# Patient Record
Sex: Female | Born: 1935 | Race: White | Hispanic: No | Smoking: Never smoker
Health system: Southern US, Community
[De-identification: ages and names within clinical notes are randomized; demographics above are authoritative.]

## PROBLEM LIST (undated history)

## (undated) DIAGNOSIS — H9190 Unspecified hearing loss, unspecified ear: Secondary | ICD-10-CM

## (undated) DIAGNOSIS — M1710 Unilateral primary osteoarthritis, unspecified knee: Secondary | ICD-10-CM

## (undated) DIAGNOSIS — IMO0002 Reserved for concepts with insufficient information to code with codable children: Secondary | ICD-10-CM

## (undated) DIAGNOSIS — G5600 Carpal tunnel syndrome, unspecified upper limb: Secondary | ICD-10-CM

## (undated) DIAGNOSIS — J4489 Other specified chronic obstructive pulmonary disease: Secondary | ICD-10-CM

## (undated) DIAGNOSIS — E039 Hypothyroidism, unspecified: Secondary | ICD-10-CM

## (undated) DIAGNOSIS — I5032 Chronic diastolic (congestive) heart failure: Secondary | ICD-10-CM

## (undated) DIAGNOSIS — S82402A Unspecified fracture of shaft of left fibula, initial encounter for closed fracture: Secondary | ICD-10-CM

## (undated) DIAGNOSIS — I10 Essential (primary) hypertension: Secondary | ICD-10-CM

## (undated) DIAGNOSIS — F0390 Unspecified dementia without behavioral disturbance: Secondary | ICD-10-CM

## (undated) DIAGNOSIS — K219 Gastro-esophageal reflux disease without esophagitis: Secondary | ICD-10-CM

## (undated) DIAGNOSIS — I495 Sick sinus syndrome: Secondary | ICD-10-CM

## (undated) DIAGNOSIS — I2510 Atherosclerotic heart disease of native coronary artery without angina pectoris: Secondary | ICD-10-CM

## (undated) DIAGNOSIS — I679 Cerebrovascular disease, unspecified: Secondary | ICD-10-CM

## (undated) DIAGNOSIS — E538 Deficiency of other specified B group vitamins: Secondary | ICD-10-CM

## (undated) DIAGNOSIS — G4730 Sleep apnea, unspecified: Secondary | ICD-10-CM

## (undated) DIAGNOSIS — G629 Polyneuropathy, unspecified: Secondary | ICD-10-CM

## (undated) DIAGNOSIS — F411 Generalized anxiety disorder: Secondary | ICD-10-CM

## (undated) DIAGNOSIS — E785 Hyperlipidemia, unspecified: Secondary | ICD-10-CM

## (undated) DIAGNOSIS — J449 Chronic obstructive pulmonary disease, unspecified: Secondary | ICD-10-CM

## (undated) DIAGNOSIS — Z950 Presence of cardiac pacemaker: Secondary | ICD-10-CM

## (undated) HISTORY — DX: Sleep apnea, unspecified: G47.30

## (undated) HISTORY — PX: HEMORRHOID SURGERY: SHX153

## (undated) HISTORY — DX: Essential (primary) hypertension: I10

## (undated) HISTORY — DX: Hyperlipidemia, unspecified: E78.5

## (undated) HISTORY — DX: Cerebrovascular disease, unspecified: I67.9

## (undated) HISTORY — PX: BLADDER REPAIR: SHX76

## (undated) HISTORY — DX: Chronic diastolic (congestive) heart failure: I50.32

## (undated) HISTORY — DX: Hypothyroidism, unspecified: E03.9

## (undated) HISTORY — DX: Unilateral primary osteoarthritis, unspecified knee: M17.10

## (undated) HISTORY — DX: Atherosclerotic heart disease of native coronary artery without angina pectoris: I25.10

## (undated) HISTORY — DX: Reserved for concepts with insufficient information to code with codable children: IMO0002

## (undated) HISTORY — DX: Chronic obstructive pulmonary disease, unspecified: J44.9

## (undated) HISTORY — DX: Gastro-esophageal reflux disease without esophagitis: K21.9

## (undated) HISTORY — DX: Polyneuropathy, unspecified: G62.9

## (undated) HISTORY — PX: TOTAL KNEE ARTHROPLASTY: SHX125

## (undated) HISTORY — PX: ABDOMINAL HYSTERECTOMY: SHX81

## (undated) HISTORY — PX: NASAL FRACTURE SURGERY: SHX718

## (undated) HISTORY — PX: CARPAL TUNNEL RELEASE: SHX101

## (undated) HISTORY — DX: Deficiency of other specified B group vitamins: E53.8

## (undated) HISTORY — DX: Generalized anxiety disorder: F41.1

## (undated) HISTORY — DX: Other specified chronic obstructive pulmonary disease: J44.89

## (undated) HISTORY — DX: Unspecified fracture of shaft of left fibula, initial encounter for closed fracture: S82.402A

## (undated) HISTORY — DX: Carpal tunnel syndrome, unspecified upper limb: G56.00

## (undated) HISTORY — PX: CHOLECYSTECTOMY: SHX55

## (undated) HISTORY — DX: Sick sinus syndrome: I49.5

---

## 1978-06-11 HISTORY — PX: THYROIDECTOMY: SHX17

## 1998-02-16 ENCOUNTER — Inpatient Hospital Stay (HOSPITAL_COMMUNITY): Admission: EM | Admit: 1998-02-16 | Discharge: 1998-02-17 | Payer: Self-pay | Admitting: Emergency Medicine

## 2000-10-08 ENCOUNTER — Ambulatory Visit (HOSPITAL_COMMUNITY): Admission: RE | Admit: 2000-10-08 | Discharge: 2000-10-08 | Payer: Self-pay | Admitting: Cardiology

## 2000-11-26 ENCOUNTER — Ambulatory Visit (HOSPITAL_COMMUNITY): Admission: RE | Admit: 2000-11-26 | Discharge: 2000-11-26 | Payer: Self-pay | Admitting: Internal Medicine

## 2000-11-26 ENCOUNTER — Encounter: Payer: Self-pay | Admitting: Internal Medicine

## 2001-03-26 ENCOUNTER — Encounter: Admission: RE | Admit: 2001-03-26 | Discharge: 2001-04-09 | Payer: Self-pay | Admitting: *Deleted

## 2001-04-01 ENCOUNTER — Encounter (HOSPITAL_COMMUNITY): Admission: RE | Admit: 2001-04-01 | Discharge: 2001-05-01 | Payer: Self-pay | Admitting: Internal Medicine

## 2001-04-09 ENCOUNTER — Ambulatory Visit (HOSPITAL_COMMUNITY): Admission: RE | Admit: 2001-04-09 | Discharge: 2001-04-09 | Payer: Self-pay | Admitting: Cardiology

## 2001-04-15 ENCOUNTER — Encounter: Admission: RE | Admit: 2001-04-15 | Discharge: 2001-04-23 | Payer: Self-pay | Admitting: *Deleted

## 2001-06-03 ENCOUNTER — Encounter: Payer: Self-pay | Admitting: Emergency Medicine

## 2001-06-03 ENCOUNTER — Emergency Department (HOSPITAL_COMMUNITY): Admission: EM | Admit: 2001-06-03 | Discharge: 2001-06-03 | Payer: Self-pay | Admitting: Emergency Medicine

## 2001-06-10 ENCOUNTER — Ambulatory Visit (HOSPITAL_COMMUNITY): Admission: RE | Admit: 2001-06-10 | Discharge: 2001-06-10 | Payer: Self-pay | Admitting: Internal Medicine

## 2001-06-10 ENCOUNTER — Encounter: Payer: Self-pay | Admitting: Internal Medicine

## 2001-06-11 DIAGNOSIS — I495 Sick sinus syndrome: Secondary | ICD-10-CM

## 2001-06-11 HISTORY — DX: Sick sinus syndrome: I49.5

## 2001-06-11 HISTORY — PX: CARDIAC PACEMAKER PLACEMENT: SHX583

## 2001-08-05 ENCOUNTER — Ambulatory Visit (HOSPITAL_COMMUNITY): Admission: RE | Admit: 2001-08-05 | Discharge: 2001-08-05 | Payer: Self-pay | Admitting: Internal Medicine

## 2001-10-13 ENCOUNTER — Encounter: Payer: Self-pay | Admitting: Internal Medicine

## 2001-10-13 ENCOUNTER — Ambulatory Visit (HOSPITAL_COMMUNITY): Admission: RE | Admit: 2001-10-13 | Discharge: 2001-10-13 | Payer: Self-pay | Admitting: Internal Medicine

## 2001-12-24 ENCOUNTER — Encounter: Payer: Self-pay | Admitting: Internal Medicine

## 2001-12-24 ENCOUNTER — Ambulatory Visit (HOSPITAL_COMMUNITY): Admission: RE | Admit: 2001-12-24 | Discharge: 2001-12-25 | Payer: Self-pay | Admitting: *Deleted

## 2001-12-25 ENCOUNTER — Encounter: Payer: Self-pay | Admitting: Internal Medicine

## 2002-03-23 ENCOUNTER — Encounter: Payer: Self-pay | Admitting: Family Medicine

## 2002-03-23 ENCOUNTER — Ambulatory Visit (HOSPITAL_COMMUNITY): Admission: RE | Admit: 2002-03-23 | Discharge: 2002-03-23 | Payer: Self-pay | Admitting: Family Medicine

## 2002-06-24 ENCOUNTER — Encounter: Payer: Self-pay | Admitting: Internal Medicine

## 2002-06-24 ENCOUNTER — Ambulatory Visit (HOSPITAL_COMMUNITY): Admission: RE | Admit: 2002-06-24 | Discharge: 2002-06-24 | Payer: Self-pay | Admitting: Internal Medicine

## 2002-11-17 ENCOUNTER — Emergency Department (HOSPITAL_COMMUNITY): Admission: EM | Admit: 2002-11-17 | Discharge: 2002-11-17 | Payer: Self-pay | Admitting: Emergency Medicine

## 2003-01-28 ENCOUNTER — Encounter: Payer: Self-pay | Admitting: Family Medicine

## 2003-01-28 ENCOUNTER — Ambulatory Visit (HOSPITAL_COMMUNITY): Admission: RE | Admit: 2003-01-28 | Discharge: 2003-01-28 | Payer: Self-pay | Admitting: *Deleted

## 2003-02-19 ENCOUNTER — Ambulatory Visit (HOSPITAL_COMMUNITY): Admission: RE | Admit: 2003-02-19 | Discharge: 2003-02-19 | Payer: Self-pay | Admitting: Family Medicine

## 2003-02-19 ENCOUNTER — Encounter: Payer: Self-pay | Admitting: Family Medicine

## 2003-03-10 ENCOUNTER — Ambulatory Visit (HOSPITAL_COMMUNITY): Admission: RE | Admit: 2003-03-10 | Discharge: 2003-03-10 | Payer: Self-pay | Admitting: Internal Medicine

## 2003-08-16 ENCOUNTER — Emergency Department (HOSPITAL_COMMUNITY): Admission: EM | Admit: 2003-08-16 | Discharge: 2003-08-16 | Payer: Self-pay | Admitting: Emergency Medicine

## 2003-08-19 ENCOUNTER — Ambulatory Visit (HOSPITAL_COMMUNITY): Admission: RE | Admit: 2003-08-19 | Discharge: 2003-08-19 | Payer: Self-pay | Admitting: Family Medicine

## 2003-08-23 ENCOUNTER — Encounter (HOSPITAL_COMMUNITY): Admission: RE | Admit: 2003-08-23 | Discharge: 2003-09-22 | Payer: Self-pay | Admitting: Family Medicine

## 2003-10-19 ENCOUNTER — Ambulatory Visit (HOSPITAL_COMMUNITY): Admission: RE | Admit: 2003-10-19 | Discharge: 2003-10-19 | Payer: Self-pay | Admitting: Internal Medicine

## 2004-05-01 ENCOUNTER — Ambulatory Visit: Payer: Self-pay

## 2004-05-29 ENCOUNTER — Ambulatory Visit (HOSPITAL_COMMUNITY): Admission: RE | Admit: 2004-05-29 | Discharge: 2004-05-29 | Payer: Self-pay | Admitting: Internal Medicine

## 2004-07-02 ENCOUNTER — Emergency Department (HOSPITAL_COMMUNITY): Admission: EM | Admit: 2004-07-02 | Discharge: 2004-07-02 | Payer: Self-pay | Admitting: Emergency Medicine

## 2004-09-12 ENCOUNTER — Ambulatory Visit: Payer: Self-pay | Admitting: Internal Medicine

## 2004-11-06 ENCOUNTER — Ambulatory Visit (HOSPITAL_COMMUNITY): Admission: RE | Admit: 2004-11-06 | Discharge: 2004-11-06 | Payer: Self-pay | Admitting: Internal Medicine

## 2004-11-08 ENCOUNTER — Ambulatory Visit (HOSPITAL_COMMUNITY): Admission: RE | Admit: 2004-11-08 | Discharge: 2004-11-08 | Payer: Self-pay | Admitting: Internal Medicine

## 2004-12-28 ENCOUNTER — Ambulatory Visit: Payer: Self-pay | Admitting: Cardiology

## 2005-01-16 ENCOUNTER — Ambulatory Visit: Payer: Self-pay | Admitting: *Deleted

## 2005-02-07 ENCOUNTER — Emergency Department (HOSPITAL_COMMUNITY): Admission: EM | Admit: 2005-02-07 | Discharge: 2005-02-07 | Payer: Self-pay | Admitting: Emergency Medicine

## 2005-03-01 ENCOUNTER — Ambulatory Visit: Payer: Self-pay | Admitting: Cardiology

## 2005-03-30 ENCOUNTER — Ambulatory Visit (HOSPITAL_COMMUNITY): Admission: RE | Admit: 2005-03-30 | Discharge: 2005-03-30 | Payer: Self-pay | Admitting: Internal Medicine

## 2005-07-09 ENCOUNTER — Ambulatory Visit (HOSPITAL_COMMUNITY): Admission: RE | Admit: 2005-07-09 | Discharge: 2005-07-09 | Payer: Self-pay | Admitting: Internal Medicine

## 2005-07-30 ENCOUNTER — Ambulatory Visit (HOSPITAL_COMMUNITY): Admission: RE | Admit: 2005-07-30 | Discharge: 2005-07-30 | Payer: Self-pay | Admitting: Internal Medicine

## 2005-10-18 ENCOUNTER — Ambulatory Visit: Payer: Self-pay | Admitting: Cardiology

## 2005-10-25 ENCOUNTER — Ambulatory Visit (HOSPITAL_COMMUNITY): Admission: RE | Admit: 2005-10-25 | Discharge: 2005-10-25 | Payer: Self-pay | Admitting: Internal Medicine

## 2005-11-28 ENCOUNTER — Ambulatory Visit: Payer: Self-pay | Admitting: Orthopedic Surgery

## 2005-12-27 ENCOUNTER — Ambulatory Visit: Payer: Self-pay | Admitting: Orthopedic Surgery

## 2006-02-06 ENCOUNTER — Ambulatory Visit: Payer: Self-pay | Admitting: Orthopedic Surgery

## 2006-02-19 ENCOUNTER — Ambulatory Visit: Payer: Self-pay | Admitting: Internal Medicine

## 2006-04-23 ENCOUNTER — Ambulatory Visit (HOSPITAL_COMMUNITY): Admission: RE | Admit: 2006-04-23 | Discharge: 2006-04-23 | Payer: Self-pay | Admitting: Internal Medicine

## 2006-05-15 ENCOUNTER — Ambulatory Visit: Payer: Self-pay | Admitting: Internal Medicine

## 2006-06-10 ENCOUNTER — Ambulatory Visit: Payer: Self-pay | Admitting: Orthopedic Surgery

## 2006-06-11 HISTORY — PX: COLONOSCOPY: SHX174

## 2006-07-15 ENCOUNTER — Ambulatory Visit (HOSPITAL_COMMUNITY): Admission: RE | Admit: 2006-07-15 | Discharge: 2006-07-15 | Payer: Self-pay | Admitting: Internal Medicine

## 2006-08-14 ENCOUNTER — Ambulatory Visit: Payer: Self-pay | Admitting: Internal Medicine

## 2006-12-16 ENCOUNTER — Emergency Department (HOSPITAL_COMMUNITY): Admission: EM | Admit: 2006-12-16 | Discharge: 2006-12-16 | Payer: Self-pay | Admitting: Emergency Medicine

## 2006-12-17 ENCOUNTER — Ambulatory Visit: Payer: Self-pay | Admitting: Orthopedic Surgery

## 2006-12-25 ENCOUNTER — Ambulatory Visit: Payer: Self-pay | Admitting: Orthopedic Surgery

## 2007-03-11 ENCOUNTER — Ambulatory Visit: Payer: Self-pay | Admitting: Internal Medicine

## 2007-05-13 ENCOUNTER — Ambulatory Visit: Payer: Self-pay | Admitting: Gastroenterology

## 2007-05-26 ENCOUNTER — Ambulatory Visit (HOSPITAL_COMMUNITY): Admission: RE | Admit: 2007-05-26 | Discharge: 2007-05-26 | Payer: Self-pay | Admitting: Gastroenterology

## 2007-05-26 ENCOUNTER — Ambulatory Visit: Payer: Self-pay | Admitting: Gastroenterology

## 2007-05-26 ENCOUNTER — Encounter: Payer: Self-pay | Admitting: Gastroenterology

## 2007-05-30 ENCOUNTER — Ambulatory Visit: Payer: Self-pay | Admitting: Gastroenterology

## 2007-06-11 ENCOUNTER — Ambulatory Visit: Payer: Self-pay | Admitting: Internal Medicine

## 2007-07-01 ENCOUNTER — Ambulatory Visit: Payer: Self-pay | Admitting: Orthopedic Surgery

## 2007-07-01 DIAGNOSIS — M7580 Other shoulder lesions, unspecified shoulder: Secondary | ICD-10-CM

## 2007-07-01 DIAGNOSIS — M25819 Other specified joint disorders, unspecified shoulder: Secondary | ICD-10-CM | POA: Insufficient documentation

## 2007-07-03 ENCOUNTER — Ambulatory Visit: Payer: Self-pay | Admitting: Gastroenterology

## 2007-07-04 ENCOUNTER — Ambulatory Visit (HOSPITAL_COMMUNITY): Admission: RE | Admit: 2007-07-04 | Discharge: 2007-07-04 | Payer: Self-pay | Admitting: Gastroenterology

## 2007-07-08 ENCOUNTER — Ambulatory Visit: Payer: Self-pay | Admitting: Cardiology

## 2007-07-22 ENCOUNTER — Ambulatory Visit (HOSPITAL_COMMUNITY): Admission: RE | Admit: 2007-07-22 | Discharge: 2007-07-22 | Payer: Self-pay | Admitting: Internal Medicine

## 2007-08-20 ENCOUNTER — Encounter (HOSPITAL_COMMUNITY): Admission: RE | Admit: 2007-08-20 | Discharge: 2007-09-19 | Payer: Self-pay | Admitting: Internal Medicine

## 2007-09-18 ENCOUNTER — Ambulatory Visit: Payer: Self-pay | Admitting: Internal Medicine

## 2007-09-22 ENCOUNTER — Ambulatory Visit: Admission: RE | Admit: 2007-09-22 | Discharge: 2007-09-22 | Payer: Self-pay | Admitting: Internal Medicine

## 2007-10-03 ENCOUNTER — Ambulatory Visit: Payer: Self-pay | Admitting: Internal Medicine

## 2007-11-25 ENCOUNTER — Encounter: Payer: Self-pay | Admitting: Internal Medicine

## 2007-12-17 ENCOUNTER — Ambulatory Visit: Payer: Self-pay | Admitting: Internal Medicine

## 2008-03-03 ENCOUNTER — Ambulatory Visit: Payer: Self-pay

## 2008-06-02 ENCOUNTER — Ambulatory Visit: Payer: Self-pay | Admitting: Internal Medicine

## 2008-07-12 ENCOUNTER — Ambulatory Visit: Payer: Self-pay | Admitting: Cardiology

## 2008-07-21 ENCOUNTER — Encounter (INDEPENDENT_AMBULATORY_CARE_PROVIDER_SITE_OTHER): Payer: Self-pay | Admitting: *Deleted

## 2008-07-21 LAB — CONVERTED CEMR LAB
Albumin: 4.4 g/dL
Alkaline Phosphatase: 83 units/L
BUN: 14 mg/dL
Calcium: 9.7 mg/dL
Cholesterol: 167 mg/dL
Creatinine, Ser: 0.81 mg/dL
HDL: 46 mg/dL
Triglycerides: 232 mg/dL

## 2008-07-23 ENCOUNTER — Ambulatory Visit (HOSPITAL_COMMUNITY): Admission: RE | Admit: 2008-07-23 | Discharge: 2008-07-23 | Payer: Self-pay | Admitting: Internal Medicine

## 2008-07-23 ENCOUNTER — Ambulatory Visit: Payer: Self-pay | Admitting: Cardiology

## 2008-07-23 ENCOUNTER — Encounter (HOSPITAL_COMMUNITY): Admission: RE | Admit: 2008-07-23 | Discharge: 2008-08-22 | Payer: Self-pay | Admitting: Cardiology

## 2008-08-30 ENCOUNTER — Ambulatory Visit (HOSPITAL_COMMUNITY): Admission: RE | Admit: 2008-08-30 | Discharge: 2008-08-30 | Payer: Self-pay | Admitting: Family Medicine

## 2008-09-01 ENCOUNTER — Ambulatory Visit: Payer: Self-pay | Admitting: Internal Medicine

## 2008-09-24 ENCOUNTER — Encounter (INDEPENDENT_AMBULATORY_CARE_PROVIDER_SITE_OTHER): Payer: Self-pay

## 2008-10-04 ENCOUNTER — Encounter: Payer: Self-pay | Admitting: Gastroenterology

## 2008-10-14 ENCOUNTER — Emergency Department (HOSPITAL_COMMUNITY): Admission: EM | Admit: 2008-10-14 | Discharge: 2008-10-14 | Payer: Self-pay | Admitting: Emergency Medicine

## 2008-12-07 ENCOUNTER — Encounter (INDEPENDENT_AMBULATORY_CARE_PROVIDER_SITE_OTHER): Payer: Self-pay | Admitting: *Deleted

## 2008-12-08 ENCOUNTER — Encounter: Payer: Self-pay | Admitting: Internal Medicine

## 2008-12-15 ENCOUNTER — Encounter: Payer: Self-pay | Admitting: Internal Medicine

## 2008-12-15 ENCOUNTER — Ambulatory Visit: Payer: Self-pay | Admitting: Internal Medicine

## 2008-12-15 DIAGNOSIS — Z950 Presence of cardiac pacemaker: Secondary | ICD-10-CM | POA: Insufficient documentation

## 2008-12-15 DIAGNOSIS — I5032 Chronic diastolic (congestive) heart failure: Secondary | ICD-10-CM | POA: Insufficient documentation

## 2009-01-17 ENCOUNTER — Encounter: Payer: Self-pay | Admitting: Gastroenterology

## 2009-02-17 DIAGNOSIS — K589 Irritable bowel syndrome without diarrhea: Secondary | ICD-10-CM | POA: Insufficient documentation

## 2009-02-17 DIAGNOSIS — E039 Hypothyroidism, unspecified: Secondary | ICD-10-CM | POA: Insufficient documentation

## 2009-02-17 DIAGNOSIS — J449 Chronic obstructive pulmonary disease, unspecified: Secondary | ICD-10-CM | POA: Insufficient documentation

## 2009-02-17 DIAGNOSIS — Z8719 Personal history of other diseases of the digestive system: Secondary | ICD-10-CM | POA: Insufficient documentation

## 2009-02-17 DIAGNOSIS — K449 Diaphragmatic hernia without obstruction or gangrene: Secondary | ICD-10-CM | POA: Insufficient documentation

## 2009-02-22 ENCOUNTER — Ambulatory Visit: Payer: Self-pay | Admitting: Gastroenterology

## 2009-02-22 ENCOUNTER — Encounter: Payer: Self-pay | Admitting: Urgent Care

## 2009-02-22 DIAGNOSIS — Z860100 Personal history of colon polyps, unspecified: Secondary | ICD-10-CM | POA: Insufficient documentation

## 2009-02-24 LAB — CONVERTED CEMR LAB
ALT: 16 units/L (ref 0–35)
AST: 25 units/L (ref 0–37)
Albumin: 4.3 g/dL (ref 3.5–5.2)
Basophils Absolute: 0 10*3/uL (ref 0.0–0.1)
Basophils Relative: 0 % (ref 0–1)
CO2: 24 meq/L (ref 19–32)
Chloride: 103 meq/L (ref 96–112)
Eosinophils Absolute: 0.2 10*3/uL (ref 0.0–0.7)
Eosinophils Relative: 2 % (ref 0–5)
HCT: 41.7 % (ref 36.0–46.0)
Lymphs Abs: 3 10*3/uL (ref 0.7–4.0)
MCHC: 32.1 g/dL (ref 30.0–36.0)
MCV: 91.4 fL (ref 78.0–100.0)
Monocytes Absolute: 0.4 10*3/uL (ref 0.1–1.0)
Platelets: 252 10*3/uL (ref 150–400)
RDW: 13.1 % (ref 11.5–15.5)
Sodium: 140 meq/L (ref 135–145)
Total Bilirubin: 0.6 mg/dL (ref 0.3–1.2)
WBC: 6.4 10*3/uL (ref 4.0–10.5)

## 2009-03-23 ENCOUNTER — Telehealth (INDEPENDENT_AMBULATORY_CARE_PROVIDER_SITE_OTHER): Payer: Self-pay

## 2009-04-06 ENCOUNTER — Ambulatory Visit: Payer: Self-pay | Admitting: Gastroenterology

## 2009-04-06 ENCOUNTER — Telehealth: Payer: Self-pay | Admitting: Gastroenterology

## 2009-04-08 DIAGNOSIS — R5381 Other malaise: Secondary | ICD-10-CM | POA: Insufficient documentation

## 2009-04-08 DIAGNOSIS — R5383 Other fatigue: Secondary | ICD-10-CM | POA: Insufficient documentation

## 2009-04-08 LAB — CONVERTED CEMR LAB
Basophils Absolute: 0 10*3/uL (ref 0.0–0.1)
Basophils Relative: 0 % (ref 0–1)
Eosinophils Absolute: 0.2 10*3/uL (ref 0.0–0.7)
Eosinophils Relative: 3 % (ref 0–5)
HCT: 41.8 % (ref 36.0–46.0)
Hemoglobin: 14.2 g/dL (ref 12.0–15.0)
Lymphocytes Relative: 48 % — ABNORMAL HIGH (ref 12–46)
Lymphs Abs: 2.8 10*3/uL (ref 0.7–4.0)
MCHC: 34 g/dL (ref 30.0–36.0)
MCV: 90.5 fL (ref 78.0–100.0)
Monocytes Absolute: 0.4 10*3/uL (ref 0.1–1.0)
Monocytes Relative: 6 % (ref 3–12)
Neutro Abs: 2.5 10*3/uL (ref 1.7–7.7)
Neutrophils Relative %: 43 % (ref 43–77)
Platelets: 201 10*3/uL (ref 150–400)
RBC: 4.62 M/uL (ref 3.87–5.11)
RDW: 12.8 % (ref 11.5–15.5)
WBC: 5.9 10*3/uL (ref 4.0–10.5)

## 2009-04-12 ENCOUNTER — Ambulatory Visit: Payer: Self-pay | Admitting: Cardiology

## 2009-04-12 ENCOUNTER — Encounter: Payer: Self-pay | Admitting: Internal Medicine

## 2009-04-19 ENCOUNTER — Encounter: Payer: Self-pay | Admitting: Urgent Care

## 2009-04-28 ENCOUNTER — Encounter (INDEPENDENT_AMBULATORY_CARE_PROVIDER_SITE_OTHER): Payer: Self-pay | Admitting: *Deleted

## 2009-05-17 ENCOUNTER — Emergency Department (HOSPITAL_COMMUNITY): Admission: EM | Admit: 2009-05-17 | Discharge: 2009-05-17 | Payer: Self-pay | Admitting: Emergency Medicine

## 2009-07-25 ENCOUNTER — Ambulatory Visit (HOSPITAL_COMMUNITY): Admission: RE | Admit: 2009-07-25 | Discharge: 2009-07-25 | Payer: Self-pay | Admitting: Internal Medicine

## 2009-07-29 ENCOUNTER — Ambulatory Visit: Payer: Self-pay | Admitting: Cardiovascular Disease

## 2009-07-29 ENCOUNTER — Encounter: Payer: Self-pay | Admitting: Internal Medicine

## 2009-08-29 ENCOUNTER — Ambulatory Visit: Payer: Self-pay | Admitting: Internal Medicine

## 2009-08-29 DIAGNOSIS — E1159 Type 2 diabetes mellitus with other circulatory complications: Secondary | ICD-10-CM

## 2009-08-29 DIAGNOSIS — M1710 Unilateral primary osteoarthritis, unspecified knee: Secondary | ICD-10-CM

## 2009-08-29 DIAGNOSIS — E785 Hyperlipidemia, unspecified: Secondary | ICD-10-CM | POA: Insufficient documentation

## 2009-08-29 DIAGNOSIS — I10 Essential (primary) hypertension: Secondary | ICD-10-CM

## 2009-08-29 DIAGNOSIS — I152 Hypertension secondary to endocrine disorders: Secondary | ICD-10-CM | POA: Insufficient documentation

## 2009-08-29 DIAGNOSIS — IMO0002 Reserved for concepts with insufficient information to code with codable children: Secondary | ICD-10-CM | POA: Insufficient documentation

## 2009-08-29 LAB — CONVERTED CEMR LAB
Bilirubin Urine: NEGATIVE
Creatinine,U: 39.8 mg/dL
HDL: 50.4 mg/dL (ref >39.00)
Ketones, ur: NEGATIVE mg/dL
Microalb Creat Ratio: 67.8 mg/g — ABNORMAL HIGH (ref 0.0–30.0)
Nitrite: NEGATIVE
Specific Gravity, Urine: <=1.005 (ref 1.000–1.030)
Total CHOL/HDL Ratio: 3
Urine Glucose: NEGATIVE mg/dL
VLDL: 36.2 mg/dL (ref 0.0–40.0)
pH: 6 (ref 5.0–8.0)

## 2009-08-30 ENCOUNTER — Telehealth: Payer: Self-pay | Admitting: Internal Medicine

## 2009-09-05 ENCOUNTER — Telehealth: Payer: Self-pay | Admitting: Internal Medicine

## 2009-09-06 ENCOUNTER — Telehealth: Payer: Self-pay | Admitting: Internal Medicine

## 2009-09-07 ENCOUNTER — Encounter (INDEPENDENT_AMBULATORY_CARE_PROVIDER_SITE_OTHER): Payer: Self-pay | Admitting: *Deleted

## 2009-09-15 ENCOUNTER — Ambulatory Visit: Payer: Self-pay | Admitting: Cardiology

## 2009-09-15 DIAGNOSIS — I679 Cerebrovascular disease, unspecified: Secondary | ICD-10-CM | POA: Insufficient documentation

## 2009-09-15 DIAGNOSIS — I2510 Atherosclerotic heart disease of native coronary artery without angina pectoris: Secondary | ICD-10-CM | POA: Insufficient documentation

## 2009-09-16 ENCOUNTER — Encounter: Payer: Self-pay | Admitting: Cardiology

## 2009-09-16 ENCOUNTER — Telehealth: Payer: Self-pay | Admitting: Cardiology

## 2009-09-23 ENCOUNTER — Ambulatory Visit (HOSPITAL_COMMUNITY): Admission: RE | Admit: 2009-09-23 | Discharge: 2009-09-23 | Payer: Self-pay | Admitting: Cardiology

## 2009-10-03 ENCOUNTER — Telehealth: Payer: Self-pay | Admitting: Cardiology

## 2009-10-03 ENCOUNTER — Telehealth: Payer: Self-pay | Admitting: Internal Medicine

## 2009-10-06 ENCOUNTER — Telehealth: Payer: Self-pay | Admitting: Internal Medicine

## 2009-10-10 ENCOUNTER — Telehealth: Payer: Self-pay | Admitting: Internal Medicine

## 2009-10-11 ENCOUNTER — Ambulatory Visit: Payer: Self-pay | Admitting: Internal Medicine

## 2009-10-11 ENCOUNTER — Encounter (INDEPENDENT_AMBULATORY_CARE_PROVIDER_SITE_OTHER): Payer: Self-pay | Admitting: *Deleted

## 2009-10-11 ENCOUNTER — Telehealth: Payer: Self-pay | Admitting: Internal Medicine

## 2009-10-11 DIAGNOSIS — E538 Deficiency of other specified B group vitamins: Secondary | ICD-10-CM | POA: Insufficient documentation

## 2009-10-11 DIAGNOSIS — M545 Low back pain, unspecified: Secondary | ICD-10-CM | POA: Insufficient documentation

## 2009-10-11 LAB — CONVERTED CEMR LAB
ALT: 27 units/L (ref 0–35)
AST: 36 units/L
AST: 36 units/L (ref 0–37)
Albumin: 4 g/dL
Albumin: 4 g/dL (ref 3.5–5.2)
Alkaline Phosphatase: 87 units/L
BUN: 6 mg/dL
Basophils Absolute: 0 10*3/uL (ref 0.0–0.1)
Bilirubin Urine: NEGATIVE
Bilirubin, Direct: 0.2 mg/dL (ref 0.0–0.3)
CO2: 33 meq/L
CO2: 33 meq/L — ABNORMAL HIGH (ref 19–32)
Calcium: 9.5 mg/dL
Chloride: 99 meq/L
Chloride: 99 meq/L (ref 96–112)
Creatinine, Ser: 0.8 mg/dL
Folate: >20 ng/mL
Folate: >20 ng/mL
GFR calc non Af Amer: 74.34 mL/min
HCT: 41 % (ref 36.0–46.0)
Hemoglobin: 14.1 g/dL
Hemoglobin: 14.1 g/dL (ref 12.0–15.0)
Ketones, ur: NEGATIVE mg/dL
MCHC: 34.3 g/dL (ref 30.0–36.0)
MCV: 90.6 fL
Monocytes Absolute: 0.5 10*3/uL (ref 0.1–1.0)
Monocytes Relative: 7.3 % (ref 3.0–12.0)
Neutrophils Relative %: 55.5 % (ref 43.0–77.0)
Platelets: 254 10*3/uL
RBC: 4.53 M/uL (ref 3.87–5.11)
RDW: 12.5 % (ref 11.5–14.6)
Specific Gravity, Urine: <=1.005 (ref 1.000–1.030)
Total Bilirubin: 0.5 mg/dL (ref 0.3–1.2)
Total Protein, Urine: NEGATIVE mg/dL
Urine Glucose: NEGATIVE mg/dL
WBC: 7 10*3/uL
WBC: 7 10*3/uL (ref 4.5–10.5)

## 2009-10-25 ENCOUNTER — Encounter: Payer: Self-pay | Admitting: Internal Medicine

## 2009-10-27 ENCOUNTER — Ambulatory Visit: Payer: Self-pay | Admitting: Internal Medicine

## 2009-10-27 LAB — CONVERTED CEMR LAB: Blood Glucose, Fingerstick: 179

## 2009-10-31 ENCOUNTER — Telehealth: Payer: Self-pay | Admitting: Internal Medicine

## 2009-10-31 ENCOUNTER — Ambulatory Visit: Payer: Self-pay | Admitting: Cardiology

## 2009-11-02 ENCOUNTER — Telehealth: Payer: Self-pay | Admitting: Internal Medicine

## 2009-11-03 ENCOUNTER — Telehealth: Payer: Self-pay | Admitting: Internal Medicine

## 2009-11-04 ENCOUNTER — Encounter: Payer: Self-pay | Admitting: Internal Medicine

## 2009-11-10 ENCOUNTER — Telehealth: Payer: Self-pay | Admitting: Internal Medicine

## 2009-11-15 ENCOUNTER — Encounter (INDEPENDENT_AMBULATORY_CARE_PROVIDER_SITE_OTHER): Payer: Self-pay | Admitting: *Deleted

## 2009-11-21 ENCOUNTER — Encounter: Payer: Self-pay | Admitting: Internal Medicine

## 2009-11-22 ENCOUNTER — Ambulatory Visit: Payer: Self-pay | Admitting: Cardiology

## 2009-11-24 ENCOUNTER — Ambulatory Visit: Payer: Self-pay | Admitting: Internal Medicine

## 2009-11-24 ENCOUNTER — Telehealth: Payer: Self-pay | Admitting: Internal Medicine

## 2009-11-25 ENCOUNTER — Telehealth: Payer: Self-pay | Admitting: Internal Medicine

## 2009-12-05 ENCOUNTER — Telehealth: Payer: Self-pay | Admitting: Internal Medicine

## 2009-12-07 ENCOUNTER — Encounter: Payer: Self-pay | Admitting: Internal Medicine

## 2009-12-07 ENCOUNTER — Ambulatory Visit: Payer: Self-pay | Admitting: Internal Medicine

## 2009-12-07 ENCOUNTER — Telehealth: Payer: Self-pay | Admitting: Internal Medicine

## 2009-12-07 DIAGNOSIS — E782 Mixed hyperlipidemia: Secondary | ICD-10-CM | POA: Insufficient documentation

## 2009-12-08 ENCOUNTER — Encounter: Payer: Self-pay | Admitting: Internal Medicine

## 2009-12-08 ENCOUNTER — Telehealth (INDEPENDENT_AMBULATORY_CARE_PROVIDER_SITE_OTHER): Payer: Self-pay | Admitting: *Deleted

## 2009-12-08 LAB — CONVERTED CEMR LAB
TSH: 1.833 microintl units/mL (ref 0.350–4.500)
Total CK: 57 units/L (ref 7–177)

## 2009-12-10 ENCOUNTER — Observation Stay (HOSPITAL_COMMUNITY): Admission: EM | Admit: 2009-12-10 | Discharge: 2009-12-11 | Payer: Self-pay | Admitting: Emergency Medicine

## 2009-12-11 ENCOUNTER — Encounter: Payer: Self-pay | Admitting: Internal Medicine

## 2009-12-11 LAB — CONVERTED CEMR LAB
BUN: 8 mg/dL
CO2: 26 meq/L
Chloride: 105 meq/L
Cholesterol: 144 mg/dL
Creatinine, Ser: 0.74 mg/dL
Hgb A1c MFr Bld: 7.4 %
Potassium: 4 meq/L
RBC: 4.05 M/uL
Sodium: 139 meq/L
TSH: 1.945 microintl units/mL
Triglyceride fasting, serum: 101 mg/dL
WBC: 4.4 10*3/uL

## 2009-12-15 ENCOUNTER — Telehealth: Payer: Self-pay | Admitting: Internal Medicine

## 2009-12-18 ENCOUNTER — Emergency Department (HOSPITAL_COMMUNITY): Admission: EM | Admit: 2009-12-18 | Discharge: 2009-12-18 | Payer: Self-pay | Admitting: Emergency Medicine

## 2009-12-19 ENCOUNTER — Telehealth (INDEPENDENT_AMBULATORY_CARE_PROVIDER_SITE_OTHER): Payer: Self-pay | Admitting: *Deleted

## 2009-12-25 ENCOUNTER — Encounter: Payer: Self-pay | Admitting: Internal Medicine

## 2009-12-25 ENCOUNTER — Emergency Department (HOSPITAL_COMMUNITY): Admission: EM | Admit: 2009-12-25 | Discharge: 2009-12-26 | Payer: Self-pay | Admitting: Emergency Medicine

## 2009-12-25 LAB — CONVERTED CEMR LAB
CO2: 25 meq/L
Calcium: 10 mg/dL
Chloride: 103 meq/L
Eosinophils Relative: 2 %
HCT: 41 %
Lymphocytes, automated: 40 %
MCV: 92.8 fL
Monocytes Relative: 9 %
Neutrophils Relative %: 49 %
Potassium: 4.1 meq/L
RDW: 13.4 %

## 2009-12-26 ENCOUNTER — Ambulatory Visit: Payer: Self-pay | Admitting: Internal Medicine

## 2009-12-26 DIAGNOSIS — E1140 Type 2 diabetes mellitus with diabetic neuropathy, unspecified: Secondary | ICD-10-CM | POA: Insufficient documentation

## 2009-12-26 DIAGNOSIS — M129 Arthropathy, unspecified: Secondary | ICD-10-CM | POA: Insufficient documentation

## 2009-12-26 DIAGNOSIS — G5600 Carpal tunnel syndrome, unspecified upper limb: Secondary | ICD-10-CM | POA: Insufficient documentation

## 2009-12-27 ENCOUNTER — Encounter (INDEPENDENT_AMBULATORY_CARE_PROVIDER_SITE_OTHER): Payer: Self-pay | Admitting: *Deleted

## 2009-12-30 ENCOUNTER — Ambulatory Visit: Payer: Self-pay | Admitting: Internal Medicine

## 2009-12-30 DIAGNOSIS — R300 Dysuria: Secondary | ICD-10-CM | POA: Insufficient documentation

## 2009-12-30 DIAGNOSIS — F411 Generalized anxiety disorder: Secondary | ICD-10-CM | POA: Insufficient documentation

## 2009-12-30 LAB — CONVERTED CEMR LAB
Basophils Absolute: 0 10*3/uL (ref 0.0–0.1)
Eosinophils Relative: 1.1 % (ref 0.0–5.0)
Hemoglobin: 13.7 g/dL (ref 12.0–15.0)
Ketones, ur: NEGATIVE mg/dL
Lymphocytes Relative: 30 % (ref 12.0–46.0)
Monocytes Relative: 5.9 % (ref 3.0–12.0)
Neutro Abs: 4.4 10*3/uL (ref 1.4–7.7)
Nitrite: NEGATIVE
RDW: 13.2 % (ref 11.5–14.6)
Specific Gravity, Urine: <=1.005 (ref 1.000–1.030)
Total Protein, Urine: NEGATIVE mg/dL
WBC: 7 10*3/uL (ref 4.5–10.5)
pH: 6 (ref 5.0–8.0)

## 2010-01-02 ENCOUNTER — Telehealth: Payer: Self-pay | Admitting: Internal Medicine

## 2010-01-06 ENCOUNTER — Encounter: Payer: Self-pay | Admitting: Internal Medicine

## 2010-01-16 ENCOUNTER — Ambulatory Visit: Payer: Self-pay | Admitting: Internal Medicine

## 2010-01-17 ENCOUNTER — Telehealth: Payer: Self-pay | Admitting: Internal Medicine

## 2010-01-24 ENCOUNTER — Telehealth: Payer: Self-pay | Admitting: Internal Medicine

## 2010-02-08 ENCOUNTER — Telehealth: Payer: Self-pay | Admitting: Internal Medicine

## 2010-02-09 ENCOUNTER — Ambulatory Visit: Payer: Self-pay | Admitting: Internal Medicine

## 2010-02-15 ENCOUNTER — Telehealth: Payer: Self-pay | Admitting: Internal Medicine

## 2010-02-22 ENCOUNTER — Telehealth: Payer: Self-pay | Admitting: Internal Medicine

## 2010-02-24 ENCOUNTER — Telehealth: Payer: Self-pay | Admitting: Internal Medicine

## 2010-02-28 ENCOUNTER — Ambulatory Visit: Payer: Self-pay | Admitting: Internal Medicine

## 2010-02-28 DIAGNOSIS — R10814 Left lower quadrant abdominal tenderness: Secondary | ICD-10-CM | POA: Insufficient documentation

## 2010-02-28 LAB — CONVERTED CEMR LAB
Bilirubin Urine: NEGATIVE
Ketones, urine, test strip: NEGATIVE
Protein, U semiquant: NEGATIVE
Urobilinogen, UA: 0.2

## 2010-03-01 ENCOUNTER — Telehealth: Payer: Self-pay | Admitting: Internal Medicine

## 2010-03-06 ENCOUNTER — Encounter: Payer: Self-pay | Admitting: Internal Medicine

## 2010-03-10 ENCOUNTER — Telehealth: Payer: Self-pay | Admitting: Internal Medicine

## 2010-03-10 DIAGNOSIS — N949 Unspecified condition associated with female genital organs and menstrual cycle: Secondary | ICD-10-CM | POA: Insufficient documentation

## 2010-03-17 ENCOUNTER — Ambulatory Visit: Payer: Self-pay | Admitting: Gastroenterology

## 2010-03-17 ENCOUNTER — Encounter (INDEPENDENT_AMBULATORY_CARE_PROVIDER_SITE_OTHER): Payer: Self-pay | Admitting: *Deleted

## 2010-03-17 LAB — CONVERTED CEMR LAB
BUN: 9 mg/dL (ref 6–23)
Creatinine, Ser: 0.9 mg/dL (ref 0.4–1.2)
Ketones, ur: NEGATIVE mg/dL
Specific Gravity, Urine: <=1.005 (ref 1.000–1.030)
Urobilinogen, UA: 0.2 (ref 0.0–1.0)

## 2010-03-20 ENCOUNTER — Ambulatory Visit: Payer: Self-pay | Admitting: Cardiology

## 2010-03-30 ENCOUNTER — Ambulatory Visit: Payer: Self-pay | Admitting: Family Medicine

## 2010-04-19 ENCOUNTER — Ambulatory Visit: Payer: Self-pay | Admitting: Internal Medicine

## 2010-04-19 DIAGNOSIS — M1710 Unilateral primary osteoarthritis, unspecified knee: Secondary | ICD-10-CM | POA: Insufficient documentation

## 2010-04-19 DIAGNOSIS — M25559 Pain in unspecified hip: Secondary | ICD-10-CM | POA: Insufficient documentation

## 2010-04-19 DIAGNOSIS — IMO0002 Reserved for concepts with insufficient information to code with codable children: Secondary | ICD-10-CM

## 2010-04-20 ENCOUNTER — Telehealth: Payer: Self-pay | Admitting: Internal Medicine

## 2010-04-21 ENCOUNTER — Telehealth: Payer: Self-pay | Admitting: Internal Medicine

## 2010-04-25 ENCOUNTER — Telehealth: Payer: Self-pay | Admitting: Internal Medicine

## 2010-05-08 ENCOUNTER — Ambulatory Visit: Payer: Self-pay | Admitting: Internal Medicine

## 2010-05-08 DIAGNOSIS — N390 Urinary tract infection, site not specified: Secondary | ICD-10-CM | POA: Insufficient documentation

## 2010-05-08 LAB — CONVERTED CEMR LAB
Ketones, urine, test strip: NEGATIVE
Nitrite: NEGATIVE
pH: 5

## 2010-05-16 ENCOUNTER — Ambulatory Visit: Payer: Self-pay | Admitting: Internal Medicine

## 2010-05-16 DIAGNOSIS — L2089 Other atopic dermatitis: Secondary | ICD-10-CM | POA: Insufficient documentation

## 2010-05-16 LAB — CONVERTED CEMR LAB
Glucose, Urine, Semiquant: NEGATIVE
Nitrite: POSITIVE
Protein, U semiquant: >=300
Specific Gravity, Urine: <1.005

## 2010-06-15 ENCOUNTER — Telehealth (INDEPENDENT_AMBULATORY_CARE_PROVIDER_SITE_OTHER): Payer: Self-pay | Admitting: *Deleted

## 2010-06-21 ENCOUNTER — Telehealth: Payer: Self-pay | Admitting: Internal Medicine

## 2010-06-21 ENCOUNTER — Telehealth (INDEPENDENT_AMBULATORY_CARE_PROVIDER_SITE_OTHER): Payer: Self-pay | Admitting: *Deleted

## 2010-06-23 ENCOUNTER — Telehealth (INDEPENDENT_AMBULATORY_CARE_PROVIDER_SITE_OTHER): Payer: Self-pay | Admitting: *Deleted

## 2010-06-26 ENCOUNTER — Encounter (INDEPENDENT_AMBULATORY_CARE_PROVIDER_SITE_OTHER): Payer: Self-pay | Admitting: *Deleted

## 2010-06-29 ENCOUNTER — Encounter: Payer: Self-pay | Admitting: Internal Medicine

## 2010-06-30 ENCOUNTER — Encounter: Payer: Self-pay | Admitting: Cardiology

## 2010-07-02 ENCOUNTER — Encounter: Payer: Self-pay | Admitting: Orthopaedic Surgery

## 2010-07-02 ENCOUNTER — Encounter: Payer: Self-pay | Admitting: Internal Medicine

## 2010-07-03 ENCOUNTER — Encounter: Payer: Self-pay | Admitting: Internal Medicine

## 2010-07-09 LAB — CONVERTED CEMR LAB
ALT: 17 units/L
Albumin: 4 g/dL
Alkaline Phosphatase: 86 units/L
BUN: 10 mg/dL
CO2: 21 meq/L
CO2: 25 meq/L
Calcium: 10.2 mg/dL
Creatinine, Ser: 0.81 mg/dL
Creatinine, Ser: 0.84 mg/dL
Eosinophils Relative: 2 %
Glucose, Bld: 112 mg/dL
Glucose, Bld: 136 mg/dL
LDL Cholesterol: 80 mg/dL
Lymphocytes, automated: 34 %
MCV: 91 fL
Potassium: 5 meq/L
Potassium: 5.6 meq/L
RDW: 13.6 %
Sodium: 140 meq/L
Triglyceride fasting, serum: 169 mg/dL

## 2010-07-13 ENCOUNTER — Encounter (INDEPENDENT_AMBULATORY_CARE_PROVIDER_SITE_OTHER): Payer: Self-pay | Admitting: *Deleted

## 2010-07-13 ENCOUNTER — Encounter: Payer: Self-pay | Admitting: Cardiology

## 2010-07-13 LAB — CONVERTED CEMR LAB
Albumin: 4.2 g/dL (ref 3.5–5.2)
CO2: 29 meq/L (ref 19–32)
Cholesterol: 219 mg/dL — ABNORMAL HIGH (ref 0–200)
Glucose, Bld: 174 mg/dL — ABNORMAL HIGH (ref 70–99)
LDL Cholesterol: 142 mg/dL — ABNORMAL HIGH (ref 0–99)
Total CHOL/HDL Ratio: 4.9
VLDL: 32 mg/dL (ref 0–40)

## 2010-07-13 NOTE — Progress Notes (Signed)
Summary: humulin n  Phone Note From Pharmacy Message from:  Fax from Pharmacy on February 08, 2010 12:34 PM  Caller: [facility] and [employer] of Call: Recieved fax stating patient needs 2 bottles per month on her humulin N. Pls verify dose & directions. Per chart should be taking Humulin N 10 units in am and 10 units in pm. will send update rx with correct quanity. Initial call taken by: Orlan Leavens RMA,  February 08, 2010 12:35 PM    Prescriptions: HUMULIN N 100 UNIT/ML SUSP (INSULIN ISOPHANE HUMAN) 10 units in the AM and 10 units in the  PM  #85ml x 5   Entered by:   Orlan Leavens RMA   Authorized by:   Newt Lukes MD   Signed by:   Orlan Leavens RMA on 02/08/2010   Method used:   Faxed to ...       Hospital doctor (retail)       [address]       [place], [place]  [address]       Ph: [phone] or [phone]       Fax: [phone]   RxID:   [id number]

## 2010-07-13 NOTE — Assessment & Plan Note (Signed)
Summary: 2 mth f/u per checkout on 09/15/09/tg   Visit Type:  Follow-up Primary Provider:  Newt Lukes MD   History of Present Illness: [name] returns to the office as scheduled for continued assessment and treatment of conduction system disease for which a dual-chamber pacemaker was placed 9 years ago, coronary disease and multiple cardiovascular risk factors.  Since her last visit 2 months ago, she has done quite well.  She reports generalized fatigue, a problem that is being addressed by Dr.  Felicity Coyer.  She has chronic mild dyspnea on exertion, but notes no chest discomfort, orthopnea, PND nor syncope.  She has had no abdominal discomfort, nausea, early satiety or other GI symptoms.  She is relatively inactive, but does housework without difficulty.  Current Medications (verified): 1)  Levoxyl 200 Mcg Tabs (Levothyroxine Sodium) .... Take 1 By Mouth Once Daily 2)  Levoxyl 25 Mcg Tabs (Levothyroxine Sodium) .... Take One Half Tablet in Addition The The 200 Micrograms Tablet 3)  Humulin N 100 Unit/ml Susp (Insulin Isophane Human) .Marland Kitchen.. 15 Units in The Am and 15 Units in The  Pm 4)  Nitrolingual 0.4 Mg/spray Soln (Nitroglycerin) .... Inhale 1 Spray As Directed Every Two Hours 5)  Metoprolol Succinate 50 Mg Xr24h-Tab (Metoprolol Succinate) .... Take 1/2 By Mouth Qd 6)  Diltiazem Hcl Er Beads 240 Mg Xr24h-Cap (Diltiazem Hcl Er Beads) .... Take One Capsule By Mouth Daily 7)  Aspirin 81 Mg Tbec (Aspirin) .... Take One Tablet By Mouth Daily 8)  Vitamin E 1000 Unit Caps (Vitamin E) .... Take 1 Tablet By Mouth Once A Day 9)  Furosemide 40 Mg Tabs (Furosemide) .Marland Kitchen.. 1 By Mouth Daily 10)  Gabapentin 300 Mg Caps (Gabapentin) .... Take 1 By Mouth Once Daily As Needed 11)  Alprazolam 0.5 Mg Tabs (Alprazolam) .... Take 1 Two Times A Day As Needed - To Fill June 2 , 2011 12)  Vitamin D3 1000 Unit Tabs (Cholecalciferol) .... Take 1 Cap Daily 13)  Proventil Hfa 108 (90 Base) Mcg/act Aers  (Albuterol Sulfate) .... Use As Needed 14)  Lotensin 20 Mg Tabs (Benazepril Hcl) .... Take 1 Tablet By Mouth Once A Day 15)  Omeprazole 20 Mg Cpdr (Omeprazole) .... Take 1 By Mouth Qd 16)  Cyanocobalamin 1000 Mcg/ml Soln (Cyanocobalamin) .... Take 1 Injection Im Q Month 17)  Bd Luer-Lok Syringe 23g X 1-1/4" 3 Ml Misc (Syringe/needle (Disp)) .... Use 1 Q Month To Dispense B12 18)  Actos 30 Mg Tabs (Pioglitazone Hcl) .... Take 1 Tablet By Mouth Once A Day 19)  Nystatin-Triamcinolone 100000-0.1 Unit/gm-% Crea (Nystatin-Triamcinolone) .... Apply To Affected Skin Three Times A Day X 7 Days, Then As Needed 20)  Benazepril Hcl 20 Mg Tabs (Benazepril Hcl) .... Take 1 Tab Daily 21)  Hydrocodone-Acetaminophen 5-500 Mg Tabs (Hydrocodone-Acetaminophen) .... Use As Needed 22)  Detrol La 4 Mg Xr24h-Cap (Tolterodine Tartrate) .... Take 1 Cap Daily  Allergies (verified): 1)  ! Penicillin  Past History:  PMH, FH, and Social History reviewed and updated.  Past Medical History: ASCVD: Cath in 9/99-70% mid LAD with diffuse distal disease, 70% T1, 60% mid circumflex,       50% mid RCA, normal ejection fraction; negative stress nuclear in 07/2008 CVD-carotid bruits; no focal disease in 1999, 2002 and 2006; 50-69% stenosis bilaterally in 09/2009 Conduction system disease-dual-chamber pacemaker implant ([employer]tr[employer])-2003 History of congestive heart failure with preserved LV systolic function Hypertension Hyperlipidemia   Diabetes, type II-insulin-dependent      Colonoscopy 2008 non-specific  colitis, IH, [place] Diverticulosis, RECTAL ULCER 2o to ASA      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 Dietrich Pates; EP-Dr. Ladona Ridgel GI -   Review of Systems       See history of present illness.  Vital Signs:  Patient profile:    77 year old female Weight:      269 pounds Pulse rate:   86 / minute BP sitting:   145 / 77  (right arm)  Vitals Entered By: Dreama Saa, CNA (November 22, 2009 10:46 AM)  Serial Vital Signs/Assessments:  Comments: [facility] Vitals: 11-01-09  96.8  76  18  114/62 11-03-09           62  16  142/70,   130/70, 60, 18 11-07-09           64, 20,  128/72 11-08-09   97.5, 77, 18, 128/66 By: Teressa Lower RN    Physical Exam  General:  Overweight; well developed; no acute distress:   Neck-No JVD; soft bilateral carotid bruits Lungs-No tachypnea, no rales; no rhonchi; no wheezes: Cardiovascular-normal PMI; normal S1 and normally split S2: Abdomen-BS normal; soft and non-tender without masses or organomegaly:  Musculoskeletal-No deformities, no cyanosis or clubbing: Neurologic-Normal cranial nerves; symmetric strength and tone:  Skin-Warm, no significant lesions: Extremities-Distal pulses intact; 1+ ankle edema:     PPM Specifications Following MD:  Lewayne Bunting, MD     PPM Vendor:  Medtronic     PPM Model Number:  [id number]     PPM Serial Number:  [id number] PPM DOI:  12/24/2001     PPM Implanting MD:  Lewayne Bunting, MD  Lead 1    Location: [facility]     DOI: 12/24/2001     Model #: [id number]     Serial #: [id number]     Status: active Lead 2    Location: [facility]     DOI: 12/24/2001     Model #: [id number]     Serial #: [id number]     Status: active  Magnet Response Rate:  BOL 85 ERI 65  Indications:  Pre-syncope; 2nd AVB   PPM Follow Up Pacer Dependent:  Yes      Episodes Coumadin:  No  Parameters Mode:  DDDR     Lower Rate Limit:  60     Upper Rate Limit:  130 Paced AV Delay:  250     Sensed AV Delay:  250  Impression & Recommendations:  Problem # 1:  ATHEROSCLEROTIC CARDIOVASCULAR DISEASE (ICD-429.2) No symptoms to suggest myocardial ischemia at the present time.  Management will focus on optimal treatment of risk factors.  Patient has a prescription for nitroglycerin, but has not  needed to use it for years if at all.  Problem # 2:  CARDIAC PACEMAKER IN SITU (ICD-V45.01) Device was last assessed 4 months ago and was found to be functioning normally.  Patient expects a generator change in the fairly near future, but I assured her that many of the systems are lasting more than 15 years.  She will continue to be followed by our EP service.  Problem # 3:  CHRONIC DIASTOLIC HEART FAILURE (ICD-428.32) This problem is adequately managed with current medication, which includes a fairly low dose of diuretic.  Problem # 4:  HYPERTENSION (ICD-401.9) Blood pressure control is somewhat suboptimal at this visit; however,  patient has had home health for the past few weeks.  Blood pressures taken by them have been reported to her as being good.  We will review those records to determine if additional antihypertensive medication is needed.  Systolic blood pressure should optimally be closer to 130.  Problem # 5:  HYPERLIPIDEMIA (ICD-272.4) Patient is not taking Crestor as advised byt her primary care physician, who is evaluating whether this medication could be contributing to fatigue.  I doubt that this is the problem.  Once a statin is resumed, simvastatin could be substituted, which would likely result in a significant savings for the patient.  Problem # 6:  DIABETES MELLITUS, TYPE II, ON INSULIN (ICD-250.00) Patient brought in a diary of blood sugars and for my review.  These have generally been fairly good with values around 150, but sometimes increased to near 200. Hemoglobin A1c was recently 7.2.  Slightly more intensive therapy could be considered.   Actos is not the ideal drug for this woman, but a trial off of it has already been performed.  There is no benefit in terms of pedal edema, but control of diabetes worsened.  The patient asked that it be resumed.  I am still concerned that agents in this class may all have adverse cardiovascular effects, but there is no prohibition against  using this drug in a diabetic with coronary disease.  Problem # 7:  FATIGUE (ICD-780.79) This is a nonspecific symptom that has been chronic.  Patient asked about diet pills, the use of which I discouraged.  I extolled the virtues of exercise and weight loss.    Metoprolol could be contributing to this problem, but at current dosage is minimal and not likely to be harmful.  Dr.  Felicity Coyer is pursuing appropriate avenues for now.  If unsuccessful, consideration can be given to a trial off of her low dose beta blocker.    Additional considerations include the patient's use of alprazolam, which she generally takes QHS rather than b.i.d., but in doses up to 1 mg.  I discussed with her the need to taper this and to ultimately try to use one half tablet p.r.n.   She is being treated with a very high dose of levothyroxine.  Even though the a recent TSH was within the normal range, I would recommend checking a free T3 and T4 level.  Recent basic lab tests were normal.  I will plan to see this nice woman again in one year.   a low your and ovary is normal in and now is in no and he is and he is in a little and it is a normal and a in a flu and a day and and a right and a full and was a healthy with a showed a and in her in her and in a in and a one and a is in an in and in an and in a right and in his feet and in her own is a a and a and he is in an in and in an in and he is a and in and he is in today and is a and is in her in a blade and an a and a wire  Patient Instructions: 1)  Your physician recommends that you schedule a follow-up appointment in: 1 year 2)  Your physician has recommended you make the following change in your medication:pt is to hold crestor until next office visit with Fr. Felicity Coyer Prescriptions: SIMVASTATIN 20 MG TABS (SIMVASTATIN)  Take one tablet by mouth daily at bedtime  #30 x 3   Entered by:   Teressa Lower RN   Authorized by:   Kathlen Brunswick, MD, [facility]   Signed by:   Teressa Lower RN on 11/22/2009   Method used:   Faxed to ...       Hospital doctor (retail)       [address]       [place], [place]  [address]       Ph: [phone] or [phone]       Fax: [phone]   RxID:   [id number]  Teressa Lower RN  November 22, 2009 1:46 PM  I called [facility] asked to disreqard orders for simvastatin

## 2010-07-13 NOTE — Progress Notes (Signed)
Summary: metoprolol & gabapentin  Phone Note From Pharmacy   Caller: [facility] Request: Speak with Provider, Resend Prescription Summary of Call: Pt states md increase metoprolol to 1 pill a day. Also pt states she is taking a gabapentin 300mg  once daily not as needed if correct pls send new rx to pharm on both meds. Dr. Felicity Coyer did you change any meds yesterday at ov? Initial call taken by: Orlan Leavens RMA,  January 17, 2010 10:37 AM  Follow-up for Phone Call        yes - metoprolol xr 50mg  was prev 1/2 tab once daily - changed to 1 whole tab once daily - may send new rx if needed - also may refill gabapentin as needed (i changed the sig to reflect scheduled med, not as needed) - EMR list is correct - may send any rx needed Follow-up by: Newt Lukes MD,  January 17, 2010 11:25 AM  Additional Follow-up for Phone Call Additional follow up Details #1::        SENT NEW RX'S TO [place] [facility] Additional Follow-up by: Orlan Leavens RMA,  January 17, 2010 11:32 AM    Prescriptions: GABAPENTIN 300 MG CAPS (GABAPENTIN) take 1 by mouth once daily  #30 x 5   Entered by:   Orlan Leavens RMA   Authorized by:   Newt Lukes MD   Signed by:   Orlan Leavens RMA on 01/17/2010   Method used:   Faxed to ...       Hospital doctor (retail)       [address]       [place], [place]  [address]       Ph: [phone] or [phone]       Fax: [phone]   RxID:   [id number] METOPROLOL SUCCINATE 50 MG XR24H-TAB (METOPROLOL SUCCINATE) take 1 by mouth once daily  #30 x 5   Entered by:   Orlan Leavens RMA   Authorized by:   Newt Lukes MD   Signed by:   Orlan Leavens RMA on 01/17/2010   Method used:   Faxed to ...       Hospital doctor (retail)       [address]       [place], [place]  [address]       Ph: [phone] or [phone]       Fax: [phone]   RxID:   [id number]

## 2010-07-13 NOTE — Progress Notes (Signed)
Summary: lab results  Phone Note Call from Patient Call back at Home Phone [phone]   Caller: Patient Reason for Call: Lab or Test Results Summary of Call: Pt called to get results from blood work. Please advise. Initial call taken by: Alysia Penna,  April 21, 2010 2:19 PM  Follow-up for Phone Call        a1c 7.4 - no new changes (we already increased insulin dose at her OV) - thanks Follow-up by: Newt Lukes MD,  April 21, 2010 3:38 PM  Additional Follow-up for Phone Call Additional follow up Details #1::        Pt advised and understood. Additional Follow-up by: Margaret Pyle, CMA,  April 21, 2010 3:54 PM

## 2010-07-13 NOTE — Miscellaneous (Signed)
Summary: [facility]  [facility]   Imported By: Sherian Rein 11/09/2009 13:48:42  _____________________________________________________________________  External Attachment:    Type:   Image     Comment:   External Document

## 2010-07-13 NOTE — Letter (Signed)
Summary: New Patient letter  [facility]  [address] [place], [place] [address]   Phone: [phone]  Fax: [phone]       03/17/2010 MRN: [id number]  [name] [name] [address] [place], [place]  [address]  Dear Ms. Clendenen,  Welcome to the Gastroenterology Division at [facility].    You are scheduled to see Dr.  Jarold Motto on 04-27-10 at 10:00a.m. on the 3rd floor at [place] [facility], [address]  We ask that you try to arrive at our office 15 minutes prior to your appointment time to allow for check-in.  We would like you to complete the enclosed self-administered evaluation form prior to your visit and bring it with you on the day of your appointment.  We will review it with you.  Also, please bring a complete list of all your medications or, if you prefer, bring the medication bottles and we will list them.  Please bring your insurance card so that we may make a copy of it.  If your insurance requires a referral to see a specialist, please bring your referral form from your primary care physician.  Co-payments are due at the time of your visit and may be paid by cash, check or credit card.     Your office visit will consist of a consult with your physician (includes a physical exam), any laboratory testing he/she may order, scheduling of any necessary diagnostic testing (e.g. x-ray, ultrasound, CT-scan), and scheduling of a procedure (e.g. Endoscopy, Colonoscopy) if required.  Please allow enough time on your schedule to allow for any/all of these possibilities.    If you cannot keep your appointment, please call [phone] to cancel or reschedule prior to your appointment date.  This allows [place] the opportunity to schedule an appointment for another patient in need of care.  If you do not cancel or reschedule by 5 p.m. the business day prior to your appointment date, you will be charged a $50.00 late cancellation/no-show fee.    Thank you for choosing  [facility] for your medical needs.  We appreciate the opportunity to care for you.  Please visit [place] at our website  to learn more about our practice.                     Sincerely,                                                             The Gastroenterology Division

## 2010-07-13 NOTE — Cardiovascular Report (Signed)
Summary: Office Visit   Office Visit   Imported By: Roderic Ovens 08/08/2009 12:39:15  _____________________________________________________________________  External Attachment:    Type:   Image     Comment:   External Document

## 2010-07-13 NOTE — Progress Notes (Signed)
Summary: pt wants put back on crestor  Phone Note Call from Patient Call back at Home Phone [phone]   Caller: pt Reason for Call: Talk to Nurse Summary of Call: Pt wants to restart Crestor. Initial call taken by: Faythe Ghee,  June 23, 2010 12:44 PM  Follow-up for Phone Call        S: pt would like to be put back on cholesterol medication because she has blockages and she is starting a strength training program at the [facility].  Needs a written consent B: last ov 11/2009, pt was to hold crestor until she was evaluated by Dr. Felicity Coyer.  She did not restart the medicaiton and no labwork was done. Pt has enrolled in a strength training class at the [facility] and will need a limitations,recommendations and medical history  A: last Lipid profile was 2010 ordered cmp and lipid profile for 08/03/2010, copy of this phone note attached to copy of [facility] Clearance form to refresh your memory and copy of pt's last office note. R: Follow-up by: Teressa Lower RN,  June 23, 2010 3:45 PM  Additional Follow-up for Phone Call Additional follow up Details #1::        Pravastatin 40 mg once daily FLP in 1 month Additional Follow-up by: Kathlen Brunswick, MD, [facility],  July 02, 2010 3:41 PM    Additional Follow-up for Phone Call Additional follow up Details #2::    left information on pts machine but also asked her to return call to discuss her lipid resutls Follow-up by: Teressa Lower RN,  July 04, 2010 8:37 AM  New/Updated Medications: PRAVASTATIN SODIUM 40 MG TABS (PRAVASTATIN SODIUM) Take one tablet by mouth daily at bedtime Prescriptions: PRAVASTATIN SODIUM 40 MG TABS (PRAVASTATIN SODIUM) Take one tablet by mouth daily at bedtime  #30 x 3   Entered by:   Teressa Lower RN   Authorized by:   Kathlen Brunswick, MD, [facility]   Signed by:   Teressa Lower RN on 07/04/2010   Method used:   Faxed to ...       Hospital doctor (retail)       [address]  [place], [place]  [address]       Ph: [phone] or [phone]       Fax: [phone]   RxID:   [id number]  spoke with pt, verbalized understanding of lab results, new med orders and future labwork

## 2010-07-13 NOTE — Medication Information (Signed)
Summary: Diabetic Supplies/[facility]  Diabetic Supplies/[employer] [facility]   Imported By: Lester Chattaroy 12/09/2009 09:54:59  _____________________________________________________________________  External Attachment:    Type:   Image     Comment:   External Document

## 2010-07-13 NOTE — Progress Notes (Signed)
Summary: Medications  Phone Note From Pharmacy   Reason for Call: Needs renewal Request: Speak with Nurse Details for Reason: Medications Summary of Call: Copy of faxed note sent to Dr. Diamantina Monks office.: Stu, pharmacist at [facility], states that Dr. Dietrich Pates and Dr. Felicity Coyer are both perscribing heart medications for pt. and that it is getting difficult for them and pt. to keep up with. Please send [place] a copy of most recent OV notes with Dr. Felicity Coyer. At this time, I am sending our last OV notes so we can compare meds. Also, we need to decide who will be prescibing heart medications. Thnk you. Initial call taken by: Larita Fife Via LPN,  February 15, 2010 10:14 AM  Follow-up for Phone Call        Spoke with member of Dr. Diamantina Monks staff about this matter. She states she will forward note to Dr. Felicity Coyer to decide which MD will handle pt' s heart meds.  Follow-up by: Larita Fife Via LPN,  February 17, 2010 11:53 AM  Additional Follow-up for Phone Call Additional follow up Details #1::        i do not see the problem (per se) - i have reviewed the notes - it seems each provider (myself and cards) changes meds if or as needed at time of pt's OV -pt herself drives much of the medication adjustment and change due to concerns about each medication - i will be happy to defer all rx re: "heart meds" which include BP meds to cards to try to simplify this process for the pt and her pharmacy. thanks for your help Additional Follow-up by: Newt Lukes MD,  February 17, 2010 12:37 PM    Additional Follow-up for Phone Call Additional follow up Details #2::    [name] appears to be well treated with a reasonable medical regime.  Since Dr.  Felicity Coyer and I share a chart, modification of her medical regime should not cause confusion nor render it difficult to determine which medications have been prescribed.  I see no reason for any significant change in our approach to treating her medical  problems.  Llano Grande Bing, M.D.  i agree - we'll  all continue to work with pt/pharmacy on this  Newt Lukes MD  February 23, 2010 5:37 AM

## 2010-07-13 NOTE — Assessment & Plan Note (Signed)
Summary: tired,#/cd   Vital Signs:  Patient profile:   77 year old female Height:      70 inches (177.80 cm) Weight:      272.4 pounds (123.82 kg) O2 Sat:      96 % on Room air Temp:     98.1 degrees F (36.72 degrees C) oral Pulse rate:   81 / minute BP sitting:   130 / 80  (left arm) Cuff size:   large  Vitals Entered By: Orlan Leavens (Oct 11, 2009 10:54 AM)  O2 Flow:  Room air CC: complaining og being tired/ no energy Is Patient Diabetic? Yes Did you bring your meter with you today? No Pain Assessment Patient in pain? no        Primary Care Provider:  Newt Lukes MD  CC:  complaining og being tired/ no energy.  History of Present Illness: c/o feeling fatigued onset 3-4 months ago, progressive course s/p recent tx for UTI - c/o continued left flank pain "makes it hard to get moving" no fever or hematuria no falls, no muscle weakness - no radiating pain or numbness - denies depression or sleep problems no medicaton changes recently - all reviewed today including vitmains  also reviewed other med issues: 1) HTN - reports compliance with ongoing medical treatment and no changes in medication dose or frequency. denies adverse side effects related to current therapy.  ?"why taking 3 medications for same thing"  2) hypothyroid - reports compliance with ongoing medical treatment and no changes in medication dose or frequency. denies adverse side effects related to current therapy except hard to split pill in half for combined dose  3) DM2 - on insulin and oral meds -  reports compliance with ongoing medical treatment; interval changes in medication reviewed - now off actos, on generic amaryl. denies adverse side effects related to current therapy. no hypoglycemia symptoms or events - checks cbg 2x/d before meals - 90-130 - would like to stop insulin and take only pills  4) dyslipidemia - reports compliance with ongoing medical treatment and no changes in medication dose  or frequency. denies adverse side effects related to current therapy.   5) hx H. pylori gastrirs - also IBS dx - denies stomach pains at this time  Clinical Review Panels:  Lipid Management   Cholesterol:  170 (08/29/2009)   LDL (bad choesterol):  83 (08/29/2009)   HDL (good cholesterol):  50.40 (08/29/2009)  Diabetes Management   HgBA1C:  7.2 (08/29/2009)   Creatinine:  0.8 (08/29/2009)   Last Flu Vaccine:  Historical (03/11/2009)   Last Pneumovax:  Historical (03/11/2009)  CBC   WBC:  5.9 (04/06/2009)   RBC:  4.62 (04/06/2009)   Hgb:  14.2 (04/06/2009)   Hct:  41.8 (04/06/2009)   Platelets:  201 (04/06/2009)   MCV  90.5 (04/06/2009)   MCHC  34.0 (04/06/2009)   RDW  12.8 (04/06/2009)   PMN:  43 (04/06/2009)   Lymphs:  48 (04/06/2009)   Monos:  6 (04/06/2009)   Eosinophils:  3 (04/06/2009)   Basophil:  0 (04/06/2009)  Complete Metabolic Panel   Glucose:  98 (02/22/2009)   Sodium:  140 (02/22/2009)   Potassium:  4.6 (02/22/2009)   Chloride:  103 (02/22/2009)   CO2:  24 (02/22/2009)   BUN:  13 (02/22/2009)   Creatinine:  0.8 (08/29/2009)   Albumin:  4.3 (02/22/2009)   Total Protein:  7.4 (02/22/2009)   Calcium:  9.7 (02/22/2009)   Total Bili:  0.6 (02/22/2009)   Alk Phos:  79 (02/22/2009)   SGPT (ALT):  16 (02/22/2009)   SGOT (AST):  25 (02/22/2009)   Current Medications (verified): 1)  Levoxyl 200 Mcg Tabs (Levothyroxine Sodium) .... Take 1 By Mouth Once Daily 2)  Levoxyl 25 Mcg Tabs (Levothyroxine Sodium) .... Take One Half Tablet in Addition The The 200 Micrograms Tablet 3)  Humulin N 100 Unit/ml Susp (Insulin Isophane Human) .Marland Kitchen.. 10 Units in The Am and 10 Units in The  Pm 4)  Nitrolingual 0.4 Mg/spray Soln (Nitroglycerin) .... Inhale 1 Spray As Directed Every Two Hours 5)  Metoprolol Succinate 50 Mg Xr24h-Tab (Metoprolol Succinate) .... Take 1/2 By Mouth Qd 6)  Crestor 10 Mg Tabs (Rosuvastatin Calcium) .... One By Mouth Qhs 7)  Diltiazem Hcl Er Beads 240 Mg  Xr24h-Cap (Diltiazem Hcl Er Beads) .... Take One Capsule By Mouth Daily 8)  Aspirin 81 Mg Tbec (Aspirin) .... Take One Tablet By Mouth Daily 9)  Vitamin E 1000 Unit Caps (Vitamin E) .... Take 1 Tablet By Mouth Once A Day 10)  Furosemide 40 Mg Tabs (Furosemide) .Marland Kitchen.. 1 By Mouth  Every Morning and 1 By Mouth At Lunch 11)  Gabapentin 300 Mg Caps (Gabapentin) .... Take 1 By Mouth Once Daily As Needed 12)  Meclizine Hcl 25 Mg Tabs (Meclizine Hcl) .... Take 1 By Mouth Once Daily As Needed For Dizziness 13)  Alprazolam 0.5 Mg Tabs (Alprazolam) .... Take 1 Two Times A Day 14)  Vitamin D3 1000 Unit Tabs (Cholecalciferol) .... Take 1 Cap Daily 15)  Proventil Hfa 108 (90 Base) Mcg/act Aers (Albuterol Sulfate) .... Use As Needed 16)  Lotensin 20 Mg Tabs (Benazepril Hcl) .... Take 1 Tablet By Mouth Once A Day 17)  Amaryl 2 Mg Tabs (Glimepiride) .... Take 1 Tablet By Mouth Once A Day 18)  Aspirin 81 Mg Tbec (Aspirin) .... Take One Tablet By Mouth Daily 19)  Oxybutynin Chloride 5 Mg Xr24h-Tab (Oxybutynin Chloride) .... Take 1 By Mouth Once Daily 20)  Omeprazole 20 Mg Cpdr (Omeprazole) .... Take 1 By Mouth Qd  Allergies (verified): 1)  ! Penicillin  Past History:  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 History of congestive heart failure with preserved LV systolic function Hypertension Hyperlipidemia Diabetes, type II-insulin-dependent IRRITABLE BOWEL SYNDROME (ICD-564.1)-mixed      Colonoscopy 2008 non-specific colitis, IH, [place] Diverticulosis, RECTAL ULCER 2o to ASA      Dysphagia-BaSw 2009-nonspecific esophageal motility disorder DUODENAL DIVERTICULUM GERD w/ HH, esophigitis   COPD OSTEOARTHRITIS -  bilateral TKA HYPOTHYROIDISM  IMPINGEMENT SYNDROME, left shoulder   Physicians Cardiology: General-Dr. Dietrich Pates; EP-Dr. Ladona Ridgel GI -   Review of Systems  The patient denies anorexia, vision loss, chest pain, and abdominal pain.    Physical Exam  General:  overweight-appearing.  alert, well-developed, well-nourished, and cooperative to examination.    Lungs:  normal respiratory effort, no intercostal retractions or use of accessory muscles; normal breath sounds bilaterally - no crackles and no wheezes.    Heart:  normal rate, regular rhythm, no murmur, and no rub. BLE with 2+ chronic edema. normal DP pulses and normal cap refill in all 4 extremities    Abdomen:  obese, soft, non-tender, normal bowel sounds, no distention; no  masses and no appreciable hepatomegaly or splenomegaly.   Msk:  back: full range of motion of lumbar spine. Nontender to palpation. Negative straight leg raise. Deep tendon reflexes symmetrically intact at Achilles and patella, negative clonus. Sensation intact throughout all dermatomes in bilateral lower extremities. Full strength to manual muscle testing in all major muscule groups including EHL, anterior tibialis, gastrocnemius, quadriceps, and iliopsoas. Able to heel and toe walk without difficulty and ambulates with a normal gait.  Skin:  no rashes, vesicles, ulcers, or erythema. No nodules or irregularity to palpation.  no brui Psych:  Oriented X3, memory intact for recent and remote, normally interactive, good eye contact, not anxious appearing, mild depressed appearing, and not agitated.      Impression & Recommendations:  Problem # 1:  FATIGUE (ICD-780.79)  nonspecific exam and hx -  recheck addl labs, others done 3/21 reviewed - also check xray to look for DDD given stiffness on left side and UA to look for UTI/infx  Orders: TLB-CBC Platelet - w/Differential (85025-CBCD) TLB-Hepatic/Liver Function Pnl (80076-HEPATIC) TLB-BMP (Basic Metabolic Panel-BMET)  (80048-METABOL) TLB-B12 + Folate Pnl (16109_60454-U98/JXB) TLB-Udip w/ Micro (81001-URINE) T-Culture, Urine ([id number])  Problem # 2:  LUMBAGO (ICD-724.2) ?DDD - no fall hx but also r/o comp fx check films now -  will avoid narcotics as these likely to exac fatigue but pain can be exac problem The following medications were removed from the medication list:    Lortab 5-500 Mg Tabs (Hydrocodone-acetaminophen) .Marland Kitchen... Take as needed Her updated medication list for this problem includes:    Aspirin 81 Mg Tbec (Aspirin) .Marland Kitchen... Take one tablet by mouth daily  Orders: T-Lumbar Spine 2 Views (72100TC) T-Thoracic Spine 2 Views (30865HQ) TLB-Udip w/ Micro (81001-URINE) T-Culture, Urine ([id number])  Problem # 3:  DIABETES MELLITUS, TYPE II, ON INSULIN (ICD-250.00)  Her updated medication list for this problem includes:    Humulin N 100 Unit/ml Susp (Insulin isophane human) .Marland KitchenMarland KitchenMarland KitchenMarland Kitchen 10 units in the am and 10 units in the  pm    Aspirin 81 Mg Tbec (Aspirin) .Marland Kitchen... Take one tablet by mouth daily    Lotensin 20 Mg Tabs (Benazepril hcl) .Marland Kitchen... Take 1 tablet by mouth once a day    Amaryl 2 Mg Tabs (Glimepiride) .Marland Kitchen... Take 1 tablet by mouth once a day  Labs Reviewed: Creat: 0.8 (08/29/2009)    Reviewed HgBA1c results: 7.2 (08/29/2009)  Problem # 4:  HYPERTENSION (ICD-401.9)  Her updated medication list for this problem includes:    Metoprolol Succinate 50 Mg Xr24h-tab (Metoprolol succinate) .Marland Kitchen... Take 1/2 by mouth qd    Diltiazem Hcl Er Beads 240 Mg Xr24h-cap (Diltiazem hcl er beads) .Marland Kitchen... Take one capsule by mouth daily    Furosemide 40 Mg Tabs (Furosemide) .Marland Kitchen... 1 by mouth  every morning and 1 by mouth at lunch    Lotensin 20 Mg Tabs (Benazepril hcl) .Marland Kitchen... Take 1 tablet by mouth once a day  BP today: 130/80 Prior BP: 166/82 (09/15/2009)  Labs Reviewed: K+: 4.6 (02/22/2009) Creat: : 0.8 (08/29/2009)   Chol: 170 (08/29/2009)   HDL: 50.40 (08/29/2009)   LDL: 83 (08/29/2009)   TG: 181.0  (08/29/2009)  Problem # 5:  HYPERLIPIDEMIA (ICD-272.4)  Her updated medication list for this problem includes:    Crestor 10 Mg Tabs (Rosuvastatin calcium) ..... One by mouth qhs  Labs Reviewed: SGOT: 25 (02/22/2009)   SGPT: 16 (02/22/2009)   HDL:50.40 (08/29/2009), 46 (07/21/2008)  LDL:83 (08/29/2009), 75 (07/21/2008)  Chol:170 (08/29/2009), 167 (07/21/2008)  Trig:181.0 (08/29/2009), 232 (07/21/2008)  Complete Medication List: 1)  Levoxyl 200 Mcg Tabs (Levothyroxine sodium) .... Take 1 by mouth once daily 2)  Levoxyl 25 Mcg Tabs (Levothyroxine sodium) .... Take one half tablet in addition the the 200 micrograms tablet 3)  Humulin N 100 Unit/ml Susp (Insulin isophane human) .Marland Kitchen.. 10 units in the am and 10 units in the  pm 4)  Nitrolingual 0.4 Mg/spray Soln (Nitroglycerin) .... Inhale 1 spray as directed every two hours 5)  Metoprolol Succinate 50 Mg Xr24h-tab (Metoprolol succinate) .... Take 1/2 by mouth qd 6)  Crestor 10 Mg Tabs (Rosuvastatin calcium) .... One by mouth qhs 7)  Diltiazem Hcl Er Beads 240 Mg Xr24h-cap (Diltiazem hcl er beads) .... Take one capsule by mouth daily 8)  Aspirin 81 Mg Tbec (Aspirin) .... Take one tablet by mouth daily 9)  Vitamin E 1000 Unit Caps (Vitamin e) .... Take 1 tablet by mouth once a day 10)  Furosemide 40 Mg Tabs (Furosemide) .Marland Kitchen.. 1 by mouth  every morning and 1 by mouth at lunch 11)  Gabapentin 300 Mg Caps (Gabapentin) .... Take 1 by mouth once daily as needed 12)  Meclizine Hcl 25 Mg Tabs (Meclizine hcl) .... Take 1 by mouth once daily as needed for dizziness 13)  Alprazolam 0.5 Mg Tabs (Alprazolam) .... Take 1 two times a day 14)  Vitamin D3 1000 Unit Tabs (Cholecalciferol) .... Take 1 cap daily 15)  Proventil Hfa 108 (90 Base) Mcg/act Aers (Albuterol sulfate) .... Use as needed 16)  Lotensin 20 Mg Tabs (Benazepril hcl) .... Take 1 tablet by mouth once a day 17)  Amaryl 2 Mg Tabs (Glimepiride) .... Take 1 tablet by mouth once a day 18)  Aspirin 81  Mg Tbec (Aspirin) .... Take one tablet by mouth daily 19)  Oxybutynin Chloride 5 Mg Xr24h-tab (Oxybutynin chloride) .... Take 1 by mouth once daily 20)  Omeprazole 20 Mg Cpdr (Omeprazole) .... Take 1 by mouth qd  Patient Instructions: 1)  it was good to see you today. 2)  test(s) ordered today - your results will be called to you in 48-72 hours from the time of test completion 3)  no medication changes - consider taking a "B complex" vitamin once daily  4)  Please keep follow-up appointment as previously scheduled, sooner if problems.

## 2010-07-13 NOTE — Assessment & Plan Note (Signed)
Summary: ABD PAIN/ PER TRIAGE /NWS   Vital Signs:  Patient profile:   77 year old female Height:      70 inches (177.80 cm) Weight:      266.2 pounds (121.00 kg) O2 Sat:      98 % on Room air Temp:     97.8 degrees F (36.56 degrees C) oral Pulse rate:   87 / minute BP sitting:   122 / 72  (left arm) Cuff size:   large  Vitals Entered By: Orlan Leavens RMA (May 08, 2010 11:03 AM)  O2 Flow:  Room air CC: abdominal pain Is Patient Diabetic? Yes Did you bring your meter with you today? No Pain Assessment Patient in pain? yes     Location: abdomen Type: aching Comments Pt also want urine check ? UTI   Primary Care Provider:  Newt Lukes MD  CC:  abdominal pain.  History of Present Illness: here for continued abd pain - located left groin has seen GI and gyn for same - no abn identified prior tx for UTI with min relief of same prior back and ortho eval during hosp summer 2011 but unable to f/u with local ortho due to $$ concerns pain remains in BLQ L>R and suprapubic region pain worst when sitting or walking -  pain radiates into left leg also a/w knee pain L>R but not swelling  also reviewed chronic med issues: 1) HTN - reports compliance with ongoing medical treatment; recent changes in medication reviewed - prev off amlodipine in lotrel due to edema, not taking dilt b/c the opened cap casued her face to feel numb when she smelled it and ?if that med then causes her feet to be numb! pt brought pill bottles and all reviewed today - inc metoprolol since last OV  2) hypothyroid - reports compliance with ongoing medical treatment ;no changes in medication dose or frequency. denies adverse side effects related to current therapy except hard to split pill in half for combined dose = 212.5 mcg  3) DM2 - on insulin and oral meds -  reports compliance with ongoing medical treatment; interval changes in medication reviewed - off actos due to fear SE, on generic amaryl and  insulin. denies adverse side effects related to current therapy. no hypoglycemia symptoms or events - checks cbg 2x/d before meals - 90-130 -  4) dyslipidemia - reports remaining off medical treatment since summer 2011 and feels much improved - feels general weakness has resolved and no further diffuse muscle aches  Clinical Review Panels:  Diabetes Management   HgBA1C:  7.4 (04/19/2010)   Creatinine:  0.9 (03/17/2010)   Last Flu Vaccine:  Fluvax 3+ (02/28/2010)   Last Pneumovax:  Historical (03/11/2009)  CBC   WBC:  7.0 (12/30/2009)   RBC:  4.35 (12/30/2009)   Hgb:  13.7 (12/30/2009)   Hct:  39.7 (12/30/2009)   Platelets:  237.0 (12/30/2009)   MCV  91.3 (12/30/2009)   MCHC  34.5 (12/30/2009)   RDW  13.2 (12/30/2009)   PMN:  62.6 (12/30/2009)   Lymphs:  30.0 (12/30/2009)   Monos:  5.9 (12/30/2009)   Eosinophils:  1.1 (12/30/2009)   Basophil:  0.4 (12/30/2009)  Complete Metabolic Panel   Glucose:  111 (12/25/2009)   Sodium:  135 (12/25/2009)   Potassium:  4.1 (12/25/2009)   Chloride:  103 (12/25/2009)   CO2:  25 (12/25/2009)   BUN:  9 (03/17/2010)   Creatinine:  0.9 (03/17/2010)   Albumin:  4.0 (10/11/2009)  Total Protein:  7.1 (10/11/2009)   Calcium:  10.0 (12/25/2009)   Total Bili:  0.5 (10/11/2009)   Alk Phos:  87 (10/11/2009)   SGPT (ALT):  27 (10/11/2009)   SGOT (AST):  36 (10/11/2009)   Current Medications (verified): 1)  Levoxyl 200 Mcg Tabs (Levothyroxine Sodium) .... Take 1 By Mouth Once Daily 2)  Levoxyl 25 Mcg Tabs (Levothyroxine Sodium) .... One Tablet Every Other Day (In Addition The The 200 Micrograms Tablet) 3)  Humulin N 100 Unit/ml Susp (Insulin Isophane Human) .Marland Kitchen.. 12 Units in The Am and 12 Units in The  Pm 4)  Nitrolingual 0.4 Mg/spray Soln (Nitroglycerin) .... Inhale 1 Spray As Directed Every Two Hours 5)  Metoprolol Succinate 50 Mg Xr24h-Tab (Metoprolol Succinate) .... Take 1 By Mouth Once Daily 6)  Aspirin 81 Mg Tbec (Aspirin) .... Take One  Tablet By Mouth Daily 7)  Gabapentin 300 Mg Caps (Gabapentin) .... Take 1 By Mouth At Bedtime 8)  Alprazolam 0.5 Mg Tabs (Alprazolam) .... Take 1/2-1 Tab By Mouth Two Times A Day As Needed 9)  Vitamin D3 1000 Unit Tabs (Cholecalciferol) .... Take 1 Cap Daily 10)  Proventil Hfa 108 (90 Base) Mcg/act Aers (Albuterol Sulfate) .... Use As Needed 11)  Omeprazole 20 Mg Cpdr (Omeprazole) .... Take 1 By Mouth Qd 12)  Cyanocobalamin 1000 Mcg/ml Soln (Cyanocobalamin) .... Take 1 Injection Im Q Month 13)  Bd Luer-Lok Syringe 23g X 1-1/4" 3 Ml Misc (Syringe/needle (Disp)) .... Use 1 Q Month To Dispense B12 14)  Glimepiride 2 Mg Tabs (Glimepiride) .Marland Kitchen.. 1 By Mouth Every Am 15)  Benazepril Hcl 20 Mg Tabs (Benazepril Hcl) .... Take 1 Tablet By Mouth Once A Day 16)  Hydrocodone-Acetaminophen 5-500 Mg Tabs (Hydrocodone-Acetaminophen) .... Take 1/2 -1 Tab Every 8 Hours As Needed For Pain 17)  Vesicare 5 Mg Tabs (Solifenacin Succinate) .Marland Kitchen.. 1 By Mouth Once Daily 18)  Furosemide 40 Mg Tabs (Furosemide) .Marland Kitchen.. 1 By Mouth Daily As Needed For Swelling 19)  Klor-Con M20 20 Meq Cr-Tabs (Potassium Chloride Crys Cr) .Marland Kitchen.. 1 By Mouth Once Daily As Needed With Furosemide For Swelling 20)  Nystatin 100000 Unit/gm Powd (Nystatin) .... Apply To Affected Skin Three Times A Day As Needed For Yeast 21)  Vitamin E 400 Unit Caps (Vitamin E) .... Take 1 Tab Daily 22)  Amlodipine Besylate 10 Mg Tabs (Amlodipine Besylate) .... Take 1 Tablet By Mouth Once Daily  Allergies (verified): 1)  ! Penicillin  Past History:  Past Medical History: ASCVD: Cath in 9/99-70% mid LAD with diffuse distal disease, 70% T1, 60% mid circumflex,       50% mid RCA, normal ejection fraction; negative stress nuclear in 07/2008  Cerebrovascular disease-carotid bruits; no focal disease in 1999, 2002 and 2006 Conduction system disease-dual-chamber pacemaker implant ([employer]tr[employer])-2003 Hx CHF with preserved LV systolic function  Hypertension    Hyperlipidemia Diabetes, type II-insulin-dependent IRRITABLE BOWEL SYNDROME (ICD-564.1)-mixed      Colonoscopy 2008 non-specific colitis, IH, [place] Diverticulosis, RECTAL ULCER 2o to ASA      Dysphagia-BaSw 2009-nonspecific esophageal motility disorder DUODENAL DIVERTICULUM   GERD w/ HH, esophigitis   COPD OSTEOARTHRITIS - bilateral TKA HYPOTHYROIDISM  IMPINGEMENT SYNDROME, left shoulder  Physician roster: Cardiology: [facility]; [facility]  Past Surgical History: Dual-chamber pacemaker implant in 2003 (Medtronic) Bilateral TKA NASAL SURGERY CHOLECYSTECTOMY HEMORRHOIDECTOMY  HYSTERECTOMY CARPAL TUNNEL RELEASE Thyroidectomy in 1980 for goiter Bladder Repair  Review of Systems  The patient denies fever, weight loss, headaches, melena, and  hematochezia.    Physical Exam  General:  overweight-appearing.  alert, well-developed, well-nourished, and cooperative to examination.  Lungs:  normal respiratory effort, no intercostal retractions or use of accessory muscles; normal breath sounds bilaterally - no crackles and no wheezes.    Heart:  normal rate, regular rhythm, no murmur, and no rub. BLE with trace chronic edema. Abdomen:  soft, mild tender in LLQ but no rebound, normal bowel sounds, no distention; no masses and no appreciable hepatomegaly or splenomegaly.   Msk:  back: full range of motion of lumbar spine. Nontender to palpation. Negative straight leg raise. Deep tendon reflexes symmetrically intact. Sensation intact throughout all dermatomes in bilateral lower extremities. Full strength to manual muscle testing in all major muscle groups - ambulates with a normal gait.    Impression & Recommendations:  Problem # 1:  ABDOMINAL TENDERNESS, LEFT LOWER QUADRANT (ICD-789.64) s/p GI and gyn eval 03/2010 for same - per GI note reviewed today: Possible etiologies include ovarian pain or abdominal wall pain.  Diverticulitis or pain from other colonic  abnormalities are less likely.  Although she complains of very minimal dysuria with the onset of urination,  stool cultures in the past have been negative. CT a/p 03/2010 reviewed - no acute abn or other explaination for pain symptoms  have referred to ortho for eval but unable to $$ schedule same - pt indep working on eval by [facility] as seen there before L groin pain unchanged with left leg pain and i still feel multifact -spinal stenosis, DJD at L hip and knees - best addressed by ortho specialist cont hydrocodone pending this eval  Problem # 2:  DIABETES MELLITUS, TYPE II, ON INSULIN (ICD-250.00)  Her updated medication list for this problem includes:    Humulin N 100 Unit/ml Susp (Insulin isophane human) .Marland KitchenMarland KitchenMarland KitchenMarland Kitchen 15 units in the am and 15 units in the  pm    Aspirin 81 Mg Tbec (Aspirin) .Marland Kitchen... Take one tablet by mouth daily    Glimepiride 2 Mg Tabs (Glimepiride) .Marland Kitchen... 1 by mouth every am    Benazepril Hcl 20 Mg Tabs (Benazepril hcl) .Marland Kitchen... Take 1 tablet by mouth once a day  cont inc insulin dose since off actos 02/2010 - cont OHA  home cbg reviewed  Labs Reviewed: Creat: 0.9 (03/17/2010)    Reviewed HgBA1c results: 7.4 (04/19/2010)  7.4 (12/11/2009)  Problem # 3:  HIP PAIN, LEFT (ICD-719.45) see discussion above under abd pain to see ortho at [facility] Her updated medication list for this problem includes:    Aspirin 81 Mg Tbec (Aspirin) .Marland Kitchen... Take one tablet by mouth daily    Hydrocodone-acetaminophen 5-500 Mg Tabs (Hydrocodone-acetaminophen) .Marland Kitchen... Take 1/2 -1 tab every 8 hours as needed for pain  Complete Medication List: 1)  Levoxyl 200 Mcg Tabs (Levothyroxine sodium) .... Take 1 by mouth once daily 2)  Levoxyl 25 Mcg Tabs (Levothyroxine sodium) .... One tablet every other day (in addition the the 200 micrograms tablet) 3)  Humulin N 100 Unit/ml Susp (Insulin isophane human) .Marland Kitchen.. 15 units in the am and 15 units in the  pm 4)  Nitrolingual 0.4 Mg/spray Soln (Nitroglycerin) .... Inhale  1 spray as directed every two hours 5)  Metoprolol Succinate 50 Mg Xr24h-tab (Metoprolol succinate) .... Take 1 by mouth once daily 6)  Aspirin 81 Mg Tbec (Aspirin) .... Take one tablet by mouth daily 7)  Gabapentin 300 Mg Caps (Gabapentin) .... Take 1 by mouth at bedtime 8)  Alprazolam 0.5 Mg Tabs (Alprazolam) .Marland KitchenMarland KitchenMarland Kitchen  Take 1/2-1 tab by mouth two times a day as needed 9)  Vitamin D3 1000 Unit Tabs (Cholecalciferol) .... Take 1 cap daily 10)  Proventil Hfa 108 (90 Base) Mcg/act Aers (Albuterol sulfate) .... Use as needed 11)  Omeprazole 20 Mg Cpdr (Omeprazole) .... Take 1 by mouth qd 12)  Cyanocobalamin 1000 Mcg/ml Soln (Cyanocobalamin) .... Take 1 injection im q month 13)  Bd Luer-lok Syringe 23g X 1-1/4" 3 Ml Misc (Syringe/needle (disp)) .... Use 1 q month to dispense b12 14)  Glimepiride 2 Mg Tabs (Glimepiride) .Marland Kitchen.. 1 by mouth every am 15)  Benazepril Hcl 20 Mg Tabs (Benazepril hcl) .... Take 1 tablet by mouth once a day 16)  Hydrocodone-acetaminophen 5-500 Mg Tabs (Hydrocodone-acetaminophen) .... Take 1/2 -1 tab every 8 hours as needed for pain 17)  Vesicare 5 Mg Tabs (Solifenacin succinate) .Marland Kitchen.. 1 by mouth once daily 18)  Furosemide 40 Mg Tabs (Furosemide) .Marland Kitchen.. 1 by mouth daily as needed for swelling 19)  Klor-con M20 20 Meq Cr-tabs (Potassium chloride crys cr) .Marland Kitchen.. 1 by mouth once daily as needed with furosemide for swelling 20)  Nystatin 100000 Unit/gm Powd (Nystatin) .... Apply to affected skin three times a day as needed for yeast 21)  Vitamin E 400 Unit Caps (Vitamin e) .... Take 1 tab daily 22)  Amlodipine Besylate 10 Mg Tabs (Amlodipine besylate) .... Take 1 tablet by mouth once daily  Other Orders: UA Dipstick w/o Micro (manual) (25956)  Patient Instructions: 1)  it was good to see you today. 2)  increase insulin to 15units two times a day  3)  other medications reviewed -  4)  keep appointment at [facility] orthopedist for hip and knee pain. Our office will contact you regarding  this appointment once made.  5)  Please keep scheduled follow-up appointment in 3 months for diabetes check (Feb), sooner if problems.  Prescriptions: HUMULIN N 100 UNIT/ML SUSP (INSULIN ISOPHANE HUMAN) 15 units in the AM and 15 units in the  PM  #1 mo x 6   Entered and Authorized by:   Newt Lukes MD   Signed by:   Newt Lukes MD on 05/08/2010   Method used:   Printed then faxed to ...       [facility]       [address]       [place], [place]  [address]       Ph: [phone] or [phone]       Fax: [phone]   RxID:   [id number]    Orders Added: 1)  UA Dipstick w/o Micro (manual) [81002] 2)  Est. Patient Level IV [31517]    Laboratory Results   Urine Tests    Routine Urinalysis   Color: yellow Appearance: Clear Glucose: negative   (Normal Range: Negative) Bilirubin: small   (Normal Range: Negative) Ketone: negative   (Normal Range: Negative) Spec. Gravity: <1.005   (Normal Range: 1.003-1.035) Blood: negative   (Normal Range: Negative) pH: 5.0   (Normal Range: 5.0-8.0) Protein: negative   (Normal Range: Negative) Urobilinogen: 0.2   (Normal Range: 0-1) Nitrite: negative   (Normal Range: Negative) Leukocyte Esterace: trace   (Normal Range: Negative)       Appended Document: ABD PAIN/ PER TRIAGE /NWS    Clinical Lists Changes  Medications: Rx of HUMULIN N 100 UNIT/ML SUSP (INSULIN ISOPHANE HUMAN) 15 units in the AM and 15 units in the  PM;  #1 mo x 6;  Signed;  Entered by: Orlan Leavens RMA;  Authorized by: Newt Lukes MD;  Method used: Faxed to [facility], [phone], [place], [place], [place]  [address], Ph: [phone] or [phone], Fax: [phone]    Prescriptions: HUMULIN N 100 UNIT/ML SUSP (INSULIN ISOPHANE HUMAN) 15 units in the AM and 15 units in the  PM  #1 mo x 6   Entered by:   Orlan Leavens RMA   Authorized by:   Newt Lukes MD   Signed  by:   Orlan Leavens RMA on 05/08/2010   Method used:   Faxed to ...       [facility] (retail)       [address]       [place], [place]  [address]       Ph: [phone] or [phone]       Fax: [phone]   RxID:   [id number]

## 2010-07-13 NOTE — Assessment & Plan Note (Signed)
Summary: 1 mos f/u #/cd   Vital Signs:  Patient profile:   77 year old female Height:      70 inches (177.80 cm) Weight:      270.0 pounds (122.73 kg) O2 Sat:      97 % on Room air Temp:     97.6 degrees F (36.44 degrees C) oral Pulse rate:   72 / minute BP sitting:   130 / 72  (left arm) Cuff size:   regular  Vitals Entered By: Orlan Leavens (November 24, 2009 10:05 AM)  O2 Flow:  Room air CC: 1 month follow-up Is Patient Diabetic? Yes Did you bring your meter with you today? Yes Pain Assessment Patient in pain? no        Primary Care Provider:  Newt Lukes MD  CC:  1 month follow-up.  History of Present Illness: c/o continued feeling fatigued and weak onset 3-4 months ago, progressive course no significant change off crestor and meclizine - still hard to stay awake during the day - no trouble sleeping at night no fever or hematuria, no CP or SOB or cough or swelling - no falls, no muscle weakness - no radiating pain or numbness - denies depression or sleep problems  labs reviewed - + b12 defic identified 10/2009 - 1st shot done 10/11/09- 2nd 6/3 (taking shots at home) still very concerned mildew under the house is cauing her to become ill  also reviewed other med issues: 1) HTN - reports compliance with ongoing medical treatment and no changes in medication dose or frequency. denies adverse side effects related to current therapy. pt brought pill bottles and all reviewed today  2) hypothyroid - reports compliance with ongoing medical treatment and no changes in medication dose or frequency. denies adverse side effects related to current therapy except hard to split pill in half for combined dose = 212.5 mcg  3) DM2 - on insulin and oral meds -  reports compliance with ongoing medical treatment; interval changes in medication reviewed - now off actos but wants to resume, on generic amaryl. denies adverse side effects related to current therapy. no hypoglycemia symptoms or  events - checks cbg 2x/d before meals - 90-130 - would like to stop insulin and take only pills  4) dyslipidemia - reports compliance with ongoing medical treatment and no changes in medication dose or frequency. denies adverse side effects related to current therapy but feels generally weak and c/o diffuse muscle aches  5) hx H. pylori gastrirs - also IBS dx - denies stomach pains at this time  Current Medications (verified): 1)  Levoxyl 200 Mcg Tabs (Levothyroxine Sodium) .... Take 1 By Mouth Once Daily 2)  Levoxyl 25 Mcg Tabs (Levothyroxine Sodium) .... Take One Half Tablet in Addition The The 200 Micrograms Tablet 3)  Humulin N 100 Unit/ml Susp (Insulin Isophane Human) .Marland Kitchen.. 15 Units in The Am and 15 Units in The  Pm 4)  Nitrolingual 0.4 Mg/spray Soln (Nitroglycerin) .... Inhale 1 Spray As Directed Every Two Hours 5)  Metoprolol Succinate 50 Mg Xr24h-Tab (Metoprolol Succinate) .... Take 1/2 By Mouth Qd 6)  Diltiazem Hcl Er Beads 240 Mg Xr24h-Cap (Diltiazem Hcl Er Beads) .... Take One Capsule By Mouth Daily 7)  Aspirin 81 Mg Tbec (Aspirin) .... Take One Tablet By Mouth Daily 8)  Vitamin E 1000 Unit Caps (Vitamin E) .... Take 1 Tablet By Mouth Once A Day 9)  Furosemide 40 Mg Tabs (Furosemide) .Marland Kitchen.. 1 By Mouth Daily 10)  Gabapentin 300 Mg Caps (Gabapentin) .... Take 1 By Mouth Once Daily As Needed 11)  Alprazolam 0.5 Mg Tabs (Alprazolam) .... Take 1 Two Times A Day As Needed - To Fill June 2 , 2011 12)  Vitamin D3 1000 Unit Tabs (Cholecalciferol) .... Take 1 Cap Daily 13)  Proventil Hfa 108 (90 Base) Mcg/act Aers (Albuterol Sulfate) .... Use As Needed 14)  Omeprazole 20 Mg Cpdr (Omeprazole) .... Take 1 By Mouth Qd 15)  Cyanocobalamin 1000 Mcg/ml Soln (Cyanocobalamin) .... Take 1 Injection Im Q Month 16)  Bd Luer-Lok Syringe 23g X 1-1/4" 3 Ml Misc (Syringe/needle (Disp)) .... Use 1 Q Month To Dispense B12 17)  Actos 30 Mg Tabs (Pioglitazone Hcl) .... Take 1 Tablet By Mouth Once A Day 18)   Benazepril Hcl 20 Mg Tabs (Benazepril Hcl) .... Take 1 Tab Daily 19)  Hydrocodone-Acetaminophen 5-500 Mg Tabs (Hydrocodone-Acetaminophen) .... Take 1 Q 6 Hours As Needed 20)  Detrol La 4 Mg Xr24h-Cap (Tolterodine Tartrate) .... Take 1 Cap Daily  Allergies (verified): 1)  ! Penicillin  Past History:  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 History of congestive heart failure with preserved LV systolic function Hypertension Hyperlipidemia   Diabetes, type II-insulin-dependent IRRITABLE BOWEL SYNDROME (ICD-564.1)-mixed      Colonoscopy 2008 non-specific colitis, IH, [place] Diverticulosis, RECTAL ULCER 2o to ASA      Dysphagia-BaSw 2009-nonspecific esophageal motility disorder DUODENAL DIVERTICULUM GERD w/ HH, esophigitis   COPD OSTEOARTHRITIS - bilateral TKA HYPOTHYROIDISM  IMPINGEMENT SYNDROME, left shoulder  Physician roster: Cardiology: [facility]; [facility] -   Review of Systems  The patient denies fever, weight loss, syncope, headaches, and abdominal pain.    Physical Exam  General:  overweight-appearing.  alert, well-developed, well-nourished, and cooperative to examination.    Lungs:  normal respiratory effort, no intercostal retractions or use of accessory muscles; normal breath sounds bilaterally - no crackles and no wheezes.    Heart:  normal rate, regular rhythm, no murmur, and no rub. BLE with 2+ chronic edema. normal DP pulses and normal cap refill in all 4 extremities    Neurologic:  alert & oriented X3 and cranial nerves II-XII symetrically intact.  strength normal in all extremities, sensation intact to light touch, and gait normal. speech fluent without dysarthria or  aphasia; follows commands with good comprehension.    Impression & Recommendations:  Problem # 1:  FATIGUE (ICD-780.79) multifactorial - cont replacing B12 - minimize sedating meds -  labs again reviewed and unrerkable except low B12 10/11/09 - consider FT3 and T4 next lab draw (not planned for today) head ct 10/2009 reviewed and unremarkable -  per cards note 11/22/09: This is a nonspecific symptom that has been chronic.  Patient asked about diet pills, which I recommended against.  I extolled the virtues of exercise and weight loss.    Metoprolol could be contributing to this problem, but at current dosage is minimal and not likely to be harmful.  Dr.  Felicity Coyer is pursuing appropriate avenues for now.  If unsuccessful, consideration can be given to a trial off of her low dose beta blocker.    Additional considerations include the patient's use of alprazolam, which she generally takes qRS rather than b.i.d., but in doses  up to 1 mg.  I discussed with her the need to taper this and to ultimately try to use one half tablet p.r.n.   She is being treated with a very high dose of levothyroxine.  Even though the a recent TSH was within the normal range, I would recommend checking a free T3 and T4 level.  Reacent basic lab tests were normal.  I will plan to see this nice woman again in one year.  Problem # 2:  HYPERLIPIDEMIA (ICD-272.4)  as per cards 6/14 : Patient is not taking Crestor as advised byt her primary care physician, who is evaluating whether this medication could be contributing to fatigue.  I doubt that this is the problem.  Once a statin is resumed, simvastatin could be substituted, which would likely result in a significant savings for the patient.   i agree - fatigue not improved off statin since last visit therefore, resume statin - simva ok  Labs Reviewed: SGOT: 36 (10/11/2009)   SGPT: 27 (10/11/2009)   HDL:50.40 (08/29/2009), 41 (12/08/2008)  LDL:83 (08/29/2009), 80  (16/03/9603)  Chol:170 (08/29/2009), 155 (12/08/2008)  Trig:181.0 (08/29/2009), 169 (12/08/2008)  Problem # 3:  HYPOTHYROIDISM (ICD-244.9)  Her updated medication list for this problem includes:    Levoxyl 200 Mcg Tabs (Levothyroxine sodium) .Marland Kitchen... Take 1 by mouth once daily    Levoxyl 25 Mcg Tabs (Levothyroxine sodium) .Marland Kitchen... Take one half tablet in addition the the 200 micrograms tablet  pt would like to take 2-112 tabs if possible for ease of med splitting -  will check TSH and FT3, T4 before advising on this (labs next OV)  Labs Reviewed: TSH: 1.88 (08/29/2009)    HgBA1c: 7.2 (08/29/2009) Chol: 170 (08/29/2009)   HDL: 50.40 (08/29/2009)   LDL: 83 (08/29/2009)   TG: 181.0 (08/29/2009)  Problem # 4:  DIABETES MELLITUS, TYPE II, ON INSULIN (ICD-250.00)  The following medications were removed from the medication list:    Lotensin 20 Mg Tabs (Benazepril hcl) .Marland Kitchen... Take 1 tablet by mouth once a day Her updated medication list for this problem includes:    Humulin N 100 Unit/ml Susp (Insulin isophane human) .Marland KitchenMarland KitchenMarland KitchenMarland Kitchen 15 units in the am and 15 units in the  pm    Aspirin 81 Mg Tbec (Aspirin) .Marland Kitchen... Take one tablet by mouth daily    Actos 30 Mg Tabs (Pioglitazone hcl) .Marland Kitchen... Take 1 tablet by mouth once a day    Benazepril Hcl 20 Mg Tabs (Benazepril hcl) .Marland Kitchen... Take 1 tab daily  per cards 11/22/09 - Patient brought in logbook of blood sugars for my review.  These have generally been fairly good with values around 150, but sometimes increased to near 200. Hemoglobin A1c was recently 7.2.  Slightly more intensive therapy could be considered.   Actos is not the ideal drug for this woman, but a trial off of it has already been performed.  There was no benefit in terms of pedal edema, but control of diabetes worsened. The patient asked that it be resumed.  I am still concerned that agents in this class may all have adverse cardiovascular effects, but there is no prohibition against using this drug in a  diabetic with coronary disease.  Labs Reviewed: Creat: 0.8 (10/11/2009)    Reviewed HgBA1c results: 7.2 (08/29/2009)  7.5 (12/08/2008)  Problem # 5:  VITAMIN B12 DEFICIENCY (ICD-266.2) 10/11/09 -B12 = 188 now on monthly replacement shot at home - cont same  Complete Medication List: 1)  Levoxyl 200 Mcg Tabs (Levothyroxine  sodium) .... Take 1 by mouth once daily 2)  Levoxyl 25 Mcg Tabs (Levothyroxine sodium) .... Take one half tablet in addition the the 200 micrograms tablet 3)  Humulin N 100 Unit/ml Susp (Insulin isophane human) .Marland Kitchen.. 15 units in the am and 15 units in the  pm 4)  Nitrolingual 0.4 Mg/spray Soln (Nitroglycerin) .... Inhale 1 spray as directed every two hours 5)  Metoprolol Succinate 50 Mg Xr24h-tab (Metoprolol succinate) .... Take 1/2 by mouth qd 6)  Diltiazem Hcl Er Beads 240 Mg Xr24h-cap (Diltiazem hcl er beads) .... Take one capsule by mouth daily 7)  Aspirin 81 Mg Tbec (Aspirin) .... Take one tablet by mouth daily 8)  Vitamin E 1000 Unit Caps (Vitamin e) .... Take 1 tablet by mouth once a day 9)  Furosemide 40 Mg Tabs (Furosemide) .Marland Kitchen.. 1 by mouth daily 10)  Gabapentin 300 Mg Caps (Gabapentin) .... Take 1 by mouth once daily as needed 11)  Alprazolam 0.5 Mg Tabs (Alprazolam) .... Take 1 two times a day as needed - to fill june 2 , 2011 12)  Vitamin D3 1000 Unit Tabs (Cholecalciferol) .... Take 1 cap daily 13)  Proventil Hfa 108 (90 Base) Mcg/act Aers (Albuterol sulfate) .... Use as needed 14)  Omeprazole 20 Mg Cpdr (Omeprazole) .... Take 1 by mouth qd 15)  Cyanocobalamin 1000 Mcg/ml Soln (Cyanocobalamin) .... Take 1 injection im q month 16)  Bd Luer-lok Syringe 23g X 1-1/4" 3 Ml Misc (Syringe/needle (disp)) .... Use 1 q month to dispense b12 17)  Actos 30 Mg Tabs (Pioglitazone hcl) .... Take 1 tablet by mouth once a day 18)  Benazepril Hcl 20 Mg Tabs (Benazepril hcl) .... Take 1 tab daily 19)  Hydrocodone-acetaminophen 5-500 Mg Tabs (Hydrocodone-acetaminophen) .... Take  1 q 6 hours as needed 20)  Detrol La 4 Mg Xr24h-cap (Tolterodine tartrate) .... Take 1 cap daily  Patient Instructions: 1)  it was good to see you today. 2)  bring all your medicines to every  appointment for review - 3)  Please keep follow-up appointment as scheduled 02/27/2010, but call sooner if problems. will check thyroid labs (including T4, FT3) and diabetes labs at that visit 4)  try 2 proteins every day for nutritional supplement as discussed 5)  keep taking B12 shots every month 6)  ok to resume cholesterol medicine

## 2010-07-13 NOTE — Progress Notes (Signed)
Summary: alprazolam  Phone Note Refill Request   Refills Requested: Medication #1:  ALPRAZOLAM 0.5 MG TABS take 1 two times a day # 60   Last Refilled: 10/11/2009 Joesphine Bare [phone] Last ov 10/27/09  Next Appointment Scheduled: 11/25/09 Initial call taken by: Orlan Leavens,  November 10, 2009 3:47 PM  Follow-up for Phone Call        Dr. Felicity Coyer is out of office. Dr. Jonny Ruiz is this ok to refill ? Follow-up by: Orlan Leavens,  November 10, 2009 3:47 PM    New/Updated Medications: ALPRAZOLAM 0.5 MG TABS (ALPRAZOLAM) take 1 two times a day as needed - to fill june 2 , 2011 Prescriptions: ALPRAZOLAM 0.5 MG TABS (ALPRAZOLAM) take 1 two times a day as needed - to fill june 2 , 2011  #60 x 0   Entered and Authorized by:   Corwin Levins MD   Signed by:   Corwin Levins MD on 11/10/2009   Method used:   Print then Give to Patient   RxID:   [id number]  done hardcopy to [facility] - dahlia  Corwin Levins MD  November 10, 2009 5:32 PM   Rx faxed to pharmacy Margaret Pyle, CMA  November 11, 2009 8:14 AM

## 2010-07-13 NOTE — Medication Information (Signed)
Summary: Diabetic supplies/[facility]  Diabetic supplies/[facility]   Imported By: Lester [place] 07/04/2010 12:29:20  _____________________________________________________________________  External Attachment:    Type:   Image     Comment:   External Document

## 2010-07-13 NOTE — Progress Notes (Signed)
Summary: cream  Phone Note From Pharmacy   Caller: [facility] Summary of Call: recieved fax stating patient is requesting antiniotic for rawness under breast & between legs. Anitbiotic v/s anitfungal cream ? Pls advise Initial call taken by: Orlan Leavens,  Oct 31, 2009 9:50 AM  Follow-up for Phone Call        antifungal - use nystatin-traimcin cream - apply to affected area three times a day x 7 days, then as needed - med listed historically on list - may call or fax in as needed - thanks Follow-up by: Newt Lukes MD,  Oct 31, 2009 10:15 AM  Additional Follow-up for Phone Call Additional follow up Details #1::        Updated EMR fax med to pharmacy Additional Follow-up by: Orlan Leavens,  Oct 31, 2009 10:30 AM    New/Updated Medications: NYSTATIN-TRIAMCINOLONE 100000-0.1 UNIT/GM-% CREA (NYSTATIN-TRIAMCINOLONE) apply to affected skin three times a day x 7 days, then as needed Prescriptions: NYSTATIN-TRIAMCINOLONE 100000-0.1 UNIT/GM-% CREA (NYSTATIN-TRIAMCINOLONE) apply to affected skin three times a day x 7 days, then as needed  #1 large x 1   Entered by:   Orlan Leavens   Authorized by:   Newt Lukes MD   Signed by:   Orlan Leavens on 10/31/2009   Method used:   Faxed to ...       [facility]       [address]       [place], [place]  [address]       Ph: [phone] or [phone]       Fax: [phone]   RxID:   [id number] NYSTATIN-TRIAMCINOLONE 100000-0.1 UNIT/GM-% CREA (NYSTATIN-TRIAMCINOLONE) apply to affected skin three times a day x 7 days, then as needed  #1 large x 1   Entered and Authorized by:   Newt Lukes MD   Signed by:   Newt Lukes MD on 10/31/2009   Method used:   Historical   RxID:   [id number]

## 2010-07-13 NOTE — Progress Notes (Signed)
Summary: CALL  Phone Note Call from Patient Call back at Home Phone [phone]   Summary of Call: Patient is requesting a call regarding her meds for blood sugar.  Initial call taken by: Lamar Sprinkles, CMA,  November 25, 2009 9:56 AM  Follow-up for Phone Call        Pt currently is on a "diet", watching intake of carbs and increase intake of raw veggies. Her cbg's have been lower than usual. She takes humulin 15u in am and pm. Previously was on 10u two times a day. She has had frequent "lower" numbers, around 85. Pt wants to know if she can resume the 10units two times a day if cbgs continue to be in the 80's?  Follow-up by: Lamar Sprinkles, CMA,  November 25, 2009 3:03 PM  Additional Follow-up for Phone Call Additional follow up Details #1::        yes, take humulin 10units two times a day  Additional Follow-up by: Newt Lukes MD,  November 25, 2009 3:39 PM    Additional Follow-up for Phone Call Additional follow up Details #2::    Notified pt with md instructions, also updated EMR Follow-up by: Orlan Leavens,  November 25, 2009 4:41 PM  New/Updated Medications: HUMULIN N 100 UNIT/ML SUSP (INSULIN ISOPHANE HUMAN) 10 units in the AM and 10 units in the  PM

## 2010-07-13 NOTE — Assessment & Plan Note (Signed)
Summary: left side abd pain...as.   History of Present Illness Visit Type: new patient  Primary GI MD: Melvia Heaps MD [facility] Primary Provider: Newt Lukes MD Requesting Provider: Newt Lukes MD Chief Complaint: left side abd pain, and loose stools  History of Present Illness:   [name] [name] is a 77 year old white female referred at the request of Dr. Felicity Coyer for evaluation of abdominal pain.  For the past several months she has been complaining of left lower quadrant pain.  It is poorly described but appears to be fairly constant.  It is unaffected by eating or bowel movements.  There has been no change in her bowel habits nor has there been any melena or hematochezia.  She has some pain with the onset urinating but denies dysuria or urinary frequency.  Urine cultures in May and July were negative.  She was recently treated with a course of antibiotics including Cipro and Flagyl but reports no change in her symptoms.  She apparently underwent colonoscopy in 2008 that demonstrated diverticulosis, a rectal ulcer felt secondary to aspirin and nonspecific colitis.  She is without fever or weight loss.   GI Review of Systems     Location of  Abdominal pain: left side.    Denies abdominal pain, acid reflux, belching, bloating, chest pain, dysphagia with liquids, dysphagia with solids, heartburn, loss of appetite, nausea, vomiting, vomiting blood, weight loss, and  weight gain.        Denies anal fissure, black tarry stools, change in bowel habit, constipation, diarrhea, diverticulosis, fecal incontinence, heme positive stool, hemorrhoids, irritable bowel syndrome, jaundice, light color stool, liver problems, rectal bleeding, and  rectal pain.    Current Medications (verified): 1)  Levoxyl 200 Mcg Tabs (Levothyroxine Sodium) .... Take 1 By Mouth Once Daily 2)  Levoxyl 25 Mcg Tabs (Levothyroxine Sodium) .... One Tablet Every Other Day (In Addition The The 200 Micrograms Tablet) 3)   Humulin N 100 Unit/ml Susp (Insulin Isophane Human) .Marland Kitchen.. 10 Units in The Am and 10 Units in The  Pm 4)  Nitrolingual 0.4 Mg/spray Soln (Nitroglycerin) .... Inhale 1 Spray As Directed Every Two Hours 5)  Metoprolol Succinate 50 Mg Xr24h-Tab (Metoprolol Succinate) .... Take 1 By Mouth Once Daily 6)  Aspirin 81 Mg Tbec (Aspirin) .... Take One Tablet By Mouth Daily 7)  Gabapentin 300 Mg Caps (Gabapentin) .... Take 1 By Mouth Two Times A Day 8)  Alprazolam 0.5 Mg Tabs (Alprazolam) .... Take 1/2-1 Tab By Mouth Two Times A Day As Needed 9)  Vitamin D3 1000 Unit Tabs (Cholecalciferol) .... Take 1 Cap Daily 10)  Proventil Hfa 108 (90 Base) Mcg/act Aers (Albuterol Sulfate) .... Use As Needed 11)  Omeprazole 20 Mg Cpdr (Omeprazole) .... Take 1 By Mouth Qd 12)  Cyanocobalamin 1000 Mcg/ml Soln (Cyanocobalamin) .... Take 1 Injection Im Q Month 13)  Bd Luer-Lok Syringe 23g X 1-1/4" 3 Ml Misc (Syringe/needle (Disp)) .... Use 1 Q Month To Dispense B12 14)  Glimepiride 2 Mg Tabs (Glimepiride) .Marland Kitchen.. 1 By Mouth Every Am 15)  Benazepril Hcl 20 Mg Tabs (Benazepril Hcl) .... Take 1 Tablet By Mouth Once A Day 16)  Hydrocodone-Acetaminophen 5-500 Mg Tabs (Hydrocodone-Acetaminophen) .... Take 1/2 -1 Tab Every 8 Hours As Needed For Pain 17)  Detrol La 4 Mg Xr24h-Cap (Tolterodine Tartrate) .... Take 1 Cap Daily 18)  Furosemide 40 Mg Tabs (Furosemide) .Marland Kitchen.. 1 By Mouth Daily As Needed For Swelling 19)  Klor-Con M20 20 Meq Cr-Tabs (Potassium Chloride Crys Cr) .Marland KitchenMarland KitchenMarland Kitchen  1 By Mouth Once Daily As Needed With Furosemide For Swelling 20)  Nystatin 100000 Unit/gm Powd (Nystatin) .... Apply To Affected Skin Three Times A Day As Needed For Yeast 21)  Phenazopyridine Hcl 200 Mg Tabs (Phenazopyridine Hcl) .... Take 1 Tablet Three Times A Day As Needed For Burning On Urination 22)  Vitamin E 400 Unit Caps (Vitamin E) .... Take 1 Tab Daily 23)  Amlodipine Besylate 10 Mg Tabs (Amlodipine Besylate) .... Take 1 Tablet By Mouth Once  Daily  Allergies (verified): 1)  ! Penicillin  Past History:  Past Medical History: Reviewed history from 02/28/2010 and no changes required. ASCVD: Cath in 9/99-70% mid LAD with diffuse distal disease, 70% T1, 60% mid circumflex,       50% mid RCA, normal ejection fraction; negative stress nuclear in 07/2008 Cerebrovascular disease-carotid bruits; no focal disease in 1999, 2002 and 2006 Conduction system disease-dual-chamber pacemaker implant (Medtronic)-2003 Hx CHF with preserved LV systolic function  Hypertension   Hyperlipidemia Diabetes, type II-insulin-dependent IRRITABLE BOWEL SYNDROME (ICD-564.1)-mixed      Colonoscopy 2008 non-specific colitis, IH, [place] Diverticulosis, RECTAL ULCER 2o to ASA      Dysphagia-BaSw 2009-nonspecific esophageal motility disorder DUODENAL DIVERTICULUM   GERD w/ HH, esophigitis   COPD OSTEOARTHRITIS - bilateral TKA HYPOTHYROIDISM  IMPINGEMENT SYNDROME, left shoulder  Physician roster: Cardiology: Dietrich Pates; [facility] - manus Sidney Ace)  Past Surgical History: Dual-chamber pacemaker implant in 2003 (Medtronic) Bilateral TKA NASAL SURGERY CHOLECYSTECTOMY HEMORRHOIDECTOMY HYSTERECTOMY CARPAL TUNNEL RELEASE Thyroidectomy in 1980 for goiter Bladder Repair  Family History: Reviewed history from 02/22/2009 and no changes required. Father:deceased age 63 due to copd and chf Mother:deceased age 29 due to chf SISTER deceased with COLON CA 3 brothers 1 deceased due to lung ca 3 healhty sisters  Social History: Reviewed history from 08/29/2009 and no changes required. Retired  lives with spouse (who has dementia) Tobacco Use - No.  Alcohol Use - no Regular Exercise - no Drug Use - no pt has 4 children  Review of Systems       The patient complains of arthritis/joint pain, back pain, muscle pains/cramps, urination - excessive, and urination changes/pain.  The patient denies allergy/sinus, anemia, anxiety-new, blood in urine, breast  changes/lumps, change in vision, confusion, cough, coughing up blood, depression-new, fainting, fatigue, fever, headaches-new, hearing problems, heart murmur, heart rhythm changes, itching, menstrual pain, night sweats, nosebleeds, pregnancy symptoms, shortness of breath, skin rash, sleeping problems, sore throat, swelling of feet/legs, swollen lymph glands, thirst - excessive , urination - excessive , urine leakage, vision changes, and voice change.         All other systems were reviewed and were negative   Vital Signs:  Patient profile:   78 year old female Height:      70 inches Weight:      267 pounds BMI:     38.45 BSA:     2.36 Pulse rate:   76 / minute Pulse rhythm:   regular BP sitting:   132 / 74  (left arm) Cuff size:   large  Vitals Entered By: Ok Anis CMA (March 17, 2010 10:46 AM)  Physical Exam  Additional Exam:  On physical exam she is an obese female  skin: anicteric HEENT: normocephalic; PEERLA; no nasal or pharyngeal abnormalities neck: supple nodes: no cervical lymphadenopathy chest: clear to ausculatation and percussion heart: no murmurs, gallops, or rubs abd: soft, nontender; BS normoactive; no abdominal masses,  organomegaly; is minimal tenderness to palpation left lower quadrant without guarding  or rebound rectal: deferred ext: no cynanosis, clubbing, edema skeletal: no deformities neuro: oriented x 3; no focal abnormalities    Impression & Recommendations:  Problem # 1:  ABDOMINAL TENDERNESS, LEFT LOWER QUADRANT (ICD-789.64) Possible etiologies include ovarian pain or abdominal wall pain.  Diverticulitis or pain from other colonic abnormalities are less likely.  Although she complains of very minimal dysuria with the onset of urination,  stool cultures in the past have been negative.  Recommendations #1 repeat urinalysis #2 CT of the abdomen and pelvis #3 gyn evaluation.  Patient is already scheduled to have this done. Orders: TLB-Udip w/  Micro (81001-URINE) CT Abdomen/Pelvis with Contrast (CT Abd/Pelvis w/con)  Problem # 2:  COLONIC POLYPS, ADENOMATOUS, HX OF (ICD-V12.72) According to Dr. Cira Servant' notes she is due for followup colonoscopy in 2013  Problem # 3:  CEREBROVASCULAR DISEASE (ICD-437.9) Assessment: Comment Only  Problem # 4:  DIABETES MELLITUS, TYPE II, ON INSULIN (ICD-250.00) Assessment: Comment Only  Other Orders: TLB-BUN (Urea Nitrogen) (84520-BUN) TLB-Creatinine, Blood (82565-CREA)  Patient Instructions: 1)  Copy sent to : Newt Lukes MD 2)  Go To the lab today 3)  Your CT is scheduled on 03/20/2010 at 10:30am 4)  The medication list was reviewed and reconciled.  All changed / newly prescribed medications were explained.  A complete medication list was provided to the patient / caregiver.

## 2010-07-13 NOTE — Letter (Signed)
Summary: CT Letter  [facility]  [address] [place], [place] [address]   Phone: [phone]  Fax: [phone]    [facility]  03/17/2010  Patient's Name: [name] [name] MRN: [id number]  The [place] (Patient Protection and [employer] Act, Sections [phone] and [phone], dated March 23. 2010) mandates that we provide information to Medicare/Medicaid patients on facilities other than those owned by [employer].   Section [phone] of the Act amends the in-office ancillary services exception under [place] laws by requiring a referring physician to inform patients in writing, at the time of a referral, that the patients may obtain specified imaging services such as a CT exam from an entity other than the designated imaging equipment requested from the referring physician.  The provision specifically requires the referring physician to provide the patient with a written list of suppliers who furnish such services in the area in which the patient resides.  [facility] is pleased to provide you with the Medicare required information as it relates to CT services so that you an make an informed decision. [facility] takes pride in delivering high quality services where same day and next day exams are available. When your exam is performed at the [facility] CT scanner, the results are accessible to physicians and are retained in your electronic medical chart, where your physician can review the results at any time and compare them to any previous or future images you may have.  We hope you will continue to allow [place] the privilege of using our  services. Your scheduler has provided you a list of Imaging Centers within a radius of this office and your signature below indicates you have been given this information to make a wise choice.         Patient Signature:            Date:         The above signature verifies that the patient was given this letter and a  choice in their imaging location.

## 2010-07-13 NOTE — Assessment & Plan Note (Signed)
Summary: new / medicare / # / cd   Vital Signs:  Patient profile:   77 year old female Height:      70 inches (177.80 cm) Weight:      272.0 pounds (123.64 kg) O2 Sat:      94 % on Room air Temp:     97.7 degrees F (36.50 degrees C) oral Pulse rate:   81 / minute BP sitting:   120 / 74  (left arm) Cuff size:   large  Vitals Entered By: Orlan Leavens (August 29, 2009 1:14 PM)  O2 Flow:  Room air CC: New patient  Is Patient Diabetic? Yes Did you bring your meter with you today? No Pain Assessment Patient in pain? no        Primary Care Provider:  Newt Lukes MD  CC:  New patient .  History of Present Illness: new pt to me and our division, here to est care -  1) HTN - reports compliance with ongoing medical treatment and no changes in medication dose or frequency. denies adverse side effects related to current therapy.   2) hypothyroid - reports compliance with ongoing medical treatment and no changes in medication dose or frequency. denies adverse side effects related to current therapy except hard to slpit pill in half for combined dose  3) DM2 - on insulin and oral meds -  reports compliance with ongoing medical treatment and no changes in medication dose or frequency. denies adverse side effects related to current therapy. no hypoglycemia symptoms or events - checks cbg 2x/d before meals - 90-130  4) dyslipidemia - reports compliance with ongoing medical treatment and no changes in medication dose or frequency. denies adverse side effects related to current therapy.   5) hx H. pylori gastrirs - also IBS dx - denies stomach pains at this time  6) c/o burning with urination - +hx UTI - denies flank pain or hematuria - symptoms worse at night -   Preventive Screening-Counseling & Management  Alcohol-Tobacco     Alcohol drinks/day: 0     Smoking Status: never  Caffeine-Diet-Exercise     Does Patient Exercise: no  Safety-Violence-Falls     Seat Belt Use: yes    Firearms in the Home: no firearms in the home     Smoke Detectors: yes  Clinical Review Panels:  Immunizations   Last Tetanus Booster:  Td (08/29/2009)   Last Flu Vaccine:  Historical (03/11/2009)   Last Pneumovax:  Historical (03/11/2009)  CBC   WBC:  5.9 (04/06/2009)   RBC:  4.62 (04/06/2009)   Hgb:  14.2 (04/06/2009)   Hct:  41.8 (04/06/2009)   Platelets:  201 (04/06/2009)   MCV  90.5 (04/06/2009)   MCHC  34.0 (04/06/2009)   RDW  12.8 (04/06/2009)   PMN:  43 (04/06/2009)   Lymphs:  48 (04/06/2009)   Monos:  6 (04/06/2009)   Eosinophils:  3 (04/06/2009)   Basophil:  0 (04/06/2009)  Complete Metabolic Panel   Glucose:  98 (02/22/2009)   Sodium:  140 (02/22/2009)   Potassium:  4.6 (02/22/2009)   Chloride:  103 (02/22/2009)   CO2:  24 (02/22/2009)   BUN:  13 (02/22/2009)   Creatinine:  0.82 (02/22/2009)   Albumin:  4.3 (02/22/2009)   Total Protein:  7.4 (02/22/2009)   Calcium:  9.7 (02/22/2009)   Total Bili:  0.6 (02/22/2009)   Alk Phos:  79 (02/22/2009)   SGPT (ALT):  16 (02/22/2009)   SGOT (AST):  25 (02/22/2009)   Current Medications (verified): 1)  Levoxyl 200 Mcg Tabs (Levothyroxine Sodium) .... Take One Table By Mouth T,th,s,s 2)  Nitrolingual 0.4 Mg/spray Soln (Nitroglycerin) .... Inhale 1 Spray As Directed Every Two Hours 3)  Humulin N 100 Unit/ml Susp (Insulin Isophane Human) .Marland Kitchen.. 10 Units in The Am and 10 Units in The  Pm 4)  Levoxyl 25 Mcg Tabs (Levothyroxine Sodium) .... Take One Half Tablet in Addition The The 200 Micrograms Tablet 5)  Alprazolam 0.5 Mg Tabs (Alprazolam) .... Take Two Tablets At Bedtime 6)  Metoprolol Succinate 50 Mg Xr24h-Tab (Metoprolol Succinate) .... Take 1/2 By Mouth Qd 7)  Actos 45 Mg Tabs (Pioglitazone Hcl) .... Take 1 Tablet By Mouth Once A Day 8)  Abc Plus  Tabs (Multiple Vitamins-Minerals) .... Take 1 Tablet By Mouth Once A Day 9)  Aspirin 81 Mg Tbec (Aspirin) .... Take One Tablet By Mouth Daily 10)  Vitamin E 1000 Unit Caps  (Vitamin E) .... Take 1 Tablet By Mouth Once A Day 11)  Dicyclomine Hcl 10 Mg Caps (Dicyclomine Hcl) .... One By Mouth Before Breakfast, Lunch, Dinner & Bedtime For Diarrhea 12)  Amlodipine Besy-Benazepril Hcl 2.5-10 Mg Caps (Amlodipine Besy-Benazepril Hcl) .... 1/2 By Mouth Daily 13)  Crestor 10 Mg Tabs (Rosuvastatin Calcium) .... One By Mouth Qhs 14)  Align  Caps (Probiotic Product) .... One By Mouth Daily For Diarrhea 15)  Aciphex 20 Mg Tbec (Rabeprazole Sodium) .... Take 1 By Mouth Qd  Allergies (verified): 1)  ! Penicillin  Past History:  Past Medical History: IRRITABLE BOWEL SYNDROME (ICD-564.1)-mixed Colonoscopy 2008 non-specific colitis, IH, [place] Diverticulosis, RECTAL ULCER 2o to ASA Dysphagia-BaSw 2009-nonspecific esophageal motility disorder DUODENAL DIVERTICULUM GERD w/ HH, esophigitis DIABETES type 2 - insulin dep CARDIAC PACEMAKER IN SITU (ICD-V45.01) COPD OSTEOARTHRITIS - bilateral knees HYPOTHYROIDISM  CHRONIC DIASTOLIC HEART FAILURE (ICD-428.32) IMPINGEMENT SYNDROME, left shoulder Hyperlipidemia  MD rooster - cards - [facility] - GI -   Past Surgical History: [place]:  BILATERAL KNEE REPLACEMENTS NASAL SURGERY CHOLECYSTECTOMY HEMORRHOIDECTOMY HYSTERECTOMY CARPAL TUNNEL RELEASE  Family History: Reviewed history from 02/22/2009 and no changes required. Father:deceased age 22 due to copd and chf Mother:deceased age 77 due to chf SISTER deceased with COLON CA 3 brothers 1 deceased due to lung ca 3 healhty sisters  Social History: Retired  lives with spouse (who has dementia) Tobacco Use - No.  Alcohol Use - no Regular Exercise - no Drug Use - no pt has 4 children   Seat Belt Use:  yes  Review of Systems       see HPI above. I have reviewed all other systems and they were negative.   Physical Exam  General:  overweight-appearing.  alert, well-developed, well-nourished, and cooperative to examination.    Eyes:  vision grossly intact; pupils  equal, round and reactive to light.  conjunctiva and lids normal.    Ears:  normal pinnae bilaterally, without erythema, swelling, or tenderness to palpation. TMs clear, without effusion, or cerumen impaction. Hearing grossly normal bilaterally  Mouth:  teeth and gums in good repair; mucous membranes moist, without lesions or ulcers. oropharynx clear without exudate, no  erythema.  Lungs:  normal respiratory effort, no intercostal retractions or use of accessory muscles; normal breath sounds bilaterally - no crackles and no wheezes.    Heart:  normal rate, regular rhythm, no murmur, and no rub. BLE with 2+ chronic edema. normal DP pulses and normal cap refill in all 4 extremities    Abdomen:  obese,  soft, non-tender, normal bowel sounds, no distention; no masses and no appreciable hepatomegaly or splenomegaly.   Msk:  bilateral knee: decreased range of motion, diffuse boggy synovitis. Tender to palpation on joint line. Increased pain with weight bearing. Positive crepitus.  Neurologic:  alert & oriented X3 and cranial nerves II-XII symetrically intact.  strength normal in all extremities, sensation intact to light touch, and gait normal. speech fluent without dysarthria or aphasia; follows commands with good comprehension.  Skin:  no rashes, vesicles, ulcers, or erythema. No nodules or irregularity to palpation.  Psych:  Oriented X3, memory intact for recent and remote, normally interactive, good eye contact, not anxious appearing, not depressed appearing, and not agitated.      Impression & Recommendations:  Problem # 1:  DIABETES MELLITUS, TYPE II, ON INSULIN, CONTROLLED (ICD-250.00)  Her updated medication list for this problem includes:    Humulin N 100 Unit/ml Susp (Insulin isophane human) .Marland KitchenMarland KitchenMarland KitchenMarland Kitchen 10 units in the am and 10 units in the  pm    Actos 45 Mg Tabs (Pioglitazone hcl) .Marland Kitchen... Take 1 tablet by mouth once a day    Amlodipine Besy-benazepril Hcl 2.5-10 Mg Caps (Amlodipine besy-benazepril  hcl) .Marland Kitchen... 1/2 by mouth daily    Aspirin 81 Mg Tbec (Aspirin) .Marland Kitchen... Take one tablet by mouth daily  Orders: TLB-Microalbumin/Creat Ratio, Urine (82043-MALB) TLB-A1C / Hgb A1C (Glycohemoglobin) (83036-A1C)  Labs Reviewed: Creat: 0.82 (02/22/2009)     Problem # 2:  HYPERTENSION (ICD-401.9)  Her updated medication list for this problem includes:    Metoprolol Succinate 50 Mg Xr24h-tab (Metoprolol succinate) .Marland Kitchen... Take 1/2 by mouth qd    Amlodipine Besy-benazepril Hcl 2.5-10 Mg Caps (Amlodipine besy-benazepril hcl) .Marland Kitchen... 1/2 by mouth daily    Furosemide 40 Mg Tabs (Furosemide) .Marland Kitchen... 1 by mouth  every morning and 1 by mouth at lunch  Orders: TLB-Creatinine, Blood (82565-CREA)  BP today: 120/74 Prior BP: 158/78 (04/06/2009)  Labs Reviewed: K+: 4.6 (02/22/2009) Creat: : 0.82 (02/22/2009)     Problem # 3:  HYPERLIPIDEMIA (ICD-272.4)  Her updated medication list for this problem includes:    Crestor 10 Mg Tabs (Rosuvastatin calcium) ..... One by mouth qhs  Orders: TLB-Lipid Panel (80061-LIPID)  Labs Reviewed: SGOT: 25 (02/22/2009)   SGPT: 16 (02/22/2009)  Problem # 4:  CHRONIC DIASTOLIC HEART FAILURE (ICD-428.32) lasix to AM and noon to minimize nocturia symptoms -  follows with cards for same -  +edema LE.Marland Kitchen?chronic Her updated medication list for this problem includes:    Metoprolol Succinate 50 Mg Xr24h-tab (Metoprolol succinate) .Marland Kitchen... Take 1/2 by mouth qd    Amlodipine Besy-benazepril Hcl 2.5-10 Mg Caps (Amlodipine besy-benazepril hcl) .Marland Kitchen... 1/2 by mouth daily    Aspirin 81 Mg Tbec (Aspirin) .Marland Kitchen... Take one tablet by mouth daily    Furosemide 40 Mg Tabs (Furosemide) .Marland Kitchen... 1 by mouth  every morning and 1 by mouth at lunch  Problem # 5:  HYPOTHYROIDISM (ICD-244.9) pt would like to take 1-112 tabs if possible for ease of med splitting -  will check TSH before advising on this Her updated medication list for this problem includes:    Levoxyl 200 Mcg Tabs (Levothyroxine  sodium) .Marland Kitchen... Take one table by mouth t,th,s,s    Levoxyl 25 Mcg Tabs (Levothyroxine sodium) .Marland Kitchen... Take one half tablet in addition the the 200 micrograms tablet  Orders: TLB-TSH (Thyroid Stimulating Hormone) (84443-TSH)  Problem # 6:  OSTEOARTHRITIS, KNEES, BILATERAL (ICD-715.96) remote TKR on both -  chronic mild pain - consider ortho eval for  eval and tx as needed  Her updated medication list for this problem includes:    Aspirin 81 Mg Tbec (Aspirin) .Marland Kitchen... Take one tablet by mouth daily  Problem # 7:  DYSURIA (ICD-788.1) pt reports freq UTIs... check now given inc noucturia symptoms and burning - tx as needed   Orders: UA Dipstick w/Micro (automated) (81001) TLB-Udip w/ Micro (81001-URINE) T-Culture, Urine ([id number])  Complete Medication List: 1)  Levoxyl 200 Mcg Tabs (Levothyroxine sodium) .... Take one table by mouth t,th,s,s 2)  Levoxyl 25 Mcg Tabs (Levothyroxine sodium) .... Take one half tablet in addition the the 200 micrograms tablet 3)  Humulin N 100 Unit/ml Susp (Insulin isophane human) .Marland Kitchen.. 10 units in the am and 10 units in the  pm 4)  Actos 45 Mg Tabs (Pioglitazone hcl) .... Take 1 tablet by mouth once a day 5)  Nitrolingual 0.4 Mg/spray Soln (Nitroglycerin) .... Inhale 1 spray as directed every two hours 6)  Alprazolam 0.5 Mg Tabs (alprazolam)  .... Take two tablets at bedtime 7)  Metoprolol Succinate 50 Mg Xr24h-tab (Metoprolol succinate) .... Take 1/2 by mouth qd 8)  Crestor 10 Mg Tabs (Rosuvastatin calcium) .... One by mouth qhs 9)  Amlodipine Besy-benazepril Hcl 2.5-10 Mg Caps (Amlodipine besy-benazepril hcl) .... 1/2 by mouth daily 10)  Abc Plus Tabs (Multiple vitamins-minerals) .... Take 1 tablet by mouth once a day 11)  Aspirin 81 Mg Tbec (Aspirin) .... Take one tablet by mouth daily 12)  Vitamin E 1000 Unit Caps (Vitamin e) .... Take 1 tablet by mouth once a day 13)  Dicyclomine Hcl 10 Mg Caps (Dicyclomine hcl) .... One by mouth before breakfast, lunch,  dinner & bedtime for diarrhea 14)  Align Caps (Probiotic product) .... One by mouth daily for diarrhea 15)  Aciphex 20 Mg Tbec (Rabeprazole sodium) .... Take 1 by mouth qd 16)  Furosemide 40 Mg Tabs (Furosemide) .Marland Kitchen.. 1 by mouth  every morning and 1 by mouth at lunch  Other Orders: TD Toxoids IM 7 YR + (43329) Admin 1st Vaccine (51884)  Patient Instructions: 1)  it was good to see you today. 2)  test(s) ordered today - your results will be posted on the phone tree for review in 48-72 hours from the time of test completion; call [phone] and enter your 9 digit MRN (listed above on this page, just below your name); if any changes need to be made or there are abnormal results, you will be contacted directly. 3)  will send for copies of your records from dr. Felecia Shelling to review 4)  chnage your fluid pill as discussed - if there are other changes, we will contact you after reviewing your labs - 5)  Please schedule a follow-up appointment in 3-6 months, sooner if problems.  Prescriptions: FUROSEMIDE 40 MG TABS (FUROSEMIDE) 1 by mouth  every morning and 1 by mouth at lunch  #60 x 6   Entered and Authorized by:   Newt Lukes MD   Signed by:   Newt Lukes MD on 08/29/2009   Method used:   Historical   RxID:   [id number]    Immunization History:  Influenza Immunization History:    Influenza:  historical (03/11/2009)  Pneumovax Immunization History:    Pneumovax:  historical (03/11/2009)  Immunizations Administered:  Tetanus Vaccine:    Vaccine Type: Td    Site: left deltoid    Mfr: [employer]i Pasteur    Dose: 0.5 ml    Route: IM    Given by:  Lucy Brand    Exp. Date: 06/24/2011    Lot #: [id number]    VIS given: 08/29/09

## 2010-07-13 NOTE — Assessment & Plan Note (Signed)
Summary: POST HOSP /NWS  #   Vital Signs:  Patient profile:   77 year old female Height:      70 inches (177.80 cm) Weight:      258.8 pounds (117.64 kg) O2 Sat:      97 % on Room air Temp:     97.2 degrees F (36.22 degrees C) oral Pulse rate:   76 / minute BP sitting:   128 / 70  (left arm) Cuff size:   large  Vitals Entered By: Orlan Leavens (December 26, 2009 1:25 PM)  O2 Flow:  Room air CC: Hospital follow-up Is Patient Diabetic? Yes Did you bring your meter with you today? No Pain Assessment Patient in pain? no      Comments Pt states they told her to hold her Diltiazem, furosemide, and benazapril. Not sure if she suppose to be taking gabapentin if so will need rx. Also want to discuss going back on BP med that was d/c back in September due to price. Also pt states she had to go back to ER last night due to back pain   Primary Care Provider:  Newt Lukes MD  CC:  Hospital follow-up.  History of Present Illness: here for hosp and ER f/u: mch 7/2-7/3 and er 7/18 : tesst and dc summary resports reviewed  c/o continued feeling fatigued and weak onset 6 months ago, progressive course no significant change off crestor and meclizine - still hard to stay awake during the day - occ trouble sleeping at night no fever or hematuria, no CP or SOB or cough or swelling - one fall , ? muscle weakness - no radiating pain but int numbness in both feet - labs reviewed: + b12 defic identified 10/2009 - 1st shot done 10/11/09- 2nd 6/3 (taking shots at home) seen by neuro during 12/2009 hosp for fall and weakness/numbness - felt d/t dm neuropathy - started on topamax - now d + wt loss still very concerned mildew under the house is cauing her to become ill  also reviewed other med issues: 1) HTN - reports noncompliance with ongoing medical treatment; recent changes in medication reviewed - off amlodipine in lotrel due tp edema, no taking dilt b/c the opened cap casued her face to feel numb  when she smelled it and ?if that med then causes her feet to be numb!Marland Kitchen pt brought pill bottles and all reviewed today  2) hypothyroid - reports compliance with ongoing medical treatment ;no changes in medication dose or frequency. denies adverse side effects related to current therapy except hard to split pill in half for combined dose = 212.5 mcg  3) DM2 - on insulin and oral meds -  reports compliance with ongoing medical treatment; interval changes in medication reviewed - now off actos but wants to resume, on generic amaryl. denies adverse side effects related to current therapy. no hypoglycemia symptoms or events - checks cbg 2x/d before meals - 90-130 - would like to stop insulin and take only pills  4) dyslipidemia - reports compliance with ongoing medical treatment and changes in medication reviewed - feels generally weak and c/o diffuse muscle aches (see above, no change on/off statin)  Clinical Review Panels:  Lipid Management   Cholesterol:  144 (12/11/2009)   LDL (bad choesterol):  82 (12/11/2009)   HDL (good cholesterol):  42 (12/11/2009)   Triglycerides:  101 (12/11/2009)  Diabetes Management   HgBA1C:  7.4 (12/11/2009)   Creatinine:  0.92 (12/25/2009)   Last Flu  Vaccine:  Historical (03/11/2009)   Last Pneumovax:  Historical (03/11/2009)  CBC   WBC:  7.1 (12/25/2009)   RBC:  4.41 (12/25/2009)   Hgb:  13.9 (12/25/2009)   Hct:  41.0 (12/25/2009)   Platelets:  220 (12/25/2009)   MCV  92.8 (12/25/2009)   MCHC  34.3 (10/11/2009)   RDW  13.4 (12/25/2009)   PMN:  49 (12/25/2009)   Lymphs:  34.4 (10/11/2009)   Monos:  9 (12/25/2009)   Eosinophils:  2 (12/25/2009)   Basophil:  1 (12/25/2009)  Complete Metabolic Panel   Glucose:  111 (12/25/2009)   Sodium:  135 (12/25/2009)   Potassium:  4.1 (12/25/2009)   Chloride:  103 (12/25/2009)   CO2:  25 (12/25/2009)   BUN:  14 (12/25/2009)   Creatinine:  0.92 (12/25/2009)   Albumin:  4.0 (10/11/2009)   Total Protein:  7.1  (10/11/2009)   Calcium:  10.0 (12/25/2009)   Total Bili:  0.5 (10/11/2009)   Alk Phos:  87 (10/11/2009)   SGPT (ALT):  27 (10/11/2009)   SGOT (AST):  36 (10/11/2009)   Current Medications (verified): 1)  Levoxyl 200 Mcg Tabs (Levothyroxine Sodium) .... Take 1 By Mouth Once Daily 2)  Levoxyl 25 Mcg Tabs (Levothyroxine Sodium) .... Take One Half Tablet in Addition The The 200 Micrograms Tablet 3)  Humulin N 100 Unit/ml Susp (Insulin Isophane Human) .Marland Kitchen.. 10 Units in The Am and 10 Units in The  Pm 4)  Nitrolingual 0.4 Mg/spray Soln (Nitroglycerin) .... Inhale 1 Spray As Directed Every Two Hours 5)  Metoprolol Succinate 50 Mg Xr24h-Tab (Metoprolol Succinate) .... Take 1/2 By Mouth Qd 6)  Diltiazem Hcl Er Beads 240 Mg Xr24h-Cap (Diltiazem Hcl Er Beads) .... Take One Capsule By Mouth Daily 7)  Aspirin 81 Mg Tbec (Aspirin) .... Take One Tablet By Mouth Daily 8)  Vitamin E 1000 Unit Caps (Vitamin E) .... Take 1 Tablet By Mouth Once A Day 9)  Furosemide 40 Mg Tabs (Furosemide) .Marland Kitchen.. 1 By Mouth Daily 10)  Gabapentin 300 Mg Caps (Gabapentin) .... Take 1 By Mouth Once Daily As Needed 11)  Alprazolam 0.5 Mg Tabs (Alprazolam) .... Take 1 Two Times A Day As Needed - To Fill June 2 , 2011 12)  Vitamin D3 1000 Unit Tabs (Cholecalciferol) .... Take 1 Cap Daily 13)  Proventil Hfa 108 (90 Base) Mcg/act Aers (Albuterol Sulfate) .... Use As Needed 14)  Omeprazole 20 Mg Cpdr (Omeprazole) .... Take 1 By Mouth Qd 15)  Cyanocobalamin 1000 Mcg/ml Soln (Cyanocobalamin) .... Take 1 Injection Im Q Month 16)  Bd Luer-Lok Syringe 23g X 1-1/4" 3 Ml Misc (Syringe/needle (Disp)) .... Use 1 Q Month To Dispense B12 17)  Actos 30 Mg Tabs (Pioglitazone Hcl) .... Take 1 Tablet By Mouth Once A Day 18)  Benazepril Hcl 20 Mg Tabs (Benazepril Hcl) .... Take 1 Tab Daily 19)  Hydrocodone-Acetaminophen 5-500 Mg Tabs (Hydrocodone-Acetaminophen) .... Take 1 Q 6 Hours As Needed 20)  Detrol La 4 Mg Xr24h-Cap (Tolterodine Tartrate) .... Take  1 Cap Daily 21)  Amitriptyline Hcl 10 Mg Tabs (Amitriptyline Hcl) .Marland Kitchen.. 1 or 2 Tab By Mouth At Bedtime 22)  Klor-Con M20 20 Meq Cr-Tabs (Potassium Chloride Crys Cr) .Marland Kitchen.. 1 By Mouth Once Daily 23)  Topamax 50 Mg Tabs (Topiramate) .... Take 1 By Mouth Two Times A Day  Allergies (verified): 1)  ! Penicillin  Past History:  Past medical, surgical, family and social histories (including risk factors) reviewed, and no changes noted (except as noted below).  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 History of congestive heart failure with preserved LV systolic function Hypertension  Hyperlipidemia    Diabetes, type II-insulin-dependent IRRITABLE BOWEL SYNDROME (ICD-564.1)-mixed      Colonoscopy 2008 non-specific colitis, IH, [place] Diverticulosis, RECTAL ULCER 2o to ASA      Dysphagia-BaSw 2009-nonspecific esophageal motility disorder DUODENAL DIVERTICULUM GERD w/ HH, esophigitis   COPD OSTEOARTHRITIS - bilateral TKA HYPOTHYROIDISM  IMPINGEMENT SYNDROME, left shoulder  Physician roster: Cardiology: Dietrich Pates; [facility] - manus Sidney Ace)  Past Surgical History: Reviewed history from 09/15/2009 and no changes required. Dual-chamber pacemaker implant in 2003 (Medtronic) Bilateral TKA NASAL SURGERY CHOLECYSTECTOMY HEMORRHOIDECTOMY HYSTERECTOMY CARPAL TUNNEL RELEASE Thyroidectomy in 1980 for goiter  Family History: Reviewed history from 02/22/2009 and no changes required. Father:deceased age 61 due to copd and chf Mother:deceased age 65 due to chf SISTER deceased with COLON CA 3 brothers 1 deceased due to lung ca 3 healhty sisters  Social History: Reviewed history from  08/29/2009 and no changes required. Retired  lives with spouse (who has dementia) Tobacco Use - No.  Alcohol Use - no Regular Exercise - no Drug Use - no pt has 4 children  Review of Systems       see HPI above. I have reviewed all other systems and they were negative.   Physical Exam  General:  overweight-appearing.  alert, well-developed, well-nourished, and cooperative to examination.  neighbor at side (caregiver to pt's special needs g-child)  Eyes:  vision grossly intact; pupils equal, round and reactive to light.  conjunctiva and lids normal.    Lungs:  normal respiratory effort, no intercostal retractions or use of accessory muscles; normal breath sounds bilaterally - no crackles and no wheezes.    Heart:  normal rate, regular rhythm, no murmur, and no rub. BLE with trace chronic edema. normal DP pulses and normal cap refill in all 4 extremities    Neurologic:  alert & oriented X3 and cranial nerves II-XII symetrically intact.  strength normal in all extremities, sensation intact to light touch, and gait  slow but normal with asst RW. speech fluent without dysarthria or aphasia; follows commands with good comprehension.  Skin:  candida under breasts and groins Psych:  simple but oriented X3, memory intact for recent and remote, normally interactive, good eye contact, not anxious appearing, mild depressed appearing, and not agitated.      Impression & Recommendations:  Problem # 1:  PERIPHERAL NEUROPATHY (ICD-356.9)  recent hosp 12/2009 including labs, tests and neuro consult reviewed - no cervical stenosis - stop topamax d/t weight loss and diarrhea; resume gabapentin inc elavil (poor sleep also reported) arrange emg/pncs as per neuro (sethi) recs from hosp comsult Time spent with patient and friend 45 minutes, more than 50% of this time was spent counseling patient on neuropathy, meds and recent hosp + er eval and need for further eval  Orders: Neurology Referral  (Neuro)  Problem # 2:  HYPERTENSION (ICD-401.9) not taking dilt due to ?numbness in face when she smells the med- off amlodipine due to edema (which is improved) Her updated medication list for this problem includes:    Metoprolol Succinate 50 Mg Xr24h-tab (Metoprolol succinate) .Marland Kitchen... Take 1/2 by mouth qd    Diltiazem  Hcl Er Beads 240 Mg Xr24h-cap (Diltiazem hcl er beads) ..... Hold    Benazepril Hcl 20 Mg Tabs (Benazepril hcl) .Marland Kitchen... Take 1 tab daily    Furosemide 40 Mg Tabs (Furosemide) .Marland Kitchen... 1 by mouth daily as needed for swelling  BP today: 128/70 Prior BP: 170/85 (12/07/2009)  Labs Reviewed: K+: 4.1 (12/25/2009) Creat: : 0.92 (12/25/2009)   Chol: 144 (12/11/2009)   HDL: 42 (12/11/2009)   LDL: 82 (12/11/2009)   TG: 101 (12/11/2009)  Problem # 3:  ARTHRITIS (ICD-716.90)  Problem # 4:  DIABETES MELLITUS, TYPE II, ON INSULIN (ICD-250.00)  Her updated medication list for this problem includes:    Humulin N 100 Unit/ml Susp (Insulin isophane human) .Marland KitchenMarland KitchenMarland KitchenMarland Kitchen 10 units in the am and 10 units in the  pm    Aspirin 81 Mg Tbec (Aspirin) .Marland Kitchen... Take one tablet by mouth daily    Actos 30 Mg Tabs (Pioglitazone hcl) .Marland Kitchen... Take 1 tablet by mouth once a day    Benazepril Hcl 20 Mg Tabs (Benazepril hcl) .Marland Kitchen... Take 1 tab daily  Labs Reviewed: Creat: 0.92 (12/25/2009)    Reviewed HgBA1c results: 7.4 (12/11/2009)  7.2 (08/29/2009)  Complete Medication List: 1)  Levoxyl 200 Mcg Tabs (Levothyroxine sodium) .... Take 1 by mouth once daily 2)  Levoxyl 25 Mcg Tabs (Levothyroxine sodium) .... Take one half tablet in addition the the 200 micrograms tablet 3)  Humulin N 100 Unit/ml Susp (Insulin isophane human) .Marland Kitchen.. 10 units in the am and 10 units in the  pm 4)  Nitrolingual 0.4 Mg/spray Soln (Nitroglycerin) .... Inhale 1 spray as directed every two hours 5)  Metoprolol Succinate 50 Mg Xr24h-tab (Metoprolol succinate) .... Take 1/2 by mouth qd 6)  Diltiazem Hcl Er Beads 240 Mg Xr24h-cap (Diltiazem hcl er  beads) .... Hold 7)  Aspirin 81 Mg Tbec (Aspirin) .... Take one tablet by mouth daily 8)  Vitamin E 1000 Unit Caps (Vitamin e) .... Take 1 tablet by mouth once a day 9)  Gabapentin 300 Mg Caps (Gabapentin) .... Take 1 by mouth once daily as needed 10)  Alprazolam 0.5 Mg Tabs (Alprazolam) .... Take 1 two times a day as needed - to fill june 2 , 2011 11)  Vitamin D3 1000 Unit Tabs (Cholecalciferol) .... Take 1 cap daily 12)  Proventil Hfa 108 (90 Base) Mcg/act Aers (Albuterol sulfate) .... Use as needed 13)  Omeprazole 20 Mg Cpdr (Omeprazole) .... Take 1 by mouth qd 14)  Cyanocobalamin 1000 Mcg/ml Soln (Cyanocobalamin) .... Take 1 injection im q month 15)  Bd Luer-lok Syringe 23g X 1-1/4" 3 Ml Misc (Syringe/needle (disp)) .... Use 1 q month to dispense b12 16)  Actos 30 Mg Tabs (Pioglitazone hcl) .... Take 1 tablet by mouth once a day 17)  Benazepril Hcl 20 Mg Tabs (Benazepril hcl) .... Take 1 tab daily 18)  Hydrocodone-acetaminophen 5-500 Mg Tabs (Hydrocodone-acetaminophen) .... Take 1/2 -1 tab every 8 hours as needed for pain 19)  Detrol La 4 Mg Xr24h-cap (Tolterodine tartrate) .... Take 1 cap daily 20)  Amitriptyline Hcl 25 Mg Tabs (Amitriptyline hcl) .Marland Kitchen.. 1 by mouth at bedtime 21)  Furosemide 40 Mg Tabs (Furosemide) .Marland Kitchen.. 1 by mouth daily as needed for swelling 22)  Klor-con M20 20 Meq Cr-tabs (Potassium chloride crys cr) .Marland Kitchen.. 1 by mouth once daily as needed with furosemide for swelling 23)  Nystatin 100000 Unit/gm Powd (Nystatin) .... Apply to affected skin three times a day as needed for yeast  Patient Instructions: 1)  it was  good to see you today. 2)  hospitalization, ER visit and labs reviewed - 3)  stop topamax 4)  increase amitriptyline 5)  resume gabapentin 6)  pain pills refilled to use as needed  7)  no other medicine change today - continue holding the pills you were told to hold 8)  will look into getting shot in your back - we'll make referral tfor nerve tests as discussed in  hospital. Our office will contact you regarding this appointment once made.  9)  Please schedule a follow-up appointment in 3-4 weeks to continue review , sooner if problems.  Prescriptions: HYDROCODONE-ACETAMINOPHEN 5-500 MG TABS (HYDROCODONE-ACETAMINOPHEN) take 1/2 -1 tab every 8 hours as needed for pain  #30 x 1   Entered and Authorized by:   Newt Lukes MD   Signed by:   Newt Lukes MD on 12/26/2009   Method used:   Print then Give to Patient   RxID:   [id number] NYSTATIN 100000 UNIT/GM POWD (NYSTATIN) apply to affected skin three times a day as needed for yeast  #1 large x 2   Entered and Authorized by:   Newt Lukes MD   Signed by:   Newt Lukes MD on 12/26/2009   Method used:   Faxed to ...       Hospital doctor (retail)       [address]       [place], [place]  [address]       Ph: [phone] or [phone]       Fax: [phone]   RxID:   [id number] GABAPENTIN 300 MG CAPS (GABAPENTIN) take 1 by mouth once daily as needed  #30 x 5   Entered and Authorized by:   Newt Lukes MD   Signed by:   Newt Lukes MD on 12/26/2009   Method used:   Faxed to ...       Hospital doctor (retail)       [address]       [place], [place]  [address]       Ph: [phone] or [phone]       Fax: [phone]   RxID:   [id number] AMITRIPTYLINE HCL 25 MG TABS (AMITRIPTYLINE HCL) 1 by mouth at bedtime  #30 x 2   Entered and Authorized by:   Newt Lukes MD   Signed by:   Newt Lukes MD on 12/26/2009   Method used:   Faxed to ...       Hospital doctor (retail)       [address]       [place], [place]  [address]       Ph: [phone] or [phone]       Fax: [phone]   RxID:   [id number]

## 2010-07-13 NOTE — Progress Notes (Signed)
Summary: med change  Phone Note From Pharmacy   Caller: [facility] Call For: Dr. Felicity Coyer  Reason for Call: Patient requests substitution Summary of Call: Recieved fax from pharmacy stating insurance won't cover Detrol LA. Pls consider changing rx to Oxybutynin ER for insurance to cover. Is this ok? Initial call taken by: Orlan Leavens,  October 06, 2009 2:42 PM  Follow-up for Phone Call        ok - changed on med list - may send e-rx for new med as or if needed - thanks Follow-up by: Newt Lukes MD,  October 07, 2009 8:59 AM  Additional Follow-up for Phone Call Additional follow up Details #1::        Notified [facility] spoke with Sandy/pharmacist ok to change to oxybutynin ER. Updated EMR Additional Follow-up by: Orlan Leavens,  October 07, 2009 9:54 AM    New/Updated Medications: OXYBUTYNIN CHLORIDE 5 MG XR24H-TAB (OXYBUTYNIN CHLORIDE) 1 by mouth once daily OXYBUTYNIN CHLORIDE 5 MG XR24H-TAB (OXYBUTYNIN CHLORIDE) take 1 by mouth once daily Prescriptions: OXYBUTYNIN CHLORIDE 5 MG XR24H-TAB (OXYBUTYNIN CHLORIDE) take 1 by mouth once daily  #30 x 6   Entered by:   Orlan Leavens   Authorized by:   Newt Lukes MD   Signed by:   Orlan Leavens on 10/07/2009   Method used:   Telephoned to ...       Hospital doctor (retail)       [address]       [place], [place]  [address]       Ph: [phone] or [phone]       Fax: [phone]   RxID:   [id number]

## 2010-07-13 NOTE — Progress Notes (Signed)
Summary: ortho refer - pt's concern  ---- Converted from flag ---- ---- 04/20/2010 10:26 AM, Margaret Pyle, CMA wrote: [name] called stating that [name] not [facility] her financial aid therefore she would have to pay her copay of $40 which she does not have. Pt advised that MD within [facility] system are the only one's that accept her "financial aid". After checking with [facility] I was advised that [facility] does not have Ortho MDs and I advised pt of this. Pt says that she will no go to appt becasue she cannot afford to pay but she will check with [facility] to see if they will accept the financial aid? ------------------------------  ok - above noted - thanks - can refer to [facility] if pt finds that she can go there

## 2010-07-13 NOTE — Progress Notes (Signed)
Summary: Referral  Phone Note Call from Patient Call back at Home Phone [phone]   Caller: Patient Summary of Call: Pt called stating that she is still having pain "down there" and is requesting appt with Dr Darrick Penna in [place] (IM and GI specialty) to have colon checked. Per OV for same MD advised GYN referral for pain. Please advise? Initial call taken by: Margaret Pyle, CMA,  March 10, 2010 9:52 AM  Follow-up for Phone Call        feel gyn eval more appropr so willplace order for same - but given pt pref, will also refer to fields as requested Follow-up by: Newt Lukes MD,  March 10, 2010 10:58 AM  Additional Follow-up for Phone Call Additional follow up Details #1::        Pt informed and will expect a call from [facility] with appt info. Additional Follow-up by: Margaret Pyle, CMA,  March 10, 2010 11:13 AM  New Problems: UNSPEC SYMPTOM ASSOC W/FEMALE GENITAL ORGANS (ICD-625.9)   New Problems: UNSPEC SYMPTOM ASSOC W/FEMALE GENITAL ORGANS (ICD-625.9)

## 2010-07-13 NOTE — Progress Notes (Signed)
Summary: Increased BP & HR  Phone Note Call from Patient   Caller: Patient [phone] Summary of Call: Pt called stating that she has been experiencing heart racing and sweating especially at night. Pt says that she was told by daughter-in-law [name] that Metoprolol is causing increased heart rate. Pt is however, requesing to re-start Diltiazem and Metoprolol. Request discussed with Dr. Felicity Coyer who agreed that pt should continue Metoprolol and re-start Diltiazem. Pt informed, and agrees to re-start medications. Initial call taken by: Margaret Pyle, CMA,  January 24, 2010 4:24 PM  Follow-up for Phone Call        above reviewed and agree as noted Follow-up by: Newt Lukes MD,  January 24, 2010 5:05 PM    New/Updated Medications: DILTIAZEM HCL ER BEADS 240 MG XR24H-CAP (DILTIAZEM HCL ER BEADS) 1 tab by mouth once daily Prescriptions: DILTIAZEM HCL ER BEADS 240 MG XR24H-CAP (DILTIAZEM HCL ER BEADS) 1 tab by mouth once daily  #30 x 5   Entered by:   Margaret Pyle, CMA   Authorized by:   Newt Lukes MD   Signed by:   Margaret Pyle, CMA on 01/24/2010   Method used:   Faxed to ...       Hospital doctor (retail)       [address]       [place], [place]  [address]       Ph: [phone] or [phone]       Fax: [phone]   RxID:   [id number]

## 2010-07-13 NOTE — Progress Notes (Signed)
Summary: Medication Changes  Phone Note Call from Patient Call back at [phone] (after 1pm)   Reason for Call: Talk to Nurse Summary of Call: pt wants to speak with nurse regarding medicine changes from office visit on 09/15/09 with Dr.Zoejane Gaulin/tg Initial call taken by: Raechel Ache [facility],  September 16, 2009 9:37 AM    Prescriptions: AMARYL 2 MG TABS (GLIMEPIRIDE) Take 1 tablet by mouth once a day  #30 x 3   Entered by:   Teressa Lower RN   Authorized by:   Kathlen Brunswick, MD, [facility]   Signed by:   Teressa Lower RN on 09/16/2009   Method used:   Faxed to ...       Hospital doctor (retail)       [address]       [place], [place]  [address]       Ph: [phone] or [phone]       Fax: [phone]   RxID:   [id number]

## 2010-07-13 NOTE — Progress Notes (Signed)
Summary: Test Results / Medicine Concerns  Phone Note Call from Patient   Caller: Patient Reason for Call: Lab or Test Results Summary of Call: S:Pt wants results of tests that was done last week/also has concerns regarding medicines/tg B:Pt has CXR and carotid duplex on 09/23/2009, both neg; 50-69% blockage on carotids A:  pt started on amaryl 2 mg daily on 09/17/2009,  pt states her cbg averaging 120's, she wants to go back on actos R: asked pt to continue her amaryl, until response from MD  Teressa Lower RN  October 03, 2009 12:02 PM   Initial call taken by: Raechel Ache [facility],  October 03, 2009 10:18 AM  Follow-up for Phone Call        Previous carotid US showed no stenosis, so 50-79%, even though not severe, represents significant progression and will require continuing close followup.  Chest x-ray was normal.  Fasting blood glucose values of 120s are quite good.  I would not recommend return to Actos.  If she continues to have concerns, she should discuss with Dr. Felicity Coyer.  Schedule repeat carotid ultrasound in one year and followup with me as planned. Follow-up by: Kathlen Brunswick, MD, [facility],  October 06, 2009 9:01 AM  Additional Follow-up for Phone Call Additional follow up Details #1::        LMOM. Additional Follow-up by: Larita Fife Via LPN,  October 06, 2009 11:40 AM    Additional Follow-up for Phone Call Additional follow up Details #2::    Pt. advised and states she understands info. given. Follow-up by: Larita Fife Via LPN,  October 06, 2009 12:06 PM

## 2010-07-13 NOTE — Progress Notes (Signed)
Summary: phenazopyridine  Phone Note Refill Request Message from:  Fax from Pharmacy on January 02, 2010 1:28 PM  Refills Requested: Medication #1:  Phenazopyridine 200mg  # 6 take 1 tablet tid prn for burning on urination   Last Refilled: 12/30/2009 [facility] pharmacy Is this ok to refill?   Next Appointment Scheduled: 01/16/10 Initial call taken by: Orlan Leavens RMA,  January 02, 2010 2:29 PM  Follow-up for Phone Call        ok to refill x 1 Follow-up by: Newt Lukes MD,  January 02, 2010 1:54 PM    New/Updated Medications: PHENAZOPYRIDINE HCL 200 MG TABS (PHENAZOPYRIDINE HCL) take 1 tablet three times a day as needed for burning on urination Prescriptions: PHENAZOPYRIDINE HCL 200 MG TABS (PHENAZOPYRIDINE HCL) take 1 tablet three times a day as needed for burning on urination  #6 x 1   Entered by:   Orlan Leavens RMA   Authorized by:   Newt Lukes MD   Signed by:   Orlan Leavens RMA on 22-Apr-202011   Method used:   Faxed to ...       Hospital doctor (retail)       [address]       [place], [place]  [address]       Ph: [phone] or [phone]       Fax: [phone]   RxID:   [id number]

## 2010-07-13 NOTE — Progress Notes (Signed)
Summary: LAB RESULTS  Phone Note Call from Patient Call back at Home Phone [phone]   Reason for Call: Lab or Test Results Summary of Call: PT WANT LABS RESULTS Initial call taken by: Faythe Ghee,  December 08, 2009 3:44 PM  Follow-up for Phone Call        sent in mail Follow-up by: Teressa Lower RN,  December 27, 2009 3:36 PM

## 2010-07-13 NOTE — Miscellaneous (Signed)
Summary: LABS CMP,LIPIDS,07/12/2008  Clinical Lists Changes  Observations: Added new observation of CALCIUM: 9.7 mg/dL (81/19/1478 29:56) Added new observation of ALBUMIN: 4.4 g/dL (21/30/8657 84:69) Added new observation of PROTEIN, TOT: 7.6 g/dL (62/95/2841 32:44) Added new observation of SGPT (ALT): 19 units/L (07/21/2008 10:36) Added new observation of SGOT (AST): 23 units/L (07/21/2008 10:36) Added new observation of ALK PHOS: 83 units/L (07/21/2008 10:36) Added new observation of CREATININE: 0.81 mg/dL (06/13/7251 66:44) Added new observation of BUN: 14 mg/dL (03/47/4259 56:38) Added new observation of BG RANDOM: 109 mg/dL (75/64/3329 51:88) Added new observation of CO2 PLSM/SER: 24 meq/L (07/21/2008 10:36) Added new observation of CL SERUM: 102 meq/L (07/21/2008 10:36) Added new observation of K SERUM: 4.3 meq/L (07/21/2008 10:36) Added new observation of NA: 140 meq/L (07/21/2008 10:36) Added new observation of LDL: 75 mg/dL (41/66/0630 16:01) Added new observation of HDL: 46 mg/dL (09/32/3557 32:20) Added new observation of TRIGLYC TOT: 232 mg/dL (25/42/7062 37:62) Added new observation of CHOLESTEROL: 167 mg/dL (83/15/1761 60:73)

## 2010-07-13 NOTE — Letter (Signed)
Summary: [place] [facility], agricultural at [facility]  [address] [place] [address]   Phone: [phone]  Fax: [phone]      December 08, 2009 MRN: [id number]   [name] [name] [address] [place], [place]  [address]   Dear Ms. Lavis,  Your test ordered by Selena Batten has been reviewed by your physician (or physician assistant) and was found to be normal or stable. Your physician (or physician assistant) felt no changes were needed at this time.  ____ Echocardiogram  ____ Cardiac Stress Test  __X__ Lab Work  ____ Peripheral vascular study of arms, legs or neck  ____ CT scan or X-ray  ____ Lung or Breathing test  ____ Other: Please continue on current medical treatment.  Thank you.   Lewayne Bunting, MD, F.A.C.C

## 2010-07-13 NOTE — Miscellaneous (Signed)
Summary: labs per Dr.Leschber cbcd,liver,bmp,10/11/2009  Clinical Lists Changes  Observations: Added new observation of FOLATE: >20.0 (10/11/2009 9:15) Added new observation of B12: 188 pg/mL (10/11/2009 9:15) Added new observation of CALCIUM: 9.5 mg/dL (16/03/9603 5:40) Added new observation of ALBUMIN: 4.0 g/dL (98/04/9146 8:29) Added new observation of PROTEIN, TOT: 7.1 g/dL (56/21/3086 5:78) Added new observation of SGPT (ALT): 27 units/L (10/11/2009 9:15) Added new observation of SGOT (AST): 36 units/L (10/11/2009 9:15) Added new observation of ALK PHOS: 87 units/L (10/11/2009 9:15) Added new observation of BILI DIRECT: 0.2 mg/dL (46/96/2952 8:41) Added new observation of GFR: 74.34 mL/min (10/11/2009 9:15) Added new observation of CREATININE: 0.8 mg/dL (32/44/0102 7:25) Added new observation of BUN: 6 mg/dL (36/64/4034 7:42) Added new observation of BG RANDOM: 110 mg/dL (59/56/3875 6:43) Added new observation of CO2 PLSM/SER: 33 meq/L (10/11/2009 9:15) Added new observation of CL SERUM: 99 meq/L (10/11/2009 9:15) Added new observation of K SERUM: 4.0 meq/L (10/11/2009 9:15) Added new observation of NA: 139 meq/L (10/11/2009 9:15) Added new observation of PLATELETK/UL: 254.0 K/uL (10/11/2009 9:15) Added new observation of MCV: 90.6 fL (10/11/2009 9:15) Added new observation of HCT: 41.0 % (10/11/2009 9:15) Added new observation of HGB: 14.1 g/dL (32/95/1884 1:66) Added new observation of WBC COUNT: 7.0 10*3/microliter (10/11/2009 9:15)

## 2010-07-13 NOTE — Progress Notes (Signed)
  Phone Note Other Incoming   Caller: Daisy Floro [phone] ext [phone] Summary of Call: left message on triage for order for diabetic supplies for pt--informed that faxed was received and will be sent ASAP once signed Initial call taken by: Brenton Grills MA,  December 07, 2009 4:14 PM  Follow-up for Phone Call        ok - done Follow-up by: Newt Lukes MD,  December 07, 2009 4:37 PM  Additional Follow-up for Phone Call Additional follow up Details #1::        sent fax and copy to be scanned Additional Follow-up by: Brenton Grills MA,  December 07, 2009 4:56 PM

## 2010-07-13 NOTE — Procedures (Signed)
Summary: pacer check per pt request/tg   Current Medications (verified): 1)  Levoxyl 200 Mcg Tabs (Levothyroxine Sodium) .... Take One Table By Mouth T,th,s,s 2)  Nitrolingual 0.4 Mg/spray Soln (Nitroglycerin) .... Inhale 1 Spray As Directed Every Two Hours 3)  Humulin N 100 Unit/ml Susp (Insulin Isophane Human) .Marland Kitchen.. 10 Units in The Am and 10 Units in The  Pm 4)  Levoxyl 25 Mcg Tabs (Levothyroxine Sodium) .... Take One Half Tablet in Addition The The 200 Micrograms Tablet 5)  Alprazolam 0.5 Mg Tabs (Alprazolam) .... Take Two Tablets At Bedtime 6)  Metoprolol Succinate 50 Mg Xr24h-Tab (Metoprolol Succinate) .... Take One Tablet By Mouth Daily 7)  Actos 45 Mg Tabs (Pioglitazone Hcl) .... Take 1 Tablet By Mouth Once A Day 8)  Abc Plus  Tabs (Multiple Vitamins-Minerals) .... Take 1 Tablet By Mouth Once A Day 9)  Aspirin 81 Mg Tbec (Aspirin) .... Take One Tablet By Mouth Daily 10)  Vitamin E 1000 Unit Caps (Vitamin E) .... Take 1 Tablet By Mouth Once A Day 11)  Dicyclomine Hcl 10 Mg Caps (Dicyclomine Hcl) .... One By Mouth Before Breakfast, Lunch, Dinner & Bedtime For Diarrhea 12)  Amlodipine Besy-Benazepril Hcl 2.5-10 Mg Caps (Amlodipine Besy-Benazepril Hcl) .... 1/2 By Mouth Daily 13)  Crestor 10 Mg Tabs (Rosuvastatin Calcium) .... One By Mouth Qhs 14)  Align  Caps (Probiotic Product) .... One By Mouth Daily For Diarrhea  Allergies (verified): 1)  ! 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   No Battery Voltage:  2.71 V     Battery Est. Longevity:  2 years     Pacer Dependent:  Yes       PPM Device Measurements Atrium  Amplitude: 4.0 mV, Impedance: 444 ohms, Threshold: 0.5 V at 0.4 msec Right Ventricle  Impedance: 635 ohms, Threshold: 0.5 V at 0.4 msec  Episodes MS Episodes:  0     Percent Mode Switch:  0     Coumadin:  No Ventricular High Rate:  0     Atrial Pacing:  72.6%     Ventricular Pacing:  100%  Parameters Mode:  DDDR     Lower Rate Limit:  60     Upper Rate Limit:  130 Paced AV Delay:  250     Sensed AV Delay:  250 Next Cardiology Appt Due:  10/09/2009 Tech Comments:  No parameter changes.  Device function normal.  ROV 6 months Dr. Ladona Ridgel in [place]. Altha Harm, LPN  July 29, 2009 9:00 AM

## 2010-07-13 NOTE — Progress Notes (Signed)
Summary: ? dosage amlodipine/benazepril  Phone Note From Pharmacy   Caller: [facility] Request: Speak with Provider Summary of Call: recieved electronic rx for amlodipine-benazepril 2.5-10mg  take 1/2 once daily. Last rx was for amlodipine-benazepril 5/20mg  take 1 once daily. Pharmacy states cannot be halved. Last filled 2/24/11for that dosage. Initial call taken by: Orlan Leavens,  August 30, 2009 2:38 PM  Follow-up for Phone Call        ok - changed - may call or fax to let them know same  Follow-up by: Newt Lukes MD,  August 30, 2009 3:04 PM  Additional Follow-up for Phone Call Additional follow up Details #1::        notified pharm spoke with stew/pharmacist fill same amlodipine-benazepril 5/20mg . will update emr Additional Follow-up by: Orlan Leavens,  August 30, 2009 3:40 PM    New/Updated Medications: AMLODIPINE BESY-BENAZEPRIL HCL 5-20 MG CAPS (AMLODIPINE BESY-BENAZEPRIL HCL) 1 by mouth once daily Prescriptions: AMLODIPINE BESY-BENAZEPRIL HCL 5-20 MG CAPS (AMLODIPINE BESY-BENAZEPRIL HCL) 1 by mouth once daily  #30 x 11   Entered by:   Orlan Leavens   Authorized by:   Newt Lukes MD   Signed by:   Orlan Leavens on 08/30/2009   Method used:   Telephoned to ...       [facility]ospital [facility] (retail)       [address]       [place], [place]  [address]       Ph: [phone] or [phone]       Fax: [phone]   RxID:   [id number] AMLODIPINE BESY-BENAZEPRIL HCL 5-20 MG CAPS (AMLODIPINE BESY-BENAZEPRIL HCL) 1 by mouth once daily  #30 x 11   Entered and Authorized by:   Newt Lukes MD   Signed by:   Newt Lukes MD on 08/30/2009   Method used:   Historical   RxID:   [id number]

## 2010-07-13 NOTE — Progress Notes (Signed)
Summary: simvastatin  Phone Note Call from Patient Call back at [phone]   Caller: Patient Details for Reason: medication question Summary of Call: Pt called asking if she is to continue taking Simvastatin 40mg  tablets. I see in EMR where it was D/C on 12/07/09 by Dr. Lubertha Basque office. Please advise Initial call taken by: Brenton Grills MA,  February 22, 2010 3:27 PM  Follow-up for Phone Call        let her know the simvastatin is not on the list so probable should not be taking it - i recommend she ask her cardiologist office about this since their office stopped the med; as per the phone exchange last week, cardiology will handle all cardiology related drugs to lessen risk of pt/pharmacy confusion -  thanks Follow-up by: Newt Lukes MD,  February 22, 2010 3:34 PM  Additional Follow-up for Phone Call Additional follow up Details #1::        pt informed to D/C taking simvastatin and to call her cardiologist's office about medication. Pt verbalized understanding. Pt is asking if she can take a higher dosage of Gabapentin for numbness and circulation problem in legs. Please advise Additional Follow-up by: Brenton Grills MA,  February 22, 2010 3:42 PM    Additional Follow-up for Phone Call Additional follow up Details #2::    may increase to two times a day - rx fax done Newt Lukes MD  February 22, 2010 3:56 PM   Additional Follow-up for Phone Call Additional follow up Details #3:: Details for Additional Follow-up Action Taken: pt verbalized understanding Additional Follow-up by: Brenton Grills MA,  February 22, 2010 4:10 PM  New/Updated Medications: GABAPENTIN 300 MG CAPS (GABAPENTIN) take 1 by mouth two times a day Prescriptions: GABAPENTIN 300 MG CAPS (GABAPENTIN) take 1 by mouth two times a day  #60 x 1   Entered and Authorized by:   Newt Lukes MD   Signed by:   Newt Lukes MD on 02/22/2010   Method used:   Faxed to ...       [facility] (retail)       [address]       [place], [place]  [address]       Ph: [phone] or [phone]       Fax: [phone]   RxID:   [id number]

## 2010-07-13 NOTE — Progress Notes (Signed)
Summary: ? about med SE  Phone Note Call from Patient Call back at Home Phone [phone]   Caller: Patient Summary of Call: Pt has concerns about Meloxicam that was rx to her by Dr. Gwendlyn Deutscher in [facility] for her arthritis. Pt states that she read the package insert and she is concerned about increased risk of CVA and MI because of her high blood pressure. Pt was rx to take two times a day but pt wants to know if she can start by just taking once daily if the drug is safe for her to take-pt is asking for MD's advisement Initial call taken by: Brenton Grills CMA Duncan Dull),  June 21, 2010 3:53 PM  Follow-up for Phone Call        ok to take meloxicam just once daily - Follow-up by: Newt Lukes MD,  June 21, 2010 4:40 PM  Additional Follow-up for Phone Call Additional follow up Details #1::        pt informed Additional Follow-up by: Brenton Grills CMA Duncan Dull),  June 21, 2010 4:49 PM    New/Updated Medications: MOBIC 7.5 MG TABS (MELOXICAM) 1 by mouth once daily

## 2010-07-13 NOTE — Progress Notes (Signed)
Summary: med confirm  Phone Note From Pharmacy   Caller: [facility] [employer] of Call: Recieved faxed from pharmacy stating pt been taking Levoxyl once a day. rx that was sent in was stated to take 1 tues, thurs, sat and sun. Initial call taken by: Orlan Leavens,  September 06, 2009 10:36 AM  Follow-up for Phone Call        Confirm with pharmacy no changes was'nt made. will update EMR to take 1 by mouth once daily. also sent refill on furosemide again Follow-up by: Orlan Leavens,  September 06, 2009 10:39 AM    New/Updated Medications: LEVOXYL 200 MCG TABS (LEVOTHYROXINE SODIUM) take 1 by mouth once daily Prescriptions: FUROSEMIDE 40 MG TABS (FUROSEMIDE) 1 by mouth  every morning and 1 by mouth at lunch  #60 x 6   Entered by:   Orlan Leavens   Authorized by:   Newt Lukes MD   Signed by:   Orlan Leavens on 09/06/2009   Method used:   Telephoned to ...       Hospital doctor (retail)       [address]       [place], [place]  [address]       Ph: [phone] or [phone]       Fax: [phone]   RxID:   [id number] LEVOXYL 200 MCG TABS (LEVOTHYROXINE SODIUM) take 1 by mouth once daily  #30 x 3   Entered by:   Orlan Leavens   Authorized by:   Newt Lukes MD   Signed by:   Orlan Leavens on 09/06/2009   Method used:   Telephoned to ...       Hospital doctor (retail)       [address]       [place], [place]  [address]       Ph: [phone] or [phone]       Fax: [phone]   RxID:   [id number]

## 2010-07-13 NOTE — Miscellaneous (Signed)
Summary: Plan/[facility]  Plan/[facility]   Imported By: Lester Veneta 11/22/2009 14:49:01  _____________________________________________________________________  External Attachment:    Type:   Image     Comment:   External Document

## 2010-07-13 NOTE — Progress Notes (Signed)
Summary: Questions regarding working out  Phone Note Call from Patient   Caller: Patient Reason for Call: Talk to Nurse Summary of Call: patient wants to know if it is okay for to workout at the [facility] / states she has a pacemaker and that she is not going to over do it / tg Initial call taken by: Raechel Ache [facility],  June 15, 2010 2:39 PM  Follow-up for Phone Call        trainer will fax [place] a release if needed Follow-up by: Teressa Lower RN,  June 16, 2010 9:43 AM

## 2010-07-13 NOTE — Progress Notes (Signed)
Summary: Alprazolam  Phone Note Refill Request Message from:  Fax from Pharmacy on Nov 02, 2009 1:05 PM  Refills Requested: Medication #1:  ALPRAZOLAM 0.5 MG TABS take 1 two times a day   Last Refilled: 10/11/2009 [place] [phone]  Initial call taken by: Orlan Leavens,  Nov 02, 2009 1:05 PM  Follow-up for Phone Call        Med is not due until june 3rd. Per md DENIED. Faxed paper req back with info Follow-up by: Orlan Leavens,  Nov 02, 2009 1:06 PM

## 2010-07-13 NOTE — Letter (Signed)
Summary: Generic Letter  Architectural technologist at [facility]  [address] [place] [address]   Phone: [phone]  Fax: [phone]        December 27, 2009 MRN: [id number]    [name] [name] [address] [place], [place]  [address]    Dear Ms. Barocio,  We were unable to reach you by telephone.  Per your request, Dr.   Dietrich Pates stopped your diltiazem medication and restarted benazepril 20mg    daily and amlodipine 5mg  daily.  Taking two separate tablets for this   combination medicaiton will be cheaper than taking lotrel.  If you have   any questions please call our office at [phone].          Sincerely, Teressa Lower RN  This letter has been electronically signed by your physician.

## 2010-07-13 NOTE — Assessment & Plan Note (Signed)
Summary: [name] [name]   Vital Signs:  Patient profile:   77 year old female Height:      70 inches (177.80 cm) Weight:      270.2 pounds (122.82 kg) O2 Sat:      97 % on Room air Temp:     98.2 degrees F (36.78 degrees C) oral Pulse rate:   90 / minute BP sitting:   130 / 86  (left arm) Cuff size:   large  Vitals Entered By: Orlan Leavens (Oct 27, 2009 10:38 AM)  O2 Flow:  Room air CC: weak, pt states she does not feel well Is Patient Diabetic? Yes Did you bring your meter with you today? No Pain Assessment Patient in pain? no      CBG Result 179   Primary Care Provider:  Newt Lukes MD  CC:  weak and pt states she does not feel well.  History of Present Illness: c/o feeling fatigued and weak onset 3-4 months ago, progressive course hard to stay awake during the day - no trouble sleeping at night no fever or hematuria, no CP or SOB or cough or swelling - no falls, no muscle weakness - no radiating pain or numbness - denies depression or sleep problems recent labs reviewed - b12 defic identified and 1st shot done 10/11/09 very concerned mildew under the house is cauing her to become ill  also reviewed other med issues: 1) HTN - reports compliance with ongoing medical treatment and no changes in medication dose or frequency. denies adverse side effects related to current therapy.  ?"why taking 3 medications for same thing"  2) hypothyroid - reports compliance with ongoing medical treatment and no changes in medication dose or frequency. denies adverse side effects related to current therapy except hard to split pill in half for combined dose  3) DM2 - on insulin and oral meds -  reports compliance with ongoing medical treatment; interval changes in medication reviewed - now off actos but wants to resume, on generic amaryl. denies adverse side effects related to current therapy. no hypoglycemia symptoms or events - checks cbg 2x/d before meals - 90-130 - would  like to stop insulin and take only pills  4) dyslipidemia - reports compliance with ongoing medical treatment and no changes in medication dose or frequency. denies adverse side effects related to current therapy but feels generally weak and c/o diffuse muscle aches  5) hx H. pylori gastrirs - also IBS dx - denies stomach pains at this time  Current Medications (verified): 1)  Levoxyl 200 Mcg Tabs (Levothyroxine Sodium) .... Take 1 By Mouth Once Daily 2)  Levoxyl 25 Mcg Tabs (Levothyroxine Sodium) .... Take One Half Tablet in Addition The The 200 Micrograms Tablet 3)  Humulin N 100 Unit/ml Susp (Insulin Isophane Human) .Marland Kitchen.. 10 Units in The Am and 10 Units in The  Pm 4)  Nitrolingual 0.4 Mg/spray Soln (Nitroglycerin) .... Inhale 1 Spray As Directed Every Two Hours 5)  Metoprolol Succinate 50 Mg Xr24h-Tab (Metoprolol Succinate) .... Take 1/2 By Mouth Qd 6)  Crestor 10 Mg Tabs (Rosuvastatin Calcium) .... One By Mouth Qhs 7)  Diltiazem Hcl Er Beads 240 Mg Xr24h-Cap (Diltiazem Hcl Er Beads) .... Take One Capsule By Mouth Daily 8)  Aspirin 81 Mg Tbec (Aspirin) .... Take One Tablet By Mouth Daily 9)  Vitamin E 1000 Unit Caps (Vitamin E) .... Take 1 Tablet By Mouth Once A Day 10)  Furosemide 40 Mg Tabs (Furosemide) .Marland KitchenMarland KitchenMarland Kitchen 1  By Mouth  Every Morning and 1 By Mouth At Lunch 11)  Gabapentin 300 Mg Caps (Gabapentin) .... Take 1 By Mouth Once Daily As Needed 12)  Meclizine Hcl 25 Mg Tabs (Meclizine Hcl) .... Take 1 By Mouth Once Daily As Needed For Dizziness 13)  Alprazolam 0.5 Mg Tabs (Alprazolam) .... Take 1 Two Times A Day 14)  Vitamin D3 1000 Unit Tabs (Cholecalciferol) .... Take 1 Cap Daily 15)  Proventil Hfa 108 (90 Base) Mcg/act Aers (Albuterol Sulfate) .... Use As Needed 16)  Lotensin 20 Mg Tabs (Benazepril Hcl) .... Take 1 Tablet By Mouth Once A Day 17)  Amaryl 2 Mg Tabs (Glimepiride) .... Take 1 Tablet By Mouth Once A Day 18)  Aspirin 81 Mg Tbec (Aspirin) .... Take One Tablet By Mouth Daily 19)   Oxybutynin Chloride 5 Mg Xr24h-Tab (Oxybutynin Chloride) .... Take 1 By Mouth Once Daily 20)  Omeprazole 20 Mg Cpdr (Omeprazole) .... Take 1 By Mouth Qd 21)  Cyanocobalamin 1000 Mcg/ml Soln (Cyanocobalamin) .... Take 1 Injection Im Q Month 22)  Bd Luer-Lok Syringe 23g X 1-1/4" 3 Ml Misc (Syringe/needle (Disp)) .... Use 1 Q Month To Dispense B12  Allergies (verified): 1)  ! Penicillin  Past History:  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 History of congestive heart failure with preserved LV systolic function Hypertension Hyperlipidemia   Diabetes, type II-insulin-dependent IRRITABLE BOWEL SYNDROME (ICD-564.1)-mixed      Colonoscopy 2008 non-specific colitis, IH, [place] Diverticulosis, RECTAL ULCER 2o to ASA      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 Dietrich Pates; EP-Dr. Ladona Ridgel GI -   Review of Systems  The patient denies anorexia, weight loss, hoarseness, headaches, hemoptysis, abdominal pain, and suspicious skin lesions.    Physical Exam  General:  overweight-appearing.  alert, well-developed, well-nourished, and cooperative to examination.    Lungs:  normal respiratory effort, no intercostal retractions or use of accessory muscles; normal breath sounds bilaterally - no crackles and no wheezes.    Heart:  normal rate, regular rhythm, no murmur, and no rub. BLE with 2+ chronic edema. normal DP pulses and normal cap refill in all 4 extremities    Neurologic:  alert & oriented X3 and cranial  nerves II-XII symetrically intact.  strength normal in all extremities, sensation intact to light touch, and gait normal. speech fluent without dysarthria or aphasia; follows commands with good comprehension.  Psych:  simple but oriented X3, memory intact for recent and remote, normally interactive, good eye contact, not anxious appearing, mild depressed appearing, and not agitated.      Impression & Recommendations:  Problem # 1:  FATIGUE (ICD-780.79)  Orders: Home Health Referral (Home Health) Misc. Referral (Misc. Ref)  nonspecific exam and hx -  now tx ongoing for B12 defic labs done 3/21 and 5/3 reviewed - 5/3 xray to look for DDD not significant - ?med effect - hard to reconcile as pt with polypharmacy and obvious confusion re: meds (none brought with her today) will hold crestor for short time and stop sedating meclizine -  arrange for head ct r/o nph with c/o balance problems  and prior CVA also arraa=nge for [facility] PT/OT and RN to help with saftey eval and tx as able  Orders: TLB-CBC Platelet - w/Differential (85025-CBCD) TLB-Hepatic/Liver Function Pnl (80076-HEPATIC) TLB-BMP (Basic Metabolic Panel-BMET) (80048-METABOL) TLB-B12 + Folate Pnl (81191_47829-F62/ZHY)  Problem # 2:  DIABETES MELLITUS, TYPE II, ON INSULIN (ICD-250.00)  actos prev stopped due to concern for chf - but no change in edema off this med - pt says sugars uncontrolled without it and wants to resume - will do so at lower dose at this time - watch for signs vol overload exac - will recheck in few weeks, sooner if problems Her updated medication list for this problem includes:    Humulin N 100 Unit/ml Susp (Insulin isophane human) .Marland KitchenMarland KitchenMarland KitchenMarland Kitchen 10 units in the am and 10 units in the  pm    Aspirin 81 Mg Tbec (Aspirin) .Marland Kitchen... Take one tablet by mouth daily    Lotensin 20 Mg Tabs (Benazepril hcl) .Marland Kitchen... Take 1 tablet by mouth once a day    Amaryl 2 Mg Tabs (Glimepiride) .Marland Kitchen... Take 1 tablet by mouth once a day    Actos 30  Mg Tabs (Pioglitazone hcl) .Marland Kitchen... 1 by mouth once daily  Orders: Glucose, (CBG) [id number]) Home Health Referral (Home Health)  Labs Reviewed: Creat: 0.8 (10/11/2009)    Reviewed HgBA1c results: 7.2 (08/29/2009)  7.5 (12/08/2008)  Problem # 3:  VITAMIN B12 DEFICIENCY (ICD-266.2) now on monthly replacement shot  Problem # 4:  CEREBROVASCULAR DISEASE (ICD-437.9)  Orders: Misc. Referral (Misc. Ref)  Complete Medication List: 1)  Levoxyl 200 Mcg Tabs (Levothyroxine sodium) .... Take 1 by mouth once daily 2)  Levoxyl 25 Mcg Tabs (Levothyroxine sodium) .... Take one half tablet in addition the the 200 micrograms tablet 3)  Humulin N 100 Unit/ml Susp (Insulin isophane human) .Marland Kitchen.. 10 units in the am and 10 units in the  pm 4)  Nitrolingual 0.4 Mg/spray Soln (Nitroglycerin) .... Inhale 1 spray as directed every two hours 5)  Metoprolol Succinate 50 Mg Xr24h-tab (Metoprolol succinate) .... Take 1/2 by mouth qd 6)  Crestor 10 Mg Tabs (Rosuvastatin calcium) .... Hold for now 7)  Diltiazem Hcl Er Beads 240 Mg Xr24h-cap (Diltiazem hcl er beads) .... Take one capsule by mouth daily 8)  Aspirin 81 Mg Tbec (Aspirin) .... Take one tablet by mouth daily 9)  Vitamin E 1000 Unit Caps (Vitamin e) .... Take 1 tablet by mouth once a day 10)  Furosemide 40 Mg Tabs (Furosemide) .Marland Kitchen.. 1 by mouth  every morning and 1 by mouth at lunch 11)  Gabapentin 300 Mg Caps (Gabapentin) .... Take 1 by mouth once daily as needed 12)  Meclizine Hcl 25 Mg Tabs (Meclizine hcl) .... Hold 13)  Alprazolam 0.5 Mg Tabs (Alprazolam) .... Take 1 two times a day 14)  Vitamin D3 1000 Unit Tabs (Cholecalciferol) .... Take 1 cap daily 15)  Proventil Hfa 108 (90 Base) Mcg/act Aers (Albuterol sulfate) .... Use as needed 16)  Lotensin 20 Mg Tabs (Benazepril hcl) .... Take 1 tablet by mouth once a day 17)  Amaryl 2 Mg Tabs (Glimepiride) .... Take 1 tablet by mouth once a day 18)  Aspirin 81 Mg Tbec (Aspirin) .... Take one tablet by mouth  daily 19)  Oxybutynin Chloride 5 Mg Xr24h-tab (Oxybutynin chloride) .... Take 1 by mouth once daily 20)  Omeprazole 20 Mg Cpdr (Omeprazole) .... Take 1 by mouth qd 21)  Cyanocobalamin 1000 Mcg/ml Soln (Cyanocobalamin) .... Take 1 injection im q month 22)  Bd  Luer-lok Syringe 23g X 1-1/4" 3 Ml Misc (Syringe/needle (disp)) .... Use 1 q month to dispense b12 23)  Actos 30 Mg Tabs (Pioglitazone hcl) .Marland Kitchen.. 1 by mouth once daily  Patient Instructions: 1)  it was good to see you today. 2)  stop for now Crestor and Meclizine -  3)  we'll make referral for head ct scan. Our office will contact you regarding this appointment once made.  4)  resume actos at 30mg  once daily - your prescriptions have been electronically submitted to your pharmacy. Please take as directed. Contact our office if you believe you're having problems with the medication(s). watch for increased swelling in your feet 5)  bring all your medicines to your next appointment for review - 6)  Please schedule a follow-up appointment in 4 weeks, sooner if problems.  7)  try glucerna for nutritional supplement 8)  we'll make referral for home health physcial therapy. Our office will contact you regarding this appointment once made.  Prescriptions: ACTOS 30 MG TABS (PIOGLITAZONE HCL) 1 by mouth once daily  #30 x 2   Entered and Authorized by:   Newt Lukes MD   Signed by:   Newt Lukes MD on 10/27/2009   Method used:   Faxed to ...       [facility]       [address]       [place], [place]  [address]       Ph: [phone] or [phone]       Fax: [phone]   RxID:   [id number]   Laboratory Results   Blood Tests     CBG Random:: 179mg /dL

## 2010-07-13 NOTE — Assessment & Plan Note (Signed)
Summary: 3 wk f/u // needs 3pm appt per lucy / cd   Vital Signs:  Patient profile:   77 year old female Height:      70 inches (177.80 cm) Weight:      264.0 pounds (120.00 kg) BMI:     38.02 O2 Sat:      96 % on Room air Temp:     97.0 degrees F (36.11 degrees C) oral Pulse rate:   93 / minute BP sitting:   150 / 78  (left arm) Cuff size:   large  Vitals Entered By: Orlan Leavens RMA (January 16, 2010 1:05 PM)  O2 Flow:  Room air CC: 3 week follow-up Is Patient Diabetic? Yes Did you bring your meter with you today? No Pain Assessment Patient in pain? no        Primary Care Provider:  Newt Lukes MD  CC:  3 week follow-up.  History of Present Illness: here for f/u  1) HTN - reports compliance with ongoing medical treatment; recent changes in medication reviewed - off amlodipine in lotrel due to edema, not taking dilt b/c the opened cap casued her face to feel numb when she smelled it and ?if that med then causes her feet to be numb!Marland Kitchen pt brought pill bottles and all reviewed today - ?inc metoprolol since BP not controlled  2) hypothyroid - reports compliance with ongoing medical treatment ;no changes in medication dose or frequency. denies adverse side effects related to current therapy except hard to split pill in half for combined dose = 212.5 mcg  3) DM2 - on insulin and oral meds -  reports compliance with ongoing medical treatment; interval changes in medication reviewed - on actos, on generic amaryl and insulin. denies adverse side effects related to current therapy. no hypoglycemia symptoms or events - checks cbg 2x/d before meals - 90-130 - would still like to stop insulin and take only pills  4) dyslipidemia - reports remaining off medical treatment for last 2 months and feels much improved - feels general weakness has resolved and no further diffuse muscle aches  Clinical Review Panels:  Immunizations   Last Tetanus Booster:  Td (08/29/2009)   Last Flu  Vaccine:  Historical (03/11/2009)   Last Pneumovax:  Historical (03/11/2009)  Lipid Management   Cholesterol:  144 (12/11/2009)   LDL (bad choesterol):  82 (12/11/2009)   HDL (good cholesterol):  42 (12/11/2009)   Triglycerides:  101 (12/11/2009)  Diabetes Management   HgBA1C:  7.4 (12/11/2009)   Creatinine:  0.92 (12/25/2009)   Last Flu Vaccine:  Historical (03/11/2009)   Last Pneumovax:  Historical (03/11/2009)  CBC   WBC:  7.0 (12/30/2009)   RBC:  4.35 (12/30/2009)   Hgb:  13.7 (12/30/2009)   Hct:  39.7 (12/30/2009)   Platelets:  237.0 (12/30/2009)   MCV  91.3 (12/30/2009)   MCHC  34.5 (12/30/2009)   RDW  13.2 (12/30/2009)   PMN:  62.6 (12/30/2009)   Lymphs:  30.0 (12/30/2009)   Monos:  5.9 (12/30/2009)   Eosinophils:  1.1 (12/30/2009)   Basophil:  0.4 (12/30/2009)  Complete Metabolic Panel   Glucose:  111 (12/25/2009)   Sodium:  135 (12/25/2009)   Potassium:  4.1 (12/25/2009)   Chloride:  103 (12/25/2009)   CO2:  25 (12/25/2009)   BUN:  14 (12/25/2009)   Creatinine:  0.92 (12/25/2009)   Albumin:  4.0 (10/11/2009)   Total Protein:  7.1 (10/11/2009)   Calcium:  10.0 (12/25/2009)  Total Bili:  0.5 (10/11/2009)   Alk Phos:  87 (10/11/2009)   SGPT (ALT):  27 (10/11/2009)   SGOT (AST):  36 (10/11/2009)   Current Medications (verified): 1)  Levoxyl 200 Mcg Tabs (Levothyroxine Sodium) .... Take 1 By Mouth Once Daily 2)  Levoxyl 25 Mcg Tabs (Levothyroxine Sodium) .... Take One Half Tablet in Addition The The 200 Micrograms Tablet 3)  Humulin N 100 Unit/ml Susp (Insulin Isophane Human) .Marland Kitchen.. 10 Units in The Am and 10 Units in The  Pm 4)  Nitrolingual 0.4 Mg/spray Soln (Nitroglycerin) .... Inhale 1 Spray As Directed Every Two Hours 5)  Metoprolol Succinate 50 Mg Xr24h-Tab (Metoprolol Succinate) .... Take 1/2 By Mouth Qd 6)  Aspirin 81 Mg Tbec (Aspirin) .... Take One Tablet By Mouth Daily 7)  Gabapentin 300 Mg Caps (Gabapentin) .... Take 1 By Mouth Once Daily 8)   Alprazolam 0.5 Mg Tabs (Alprazolam) .... Take 1 Two Times A Day As Needed 9)  Vitamin D3 1000 Unit Tabs (Cholecalciferol) .... Take 1 Cap Daily 10)  Proventil Hfa 108 (90 Base) Mcg/act Aers (Albuterol Sulfate) .... Use As Needed 11)  Omeprazole 20 Mg Cpdr (Omeprazole) .... Take 1 By Mouth Qd 12)  Cyanocobalamin 1000 Mcg/ml Soln (Cyanocobalamin) .... Take 1 Injection Im Q Month 13)  Bd Luer-Lok Syringe 23g X 1-1/4" 3 Ml Misc (Syringe/needle (Disp)) .... Use 1 Q Month To Dispense B12 14)  Actos 30 Mg Tabs (Pioglitazone Hcl) .... Take 1 Tablet By Mouth Once A Day 15)  Benazepril Hcl 20 Mg Tabs (Benazepril Hcl) .... Take 1 Tablet By Mouth Once A Day 16)  Hydrocodone-Acetaminophen 5-500 Mg Tabs (Hydrocodone-Acetaminophen) .... Take 1/2 -1 Tab Every 8 Hours As Needed For Pain 17)  Detrol La 4 Mg Xr24h-Cap (Tolterodine Tartrate) .... Take 1 Cap Daily 18)  Amitriptyline Hcl 25 Mg Tabs (Amitriptyline Hcl) .Marland Kitchen.. 1 By Mouth At Bedtime 19)  Furosemide 40 Mg Tabs (Furosemide) .Marland Kitchen.. 1 By Mouth Daily As Needed For Swelling 20)  Klor-Con M20 20 Meq Cr-Tabs (Potassium Chloride Crys Cr) .Marland Kitchen.. 1 By Mouth Once Daily As Needed With Furosemide For Swelling 21)  Nystatin 100000 Unit/gm Powd (Nystatin) .... Apply To Affected Skin Three Times A Day As Needed For Yeast 22)  Amlodipine Besylate 5 Mg Tabs (Amlodipine Besylate) .... Take One Tablet By Mouth Daily 23)  Phenazopyridine Hcl 200 Mg Tabs (Phenazopyridine Hcl) .... Take 1 Tablet Three Times A Day As Needed For Burning On Urination  Allergies (verified): 1)  ! Penicillin  Past History:  Past medical, surgical, family and social histories (including risk factors) reviewed, and no changes noted (except as noted below).  Past Medical History: ASCVD: Cath in 9/99-70% mid LAD with diffuse distal disease, 70% T1, 60% mid circumflex,       50% mid RCA, normal ejection fraction; negative stress nuclear in 07/2008 Cerebrovascular disease-carotid bruits; no focal disease  in 1999, 2002 and 2006 Conduction system disease-dual-chamber pacemaker implant ([employer]tr[employer])-2003 Hx CHF with preserved LV systolic function  Hypertension  Hyperlipidemia Diabetes, type II-insulin-dependent IRRITABLE BOWEL SYNDROME (ICD-564.1)-mixed      Colonoscopy 2008 non-specific colitis, IH, [place] Diverticulosis, RECTAL ULCER 2o to ASA      Dysphagia-BaSw 2009-nonspecific esophageal motility disorder DUODENAL DIVERTICULUM GERD w/ HH, esophigitis   COPD OSTEOARTHRITIS - bilateral TKA HYPOTHYROIDISM  IMPINGEMENT SYNDROME, left shoulder  Physician roster: Cardiology: Dietrich Pates; [facility] - manus Sidney Ace)  Past Surgical History: Reviewed history from 09/15/2009 and no changes required. Dual-chamber pacemaker implant in 2003 (Medtronic) Bilateral TKA  NASAL SURGERY CHOLECYSTECTOMY HEMORRHOIDECTOMY HYSTERECTOMY CARPAL TUNNEL RELEASE Thyroidectomy in 1980 for goiter  Family History: Reviewed history from 02/22/2009 and no changes required. Father:deceased age 40 due to copd and chf Mother:deceased age 44 due to chf SISTER deceased with COLON CA 3 brothers 1 deceased due to lung ca 3 healhty sisters  Social History: Reviewed history from 08/29/2009 and no changes required. Retired  lives with spouse (who has dementia) Tobacco Use - No.  Alcohol Use - no Regular Exercise - no Drug Use - no pt has 4 children  Review of Systems  The patient denies fever, chest pain, headaches, and severe indigestion/heartburn.    Physical Exam  General:  overweight-appearing.  alert, well-developed, well-nourished, and cooperative to examination. spouse at side Lungs:  normal respiratory effort, no intercostal retractions or use of accessory muscles; normal breath sounds bilaterally - no crackles and no wheezes.    Heart:  normal rate, regular rhythm, no murmur, and no rub. BLE with trace chronic edema. Abdomen:  soft, non-tender, normal bowel sounds, no distention; no masses and  no appreciable hepatomegaly or splenomegaly.   Psych:  simple but oriented X3, memory intact for recent and remote, normally interactive, good eye contact, not anxious appearing, mild depressed appearing, and not agitated.      Impression & Recommendations:  Problem # 1:  ANXIETY (ICD-300.00)  Her updated medication list for this problem includes:    Alprazolam 0.5 Mg Tabs (Alprazolam) .Marland Kitchen... Take 1/2-1 tab by mouth two times a day as needed    Amitriptyline Hcl 25 Mg Tabs (Amitriptyline hcl) .Marland Kitchen... 1 by mouth at bedtime  eants to be off alprazolam as per rec of cards (11/22/09 OV with cardiology reviewed) - resumed low dose but - ?too strong -  advised to take only 1/2 tab as needed   Problem # 2:  PERIPHERAL NEUROPATHY (ICD-356.9) NCS 01/06/10 reviewed - nonspecific  again reviewed hosp 12/2009 including labs, tests and neuro consult reviewed - no cervical stenosis - symptoms improved off topamax, and resumed gabapentin cont inc elavil (poor sleep also improved)  Problem # 3:  DIABETES MELLITUS, TYPE II, ON INSULIN (ICD-250.00)  Her updated medication list for this problem includes:    Humulin N 100 Unit/ml Susp (Insulin isophane human) .Marland KitchenMarland KitchenMarland KitchenMarland Kitchen 10 units in the am and 10 units in the  pm    Aspirin 81 Mg Tbec (Aspirin) .Marland Kitchen... Take one tablet by mouth daily    Actos 30 Mg Tabs (Pioglitazone hcl) .Marland Kitchen... Take 1 tablet by mouth once a day    Benazepril Hcl 20 Mg Tabs (Benazepril hcl) .Marland Kitchen... Take 1 tablet by mouth once a day  Labs Reviewed: Creat: 0.92 (12/25/2009)    Reviewed HgBA1c results: 7.4 (12/11/2009)  7.2 (08/29/2009)  Problem # 4:  HYPERTENSION (ICD-401.9)  Her updated medication list for this problem includes:    Metoprolol Succinate 50 Mg Xr24h-tab (Metoprolol succinate) .Marland Kitchen... Take 1 by mouth once daily    Benazepril Hcl 20 Mg Tabs (Benazepril hcl) .Marland Kitchen... Take 1 tablet by mouth once a day    Furosemide 40 Mg Tabs (Furosemide) .Marland Kitchen... 1 by mouth daily as needed for swelling     Amlodipine Besylate 5 Mg Tabs (Amlodipine besylate) .Marland Kitchen... Take one tablet by mouth daily  not taking dilt due to ?numbness in face when she smells the med- reduced amlodipine due to edema (which is improved) increase Bbloc and recheck as needed - cont ACEI  BP today: 150/78 Prior BP: 132/74 (12/30/2009)  Labs Reviewed: K+:  4.1 (12/25/2009) Creat: : 0.92 (12/25/2009)   Chol: 144 (12/11/2009)   HDL: 42 (12/11/2009)   LDL: 82 (12/11/2009)   TG: 101 (12/11/2009)  Problem # 5:  HYPOTHYROIDISM (ICD-244.9)  will try to simplify replacment dosing as pt unable to cut tab in 1/2 Her updated medication list for this problem includes:    Levoxyl 200 Mcg Tabs (Levothyroxine sodium) .Marland Kitchen... Take 1 by mouth once daily    Levoxyl 25 Mcg Tabs (Levothyroxine sodium) ..... One tablet every other day (in addition the the 200 micrograms tablet)  Labs Reviewed: TSH: 1.945 (12/11/2009)    HgBA1c: 7.4 (12/11/2009) Chol: 144 (12/11/2009)   HDL: 42 (12/11/2009)   LDL: 82 (12/11/2009)   TG: 101 (12/11/2009)  Time spent with patient and spouse 25 minutes, more than 50% of this time was spent counseling patient on results of NCS and med changes  Complete Medication List: 1)  Levoxyl 200 Mcg Tabs (Levothyroxine sodium) .... Take 1 by mouth once daily 2)  Levoxyl 25 Mcg Tabs (Levothyroxine sodium) .... One tablet every other day (in addition the the 200 micrograms tablet) 3)  Humulin N 100 Unit/ml Susp (Insulin isophane human) .Marland Kitchen.. 10 units in the am and 10 units in the  pm 4)  Nitrolingual 0.4 Mg/spray Soln (Nitroglycerin) .... Inhale 1 spray as directed every two hours 5)  Metoprolol Succinate 50 Mg Xr24h-tab (Metoprolol succinate) .... Take 1 by mouth once daily 6)  Aspirin 81 Mg Tbec (Aspirin) .... Take one tablet by mouth daily 7)  Gabapentin 300 Mg Caps (Gabapentin) .... Take 1 by mouth once daily 8)  Alprazolam 0.5 Mg Tabs (Alprazolam) .... Take 1/2-1 tab by mouth two times a day as needed 9)   Vitamin D3 1000 Unit Tabs (Cholecalciferol) .... Take 1 cap daily 10)  Proventil Hfa 108 (90 Base) Mcg/act Aers (Albuterol sulfate) .... Use as needed 11)  Omeprazole 20 Mg Cpdr (Omeprazole) .... Take 1 by mouth qd 12)  Cyanocobalamin 1000 Mcg/ml Soln (Cyanocobalamin) .... Take 1 injection im q month 13)  Bd Luer-lok Syringe 23g X 1-1/4" 3 Ml Misc (Syringe/needle (disp)) .... Use 1 q month to dispense b12 14)  Actos 30 Mg Tabs (Pioglitazone hcl) .... Take 1 tablet by mouth once a day 15)  Benazepril Hcl 20 Mg Tabs (Benazepril hcl) .... Take 1 tablet by mouth once a day 16)  Hydrocodone-acetaminophen 5-500 Mg Tabs (Hydrocodone-acetaminophen) .... Take 1/2 -1 tab every 8 hours as needed for pain 17)  Detrol La 4 Mg Xr24h-cap (Tolterodine tartrate) .... Take 1 cap daily 18)  Amitriptyline Hcl 25 Mg Tabs (Amitriptyline hcl) .Marland Kitchen.. 1 by mouth at bedtime 19)  Furosemide 40 Mg Tabs (Furosemide) .Marland Kitchen.. 1 by mouth daily as needed for swelling 20)  Klor-con M20 20 Meq Cr-tabs (Potassium chloride crys cr) .Marland Kitchen.. 1 by mouth once daily as needed with furosemide for swelling 21)  Nystatin 100000 Unit/gm Powd (Nystatin) .... Apply to affected skin three times a day as needed for yeast 22)  Amlodipine Besylate 5 Mg Tabs (Amlodipine besylate) .... Take one tablet by mouth daily 23)  Phenazopyridine Hcl 200 Mg Tabs (Phenazopyridine hcl) .... Take 1 tablet three times a day as needed for burning on urination  Patient Instructions: 1)  it was good to see you today. 2)  nerve test results reviewed - no need for further tests at this time - 3)  change in metoprolol for blood pressure and change in thyroid medication as discussed and listed below - 4)  Please schedule a follow-up appointment in 3 months, sooner if problems. will recheck DM and cholesterol and thyroid labs at that time

## 2010-07-13 NOTE — Progress Notes (Signed)
Summary: Actos?  Phone Note Call from Patient Call back at Home Phone [phone]   Caller: Patient Summary of Call: Pt called stating she was concerned by recent TV ads saying Actos can cause bladder cancer.  Pt is requesting to have medication changed to Glimperide because Dr Dietrich Pates has prescribed this to her recently. Please advise? Initial call taken by: Margaret Pyle, CMA,  March 01, 2010 1:24 PM  Follow-up for Phone Call        pt and i discussed this at her OV...! however, since pt remains concerned, will stop actos. start glimepiride 2 mg once daily - fax rx done Follow-up by: Newt Lukes MD,  March 01, 2010 2:01 PM  Additional Follow-up for Phone Call Additional follow up Details #1::        Pt informed and understood to start new medication, replacement for Actos. Additional Follow-up by: Margaret Pyle, CMA,  March 01, 2010 2:08 PM    New/Updated Medications: GLIMEPIRIDE 2 MG TABS (GLIMEPIRIDE) 1 by mouth every AM Prescriptions: GLIMEPIRIDE 2 MG TABS (GLIMEPIRIDE) 1 by mouth every AM  #30 x 6   Entered and Authorized by:   Newt Lukes MD   Signed by:   Newt Lukes MD on 03/01/2010   Method used:   [facility]ed to ...       [facility]       [address]       [place], [place]  [address]       Ph: [phone] or [phone]       Fax: [phone]   RxID:   [id number]

## 2010-07-13 NOTE — Assessment & Plan Note (Signed)
Summary: lower abdomenal pain-lb   Vital Signs:  Patient profile:   77 year old female Height:      70 inches Weight:      266 pounds BMI:     38.30 O2 Sat:      94 % on Room air Temp:     97.8 degrees F oral Pulse rate:   77 / minute BP sitting:   136 / 66  (left arm) Cuff size:   large  Vitals Entered By: Margaret Pyle, CMA (February 28, 2010 8:56 AM)  O2 Flow:  Room air CC: Lower Abdominal Pain, urinary frequency, dysuria, Lipid Management   Primary Care Provider:  Newt Lukes MD  CC:  Lower Abdominal Pain, urinary frequency, dysuria, and Lipid Management.  History of Present Illness: here for LLQ pain onset 3 days ago - a/w urinary freq and dysuria diarrhea last week, now constipated +fever last night, chilled today +hx UTI and diverticular dz  also reviewed chronic med issues: 1) HTN - reports compliance with ongoing medical treatment; recent changes in medication reviewed - prev off amlodipine in lotrel due to edema, not taking dilt b/c the opened cap casued her face to feel numb when she smelled it and ?if that med then causes her feet to be numb! pt brought pill bottles and all reviewed today - inc metoprolol since last OV  2) hypothyroid - reports compliance with ongoing medical treatment ;no changes in medication dose or frequency. denies adverse side effects related to current therapy except hard to split pill in half for combined dose = 212.5 mcg  3) DM2 - on insulin and oral meds -  reports compliance with ongoing medical treatment; interval changes in medication reviewed - on actos, on generic amaryl and insulin. denies adverse side effects related to current therapy. no hypoglycemia symptoms or events - checks cbg 2x/d before meals - 90-130 - would still like to stop insulin and take only pills  4) dyslipidemia - reports remaining off medical treatment for last 2 months and feels much improved - feels general weakness has resolved and no further  diffuse muscle aches  Lipid Management History:      Positive NCEP/ATP III risk factors include female age 78 years old or older, diabetes, and hypertension.  Negative NCEP/ATP III risk factors include non-tobacco-user status.    Clinical Review Panels:  Immunizations   Last Tetanus Booster:  Td (08/29/2009)   Last Flu Vaccine:  Fluvax 3+ (02/28/2010)   Last Pneumovax:  Historical (03/11/2009)  Lipid Management   Cholesterol:  144 (12/11/2009)   LDL (bad choesterol):  82 (12/11/2009)   HDL (good cholesterol):  42 (12/11/2009)   Triglycerides:  101 (12/11/2009)  Diabetes Management   HgBA1C:  7.4 (12/11/2009)   Creatinine:  0.92 (12/25/2009)   Last Flu Vaccine:  Fluvax 3+ (02/28/2010)   Last Pneumovax:  Historical (03/11/2009)  CBC   WBC:  7.0 (12/30/2009)   RBC:  4.35 (12/30/2009)   Hgb:  13.7 (12/30/2009)   Hct:  39.7 (12/30/2009)   Platelets:  237.0 (12/30/2009)   MCV  91.3 (12/30/2009)   MCHC  34.5 (12/30/2009)   RDW  13.2 (12/30/2009)   PMN:  62.6 (12/30/2009)   Lymphs:  30.0 (12/30/2009)   Monos:  5.9 (12/30/2009)   Eosinophils:  1.1 (12/30/2009)   Basophil:  0.4 (12/30/2009)  Complete Metabolic Panel   Glucose:  111 (12/25/2009)   Sodium:  135 (12/25/2009)   Potassium:  4.1 (12/25/2009)   Chloride:  103 (12/25/2009)   CO2:  25 (12/25/2009)   BUN:  14 (12/25/2009)   Creatinine:  0.92 (12/25/2009)   Albumin:  4.0 (10/11/2009)   Total Protein:  7.1 (10/11/2009)   Calcium:  10.0 (12/25/2009)   Total Bili:  0.5 (10/11/2009)   Alk Phos:  87 (10/11/2009)   SGPT (ALT):  27 (10/11/2009)   SGOT (AST):  36 (10/11/2009)   Allergies (verified): 1)  ! Penicillin  Past History:  Past Medical History: ASCVD: Cath in 9/99-70% mid LAD with diffuse distal disease, 70% T1, 60% mid circumflex,       50% mid RCA, normal ejection fraction; negative stress nuclear in 07/2008 Cerebrovascular disease-carotid bruits; no focal disease in 1999, 2002 and 2006 Conduction system  disease-dual-chamber pacemaker implant ([employer])-2003 Hx CHF with preserved LV systolic function  Hypertension   Hyperlipidemia Diabetes, type II-insulin-dependent IRRITABLE BOWEL SYNDROME (ICD-564.1)-mixed      Colonoscopy 2008 non-specific colitis, IH, Comfort Diverticulosis, RECTAL ULCER 2o to ASA      Dysphagia-BaSw 2009-nonspecific esophageal motility disorder DUODENAL DIVERTICULUM   GERD w/ HH, esophigitis   COPD OSTEOARTHRITIS - bilateral TKA HYPOTHYROIDISM  IMPINGEMENT SYNDROME, left shoulder  Physician roster: Cardiology: Dietrich Pates; [facility] - manus ([place])  Review of Systems       The patient complains of anorexia and fever.  The patient denies chest pain, peripheral edema, melena, hematochezia, and severe indigestion/heartburn.    Physical Exam  General:  overweight-appearing.  alert, well-developed, well-nourished, and cooperative to examination.  Lungs:  normal respiratory effort, no intercostal retractions or use of accessory muscles; normal breath sounds bilaterally - no crackles and no wheezes.    Heart:  normal rate, regular rhythm, no murmur, and no rub. BLE with trace chronic edema. Abdomen:  soft, mild tender in LLQ but no rebound, normal bowel sounds, no distention; no masses and no appreciable hepatomegaly or splenomegaly.     Impression & Recommendations:  Problem # 1:  ABDOMINAL TENDERNESS, LEFT LOWER QUADRANT (ICD-789.64)  suspect diverticular dz given hx and exam (hx same) Udip neg tx cipro+flagyl - if unimproved s/p abx, consider refer to gyn as pt concerned about ovary problem (s/p remote partial hyst) nontoxic and exam - explianed plan for tx and f/u to pt who agrees  Orders: Prescription Created Electronically [id number])  Problem # 2:  DYSURIA (ICD-788.1)  like related to above - U dip neg but tx for probable GI infx Her updated medication list for this problem includes:    Detrol La 4 Mg Xr24h-cap (Tolterodine tartrate) .Marland Kitchen... Take 1 cap  daily    Ciprofloxacin Hcl 500 Mg Tabs (Ciprofloxacin hcl) .Marland Kitchen... 1 by mouth two times a day x 7 days    Flagyl 500 Mg Tabs (Metronidazole) .Marland Kitchen... 1 by mouth three times a day x 7 days  Encouraged to push clear liquids, get enough rest, and take acetaminophen as needed. To be seen in 10 days if no improvement, sooner if worse.  Orders: Prescription Created Electronically [phone]) UA Dipstick w/o Micro (manual) ([address])  Problem # 3:  HYPERTENSION (ICD-401.9)  Her updated medication list for this problem includes:    Metoprolol Succinate 50 Mg Xr24h-tab (Metoprolol succinate) .Marland Kitchen... Take 1 by mouth once daily    Benazepril Hcl 20 Mg Tabs (Benazepril hcl) .Marland Kitchen... Take 1 tablet by mouth once a day    Furosemide 40 Mg Tabs (Furosemide) .Marland Kitchen... 1 by mouth daily as needed for swelling    Amlodipine Besylate 10 Mg Tabs (Amlodipine besylate) .Marland Kitchen... Take 1 tablet  by mouth once daily  not taking dilt due to ?numbness in face when she smells the med- reduced amlodipine due to edema (which is improved) increase Bbloc and recheck as needed - cont ACEI  BP today: 136/66 Prior BP: 153/73 (02/09/2010)  10 Yr Risk Heart Disease: 17 %  Labs Reviewed: K+: 4.1 (12/25/2009) Creat: : 0.92 (12/25/2009)   Chol: 144 (12/11/2009)   HDL: 42 (12/11/2009)   LDL: 82 (12/11/2009)   TG: 101 (12/11/2009)  Problem # 4:  DIABETES MELLITUS, TYPE II, ON INSULIN (ICD-250.00)  Her updated medication list for this problem includes:    Humulin N 100 Unit/ml Susp (Insulin isophane human) .Marland KitchenMarland KitchenMarland KitchenMarland Kitchen 10 units in the am and 10 units in the  pm    Aspirin 81 Mg Tbec (Aspirin) .Marland Kitchen... Take one tablet by mouth daily    Actos 30 Mg Tabs (Pioglitazone hcl) .Marland Kitchen... Take 1 tablet by mouth once a day    Benazepril Hcl 20 Mg Tabs (Benazepril hcl) .Marland Kitchen... Take 1 tablet by mouth once a day  Labs Reviewed: Creat: 0.92 (12/25/2009)    Reviewed HgBA1c results: 7.4 (12/11/2009)  7.2 (08/29/2009)  Complete Medication List: 1)  Levoxyl 200 Mcg Tabs  (Levothyroxine sodium) .... Take 1 by mouth once daily 2)  Levoxyl 25 Mcg Tabs (Levothyroxine sodium) .... One tablet every other day (in addition the the 200 micrograms tablet) 3)  Humulin N 100 Unit/ml Susp (Insulin isophane human) .Marland Kitchen.. 10 units in the am and 10 units in the  pm 4)  Nitrolingual 0.4 Mg/spray Soln (Nitroglycerin) .... Inhale 1 spray as directed every two hours 5)  Metoprolol Succinate 50 Mg Xr24h-tab (Metoprolol succinate) .... Take 1 by mouth once daily 6)  Aspirin 81 Mg Tbec (Aspirin) .... Take one tablet by mouth daily 7)  Gabapentin 300 Mg Caps (Gabapentin) .... Take 1 by mouth two times a day 8)  Alprazolam 0.5 Mg Tabs (Alprazolam) .... Take 1/2-1 tab by mouth two times a day as needed 9)  Vitamin D3 1000 Unit Tabs (Cholecalciferol) .... Take 1 cap daily 10)  Proventil Hfa 108 (90 Base) Mcg/act Aers (Albuterol sulfate) .... Use as needed 11)  Omeprazole 20 Mg Cpdr (Omeprazole) .... Take 1 by mouth qd 12)  Cyanocobalamin 1000 Mcg/ml Soln (Cyanocobalamin) .... Take 1 injection im q month 13)  Bd Luer-lok Syringe 23g X 1-1/4" 3 Ml Misc (Syringe/needle (disp)) .... Use 1 q month to dispense b12 14)  Actos 30 Mg Tabs (Pioglitazone hcl) .... Take 1 tablet by mouth once a day 15)  Benazepril Hcl 20 Mg Tabs (Benazepril hcl) .... Take 1 tablet by mouth once a day 16)  Hydrocodone-acetaminophen 5-500 Mg Tabs (Hydrocodone-acetaminophen) .... Take 1/2 -1 tab every 8 hours as needed for pain 17)  Detrol La 4 Mg Xr24h-cap (Tolterodine tartrate) .... Take 1 cap daily 18)  Amitriptyline Hcl 25 Mg Tabs (Amitriptyline hcl) .Marland Kitchen.. 1 by mouth at bedtime 19)  Furosemide 40 Mg Tabs (Furosemide) .Marland Kitchen.. 1 by mouth daily as needed for swelling 20)  Klor-con M20 20 Meq Cr-tabs (Potassium chloride crys cr) .Marland Kitchen.. 1 by mouth once daily as needed with furosemide for swelling 21)  Nystatin 100000 Unit/gm Powd (Nystatin) .... Apply to affected skin three times a day as needed for yeast 22)  Phenazopyridine  Hcl 200 Mg Tabs (Phenazopyridine hcl) .... Take 1 tablet three times a day as needed for burning on urination 23)  Vitamin E 400 Unit Caps (Vitamin e) .... Take 1 tab daily 24)  Amlodipine Besylate  10 Mg Tabs (Amlodipine besylate) .... Take 1 tablet by mouth once daily 25)  Ciprofloxacin Hcl 500 Mg Tabs (Ciprofloxacin hcl) .Marland Kitchen.. 1 by mouth two times a day x 7 days 26)  Flagyl 500 Mg Tabs (Metronidazole) .Marland Kitchen.. 1 by mouth three times a day x 7 days  Other Orders: Flu Vaccine 52yrs + MEDICARE PATIENTS (U0454) Administration Flu vaccine - MCR (G0008)  Lipid Assessment/Plan:      Based on NCEP/ATP III, the patient's risk factor category is "history of diabetes".  The patient's lipid goals are as follows: Total cholesterol goal is 200; LDL cholesterol goal is 100; HDL cholesterol goal is 40; Triglyceride goal is 150.     Patient Instructions: 1)  it was good to see you today. 2)  antibiotics as discussed - your prescriptions have been faxted to your pharmacy. Please take as directed. Contact our office if you believe you're having problems with the medication(s).  3)  if continued pain after completeing the antibiotics, will refer to gynecology to check on your ovaries 4)  Get plenty of rest, drink lots of clear liquids, and use Tylenol or Ibuprofen for fever and comfort. Return in 7-10 days if you're not better:sooner if you're feeling worse. Prescriptions: FLAGYL 500 MG TABS (METRONIDAZOLE) 1 by mouth three times a day x 7 days  #21 x 0   Entered and Authorized by:   Newt Lukes MD   Signed by:   Newt Lukes MD on 02/28/2010   Method used:   Printed then faxed to ...       [facility] (retail)       [address]       [place], [place]  [address]       Ph: [phone] or [phone]       Fax: [phone]   RxID:   [id number] CIPROFLOXACIN HCL 500 MG TABS (CIPROFLOXACIN HCL) 1 by mouth two times a day x 7 days  #14 x 0   Entered and  Authorized by:   Newt Lukes MD   Signed by:   Newt Lukes MD on 02/28/2010   Method used:   Printed then faxed to ...       Hospital doctor (retail)       [address]       [place], [place]  [address]       Ph: [phone] or [phone]       Fax: [phone]   RxID:   [id number]  Flu Vaccine Consent Questions     Do you have a history of severe allergic reactions to this vaccine? no    Any prior history of allergic reactions to egg and/or gelatin? no    Do you have a sensitivity to the preservative Thimersol? no    Do you have a past history of Guillan-Barre Syndrome? no    Do you currently have an acute febrile illness? no    Have you ever had a severe reaction to latex? no    Vaccine information given and explained to patient? yes    Are you currently pregnant? no    Lot Number:[id number]   Exp Date:12/09/2010   Site Given  Left Deltoid IM       [address]       [place], [place]  [address]  Ph: [phone] or [phone]       Fax: [phone]   RxID:   [id number]  .[email or web address]  Laboratory Results   Urine Tests    Routine Urinalysis   Color: lt. yellow Appearance: Clear Glucose: negative   (Normal Range: Negative) Bilirubin: negative   (Normal Range: Negative) Ketone: negative   (Normal Range: Negative) Spec. Gravity: 1.010   (Normal Range: 1.003-1.035) Blood: negative   (Normal Range: Negative) pH: 6.0   (Normal Range: 5.0-8.0) Protein: negative   (Normal Range: Negative) Urobilinogen: 0.2   (Normal Range: 0-1) Nitrite: negative   (Normal Range: Negative) Leukocyte Esterace: negative   (Normal Range: Negative)

## 2010-07-13 NOTE — Progress Notes (Signed)
Summary: Rx change  Phone Note Call from Patient Call back at Home Phone [phone]   Caller: Patient Summary of Call: Pt called stating that she would like Rx to help her sleep at night. Pt was taking xanax 0.5mg  2 tab at bedtime instead of two times a day. Pt says that it does not help that well and would prefer to change it altogether. Initial call taken by: Margaret Pyle, CMA,  December 05, 2009 9:48 AM  Follow-up for Phone Call        may try low dose amitriptyline instead - new rx faxed to pharmacy Follow-up by: Newt Lukes MD,  December 05, 2009 10:16 AM  Additional Follow-up for Phone Call Additional follow up Details #1::        Pt informed Additional Follow-up by: Margaret Pyle, CMA,  December 05, 2009 10:19 AM    New/Updated Medications: AMITRIPTYLINE HCL 10 MG TABS (AMITRIPTYLINE HCL) 1 or 2 tab by mouth at bedtime Prescriptions: AMITRIPTYLINE HCL 10 MG TABS (AMITRIPTYLINE HCL) 1 or 2 tab by mouth at bedtime  #60 x 2   Entered and Authorized by:   Newt Lukes MD   Signed by:   Newt Lukes MD on 12/05/2009   Method used:   Faxed to ...       Hospital doctor (retail)       [address]       [place], [place]  [address]       Ph: [phone] or [phone]       Fax: [phone]   RxID:   [id number]

## 2010-07-13 NOTE — Letter (Addendum)
Summary: Medical Information requested by [facility]  ymce clearance   Imported By: Faythe Ghee 06/30/2010 08:21:17  _____________________________________________________________________  External Attachment:    Type:   Image     Comment:   External Document

## 2010-07-13 NOTE — Progress Notes (Signed)
Summary: results  Phone Note Call from Patient Call back at Home Phone [phone]   Summary of Call: Patient left message on triage that her thyroid med has changed and she would like to know why. Also requests last test results. Please advise. Initial call taken by: Lucious Groves,  September 05, 2009 2:06 PM  Follow-up for Phone Call        lucy - please help me recall why.Marland Kitchen i thnk it was a pharmacy issue, not on our side as her labs were normal and no need for medicine or dose change - thanks Follow-up by: Newt Lukes MD,  September 05, 2009 2:11 PM  Additional Follow-up for Phone Call Additional follow up Details #1::        called pt back mix up on her meds. Pharmacy states did not recieved refills on omprazole, gabapentin, meclizine and alprazolam. Sent refill beside alprazolam is it ok to renew? also want to know after she complete antibiotic can she have urine check. very concern about that sister and mother had bladder cancer. pls advise Additional Follow-up by: Orlan Leavens,  September 05, 2009 2:43 PM    Additional Follow-up for Phone Call Additional follow up Details #2::    ok to renew alprazolam as ordered -  will recheck urine at next OV -  thanks Newt Lukes MD  September 05, 2009 3:27 PM   Additional Follow-up for Phone Call Additional follow up Details #3:: Details for Additional Follow-up Action Taken: Notified pt rx sent to pharm. called alprazolam into [facility] spoke with Ste[facility].  Additional Follow-up by: Orlan Leavens,  September 05, 2009 3:40 PM  New/Updated Medications: OMEPRAZOLE 20 MG CPDR (OMEPRAZOLE) take 1 by mouth once daily GABAPENTIN 300 MG CAPS (GABAPENTIN) take 1 by mouth once daily as needed MECLIZINE HCL 25 MG TABS (MECLIZINE HCL) take 1 by mouth once daily as needed for dizziness ALPRAZOLAM 0.5 MG TABS (ALPRAZOLAM) take 1 q am and 2 at bedtime ALPRAZOLAM 0.5 MG TABS (ALPRAZOLAM) take 1 two times a day Prescriptions: ALPRAZOLAM 0.5 MG TABS (ALPRAZOLAM)  take 1 two times a day  #60 x 0   Entered by:   Orlan Leavens   Authorized by:   Newt Lukes MD   Signed by:   Orlan Leavens on 09/05/2009   Method used:   Telephoned to ...       Hospital doctor (retail)       [address]       [place], [place]  [address]       Ph: [phone] or [phone]       Fax: [phone]   RxID:   [id number] ALPRAZOLAM 0.5 MG TABS (ALPRAZOLAM) take 1 q am and 2 at bedtime  #90 x 0   Entered by:   Orlan Leavens   Authorized by:   Newt Lukes MD   Signed by:   Orlan Leavens on 09/05/2009   Method used:   Telephoned to ...       Hospital doctor (retail)       [address]       [place], [place]  [address]       Ph: [phone] or [phone]       Fax: [phone]   RxID:   [id number] MECLIZINE HCL 25 MG TABS (MECLIZINE HCL) take 1 by mouth once daily as needed  for dizziness  #30 x 3   Entered by:   Orlan Leavens   Authorized by:   Newt Lukes MD   Signed by:   Orlan Leavens on 09/05/2009   Method used:   Faxed to ...       Hospital doctor (retail)       [address]       [place], [place]  [address]       Ph: [phone] or [phone]       Fax: [phone]   RxID:   [id number] GABAPENTIN 300 MG CAPS (GABAPENTIN) take 1 by mouth once daily as needed  #30 x 5   Entered by:   Orlan Leavens   Authorized by:   Newt Lukes MD   Signed by:   Orlan Leavens on 09/05/2009   Method used:   Faxed to ...       Hospital doctor (retail)       [address]       [place], [place]  [address]       Ph: [phone] or [phone]       Fax: [phone]   RxID:   [id number] OMEPRAZOLE 20 MG CPDR (OMEPRAZOLE) take 1 by mouth once daily  #30 x 11   Entered by:   Orlan Leavens   Authorized by:   Newt Lukes MD   Signed by:   Orlan Leavens on 09/05/2009   Method used:   Faxed to ...       [facility]       [address]       [place], [place]  [address]       Ph: [phone] or [phone]       Fax: [phone]   RxID:   [id number]

## 2010-07-13 NOTE — Letter (Signed)
Summary: [facility]  [facility]   Imported By: Sherian Rein 03/21/2010 10:58:13  _____________________________________________________________________  External Attachment:    Type:   Image     Comment:   External Document

## 2010-07-13 NOTE — Assessment & Plan Note (Signed)
Summary: 6 wk f/u per checkout on 12/07/09/tg   Visit Type:  Follow-up Primary Provider:  Newt Lukes MD   History of Present Illness: [name] [name] returns today for followup.  She is a pleasant 77 yo woman with longstanding DM, symptomatic bradycardia  s/p PPM, HTN, and dyslipidemia.  She had c/o problems with leg pain and was found to have neurapathy.  This has improved with Gabapentin.  She denies c/p or sob. She notes that her peripheral edema improves when she does not drink Mountain Dew soda and eat as many Bugles.  Current Medications (verified): 1)  Levoxyl 200 Mcg Tabs (Levothyroxine Sodium) .... Take 1 By Mouth Once Daily 2)  Levoxyl 25 Mcg Tabs (Levothyroxine Sodium) .... One Tablet Every Other Day (In Addition The The 200 Micrograms Tablet) 3)  Humulin N 100 Unit/ml Susp (Insulin Isophane Human) .Marland Kitchen.. 10 Units in The Am and 10 Units in The  Pm 4)  Nitrolingual 0.4 Mg/spray Soln (Nitroglycerin) .... Inhale 1 Spray As Directed Every Two Hours 5)  Metoprolol Succinate 50 Mg Xr24h-Tab (Metoprolol Succinate) .... Take 1 By Mouth Once Daily 6)  Aspirin 81 Mg Tbec (Aspirin) .... Take One Tablet By Mouth Daily 7)  Gabapentin 300 Mg Caps (Gabapentin) .... Take 1 By Mouth Once Daily 8)  Alprazolam 0.5 Mg Tabs (Alprazolam) .... Take 1/2-1 Tab By Mouth Two Times A Day As Needed 9)  Vitamin D3 1000 Unit Tabs (Cholecalciferol) .... Take 1 Cap Daily 10)  Proventil Hfa 108 (90 Base) Mcg/act Aers (Albuterol Sulfate) .... Use As Needed 11)  Omeprazole 20 Mg Cpdr (Omeprazole) .... Take 1 By Mouth Qd 12)  Cyanocobalamin 1000 Mcg/ml Soln (Cyanocobalamin) .... Take 1 Injection Im Q Month 13)  Bd Luer-Lok Syringe 23g X 1-1/4" 3 Ml Misc (Syringe/needle (Disp)) .... Use 1 Q Month To Dispense B12 14)  Actos 30 Mg Tabs (Pioglitazone Hcl) .... Take 1 Tablet By Mouth Once A Day 15)  Benazepril Hcl 20 Mg Tabs (Benazepril Hcl) .... Take 1 Tablet By Mouth Once A Day 16)  Hydrocodone-Acetaminophen 5-500 Mg  Tabs (Hydrocodone-Acetaminophen) .... Take 1/2 -1 Tab Every 8 Hours As Needed For Pain 17)  Detrol La 4 Mg Xr24h-Cap (Tolterodine Tartrate) .... Take 1 Cap Daily 18)  Amitriptyline Hcl 25 Mg Tabs (Amitriptyline Hcl) .Marland Kitchen.. 1 By Mouth At Bedtime 19)  Furosemide 40 Mg Tabs (Furosemide) .Marland Kitchen.. 1 By Mouth Daily As Needed For Swelling 20)  Klor-Con M20 20 Meq Cr-Tabs (Potassium Chloride Crys Cr) .Marland Kitchen.. 1 By Mouth Once Daily As Needed With Furosemide For Swelling 21)  Nystatin 100000 Unit/gm Powd (Nystatin) .... Apply To Affected Skin Three Times A Day As Needed For Yeast 22)  Phenazopyridine Hcl 200 Mg Tabs (Phenazopyridine Hcl) .... Take 1 Tablet Three Times A Day As Needed For Burning On Urination 23)  Vitamin E 400 Unit Caps (Vitamin E) .... Take 1 Tab Daily 24)  Amlodipine Besylate 10 Mg Tabs (Amlodipine Besylate) .... Take 1 Tablet By Mouth Once Daily  Allergies (verified): 1)  ! Penicillin  Past History:  Past Medical History: Last updated: 01/16/2010 ASCVD: Cath in 9/99-70% mid LAD with diffuse distal disease, 70% T1, 60% mid circumflex,       50% mid RCA, normal ejection fraction; negative stress nuclear in 07/2008 Cerebrovascular disease-carotid bruits; no focal disease in 1999, 2002 and 2006 Conduction system disease-dual-chamber pacemaker implant (Medtronic)-2003 Hx CHF with preserved LV systolic function  Hypertension  Hyperlipidemia Diabetes, type II-insulin-dependent IRRITABLE BOWEL SYNDROME (ICD-564.1)-mixed  Colonoscopy 2008 non-specific colitis, IH, [place] Diverticulosis, RECTAL ULCER 2o to ASA      Dysphagia-BaSw 2009-nonspecific esophageal motility disorder DUODENAL DIVERTICULUM GERD w/ HH, esophigitis   COPD OSTEOARTHRITIS - bilateral TKA HYPOTHYROIDISM  IMPINGEMENT SYNDROME, left shoulder  Physician roster: Cardiology: Dietrich Pates; [facility] - manus Sidney Ace)  Past Surgical History: Last updated: 09/15/2009 Dual-chamber pacemaker implant in 2003  (Medtronic) Bilateral TKA NASAL SURGERY CHOLECYSTECTOMY HEMORRHOIDECTOMY HYSTERECTOMY CARPAL TUNNEL RELEASE Thyroidectomy in 1980 for goiter  Review of Systems       The patient complains of peripheral edema.  The patient denies chest pain, syncope, and dyspnea on exertion.    Vital Signs:  Patient profile:   77 year old female Weight:      268 pounds BMI:     38.59 Pulse rate:   88 / minute BP sitting:   153 / 73  (right arm)  Vitals Entered By: [name] Saa, CNA (February 09, 2010 9:53 AM)  Physical Exam  General:  overweight-appearing.  alert, well-developed, well-nourished, and cooperative to examination. spouse at side Head:  Normocephalic and atraumatic. Eyes:  vision grossly intact; pupils equal, round and reactive to light.  conjunctiva and lids normal.    Neck:  Neck supple, no JVD. No masses, thyromegaly or abnormal cervical nodes. Chest Wall:  Well healed PPM incision. Lungs:  normal respiratory effort, no intercostal retractions or use of accessory muscles; normal breath sounds bilaterally - no crackles and no wheezes.    Heart:  normal rate, regular rhythm, no murmur, and no rub. BLE with trace chronic edema. Abdomen:  soft, non-tender, normal bowel sounds, no distention; no masses and no appreciable hepatomegaly or splenomegaly.   Msk:  Back normal, normal gait. Muscle strength and tone normal. Pulses:  Normal pulses noted. Extremities:  No clubbing or cyanosis. Neurologic:  Alert and oriented x 3.   PPM Specifications Following MD:  Lewayne Bunting, MD     PPM Vendor:  Medtronic     PPM Model Number:  [id number]     PPM Serial Number:  [id number] PPM DOI:  12/24/2001     PPM Implanting MD:  Lewayne Bunting, MD  Lead 1    Location: RA     DOI: 12/24/2001     Model #: [id number]     Serial #: [id number]     Status: active Lead 2    Location: RV     DOI: 12/24/2001     Model #: [id number]     Serial #: [id number]     Status: active  Magnet Response Rate:  BOL 85 ERI  65  Indications:  Pre-syncope; 2nd AVB   PPM Follow Up Remote Check?  No Battery Voltage:  2.69 V     Battery Est. Longevity:  18 months     Pacer Dependent:  Yes       PPM Device Measurements Atrium  Amplitude: 4.0 mV, Impedance: 456 ohms, Threshold: 0.5 V at 0.4 msec Right Ventricle  Impedance: 644 ohms, Threshold: 0.75 V at 0.4 msec  Episodes MS Episodes:  0     Percent Mode Switch:  0     Coumadin:  No Ventricular High Rate:  0     Atrial Pacing:  86.4     Ventricular Pacing:  99.9  Parameters Mode:  DDDR     Lower Rate Limit:  60     Upper Rate Limit:  130 Paced AV Delay:  250     Sensed AV Delay:  250 Tech  Comments:  No parameter changes.  Device function normal.  Battery nearing ERI.  ROV 6 months RDS clinic. Altha Harm, LPN  February 09, 2010 10:15 AM n MD Comments:  Agree with above.  Impression & Recommendations:  Problem # 1:  CARDIAC PACEMAKER IN SITU (ICD-V45.01) Her device is working normally.  Will recheck in several months.  Problem # 2:  CHRONIC DIASTOLIC HEART FAILURE (ICD-428.32) Her symptoms appear to be well controlled. She will continue her meds as below.  I have asked her to stop cardizem and restart amlodipine. The following medications were removed from the medication list:    Amlodipine Besylate 5 Mg Tabs (Amlodipine besylate) .Marland Kitchen... Take one tablet by mouth daily    Diltiazem Hcl Er Beads 240 Mg Xr24h-cap (Diltiazem hcl er beads) .Marland Kitchen... 1 tab by mouth once daily Her updated medication list for this problem includes:    Nitrolingual 0.4 Mg/spray Soln (Nitroglycerin) ..... Inhale 1 spray as directed every two hours    Metoprolol Succinate 50 Mg Xr24h-tab (Metoprolol succinate) .Marland Kitchen... Take 1 by mouth once daily    Aspirin 81 Mg Tbec (Aspirin) .Marland Kitchen... Take one tablet by mouth daily    Benazepril Hcl 20 Mg Tabs (Benazepril hcl) .Marland Kitchen... Take 1 tablet by mouth once a day    Furosemide 40 Mg Tabs (Furosemide) .Marland Kitchen... 1 by mouth daily as needed for swelling     Amlodipine Besylate 10 Mg Tabs (Amlodipine besylate) .Marland Kitchen... Take 1 tablet by mouth once daily  Problem # 3:  ATHEROSCLEROTIC CARDIOVASCULAR DISEASE (ICD-429.2) She denies anginal symptoms.  I have asked her to stop eating her high fat, salty foods.  Patient Instructions: 1)  Your physician recommends that you schedule a follow-up appointment in: 6 months with Gunnar Fusi and 1 year with Dr. Ladona Ridgel 2)  Your physician has recommended you make the following change in your medication: After you finish current bottle of Amlodipine/Benazepril 5/20mg  po qd start taking Amlodipine 10mg  by mouth once daily and Benazepril 20mg  by mouth once daily  Prescriptions: AMLODIPINE BESYLATE 10 MG TABS (AMLODIPINE BESYLATE) take 1 tablet by mouth once daily  #30 x 11   Entered by:   Larita Fife Via LPN   Authorized by:   Laren Boom, MD, [facility]   Signed by:   Larita Fife Via LPN on 78/29/5621   Method used:   Faxed to ...       [facility]ospital doctor (retail)       [address]       [place], [place]  [address]       Ph: [phone] or [phone]       Fax: [phone]   RxID:   [id number]

## 2010-07-13 NOTE — Medication Information (Signed)
Summary: Diabetes Supplies/[employer]  Diabetes Supplies/[employer]   Imported By: Sherian Rein 03/08/2010 08:59:15  _____________________________________________________________________  External Attachment:    Type:   Image     Comment:   External Document

## 2010-07-13 NOTE — Progress Notes (Signed)
  Phone Note Call from Patient   Caller: Patient Call For: diltiazem side effects Summary of Call: Pt switched to diltiazem for cost , since that time she has had s/s dizziness and numbness when she takes the medication. 2 weekend trips to the ED: 7/2 for weakness and 7/10 for numbness. Pt is requesting to go back on lotrel 5/20 daily  Initial call taken by: Teressa Lower RN,  December 19, 2009 11:00 AM  Follow-up for Phone Call        I doubt drug is causing symptoms, but she can resume previous RX.  For cost savings, she should be given Rx for benazerpril 20 mg once daily and a second Rx for amlodipine 5 mg once daily.   Aldrich Bing, M.D.   Follow-up by: Kathlen Brunswick, MD, [facility],  December 21, 2009 3:17 PM  Additional Follow-up for Phone Call Additional follow up Details #1::        Unable to reach pt, sent a detailed letter about stopping diltazem and restarting lotrel combo  benazepril and amlodipine 20/5 Additional Follow-up by: Teressa Lower RN,  December 27, 2009 4:01 PM    New/Updated Medications: BENAZEPRIL HCL 20 MG TABS (BENAZEPRIL HCL) Take 1 tablet by mouth once a day AMLODIPINE BESYLATE 5 MG TABS (AMLODIPINE BESYLATE) Take one tablet by mouth daily Prescriptions: BENAZEPRIL HCL 20 MG TABS (BENAZEPRIL HCL) Take 1 tablet by mouth once a day  #30 x 6   Entered by:   Teressa Lower RN   Authorized by:   Kathlen Brunswick, MD, [facility]   Signed by:   Teressa Lower RN on 12/27/2009   Method used:   Faxed to ...       Hospital doctor (retail)       [address]       [place], [place]  [address]       Ph: [phone] or [phone]       Fax: [phone]   RxID:   [id number] AMLODIPINE BESYLATE 5 MG TABS (AMLODIPINE BESYLATE) Take one tablet by mouth daily  #30 x 6   Entered by:   Teressa Lower RN   Authorized by:   Kathlen Brunswick, MD, [facility]   Signed by:   Teressa Lower RN on 12/27/2009   Method used:   Faxed to ...       [facility]retail[facility]       [address]       [place], [place]  [address]       Ph: [phone] or [phone]       Fax: [phone]   RxID:   [id number]

## 2010-07-13 NOTE — Progress Notes (Signed)
Summary: DM med change?  Phone Note Call from Patient Call back at Home Phone [phone]   Caller: Patient Summary of Call: pt called stating that her DM medication was changed from Actos to Amaryl by Dr. Dietrich Pates. Pt says the she wants to switch back and was told by Dr. Marvel Plan office to either try medication for a little longer or schedule a ROV with VAL to discuss. I advised pt of same and she decised to try medications and call back later if she still wants to switch. Initial call taken by: Margaret Pyle, CMA,  Oct 10, 2009 11:07 AM

## 2010-07-13 NOTE — Assessment & Plan Note (Signed)
Summary: E4V   Visit Type:  Follow-up Primary Provider:  Newt Lukes MD  CC:  no cardiology complaints.  History of Present Illness: [name] [name] returns today for followup.  She is a pleasant 77 yo woman with longstanding DM, symptomatic bradycardian s/p PPM, HTN, and dyslipidemia.  The patient has c/o weakness especially in the legs over the past 3 weeks.  She seems to relate this to increase in her cholesterol medication.  She denies c/p or sob. No peripheral edema.  Current Medications (verified): 1)  Levoxyl 200 Mcg Tabs (Levothyroxine Sodium) .... Take 1 By Mouth Once Daily 2)  Levoxyl 25 Mcg Tabs (Levothyroxine Sodium) .... Take One Half Tablet in Addition The The 200 Micrograms Tablet 3)  Humulin N 100 Unit/ml Susp (Insulin Isophane Human) .Marland Kitchen.. 10 Units in The Am and 10 Units in The  Pm 4)  Nitrolingual 0.4 Mg/spray Soln (Nitroglycerin) .... Inhale 1 Spray As Directed Every Two Hours 5)  Metoprolol Succinate 50 Mg Xr24h-Tab (Metoprolol Succinate) .... Take 1/2 By Mouth Qd 6)  Diltiazem Hcl Er Beads 240 Mg Xr24h-Cap (Diltiazem Hcl Er Beads) .... Take One Capsule By Mouth Daily 7)  Aspirin 81 Mg Tbec (Aspirin) .... Take One Tablet By Mouth Daily 8)  Vitamin E 1000 Unit Caps (Vitamin E) .... Take 1 Tablet By Mouth Once A Day 9)  Furosemide 40 Mg Tabs (Furosemide) .Marland Kitchen.. 1 By Mouth Daily 10)  Gabapentin 300 Mg Caps (Gabapentin) .... Take 1 By Mouth Once Daily As Needed 11)  Alprazolam 0.5 Mg Tabs (Alprazolam) .... Take 1 Two Times A Day As Needed - To Fill June 2 , 2011 12)  Vitamin D3 1000 Unit Tabs (Cholecalciferol) .... Take 1 Cap Daily 13)  Proventil Hfa 108 (90 Base) Mcg/act Aers (Albuterol Sulfate) .... Use As Needed 14)  Omeprazole 20 Mg Cpdr (Omeprazole) .... Take 1 By Mouth Qd 15)  Cyanocobalamin 1000 Mcg/ml Soln (Cyanocobalamin) .... Take 1 Injection Im Q Month 16)  Bd Luer-Lok Syringe 23g X 1-1/4" 3 Ml Misc (Syringe/needle (Disp)) .... Use 1 Q Month To Dispense B12 17)   Actos 30 Mg Tabs (Pioglitazone Hcl) .... Take 1 Tablet By Mouth Once A Day 18)  Benazepril Hcl 20 Mg Tabs (Benazepril Hcl) .... Take 1 Tab Daily 19)  Hydrocodone-Acetaminophen 5-500 Mg Tabs (Hydrocodone-Acetaminophen) .... Take 1 Q 6 Hours As Needed 20)  Detrol La 4 Mg Xr24h-Cap (Tolterodine Tartrate) .... Take 1 Cap Daily 21)  Amitriptyline Hcl 10 Mg Tabs (Amitriptyline Hcl) .Marland Kitchen.. 1 or 2 Tab By Mouth At Bedtime  Allergies (verified): 1)  ! Penicillin  Past History:  Past Medical History: Last updated: 11/24/2009 ASCVD: Cath in 9/99-70% mid LAD with diffuse distal disease, 70% T1, 60% mid circumflex,       50% mid RCA, normal ejection fraction; negative stress nuclear in 07/2008 Cerebrovascular disease-carotid bruits; no focal disease in 1999, 2002 and 2006 Conduction system disease-dual-chamber pacemaker implant (Medtronic)-2003 History of congestive heart failure with preserved LV systolic function Hypertension Hyperlipidemia   Diabetes, type II-insulin-dependent IRRITABLE BOWEL SYNDROME (ICD-564.1)-mixed      Colonoscopy 2008 non-specific colitis, IH, [place] Diverticulosis, RECTAL ULCER 2o to ASA      Dysphagia-BaSw 2009-nonspecific esophageal motility disorder DUODENAL DIVERTICULUM GERD w/ HH, esophigitis   COPD OSTEOARTHRITIS - bilateral TKA HYPOTHYROIDISM  IMPINGEMENT SYNDROME, left shoulder  Physician roster: Cardiology: Dietrich Pates; [facility] -   Past Surgical History: Last updated: 09/15/2009 Dual-chamber pacemaker implant in 2003 (Medtronic) Bilateral TKA NASAL SURGERY CHOLECYSTECTOMY HEMORRHOIDECTOMY HYSTERECTOMY CARPAL  TUNNEL RELEASE Thyroidectomy in 1980 for goiter  Review of Systems  The patient denies chest pain, syncope, dyspnea on exertion, peripheral edema, and prolonged cough.    Vital Signs:  Patient profile:   77 year old female Weight:      265 pounds Pulse rate:   83 / minute BP sitting:   170 / 85  (right arm)  Vitals Entered By: Dreama Saa, CNA (December 07, 2009 9:55 AM)  Physical Exam  General:  overweight-appearing.  alert, well-developed, well-nourished, and cooperative to examination.    Head:  Normocephalic and atraumatic. Eyes:  vision grossly intact; pupils equal, round and reactive to light.  conjunctiva and lids normal.    Neck:  Neck supple, no JVD. No masses, thyromegaly or abnormal cervical nodes. Chest Wall:  Well healed PPM incision. Lungs:  normal respiratory effort, no intercostal retractions or use of accessory muscles; normal breath sounds bilaterally - no crackles and no wheezes.    Heart:  normal rate, regular rhythm, no murmur, and no rub. BLE with 2+ chronic edema. normal DP pulses and normal cap refill in all 4 extremities    Abdomen:  obese, soft, non-tender, normal bowel sounds, no distention; no masses and no appreciable hepatomegaly or splenomegaly.   Pulses:  Normal pulses noted. Extremities:  No clubbing or cyanosis. Neurologic:  Alert and oriented x 3. Grossly non-focal.   PPM Specifications Following MD:  Lewayne Bunting, MD     PPM Vendor:  [employer]     PPM Model Number:  [id number]     PPM Serial Number:  [id number] PPM DOI:  12/24/2001     PPM Implanting MD:  Lewayne Bunting, MD  Lead 1    Location: RA     DOI: 12/24/2001     Model #: [id number]     Serial #: [id number]     Status: active Lead 2    Location: RV     DOI: 12/24/2001     Model #: [id number]     Serial #: [id number]     Status: active  Magnet Response Rate:  BOL 85 ERI 65  Indications:  Pre-syncope; 2nd AVB   PPM Follow Up Battery Voltage:  2.71 V     Battery Est. Longevity:      Pacer Dependent:  Yes       PPM Device Measurements Atrium  Amplitude: 5.60 mV, Impedance: 455 ohms, Threshold: 0.50 V at 0.40 msec Right Ventricle  Amplitude: PACED mV, Impedance: 628 ohms, Threshold: 0.50 V at 0.40 msec  Episodes MS Episodes:  0     Percent Mode Switch:  0     Coumadin:  No Ventricular High Rate:  0     Atrial Pacing:  83.6%      Ventricular Pacing:  99.9%  Parameters Mode:  DDDR     Lower Rate Limit:  60     Upper Rate Limit:  130 Paced AV Delay:  250     Sensed AV Delay:  250 Next Cardiology Appt Due:  05/11/2010 Tech Comments:  PT COMPLAINING OF FATIGUE AND SOB--CHANGED ACTIVITY THRESHOLD FROM MEDIUM/LOW TO LOW AND ADL & EXERTION RESPONSE TO 4.  NORMAL DEVICE FUNCTION.  CHANGED RV OUTPUT FROM 2.00 TO 2.50 V.  ROV IN 6 M[facility] CLINIC RDS. Vella Kohler  December 07, 2009 10:41 AM MD Comments:  Agree with above.  I am not sure her weakness is related to chronotropic incompetence but we have increased her rate response nevertheless.  Impression &  Recommendations:  Problem # 1:  FATIGUE (ICD-780.79) Her complaints of fatigue and weakness are not new but seemed to have worsened after increasing her medication for cholesterol lowering.  I have asked her to stop all cholesterol medication and will check a cpk and a tsh.  Problem # 2:  MIXED HYPERLIPIDEMIA (ICD-272.2)  Her updated medication list for this problem includes:    Simvastatin 40 Mg Tabs (Simvastatin) .Marland Kitchen... 1 tab by mouth at bedtime  The following medications were removed from the medication list:    Simvastatin 40 Mg Tabs (Simvastatin) .Marland Kitchen... 1 tab by mouth at bedtime  Problem # 3:  CARDIAC PACEMAKER IN SITU (ICD-V45.01) Her device is working normally with a change made today in her rate response.  Other Orders: T- * Misc. Laboratory test [phone]) T-TSH [phone])  Patient Instructions: 1)  Your physician recommends that you schedule a follow-up appointment in: 6 weeks 2)  Your physician recommends that you return for lab work in: today 3)  Your physician has recommended you make the following change in your medication: Stop taking Simvastatin

## 2010-07-13 NOTE — Progress Notes (Signed)
Summary: Medication Questions  Phone Note Call from Patient   Caller: Patient Reason for Call: Talk to Nurse Summary of Call: patient would like to speak to nurse regarding medication she was given for arthritis by another physician / tg Initial call taken by: Raechel Ache [facility],  June 21, 2010 3:21 PM  Follow-up for Phone Call        [facility] Follow-up by: Teressa Lower RN,  June 22, 2010 12:02 PM  Additional Follow-up for Phone Call Additional follow up Details #1::        pt was concerned about taking meloxicam with her heart disease, is now taking 15mg  two times a day I am unable to change any meds in the note at this time Additional Follow-up by: Teressa Lower RN,  June 22, 2010 4:20 PM

## 2010-07-13 NOTE — Progress Notes (Signed)
Summary: alprazolam  Phone Note Refill Request Message from:  Fax from Pharmacy on Oct 11, 2009 3:20 PM  Refills Requested: Medication #1:  ALPRAZOLAM 0.5 MG TABS take 1 two times a day # 60   Last Refilled: 09/06/2009 Initial call taken by: Orlan Leavens,  Oct 11, 2009 3:20 PM  Follow-up for Phone Call        ok to fill as requested -  also let pt know i made referral to eye doctor as requested- thanks- Follow-up by: Newt Lukes MD,  Oct 11, 2009 3:34 PM  Additional Follow-up for Phone Call Additional follow up Details #1::        Notified [facility] spoke with Janet/pharmacist ok # 60 only. EMR updated Additional Follow-up by: Orlan Leavens,  Oct 11, 2009 4:07 PM    Prescriptions: ALPRAZOLAM 0.5 MG TABS (ALPRAZOLAM) take 1 two times a day  #60 x 0   Entered by:   Orlan Leavens   Authorized by:   Newt Lukes MD   Signed by:   Orlan Leavens on 10/11/2009   Method used:   Telephoned to ...       Hospital doctor (retail)       [address]       [place], [place]  [address]       Ph: [phone] or [phone]       Fax: [phone]   RxID:   [id number]

## 2010-07-13 NOTE — Progress Notes (Signed)
Summary: PA-Detrol  Phone Note From Pharmacy   Summary of Call: PA-Detrol not covered by insurance must try Ditropan, Enablex, Vesicare or Oxytrol.  [name] [name]  April 25, 2010 4:41 PM        Follow-up for Phone Call        change detrol to vesicare (listed historically - may send or fac to pharm as needed) - thanks Follow-up by: Newt Lukes MD,  April 25, 2010 5:09 PM  Additional Follow-up for Phone Call Additional follow up Details #1::        Pt advised in detail and expressed understanding Additional Follow-up by: Margaret Pyle, CMA,  April 26, 2010 8:41 AM    New/Updated Medications: VESICARE 5 MG TABS (SOLIFENACIN SUCCINATE) 1 by mouth once daily Prescriptions: VESICARE 5 MG TABS (SOLIFENACIN SUCCINATE) 1 by mouth once daily  #30 x 5   Entered by:   Margaret Pyle, CMA   Authorized by:   Newt Lukes MD   Signed by:   Margaret Pyle, CMA on 04/26/2010   Method used:   Faxed to ...       Hospital doctor (retail)       [address]       [place], [place]  [address]       Ph: [phone] or [phone]       Fax: [phone]   RxID:   [id number]

## 2010-07-13 NOTE — Progress Notes (Signed)
Summary: ?resume statin  Phone Note From Other Clinic   Caller: Darlys Gales, [place], [phone] Summary of Call: Caller requesting if pt. was going back on Simvastatin? Initial call taken by: Scharlene Gloss,  November 24, 2009 4:36 PM  Follow-up for Phone Call        yes, it is ok for pt to be on simvastatin 40mg  at bedtime - (prev was on crestor but cards - dr. Dietrich Pates advised simva for cost savings & i agree with this) - i listed update in EMR med list - may call or fax new rx if needed Follow-up by: Newt Lukes MD,  November 24, 2009 4:43 PM  Additional Follow-up for Phone Call Additional follow up Details #1::        Pt and [facility] aware and Rx sent to pharmacy per pt request Additional Follow-up by: Margaret Pyle, CMA,  November 25, 2009 8:19 AM    New/Updated Medications: SIMVASTATIN 40 MG TABS (SIMVASTATIN) 1 tab by mouth at bedtime Prescriptions: SIMVASTATIN 40 MG TABS (SIMVASTATIN) 1 tab by mouth at bedtime  #30 x 5   Entered by:   Margaret Pyle, CMA   Authorized by:   Newt Lukes MD   Signed by:   Margaret Pyle, CMA on 11/25/2009   Method used:   Faxed to ...       [facility]ospital [facility] (retail[facility]       [address]       [place], [place]  [address]       Ph: [phone] or [phone]       Fax: [phone]   RxID:   [id number]

## 2010-07-13 NOTE — Progress Notes (Signed)
Summary: Rx change  Phone Note Call from Patient Call back at Home Phone [phone]   Caller: Patient Summary of Call: Pt called stating that her Insurance co will not cover Alprazolam. Pt is requesting Rx change, please advise Initial call taken by: Margaret Pyle, CMA,  October 03, 2009 9:06 AM  Follow-up for Phone Call        there is no substitute of generic xanax that is covered by insurance that i am aware of - if pt has the name of a medicine that she knows her insurance will cover, let me know that and i will then consider change if approp - thanks Follow-up by: Newt Lukes MD,  October 03, 2009 10:42 AM  Additional Follow-up for Phone Call Additional follow up Details #1::        pharmacy states that [facility] does not cover any Benzos. Pt advise that she would need to purchase medication out-of-pocket  Additional Follow-up by: Margaret Pyle, CMA,  October 03, 2009 4:20 PM

## 2010-07-13 NOTE — Assessment & Plan Note (Signed)
Summary: ROV   Visit Type:  Follow-up Primary Provider:  Newt Lukes MD   History of Present Illness: [name] returns to the office after a 14 month hiatus, having been lost to followup, at least as far as my practice is considered.  She has transferred management of her primary care to Dr. Felicity Coyer and has continued to see Dr. Ladona Ridgel for assessment and adjustment of her pacemaker.  Despite known moderate three-vessel coronary disease as of 1999, she is doing very well from a cardiovascular standpoint.  She reports no chest discomfort, but does have chronic class III dyspnea on exertion.  This has worsened over the winter as the result of decreased physical activity.  She denies orthopnea or PND.  She has chronic pedal edema.  She has not required hospitalization or urgent medical care over the past 12 months.  Current Medications (verified): 1)  Levoxyl 200 Mcg Tabs (Levothyroxine Sodium) .... Take 1 By Mouth Once Daily 2)  Levoxyl 25 Mcg Tabs (Levothyroxine Sodium) .... Take One Half Tablet in Addition The The 200 Micrograms Tablet 3)  Humulin N 100 Unit/ml Susp (Insulin Isophane Human) .Marland Kitchen.. 10 Units in The Am and 10 Units in The  Pm 4)  Nitrolingual 0.4 Mg/spray Soln (Nitroglycerin) .... Inhale 1 Spray As Directed Every Two Hours 5)  Metoprolol Succinate 50 Mg Xr24h-Tab (Metoprolol Succinate) .... Take 1/2 By Mouth Qd 6)  Crestor 10 Mg Tabs (Rosuvastatin Calcium) .... One By Mouth Qhs 7)  Diltiazem Hcl Er Beads 240 Mg Xr24h-Cap (Diltiazem Hcl Er Beads) .... Take One Capsule By Mouth Daily 8)  Aspirin 81 Mg Tbec (Aspirin) .... Take One Tablet By Mouth Daily 9)  Vitamin E 1000 Unit Caps (Vitamin E) .... Take 1 Tablet By Mouth Once A Day 10)  Aciphex 20 Mg Tbec (Rabeprazole Sodium) .... Take 1 By Mouth Qd 11)  Furosemide 40 Mg Tabs (Furosemide) .Marland Kitchen.. 1 By Mouth  Every Morning and 1 By Mouth At Lunch 12)  Gabapentin 300 Mg Caps (Gabapentin) .... Take 1 By Mouth Once Daily As  Needed 13)  Meclizine Hcl 25 Mg Tabs (Meclizine Hcl) .... Take 1 By Mouth Once Daily As Needed For Dizziness 14)  Alprazolam 0.5 Mg Tabs (Alprazolam) .... Take 1 Two Times A Day 15)  Vitamin D3 1000 Unit Tabs (Cholecalciferol) .... Take 1 Cap Daily 16)  Detrol La 4 Mg Xr24h-Cap (Tolterodine Tartrate) .... Take 1 Tab Daily 17)  Lortab 5-500 Mg Tabs (Hydrocodone-Acetaminophen) .... Take As Needed 18)  Proventil Hfa 108 (90 Base) Mcg/act Aers (Albuterol Sulfate) .... Use As Needed 19)  Lotensin 20 Mg Tabs (Benazepril Hcl) .... Take 1 Tablet By Mouth Once A Day 20)  Amaryl 2 Mg Tabs (Glimepiride) .... Take 1 Tablet By Mouth Once A Day 21)  Aspirin 81 Mg Tbec (Aspirin) .... Take One Tablet By Mouth Daily  Allergies (verified): 1)  ! Penicillin  Past History:  PMH, FH, and Social History reviewed and updated.  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 History of congestive heart failure with preserved LV systolic function Hypertension Hyperlipidemia Diabetes, type II-insulin-dependent IRRITABLE BOWEL SYNDROME (ICD-564.1)-mixed      Colonoscopy 2008 non-specific colitis, IH, [place] Diverticulosis, RECTAL ULCER 2o to ASA  Dysphagia-BaSw 2009-nonspecific esophageal motility disorder DUODENAL DIVERTICULUM GERD w/ HH, esophigitis COPD OSTEOARTHRITIS - bilateral TKA HYPOTHYROIDISM  IMPINGEMENT SYNDROME, left shoulder   Physicians Cardiology: General-Dr. Dietrich Pates; EP-Dr. Ladona Ridgel GI -   Past Surgical History: Dual-chamber pacemaker implant in 2003 (Medtronic) Bilateral TKA NASAL SURGERY CHOLECYSTECTOMY HEMORRHOIDECTOMY HYSTERECTOMY CARPAL TUNNEL RELEASE Thyroidectomy in 1980 for  goiter  Clinical Review Panels:  Lipid Levels LDL 83 (08/29/2009) HDL 50.40 (08/29/2009) Cholesterol 170 (08/29/2009)   Echocardiogram  Procedure date:  11/27/2001  Findings:      No valvular abnormalities Borderline LVH with normal LV systolic function  Carotid Doppler  Procedure date:  04/09/2001  Findings:      Tortuous vessels with calcific plaque in both internal carotid arteries; no significant flow abnormalities.  EKG  Procedure date:  09/15/2009  Findings:      AV sequential pacing at a rate of 70 bpm    Review of Systems       See history of present illness.  Vital Signs:  Patient profile:   77 year old female Weight:      273 pounds O2 Sat:      97 % on Room air Pulse rate:   84 / minute BP sitting:   166 / 82  (right arm)  Vitals Entered By: Dreama Saa, CNA (September 15, 2009 10:42 AM)  O2 Flow:  Room air  Physical Exam  General:    well developed; no acute distress:   Neck-No JVD; Minimal right and modest left carotid bruits: Lungs-No tachypnea, no rales; no rhonchi; no wheezes: Cardiovascular-normal PMI; normal S1 and increased P2; minimal systolic ejection murmur Abdomen-BS normal; soft and non-tender without masses or organomegaly:  Musculoskeletal-No deformities, no cyanosis or clubbing: Neurologic-Normal cranial nerves; symmetric strength and tone:  Skin-Warm, no significant lesions: Extremities-Nl distal pulses; 1-2+ edema:     PPM Specifications Following MD:  Lewayne Bunting, MD     PPM Vendor:  [employer]     PPM Model Number:  [id number]     PPM Serial Number:  [id number] PPM DOI:  12/24/2001     PPM Implanting MD:  Lewayne Bunting, MD  Lead 1    Location: [facility]     DOI: 12/24/2001     Model #: [id number]     Serial #: [id number]     Status: active Lead 2    Location: [facility]     DOI: 12/24/2001     Model #: [id number]     Serial #: [id number]     Status: active  Magnet Response Rate:  BOL 85 ERI 65  Indications:  Pre-syncope; 2nd AVB   PPM Follow  Up Pacer Dependent:  Yes      Episodes Coumadin:  No  Parameters Mode:  DDDR     Lower Rate Limit:  60     Upper Rate Limit:  130 Paced AV Delay:  250     Sensed AV Delay:  250  Impression & Recommendations:  Problem # 1:  ATHEROSCLEROTIC CARDIOVASCULAR DISEASE (ICD-429.2) Course has been remarkably benign with no significant cardiovascular events and probably no symptoms since coronary disease identified more than 10 years ago.  Management will continue to focus on optimal control of vascular risk factors.  Problem # 2:  CAROTID BRUIT (ICD-785.9) Ultrasound study of the carotids has not been obtained for the past 5 years despite known cerebrovascular disease, albeit nonobstructive.  A repeat study will be obtained.  Problem # 3:  HYPERTENSION (ICD-401.9) Blood pressure control is fair;  adjustment of antihypertensive medication may be required.  Problem # 4:  HYPERLIPIDEMIA (ICD-272.4) Recent lipid profile is excellent; current medication will be continued.  Problem # 5:  CHRONIC DIASTOLIC HEART FAILURE (ICD-428.32) Pedal edema and exertional dyspnea may not truly represent congestive heart failure, but medications can be adjusted in an attempt to ameliorate both problems.  Actos will be discontinued and Amaryl started.  Diltiazem will be substituted for amlodipine.  I will reassess control of all cardiac issues in 2 months.  Other Orders: T-Chest x-ray, 2 views (69629) Carotid Duplex (Carotid Duplex) Nutrition Referral (Nutrition)  Patient Instructions: 1)  Your physician recommends that you schedule a follow-up appointment in: 2 months 2)  A chest x-ray takes a picture of the organs and structures inside the chest, including the heart, lungs, and blood vessels. This test can show several things, including, whether the heart is enlarged; whether fluid is building up in the lungs; and whether pacemaker / defibrillator leads are still in place. 3)  Your physician has requested that  you have a carotid duplex. This test is an ultrasound of the carotid arteries in your neck. It looks at blood flow through these arteries that supply the brain with blood. Allow one hour for this exam. There are no restrictions or special instructions. 4)  Your physician discussed the importance of regular exercise and recommended that you start or continue a regular exercise program for good health. 5)  Your physician encouraged you to lose weight for better health. 6)  Your physician has recommended you make the following change in your medication: chnage amlodipine/benazepril to diltaizem 240mg  daily, stop actos, begin amaryl 2mg  daily Prescriptions: LOTENSIN 20 MG TABS (BENAZEPRIL HCL) Take 1 tablet by mouth once a day  #30 x 3   Entered by:   Teressa Lower RN   Authorized by:   Kathlen Brunswick, MD, [facility]   Signed by:   Teressa Lower RN on 09/15/2009   Method used:   Faxed to ...       Hospital doctor (retail)       [address]       [place], [place]  [address]       Ph: [phone] or [phone]       Fax: [phone]   RxID:   [id number] DILTIAZEM HCL ER BEADS 240 MG XR24H-CAP (DILTIAZEM HCL ER BEADS) Take one capsule by mouth daily  #30 x 3   Entered by:   Teressa Lower RN   Authorized by:   Kathlen Brunswick, MD, [facility]   Signed by:   Teressa Lower RN on 09/15/2009   Method used:   Faxed to ...       Hospital doctor (retail)       [address]       [place], [place]  [address]       Ph: [phone] or [phone]       Fax: [phone]   RxID:   [id number]

## 2010-07-13 NOTE — Assessment & Plan Note (Signed)
Summary: ACU/BACK PAIN/JSS   Vital Signs:  Patient profile:   77 year old female Height:      70 inches (177.80 cm) Weight:      266 pounds (120.91 kg) O2 Sat:      92 % on Room air Temp:     100.0 degrees F (37.78 degrees C) oral Pulse rate:   77 / minute BP sitting:   126 / 72  (left arm) Cuff size:   large  Vitals Entered By: Orlan Leavens RMA (May 16, 2010 8:12 AM)  O2 Flow:  Room air CC: Back pain Is Patient Diabetic? Yes Did you bring your meter with you today? No Pain Assessment Patient in pain? yes     Location: lower back Type: sharp Onset of pain  Pt also states when she urinate it hurt/burn   Primary Care Provider:  Newt Lukes MD  CC:  Back pain.  History of Present Illness: here for back pain - left flank pain a/w dysuria and suprapubic cramping - no gross hematuria onset 3 days ago -   current symptoms different than prior left abd pain and hip pain -  reviewed chronic med issues: LLQ abd pain - located left groin has seen GI and gyn for same - no abn identified prior back and ortho eval during hosp summer 2011 but unable to f/u with local ortho due to $$ concerns pain remains in BLQ L>R and suprapubic region pain worst when sitting or walking -  pain radiates into left leg also a/w knee pain L>R but not swelling pending appt at [facility]  HTN - reports compliance with ongoing medical treatment; recent changes in medication reviewed - prev off amlodipine in lotrel due to edema, not taking dilt b/c the opened cap casued her face to feel numb when she smelled it and ?if that med then causes her feet to be numb! pt brought pill bottles and all reviewed today - inc metoprolol since last OV  hypothyroid - reports compliance with ongoing medical treatment ;no changes in medication dose or frequency. denies adverse side effects related to current therapy except hard to split pill in half for combined dose = 212.5 mcg  DM2 - on insulin and oral  meds -  reports compliance with ongoing medical treatment; interval changes in medication reviewed - off actos due to fear SE, on generic amaryl and insulin. denies adverse side effects related to current therapy. no hypoglycemia symptoms or events - checks cbg 2x/d before meals - 90-130 -  dyslipidemia - reports remaining off medical treatment since summer 2011 and feels much improved - feels general weakness has resolved and no further diffuse muscle aches  Clinical Review Panels:  Immunizations   Last Tetanus Booster:  Td (08/29/2009)   Last Flu Vaccine:  Fluvax 3+ (02/28/2010)   Last Pneumovax:  Historical (03/11/2009)  Lipid Management   Cholesterol:  144 (12/11/2009)   LDL (bad choesterol):  82 (12/11/2009)   HDL (good cholesterol):  42 (12/11/2009)   Triglycerides:  101 (12/11/2009)  Diabetes Management   HgBA1C:  7.4 (04/19/2010)   Creatinine:  0.9 (03/17/2010)   Last Flu Vaccine:  Fluvax 3+ (02/28/2010)   Last Pneumovax:  Historical (03/11/2009)  CBC   WBC:  7.0 (12/30/2009)   RBC:  4.35 (12/30/2009)   Hgb:  13.7 (12/30/2009)   Hct:  39.7 (12/30/2009)   Platelets:  237.0 (12/30/2009)   MCV  91.3 (12/30/2009)   MCHC  34.5 (12/30/2009)  RDW  13.2 (12/30/2009)   PMN:  62.6 (12/30/2009)   Lymphs:  30.0 (12/30/2009)   Monos:  5.9 (12/30/2009)   Eosinophils:  1.1 (12/30/2009)   Basophil:  0.4 (12/30/2009)  Complete Metabolic Panel   Glucose:  111 (12/25/2009)   Sodium:  135 (12/25/2009)   Potassium:  4.1 (12/25/2009)   Chloride:  103 (12/25/2009)   CO2:  25 (12/25/2009)   BUN:  9 (03/17/2010)   Creatinine:  0.9 (03/17/2010)   Albumin:  4.0 (10/11/2009)   Total Protein:  7.1 (10/11/2009)   Calcium:  10.0 (12/25/2009)   Total Bili:  0.5 (10/11/2009)   Alk Phos:  87 (10/11/2009)   SGPT (ALT):  27 (10/11/2009)   SGOT (AST):  36 (10/11/2009)   Current Medications (verified): 1)  Levoxyl 200 Mcg Tabs (Levothyroxine Sodium) .... Take 1 By Mouth Once Daily 2)   Levoxyl 25 Mcg Tabs (Levothyroxine Sodium) .... One Tablet Every Other Day (In Addition The The 200 Micrograms Tablet) 3)  Humulin N 100 Unit/ml Susp (Insulin Isophane Human) .Marland Kitchen.. 15 Units in The Am and 15 Units in The  Pm 4)  Nitrolingual 0.4 Mg/spray Soln (Nitroglycerin) .... Inhale 1 Spray As Directed Every Two Hours 5)  Metoprolol Succinate 50 Mg Xr24h-Tab (Metoprolol Succinate) .... Take 1 By Mouth Once Daily 6)  Aspirin 81 Mg Tbec (Aspirin) .... Take One Tablet By Mouth Daily 7)  Gabapentin 300 Mg Caps (Gabapentin) .... Take 1 By Mouth At Bedtime 8)  Alprazolam 0.5 Mg Tabs (Alprazolam) .... Take 1/2-1 Tab By Mouth Two Times A Day As Needed 9)  Vitamin D3 1000 Unit Tabs (Cholecalciferol) .... Take 1 Cap Daily 10)  Proventil Hfa 108 (90 Base) Mcg/act Aers (Albuterol Sulfate) .... Use As Needed 11)  Omeprazole 20 Mg Cpdr (Omeprazole) .... Take 1 By Mouth Qd 12)  Cyanocobalamin 1000 Mcg/ml Soln (Cyanocobalamin) .... Take 1 Injection Im Q Month 13)  Bd Luer-Lok Syringe 23g X 1-1/4" 3 Ml Misc (Syringe/needle (Disp)) .... Use 1 Q Month To Dispense B12 14)  Glimepiride 2 Mg Tabs (Glimepiride) .Marland Kitchen.. 1 By Mouth Every Am 15)  Benazepril Hcl 20 Mg Tabs (Benazepril Hcl) .... Take 1 Tablet By Mouth Once A Day 16)  Hydrocodone-Acetaminophen 5-500 Mg Tabs (Hydrocodone-Acetaminophen) .... Take 1/2 -1 Tab Every 8 Hours As Needed For Pain 17)  Vesicare 5 Mg Tabs (Solifenacin Succinate) .Marland Kitchen.. 1 By Mouth Once Daily 18)  Furosemide 40 Mg Tabs (Furosemide) .Marland Kitchen.. 1 By Mouth Daily As Needed For Swelling 19)  Klor-Con M20 20 Meq Cr-Tabs (Potassium Chloride Crys Cr) .Marland Kitchen.. 1 By Mouth Once Daily As Needed With Furosemide For Swelling 20)  Nystatin 100000 Unit/gm Powd (Nystatin) .... Apply To Affected Skin Three Times A Day As Needed For Yeast 21)  Vitamin E 400 Unit Caps (Vitamin E) .... Take 1 Tab Daily 22)  Amlodipine Besylate 10 Mg Tabs (Amlodipine Besylate) .... Take 1 Tablet By Mouth Once Daily  Allergies  (verified): 1)  ! Penicillin  Past History:  Past Medical History: ASCVD: Cath in 9/99-70% mid LAD with diffuse distal disease, 70% T1, 60% mid circumflex,       50% mid RCA, normal ejection fraction; negative stress nuclear in 07/2008  Cerebrovascular disease-carotid bruits; no focal disease in 1999, 2002 and 2006 Conduction system disease-dual-chamber pacemaker implant ([employer])-2003 Hx CHF with preserved LV systolic function  Hypertension   Hyperlipidemia  Diabetes, type II-insulin-dependent IRRITABLE BOWEL SYNDROME (ICD-564.1)-mixed      Colonoscopy 2008 non-specific colitis, IH, [place] Diverticulosis, RECTAL ULCER  2o to ASA      Dysphagia-BaSw 2009-nonspecific esophageal motility disorder DUODENAL DIVERTICULUM   GERD w/ HH, esophigitis   COPD OSTEOARTHRITIS - bilateral TKA HYPOTHYROIDISM  IMPINGEMENT SYNDROME, left shoulder  Physician roster: Cardiology: [facility]; [facility] - [facility]  Review of Systems       The patient complains of difficulty walking.  The patient denies anorexia, weight loss, chest pain, and headaches.         c/o itch BLE with dry skin, esp after shower  Physical Exam  General:  overweight-appearing.  alert, well-developed, well-nourished, and cooperative to examination. nontoxic - spouse at side Lungs:  normal respiratory effort, no intercostal retractions or use of accessory muscles; normal breath sounds bilaterally - no crackles and no wheezes.    Heart:  normal rate, regular rhythm, no murmur, and no rub. BLE with trace chronic edema. Abdomen:  soft, mild tender in suprapubic region but no rebound, normal bowel sounds, no distention; no masses and no appreciable hepatomegaly or splenomegaly.  +left flank tender to palp Neurologic:  alert & oriented X3 and cranial nerves II-XII symetrically intact.  strength normal in all extremities, sensation intact to light touch, and gait  slow but normal with asst RW. speech fluent without  dysarthria or aphasia; follows commands with good comprehension.  Skin:  excema on BLE   Impression & Recommendations:  Problem # 1:  UTI (ICD-599.0)  suspect pyleo with flank pain, dysuria and +Udip - send Ucx and tx 7d cipro - Her updated medication list for this problem includes:    Vesicare 5 Mg Tabs (Solifenacin succinate) .Marland Kitchen... 1 by mouth once daily    Cipro 500 Mg Tabs (Ciprofloxacin hcl) .Marland Kitchen... 1 by mouth two times a day x 7 days  Orders: UA Dipstick w/o Micro (manual) ([address]) T-Culture, Urine ([id number])  Encouraged to push clear liquids, get enough rest, and take acetaminophen as needed. To be seen in 10 days if no improvement, sooner if worse.  Problem # 2:  DERMATITIS, ATOPIC (ICD-691.8)  Her updated medication list for this problem includes:    Triamcinolone Acetonide 0.1 % Lotn (Triamcinolone acetonide) .Marland Kitchen... Apply to affected skin two times a day as needed for itch  Discussed use of medication and avoidance of irritating agents. Also stressed importance of moisturizers.   Complete Medication List: 1)  Levoxyl 200 Mcg Tabs (Levothyroxine sodium) .... Take 1 by mouth once daily 2)  Levoxyl 25 Mcg Tabs (Levothyroxine sodium) .... One tablet every other day (in addition the the 200 micrograms tablet) 3)  Humulin N 100 Unit/ml Susp (Insulin isophane human) .Marland Kitchen.. 15 units in the am and 15 units in the  pm 4)  Nitrolingual 0.4 Mg/spray Soln (Nitroglycerin) .... Inhale 1 spray as directed every two hours 5)  Metoprolol Succinate 50 Mg Xr24h-tab (Metoprolol succinate) .... Take 1 by mouth once daily 6)  Aspirin 81 Mg Tbec (Aspirin) .... Take one tablet by mouth daily 7)  Gabapentin 300 Mg Caps (Gabapentin) .... Take 1 by mouth at bedtime 8)  Alprazolam 0.5 Mg Tabs (Alprazolam) .... Take 1/2-1 tab by mouth two times a day as needed 9)  Vitamin D3 1000 Unit Tabs (Cholecalciferol) .... Take 1 cap daily 10)  Proventil Hfa 108 (90 Base) Mcg/act Aers (Albuterol sulfate) .... Use  as needed 11)  Omeprazole 20 Mg Cpdr (Omeprazole) .... Take 1 by mouth qd 12)  Cyanocobalamin 1000 Mcg/ml Soln (Cyanocobalamin) .... Take 1 injection im q month 13)  Bd Luer-lok  Syringe 23g X 1-1/4" 3 Ml Misc (Syringe/needle (disp)) .... Use 1 q month to dispense b12 14)  Glimepiride 2 Mg Tabs (Glimepiride) .Marland Kitchen.. 1 by mouth every am 15)  Benazepril Hcl 20 Mg Tabs (Benazepril hcl) .... Take 1 tablet by mouth once a day 16)  Hydrocodone-acetaminophen 5-500 Mg Tabs (Hydrocodone-acetaminophen) .... Take 1/2 -1 tab every 8 hours as needed for pain 17)  Vesicare 5 Mg Tabs (Solifenacin succinate) .Marland Kitchen.. 1 by mouth once daily 18)  Furosemide 40 Mg Tabs (Furosemide) .Marland Kitchen.. 1 by mouth daily as needed for swelling 19)  Klor-con M20 20 Meq Cr-tabs (Potassium chloride crys cr) .Marland Kitchen.. 1 by mouth once daily as needed with furosemide for swelling 20)  Nystatin 100000 Unit/gm Powd (Nystatin) .... Apply to affected skin three times a day as needed for yeast 21)  Vitamin E 400 Unit Caps (Vitamin e) .... Take 1 tab daily 22)  Amlodipine Besylate 10 Mg Tabs (Amlodipine besylate) .... Take 1 tablet by mouth once daily 23)  Cipro 500 Mg Tabs (Ciprofloxacin hcl) .Marland Kitchen.. 1 by mouth two times a day x 7 days 24)  Triamcinolone Acetonide 0.1 % Lotn (Triamcinolone acetonide) .... Apply to affected skin two times a day as needed for itch  Patient Instructions: 1)  it was good to see you today. 2)  cipro for kidney and bladder infection 3)  steroid lotion for itchy rash on legs 4)  your prescriptions have been faxed to your pharmacy. Please take as directed. Contact our office if you believe you're having problems with the medication(s).  5)  Please keep scheduled follow-up appointment in Feb 2012 for diabetes check, sooner if problems.  Prescriptions: TRIAMCINOLONE ACETONIDE 0.1 % LOTN (TRIAMCINOLONE ACETONIDE) apply to affected skin two times a day as needed for itch  #1 x 1   Entered and Authorized by:   Newt Lukes MD    Signed by:   Newt Lukes MD on 05/16/2010   Method used:   [facility]ed to ...       [facility]       [address]       [place], [place]  [address]       Ph: [phone] or [phone]       Fax: [phone]   RxID:   [id number] CIPRO 500 MG TABS (CIPROFLOXACIN HCL) 1 by mouth two times a day x 7 days  #14 x 0   Entered and Authorized by:   Newt Lukes MD   Signed by:   Newt Lukes MD on 05/16/2010   Method used:   Faxed to ...       [facility]retail[facility]       [address]       [place], [place]  [address]       Ph: [phone] or [phone]       Fax: [phone]   RxID:   [id number]    Orders Added: 1)  UA Dipstick w/o Micro (manual) [[address]1002] 2)  Est. Patient Level IV [[address]] 3)  T-Culture, Urine [[id number]]    Laboratory Results   Urine Tests    Routine Urinalysis   Color: yellow Appearance: Cloudy Glucose: negative   (Normal Range: Negative) Bilirubin: negative   (Normal Range: Negative) Ketone: negative   (Normal Range: Negative) Spec. Gravity: <1.005   (Normal Range: 1.003-1.035) Blood: large   (  Normal Range: Negative) pH: 5.0   (Normal Range: 5.0-8.0) Protein: >=300   (Normal Range: Negative) Urobilinogen: 0.2   (Normal Range: 0-1) Nitrite: positive   (Normal Range: Negative) Leukocyte Esterace: large   (Normal Range: Negative)

## 2010-07-13 NOTE — Progress Notes (Signed)
Summary: appt  Phone Note Call from Patient   Caller: Patient Summary of Call: Pt call left msg recieved call stating she had appt for Monday 02/27/10. Not suppose to come back until 04/19/10. Called pt back at last visit she fail to have appt cancel. Will just keep appt for 04/29/10. Initial call taken by: Orlan Leavens RMA,  February 24, 2010 11:45 AM

## 2010-07-13 NOTE — Assessment & Plan Note (Signed)
Summary: 3 MO ROV /NWS  #   Vital Signs:  Patient profile:   77 year old female Height:      70 inches (177.80 cm) Weight:      266.6 pounds (121.18 kg) O2 Sat:      94 % on Room air Temp:     97.5 degrees F (36.39 degrees C) oral Pulse rate:   88 / minute BP sitting:   120 / 72  (left arm) Cuff size:   large  Vitals Entered By: Orlan Leavens RMA (April 19, 2010 1:15 PM)  O2 Flow:  Room air CC: 3 month follow-up Is Patient Diabetic? Yes Did you bring your meter with you today? Yes Pain Assessment Patient in pain? no      Comments Pt want to discuss omeprazole med. Also pt want refill on Deltrol LA   Primary Care Provider:  Newt Lukes MD  CC:  3 month follow-up.  History of Present Illness: here for f/u  1) HTN - reports compliance with ongoing medical treatment; recent changes in medication reviewed - prev off amlodipine in lotrel due to edema, not taking dilt b/c the opened cap casued her face to feel numb when she smelled it and ?if that med then causes her feet to be numb! pt brought pill bottles and all reviewed today - inc metoprolol since last OV  2) hypothyroid - reports compliance with ongoing medical treatment ;no changes in medication dose or frequency. denies adverse side effects related to current therapy except hard to split pill in half for combined dose = 212.5 mcg  3) DM2 - on insulin and oral meds -  reports compliance with ongoing medical treatment; interval changes in medication reviewed - off actos due to fear SE, on generic amaryl and insulin. denies adverse side effects related to current therapy. no hypoglycemia symptoms or events - checks cbg 2x/d before meals - 90-130 -  4) dyslipidemia - reports remaining off medical treatment since summer 2011 and feels much improved - feels general weakness has resolved and no further diffuse muscle aches  Clinical Review Panels:  Diabetes Management   HgBA1C:  7.4 (12/11/2009)   Creatinine:  0.9  (03/17/2010)   Last Flu Vaccine:  Fluvax 3+ (02/28/2010)   Last Pneumovax:  Historical (03/11/2009)  CBC   WBC:  7.0 (12/30/2009)   RBC:  4.35 (12/30/2009)   Hgb:  13.7 (12/30/2009)   Hct:  39.7 (12/30/2009)   Platelets:  237.0 (12/30/2009)   MCV  91.3 (12/30/2009)   MCHC  34.5 (12/30/2009)   RDW  13.2 (12/30/2009)   PMN:  62.6 (12/30/2009)   Lymphs:  30.0 (12/30/2009)   Monos:  5.9 (12/30/2009)   Eosinophils:  1.1 (12/30/2009)   Basophil:  0.4 (12/30/2009)  Complete Metabolic Panel   Glucose:  111 (12/25/2009)   Sodium:  135 (12/25/2009)   Potassium:  4.1 (12/25/2009)   Chloride:  103 (12/25/2009)   CO2:  25 (12/25/2009)   BUN:  9 (03/17/2010)   Creatinine:  0.9 (03/17/2010)   Albumin:  4.0 (10/11/2009)   Total Protein:  7.1 (10/11/2009)   Calcium:  10.0 (12/25/2009)   Total Bili:  0.5 (10/11/2009)   Alk Phos:  87 (10/11/2009)   SGPT (ALT):  27 (10/11/2009)   SGOT (AST):  36 (10/11/2009)   Current Medications (verified): 1)  Levoxyl 200 Mcg Tabs (Levothyroxine Sodium) .... Take 1 By Mouth Once Daily 2)  Levoxyl 25 Mcg Tabs (Levothyroxine Sodium) .... One Tablet Every  Other Day (In Addition The The 200 Micrograms Tablet) 3)  Humulin N 100 Unit/ml Susp (Insulin Isophane Human) .Marland Kitchen.. 10 Units in The Am and 10 Units in The  Pm 4)  Nitrolingual 0.4 Mg/spray Soln (Nitroglycerin) .... Inhale 1 Spray As Directed Every Two Hours 5)  Metoprolol Succinate 50 Mg Xr24h-Tab (Metoprolol Succinate) .... Take 1 By Mouth Once Daily 6)  Aspirin 81 Mg Tbec (Aspirin) .... Take One Tablet By Mouth Daily 7)  Gabapentin 300 Mg Caps (Gabapentin) .... Take 1 By Mouth Two Times A Day 8)  Alprazolam 0.5 Mg Tabs (Alprazolam) .... Take 1/2-1 Tab By Mouth Two Times A Day As Needed 9)  Vitamin D3 1000 Unit Tabs (Cholecalciferol) .... Take 1 Cap Daily 10)  Proventil Hfa 108 (90 Base) Mcg/act Aers (Albuterol Sulfate) .... Use As Needed 11)  Omeprazole 20 Mg Cpdr (Omeprazole) .... Take 1 By Mouth Qd 12)   Cyanocobalamin 1000 Mcg/ml Soln (Cyanocobalamin) .... Take 1 Injection Im Q Month 13)  Bd Luer-Lok Syringe 23g X 1-1/4" 3 Ml Misc (Syringe/needle (Disp)) .... Use 1 Q Month To Dispense B12 14)  Glimepiride 2 Mg Tabs (Glimepiride) .Marland Kitchen.. 1 By Mouth Every Am 15)  Benazepril Hcl 20 Mg Tabs (Benazepril Hcl) .... Take 1 Tablet By Mouth Once A Day 16)  Hydrocodone-Acetaminophen 5-500 Mg Tabs (Hydrocodone-Acetaminophen) .... Take 1/2 -1 Tab Every 8 Hours As Needed For Pain 17)  Detrol La 4 Mg Xr24h-Cap (Tolterodine Tartrate) .... Take 1 Cap Daily 18)  Furosemide 40 Mg Tabs (Furosemide) .Marland Kitchen.. 1 By Mouth Daily As Needed For Swelling 19)  Klor-Con M20 20 Meq Cr-Tabs (Potassium Chloride Crys Cr) .Marland Kitchen.. 1 By Mouth Once Daily As Needed With Furosemide For Swelling 20)  Nystatin 100000 Unit/gm Powd (Nystatin) .... Apply To Affected Skin Three Times A Day As Needed For Yeast 21)  Vitamin E 400 Unit Caps (Vitamin E) .... Take 1 Tab Daily 22)  Amlodipine Besylate 10 Mg Tabs (Amlodipine Besylate) .... Take 1 Tablet By Mouth Once Daily  Allergies (verified): 1)  ! Penicillin  Past History:  Past Medical History: ASCVD: Cath in 9/99-70% mid LAD with diffuse distal disease, 70% T1, 60% mid circumflex,       50% mid RCA, normal ejection fraction; negative stress nuclear in 07/2008  Cerebrovascular disease-carotid bruits; no focal disease in 1999, 2002 and 2006 Conduction system disease-dual-chamber pacemaker implant ([employer])-2003 Hx CHF with preserved LV systolic function  Hypertension   Hyperlipidemia Diabetes, type II-insulin-dependent IRRITABLE BOWEL SYNDROME (ICD-564.1)-mixed      Colonoscopy 2008 non-specific colitis, IH, [place] Diverticulosis, RECTAL ULCER 2o to ASA      Dysphagia-BaSw 2009-nonspecific esophageal motility disorder DUODENAL DIVERTICULUM   GERD w/ HH, esophigitis   COPD OSTEOARTHRITIS - bilateral TKA HYPOTHYROIDISM  IMPINGEMENT SYNDROME, left shoulder  Physician roster: Cardiology:  Dietrich Pates; [facility] ()  Review of Systems       c/o numbness both hands when sleeping, also left hip and right knee pain.  Physical Exam  General:  overweight-appearing.  alert, well-developed, well-nourished, and cooperative to examination.  Lungs:  normal respiratory effort, no intercostal retractions or use of accessory muscles; normal breath sounds bilaterally - no crackles and no wheezes.    Heart:  normal rate, regular rhythm, no murmur, and no rub. BLE with trace chronic edema.   Impression & Recommendations:  Problem # 1:  DIABETES MELLITUS, TYPE II, ON INSULIN (ICD-250.00)  inc insulin dose since off actose 02/2010 - cont [facility] check lab now - home  cbg reviewed Her updated medication list for this problem includes:    Humulin N 100 Unit/ml Susp (Insulin isophane human) .Marland Kitchen... 12 units in the am and 12 units in the  pm    Aspirin 81 Mg Tbec (Aspirin) .Marland Kitchen... Take one tablet by mouth daily    Glimepiride 2 Mg Tabs (Glimepiride) .Marland Kitchen... 1 by mouth every am    Benazepril Hcl 20 Mg Tabs (Benazepril hcl) .Marland Kitchen... Take 1 tablet by mouth once a day  Orders: TLB-A1C / Hgb A1C (Glycohemoglobin) (83036-A1C)  Labs Reviewed: Creat: 0.9 (03/17/2010)    Reviewed HgBA1c results: 7.4 (12/11/2009)  7.2 (08/29/2009)  Problem # 2:  HIP PAIN, LEFT (ICD-719.45)  Her updated medication list for this problem includes:    Aspirin 81 Mg Tbec (Aspirin) .Marland Kitchen... Take one tablet by mouth daily    Hydrocodone-acetaminophen 5-500 Mg Tabs (Hydrocodone-acetaminophen) .Marland Kitchen... Take 1/2 -1 tab every 8 hours as needed for pain  Orders: Orthopedic Referral (Ortho)  Problem # 3:  ARTHRITIS, RIGHT KNEE (ICD-716.96)  Orders: Orthopedic Referral (Ortho)  Problem # 4:  CARPAL TUNNEL SYNDROME (ICD-354.0)  Orders: Splints- All Types (Z6109)  Complete Medication List: 1)  Levoxyl 200 Mcg Tabs (Levothyroxine sodium) .... Take 1 by mouth once daily 2)  Levoxyl 25 Mcg Tabs (Levothyroxine sodium)  .... One tablet every other day (in addition the the 200 micrograms tablet) 3)  Humulin N 100 Unit/ml Susp (Insulin isophane human) .Marland Kitchen.. 12 units in the am and 12 units in the  pm 4)  Nitrolingual 0.4 Mg/spray Soln (Nitroglycerin) .... Inhale 1 spray as directed every two hours 5)  Metoprolol Succinate 50 Mg Xr24h-tab (Metoprolol succinate) .... Take 1 by mouth once daily 6)  Aspirin 81 Mg Tbec (Aspirin) .... Take one tablet by mouth daily 7)  Gabapentin 300 Mg Caps (Gabapentin) .... Take 1 by mouth at bedtime 8)  Alprazolam 0.5 Mg Tabs (Alprazolam) .... Take 1/2-1 tab by mouth two times a day as needed 9)  Vitamin D3 1000 Unit Tabs (Cholecalciferol) .... Take 1 cap daily 10)  Proventil Hfa 108 (90 Base) Mcg/act Aers (Albuterol sulfate) .... Use as needed 11)  Omeprazole 20 Mg Cpdr (Omeprazole) .... Take 1 by mouth qd 12)  Cyanocobalamin 1000 Mcg/ml Soln (Cyanocobalamin) .... Take 1 injection im q month 13)  Bd Luer-lok Syringe 23g X 1-1/4" 3 Ml Misc (Syringe/needle (disp)) .... Use 1 q month to dispense b12 14)  Glimepiride 2 Mg Tabs (Glimepiride) .Marland Kitchen.. 1 by mouth every am 15)  Benazepril Hcl 20 Mg Tabs (Benazepril hcl) .... Take 1 tablet by mouth once a day 16)  Hydrocodone-acetaminophen 5-500 Mg Tabs (Hydrocodone-acetaminophen) .... Take 1/2 -1 tab every 8 hours as needed for pain 17)  Detrol La 4 Mg Xr24h-cap (Tolterodine tartrate) .... Take 1 cap daily 18)  Furosemide 40 Mg Tabs (Furosemide) .Marland Kitchen.. 1 by mouth daily as needed for swelling 19)  Klor-con M20 20 Meq Cr-tabs (Potassium chloride crys cr) .Marland Kitchen.. 1 by mouth once daily as needed with furosemide for swelling 20)  Nystatin 100000 Unit/gm Powd (Nystatin) .... Apply to affected skin three times a day as needed for yeast 21)  Vitamin E 400 Unit Caps (Vitamin e) .... Take 1 tab daily 22)  Amlodipine Besylate 10 Mg Tabs (Amlodipine besylate) .... Take 1 tablet by mouth once daily  Patient Instructions: 1)  it was good to see you today. 2)   increase insulin to 12units two times a day  3)  other medications reviewed -  4)  we'll make referral to orthopedist for hip and knee pain. Our office will contact you regarding this appointment once made.  5)  wear wrist splints at bedtime for numbness 6)  test(s) ordered today - your results will be posted on the phone tree for review in 48-72 hours from the time of test completion; call [phone] and enter your 9 digit MRN (listed above on this page, just below your name); if any changes need to be made or there are abnormal results, you will be contacted directly.  7)  Please schedule a follow-up appointment in 3 months for diabetes, sooner if problems.  Prescriptions: HYDROCODONE-ACETAMINOPHEN 5-500 MG TABS (HYDROCODONE-ACETAMINOPHEN) take 1/2 -1 tab every 8 hours as needed for pain  #30 x 1   Entered by:   Orlan Leavens RMA   Authorized by:   Newt Lukes MD   Signed by:   Orlan Leavens RMA on 04/19/2010   Method used:   Printed then faxed to ...       [facility]       [address]       [place], [place]  [address]       Ph: [phone] or [phone]       Fax: [phone]   RxID:   [id number] DETROL LA 4 MG XR24H-CAP (TOLTERODINE TARTRATE) TAKE 1 CAP DAILY  #30 x 5   Entered by:   Orlan Leavens RMA   Authorized by:   Newt Lukes MD   Signed by:   Orlan Leavens RMA on 04/19/2010   Method used:   Faxed to ...       [facility]ospital [facility] (retail)       [address]       [place], [place]  [address]       Ph: [phone] or [phone]       Fax: [phone]   RxID:   [id number]    Orders Added: 1)  TLB-A1C / Hgb A1C (Glycohemoglobin) [83036-A1C] 2)  Est. Patient Level IV [62831] 3)  Orthopedic Referral [Ortho] 4)  Splints- All Types [A4570]

## 2010-07-13 NOTE — Progress Notes (Signed)
Summary: Insulin change  Phone Note Call from Patient Call back at Home Phone [phone]   Caller: Patient Summary of Call: pt called to infom MD that her CBG were extremely low yesterday so she decreased her insulin from 10u two times a day to 5u two times a day. Pt says her CBG are much better this morning at 113. Is MD okay with change? Initial call taken by: Margaret Pyle, CMA,  Nov 03, 2009 10:40 AM  Follow-up for Phone Call        ok to change to 5 units two times a day -  Follow-up by: Newt Lukes MD,  Nov 03, 2009 10:56 AM  Additional Follow-up for Phone Call Additional follow up Details #1::        pt informed  Additional Follow-up by: Margaret Pyle, CMA,  Nov 03, 2009 11:33 AM     Appended Document: Insulin change pt states that she meant to say on VM that her CBG were high and she increased her insulin by 5u both am and pm.

## 2010-07-13 NOTE — Assessment & Plan Note (Signed)
Summary: NUMBNESS IN LEGS-LB   Vital Signs:  Patient profile:   77 year old female Height:      70 inches (177.80 cm) Weight:      260.0 pounds (118.18 kg) O2 Sat:      95 % on Room air Temp:     98.2 degrees F (36.78 degrees C) oral Pulse rate:   88 / minute BP sitting:   132 / 74  (left arm) Cuff size:   large  Vitals Entered By: Orlan Leavens (December 30, 2009 11:26 AM)  O2 Flow:  Room air CC: Numbness in legs Is Patient Diabetic? Yes Did you bring your meter with you today? No Pain Assessment Patient in pain? yes     Location: Both legs Type: tingling Onset of pain  pt states sxs been going on x's 3 weeks. Its a tingley feeling that goes up to her vagina   Primary Care Provider:  Newt Lukes MD  CC:  Numbness in legs.  History of Present Illness: c/o spasm in bladder region/between legs no hematuria, no change in BMs continued numbness also continued feeling fatigued and weak onset >6 months ago, progressive course no significant change off crestor and meclizine - still hard to stay awake during the day - occ trouble sleeping at night no fever or hematuria, no CP or SOB or cough or swelling - one fall , ? muscle weakness - no radiating pain but int numbness in both feet - labs reviewed: + b12 defic identified 10/2009 - 1st shot done 10/11/09- 2nd 6/3 (taking shots at home) seen by neuro during 12/2009 hosp for fall and weakness/numbness - felt d/t dm neuropathy - started on topamax - now off d/t diarrhea + wt loss still very concerned mildew under the house is causing her to become ill  also reviewed other med issues: 1) HTN - reports noncompliance with ongoing medical treatment; recent changes in medication reviewed - off amlodipine in lotrel due tp edema, no taking dilt b/c the opened cap casued her face to feel numb when she smelled it and ?if that med then causes her feet to be numb!Marland Kitchen pt brought pill bottles and all reviewed today  2) hypothyroid - reports  compliance with ongoing medical treatment ;no changes in medication dose or frequency. denies adverse side effects related to current therapy except hard to split pill in half for combined dose = 212.5 mcg  3) DM2 - on insulin and oral meds -  reports compliance with ongoing medical treatment; interval changes in medication reviewed - now off actos but wants to resume, on generic amaryl. denies adverse side effects related to current therapy. no hypoglycemia symptoms or events - checks cbg 2x/d before meals - 90-130 - would like to stop insulin and take only pills  4) dyslipidemia - reports compliance with ongoing medical treatment and changes in medication reviewed - feels generally weak and c/o diffuse muscle aches (see above, no change on/off statin)  Clinical Review Panels:  Diabetes Management   HgBA1C:  7.4 (12/11/2009)   Creatinine:  0.92 (12/25/2009)   Last Flu Vaccine:  Historical (03/11/2009)   Last Pneumovax:  Historical (03/11/2009)  CBC   WBC:  7.1 (12/25/2009)   RBC:  4.41 (12/25/2009)   Hgb:  13.9 (12/25/2009)   Hct:  41.0 (12/25/2009)   Platelets:  220 (12/25/2009)   MCV  92.8 (12/25/2009)   MCHC  34.3 (10/11/2009)   RDW  13.4 (12/25/2009)   PMN:  49 (12/25/2009)  Lymphs:  34.4 (10/11/2009)   Monos:  9 (12/25/2009)   Eosinophils:  2 (12/25/2009)   Basophil:  1 (12/25/2009)  Complete Metabolic Panel   Glucose:  111 (12/25/2009)   Sodium:  135 (12/25/2009)   Potassium:  4.1 (12/25/2009)   Chloride:  103 (12/25/2009)   CO2:  25 (12/25/2009)   BUN:  14 (12/25/2009)   Creatinine:  0.92 (12/25/2009)   Albumin:  4.0 (10/11/2009)   Total Protein:  7.1 (10/11/2009)   Calcium:  10.0 (12/25/2009)   Total Bili:  0.5 (10/11/2009)   Alk Phos:  87 (10/11/2009)   SGPT (ALT):  27 (10/11/2009)   SGOT (AST):  36 (10/11/2009)   Current Medications (verified): 1)  Levoxyl 200 Mcg Tabs (Levothyroxine Sodium) .... Take 1 By Mouth Once Daily 2)  Levoxyl 25 Mcg Tabs  (Levothyroxine Sodium) .... Take One Half Tablet in Addition The The 200 Micrograms Tablet 3)  Humulin N 100 Unit/ml Susp (Insulin Isophane Human) .Marland Kitchen.. 10 Units in The Am and 10 Units in The  Pm 4)  Nitrolingual 0.4 Mg/spray Soln (Nitroglycerin) .... Inhale 1 Spray As Directed Every Two Hours 5)  Metoprolol Succinate 50 Mg Xr24h-Tab (Metoprolol Succinate) .... Take 1/2 By Mouth Qd 6)  Aspirin 81 Mg Tbec (Aspirin) .... Take One Tablet By Mouth Daily 7)  Vitamin E 1000 Unit Caps (Vitamin E) .... Take 1 Tablet By Mouth Once A Day 8)  Gabapentin 300 Mg Caps (Gabapentin) .... Take 1 By Mouth Once Daily As Needed 9)  Alprazolam 0.5 Mg Tabs (Alprazolam) .... Take 1 Two Times A Day As Needed - To Fill June 2 , 2011 10)  Vitamin D3 1000 Unit Tabs (Cholecalciferol) .... Take 1 Cap Daily 11)  Proventil Hfa 108 (90 Base) Mcg/act Aers (Albuterol Sulfate) .... Use As Needed 12)  Omeprazole 20 Mg Cpdr (Omeprazole) .... Take 1 By Mouth Qd 13)  Cyanocobalamin 1000 Mcg/ml Soln (Cyanocobalamin) .... Take 1 Injection Im Q Month 14)  Bd Luer-Lok Syringe 23g X 1-1/4" 3 Ml Misc (Syringe/needle (Disp)) .... Use 1 Q Month To Dispense B12 15)  Actos 30 Mg Tabs (Pioglitazone Hcl) .... Take 1 Tablet By Mouth Once A Day 16)  Benazepril Hcl 20 Mg Tabs (Benazepril Hcl) .... Take 1 Tablet By Mouth Once A Day 17)  Hydrocodone-Acetaminophen 5-500 Mg Tabs (Hydrocodone-Acetaminophen) .... Take 1/2 -1 Tab Every 8 Hours As Needed For Pain 18)  Detrol La 4 Mg Xr24h-Cap (Tolterodine Tartrate) .... Take 1 Cap Daily 19)  Amitriptyline Hcl 25 Mg Tabs (Amitriptyline Hcl) .Marland Kitchen.. 1 By Mouth At Bedtime 20)  Furosemide 40 Mg Tabs (Furosemide) .Marland Kitchen.. 1 By Mouth Daily As Needed For Swelling 21)  Klor-Con M20 20 Meq Cr-Tabs (Potassium Chloride Crys Cr) .Marland Kitchen.. 1 By Mouth Once Daily As Needed With Furosemide For Swelling 22)  Nystatin 100000 Unit/gm Powd (Nystatin) .... Apply To Affected Skin Three Times A Day As Needed For Yeast 23)  Amlodipine  Besylate 5 Mg Tabs (Amlodipine Besylate) .... Take One Tablet By Mouth Daily  Allergies (verified): 1)  ! Penicillin  Past History:  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 History of congestive heart failure with preserved LV systolic function  Hypertension Hyperlipidemia Diabetes, type II-insulin-dependent IRRITABLE BOWEL SYNDROME (ICD-564.1)-mixed  Colonoscopy 2008 non-specific colitis, IH, [place] Diverticulosis, RECTAL ULCER 2o to ASA      Dysphagia-BaSw 2009-nonspecific esophageal motility disorder DUODENAL DIVERTICULUM GERD w/ HH, esophigitis   COPD OSTEOARTHRITIS - bilateral TKA HYPOTHYROIDISM  IMPINGEMENT SYNDROME, left shoulder  Physician roster: Cardiology: Dietrich Pates; [facility] - manus ([place])  Review of Systems  The patient denies fever, syncope, headaches, hematuria, and incontinence.    Physical Exam  General:  overweight-appearing.  alert, well-developed, well-nourished, and cooperative to examination. spouse at side Lungs:  normal respiratory effort, no intercostal retractions or use of accessory muscles; normal breath sounds bilaterally - no crackles and no wheezes.    Heart:  normal rate, regular rhythm, no murmur, and no rub. BLE with trace chronic edema. Abdomen:  soft, non-tender, normal bowel sounds, no distention; no masses and no appreciable hepatomegaly or splenomegaly.   Msk:  back: full range of motion of lumbar spine. Nontender to palpation. Negative straight leg raise. Deep tendon reflexes symmetrically intact at Achilles and patella, negative clonus. Sensation intact throughout all dermatomes in bilateral lower extremities. Full strength to  manual muscle testing in all major muscule groups including EHL, anterior tibialis, gastrocnemius, quadriceps, and iliopsoas. Able to heel and toe walk without difficulty and ambulates with a normal gait.  Neurologic:  alert & oriented X3 and cranial nerves II-XII symetrically intact.  strength normal in all extremities, sensation intact to light touch, and gait  slow but normal with asst RW. speech fluent without dysarthria or aphasia; follows commands with good comprehension.    Impression & Recommendations:  Problem # 1:  DYSURIA (ICD-788.1)  expect UTI and bladder spasms causing current symptoms - check Udip/micro and Ucx now -  also CBC - start emperic abx + pyridium - Her updated medication list for this problem includes:    Detrol La 4 Mg Xr24h-cap (Tolterodine tartrate) .Marland Kitchen... Take 1 cap daily    Cipro 500 Mg Tabs (Ciprofloxacin hcl) .Marland Kitchen... 1 by mouth two times a day x 5 days  Orders: TLB-CBC Platelet - w/Differential (85025-CBCD) TLB-Udip w/ Micro (81001-URINE) T-Culture, Urine ([id number])  Encouraged to push clear liquids, get enough rest, and take acetaminophen as needed. To be seen in 10 days if no improvement, sooner if worse.  Problem # 2:  HYPOTHYROIDISM (ICD-244.9)  Her updated medication list for this problem includes:    Levoxyl 200 Mcg Tabs (Levothyroxine sodium) .Marland Kitchen... Take 1 by mouth once daily    Levoxyl 25 Mcg Tabs (Levothyroxine sodium) .Marland Kitchen... Take one half tablet in addition the the 200 micrograms tablet  Orders: TLB-T3, Free (Triiodothyronine) (84481-T3FREE) TLB-T4 (Thyrox), Free [phone])  pt would like to take 2-112 tabs if possible for ease of med splitting -  will check  FT3, T4 before advising on this   Labs Reviewed: TSH: 1.945 (12/11/2009)    HgBA1c: 7.4 (12/11/2009) Chol: 144 (12/11/2009)   HDL: 42 (12/11/2009)   LDL: 82 (12/11/2009)   TG: 101 (12/11/2009)  Problem # 3:  ANXIETY (ICD-300.00) has weaned off alprazolam as per rec of cards  (11/22/09 OV with cardiology reviewed) - pt feels being off nerve pill makes her nerves feel worse - ok to resume low dose as needed  Her updated medication list for this problem includes:    Alprazolam 0.5 Mg Tabs (Alprazolam) .Marland Kitchen... Take 1 two times a day as needed    Amitriptyline Hcl 25 Mg Tabs (Amitriptyline hcl) .Marland Kitchen... 1 by mouth at bedtime  Complete Medication List: 1)  Levoxyl 200 Mcg Tabs (Levothyroxine sodium) .Marland KitchenMarland KitchenMarland Kitchen  Take 1 by mouth once daily 2)  Levoxyl 25 Mcg Tabs (Levothyroxine sodium) .... Take one half tablet in addition the the 200 micrograms tablet 3)  Humulin N 100 Unit/ml Susp (Insulin isophane human) .Marland Kitchen.. 10 units in the am and 10 units in the  pm 4)  Nitrolingual 0.4 Mg/spray Soln (Nitroglycerin) .... Inhale 1 spray as directed every two hours 5)  Metoprolol Succinate 50 Mg Xr24h-tab (Metoprolol succinate) .... Take 1/2 by mouth qd 6)  Aspirin 81 Mg Tbec (Aspirin) .... Take one tablet by mouth daily 7)  Vitamin E 1000 Unit Caps (Vitamin e) .... Take 1 tablet by mouth once a day 8)  Gabapentin 300 Mg Caps (Gabapentin) .... Take 1 by mouth once daily 9)  Alprazolam 0.5 Mg Tabs (Alprazolam) .... Take 1 two times a day as needed 10)  Vitamin D3 1000 Unit Tabs (Cholecalciferol) .... Take 1 cap daily 11)  Proventil Hfa 108 (90 Base) Mcg/act Aers (Albuterol sulfate) .... Use as needed 12)  Omeprazole 20 Mg Cpdr (Omeprazole) .... Take 1 by mouth qd 13)  Cyanocobalamin 1000 Mcg/ml Soln (Cyanocobalamin) .... Take 1 injection im q month 14)  Bd Luer-lok Syringe 23g X 1-1/4" 3 Ml Misc (Syringe/needle (disp)) .... Use 1 q month to dispense b12 15)  Actos 30 Mg Tabs (Pioglitazone hcl) .... Take 1 tablet by mouth once a day 16)  Benazepril Hcl 20 Mg Tabs (Benazepril hcl) .... Take 1 tablet by mouth once a day 17)  Hydrocodone-acetaminophen 5-500 Mg Tabs (Hydrocodone-acetaminophen) .... Take 1/2 -1 tab every 8 hours as needed for pain 18)  Detrol La 4 Mg Xr24h-cap (Tolterodine tartrate) ....  Take 1 cap daily 19)  Amitriptyline Hcl 25 Mg Tabs (Amitriptyline hcl) .Marland Kitchen.. 1 by mouth at bedtime 20)  Furosemide 40 Mg Tabs (Furosemide) .Marland Kitchen.. 1 by mouth daily as needed for swelling 21)  Klor-con M20 20 Meq Cr-tabs (Potassium chloride crys cr) .Marland Kitchen.. 1 by mouth once daily as needed with furosemide for swelling 22)  Nystatin 100000 Unit/gm Powd (Nystatin) .... Apply to affected skin three times a day as needed for yeast 23)  Amlodipine Besylate 5 Mg Tabs (Amlodipine besylate) .... Take one tablet by mouth daily 24)  Cipro 500 Mg Tabs (Ciprofloxacin hcl) .Marland Kitchen.. 1 by mouth two times a day x 5 days 25)  Pyridium 200 Mg Tabs (Phenazopyridine hcl) .Marland Kitchen.. 1 by mouth three times a day x 2 days for bladder pain  Patient Instructions: 1)  it was good to see you today. 2)  test(s) ordered today - your results will be called to you 3)  will call in antibioitcs (cipro) annd pyridum for bladder spasm; also will call in refills on your alprazolam (nerve pill) - use each as directed 4)  Please keep follow-up appointment as needed, sooner if problems.  Prescriptions: ALPRAZOLAM 0.5 MG TABS (ALPRAZOLAM) take 1 two times a day as needed  #60 x 3   Entered by:   Orlan Leavens   Authorized by:   Newt Lukes MD   Signed by:   Orlan Leavens on 12/30/2009   Method used:   Telephoned to ...       Hospital doctor (retail)       [address]       [place], [place]  [address]       Ph: [phone] or [phone]       Fax: [phone]   RxID:   [id number]  PYRIDIUM 200 MG TABS (PHENAZOPYRIDINE HCL) 1 by mouth three times a day x 2 days for bladder pain  #6 x 0   Entered by:   Orlan Leavens   Authorized by:   Newt Lukes MD   Signed by:   Orlan Leavens on 12/30/2009   Method used:   Faxed to ...       Hospital doctor (retail)       [address]       [place], [place]  [address]       Ph: [phone] or [phone]       Fax: [phone]    RxID:   [id number] CIPRO 500 MG TABS (CIPROFLOXACIN HCL) 1 by mouth two times a day x 5 days  #10 x 0   Entered by:   Orlan Leavens   Authorized by:   Newt Lukes MD   Signed by:   Orlan Leavens on 12/30/2009   Method used:   Faxed to ...       Hospital doctor (retail)       [address]       [place], [place]  [address]       Ph: [phone] or [phone]       Fax: [phone]   RxID:   [id number] ALPRAZOLAM 0.5 MG TABS (ALPRAZOLAM) take 1 two times a day as needed  #60 x 3   Entered and Authorized by:   Newt Lukes MD   Signed by:   Newt Lukes MD on 12/30/2009   Method used:   Historical   RxID:   [id number] PYRIDIUM 200 MG TABS (PHENAZOPYRIDINE HCL) 1 by mouth three times a day x 2 days for bladder pain  #6 x 0   Entered and Authorized by:   Newt Lukes MD   Signed by:   Newt Lukes MD on 12/30/2009   Method used:   Historical   RxID:   [id number] CIPRO 500 MG TABS (CIPROFLOXACIN HCL) 1 by mouth two times a day x 5 days  #10 x 0   Entered and Authorized by:   Newt Lukes MD   Signed by:   Newt Lukes MD on 12/30/2009   Method used:   Historical   RxID:   [id number]  Faxed over rx Cipro & Pyridium to [facility]. Contacted pharmacist Stew on her refill for alprazolam gave verbally. updated EMR.....12/30/09@12 :06pm

## 2010-07-13 NOTE — Letter (Signed)
Summary: [place] [facility] Work Engineer, agricultural at [employer]  [address] [place] [address]   Phone: [phone]  Fax: [phone]     June 26, 2010 MRN: [id number]   Elyce Cheadle [address] [place], [place]  [address]      YOUR LAB WORK IS DUE   Monday   July 03, 2010  Please go to [facility], located across the street from [facility] on the second floor.  Hours are Monday - Friday 7am until 7:30pm         Saturday 8am until 12noon    _x_  DO NOT EAT OR DRINK AFTER MIDNIGHT EVENING PRIOR TO LABWORK

## 2010-07-13 NOTE — Progress Notes (Signed)
Summary: Rx req  Phone Note Call from Patient Call back at Home Phone [phone]   Caller: Patient Summary of Call: Pt called requesting to re-start her potassium. She has been prescribed Furosemide since her hospitalization. Okay to send to pharmacy? Initial call taken by: Margaret Pyle, CMA,  December 15, 2009 10:27 AM  Follow-up for Phone Call        ok Follow-up by: Newt Lukes MD,  December 15, 2009 11:46 AM    New/Updated Medications: KLOR-CON M20 20 MEQ CR-TABS (POTASSIUM CHLORIDE CRYS CR) 1 by mouth once daily Prescriptions: KLOR-CON M20 20 MEQ CR-TABS (POTASSIUM CHLORIDE CRYS CR) 1 by mouth once daily  #30 x 5   Entered by:   Margaret Pyle, CMA   Authorized by:   Newt Lukes MD   Signed by:   Margaret Pyle, CMA on 12/15/2009   Method used:   Faxed to ...       Hospital doctor (retail)       [address]       [place], [place]  [address]       Ph: [phone] or [phone]       Fax: [phone]   RxID:   [id number]

## 2010-07-17 ENCOUNTER — Encounter: Payer: Self-pay | Admitting: Internal Medicine

## 2010-07-19 NOTE — Letter (Signed)
Summary: [place] Results Engineer, agricultural at [facility]  [address], [place] [address]   Phone: [phone]  Fax: [phone]      July 13, 2010 MRN: [id number]   [name] [name] [address] [place], [place]  [address]   Dear [name],  Your test ordered by Selena Batten has been reviewed by your physician (or physician assistant) and was found to be normal or stable. Your physician (or physician assistant) felt no changes were needed at this time.  ____ Echocardiogram  ____ Cardiac Stress Test  __x__ Lab Work  ____ Peripheral vascular study of arms, legs or neck  ____ CT scan or X-ray  ____ Lung or Breathing test  ____ Other:  Based on your recent labwork, please increase pravastatin to 80mg  daily. A new prescription was called in to your drugstore.  Labwork will be repeated in 6 weeks.  Enclosed is a copy of your labs for your records.  Thank you, [name] [name] RN    Milton Bing, MD, [facility]ise Arena.C.Gaylord Shih, MD, F.A.C.C Lewayne Bunting, MD, F.A.C.C Nona Dell, MD, F.A.C.C Charlton Haws, MD, Lenise Arena.C.C

## 2010-07-19 NOTE — Miscellaneous (Signed)
  Clinical Lists Changes  Medications: Changed medication from PRAVASTATIN SODIUM 40 MG TABS (PRAVASTATIN SODIUM) Take one tablet by mouth daily at bedtime to PRAVASTATIN SODIUM 80 MG TABS (PRAVASTATIN SODIUM) Take one tablet by mouth daily at bedtime - Signed Rx of PRAVASTATIN SODIUM 80 MG TABS (PRAVASTATIN SODIUM) Take one tablet by mouth daily at bedtime;  #30 x 3;  Signed;  Entered by: Teressa Lower RN;  Authorized by: Kathlen Brunswick, MD, [facility];  Method used: Faxed to [facility], [phone], [place], [place], [place]  [address], Ph: [phone] or [phone], Fax: [phone] Orders: Added new Test order of T-Lipid Profile [id number]) - Signed    Prescriptions: PRAVASTATIN SODIUM 80 MG TABS (PRAVASTATIN SODIUM) Take one tablet by mouth daily at bedtime  #30 x 3   Entered by:   Teressa Lower RN   Authorized by:   Kathlen Brunswick, MD, [facility]   Signed by:   Teressa Lower RN on 07/13/2010   Method used:   Faxed to ...       Hospital doctor (retail)       [address]       [place], [place]  [address]       Ph: [phone] or [phone]       Fax: [phone]   RxID:   [id number]

## 2010-07-19 NOTE — Letter (Signed)
Summary: [place] [facility] Work Engineer, agricultural at [employer]  [address], [place] [address]   Phone: [phone]  Fax: [phone]     July 13, 2010 MRN: [id number]   [name] [name] [address] [place], [place]  [address]      YOUR LAB WORK IS DUE   August 24, 2010  Please go to [facility], located across the street from [facility] on the second floor.  Hours are Monday - Friday 7am until 7:30pm         Saturday 8am until 12noon    _X_  DO NOT EAT OR DRINK AFTER MIDNIGHT EVENING PRIOR TO LABWORK

## 2010-07-20 ENCOUNTER — Other Ambulatory Visit: Payer: Self-pay | Admitting: Internal Medicine

## 2010-07-20 DIAGNOSIS — Z139 Encounter for screening, unspecified: Secondary | ICD-10-CM

## 2010-07-21 ENCOUNTER — Other Ambulatory Visit: Payer: Self-pay | Admitting: Internal Medicine

## 2010-07-21 ENCOUNTER — Ambulatory Visit (INDEPENDENT_AMBULATORY_CARE_PROVIDER_SITE_OTHER): Payer: MEDICARE | Admitting: Internal Medicine

## 2010-07-21 ENCOUNTER — Encounter: Payer: Self-pay | Admitting: Internal Medicine

## 2010-07-21 ENCOUNTER — Other Ambulatory Visit: Payer: MEDICARE

## 2010-07-21 DIAGNOSIS — E785 Hyperlipidemia, unspecified: Secondary | ICD-10-CM

## 2010-07-21 DIAGNOSIS — I10 Essential (primary) hypertension: Secondary | ICD-10-CM

## 2010-07-21 DIAGNOSIS — E039 Hypothyroidism, unspecified: Secondary | ICD-10-CM

## 2010-07-21 DIAGNOSIS — E119 Type 2 diabetes mellitus without complications: Secondary | ICD-10-CM

## 2010-07-21 DIAGNOSIS — N390 Urinary tract infection, site not specified: Secondary | ICD-10-CM

## 2010-07-21 LAB — URINALYSIS, ROUTINE W REFLEX MICROSCOPIC
Ketones, ur: NEGATIVE
Leukocytes, UA: NEGATIVE
Nitrite: NEGATIVE
Specific Gravity, Urine: <=1.005 (ref 1.000–1.030)
pH: 6.5 (ref 5.0–8.0)

## 2010-07-21 LAB — HEMOGLOBIN A1C: Hgb A1c MFr Bld: 7.8 % — ABNORMAL HIGH (ref 4.6–6.5)

## 2010-07-27 NOTE — Letter (Signed)
Summary: [facility]   Diabetic Supplies/[facility]   Imported By: Sherian Rein 07/20/2010 10:48:35  _____________________________________________________________________  External Attachment:    Type:   Image     Comment:   External Document

## 2010-07-27 NOTE — Assessment & Plan Note (Signed)
Summary: 3 MTH FU NWS   Vital Signs:  Patient profile:   77 year old female Height:      70 inches (177.80 cm) Weight:      263.0 pounds (119.55 kg) O2 Sat:      95 % on Room air Temp:     97.7 degrees F (36.50 degrees C) oral Pulse rate:   68 / minute BP sitting:   132 / 70  (left arm) Cuff size:   regular  Vitals Entered By: Orlan Leavens RMA (July 21, 2010 1:12 PM)  O2 Flow:  Room air CC: 3 month follow-up, Headache Is Patient Diabetic? Yes Did you bring your meter with you today? No Pain Assessment Patient in pain? no        Primary Care Provider:  Newt Lukes MD  CC:  3 month follow-up and Headache.  History of Present Illness: back pain and LLQ abd pain - located left groin has seen GI and gyn for same - no abn identified prior back and ortho eval during hosp summer 2011 but unable to f/u with local ortho due to $$ concerns s/p eval [facility]  HTN - reports compliance with ongoing medical treatment; recent changes in medication reviewed - prev off amlodipine in lotrel due to edema, not taking dilt b/c the opened cap casued her face to feel numb when she smelled it and ?if that med then causes her feet to be numb! pt brought pill bottles and all reviewed today - inc metoprolol since last OV  hypothyroid - reports compliance with ongoing medical treatment ;no changes in medication dose or frequency. denies adverse side effects related to current therapy except hard to split pill in half for combined dose = 212.5 mcg  DM2 - on insulin and oral meds -  reports compliance with ongoing medical treatment; interval changes in medication reviewed - off actos due to fear SE, on generic amaryl and insulin. denies adverse side effects related to current therapy. no hypoglycemia symptoms or events - checks cbg 2x/d before meals - 90-130 -  dyslipidemia - dose statin increased by cards 07/10/10 - skeptical about meds and fearful of SE but reports taking as rx'd w/o problem  at this time   Preventive Screening-Counseling & Management  Alcohol-Tobacco     Alcohol drinks/day: 0     Smoking Status: never  Clinical Review Panels:  Immunizations   Last Tetanus Booster:  Td (08/29/2009)   Last Flu Vaccine:  Fluvax 3+ (02/28/2010)   Last Pneumovax:  Historical (03/11/2009)  Lipid Management   Cholesterol:  219 (07/03/2010)   LDL (bad choesterol):  142 (07/03/2010)   HDL (good cholesterol):  45 (07/03/2010)   Triglycerides:  101 (12/11/2009)  Diabetes Management   HgBA1C:  7.4 (04/19/2010)   Creatinine:  0.80 (07/03/2010)   Last Flu Vaccine:  Fluvax 3+ (02/28/2010)   Last Pneumovax:  Historical (03/11/2009)  CBC   WBC:  7.0 (12/30/2009)   RBC:  4.35 (12/30/2009)   Hgb:  13.7 (12/30/2009)   Hct:  39.7 (12/30/2009)   Platelets:  237.0 (12/30/2009)   MCV  91.3 (12/30/2009)   MCHC  34.5 (12/30/2009)   RDW  13.2 (12/30/2009)   PMN:  62.6 (12/30/2009)   Lymphs:  30.0 (12/30/2009)   Monos:  5.9 (12/30/2009)   Eosinophils:  1.1 (12/30/2009)   Basophil:  0.4 (12/30/2009)  Complete Metabolic Panel   Glucose:  174 (07/03/2010)   Sodium:  138 (07/03/2010)   Potassium:  4.2 (07/03/2010)  Chloride:  99 (07/03/2010)   CO2:  29 (07/03/2010)   BUN:  12 (07/03/2010)   Creatinine:  0.80 (07/03/2010)   Albumin:  4.2 (07/03/2010)   Total Protein:  7.1 (10/11/2009)   Calcium:  9.1 (07/03/2010)   Total Bili:  0.5 (10/11/2009)   Alk Phos:  87 (10/11/2009)   SGPT (ALT):  27 (10/11/2009)   SGOT (AST):  36 (10/11/2009)   Current Medications (verified): 1)  Levoxyl 200 Mcg Tabs (Levothyroxine Sodium) .... Take 1 By Mouth Once Daily 2)  Levoxyl 25 Mcg Tabs (Levothyroxine Sodium) .... One Tablet Every Other Day (In Addition The The 200 Micrograms Tablet) 3)  Humulin N 100 Unit/ml Susp (Insulin Isophane Human) .Marland Kitchen.. 15 Units in The Am and 15 Units in The  Pm 4)  Nitrolingual 0.4 Mg/spray Soln (Nitroglycerin) .... Inhale 1 Spray As Directed Every Two Hours 5)   Metoprolol Succinate 50 Mg Xr24h-Tab (Metoprolol Succinate) .... Take 1 By Mouth Once Daily 6)  Aspirin 81 Mg Tbec (Aspirin) .... Take One Tablet By Mouth Daily 7)  Gabapentin 300 Mg Caps (Gabapentin) .... Take 1 By Mouth At Bedtime 8)  Alprazolam 0.5 Mg Tabs (Alprazolam) .... Take 1/2-1 Tab By Mouth Two Times A Day As Needed 9)  Vitamin D3 1000 Unit Tabs (Cholecalciferol) .... Take 1 Cap Daily 10)  Proventil Hfa 108 (90 Base) Mcg/act Aers (Albuterol Sulfate) .... Use As Needed 11)  Omeprazole 20 Mg Cpdr (Omeprazole) .... Take 1 By Mouth Qd 12)  Cyanocobalamin 1000 Mcg/ml Soln (Cyanocobalamin) .... Take 1 Injection Im Q Month 13)  Bd Luer-Lok Syringe 23g X 1-1/4" 3 Ml Misc (Syringe/needle (Disp)) .... Use 1 Q Month To Dispense B12 14)  Glimepiride 2 Mg Tabs (Glimepiride) .Marland Kitchen.. 1 By Mouth Every Am 15)  Benazepril Hcl 20 Mg Tabs (Benazepril Hcl) .... Take 1 Tablet By Mouth Once A Day 16)  Hydrocodone-Acetaminophen 5-500 Mg Tabs (Hydrocodone-Acetaminophen) .... Take 1/2 -1 Tab Every 8 Hours As Needed For Pain 17)  Vesicare 5 Mg Tabs (Solifenacin Succinate) .Marland Kitchen.. 1 By Mouth Once Daily 18)  Furosemide 40 Mg Tabs (Furosemide) .Marland Kitchen.. 1 By Mouth Daily As Needed For Swelling 19)  Klor-Con M20 20 Meq Cr-Tabs (Potassium Chloride Crys Cr) .Marland Kitchen.. 1 By Mouth Once Daily As Needed With Furosemide For Swelling 20)  Nystatin 100000 Unit/gm Powd (Nystatin) .... Apply To Affected Skin Three Times A Day As Needed For Yeast 21)  Vitamin E 400 Unit Caps (Vitamin E) .... Take 1 Tab Daily 22)  Amlodipine Besylate 10 Mg Tabs (Amlodipine Besylate) .... Take 1 Tablet By Mouth Once Daily 23)  Triamcinolone Acetonide 0.1 % Lotn (Triamcinolone Acetonide) .... Apply To Affected Skin Two Times A Day As Needed For Itch 24)  Mobic 7.5 Mg Tabs (Meloxicam) .Marland Kitchen.. 1 By Mouth Once Daily 25)  Pravastatin Sodium 80 Mg Tabs (Pravastatin Sodium) .... Take One Tablet By Mouth Daily At Bedtime  Allergies (verified): 1)  ! Penicillin  Past  History:  Past Medical History: ASCVD: Cath in 9/99-70% mid LAD with diffuse distal disease, 70% T1, 60% mid circumflex,       50% mid RCA, normal ejection fraction; negative stress nuclear in 07/2008  Cerebrovascular disease-carotid bruits; no focal disease in 1999, 2002 and 2006 Conduction system disease-dual-chamber pacemaker implant (Medtronic)-2003 Hx CHF with preserved LV systolic function  Hypertension   Hyperlipidemia  Diabetes, type II-insulin-dependent IRRITABLE BOWEL SYNDROME (ICD-564.1)-mixed      Colonoscopy 2008 non-specific colitis, IH, [place] Diverticulosis, RECTAL ULCER 2o to ASA  Dysphagia-BaSw 2009-nonspecific esophageal motility disorder DUODENAL DIVERTICULUM   GERD w/ HH, esophigitis   COPD OSTEOARTHRITIS - bilateral TKA HYPOTHYROIDISM  IMPINGEMENT SYNDROME, left shoulder  Physician roster: Cardiology: [facility]; [facility] - [facility]   Review of Systems  The patient denies fever, weight loss, chest pain, headaches, and severe indigestion/heartburn.    Physical Exam  General:  overweight-appearing.  alert, well-developed, well-nourished, and cooperative to examination. nontoxic - spouse at side Lungs:  normal respiratory effort, no intercostal retractions or use of accessory muscles; normal breath sounds bilaterally - no crackles and no wheezes.    Heart:  normal rate, regular rhythm, no murmur, and no rub. BLE with trace chronic edema. Psych:  simple but oriented X3, memory intact for recent and remote, normally interactive, good eye contact, not anxious appearing, mild depressed appearing, and not agitated.      Impression & Recommendations:  Problem # 1:  DIABETES MELLITUS, TYPE II, ON INSULIN (ICD-250.00)  Her updated medication list for this problem includes:    Humulin N 100 Unit/ml Susp (Insulin isophane human) .Marland KitchenMarland KitchenMarland KitchenMarland Kitchen 15 units in the am and 15 units in the  pm    Aspirin 81 Mg Tbec (Aspirin) .Marland Kitchen... Take one tablet by mouth daily     Glimepiride 2 Mg Tabs (Glimepiride) .Marland Kitchen... 1 by mouth every am    Benazepril Hcl 20 Mg Tabs (Benazepril hcl) .Marland Kitchen... Take 1 tablet by mouth once a day  Orders: TLB-A1C / Hgb A1C (Glycohemoglobin) (83036-A1C)  cont inc insulin dose since off actos 02/2010 - cont [facility]  home cbg reviewed  Labs Reviewed: Creat: 0.80 (07/03/2010)    Reviewed HgBA1c results: 7.4 (04/19/2010)  7.4 (12/11/2009)  Problem # 2:  HYPERLIPIDEMIA (ICD-272.4)  inc dose 07/10/10 by cards - cont to follow with cards for same Her updated medication list for this problem includes:    Pravastatin Sodium 80 Mg Tabs (Pravastatin sodium) .Marland Kitchen... Take one tablet by mouth daily at bedtime  Labs Reviewed: SGOT: 36 (10/11/2009)   SGPT: 27 (10/11/2009)  Lipid Goals: Chol Goal: 200 (02/28/2010)   HDL Goal: 40 (02/28/2010)   LDL Goal: 100 (02/28/2010)   TG Goal: 150 (02/28/2010)  Prior 10 Yr Risk Heart Disease: 17 % (02/28/2010)   HDL:45 (07/03/2010), 42 (12/11/2009)  LDL:142 (07/03/2010), 82 (21/30/8657)  Chol:219 (07/03/2010), 144 (12/11/2009)  Trig:161 (07/03/2010), 101 (12/11/2009)  Problem # 3:  HYPERTENSION (ICD-401.9)  Her updated medication list for this problem includes:    Metoprolol Succinate 50 Mg Xr24h-tab (Metoprolol succinate) .Marland Kitchen... Take 1 by mouth once daily    Benazepril Hcl 20 Mg Tabs (Benazepril hcl) .Marland Kitchen... Take 1 tablet by mouth once a day    Furosemide 40 Mg Tabs (Furosemide) .Marland Kitchen... 1 by mouth daily as needed for swelling    Amlodipine Besylate 10 Mg Tabs (Amlodipine besylate) .Marland Kitchen... Take 1 tablet by mouth once daily  BP today: 132/70 Prior BP: 126/72 (05/16/2010)  Prior 10 Yr Risk Heart Disease: 17 % (02/28/2010)  Labs Reviewed: K+: 4.2 (07/03/2010) Creat: : 0.80 (07/03/2010)   Chol: 219 (07/03/2010)   HDL: 45 (07/03/2010)   LDL: 142 (07/03/2010)   TG: 161 (07/03/2010)  Problem # 4:  HYPOTHYROIDISM (ICD-244.9)  Her updated medication list for this problem includes:    Levoxyl 200 Mcg Tabs  (Levothyroxine sodium) .Marland Kitchen... Take 1 by mouth once daily    Levoxyl 25 Mcg Tabs (Levothyroxine sodium) ..... One tablet every other day (in addition the the 200 micrograms tablet)  Orders: TLB-TSH (Thyroid Stimulating  Hormone) (84443-TSH)  Labs Reviewed: TSH: 1.945 (12/11/2009)    HgBA1c: 7.4 (04/19/2010) Chol: 219 (07/03/2010)   HDL: 45 (07/03/2010)   LDL: 142 (07/03/2010)   TG: 161 (07/03/2010)  Problem # 5:  UTI (ICD-599.0)  Her updated medication list for this problem includes:    Vesicare 5 Mg Tabs (Solifenacin succinate) .Marland Kitchen... 1 by mouth once daily  Orders: TLB-Udip w/ Micro (81001-URINE)  Encouraged to push clear liquids, get enough rest, and take acetaminophen as needed. To be seen in 10 days if no improvement, sooner if worse.  Complete Medication List: 1)  Levoxyl 200 Mcg Tabs (Levothyroxine sodium) .... Take 1 by mouth once daily 2)  Levoxyl 25 Mcg Tabs (Levothyroxine sodium) .... One tablet every other day (in addition the the 200 micrograms tablet) 3)  Humulin N 100 Unit/ml Susp (Insulin isophane human) .Marland Kitchen.. 15 units in the am and 15 units in the  pm 4)  Nitrolingual 0.4 Mg/spray Soln (Nitroglycerin) .... Inhale 1 spray as directed every two hours 5)  Metoprolol Succinate 50 Mg Xr24h-tab (Metoprolol succinate) .... Take 1 by mouth once daily 6)  Aspirin 81 Mg Tbec (Aspirin) .... Take one tablet by mouth daily 7)  Gabapentin 300 Mg Caps (Gabapentin) .... Take 1 by mouth at bedtime 8)  Alprazolam 0.5 Mg Tabs (Alprazolam) .... Take 1/2-1 tab by mouth two times a day as needed 9)  Vitamin D3 1000 Unit Tabs (Cholecalciferol) .... Take 1 cap daily 10)  Proventil Hfa 108 (90 Base) Mcg/act Aers (Albuterol sulfate) .... Use as needed 11)  Omeprazole 20 Mg Cpdr (Omeprazole) .... Take 1 by mouth qd 12)  Cyanocobalamin 1000 Mcg/ml Soln (Cyanocobalamin) .... Take 1 injection im q month 13)  Bd Luer-lok Syringe 23g X 1-1/4" 3 Ml Misc (Syringe/needle (disp)) .... Use 1 q month to  dispense b12 14)  Glimepiride 2 Mg Tabs (Glimepiride) .Marland Kitchen.. 1 by mouth every am 15)  Benazepril Hcl 20 Mg Tabs (Benazepril hcl) .... Take 1 tablet by mouth once a day 16)  Hydrocodone-acetaminophen 5-500 Mg Tabs (Hydrocodone-acetaminophen) .... Take 1/2 -1 tab every 8 hours as needed for pain 17)  Vesicare 5 Mg Tabs (Solifenacin succinate) .Marland Kitchen.. 1 by mouth once daily 18)  Furosemide 40 Mg Tabs (Furosemide) .Marland Kitchen.. 1 by mouth daily as needed for swelling 19)  Klor-con M20 20 Meq Cr-tabs (Potassium chloride crys cr) .Marland Kitchen.. 1 by mouth once daily as needed with furosemide for swelling 20)  Nystatin 100000 Unit/gm Powd (Nystatin) .... Apply to affected skin three times a day as needed for yeast 21)  Vitamin E 400 Unit Caps (Vitamin e) .... Take 1 tab daily 22)  Amlodipine Besylate 10 Mg Tabs (Amlodipine besylate) .... Take 1 tablet by mouth once daily 23)  Triamcinolone Acetonide 0.1 % Lotn (Triamcinolone acetonide) .... Apply to affected skin two times a day as needed for itch 24)  Meloxicam 15 Mg Tabs (Meloxicam) .Marland Kitchen.. 1 by mouth two times a day (per dr. Maureen Ralphs in [place]) 25)  Pravastatin Sodium 80 Mg Tabs (Pravastatin sodium) .... Take one tablet by mouth daily at bedtime  Patient Instructions: 1)  it was good to see you today. 2)  test(s) ordered today - your results will be called to you after-review in 48-72 hours from the time of test completion; call [phone] and enter your 9 digit MRN (listed above on this page, just below your name); if any changes need to be made or there are abnormal results, you will be contacted directly.  3)  no medication changes recommended 4)  Please schedule a follow-up appointment in 3 months for diabetes and blood pressure check, sooner if problems.    Orders Added: 1)  Est. Patient Level IV [16109] 2)  TLB-TSH (Thyroid Stimulating Hormone) [84443-TSH] 3)  TLB-A1C / Hgb A1C (Glycohemoglobin) [83036-A1C] 4)  TLB-Udip w/ Micro [81001-URINE]

## 2010-07-31 ENCOUNTER — Ambulatory Visit (HOSPITAL_COMMUNITY): Payer: MEDICARE

## 2010-08-04 ENCOUNTER — Ambulatory Visit (HOSPITAL_COMMUNITY)
Admission: RE | Admit: 2010-08-04 | Discharge: 2010-08-04 | Disposition: A | Discharge status: Home / Self Care | Payer: Medicare Other | Source: Ambulatory Visit | Attending: Internal Medicine | Admitting: Internal Medicine

## 2010-08-04 DIAGNOSIS — Z1231 Encounter for screening mammogram for malignant neoplasm of breast: Secondary | ICD-10-CM | POA: Insufficient documentation

## 2010-08-04 DIAGNOSIS — Z139 Encounter for screening, unspecified: Secondary | ICD-10-CM

## 2010-08-21 ENCOUNTER — Encounter (INDEPENDENT_AMBULATORY_CARE_PROVIDER_SITE_OTHER): Payer: Medicare Other

## 2010-08-21 ENCOUNTER — Encounter: Payer: Self-pay | Admitting: Internal Medicine

## 2010-08-21 DIAGNOSIS — I498 Other specified cardiac arrhythmias: Secondary | ICD-10-CM

## 2010-08-23 ENCOUNTER — Other Ambulatory Visit: Payer: Self-pay | Admitting: Cardiology

## 2010-08-23 DIAGNOSIS — I2510 Atherosclerotic heart disease of native coronary artery without angina pectoris: Secondary | ICD-10-CM

## 2010-08-25 ENCOUNTER — Ambulatory Visit (HOSPITAL_COMMUNITY)
Admission: RE | Admit: 2010-08-25 | Discharge: 2010-08-25 | Disposition: A | Discharge status: Home / Self Care | Payer: Medicare Other | Source: Ambulatory Visit | Attending: Cardiology | Admitting: Cardiology

## 2010-08-25 DIAGNOSIS — R0989 Other specified symptoms and signs involving the circulatory and respiratory systems: Secondary | ICD-10-CM | POA: Insufficient documentation

## 2010-08-25 DIAGNOSIS — I6529 Occlusion and stenosis of unspecified carotid artery: Secondary | ICD-10-CM | POA: Insufficient documentation

## 2010-08-25 DIAGNOSIS — I10 Essential (primary) hypertension: Secondary | ICD-10-CM | POA: Insufficient documentation

## 2010-08-25 DIAGNOSIS — I2510 Atherosclerotic heart disease of native coronary artery without angina pectoris: Secondary | ICD-10-CM

## 2010-08-25 DIAGNOSIS — E119 Type 2 diabetes mellitus without complications: Secondary | ICD-10-CM | POA: Insufficient documentation

## 2010-08-25 LAB — CONVERTED CEMR LAB
LDL Cholesterol: 87 mg/dL (ref 0–99)
VLDL: 23 mg/dL (ref 0–40)

## 2010-08-26 LAB — CBC
HCT: 41 % (ref 36.0–46.0)
Hemoglobin: 13.9 g/dL (ref 12.0–15.0)
MCH: 31.4 pg (ref 26.0–34.0)
MCHC: 33.8 g/dL (ref 30.0–36.0)

## 2010-08-26 LAB — BASIC METABOLIC PANEL
CO2: 25 mEq/L (ref 19–32)
Glucose, Bld: 111 mg/dL — ABNORMAL HIGH (ref 70–99)
Potassium: 4.1 mEq/L (ref 3.5–5.1)
Sodium: 135 mEq/L (ref 135–145)

## 2010-08-26 LAB — URINALYSIS, ROUTINE W REFLEX MICROSCOPIC
Bilirubin Urine: NEGATIVE
Glucose, UA: NEGATIVE mg/dL
Hgb urine dipstick: NEGATIVE
Specific Gravity, Urine: 1.007 (ref 1.005–1.030)
Urobilinogen, UA: 0.2 mg/dL (ref 0.0–1.0)

## 2010-08-26 LAB — DIFFERENTIAL
Basophils Relative: 1 % (ref 0–1)
Eosinophils Absolute: 0.1 10*3/uL (ref 0.0–0.7)
Lymphs Abs: 2.8 10*3/uL (ref 0.7–4.0)
Monocytes Absolute: 0.6 10*3/uL (ref 0.1–1.0)
Monocytes Relative: 9 % (ref 3–12)
Neutro Abs: 3.5 10*3/uL (ref 1.7–7.7)

## 2010-08-26 LAB — POCT CARDIAC MARKERS
Myoglobin, poc: 141 ng/mL (ref 12–200)
Troponin i, poc: <0.05 ng/mL (ref 0.00–0.09)

## 2010-08-27 LAB — LIPID PANEL
Cholesterol: 144 mg/dL (ref 0–200)
LDL Cholesterol: 82 mg/dL (ref 0–99)
VLDL: 20 mg/dL (ref 0–40)

## 2010-08-27 LAB — URINALYSIS, ROUTINE W REFLEX MICROSCOPIC
Bilirubin Urine: NEGATIVE
Hgb urine dipstick: NEGATIVE
Nitrite: NEGATIVE
Protein, ur: NEGATIVE mg/dL
Urobilinogen, UA: 0.2 mg/dL (ref 0.0–1.0)

## 2010-08-27 LAB — DIFFERENTIAL
Basophils Absolute: 0 10*3/uL (ref 0.0–0.1)
Basophils Absolute: 0 10*3/uL (ref 0.0–0.1)
Basophils Relative: 0 % (ref 0–1)
Basophils Relative: 0 % (ref 0–1)
Eosinophils Absolute: 0.1 10*3/uL (ref 0.0–0.7)
Eosinophils Absolute: 0.1 10*3/uL (ref 0.0–0.7)
Eosinophils Relative: 1 % (ref 0–5)
Monocytes Absolute: 0.4 10*3/uL (ref 0.1–1.0)
Monocytes Absolute: 0.4 10*3/uL (ref 0.1–1.0)
Monocytes Relative: 7 % (ref 3–12)
Monocytes Relative: 7 % (ref 3–12)
Neutro Abs: 2.7 10*3/uL (ref 1.7–7.7)
Neutrophils Relative %: 50 % (ref 43–77)

## 2010-08-27 LAB — GLUCOSE, CAPILLARY
Glucose-Capillary: 132 mg/dL — ABNORMAL HIGH (ref 70–99)
Glucose-Capillary: 163 mg/dL — ABNORMAL HIGH (ref 70–99)
Glucose-Capillary: 179 mg/dL — ABNORMAL HIGH (ref 70–99)

## 2010-08-27 LAB — COMPREHENSIVE METABOLIC PANEL
ALT: 27 U/L (ref 0–35)
AST: 33 U/L (ref 0–37)
Albumin: 3.8 g/dL (ref 3.5–5.2)
Alkaline Phosphatase: 62 U/L (ref 39–117)
BUN: 12 mg/dL (ref 6–23)
Chloride: 106 mEq/L (ref 96–112)
GFR calc Af Amer: >60 mL/min (ref >60)
Potassium: 3.7 mEq/L (ref 3.5–5.1)
Sodium: 137 mEq/L (ref 135–145)
Total Bilirubin: 0.9 mg/dL (ref 0.3–1.2)
Total Protein: 7 g/dL (ref 6.0–8.3)

## 2010-08-27 LAB — CBC
HCT: 37.3 % (ref 36.0–46.0)
HCT: 41.9 % (ref 36.0–46.0)
MCHC: 34.1 g/dL (ref 30.0–36.0)
MCHC: 34.6 g/dL (ref 30.0–36.0)
MCV: 90.1 fL (ref 78.0–100.0)
MCV: 92.1 fL (ref 78.0–100.0)
Platelets: 173 10*3/uL (ref 150–400)
Platelets: 194 10*3/uL (ref 150–400)
Platelets: 197 10*3/uL (ref 150–400)
RBC: 4.24 MIL/uL (ref 3.87–5.11)
RDW: 13 % (ref 11.5–15.5)
RDW: 13.1 % (ref 11.5–15.5)
RDW: 13.2 % (ref 11.5–15.5)
WBC: 4.4 10*3/uL (ref 4.0–10.5)
WBC: 5.4 10*3/uL (ref 4.0–10.5)
WBC: 5.4 10*3/uL (ref 4.0–10.5)

## 2010-08-27 LAB — BASIC METABOLIC PANEL
BUN: 11 mg/dL (ref 6–23)
BUN: 8 mg/dL (ref 6–23)
Calcium: 9.4 mg/dL (ref 8.4–10.5)
Creatinine, Ser: 0.74 mg/dL (ref 0.4–1.2)
Creatinine, Ser: 0.76 mg/dL (ref 0.4–1.2)
GFR calc non Af Amer: >60 mL/min (ref >60)
GFR calc non Af Amer: >60 mL/min (ref >60)
Glucose, Bld: 117 mg/dL — ABNORMAL HIGH (ref 70–99)
Glucose, Bld: 136 mg/dL — ABNORMAL HIGH (ref 70–99)
Potassium: 4.4 mEq/L (ref 3.5–5.1)

## 2010-08-27 LAB — HEMOGLOBIN A1C
Hgb A1c MFr Bld: 7.4 % — ABNORMAL HIGH (ref <5.7)
Mean Plasma Glucose: 166 mg/dL — ABNORMAL HIGH (ref <117)

## 2010-08-27 LAB — SEDIMENTATION RATE: Sed Rate: 13 mm/hr (ref 0–22)

## 2010-08-27 LAB — ANA: Anti Nuclear Antibody(ANA): NEGATIVE

## 2010-08-27 LAB — URINE CULTURE

## 2010-08-29 ENCOUNTER — Telehealth: Payer: Self-pay | Admitting: Cardiology

## 2010-08-29 ENCOUNTER — Other Ambulatory Visit: Payer: Self-pay | Admitting: *Deleted

## 2010-08-29 DIAGNOSIS — F419 Anxiety disorder, unspecified: Secondary | ICD-10-CM

## 2010-08-29 MED ORDER — ALPRAZOLAM 0.5 MG PO TABS: 0.5000 mg | ORAL_TABLET | Freq: Two times a day (BID) | ORAL | Status: DC

## 2010-08-29 NOTE — Telephone Encounter (Signed)
Patient would like results of labwork and other tests / tg

## 2010-08-29 NOTE — Telephone Encounter (Addendum)
Yes (ok to fill) Printed and signed rx - given to lucy to fax

## 2010-08-29 NOTE — Telephone Encounter (Signed)
Would like results of labwork and other tests/tg

## 2010-08-29 NOTE — Procedures (Signed)
Summary: pacer check/tg   Allergies (verified): 1)  ! Penicillin   PPM Specifications Following MD:  Lewayne Bunting, MD     PPM Vendor:  Medtronic     PPM Model Number:  [id number]     PPM Serial Number:  [id number] PPM DOI:  12/24/2001     PPM Implanting MD:  Lewayne Bunting, MD  Lead 1    Location: [facility]     DOI: 12/24/2001     Model #: [id number]     Serial #: [id number]     Status: active Lead 2    Location: RV     DOI: 12/24/2001     Model #: [id number]     Serial #: [id number]     Status: active  Magnet Response Rate:  BOL 85 ERI 65  Indications:  Pre-syncope; 2nd AVB   PPM Follow Up Pacer Dependent:  Yes      Episodes Coumadin:  No  Parameters Mode:  DDDR     Lower Rate Limit:  60     Upper Rate Limit:  130 Paced AV Delay:  250     Sensed AV Delay:  250 Tech Comments:  See PaceArt

## 2010-08-29 NOTE — Telephone Encounter (Signed)
Faxed script to [facility] pharmacy

## 2010-08-29 NOTE — Telephone Encounter (Signed)
Faxed script to [facility] @ [phone]....4:49pm/LMB

## 2010-08-29 NOTE — Telephone Encounter (Signed)
Addended by: Orlan Leavens on: 08/29/2010 03:07 PM   Modules accepted: Orders

## 2010-08-29 NOTE — Cardiovascular Report (Signed)
Summary: Office Visit   Office Visit   Imported By: Roderic Ovens 08/25/2010 15:32:47  _____________________________________________________________________  External Attachment:    Type:   Image     Comment:   External Document

## 2010-08-29 NOTE — Telephone Encounter (Signed)
Addended by: Rene Paci ANN on: 08/29/2010 03:34 PM   Modules accepted: Orders

## 2010-09-06 ENCOUNTER — Telehealth: Payer: Self-pay

## 2010-09-06 NOTE — Telephone Encounter (Signed)
Pt called stating she has been experiencing an increase in her blood sugar in the last 2 weeks. Pt states she had contracted a stomach virus from her husband just previous to increased CBG's. Pt says her averages have been 150 in the morning and 190 in the afternoon (average usually 110's). Pt is requesting adjustment in insulin, an increase of 5 units bid until blood sugars normalize.

## 2010-09-06 NOTE — Telephone Encounter (Signed)
Pt advised of same and will call back as needed if adjustment does not help

## 2010-09-06 NOTE — Telephone Encounter (Signed)
Yes - ok to self adjust as requested until cbgs controlled - schedule OV if problems or continued poor control - thanks

## 2010-09-18 ENCOUNTER — Other Ambulatory Visit: Payer: Self-pay | Admitting: Internal Medicine

## 2010-09-18 NOTE — Telephone Encounter (Signed)
Faxed script back to [facility]...09/18/10@4 ;20pm/LMB

## 2010-09-18 NOTE — Telephone Encounter (Signed)
Ok to refill - prescription printed and signed - placed on lucy's desk  

## 2010-09-19 ENCOUNTER — Encounter: Payer: Self-pay | Admitting: Internal Medicine

## 2010-09-19 LAB — SEDIMENTATION RATE: Sed Rate: 18 mm/hr (ref 0–22)

## 2010-09-20 ENCOUNTER — Encounter: Payer: Self-pay | Admitting: Internal Medicine

## 2010-09-20 ENCOUNTER — Ambulatory Visit (INDEPENDENT_AMBULATORY_CARE_PROVIDER_SITE_OTHER): Payer: Medicare Other | Admitting: Internal Medicine

## 2010-09-20 VITALS — BP 120/64 | HR 83 | Temp 97.5°F | Ht 70.0 in | Wt 259.4 lb

## 2010-09-20 DIAGNOSIS — N390 Urinary tract infection, site not specified: Secondary | ICD-10-CM

## 2010-09-20 DIAGNOSIS — R1032 Left lower quadrant pain: Secondary | ICD-10-CM

## 2010-09-20 DIAGNOSIS — R350 Frequency of micturition: Secondary | ICD-10-CM

## 2010-09-20 LAB — POCT URINALYSIS DIPSTICK
Leukocytes, UA: NEGATIVE
Nitrite, UA: NEGATIVE
Protein, UA: NEGATIVE
Spec Grav, UA: 1.002
Urobilinogen, UA: 0.2

## 2010-09-20 MED ORDER — CIPROFLOXACIN HCL 500 MG PO TABS: 500.0000 mg | ORAL_TABLET | Freq: Two times a day (BID) | ORAL | Status: AC

## 2010-09-20 NOTE — Assessment & Plan Note (Signed)
Suspect today's symptoms related to UTI but complicated by chronic IBS, L hip OA and lumbar DDD Afebrile, no rebound - tx emperically for UTI and call if symptoms worse or unimproved - pt agrees to same

## 2010-09-20 NOTE — Assessment & Plan Note (Signed)
UA not conclusive but history and exam strongly suggestive of same - Send for Ucx and start antibiotics - erx done

## 2010-09-20 NOTE — Progress Notes (Signed)
Subjective:    Patient ID: [name] [name], female    DOB: 07/16/1932, 77 y.o.   MRN: [id number]  HPI  complains of  LLQ abd pain - located left groin Onset 5 days ago - progressive fatigue due to same. Similar to prior UTI as pain associated with increase urinary freq and dysuria Denies hematuria, flank pain or fever.  +hx same - has previously seen GI and gyn for same - no abn identified prior back and ortho eval during hosp summer 2011 - status post ortho eval in [place] 07/2010 who has referred the patient to back specialist  Also reviewed chronic medical issues: HTN - reports compliance with ongoing medical treatment; recent changes in medication reviewed - prev off amlodipine in lotrel due to edema - then resumed due to intolerance of dilt - the patient reports compliance with medication(s) as prescribed. Denies adverse side effects.  hypothyroid - reports compliance with ongoing medical treatment ;no changes in medication dose or frequency. denies adverse side effects related to current therapy except hard to split pill in half for combined dose = 212.5 mcg  DM2 - on insulin and oral meds - reports compliance with ongoing medical treatment; interval changes in medication reviewed - off actos due to fear SE, on generic amaryl and insulin. denies adverse side effects related to current therapy. no hypoglycemia symptoms or events - checks cbg 2x/d before meals - 90-130, higher with recent UTI symptoms   dyslipidemia - dose statin increased by cards 07/10/10 - pt remains skeptical about meds and fearful of SE but reports taking as rx'd w/o problem at this time  Past Medical History  Diagnosis Date  . VITAMIN B12 DEFICIENCY 10/11/2009  . Obesity, unspecified 09/16/2009  . Sinoatrial node dysfunction 12/07/2009  . Chronic diastolic heart failure 12/15/2008  . IMPINGEMENT SYNDROME 07/01/2007    L shoulder  . COLONIC POLYPS, ADENOMATOUS, HX OF 02/22/2009  . HELICOBACTER PYLORI GASTRITIS, HX OF 02/17/2009    . Cardiac pacemaker in situ 12/15/2008  . Irritable bowel syndrome (IBS)   . ANXIETY   . CHF (congestive heart failure)   . COPD   . HYPERLIPIDEMIA   . HYPERTENSION   . HYPOTHYROIDISM   . Carpal tunnel syndrome 12/26/2009  . PERIPHERAL NEUROPATHY   . GERD     with HH  . OSTEOARTHRITIS, KNEES, BILATERAL     B TKR    Review of Systems  Cardiovascular: Negative for chest pain.  Gastrointestinal: Positive for nausea, abdominal pain and constipation. Negative for vomiting and blood in stool.  Genitourinary: Positive for dysuria and urgency. Negative for difficulty urinating.  Neurological: Negative for headaches.       Objective:   Physical Exam  Constitutional: She is oriented to person, place, and time. She appears well-developed and well-nourished. She appears distressed.       Obese.  Cardiovascular: Normal rate, regular rhythm and normal heart sounds.   No murmur heard. Pulmonary/Chest: Effort normal and breath sounds normal. No respiratory distress. She has no wheezes.  Abdominal: Soft. Bowel sounds are normal. She exhibits no distension and no mass. There is tenderness (LLQ and suprapubic region). There is no rebound and no guarding.  Neurological: She is alert and oriented to person, place, and time.       Lab Results  Component Value Date   WBC 7.0 12/30/2009   HGB NEGATIVE 03/17/2010   HCT 39.7 12/30/2009   PLT 237.0 12/30/2009   CHOL 158 08/25/2010   TRIG 115 08/25/2010  HDL 48 08/25/2010   ALT 27 1/61/0960   AST 33 12/18/2009   NA 138 07/03/2010   K 4.2 07/03/2010   CL 99 07/03/2010   CREATININE 0.80 07/03/2010   BUN 12 07/03/2010   CO2 29 07/03/2010   TSH 8.96* 07/21/2010   HGBA1C 7.8* 07/21/2010   MICROALBUR 2.7* 08/29/2009    Assessment & Plan:  See problem list. Medications and labs reviewed today.

## 2010-09-20 NOTE — Patient Instructions (Signed)
It was good to see you today. Use Cipro antibiotics for your bladder infection to help with the pain - Your prescription(s) have been submitted to your pharmacy. Please take as directed and contact our office if you believe you are having problem(s) with the medication(s). Other medications reviewed, no changes at this time. Please keep scheduled followup as before, call sooner if problems.

## 2010-10-03 ENCOUNTER — Other Ambulatory Visit: Payer: Self-pay | Admitting: Internal Medicine

## 2010-10-20 DIAGNOSIS — E039 Hypothyroidism, unspecified: Secondary | ICD-10-CM

## 2010-10-20 DIAGNOSIS — E119 Type 2 diabetes mellitus without complications: Secondary | ICD-10-CM

## 2010-10-24 NOTE — Assessment & Plan Note (Signed)
NAMEMEMORI, SAMMON                  CHART#:  [id number]   DATE:  10/03/2007                       DOB:  02-10-1933   CHIEF COMPLAINT:  Follow-up H. pylori gastritis/Candida  esophagitis/chronic GERD/IBS.   SUBJECTIVE:  [name] [name] is a 77 year old female.  She has history of H.  Pylori gastritis as well as Candida esophagitis.  She has history of  intermittent diarrhea which alternates with constipation and felt to  have IBS.  She has been doing very well recently.  She is on Prevacid 30  mg daily.  She denies any breakthrough heartburn, indigestion.  She  denies any anorexia.  She has had some fatigue and has had her Levoxyl  adjusted recently by her primary care physician.  She denies any  dysphagia, odynophagia, denies any diarrhea or constipation at this time  she has generally been having soft brown bowel movement.  No known  rectal bleeding or melena.  Her weight is up 11 pounds since she was  seen 3 months ago by Dr. Cira Servant.  Her blood sugars have been well  controlled per her report.  Her colonoscopy showed focal active colitis  in December 2008, but no architectural distortion.  She did have an  esophagram on July 04, 2007, a barium pill esophagram which showed  penetration, esophageal dysmotility and small to moderate hiatal hernia.   CURRENT MEDICATIONS:  See the list from October 03, 2007.   ALLERGIES:  PENICILLIN.   OBJECTIVE:  VITAL SIGNS:  Weight 274, height 69 inches, temperature  97.7, blood pressure 124/72 and pulse 64.  GENERAL:  [name] [name] is an obese Caucasian female who is alert,  oriented, pleasant, cooperative, no acute distress.  HEENT.  Sclerae are clear, nonicteric.  Conjunctivae pink.  Oropharynx  pink and moist without lesions.  CHEST:  Heart regular rate and rhythm.  Normal S1-S2.  ABDOMEN:  Positive bowel sounds x4.  No bruits auscultated.  Soft,  nontender, nondistended without palpable mass or hepatosplenomegaly.  No  rebound tenderness or  guarding.  Trace lower extremity pretibial edema  bilaterally.   ASSESSMENT:  [name] [name] is a 77 year old female with history of  irritable bowel syndrome and chronic gastroesophageal reflux disease,  well controlled.  Status post treatment for H.  Pylori gastritis and  candida esophagitis.  She has history of adenomatous colonic polyps.  Last colonoscopy in 2008.  She had nonspecific colitis and no further  diarrhea at this point.  Nonspecific esophageal motility disorder.   PLAN:  1. She should continue Prevacid 30 mg daily.  2. Colonoscopy in December 2013.  3. Follow up in one year with Dr. Cira Servant or sooner if needed.       [name] [name], N.P.  Electronically Signed     R. Roetta Sessions, M.D.  Electronically Signed    KJ/MEDQ  D:  10/03/2007  T:  10/03/2007  Job:  [id number]   cc:   Tesfaye D. Felecia Shelling, MD  Gerrit Friends. Dietrich Pates, MD, [facility]

## 2010-10-24 NOTE — Letter (Signed)
July 08, 2007    Tesfaye D. Felecia Shelling, MD  [address]  [place], [place] [address]   RE:  [name] [name], [name] [name]  MRN:  [id number]  /  DOB:  1932/10/04   Dear Dr. Felecia Shelling:   [name] returns to the office after a long hiatus.  Unfortunately,  we lost track of her.  Fortunately, she has done very well.  She reports  no chest pain or dyspnea.  She completes all of her usual activities,  which are relatively sedentary, without difficulty.  Her pacemaker was  recently assessed and is functioning normally, now 5 years following  implantation.  She denies all cardiopulmonary symptoms.  She has been  followed by Dr. Cira Servant for GI issues.   Current medications include Xanax 0.5 mg daily, Prevacid 30 mg daily,  levothyroxine 0.225 mg daily, KCl 40 mEq daily, Lotrel 05/20 mg daily,  Actos 45 mg daily, metoprolol 50 mg daily, furosemide 40 mg b.i.d.,  Allegra 180 mg daily, Neurontin 300 mg daily, Humulin per your  directions, fluvastatin 80 mg daily.   On exam, overweight pleasant woman in no acute distress.  The weight is  256, 13 pounds more than at her last visit with me in September 2006 but  15 pounds less than in September of this year.  Blood pressure 120/60,  heart rate 75 and somewhat irregular, respirations 16.  NECK:  No  jugular venous distention; faint right carotid bruit.  LUNGS:  Clear.  CARDIAC:  Normal first and second heart sounds.  ABDOMEN:  Soft and nontender; no organomegaly.  EXTREMITIES:  No edema; distal pulses intact.   Most recent laboratories are from September and was quite good, with a  normal CBC, normal chemistry profile, hemoglobin A1c of 6.6 and  borderline hyperthyroidism with a TSH of 0.5 and a thyroxine of 13.5.   IMPRESSION:  [name] is doing superbly from a cardiac standpoint.  I  have taken the liberty of reducing levothyroxine to 0.2 mg per day with  a repeat chemistry profile and thyroid function tests pending.  Hypertension is well-controlled.   Her conduction system disease is doing  fine with pacing.  She should not require generator change for some  time.  Hyperlipidemia is under optimal control.  I will plan to see this  nice woman again in 1 year.    Sincerely,      Gerrit Friends. Dietrich Pates, MD, [facility]  Electronically Signed    RMR/MedQ  DD: 07/08/2007  DT: 07/08/2007  Job #: [id number]

## 2010-10-24 NOTE — Letter (Signed)
March 11, 2007    Gerrit Friends. Dietrich Pates, MD, [facility]  [address]  [place], [place] [address]   RE:  [name] [name], [name] [name]  MRN:  [id number]  /  DOB:  January 20, 1933   Dear Cleone Slim,   [name] [name] came in today, she has had no recurrent syncope.   She does have some exertional chest discomfort which she describes as  relatively stable.   She also requests Xanax which I have asked her to defer to who  prescribed initially.   MEDICATIONS:  1. Actos 45.  2. Lotrel 5/20.  3. Toprol 50.  4. Insulin.  5. Furosemide 40.  6. Prevacid.  7. Levoxyl 225.  8. Lescol 80.  9. Aspirin.   EXAMINATION:  Her blood pressure was quite elevated at 173/89 with a  pulse of 80.  I took it again prior to her leaving, it was still 165.  LUNGS:  Clear.  HEART:  Sounds were regular.  SKIN:  Warm and dry.  EXTREMITIES:  Were without edema.   Interrogation of her Medtronic Kappa pulse generator demonstrates a P  wave of 4 with an impendence of 454, threshold of 0.5 at 0.4, THERE WAS  NO INTRINSIC VENTRICULAR RHYTHM.  The RV impendence was 646 with  thresholds of 0.625 at 0.4, battery voltage 2.74.   IMPRESSION:  1. Progressive conduction system disease now with complete heart      block.  2. Syncope secondary to #1.  3. Status post pacer for #1 and #2.  4. Ischemic heart disease with ongoing stable exertional angina.  5. Hypertension.  6. Diabetes.   [name] [name], Cleone Slim is doing pretty well.  I was concerned about the level  of her blood pressure as well as her ongoing exertional angina.  I have  increased her Toprol from 50 to 100 mg a day.   I have asked that she follow up with you and Dr. Felecia Shelling for further  control of her blood pressure especially given her underlying diabetes.  Hopefully also the beta blocker will augment her anti-anginals;  increasing Lotrel was another option but I thought we would try one  thing at a time.   We will see her again in 1 year's time in the [facility].     Sincerely,      Duke Salvia, MD, [facility]  Electronically Signed    SCK/MedQ  DD: 03/11/2007  DT: 03/11/2007  Job #: [id number]

## 2010-10-24 NOTE — Op Note (Signed)
NAME:  [name], [name]                 ACCOUNT NO.:  [id number]   MEDICAL RECORD NO.:  [id number]          PATIENT TYPE:  AMB   LOCATION:  DAY                           FACILITY:  [facility]   PHYSICIAN:  Kassie Mends, M.D.      DATE OF BIRTH:  September 08, 1932   DATE OF PROCEDURE:  05/26/2007  DATE OF DISCHARGE:                               OPERATIVE REPORT   PROCEDURE:  1. Colonoscopy with cold forceps biopsies.  2. Esophagogastroduodenoscopy with cold forceps biopsy and brush      biopsies.   INDICATION FOR EXAM:  [name] [name] is a 77 year old morbidly obese lady  with a history of chronic constipation.  The patient presented as an  outpatient with 3 months of diarrhea.  She had been on antibiotics.  She  has a history of hypothyroidism.  She is also complaining of solid  dysphagia.  Sometimes she has to drink water just to get meat to go  through.  She has a history of diabetes.   FINDINGS:  1. 3 mm x 6-8 mm shallow ulcer seen in the ascending colon just      outside of the ileocecal valve.  Biopsies obtained via cold      forceps.  2. Rare sigmoid diverticula.  Otherwise no polyps, masses or      arteriovenous malformations seen.  3. Patchy erythema in the rectum without ulceration.  Biopsies      obtained via cold forceps.  4. Small internal hemorrhoids.  5. Small white plaques seen in the proximal to mid esophagus which did      not rinse off.  Brush biopsies obtained to evaluate for Candida      esophagitis.  6. 2 to 3 cm hiatal hernia.  7. Patchy erythema in the antrum without erosion or ulceration.      Biopsies obtained via cold forceps to evaluate for H. pylori      gastritis.  8. Single large mouth diverticulum in the proximal portion of D2,      likely involving the ampulla.  Otherwise normal duodenal bulb and      second portion of the duodenum.   DIAGNOSIS:  1. No obvious etiology for [name] [name]'s diarrhea identified on      colonoscopy.  2. [name] [name]'s dysphagia may be  secondary to Candida esophagitis, no      strictures were seen or rings.  3. Duodenal diverticulum   RECOMMENDATIONS:  1. No aspirin or NSAIDs for 30 days.  2. No anticoagulation for 7 days.  3. Will call [name] [name] with the results of her biopsies.  4. The patient should be seen in the office to discuss the benefits,      risks and alternatives to the procedure prior to her next      colonoscopy considering her age.  5. High fiber diet.  She is given a handout on high-fiber diet,      diverticulosis, hemorrhoids, gastritis and hiatal hernia.  6. Bentyl 10 mg BID.  7. OPV with Dr. Cira Servant in 6 weeks.   MEDICATIONS:  1. Demerol  75 mg IV.  2. Versed 8 mg IV.  3. Phenergan 12.5 mg IV.   PROCEDURE TECHNIQUE:  Physical exam was performed.  Informed consent was  obtained from the patient after explaining the benefits, risks and  alternatives to the procedure.  The patient was connected to monitor and  placed in left lateral position.  Continuous oxygen was provided by  nasal cannula IV medicine administered through an indwelling cannula.  After administration of sedation and rectal exam, the patient's rectum  intubated.  The scope was advanced under direct visualization to the  cecum.  The scope was removed slowly by careful examine the color,  texture, anatomy and integrity mucosa on the way out.   After the colonoscopy, the patient's esophagus was intubated with a  diagnostic gastroscope.  The scope was advanced under direct  visualization to the second portion of the duodenum.  The scope was  removed slowly by carefully examine the color, texture, anatomy and  integrity of the mucosa on the way out.  The patient was recovered in  endoscopy and discharged home in satisfactory condition.    ADDENDUM:  Path shows H. pylori gastritis and nonspecific colitis, not microscopic  colitis. Changes in rectum are likely secondary to bowel prep and ulcer  in ascending colon likely secondary  to ASA. Continue Bentyl and will  treat H. pylori.      Kassie Mends, M.D.  Electronically Signed     SM/MEDQ  D:  05/26/2007  T:  05/27/2007  Job:  [id number]   cc:   Tesfaye D. Felecia Shelling, MD  Fax: [phone]

## 2010-10-24 NOTE — Letter (Signed)
July 12, 2008    Tesfaye D. Felecia Shelling, MD  [address]  [place], [place] [address]   RE:  DESMOND, TUFANO  MRN:  [id number]  /  DOB:  Aug 07, 1932   Dear Ninetta Lights,   Ms. Braddy returns to the office and scheduled for continued assessment  and treatment of coronary disease, cardiovascular risk factors, and  conduction system disease with pacemaker placement approximately 6 years  ago.  She continues to do superbly with respect to all of her health  issues.  Fasting blood glucose was 114 today.  She monitors blood  pressure at home, but cannot recall recent values.  She has not had her  lipids assessed in some time.  She was given a membership to a local gym  for Christmas and wants to start exercise.  She reports no dyspnea nor  chest discomfort.   CURRENT MEDICATIONS:  1. Humulin N 20 units daily.  2. Levothyroxine 0.2 mg daily.  3. Alprazolam 1 mg daily.  4. Lansoprazole 30 mg daily.  5. Metoprolol 25 mg daily (actual dose was 50 mg daily, but she has      been taking one-half pill inadvertently).  6. Actos 45 mg daily.  7. Lotrel 5/20 mg daily.  8. Furosemide 80 mg daily.  9. Fluvastatin XL 80 mg daily.  10.Multivitamin.  11.Aspirin 81 mg daily.  12.Vitamin E.   PHYSICAL EXAMINATION:  GENERAL:  Pleasant overweight woman in no acute  distress.  VITAL SIGNS:  The weight is 265, 9 pounds more than 1 year ago.  Blood  pressure 150/70, heart rate 70 and regular, respirations 12 and  unlabored.  NECK:  No jugular venous distention; no carotid bruits.  LUNGS:  Clear.  CARDIAC:  Normal first heart sounds; accentuated second heart sounds;  minimal systolic murmur.  ABDOMEN:  Soft and nontender; no organomegaly.  EXTREMITIES:  1+ ankle edema.   IMPRESSION:  Ms. Pittmon has done amazingly well since cardiac  catheterization in 1999 revealed moderate three-vessel coronary disease.  Prior to starting an exercise program, stress testing is warranted.  We  will proceed with a  stress echocardiogram at her earliest convenience.   Lipid profile and chemistry profile will be assessed.  Blood pressure  control is somewhat suboptimal, but she has not been taking the proper  dose of metoprolol.  She will increase to 50 mg per day and check  additional blood pressures at home.  Once all these issues are resolved,  I will plan to see this nice woman again in 10 months.    Sincerely,      Gerrit Friends. Dietrich Pates, MD, [facility]  Electronically Signed    RMR/MedQ  DD: 07/12/2008  DT: 07/13/2008  Job #: [id number]

## 2010-10-24 NOTE — Assessment & Plan Note (Signed)
NAMEALOMA, BOCH                  CHART#:  [id number]   DATE:  05/30/2007                       DOB:  11-Feb-1933   REFERRING PHYSICIAN:  Tesfaye D. Felecia Shelling, MD   PROBLEM LIST:  1. H. pylori gastritis.  2. Candida esophagitis.  3. Chronic diarrhea/constipation likely secondary diabetic enteropathy      or small bowel bacterial overgrowth or irritable bowel syndrome.  4. Adenomatous polyps in 1997, last colonoscopy in 2008.  5. History of esophageal dilation in 2004.  6. Hypothyroidism.  7. COPD.  8. History of hemorrhoidectomy.   SUBJECTIVE:  Ms. Wanninger is a 77 year old female who presents as a  return patient visit.  She had an upper endoscopy and a colonoscopy on  May 26, 2007.  Pathology revealed H. pylori gastritis.  She also  had KOH prep sent for white plaques on her esophagus, which were  positive for yeast.  Her random biopsies showed no evidence of  inflammation or microscopic colitis.  She did have a small ulcer, which  is probably related to her aspirin use.  She was started on Bentyl 10 mg  twice a day and she has a return patient visit on July 03, 2007. She  presents today to discuss her pathology.  She needs medications to treat  her esophagitis, which can interact with each other and requires a face-  to-face discussion.   OBJECTIVE:  Weight 267 pounds, height 5 feet 9 inches.  Temperature  97.9, blood pressure 110/78, pulse 64.  GENERAL:  She is in no apparent distress, alert and oriented x4.  LUNGS:  Clear to auscultation bilaterally.  CARDIOVASCULAR:  Regular rhythm, no murmur, normal S1 and S2.  ABDOMEN:  Bowel sounds are present, soft, nontender, nondistended.   MEDICATIONS:  1. Toprol.  2. Antivert as needed.  3. Xanax as needed.  4. Allegra 180 mg daily.  5. Levoxyl.  6. Lescol.  7. Actos.  8. Prevacid 30 mg daily.  9. Lasix as needed.  10.Humulin.  11.Lotrel 5/20 mg daily.  12.Baclofen.  13.Vicodin as needed.  14.Combivent as needed.  15.Bentyl twice a day.  16.Cipro twice a day.   ASSESSMENT:  Ms. Ennen is a 77 year old female who has Candida  esophagitis and Helicobacter pylori gastritis.  She will require 30 days  of therapy for her Candida esophagitis.  She is on amlodipine, which can  interact with Diflucan.  She is also currently finishing up a  prescription for Cipro.  She has one more pill left.  Thank you for  allowing me to see Ms. Berhow in consultation.  My recommendations  follow.   RECOMMENDATIONS:  1. She should complete her Cipro.  She is to stop her Lotrel after      today.  On May 31, 2007, she is to begin her Biaxin 500 mg      twice daily for 7 days with Flagyl 500 mg twice daily for 7 days.      She also is to increase her Prevacid to twice daily for 7 days.      She is given the side effects of her medications to include      metallic taste and vomiting.  She is warned not to take any cough      syrup.  She is to begin Diflucan 200  mg on December 20 and then 100      mg daily for 30 days.  I did warn her that Diflucan can cause      elevated liver tests and vomiting.  She will be given a      prescription for benazepril 20 mg daily for 30 days. All discharge      instructions are given in writing.  2. She will return for a blood pressure check on June 04, 2007.  3. She has a return patient visit on July 03, 2007.       Kassie Mends, M.D.  Electronically Signed     SM/MEDQ  D:  05/30/2007  T:  05/31/2007  Job:  [id number]   cc:   Tesfaye D. Felecia Shelling, MD  Gerrit Friends. Dietrich Pates, MD, [facility]

## 2010-10-24 NOTE — H&P (Signed)
NAME:  [name] [name]                 ACCOUNT NO.:  [id number]   MEDICAL RECORD NO.:  [id number]          PATIENT TYPE:  AMB   LOCATION:  DAY                           FACILITY:  [facility]   PHYSICIAN:  Kassie Mends, M.D.      DATE OF BIRTH:  [birth year]   DATE OF ADMISSION:  DATE OF DISCHARGE:  LH                              HISTORY & PHYSICAL   CHIEF COMPLAINT:  Chronic diarrhea.   HISTORY OF PRESENT ILLNESS:  [name] [name] is a 77 year old Caucasian  female, previously a patient of Dr. Dionicia Abler, with history of chronic GERD  and chronic constipation.  She is here for a new problem today.  She has  had diarrhea for 3 months now; it has progressively become worse.  She  is having 4 to 5 loose, mucusy bowel movements per day.  She denies any  rectal bleeding or melena.  She was recently on antibiotics, but she  cannot recall why she was taking these.  She denies any other new  medications.  She does have significant urgency as well as a new onset  of incontinence.  She complains of bilateral lower quadrant abdominal  cramping which is intermittent and relieved with defecation.  She is  diabetic, is on insulin, has good control of her blood sugars.  She has  history of hypothyroidism, is on Levoxyl; her dose was increased about 3  months ago.  She also takes baclofen p.r.n. and Vicodin as needed for  pain.  She denies any fever or chills.  Her weight is steadily  increasing.   She also complains of intermittent dysphagia.  She feels as though her  food gets stuck in her upper esophagus.  This has been going on for  quite some time now, although she cannot quantify exactly how long.  She  denies any odynophagia.  Denies any heartburn or indigestion so long as  she takes her Prevacid 30 mg daily.  She does have frequent belching  which is a new problem as well.   PAST MEDICAL AND SURGICAL HISTORY:  She has chronic reflux esophagitis  and history of chronic epigastric pain.  EGD in November 1997  showed  gastritis.  She had a CLOtest which was negative.  She had a normal  gastric emptying study at that time.  She had an adenomatous polyp  removed on colonoscopy in December 1997.  She had a colonoscopy,  February 2003, which was normal __________ diverticula in the sigmoid  colon.  She has had bilateral knee replacements, nasal surgery,  cholecystectomy, hemorrhoidectomy, hysterectomy, pacemaker,  osteoarthritis.  She had an EGD on February 07, 2003 by Dr. Karilyn Cota for  solid food dysphagia.  She had a small sliding hiatal hernia.  She was  empirically dilated with a 58-French Maloney dilator.  She had  nonerosive gastritis.  She has had carpal tunnel release,  hypothyroidism, COPD, and hemorrhoidectomy.   CURRENT MEDICATIONS:  1. Toprol 50 mg daily.  2. Antivert 25 mg b.i.d. p.r.n.  3. Xanax 0.5 mg b.i.d. p.r.n.  4. Allegra 180 mg daily.  5. Levoxyl  225 mcg daily.  6. Lescol 80 mg daily.  7. Actos 45 mg daily.  8. Prevacid 30 mg daily.  9. Lasix 40 mg b.i.d. p.r.n.  10.Humulin-N 10 units in the  morning, 10 units in the evening.  11.Lotrel 5/20 mg daily.  12.Baclofen 10 mg b.i.d. p.r.n.  13.Vicodin p.r.n.   ALLERGIES:  PENICILLIN.   FAMILY HISTORY:  There is no known family history of colon carcinoma,  liver or chronic GI problems.  Both mother and father deceased in their  52s secondary to CHF.  She has lost 2 children, 1 to SIDS and 1 due to  an accident.   SOCIAL HISTORY:  [name] [name] is married.  She has a total of 10  children, 8 alive.  She is retired from a nursing home.  Denies any  tobacco, alcohol, or drug use.   REVIEW OF SYSTEMS:  See HPI, otherwise, negative.   PHYSICAL EXAM:  VITAL SIGNS:  Weight 272 pounds, height:  69 inches.  Temperature 97.6, blood pressure 118/72, pulse 64.  GENERAL:  [name] [name] is an obese, elderly Caucasian female in no acute  distress.  HEENT:  Sclerae clear, nonicteric.  Extraocular movements are pink.  Oropharynx pink and  moist without lesions.  NECK:  Supple without mass or thyromegaly.  CHEST:  Heart regular rate and rhythm.  Normal S1, S2 without any  murmurs, clicks, rubs, or gallops.  LUNGS:  Clear to auscultation bilaterally.  ABDOMEN:  Protuberant with positive bowel sounds x4.  No bruits  auscultated.  Soft, nontender, nondistended without palpable masses or  organomegaly.  No rebound, tenderness, or guarding.  Exam is limited  given the patient's body habitus.  EXTREMITIES:  With trace pretibial and ankle edema bilaterally.   IMPRESSION:  [name] [name] is a 77 year old female with:  1. A 60-month history of diarrhea, previous baseline was constipation.      She has had antibiotics recently.  Therefore, we should rule out      Clostridium difficile.  She also has history of adenomatous polyps      and is due for followup surveillance colonoscopy.  Etiology of her      diarrhea could be contributed to Clostridium difficile or      pseudomembranous colitis given recent antibiotic use, diabetic      diarrhea, or microscopic colitis.  I doubt that this is late onset      inflammatory bowel disease.  2. She has recurrent dysphagia and history of chronic gastroesophageal      reflux disease/erosive esophagitis on proton-pump inhibitor.  She      is going to require further evaluation to rule out esophageal web      ring stricture or occult lesion.   PLAN:  1. We will request recent labs for Dr. Letitia Neri office.  2. EGD with possible esophageal dilatation and colonoscopy with Dr.      Cira Servant in the near future.  I have discussed procedures including      risks and benefits including, but not limited to bleeding,      infection, perforation, drug reaction.  She agrees with the plan.      Consent obtained.  She is going to decrease her insulin dose in      half the day prior to the procedure.  3. Gas, bloat literature given for her review.  4. Full set of stool studies to include culture and sensitivity,       Clostridium difficile, lactoferrin and ova and  parasites.      Lorenza Burton, N.P.      Kassie Mends, M.D.  Electronically Signed    KJ/MEDQ  D:  05/13/2007  T:  05/13/2007  Job:  [id number]   cc:   Tesfaye D. Felecia Shelling, MD  Fax: [phone]

## 2010-10-24 NOTE — Assessment & Plan Note (Signed)
NAMESIARA, GORDER                  CHART#:  [id number]   DATE:  07/03/2007                       DOB:  1932-08-17   REFERRING PHYSICIAN:  Tesfaye D. Felecia Shelling, M.D.   PROBLEM LIST:  1. Helicobacter pylori gastritis.  2. Candida esophagitis.  3. Intermittent diarrhea and constipation likely secondary to diabetic      enteropathy, small bowel bacterial overgrowth, or irritable bowel      syndrome.  4. Adenomatous polyps in 1997-last colonoscopy in 2008.  5. Esophageal dilation in 2004.  6. Hypothyroidism.  7. Chronic obstructive pulmonary disease.  8. History of hemorrhoidectomy.   SUBJECTIVE:  [name] [name] is a 77 year old female who presents as a  return patient visit.  She has no complaints except for she is tired.  She is still having swallowing problems even though she had an EGD in  December 2008 which showed no evidence of stricture or web or ring.  It  happens with meat.  She has been taking the dicyclomine twice a day  which helped with the diarrhea and now the diarrhea is resolved.  She is  complaining of getting a mucus discharge from her rectum even when she  passes gas.  Rectal biopsies in December 2008, did show some focal  active colitis.  She had no architectural distortion.  She completed her  therapy for H. pylori gastritis and Candida esophagitis.  She wants to  know can she restart her Lotrel.   MEDICATIONS:  1. Toprol XL.  2. Levoxyl.  3. Lescol.  4. Actos.  5. Prevacid.  6. Humulin N.  7. Baclofen as needed.  8. Vitamin E.  9. Multivitamin.   PHYSICAL EXAMINATION:  VITAL SIGNS:  Weight 263 pounds, height 5 feet 9  inches, temperature 97.3, blood pressure 158/70, pulse 64.  GENERAL:  She is in no apparent distress.  Alert and oriented x4.LUNGS:  Clear to auscultation bilaterally.CARDIOVASCULAR:  Regular rhythm.  No  murmur.ABDOMEN:  Bowel sounds are present, soft, nontender,  nondistended.  No rebound or guarding.  NEUROLOGIC:  She has no focal  neurologic deficit.   ASSESSMENT:  [name] [name] is a 77 year old female who has dysphagia with  no structural abnormalities on the esophagogastroduodenoscopy in  December 2008.  The differential diagnosis includes esophageal motility  disorder.  She has a mucus discharge from her rectum which may be due to  mild proctitis.  She has no evidence of ulcerative proctitis.  Her  diarrhea is now resolved.   Thank you for allowing me to see [name] [name] in consultation.  My  recommendations follow.   RECOMMENDATIONS:  1. We will schedule a barium swallow to evaluate for esophageal      motility disorder.  She is also asked to restart her Lotrel.  2. She has been given Canasa suppositories and asked to take one at      night for 14 days.  3. She may use the Bentyl as needed at nighttime.  4. I encouraged her to walk 30 minutes 3 times a week in order to      increase her energy levels.  5. She also got a refill on her Prevacid 30 mg daily.  I called both      medicines in to [facility] [phone].  6. She has a follow up appointment to  see me in 2 months.   ADDENDUM:  BaSw-esophageal motility disorder. Pt should follow a soft  mechanical diet. Continue Prevacid.       Kassie Mends, M.D.  Electronically Signed     SM/MEDQ  D:  07/03/2007  T:  07/03/2007  Job:  [id number]   cc:   Tesfaye D. Felecia Shelling, MD  Gerrit Friends. Dietrich Pates, MD, [facility]

## 2010-10-27 NOTE — H&P (Signed)
NAME:  Mccaffery, Dakari                             ACCOUNT NO.:  [id number]   MEDICAL RECORD NO.:  [id number]                   PATIENT TYPE:   LOCATION:                                       FACILITY:   PHYSICIAN:  Lionel December, M.D.                 DATE OF BIRTH:  12-20-1932   DATE OF ADMISSION:  DATE OF DISCHARGE:                                HISTORY & PHYSICAL   CHIEF COMPLAINT:  Problem swallowing solid foods.   HISTORY OF PRESENT ILLNESS:  Mrs. Caul is a 77 year old Caucasian female  with history of chronic reflux esophagitis who presents today at the request  of Dr. Phillips Odor for further evaluation of dysphagia.  She had an upper GI  series with barium pill on January 28, 2003 to further evaluate dysphagia.  She had a small sliding hiatal hernia with mild to moderate gastroesophageal  reflux.  There was no evidence of esophagitis or stricture.  There was  probable mild gastritis of the proximal stomach.  A 12.5 mm tablet passed  freely.  The patient missed her appointment last week but it was arranged  for her to come in today.  She states that for the last three months she has  been having problems swallowing solid foods.  She has no difficulties with  liquids or her pills.  She particularly has problems swallowing meats.  She  continues to have some breakthrough heartburn symptoms particularly in the  early mornings.  She complains of a bitter taste in her mouth.  Denies any  odynophagia or abdominal pain.  Her bowels are moving regularly.  Denies any  melena or rectal bleeding.  She complains of nocturia.  She also tells me  her diabetes is poorly controlled.  She had an EGD in February 2003 to  further evaluate epigastric pain and drop in her hemoglobin.  This revealed  a small sliding hiatal hernia, gastritis involving the gastric body and  antrum.  H. pylori serologies were negative.   CURRENT MEDICATIONS:  1. Vitamin E 400 international units daily.  2. Aspirin 325  mg daily.  3. Neurontin 600 mg two tablets daily.  4. Lipitor 10 mg daily.  5. Lotrel 5/20 mg daily.  6. Nitroglycerin spray p.r.n.  7. Novolin N 40 units q.a.m., 35 units q.p.m.  8. Xanax 2 mg t.i.d. p.r.n.  9. Levoxyl 225 mcg daily.  10.      Prevacid 30 mg daily.  11.      Combivent inhaler p.r.n.   ALLERGIES:  PENICILLIN.   PAST MEDICAL HISTORY:  1. Diabetes mellitus.  2. Hypercholesterolemia.  3. History of goiter for which she had a thyroidectomy about 20 years ago     and is now on replacement therapy.  4. Hypertension.  5. CAD.  6. Osteoarthrosis.  7. Chronic reflux esophagitis.  8. EGD in 1997 revealed CLOtest negative and gastritis.  She had a solid     gastric emptying study at that time as well which was normal.  9. She had a colonoscopy February 2003 which was normal except for a few     tiny diverticula in the sigmoid colon.  10.      She had bilateral knee replacements; the right knee redone in 2003.  11.      She has had nasal surgery, hysterectomy, cholecystectomy,     hemorrhoidectomy.  12.      Pacemaker placed about a year-and-a-half ago.   FAMILY HISTORY:  Mother had CHF, died at 40.  Father had COPD and CHF, died  at 71.  She had a brother who died of lung cancer at age 13.  Another  brother treated for lung cancer.   SOCIAL HISTORY:  She is widowed.  She is retired.  She has four children.  She has never smoked cigarettes.  She has not had any alcohol in several  years.   REVIEW OF SYSTEMS:  Please see HPI for GI.  GENERAL:  Denies any weight  change.  CARDIOPULMONARY:  Denies any chest pain or shortness of breath.   PHYSICAL EXAMINATION:  VITAL SIGNS:  Weight 247, blood pressure 140/60,  pulse 80.  GENERAL:  Pleasant, morbidly-obese Caucasian female in no acute distress.  SKIN:  Warm and dry, no jaundice.  HEENT:  Conjunctivae are pink, sclerae nonicteric.  Oropharynx mucosa moist  and pink.  No lymphadenopathy.  CHEST:  Lungs are clear to  auscultation.  CARDIAC:  Reveals regular rate and rhythm, normal S1, S2.  No murmurs, rubs,  or gallops.  ABDOMEN:  Positive bowel sounds, soft, nontender.  No organomegaly or  masses.  No rebound tenderness or guarding.  EXTREMITIES:  No edema.   IMPRESSION:  Mrs. Quam is a 77 year old lady who has a three-month  history of esophageal dysphagia to solid foods.  Upper GI series with barium  tablet a month ago revealed no evidence of esophagitis or stricture;  however, she did have evidence of reflux as well as a small sliding hiatal  hernia.  I have discussed with the patient today the possibility of EGD with  dilatation to see if she responds.  She would like to proceed with this  option.   PLAN:  1. She will continue Prevacid.  She ran out of medications through the     indigent program; therefore, we have reordered for her.  We will provide     her with three weeks of samples as well.  2. EGD with dilatation in the near future.  3. Further recommendations to follow.      LL/MEDQ  D:  02/24/2003  T:  02/24/2003  Job:  [id number]   cc:   Corrie Mckusick, M.D.  [address] Laurell Josephs. A  [place]  [place] [address]  Fax: [phone]

## 2010-10-27 NOTE — Discharge Summary (Signed)
[place]  Patient:    [name] [name], [name] [name] Visit Number: [id number] MRN: [id number]          Service Type: CAT Location: 4700 [id number] [id number] Attending Physician:  Nathen May Dictated by:   Chinita Pester, N.P. Admit Date:  12/24/2001 Discharge Date: 12/25/2001   CC:         [facility]  Elfredia Nevins, M.D.   Discharge Summary  PRIMARY DIAGNOSIS: Second degree heart block.  HISTORY OF PRESENT ILLNESS: This is a 77 year old female with past medical history of past obstructive coronary artery disease with an EF of 55%. Cardiolite in April of 2002 was negative for ischemia. History of gastroesophageal reflux disease, who presented to the [facility] with complaint of dizziness and presyncope. The patient states that she fell approximately one week ago but denies loss of consciousness. She had an event recorder placed which showed second degree AV block type II who now presents for placement of a permanent Pacemaker. Last cath was in 1999 which showed an RCA of 50%, LAD 70%, and distal circumflex 60%. Had had complaint of palpitations which would slow then speed up. Positive shortness of breath and diaphoresis. Not on any prescriptions at the present time. First episode of palpitations was approximately one month ago. The patient was scheduled for placement of a permanent Pacemaker on December 24, 2001.  PAST MEDICAL HISTORY: As stated above.  HOSPITAL COURSE: The patient was admitted to [facility] and underwent placement of a [employer]tron[employer] dual chamber Pacemaker for second degree AV block type II with a left bundle branch block. The patient tolerated the procedure well and had no immediate postoperative complications. She was 98% tracking. Chest x-ray showed the leads to be in position with no pneumothorax. Upon admission, the patients initial blood work showed a TSH of 0.187 with a T3 and T4 normal. The patients dose of  Synthroid was decreased to 225 mcg by Dr. Graciela Husbands. She was instructed to have that followed up by her primary care physician in [place] and have repeat TSH drawn in approximately six weeks.   DISCHARGE MEDICATIONS: 1. Hydrocortisone cream to the reddened areas from her telemetry spots b.i.d. 2. Synthroid 225 mcg daily. 3. Norvasc 5 mg daily. 4. Lotensin 20 mg daily. 5. Lipitor 10 mg daily. 6. Prevacid 30 mg daily. 7. Avandia 2 mg daily. 8. Vitamin E 400 mg daily. 9. Lasix 60 mg b.i.d. 10.Aspirin 81 mg daily. 11.Iron 325 mg t.i.d. 12.Insulin as before. 13.Neurontin 600 mg t.i.d. 14.Tylenol p.r.n.  ACTIVITY: She was not to do any heavy lifting or strenuous activity with her left arm for four to six weeks. She was not to raise her left arm above her head for one week but gradually raise as depicted on discharge summary.  DIET: Low fat, low cholesterol, low salt diet.  WOUND CARE: She was not to get her wound wet or shower for one week. No creams, ointments, or jellies to her incision.  FOLLOW-UP: She is to be followed in the [facility] Clinic in [place] on January 05, 2002 at 9:45 a.m. and is also to see Dr. Graciela Husbands within three months. Dictated by:   Chinita Pester, N.P. Attending Physician:  Nathen May DD:  12/25/01 TD:  12/30/01 Job: [id number]

## 2010-10-27 NOTE — Letter (Signed)
February 19, 2006     Tesfaye D. Felecia Shelling, MD  [address]  [place], [place] [address]   RE:  [name] [name], [name] [name]  MRN:  [id number]  /  DOB:  [birth year]   Dear Dr. Felecia Shelling:   Mrs. Lichtenberg comes in. She has a history of syncope and intermittent  complete heart block and now she is device dependent. She has no problems  with recurrent syncope. Her major complaint is fatigue. Apparently, she has  undergone a sleep study, which demonstrated sleep apnea; a mask was tried  but she was intolerant. Medications were reviewed and are noted to be  unchanged. On examination, her blood pressure is 140/73, pulse was 80. Lungs  were clear. Heart sounds were regular and extremities were without edema.   IMPRESSION:  1. Complete heart block.  2. Status post pacer for the above.  3. Ischemic heart disease with modest, diffuse disease.  4. Fatigue, probable obstructive sleep apnea.   Dr. Felecia Shelling, Mrs. Bernardi is doing well. She is on Plavix as her anti-platelet  agent, which is okay.   I have suggested that she followup with you about alterative therapies for  her sleep apnea.             We will see her again in 1 years time and continue remote followup every 3  months.    Sincerely,      Duke Salvia, MD, [facility]   SCK/MedQ  DD:  02/19/2006  DT:  02/19/2006  Job #:  [id number]

## 2010-10-27 NOTE — Op Note (Signed)
NAME:  [name] [name], [name] [name]                           ACCOUNT NO.:  [id number]   MEDICAL RECORD NO.:  [id number]                   PATIENT TYPE:  AMB   LOCATION:  DAY                                  FACILITY:  [facility]   PHYSICIAN:  Lionel December, M.D.                 DATE OF BIRTH:  January 19, 1933   DATE OF PROCEDURE:  03/10/2003  DATE OF DISCHARGE:                                 OPERATIVE REPORT   PROCEDURES:  1. Esophagogastroduodenoscopy.  2. Esophageal dilatation.   ENDOSCOPIST:  Lionel December, M.D.   INDICATIONS FOR PROCEDURE:  [name] is a 77 year old Caucasian female with  chronic GERD who is on PPI with dysphagia primarily to solids. Barium study  last month showed a small sliding hiatal hernia with GE reflux but no  evidence of stricture or ring and there was no obstruction to passage of 13  mm barium pill through the esophagus. She is undergoing diagnostic and  therapeutic procedure. The procedure is reviewed with the patient. Informed  consent was obtained.   PREOPERATIVE MEDICATIONS:  Cetacaine spray for pharyngeal topical  anesthesia, Demerol 25 mg IV, Versed 5 mg IV.   DESCRIPTION OF PROCEDURE:  The procedure was performed in the endoscopy  suite. The patient's vital signs and O2 saturation were monitored during the  procedure and remained stable. The patient was placed in the left lateral  decubitus position and Olympus videoscope was passed through oropharynx  without any difficulty into the esophagus.   FINDINGS:  ESOPHAGUS:  The mucosa of the esophagus was normal throughout.  The squamocolumnar junction was wavy but there was no stricture or ring  formation. There was a 3 cm size sliding hiatal hernia.   STOMACH:  It was empty and distended very well with insufflation. Folds of  the proximal stomach were normal. Examination of the mucosa revealed some  antral erythema but no erosions or ulcers are noted. Please note the  patient's H. pylori serology was negative last year  and, therefore, it was  not repeated.  The angularis, fundus and cardia were examined by  retroflexing the scope and were normal.   DUODENUM:  Examination of the bulb revealed normal mucosa. The scope was  passed to the second part of the duodenum where the mucosal folds were  normal.  The endoscope was withdrawn.   The esophagus was dilated by passing 56 and 58-French Maloney dilators to  full insertion. As the dilation was completed, the endoscope was passed  again and no injury was noted to the esophageal mucosa. The endoscope was  withdrawn.   The patient tolerated the procedure well.    FINAL DIAGNOSES:  1. Small sliding hiatal hernia. No evidence of esophagitis, ring or     stricture formation. The esophagus was dilated to 58-French given the     history of dysphagia.  2. Nonerosive gastritis probably related to the use of the  Xanax.   RECOMMENDATIONS:  She will continue antireflux measures. Will increase her  Prilosec to 30 mg b.i.d. and she will return for office visit one month from  now.      ___________________________________________                                            Lionel December, M.D.   NR/MEDQ  D:  03/10/2003  T:  03/10/2003  Job:  [id number]   cc:   Corrie Mckusick, M.D.  [address] [place]. [place]  [place]   [address]  Fax: [phone]

## 2010-10-27 NOTE — Procedures (Signed)
[place]  Patient:    [name] [name], [name] [name] Visit Number: [id number] MRN: [id number]          Service Type: CAT Location: 4700 4736 [id number] Attending Physician:  Nathen May Dictated by:   Nathen May, M.D., [facility] Proc. Date: 12/24/01 Admit Date:  12/24/2001 Discharge Date: 12/25/2001   CC:         [facility]  Electrophysiology Laboratory  [place] [facility], [facility]   Procedure Report  PREOPERATIVE DIAGNOSIS:  Presyncope with documented second degree type II AV block with left bundle branch block.  POSTOPERATIVE DIAGNOSIS:  Presyncope with documented second degree type II AV block with left bundle branch block.  PROCEDURE:  Following the obtaining of informed consent, the patient was brought to the electrophysiology laboratory and placed on the fluoroscopic table in the supine position.  After routine prep and drape of the left upper chest, intravenous contrast was injected via the left antecubital vein to identify the course and patency of the extrathoracic left subclavian vein. This having been accomplished, lidocaine was infiltrated in the prepectoral subclavicular region.  An incision was made and carried down to the layer of the prepectoral fascia using sharp dissection and electrocautery.  A pocket was formed, similarly hemostasis was obtained.  Thereafter attention was turned to gaining access to the extrathoracic left subclavian vein, which was accomplished without difficulty without the aspiration of air or puncture of the artery.  Two separate venipunctures were accomplished, and a guidewire was placed and retained and a 0 silk suture was placed in a figure-of-eight fashion and allowed to hang loosely.  Subsequently a 7 [place] tear-away introducer sheath was placed, through which were passed a Medtronic 5076 58-cm active-fixation ventricular lead, serial number [id number], and a Medtronic 5076  45-cm active-fixation atrial lead, serial number [id number].  Under fluoroscopic guidance these were manipulated to the right ventricular apex and the right atrial appendage, respectively, where the bipolar R-wave was 15.5 millivolts with a pacing impedance of 974 and pacing threshold of 0.8 volts at 0.5 msec, currented threshold was 1.0 MA, and there was no diaphragmatic pacing at 10 volts.  The bipolar P-wave was 1.9 millivolts with a pacing impedance of 1147 Ohms and a pacing threshold immediately after lead deployment of 1.7 volts at 0.5 msec. Currented threshold was 1.9 MA.  With these acceptable parameters recorded, the leads were secured to the prepectoral fascia.  The hemostatic suture was secured.  Hemostasis was obtained.  The pocket was copiously irrigated with antibiotic-containing saline solution, and the leads were then attached to a Medtronic Kappa [id number] pulse generator, serial number [id number].  P-synchronous pacing was identified.  The leads and the pulse generator were then placed in the pocket and secured to the prepectoral fascia.  The wound was closed in three layers then in the normal fashion.  The wound was washed, dried, and a benzoin and Steri-Strip dressing was then applied.  Needle counts, sponge counts, and instrument counts were correct at the end of the procedure according to the staff.  The patient had no apparent complications. Dictated by:   Nathen May, M.D., [facility] Attending Physician:  Nathen May DD:  12/24/01 TD:  12/29/01 Job: [id number]

## 2010-10-30 ENCOUNTER — Other Ambulatory Visit (INDEPENDENT_AMBULATORY_CARE_PROVIDER_SITE_OTHER): Payer: Medicare Other

## 2010-10-30 ENCOUNTER — Encounter: Payer: Self-pay | Admitting: Internal Medicine

## 2010-10-30 ENCOUNTER — Ambulatory Visit (INDEPENDENT_AMBULATORY_CARE_PROVIDER_SITE_OTHER): Payer: Medicare Other | Admitting: Internal Medicine

## 2010-10-30 DIAGNOSIS — E119 Type 2 diabetes mellitus without complications: Secondary | ICD-10-CM

## 2010-10-30 DIAGNOSIS — E039 Hypothyroidism, unspecified: Secondary | ICD-10-CM

## 2010-10-30 DIAGNOSIS — Z794 Long term (current) use of insulin: Secondary | ICD-10-CM

## 2010-10-30 DIAGNOSIS — E785 Hyperlipidemia, unspecified: Secondary | ICD-10-CM

## 2010-10-30 DIAGNOSIS — M25559 Pain in unspecified hip: Secondary | ICD-10-CM

## 2010-10-30 LAB — TSH: TSH: 0.61 u[IU]/mL (ref 0.35–5.50)

## 2010-10-30 NOTE — Assessment & Plan Note (Signed)
Dose statin increased 06/2010 - improved on f/u labs The current medical regimen is effective;  continue present plan and medications.

## 2010-10-30 NOTE — Progress Notes (Signed)
  Subjective:    Patient ID: [name] [name], female    DOB: [birth year], 77 y.o.   MRN: [id number]  HPI  Here for follow up -reviewed chronic medical issues:  HTN - reports compliance with ongoing medical treatment; recent changes in medication reviewed - prev off amlodipine in lotrel due to edema - then resumed due to intolerance of dilt - the patient reports compliance with medication(s) as prescribed. Denies adverse side effects.  hypothyroid - reports compliance with ongoing medical treatment; no changes in medication dose or frequency. denies adverse side effects related to current therapy.  DM2 - on insulin and oral meds - reports compliance with ongoing medical treatment; interval changes in medication reviewed - off actos due to fear SE, on generic amaryl and insulin. denies adverse side effects related to current therapy. no hypoglycemia symptoms or events - checks cbg 2x/d before meals -   dyslipidemia - dose statin increased by cards 07/10/10 - pt remains skeptical about meds and fearful of poss SE but taking as rx'd without problem at this time - improved FLP in 08/2010 at recheck  Past Medical History  Diagnosis Date  . VITAMIN B12 DEFICIENCY 10/11/2009  . Sinoatrial node dysfunction   . Chronic diastolic heart failure 12/15/2008  . HELICOBACTER PYLORI GASTRITIS, HX OF 02/17/2009  . Cardiac pacemaker in situ 2003  . Irritable bowel syndrome (IBS)   . ANXIETY   . CHF (congestive heart failure)   . COPD   . HYPERLIPIDEMIA   . HYPERTENSION   . HYPOTHYROIDISM   . Carpal tunnel syndrome 12/26/2009  . PERIPHERAL NEUROPATHY   . GERD     with HH  . OSTEOARTHRITIS, KNEES, BILATERAL     B TKR    Review of Systems  Cardiovascular: Negative for chest pain.  Gastrointestinal: Positive for constipation. Negative for vomiting and abdominal pain.  Genitourinary: Negative for dysuria and difficulty urinating.  Neurological: Negative for weakness and headaches.       Objective:   Physical Exam  Constitutional: She is oriented to person, place, and time. She appears well-developed and well-nourished. No distress.       Obese.  Cardiovascular: Normal rate, regular rhythm and normal heart sounds.   No murmur heard.      1+ BLE edema  Pulmonary/Chest: Effort normal and breath sounds normal. No respiratory distress. She has no wheezes.  Neurological: She is alert and oriented to person, place, and time.       Lab Results  Component Value Date   WBC 7.0 12/30/2009   HGB 13.7 12/30/2009   HCT 39.7 12/30/2009   PLT 237.0 12/30/2009   CHOL 158 08/25/2010   TRIG 115 08/25/2010   HDL 48 08/25/2010   ALT 27 09/17/8117   AST 33 12/18/2009   NA 138 07/03/2010   K 4.2 07/03/2010   CL 99 07/03/2010   CREATININE 0.80 07/03/2010   BUN 12 07/03/2010   CO2 29 07/03/2010   TSH 8.96* 07/21/2010   HGBA1C 7.8* 07/21/2010   MICROALBUR 2.7* 08/29/2009    Assessment & Plan:  See problem list. Medications and labs reviewed today. Edema, mild BLE - will Increase lasix to bid x 5 days due to peripheral edema, then resume qd dose - to call if continued swelling or other problems

## 2010-10-30 NOTE — Assessment & Plan Note (Signed)
Well controlled by hx review - check a1c now and adjust meds as needed

## 2010-10-30 NOTE — Assessment & Plan Note (Signed)
Difficult with medication splitting - but otherwise doing well Check lab today - adjust as needed Lab Results  Component Value Date   TSH 8.96* 07/21/2010

## 2010-10-30 NOTE — Patient Instructions (Addendum)
It was good to see you today. Medications reviewed, increase lasix to 2x/day for 5 days for swelling then resume once daily - no other changes at this time. Test(s) ordered today. Your results will be called to you after review (48-72hours after test completion). If any changes need to be made, you will be notified at that time. we'll make referral to  Spine specialist. Our office will contact you regarding appointment(s) once made. Please schedule followup in 3-4 months, call sooner if problems.

## 2010-10-30 NOTE — Assessment & Plan Note (Signed)
Has seen ortho at [facility] who rec eval by back specialist - will arrange same (Nsurg at [facility] or whoever works with pt's plan) Will arrange HHPT until we can get this done

## 2010-10-31 ENCOUNTER — Other Ambulatory Visit: Payer: Self-pay | Admitting: Internal Medicine

## 2010-11-01 ENCOUNTER — Other Ambulatory Visit: Payer: Self-pay | Admitting: *Deleted

## 2010-11-01 DIAGNOSIS — F419 Anxiety disorder, unspecified: Secondary | ICD-10-CM

## 2010-11-01 MED ORDER — ALPRAZOLAM 0.5 MG PO TABS: 0.5000 mg | ORAL_TABLET | Freq: Two times a day (BID) | ORAL | Status: DC

## 2010-11-01 MED ORDER — NYSTATIN-TRIAMCINOLONE 100000-0.1 UNIT/GM-% EX CREA: TOPICAL_CREAM | Freq: Three times a day (TID) | CUTANEOUS | Status: DC | PRN

## 2010-11-01 NOTE — Telephone Encounter (Signed)
Faxed script back to [facility]...11/01/10@1 :27pm/LMB

## 2010-11-01 NOTE — Telephone Encounter (Signed)
rx already sent to [facility] pharmacy..11/01/10@1 :27pm

## 2010-11-07 ENCOUNTER — Ambulatory Visit: Payer: MEDICARE | Admitting: Internal Medicine

## 2010-11-09 ENCOUNTER — Ambulatory Visit: Payer: Self-pay | Admitting: Internal Medicine

## 2010-11-10 ENCOUNTER — Other Ambulatory Visit: Payer: Self-pay | Admitting: Internal Medicine

## 2010-11-17 ENCOUNTER — Ambulatory Visit (INDEPENDENT_AMBULATORY_CARE_PROVIDER_SITE_OTHER): Payer: Medicare Other | Admitting: Internal Medicine

## 2010-11-17 ENCOUNTER — Encounter: Payer: Self-pay | Admitting: Internal Medicine

## 2010-11-17 VITALS — BP 130/60 | HR 65 | Temp 97.7°F | Resp 16 | Wt 264.0 lb

## 2010-11-17 DIAGNOSIS — N390 Urinary tract infection, site not specified: Secondary | ICD-10-CM

## 2010-11-17 LAB — POCT URINALYSIS DIPSTICK
Bilirubin, UA: NEGATIVE
Glucose, UA: NEGATIVE
Spec Grav, UA: 1.01
Urobilinogen, UA: NEGATIVE

## 2010-11-17 MED ORDER — CIPROFLOXACIN HCL 500 MG PO TABS: 500.0000 mg | ORAL_TABLET | Freq: Two times a day (BID) | ORAL | Status: AC

## 2010-11-17 NOTE — Progress Notes (Signed)
Subjective:    Patient ID: [name] [name], female    DOB: 1932-11-26, 77 y.o.   MRN: [id number]  Urinary Tract Infection  This is a new problem. The current episode started yesterday. The problem occurs every urination. The problem has been gradually worsening. The quality of the pain is described as burning. The pain is at a severity of 2/10. The pain is mild. There has been no fever. Associated symptoms include frequency, nausea and urgency. Pertinent negatives include no chills, discharge, flank pain, hematuria, hesitancy, possible pregnancy, sweats or vomiting. She has tried nothing for the symptoms. The treatment provided no relief.      Review of Systems  Constitutional: Negative for fever, chills, diaphoresis, activity change, appetite change, fatigue and unexpected weight change.  HENT: Negative for facial swelling, neck pain and neck stiffness.   Eyes: Negative for photophobia and visual disturbance.  Respiratory: Negative for apnea, cough, choking, chest tightness, shortness of breath, wheezing and stridor.   Cardiovascular: Negative for chest pain, palpitations and leg swelling.  Gastrointestinal: Positive for nausea. Negative for vomiting, abdominal pain, diarrhea, constipation and abdominal distention.  Genitourinary: Positive for dysuria, urgency and frequency. Negative for hesitancy, hematuria, flank pain, decreased urine volume, vaginal bleeding, vaginal discharge, enuresis, difficulty urinating, genital sores, vaginal pain, menstrual problem, pelvic pain and dyspareunia.  Musculoskeletal: Negative.   Skin: Negative for color change, pallor and rash.  Neurological: Negative for dizziness, tremors, seizures, syncope, facial asymmetry, speech difficulty, weakness, light-headedness, numbness and headaches.  Hematological: Negative for adenopathy. Does not bruise/bleed easily.  Psychiatric/Behavioral: Negative.        Objective:   Physical Exam   [vitalsreviewed. Constitutional: She is oriented to person, place, and time. She appears well-developed and well-nourished. No distress.  HENT:  Head: Normocephalic and atraumatic.  Right Ear: External ear normal.  Left Ear: External ear normal.  Nose: Nose normal.  Mouth/Throat: Oropharynx is clear and moist. No oropharyngeal exudate.  Eyes: Conjunctivae and EOM are normal. Pupils are equal, round, and reactive to light. Right eye exhibits no discharge. Left eye exhibits no discharge. No scleral icterus.  Neck: Normal range of motion. Neck supple. No JVD present. No tracheal deviation present. No thyromegaly present.  Cardiovascular: Normal rate, regular rhythm, normal heart sounds and intact distal pulses.  Exam reveals no gallop and no friction rub.   No murmur heard. Pulmonary/Chest: Effort normal and breath sounds normal. No stridor. No respiratory distress. She has no wheezes. She has no rales. She exhibits no tenderness.  Abdominal: Soft. Normal appearance and bowel sounds are normal. She exhibits no distension and no mass. There is no hepatosplenomegaly. There is no tenderness. There is no rigidity, no rebound, no guarding, no CVA tenderness, no tenderness at McBurney's point and negative Murphy's sign. No hernia.  Musculoskeletal: Normal range of motion. She exhibits no edema and no tenderness.  Lymphadenopathy:    She has no cervical adenopathy.  Neurological: She is alert and oriented to person, place, and time. She has normal reflexes. She displays normal reflexes. No cranial nerve deficit. She exhibits normal muscle tone. Coordination normal.  Skin: Skin is warm and dry. No rash noted. She is not diaphoretic. No erythema. No pallor.  Psychiatric: She has a normal mood and affect. Her behavior is normal. Judgment and thought content normal.        Lab Results  Component Value Date   WBC 7.0 12/30/2009   HGB 13.7 12/30/2009   HCT 39.7 12/30/2009   PLT 237.0 12/30/2009  CHOL  158 08/25/2010   TRIG 115 08/25/2010   HDL 48 08/25/2010   ALT 27 0/45/4098   AST 33 12/18/2009   NA 138 07/03/2010   K 4.2 07/03/2010   CL 99 07/03/2010   CREATININE 0.80 07/03/2010   BUN 12 07/03/2010   CO2 29 07/03/2010   TSH 0.61 10/30/2010   HGBA1C 8.0* 10/30/2010   MICROALBUR 2.7* 08/29/2009    Assessment & Plan:

## 2010-11-17 NOTE — Assessment & Plan Note (Signed)
Start cipro and check urine culture 

## 2010-11-17 NOTE — Patient Instructions (Signed)
Urinary Tract Infection (UTI) Infections of the urinary tract can start in several places. A bladder infection (cystitis), a kidney infection (pyelonephritis), and a prostate infection (prostatitis) are different types of urinary tract infections. They usually get better if treated with medicines (antibiotics) that kill germs. Take all the medicine until it is gone. You or your child may feel better in a few days, but TAKE ALL MEDICINE or the infection may not respond and may become more difficult to treat. HOME CARE INSTRUCTIONS  Drink enough water and fluids to keep the urine clear or pale yellow. Cranberry juice is especially recommended, in addition to large amounts of water.   Avoid caffeine, tea, and carbonated beverages. They tend to irritate the bladder.   Alcohol may irritate the prostate.   Only take over-the-counter or prescription medicines for pain, discomfort, or fever as directed by your caregiver.  FINDING OUT THE RESULTS OF YOUR TEST Not all test results are available during your visit. If your or your child's test results are not back during the visit, make an appointment with your caregiver to find out the results. Do not assume everything is normal if you have not heard from your caregiver or the medical facility. It is important for you to follow up on all test results. TO PREVENT FURTHER INFECTIONS:  Empty the bladder often. Avoid holding urine for long periods of time.   After a bowel movement, women should cleanse from front to back. Use each tissue only once.   Empty the bladder before and after sexual intercourse.  SEEK MEDICAL CARE IF:  There is back pain.   You or your child has an oral temperature above 100.5.   Your baby is older than 3 months with a rectal temperature of 100.5 F (38.1 C) or higher for more than 1 day.   Your or your child's problems (symptoms) are no better in 3 days. Return sooner if you or your child is getting worse.  SEEK IMMEDIATE  MEDICAL CARE IF:  There is severe back pain or lower abdominal pain.   You or your child develops chills.   You or your child has an oral temperature above 100.5, not controlled by medicine.   Your baby is older than 3 months with a rectal temperature of 102 F (38.9 C) or higher.   Your baby is 3 months old or younger with a rectal temperature of 100.4 F (38 C) or higher.   There is nausea or vomiting.   There is continued burning or discomfort with urination.  MAKE SURE YOU:  Understand these instructions.   Will watch this condition.   Will get help right away if you or your child is not doing well or gets worse.  Document Released: 03/07/2005 Document Re-Released: 08/22/2009 Exit[employer] Patient Information 2011 [employer]. 

## 2010-11-28 ENCOUNTER — Encounter: Payer: Self-pay | Admitting: Internal Medicine

## 2010-11-28 ENCOUNTER — Ambulatory Visit (INDEPENDENT_AMBULATORY_CARE_PROVIDER_SITE_OTHER): Payer: Medicare Other | Admitting: Internal Medicine

## 2010-11-28 DIAGNOSIS — I10 Essential (primary) hypertension: Secondary | ICD-10-CM

## 2010-11-28 DIAGNOSIS — Z01818 Encounter for other preprocedural examination: Secondary | ICD-10-CM

## 2010-11-28 DIAGNOSIS — I5032 Chronic diastolic (congestive) heart failure: Secondary | ICD-10-CM

## 2010-11-28 DIAGNOSIS — I442 Atrioventricular block, complete: Secondary | ICD-10-CM

## 2010-11-28 DIAGNOSIS — Z950 Presence of cardiac pacemaker: Secondary | ICD-10-CM

## 2010-11-28 DIAGNOSIS — I509 Heart failure, unspecified: Secondary | ICD-10-CM

## 2010-11-28 NOTE — Patient Instructions (Signed)
**Note De-Identified  Obfuscation** Your physician recommends that you continue on your current medications as directed. Please refer to the Current Medication list given to you today.  Your physician recommends that you return for lab work in: June 25  Your physician recommends that you schedule a follow-up appointment in: after procedure

## 2010-11-29 ENCOUNTER — Encounter: Payer: Self-pay | Admitting: Internal Medicine

## 2010-11-29 NOTE — Assessment & Plan Note (Signed)
Her current symptoms are class II. She will continue her current medications and maintain a low-sodium diet.

## 2010-11-29 NOTE — Progress Notes (Signed)
HPI [name] [name] returns today for followup. She is a 77 year old woman with a history of symptomatic bradycardia status post pacemaker insertion, long-standing diabetes, bradycardia, obesity, hypertension, and dyslipidemia. The patient has been fairly inactive and sedentary. She has been unable to lose weight. She has not had syncope. She has problems with arthritis. She has minimal palpitations and minimal shortness of breath. I suspect this is largely related to her sedentary lifestyle. She has reached elective replacement on her pacemaker. Allergies  Allergen Reactions  . Penicillins      Current Outpatient Prescriptions  Medication Sig Dispense Refill  . albuterol-ipratropium (COMBIVENT) 18-103 MCG/ACT inhaler Inhale 2 puffs into the lungs every 6 (six) hours as needed.        . ALPRAZolam (XANAX) 0.5 MG tablet Take 1 tablet (0.5 mg total) by mouth 2 (two) times daily.  60 tablet  1  . amLODipine (NORVASC) 10 MG tablet Take 10 mg by mouth daily.        Marland Kitchen aspirin 81 MG tablet Take 81 mg by mouth daily.        . benazepril (LOTENSIN) 20 MG tablet Take 20 mg by mouth daily.        . Cholecalciferol (VITAMIN D3) 1000 UNITS CAPS Take by mouth daily.        . cyanocobalamin (,VITAMIN B-12,) 1000 MCG/ML injection Inject 1,000 mcg into the muscle every 30 (thirty) days.        . furosemide (LASIX) 40 MG tablet Take 1 tablet (40 mg total) by mouth daily. Or as directed for swelling  40 tablet  6  . gabapentin (NEURONTIN) 300 MG capsule Take 300 mg by mouth at bedtime.        Marland Kitchen glimepiride (AMARYL) 2 MG tablet Take 2 mg by mouth daily before breakfast.        . insulin NPH (HUMULIN N) 100 UNIT/ML injection Inject 25 Units into the skin 2 (two) times daily before a meal.  10 mL  11  . Lancets MISC       . levothyroxine (SYNTHROID, LEVOTHROID) 200 MCG tablet Take 200 mcg by mouth daily.        Marland Kitchen levothyroxine (SYNTHROID, LEVOTHROID) 25 MCG tablet Take 25 mcg by mouth daily. Take in addition to        . metoprolol (TOPROL XL) 50 MG 24 hr tablet Take 50 mg by mouth daily.        . nitroGLYCERIN (NITROLINGUAL) 0.4 MG/SPRAY spray Place 1 spray under the tongue every 5 (five) minutes as needed.        . nystatin-triamcinolone (MYCOLOG II) cream Apply topically 3 (three) times daily as needed.  60 g  0  . omeprazole (PRILOSEC) 20 MG capsule Take 20 mg by mouth daily.        . potassium chloride SA (K-DUR,KLOR-CON) 20 MEQ tablet Take 20 mEq by mouth daily.        . pravastatin (PRAVACHOL) 80 MG tablet Take 80 mg by mouth at bedtime.        . solifenacin (VESICARE) 5 MG tablet Take 10 mg by mouth daily.        . SYRINGE-NEEDLE, DISP, 3 ML (B-D 3CC LUER-LOK SYR 23GX1-1/4) 23G X 1-1/4" 3 ML MISC by Does not apply route every 30 (thirty) days. To dispense B12 monthly       . triamcinolone (KENALOG) 0.1 % lotion Apply topically 2 (two) times daily.        . VENTOLIN HFA 108 (90  BASE) MCG/ACT inhaler INHALE 2 PUFFS 4 TIMES A DAY AS DIRECTED  18 g  6  . VICODIN 5-500 MG per tablet TAKE 1/2 TO 1 TABLET EVERY 8 HOURS AS NEEDED FOR PAIN  30 each  1  . vitamin E (VITAMIN E) 400 UNIT capsule Take 400 Units by mouth daily.           Past Medical History  Diagnosis Date  . VITAMIN B12 DEFICIENCY 10/11/2009  . Sinoatrial node dysfunction   . Chronic diastolic heart failure 12/15/2008  . HELICOBACTER PYLORI GASTRITIS, HX OF 02/17/2009  . Cardiac pacemaker in situ 2003  . Irritable bowel syndrome (IBS)   . ANXIETY   . CHF (congestive heart failure)   . COPD   . HYPERLIPIDEMIA   . HYPERTENSION   . HYPOTHYROIDISM   . Carpal tunnel syndrome 12/26/2009  . PERIPHERAL NEUROPATHY   . GERD     with HH  . OSTEOARTHRITIS, KNEES, BILATERAL     B TKR    ROS:   All systems reviewed and negative except as noted in the HPI.   Past Surgical History  Procedure Date  . Bilateral tka   . Nasal fracture surgery   . Cholecystectomy   . Abdominal hysterectomy   . Hemorrhoid surgery   . Carpal tunnel release     . Thyroidectomy 1980  . Bladder repair   . Cardiac pacemaker placement 2003    medtronic, dual chamber     Family History  Problem Relation Age of Onset  . COPD Father      History   Social History  . Marital Status: Married    Spouse Name: N/A    Number of Children: N/A  . Years of Education: N/A   Occupational History  . Not on file.   Social History Main Topics  . Smoking status: Never Smoker   . Smokeless tobacco: Not on file   Comment: Retired- lives with spouse (who has dementia). Pt has 4 children  . Alcohol Use: No  . Drug Use: No  . Sexually Active: Not Currently   Other Topics Concern  . Not on file   Social History Narrative  . No narrative on file     BP 143/73  Pulse 65  Wt 259 lb (117.482 kg)  Physical Exam:  Well appearing NAD HEENT: Unremarkable Neck:  No JVD, no thyromegally Lymphatics:  No adenopathy Back:  No CVA tenderness Lungs:  Clear. Well-healed pacemaker incision. HEART:  IRegular rate rhythm, no murmurs, no rubs, no clicks Abd:   positive bowel sounds, no organomegally, no rebound, no guarding Ext:  2 plus pulses, no edema, no cyanosis, no clubbing Skin:  No rashes no nodules Neuro:  CN II through XII intact, motor grossly intact  DEVICE  Normal device function.  See PaceArt for details.   Assess/Plan:

## 2010-11-29 NOTE — Assessment & Plan Note (Signed)
Her blood pressure is slightly elevated. She admits to dietary indiscretion with sodium. I have encouraged her to reduce her salt intake. She will continue her current medications.

## 2010-11-29 NOTE — Assessment & Plan Note (Signed)
Her device is working normally but has reached elective replacement. We'll schedule pacemaker generator change in the next several days.

## 2010-11-30 ENCOUNTER — Encounter: Payer: Self-pay | Admitting: *Deleted

## 2010-11-30 ENCOUNTER — Other Ambulatory Visit: Payer: Self-pay | Admitting: *Deleted

## 2010-11-30 MED ORDER — HYDROCODONE-ACETAMINOPHEN 5-500 MG PO TABS: ORAL_TABLET | ORAL | Status: DC

## 2010-11-30 NOTE — Telephone Encounter (Signed)
Med is not on medication list. Is this ok to refill? 

## 2010-11-30 NOTE — Telephone Encounter (Signed)
Faxed script back to [facility] pharmacy @ [phone].Marland KitchenMarland Kitchen6/21/12@1 :28pm/LMB

## 2010-11-30 NOTE — Telephone Encounter (Signed)
yes

## 2010-12-04 ENCOUNTER — Ambulatory Visit (INDEPENDENT_AMBULATORY_CARE_PROVIDER_SITE_OTHER): Payer: Medicare Other | Admitting: Internal Medicine

## 2010-12-04 ENCOUNTER — Encounter: Payer: Self-pay | Admitting: Internal Medicine

## 2010-12-04 ENCOUNTER — Other Ambulatory Visit: Payer: Self-pay | Admitting: Internal Medicine

## 2010-12-04 ENCOUNTER — Other Ambulatory Visit: Payer: Medicare Other

## 2010-12-04 VITALS — BP 120/60 | HR 64 | Temp 97.6°F | Ht 70.0 in | Wt 252.8 lb

## 2010-12-04 DIAGNOSIS — N390 Urinary tract infection, site not specified: Secondary | ICD-10-CM

## 2010-12-04 LAB — POCT URINALYSIS DIPSTICK
Bilirubin, UA: NEGATIVE
Ketones, UA: NEGATIVE
Nitrite, UA: POSITIVE
pH, UA: 5

## 2010-12-04 MED ORDER — SULFAMETHOXAZOLE-TRIMETHOPRIM 800-160 MG PO TABS: 1.0000 | ORAL_TABLET | Freq: Two times a day (BID) | ORAL | Status: DC

## 2010-12-04 NOTE — Patient Instructions (Signed)
It was good to see you today. Septra antibiotics for your bladder infection - Your prescription(s) have been submitted to your pharmacy. Please take as directed and contact our office if you believe you are having problem(s) with the medication(s). Test(s) ordered today. Your results will be called to you after review (72hours after test completion). If any changes need to be made, you will be notified at that time.

## 2010-12-04 NOTE — Progress Notes (Signed)
  Subjective:    Patient ID: [name] [name], female    DOB: 04-16-1933, 77 y.o.   MRN: [id number]  HPI  complains of suprapubic pain Onset 3 days ago associated with dysuria and spasm with urination +hx same with prior UTI Small volume voiding Denies fever or nausea and vomiting - no bowel changes  Past Medical History  Diagnosis Date  . Sinoatrial node dysfunction   . Cardiac pacemaker in situ 2003  . Irritable bowel syndrome (IBS)   . ANXIETY   . CHF (congestive heart failure)   . COPD   . HYPERLIPIDEMIA   . HYPERTENSION   . HYPOTHYROIDISM   . GERD     with HH  . OSTEOARTHRITIS, KNEES, BILATERAL     B TKR  . VITAMIN B12 DEFICIENCY   . HELICOBACTER PYLORI GASTRITIS, HX OF 02/2009  . Chronic diastolic heart failure   . Carpal tunnel syndrome   . PERIPHERAL NEUROPATHY     Review of Systems  Constitutional: Positive for chills.  Cardiovascular: Negative for chest pain.  Genitourinary: Negative for hematuria and flank pain.  Psychiatric/Behavioral: Negative for confusion.       Objective:   Physical Exam BP 120/60  Pulse 64  Temp(Src) 97.6 F (36.4 C) (Oral)  Ht 5\' 10"  (1.778 m)  Wt 252 lb 12.8 oz (114.669 kg)  BMI 36.27 kg/m2  SpO2 96% Wt Readings from Last 3 Encounters:  12/04/10 252 lb 12.8 oz (114.669 kg)  11/28/10 259 lb (117.482 kg)  11/17/10 264 lb (119.75 kg)    Physical Exam  Constitutional: She is oriented to person, place, and time. She appears well-developed and well-nourished. No distress.  Neck: Normal range of motion. Neck supple. No JVD present. No thyromegaly present.  Cardiovascular: Normal rate, regular rhythm and normal heart sounds.  No murmur heard. No BLE edema. Pulmonary/Chest: Effort normal and breath sounds normal. No respiratory distress. She has no wheezes.  Abdominal: Soft. Bowel sounds are normal. She exhibits no distension. Mild suprapubic tenderness to palpation  Psychiatric: She has a normal mood and affect. Her behavior is  normal. Judgment and thought content normal.   Lab Results  Component Value Date   WBC 7.0 12/30/2009   HGB 13.7 12/30/2009   HCT 39.7 12/30/2009   PLT 237.0 12/30/2009   CHOL 158 08/25/2010   TRIG 115 08/25/2010   HDL 48 08/25/2010   ALT 27 09/17/8117   AST 33 12/18/2009   NA 138 07/03/2010   K 4.2 07/03/2010   CL 99 07/03/2010   CREATININE 0.80 07/03/2010   BUN 12 07/03/2010   CO2 29 07/03/2010   TSH 0.61 10/30/2010   HGBA1C 8.0* 10/30/2010   MICROALBUR 2.7* 08/29/2009        Assessment & Plan:  UTI - recent eval and tx for same by [facility]J with cipro (OV 11/17/10 reviewed) - no Ucx data but recurrent symptoms off cipro - send now for Ucx and start septra pending results - reviewed proper hygiene for UTI prevention strategies

## 2010-12-05 ENCOUNTER — Telehealth: Payer: Self-pay | Admitting: Internal Medicine

## 2010-12-05 LAB — CBC WITH DIFFERENTIAL/PLATELET
Basophils Absolute: 0 10*3/uL (ref 0.0–0.1)
Basophils Relative: 1 % (ref 0–1)
Eosinophils Relative: 2 % (ref 0–5)
HCT: 43.1 % (ref 36.0–46.0)
MCHC: 32.7 g/dL (ref 30.0–36.0)
MCV: 87.1 fL (ref 78.0–100.0)
Monocytes Absolute: 0.5 10*3/uL (ref 0.1–1.0)
Platelets: 256 10*3/uL (ref 150–400)
RDW: 12.9 % (ref 11.5–15.5)
WBC: 6.5 10*3/uL (ref 4.0–10.5)

## 2010-12-05 LAB — BASIC METABOLIC PANEL
CO2: 26 mEq/L (ref 19–32)
Calcium: 9.8 mg/dL (ref 8.4–10.5)
Chloride: 102 mEq/L (ref 96–112)
Creat: 1.05 mg/dL (ref 0.50–1.10)
Glucose, Bld: 111 mg/dL — ABNORMAL HIGH (ref 70–99)

## 2010-12-05 NOTE — Telephone Encounter (Signed)
Pt needs to know when her procedure is and when she needs to be at hospital

## 2010-12-07 ENCOUNTER — Ambulatory Visit (HOSPITAL_COMMUNITY)
Admission: RE | Admit: 2010-12-07 | Discharge: 2010-12-07 | Disposition: A | Discharge status: Home / Self Care | Payer: Medicare Other | Source: Ambulatory Visit | Attending: Internal Medicine | Admitting: Internal Medicine

## 2010-12-07 DIAGNOSIS — Z79899 Other long term (current) drug therapy: Secondary | ICD-10-CM | POA: Insufficient documentation

## 2010-12-07 DIAGNOSIS — G609 Hereditary and idiopathic neuropathy, unspecified: Secondary | ICD-10-CM | POA: Insufficient documentation

## 2010-12-07 DIAGNOSIS — F411 Generalized anxiety disorder: Secondary | ICD-10-CM | POA: Insufficient documentation

## 2010-12-07 DIAGNOSIS — K219 Gastro-esophageal reflux disease without esophagitis: Secondary | ICD-10-CM | POA: Insufficient documentation

## 2010-12-07 DIAGNOSIS — Z45018 Encounter for adjustment and management of other part of cardiac pacemaker: Secondary | ICD-10-CM | POA: Insufficient documentation

## 2010-12-07 DIAGNOSIS — E039 Hypothyroidism, unspecified: Secondary | ICD-10-CM | POA: Insufficient documentation

## 2010-12-07 DIAGNOSIS — Z7982 Long term (current) use of aspirin: Secondary | ICD-10-CM | POA: Insufficient documentation

## 2010-12-07 DIAGNOSIS — I442 Atrioventricular block, complete: Secondary | ICD-10-CM

## 2010-12-07 DIAGNOSIS — I509 Heart failure, unspecified: Secondary | ICD-10-CM | POA: Insufficient documentation

## 2010-12-07 DIAGNOSIS — E669 Obesity, unspecified: Secondary | ICD-10-CM | POA: Insufficient documentation

## 2010-12-07 DIAGNOSIS — E119 Type 2 diabetes mellitus without complications: Secondary | ICD-10-CM | POA: Insufficient documentation

## 2010-12-07 DIAGNOSIS — I10 Essential (primary) hypertension: Secondary | ICD-10-CM | POA: Insufficient documentation

## 2010-12-07 DIAGNOSIS — I5032 Chronic diastolic (congestive) heart failure: Secondary | ICD-10-CM | POA: Insufficient documentation

## 2010-12-07 DIAGNOSIS — E538 Deficiency of other specified B group vitamins: Secondary | ICD-10-CM | POA: Insufficient documentation

## 2010-12-07 DIAGNOSIS — E785 Hyperlipidemia, unspecified: Secondary | ICD-10-CM | POA: Insufficient documentation

## 2010-12-07 LAB — URINE CULTURE

## 2010-12-07 LAB — GLUCOSE, CAPILLARY: Glucose-Capillary: 80 mg/dL (ref 70–99)

## 2010-12-08 NOTE — Telephone Encounter (Signed)
Just got message today and patient has already had procedure  This is a [facility] pt and should have been routed to them  This message came into  my in basket through result reports and not as a message

## 2010-12-18 ENCOUNTER — Telehealth: Payer: Self-pay | Admitting: Cardiology

## 2010-12-18 NOTE — Telephone Encounter (Signed)
Patient states that she is itching around the bandage at pacemaker insertion site / wants to know what to do about it / states that it is scaring her / tg

## 2010-12-20 ENCOUNTER — Ambulatory Visit (INDEPENDENT_AMBULATORY_CARE_PROVIDER_SITE_OTHER): Payer: Medicare Other | Admitting: *Deleted

## 2010-12-20 DIAGNOSIS — I5032 Chronic diastolic (congestive) heart failure: Secondary | ICD-10-CM

## 2010-12-20 DIAGNOSIS — I495 Sick sinus syndrome: Secondary | ICD-10-CM

## 2010-12-20 LAB — PACEMAKER DEVICE OBSERVATION
AL THRESHOLD: 0.75 V
BAMS-0001: 175 {beats}/min
BATTERY VOLTAGE: 2.79 V
VENTRICULAR PACING PM: 100

## 2010-12-20 NOTE — Progress Notes (Signed)
Wound check-PPM 

## 2010-12-28 ENCOUNTER — Encounter: Payer: Self-pay | Admitting: Internal Medicine

## 2010-12-28 ENCOUNTER — Other Ambulatory Visit: Payer: Self-pay | Admitting: Internal Medicine

## 2010-12-28 ENCOUNTER — Other Ambulatory Visit: Payer: Self-pay | Admitting: *Deleted

## 2010-12-28 ENCOUNTER — Ambulatory Visit (INDEPENDENT_AMBULATORY_CARE_PROVIDER_SITE_OTHER): Payer: Medicare Other | Admitting: Internal Medicine

## 2010-12-28 ENCOUNTER — Other Ambulatory Visit: Payer: Medicare Other

## 2010-12-28 DIAGNOSIS — IMO0002 Reserved for concepts with insufficient information to code with codable children: Secondary | ICD-10-CM

## 2010-12-28 DIAGNOSIS — R8290 Unspecified abnormal findings in urine: Secondary | ICD-10-CM

## 2010-12-28 DIAGNOSIS — I10 Essential (primary) hypertension: Secondary | ICD-10-CM

## 2010-12-28 DIAGNOSIS — N390 Urinary tract infection, site not specified: Secondary | ICD-10-CM

## 2010-12-28 DIAGNOSIS — R309 Painful micturition, unspecified: Secondary | ICD-10-CM

## 2010-12-28 DIAGNOSIS — M5416 Radiculopathy, lumbar region: Secondary | ICD-10-CM

## 2010-12-28 LAB — POCT URINALYSIS DIPSTICK
Bilirubin, UA: NEGATIVE
Nitrite, UA: POSITIVE
Urobilinogen, UA: 0.2
pH, UA: 5

## 2010-12-28 MED ORDER — CIPROFLOXACIN HCL 500 MG PO TABS: 500.0000 mg | ORAL_TABLET | Freq: Two times a day (BID) | ORAL | Status: DC

## 2010-12-28 NOTE — Patient Instructions (Signed)
Your urine will be sent for culture today Please call the phone number [phone] (the PhoneTree System) for results of testing in 2-3 days;  When calling, simply dial the number, and when prompted enter the MRN number above (the Medical Record Number) and the # key, then the message should start. Take all new medications as prescribed - the antibiotic sent to the pharmacy Continue all other medications as before - the pain medication for the lower back Please return in 1 week to see Dr Felicity Coyer, or sooner if needed

## 2010-12-28 NOTE — Assessment & Plan Note (Signed)
stable overall by hx and exam, most recent data reviewed with pt, and pt to continue medical treatment as before  BP Readings from Last 3 Encounters:  12/28/10 132/62  12/04/10 120/60  11/28/10 143/73

## 2010-12-28 NOTE — Op Note (Signed)
  [name], [name]                 ACCOUNT NO.:  [id number]  MEDICAL RECORD NO.:  [id number]  LOCATION:                                 FACILITY:  PHYSICIAN:  Doylene Canning. Ladona Ridgel, MD    DATE OF BIRTH:  02-Dec-1932  DATE OF PROCEDURE: DATE OF DISCHARGE:                              OPERATIVE REPORT   ADDENDUM  The initial portion of her procedure note was terminated prematurely and inadvertently.  The rest of the procedure should read as follows:  After usual preparation and draping, the patient was given 30 mL of lidocaine.  Of note, fentanyl and Versed were not given at the patient's request.  A 5-cm incision was carried out over the old pacemaker insertion site.  Electrocautery was utilized to dissect down to the fascial plane.  The pacemaker pocket was entered and the generator was removed without difficulty.  The atrial and ventricular leads were evaluated and found to be working satisfactorily.  The new Medtronic Sensia dual-chamber pacemaker, serial number [id number] was connected to the atrial and ventricular leads and placed back in the subcutaneous pocket.  The pocket was irrigated with antibiotic irrigation and the incision was closed with 2-0 and 3-0 Vicryl.  Benzoin and Steri-Strips were painted on the skin, a pressure dressing was applied, and the patient was returned to her room in satisfactory condition.  COMPLICATIONS:  There were no immediate procedure complications.  RESULTS:  This demonstrate successful removal of previously implanted Medtronic dual-chamber pacemaker with successful pacemaker lead interrogation with successful insertion of a new dual-chamber pacemaker.     Doylene Canning. Ladona Ridgel, MD     GWT/MEDQ  D:  12/07/2010  T:  12/08/2010  Job:  [id number]  Electronically Signed by Lewayne Bunting MD on 12/28/2010 08:35:37 AM

## 2010-12-28 NOTE — Assessment & Plan Note (Signed)
Allergy to PCN, for urine cx and tx with cipro asd; f/u cx results

## 2010-12-28 NOTE — Assessment & Plan Note (Signed)
New onset pain x 3-4 days, incidental to UTI, cant have MRI as just had pacemaker jun 19, cannot travel to see former ortho at [facility] b/c too far for her now, and states cannot see local ortho (since none in the "[facility] network" that will allow payments of 100% copay over time); to cont pain med for now, avoid predpack due to current UTI, and ask pt to f/u with Dr Felicity Coyer in 1 wk

## 2010-12-28 NOTE — Progress Notes (Signed)
Subjective:    Patient ID: [name] [name], female    DOB: 1933/05/08, 77 y.o.   MRN: [id number]  HPI  Here with acute onset GU symtpoms of low grade temp, marked dysuria and freq for 2-3 days.  Without flank pain, vomiting, though feels generally weak and nausea.  Pt denies chest pain, increased sob or doe, wheezing, orthopnea, PND, increased LE swelling, palpitations, dizziness or syncope.  Pt denies new neurological symptoms such as new headache, or facial or extremity weakness or numbness   Pt denies polydipsia, polyuria.  Incidentally with new onset 3-4 days left  LBP without change in severity, mod, persistent, no bowel or bladder change, wt loss,  worsening LE pain/numbness/weakness, gait change or falls except for mild LLE pain to the left buttock and post thigh and some distal LLE weakness?Marland Kitchen Not worse to postural change Past Medical History  Diagnosis Date  . Sinoatrial node dysfunction   . Cardiac pacemaker in situ 2003  . Irritable bowel syndrome (IBS)   . ANXIETY   . CHF (congestive heart failure)   . COPD   . HYPERLIPIDEMIA   . HYPERTENSION   . HYPOTHYROIDISM   . GERD     with HH  . OSTEOARTHRITIS, KNEES, BILATERAL     B TKR  . VITAMIN B12 DEFICIENCY   . HELICOBACTER PYLORI GASTRITIS, HX OF 02/2009  . Chronic diastolic heart failure   . Carpal tunnel syndrome   . PERIPHERAL NEUROPATHY    Past Surgical History  Procedure Date  . Bilateral tka   . Nasal fracture surgery   . Cholecystectomy   . Abdominal hysterectomy   . Hemorrhoid surgery   . Carpal tunnel release   . Thyroidectomy 1980  . Bladder repair   . Cardiac pacemaker placement 2003    medtronic, dual chamber    reports that she has never smoked. She does not have any smokeless tobacco history on file. She reports that she does not drink alcohol or use illicit drugs. family history includes COPD in her father. Allergies  Allergen Reactions  . Penicillins    Current Outpatient Prescriptions on File Prior to  Visit  Medication Sig Dispense Refill  . albuterol-ipratropium (COMBIVENT) 18-103 MCG/ACT inhaler Inhale 2 puffs into the lungs every 6 (six) hours as needed.        . ALPRAZolam (XANAX) 0.5 MG tablet Take 1 tablet (0.5 mg total) by mouth 2 (two) times daily.  60 tablet  1  . amLODipine (NORVASC) 10 MG tablet Take 10 mg by mouth daily.        Marland Kitchen aspirin 81 MG tablet Take 81 mg by mouth daily.        . benazepril (LOTENSIN) 20 MG tablet Take 20 mg by mouth daily.        . Cholecalciferol (VITAMIN D3) 1000 UNITS CAPS Take by mouth daily.        . cyanocobalamin (,VITAMIN B-12,) 1000 MCG/ML injection Inject 1,000 mcg into the muscle every 30 (thirty) days.        . furosemide (LASIX) 40 MG tablet Take 1 tablet (40 mg total) by mouth daily. Or as directed for swelling  40 tablet  6  . gabapentin (NEURONTIN) 300 MG capsule Take 300 mg by mouth at bedtime.        Marland Kitchen glimepiride (AMARYL) 2 MG tablet Take 2 mg by mouth daily before breakfast.        . HYDROcodone-acetaminophen (VICODIN) 5-500 MG per tablet Take 1/2-1 tablet every  8 hours as needed for pain  30 tablet  1  . insulin NPH (HUMULIN N) 100 UNIT/ML injection Inject 25 Units into the skin 2 (two) times daily before a meal.  10 mL  11  . Lancets MISC       . levothyroxine (SYNTHROID, LEVOTHROID) 200 MCG tablet Take 200 mcg by mouth daily.        Marland Kitchen levothyroxine (SYNTHROID, LEVOTHROID) 25 MCG tablet Take 25 mcg by mouth daily. Take in addition to       . metoprolol (TOPROL XL) 50 MG 24 hr tablet Take 50 mg by mouth daily.        . nitroGLYCERIN (NITROLINGUAL) 0.4 MG/SPRAY spray Place 1 spray under the tongue every 5 (five) minutes as needed.        . nystatin-triamcinolone (MYCOLOG II) cream Apply topically 3 (three) times daily as needed.  60 g  0  . omeprazole (PRILOSEC) 20 MG capsule Take 20 mg by mouth daily.        . potassium chloride SA (K-DUR,KLOR-CON) 20 MEQ tablet Take 20 mEq by mouth daily.        . pravastatin (PRAVACHOL) 80 MG  tablet Take 80 mg by mouth at bedtime.        . solifenacin (VESICARE) 5 MG tablet Take 10 mg by mouth daily.        . SYRINGE-NEEDLE, DISP, 3 ML (B-D 3CC LUER-LOK SYR 23GX1-1/4) 23G X 1-1/4" 3 ML MISC by Does not apply route every 30 (thirty) days. To dispense B12 monthly       . triamcinolone (KENALOG) 0.1 % lotion Apply topically 2 (two) times daily.        . VENTOLIN HFA 108 (90 BASE) MCG/ACT inhaler INHALE 2 PUFFS 4 TIMES A DAY AS DIRECTED  18 g  6  . vitamin E (VITAMIN E) 400 UNIT capsule Take 400 Units by mouth daily.         Review of Systems Review of Systems  Constitutional: Negative for diaphoresis and unexpected weight change.  HENT: Negative for drooling and tinnitus.   Eyes: Negative for photophobia and visual disturbance.  Respiratory: Negative for choking and stridor.   Gastrointestinal: Negative for vomiting and blood in stool.  Genitourinary: Negative for hematuria and decreased urine volume.       Objective:   Physical Exam BP 132/62  Pulse 85  Temp(Src) 97.9 F (36.6 C) (Oral)  Ht 5\' 10"  (1.778 m)  Wt 260 lb 8 oz (118.162 kg)  BMI 37.38 kg/m2  SpO2 97% Physical Exam  VS noted Constitutional: Pt appears well-developed and well-nourished.  HENT: Head: Normocephalic.  Right Ear: External ear normal.  Left Ear: External ear normal.  Eyes: Conjunctivae and EOM are normal. Pupils are equal, round, and reactive to light.  Neck: Normal range of motion. Neck supple.  Cardiovascular: Normal rate and regular rhythm.   Pulmonary/Chest: Effort normal and breath sounds normal.  Abd:  Soft, non-distended, + BS, mild low mid abd tender without guarding Neurological: Pt is alert. No cranial nerve deficit. motor intact except for LLE distal 4+/5 Skin: Skin is warm. No erythema.  Psychiatric: Pt behavior is normal. Thought content normal.         Assessment & Plan:

## 2010-12-28 NOTE — Op Note (Signed)
  NAMEMCKAYLEE, DIMALANTA                 ACCOUNT NO.:  [id number]  MEDICAL RECORD NO.:  [id number]  LOCATION:  [facility]                         FACILITY:  [facility]  PHYSICIAN:  Doylene Canning. Ladona Ridgel, MD    DATE OF BIRTH:  1932/10/04  DATE OF PROCEDURE:  12/07/2010 DATE OF DISCHARGE:  12/07/2010                              OPERATIVE REPORT   PROCEDURE PERFORMED:  Removal of previously implanted dual-chamber pacemaker and insertion of a new dual-chamber pacemaker.  INDICATION:  Symptomatic complete heart block status post pacemaker with her current device at elective replacement indication.  INTRODUCTION:  The patient is a 77 year old woman with history of symptomatic bradycardia and complete heart block.  She is status post pacemaker insertion.  She has had pacemaker generator ERI and she is now referred for generator change.  PROCEDURE:  After informed was obtained, the patient was taken to the diagnostic EP lab in fasting state.  After usual preparation and draping, intravenous fentanyl and midazolam was given for sedation.  30 mL of lidocaine   Dictation ended at this point.     Doylene Canning. Ladona Ridgel, MD     GWT/MEDQ  D:  12/07/2010  T:  12/08/2010  Job:  [id number]  Electronically Signed by Lewayne Bunting MD on 12/28/2010 08:35:31 AM

## 2011-01-01 ENCOUNTER — Telehealth: Payer: Self-pay | Admitting: Internal Medicine

## 2011-01-01 LAB — CULTURE, URINE COMPREHENSIVE: Colony Count: >=100000

## 2011-01-01 MED ORDER — NITROFURANTOIN MONOHYD MACRO 100 MG PO CAPS: 100.0000 mg | ORAL_CAPSULE | Freq: Two times a day (BID) | ORAL | Status: AC

## 2011-01-01 NOTE — Telephone Encounter (Signed)
Pt advised in detail of ABX change and pharmacy

## 2011-01-01 NOTE — Telephone Encounter (Signed)
Urine cx showed E coli, but cipro resistant  OK to change to nitrofurantoin course  Dahlia to notify pt, needs to change antibx

## 2011-01-02 ENCOUNTER — Other Ambulatory Visit: Payer: Self-pay | Admitting: Internal Medicine

## 2011-01-02 NOTE — Telephone Encounter (Signed)
Faxed script back to [facility]...01/02/11@1 :54pm/lmb

## 2011-01-03 ENCOUNTER — Ambulatory Visit: Payer: Medicare Other | Admitting: Internal Medicine

## 2011-01-10 ENCOUNTER — Other Ambulatory Visit (INDEPENDENT_AMBULATORY_CARE_PROVIDER_SITE_OTHER): Payer: Medicare Other

## 2011-01-10 ENCOUNTER — Ambulatory Visit (INDEPENDENT_AMBULATORY_CARE_PROVIDER_SITE_OTHER): Payer: Medicare Other | Admitting: Internal Medicine

## 2011-01-10 ENCOUNTER — Encounter: Payer: Self-pay | Admitting: Internal Medicine

## 2011-01-10 DIAGNOSIS — Z794 Long term (current) use of insulin: Secondary | ICD-10-CM

## 2011-01-10 DIAGNOSIS — E119 Type 2 diabetes mellitus without complications: Secondary | ICD-10-CM

## 2011-01-10 DIAGNOSIS — I10 Essential (primary) hypertension: Secondary | ICD-10-CM

## 2011-01-10 DIAGNOSIS — E785 Hyperlipidemia, unspecified: Secondary | ICD-10-CM

## 2011-01-10 DIAGNOSIS — G4700 Insomnia, unspecified: Secondary | ICD-10-CM

## 2011-01-10 LAB — HEMOGLOBIN A1C: Hgb A1c MFr Bld: 7.5 % — ABNORMAL HIGH (ref 4.6–6.5)

## 2011-01-10 MED ORDER — ZOLPIDEM TARTRATE 5 MG PO TABS: 5.0000 mg | ORAL_TABLET | Freq: Every evening | ORAL | Status: DC | PRN

## 2011-01-10 MED ORDER — AMLODIPINE BESY-BENAZEPRIL HCL 10-20 MG PO CAPS: 1.0000 | ORAL_CAPSULE | Freq: Every day | ORAL | Status: DC

## 2011-01-10 NOTE — Progress Notes (Signed)
  Subjective:    Patient ID: [name] [name], female    DOB: 05-Jun-1933, 77 y.o.   MRN: [id number]  HPI  Here for follow up -reviewed chronic medical issues:  HTN - reports compliance with ongoing medical treatment; recent changes in medication reviewed - prev off amlodipine in lotrel due to edema - then resumed due to intolerance of dilt - the patient reports compliance with medication(s) as prescribed. Denies adverse side effects.  hypothyroid - reports compliance with ongoing medical treatment; no changes in medication dose or frequency. denies adverse side effects related to current therapy.  DM2 - on insulin and oral meds - reports compliance with ongoing medical treatment; interval changes in medication reviewed - off actos due to fear SE, on generic amaryl and insulin. denies adverse side effects related to current therapy. no hypoglycemia symptoms or events - checks cbg 2x/d before meals -   dyslipidemia - dose statin increased by cards 07/10/10 - pt remains skeptical about meds and fearful of poss SE but taking as rx'd without problem at this time - improved FLP in 08/2010 at recheck  Past Medical History  Diagnosis Date  . Sinoatrial node dysfunction   . Cardiac pacemaker in situ 2003  . Irritable bowel syndrome (IBS)   . ANXIETY   . CHF (congestive heart failure)   . COPD   . HYPERLIPIDEMIA   . HYPERTENSION   . HYPOTHYROIDISM   . GERD     with HH  . OSTEOARTHRITIS, KNEES, BILATERAL     B TKR  . VITAMIN B12 DEFICIENCY   . HELICOBACTER PYLORI GASTRITIS, HX OF 02/2009  . Chronic diastolic heart failure   . Carpal tunnel syndrome   . PERIPHERAL NEUROPATHY     Review of Systems  Cardiovascular: Negative for chest pain.  Gastrointestinal: Positive for constipation. Negative for vomiting and abdominal pain.  Genitourinary: Negative for dysuria and difficulty urinating.  Neurological: Negative for weakness and headaches.       Objective:   Physical Exam BP 130/68  Pulse  76  Temp(Src) 97.9 F (36.6 C) (Oral)  Ht 5\' 10"  (1.778 m)  Wt 257 lb 6.4 oz (116.756 kg)  BMI 36.93 kg/m2  SpO2 95%  Constitutional: She is obese; oriented to person, place, and time. She appears well-developed and well-nourished. No distress. spouse at side Neck: Normal range of motion. Neck supple. No JVD present. No thyromegaly present.  Cardiovascular: Normal rate, regular rhythm and normal heart sounds.  No murmur heard. No BLE edema. Pulmonary/Chest: Effort normal and breath sounds normal. No respiratory distress. She has no wheezes.  Psychiatric: She has a normal mood and affect. Her behavior is normal. Judgment and thought content normal.      Lab Results  Component Value Date   WBC 6.5 12/04/2010   HGB 14.1 12/04/2010   HCT 43.1 12/04/2010   PLT 256 12/04/2010   CHOL 158 08/25/2010   TRIG 115 08/25/2010   HDL 48 08/25/2010   ALT 27 09/17/8117   AST 33 12/18/2009   NA 138 12/04/2010   K 5.1 12/04/2010   CL 102 12/04/2010   CREATININE 1.05 12/04/2010   BUN 12 12/04/2010   CO2 26 12/04/2010   TSH 0.61 10/30/2010   INR 1.07 12/04/2010   HGBA1C 8.0* 10/30/2010   MICROALBUR 2.7* 08/29/2009    Assessment & Plan:  See problem list. Medications and labs reviewed today.

## 2011-01-10 NOTE — Assessment & Plan Note (Addendum)
stable overall by hx and exam,  Pt requests fewer pills - will combine amlodipine and ACEI as before  BP Readings from Last 3 Encounters:  01/10/11 130/68  12/28/10 132/62  12/04/10 120/60

## 2011-01-10 NOTE — Assessment & Plan Note (Signed)
Well controlled by hx review -  check a1c now and adjust meds as needed Lab Results  Component Value Date   HGBA1C 8.0* 10/30/2010

## 2011-01-10 NOTE — Patient Instructions (Signed)
It was good to see you today. Combine amlodipine and benazapril into one pill next refill on your blood pressure medications - stop the individual medicines when you start the combo pill Try ambein for sleep - Your prescription(s) have been submitted to your pharmacy. Please take as directed and contact our office if you believe you are having problem(s) with the medication(s). Other Medications reviewed, no changes at this time. Test(s) ordered today. Your results will be called to you after review (48-72hours after test completion). If any changes need to be made, you will be notified at that time. Please schedule followup in 3-4 months for diabetes check and cholesterol, call sooner if problems.

## 2011-01-10 NOTE — Assessment & Plan Note (Signed)
Dose statin increased 06/2010 - improved on f/u labs 08/2010 The current medical regimen is effective;  continue present plan and medications.

## 2011-01-16 ENCOUNTER — Other Ambulatory Visit: Payer: Self-pay | Admitting: Internal Medicine

## 2011-01-24 ENCOUNTER — Ambulatory Visit: Payer: Medicare Other | Admitting: Internal Medicine

## 2011-01-31 ENCOUNTER — Other Ambulatory Visit: Payer: Self-pay | Admitting: Internal Medicine

## 2011-02-05 ENCOUNTER — Telehealth: Payer: Self-pay

## 2011-02-05 MED ORDER — FUROSEMIDE 40 MG PO TABS: 40.0000 mg | ORAL_TABLET | Freq: Two times a day (BID) | ORAL | Status: DC | PRN

## 2011-02-05 NOTE — Telephone Encounter (Signed)
Noted and agree thanks

## 2011-02-05 NOTE — Telephone Encounter (Signed)
Pt called requesting to take Diuretic bid rather than qd. Pt advised per VAL to take twice daily only if necessary. Pt understood and Rx update to prevent insufficient meds.

## 2011-02-07 ENCOUNTER — Ambulatory Visit: Payer: Medicare Other | Admitting: Internal Medicine

## 2011-02-07 DIAGNOSIS — Z029 Encounter for administrative examinations, unspecified: Secondary | ICD-10-CM

## 2011-02-09 ENCOUNTER — Encounter: Payer: Self-pay | Admitting: Cardiology

## 2011-02-13 ENCOUNTER — Ambulatory Visit: Payer: Medicare Other | Admitting: Cardiology

## 2011-02-21 ENCOUNTER — Other Ambulatory Visit: Payer: Self-pay | Admitting: *Deleted

## 2011-02-21 ENCOUNTER — Other Ambulatory Visit: Payer: Self-pay | Admitting: Internal Medicine

## 2011-02-21 MED ORDER — HYDROCODONE-ACETAMINOPHEN 5-500 MG PO TABS: ORAL_TABLET | ORAL | Status: DC

## 2011-02-21 NOTE — Telephone Encounter (Signed)
Faxed script back to [facility] pharmacy @ [phone].Marland KitchenMarland Kitchen9/12/12@1 :02pm/LMB

## 2011-03-07 ENCOUNTER — Telehealth: Payer: Self-pay | Admitting: *Deleted

## 2011-03-07 NOTE — Telephone Encounter (Signed)
Left vm sent over order for renewal of pt diabetes test strips/supplies. Requesting call bck. Called back spoke with rep/Stephanie verified fax # was not correct #. Jovita Gamma [phone] so she is resending fax...03/07/11@10 :30am/LMB

## 2011-03-12 ENCOUNTER — Encounter: Payer: Self-pay | Admitting: Cardiology

## 2011-03-13 ENCOUNTER — Encounter: Payer: Self-pay | Admitting: Internal Medicine

## 2011-03-14 ENCOUNTER — Encounter: Payer: Medicare Other | Admitting: Internal Medicine

## 2011-03-15 ENCOUNTER — Ambulatory Visit: Payer: Medicare Other | Admitting: Cardiology

## 2011-03-16 LAB — KOH PREP

## 2011-03-26 ENCOUNTER — Other Ambulatory Visit: Payer: Self-pay | Admitting: Internal Medicine

## 2011-03-26 DIAGNOSIS — I10 Essential (primary) hypertension: Secondary | ICD-10-CM

## 2011-03-26 MED ORDER — FUROSEMIDE 40 MG PO TABS: 40.0000 mg | ORAL_TABLET | Freq: Two times a day (BID) | ORAL | Status: DC | PRN

## 2011-03-26 MED ORDER — GABAPENTIN 300 MG PO CAPS: 300.0000 mg | ORAL_CAPSULE | Freq: Every day | ORAL | Status: DC

## 2011-03-26 MED ORDER — AMLODIPINE BESY-BENAZEPRIL HCL 10-20 MG PO CAPS: 1.0000 | ORAL_CAPSULE | Freq: Every day | ORAL | Status: DC

## 2011-03-26 MED ORDER — POTASSIUM CHLORIDE CRYS ER 20 MEQ PO TBCR: 20.0000 meq | EXTENDED_RELEASE_TABLET | Freq: Every day | ORAL | Status: DC

## 2011-03-26 MED ORDER — INSULIN NPH (HUMAN) (ISOPHANE) 100 UNIT/ML ~~LOC~~ SUSP: 20.0000 [IU] | Freq: Two times a day (BID) | SUBCUTANEOUS | Status: DC

## 2011-03-26 NOTE — Telephone Encounter (Signed)
Done per emr 

## 2011-03-27 ENCOUNTER — Other Ambulatory Visit: Payer: Self-pay | Admitting: *Deleted

## 2011-03-30 ENCOUNTER — Encounter: Payer: Self-pay | Admitting: Cardiology

## 2011-04-02 ENCOUNTER — Other Ambulatory Visit: Payer: Self-pay | Admitting: *Deleted

## 2011-04-02 DIAGNOSIS — G4700 Insomnia, unspecified: Secondary | ICD-10-CM

## 2011-04-02 MED ORDER — HYDROCODONE-ACETAMINOPHEN 5-500 MG PO TABS: ORAL_TABLET | ORAL | Status: DC

## 2011-04-02 MED ORDER — ZOLPIDEM TARTRATE 5 MG PO TABS: 5.0000 mg | ORAL_TABLET | Freq: Every evening | ORAL | Status: DC | PRN

## 2011-04-02 NOTE — Telephone Encounter (Signed)
90d rx done for both

## 2011-04-02 NOTE — Telephone Encounter (Signed)
Faxed scripts back to [facility] pharmacy @ [phone]...04/02/11@1 :13pm/LMB

## 2011-04-02 NOTE — Telephone Encounter (Signed)
Received fax from pharmacy. Pt is requesting a 90 day supply on her zolpidem & hydrocodone. Plas advise...04/02/11@10 :12am/LMB

## 2011-04-05 ENCOUNTER — Ambulatory Visit (INDEPENDENT_AMBULATORY_CARE_PROVIDER_SITE_OTHER): Payer: Medicare Other | Admitting: Cardiology

## 2011-04-05 ENCOUNTER — Encounter: Payer: Self-pay | Admitting: Cardiology

## 2011-04-05 DIAGNOSIS — E039 Hypothyroidism, unspecified: Secondary | ICD-10-CM

## 2011-04-05 DIAGNOSIS — J449 Chronic obstructive pulmonary disease, unspecified: Secondary | ICD-10-CM

## 2011-04-05 DIAGNOSIS — Z86010 Personal history of colonic polyps: Secondary | ICD-10-CM

## 2011-04-05 DIAGNOSIS — I5032 Chronic diastolic (congestive) heart failure: Secondary | ICD-10-CM

## 2011-04-05 DIAGNOSIS — I679 Cerebrovascular disease, unspecified: Secondary | ICD-10-CM

## 2011-04-05 DIAGNOSIS — G4700 Insomnia, unspecified: Secondary | ICD-10-CM

## 2011-04-05 DIAGNOSIS — E785 Hyperlipidemia, unspecified: Secondary | ICD-10-CM

## 2011-04-05 DIAGNOSIS — I2510 Atherosclerotic heart disease of native coronary artery without angina pectoris: Secondary | ICD-10-CM

## 2011-04-05 DIAGNOSIS — G609 Hereditary and idiopathic neuropathy, unspecified: Secondary | ICD-10-CM

## 2011-04-05 DIAGNOSIS — I495 Sick sinus syndrome: Secondary | ICD-10-CM | POA: Insufficient documentation

## 2011-04-05 DIAGNOSIS — I10 Essential (primary) hypertension: Secondary | ICD-10-CM

## 2011-04-05 DIAGNOSIS — E1143 Type 2 diabetes mellitus with diabetic autonomic (poly)neuropathy: Secondary | ICD-10-CM | POA: Insufficient documentation

## 2011-04-05 DIAGNOSIS — Z950 Presence of cardiac pacemaker: Secondary | ICD-10-CM

## 2011-04-05 DIAGNOSIS — IMO0002 Reserved for concepts with insufficient information to code with codable children: Secondary | ICD-10-CM

## 2011-04-05 DIAGNOSIS — E669 Obesity, unspecified: Secondary | ICD-10-CM

## 2011-04-05 DIAGNOSIS — E538 Deficiency of other specified B group vitamins: Secondary | ICD-10-CM

## 2011-04-05 DIAGNOSIS — M1710 Unilateral primary osteoarthritis, unspecified knee: Secondary | ICD-10-CM

## 2011-04-05 DIAGNOSIS — K219 Gastro-esophageal reflux disease without esophagitis: Secondary | ICD-10-CM | POA: Insufficient documentation

## 2011-04-05 DIAGNOSIS — F411 Generalized anxiety disorder: Secondary | ICD-10-CM

## 2011-04-05 NOTE — Patient Instructions (Signed)
Your physician recommends that you schedule a follow-up appointment in: 1 year with Dr Dietrich Pates.  You will be sent reminder letter.

## 2011-04-06 DIAGNOSIS — G4700 Insomnia, unspecified: Secondary | ICD-10-CM | POA: Insufficient documentation

## 2011-04-06 NOTE — Assessment & Plan Note (Signed)
Patient has no symptoms at present attributable to coronary artery disease and excellent control of cardiovascular risk factors.

## 2011-04-06 NOTE — Assessment & Plan Note (Signed)
Lipid profile results were good 7 months ago.

## 2011-04-06 NOTE — Assessment & Plan Note (Signed)
Obesity has been a chronic problem.  At least weight is stable since previous visit 4 months ago.  Patient has been unable to restrict calories adequately to lose a significant amount of weight.

## 2011-04-06 NOTE — Assessment & Plan Note (Addendum)
Carotid ultrasound 7 months ago demonstrated some progression of disease with a mild right internal carotid artery stenosis.  A followup study will be obtained within the year.

## 2011-04-06 NOTE — Progress Notes (Signed)
HPI : Ms. Twichell returns to the office as scheduled for continued assessment and treatment of coronary disease and sick sinus syndrome.  Since her last visit, she has done generally well.  She denies dizziness, syncope, chest discomfort, dyspnea, orthopnea or PND.  She has had no new medical problems, but underwent elective replacement of her pacemaker generator as an outpatient a few months ago.  She does not follow her blood pressures at home.  She complains of difficulty falling and staying asleep.  Recent addition of Ambien to her medications has been unsuccessful.  Current Outpatient Prescriptions on File Prior to Visit  Medication Sig Dispense Refill  . albuterol-ipratropium (COMBIVENT) 18-103 MCG/ACT inhaler Inhale 2 puffs into the lungs every 6 (six) hours as needed.        . AMARYL 2 MG tablet TAKE 1 TABLET DAILY IN THE MORNING FOR DIABETES  90 each  3  . amLODipine-benazepril (LOTREL) 10-20 MG per capsule Take 1 capsule by mouth daily.  90 capsule  1  . aspirin 81 MG tablet Take 81 mg by mouth daily.        . cyanocobalamin (,VITAMIN B-12,) 1000 MCG/ML injection Inject 1,000 mcg into the muscle every 30 (thirty) days.        . furosemide (LASIX) 40 MG tablet Take 40 mg by mouth daily. Twice daily only if needed       . gabapentin (NEURONTIN) 300 MG capsule Take 1 capsule (300 mg total) by mouth at bedtime.  90 capsule  1  . HYDROcodone-acetaminophen (VICODIN) 5-500 MG per tablet Take 1/2-1 tablet every 8 hours as needed for pain  120 tablet  1  . insulin NPH (HUMULIN N) 100 UNIT/ML injection Inject 20 Units into the skin 2 (two) times daily before a meal.  40 mL  1  . Lancets MISC       . LEVOXYL 200 MCG tablet TAKE (1) TABLET DAILY FOR THYROID.  90 each  3  . LEVOXYL 25 MCG tablet TAKE 1 TABLET DAILY AS DIRECTED WITH  90 each  3  . LOTENSIN 20 MG tablet TAKE (1) TABLET DAILY FOR HIGH BLOOD PRESSURE.  90 each  3  . nitroGLYCERIN (NITROLINGUAL) 0.4 MG/SPRAY spray Place 1 spray under  the tongue every 5 (five) minutes as needed.        . nystatin-triamcinolone (MYCOLOG II) cream Apply topically 3 (three) times daily as needed.  60 g  0  . potassium chloride SA (KLOR-CON M20) 20 MEQ tablet Take 1 tablet (20 mEq total) by mouth daily.  90 tablet  1  . PRAVACHOL 80 MG tablet TAKE 1 TABLET AT BEDTIME FOR CHOLESTEROL  90 each  2  . PRILOSEC 20 MG capsule TAKE 1 CAPSULE ONCE A DAY FOR STOMACH  90 each  2  . solifenacin (VESICARE) 5 MG tablet Take 10 mg by mouth daily.        . SYRINGE-NEEDLE, DISP, 3 ML (B-D 3CC LUER-LOK SYR 23GX1-1/4) 23G X 1-1/4" 3 ML MISC by Does not apply route every 30 (thirty) days. To dispense B12 monthly       . TOPROL XL 50 MG 24 hr tablet TAKE 1 TABLET ONCE A DAY FOR HIGH BLOOD PRESSURE  90 each  3  . triamcinolone (KENALOG) 0.1 % lotion Apply topically 2 (two) times daily.        . VENTOLIN HFA 108 (90 BASE) MCG/ACT inhaler INHALE 2 PUFFS 4 TIMES A DAY AS DIRECTED  18 g  6  . vitamin E 100 UNIT capsule Take 100 Units by mouth daily.        Marland Kitchen XANAX 0.5 MG tablet TAKE  (1)  TABLET TWICE A DAY.  60 each  6  . zolpidem (AMBIEN) 5 MG tablet Take 1 tablet (5 mg total) by mouth at bedtime as needed for sleep.  90 tablet  1     Allergies  Allergen Reactions  . Penicillins       Past medical history, social history, and family history reviewed and updated.  ROS: See history of present illness  PHYSICAL EXAM: BP 140/78  Pulse 80  Resp 18  Ht 5\' 10"  (1.778 m)  Wt 258 lb (117.028 kg)  BMI 37.02 kg/m2  General-Well developed; no acute distress Body habitus-obese Neck-No JVD; modest bilateral carotid bruits Lungs-clear lung fields; resonant to percussion Cardiovascular-normal PMI; normal S1 and S2; minimal systolic ejection murmur Abdomen-normal bowel sounds; soft and non-tender without masses or organomegaly Musculoskeletal-No deformities, no cyanosis or clubbing Neurologic-Normal cranial nerves; symmetric strength and tone Skin-Warm, no significant  lesions Extremities-distal pulses intact; 1+ edema  ASSESSMENT AND PLAN:

## 2011-04-06 NOTE — Assessment & Plan Note (Signed)
Difficulty falling and staying asleep; History of positive sleep study for obstructive sleep apnea, but patient does not tolerate positive pressure device.  Treatment with benzodiazepines has not been effective and is not desirable in this older woman.  Dr. Felicity Coyer may wish to consider referral to a sleep specialist.

## 2011-04-06 NOTE — Assessment & Plan Note (Addendum)
Recent generator change.  Future appointment with Dr. Ladona Ridgel will be verified.

## 2011-04-06 NOTE — Assessment & Plan Note (Signed)
Blood pressure adequately controlled with current regimen, which will be continued.

## 2011-04-06 NOTE — Assessment & Plan Note (Signed)
No recent assessment of left ventricular systolic function, but no manifestations of congestive heart failure at present.

## 2011-04-11 ENCOUNTER — Encounter: Payer: Self-pay | Admitting: Cardiology

## 2011-04-11 NOTE — Telephone Encounter (Signed)
No telephone call-opened in error

## 2011-05-07 ENCOUNTER — Other Ambulatory Visit: Payer: Self-pay | Admitting: Internal Medicine

## 2011-05-07 ENCOUNTER — Ambulatory Visit (INDEPENDENT_AMBULATORY_CARE_PROVIDER_SITE_OTHER): Payer: Medicare Other | Admitting: Internal Medicine

## 2011-05-07 ENCOUNTER — Encounter: Payer: Self-pay | Admitting: Internal Medicine

## 2011-05-07 DIAGNOSIS — I10 Essential (primary) hypertension: Secondary | ICD-10-CM

## 2011-05-07 DIAGNOSIS — Z950 Presence of cardiac pacemaker: Secondary | ICD-10-CM

## 2011-05-07 DIAGNOSIS — I495 Sick sinus syndrome: Secondary | ICD-10-CM

## 2011-05-07 LAB — PACEMAKER DEVICE OBSERVATION
AL AMPLITUDE: 5.6 mv
AL IMPEDENCE PM: 455 Ohm
AL THRESHOLD: 0.5 V
ATRIAL PACING PM: 60
BAMS-0001: 175 {beats}/min
BATTERY VOLTAGE: 2.78 V
RV LEAD IMPEDENCE PM: 648 Ohm
RV LEAD THRESHOLD: 1 V
VENTRICULAR PACING PM: 100

## 2011-05-07 NOTE — Assessment & Plan Note (Signed)
Her device is working normally. I have asked her to put some hydrocortisone cream at her PPM insertion site.

## 2011-05-07 NOTE — Progress Notes (Signed)
HPI [name] [name] returns today for followup. She is a pleasant 77 yo woman with CHB, HTN, and DM. She is s/p PPM. She denies c/p, sob, or syncope. She has had some difficulty sleeping at night. She notes some discomfort at her PPM insertion site. No other complaints. Allergies  Allergen Reactions  . Penicillins      Current Outpatient Prescriptions  Medication Sig Dispense Refill  . albuterol-ipratropium (COMBIVENT) 18-103 MCG/ACT inhaler Inhale 2 puffs into the lungs every 6 (six) hours as needed.        . AMARYL 2 MG tablet TAKE 1 TABLET DAILY IN THE MORNING FOR DIABETES  90 each  3  . amLODipine-benazepril (LOTREL) 10-20 MG per capsule Take 1 capsule by mouth daily.  90 capsule  1  . aspirin 81 MG tablet Take 81 mg by mouth daily.        . cyanocobalamin (,VITAMIN B-12,) 1000 MCG/ML injection Inject 1,000 mcg into the muscle every 30 (thirty) days.        . furosemide (LASIX) 40 MG tablet Take 40 mg by mouth daily. Twice daily only if needed       . gabapentin (NEURONTIN) 300 MG capsule Take 1 capsule (300 mg total) by mouth at bedtime.  90 capsule  1  . HYDROcodone-acetaminophen (VICODIN) 5-500 MG per tablet Take 1/2-1 tablet every 8 hours as needed for pain  120 tablet  1  . insulin NPH (HUMULIN N) 100 UNIT/ML injection Inject 20 Units into the skin 2 (two) times daily before a meal.  40 mL  1  . Lancets MISC       . LEVOXYL 200 MCG tablet TAKE (1) TABLET DAILY FOR THYROID.  90 each  3  . LEVOXYL 25 MCG tablet TAKE 1 TABLET DAILY AS DIRECTED WITH  90 each  3  . LOTENSIN 20 MG tablet TAKE (1) TABLET DAILY FOR HIGH BLOOD PRESSURE.  90 each  3  . nitroGLYCERIN (NITROLINGUAL) 0.4 MG/SPRAY spray Place 1 spray under the tongue every 5 (five) minutes as needed.        . nystatin-triamcinolone (MYCOLOG II) cream Apply topically 3 (three) times daily as needed.  60 g  0  . potassium chloride SA (KLOR-CON M20) 20 MEQ tablet Take 1 tablet (20 mEq total) by mouth daily.  90 tablet  1  .  PRAVACHOL 80 MG tablet TAKE 1 TABLET AT BEDTIME FOR CHOLESTEROL  90 each  2  . PRILOSEC 20 MG capsule TAKE 1 CAPSULE ONCE A DAY FOR STOMACH  90 each  2  . solifenacin (VESICARE) 5 MG tablet Take 10 mg by mouth daily.        . SYRINGE-NEEDLE, DISP, 3 ML (B-D 3CC LUER-LOK SYR 23GX1-1/4) 23G X 1-1/4" 3 ML MISC by Does not apply route every 30 (thirty) days. To dispense B12 monthly       . TOPROL XL 50 MG 24 hr tablet TAKE 1 TABLET ONCE A DAY FOR HIGH BLOOD PRESSURE  90 each  3  . triamcinolone (KENALOG) 0.1 % lotion Apply topically 2 (two) times daily.        . VENTOLIN HFA 108 (90 BASE) MCG/ACT inhaler INHALE 2 PUFFS 4 TIMES A DAY AS DIRECTED  18 g  6  . vitamin E 100 UNIT capsule Take 100 Units by mouth daily.        Marland Kitchen XANAX 0.5 MG tablet TAKE  (1)  TABLET TWICE A DAY.  60 each  6  .  zolpidem (AMBIEN) 5 MG tablet Take 1 tablet (5 mg total) by mouth at bedtime as needed for sleep.  90 tablet  1     Past Medical History  Diagnosis Date  . Arteriosclerotic cardiovascular disease (ASCVD)     Cath in 9/99-70% mid LAD with diffuse distal disease, 70% T1, 60% mid circumflex, 50% mid RCA, normal ejection fraction; negative stress nuclear in 07/2008  . Sinoatrial node dysfunction 2003    Medtronic dual-chamber device  . Cerebrovascular disease     carotid bruits; no focal disease in 1999, 2002 and 2006  . ANXIETY   . Diastolic CHF, chronic     Normal EF  . COPD   . Hyperlipidemia   . Hypertension   . Hypothyroidism   . Gastroesophageal reflux disease     With hiatal hernia; irritable bowel syndrome; H. pylori-treated; Colonoscopy 2008: non-specific colitis, IH, Diverticulosis, Rectal ulcer secondary to ASA  . OSTEOARTHRITIS, KNEES, BILATERAL     Bilateral TKA  . VITAMIN B12 DEFICIENCY   . Carpal tunnel syndrome   . Peripheral neuropathy   . Diabetes mellitus     Insulin treatment    ROS:   All systems reviewed and negative except as noted in the HPI.   Past Surgical History    Procedure Date  . Total knee arthroplasty     Bilateral  . Nasal fracture surgery   . Cholecystectomy   . Abdominal hysterectomy   . Hemorrhoid surgery   . Carpal tunnel release   . Thyroidectomy 1980    Goiter  . Bladder repair   . Cardiac pacemaker placement 2003    Medtronic, dual-chamber  . Colonoscopy 2008     Family History  Problem Relation Age of Onset  . COPD Father   . Heart failure Mother   . Cancer Sister     colon  . Cancer Brother     lung     History   Social History  . Marital Status: Married    Spouse Name: N/A    Number of Children: N/A  . Years of Education: N/A   Occupational History  . Not on file.   Social History Main Topics  . Smoking status: Never Smoker   . Smokeless tobacco: Not on file   Comment: Retired- lives with spouse (who has dementia). Pt has 4 children  . Alcohol Use: No  . Drug Use: No  . Sexually Active: Not Currently   Other Topics Concern  . Not on file   Social History Narrative  . No narrative on file     There were no vitals taken for this visit.  Physical Exam:  Well appearing NAD HEENT: Unremarkable Neck:  No JVD, no thyromegally Lymphatics:  No adenopathy Back:  No CVA tenderness Lungs:  Clear with no wheezes. Well healed PPM insertion with minimal keloid. HEART:  Regular rate rhythm, no murmurs, no rubs, no clicks Abd:  soft, positive bowel sounds, no organomegally, no rebound, no guarding Ext:  2 plus pulses, no edema, no cyanosis, no clubbing Skin:  No rashes no nodules Neuro:  CN II through XII intact, motor grossly intact  DEVICE  Normal device function.  See PaceArt for details.   Assess/Plan:

## 2011-05-07 NOTE — Assessment & Plan Note (Signed)
Her blood pressure is 143/86. Continue current meds and maintain a low sodium diet.

## 2011-05-14 ENCOUNTER — Encounter: Payer: Self-pay | Admitting: Internal Medicine

## 2011-05-14 ENCOUNTER — Ambulatory Visit (INDEPENDENT_AMBULATORY_CARE_PROVIDER_SITE_OTHER): Payer: Medicare Other | Admitting: Internal Medicine

## 2011-05-14 ENCOUNTER — Ambulatory Visit: Payer: Medicare Other | Admitting: Internal Medicine

## 2011-05-14 VITALS — BP 118/76 | HR 82 | Temp 97.6°F | Wt 257.0 lb

## 2011-05-14 DIAGNOSIS — B379 Candidiasis, unspecified: Secondary | ICD-10-CM

## 2011-05-14 DIAGNOSIS — A084 Viral intestinal infection, unspecified: Secondary | ICD-10-CM

## 2011-05-14 DIAGNOSIS — A088 Other specified intestinal infections: Secondary | ICD-10-CM

## 2011-05-14 MED ORDER — PROMETHAZINE HCL 25 MG PO TABS: 25.0000 mg | ORAL_TABLET | Freq: Four times a day (QID) | ORAL | Status: DC | PRN

## 2011-05-14 MED ORDER — DIPHENOXYLATE-ATROPINE 2.5-0.025 MG PO TABS: 1.0000 | ORAL_TABLET | Freq: Four times a day (QID) | ORAL | Status: AC | PRN

## 2011-05-14 MED ORDER — NYSTATIN 100000 UNIT/GM EX POWD: CUTANEOUS | Status: DC

## 2011-05-14 NOTE — Patient Instructions (Addendum)
Use promethazine as needed for nausea and Lomotil as needed for diarrhea - Also use nystatin powder for rash Your prescription(s) have been submitted to your pharmacy. Please take as directed and contact our office if you believe you are having problem(s) with the medication(s). Once nausea vomiting and diarrhea symptoms controlled, maintain hydration as discussed- Use Tylenol as needed for body aches and pain   B.R.A.T. Diet Your doctor has recommended the B.R.A.T. diet for you or your child until the condition improves. This is often used to help control diarrhea and vomiting symptoms. If you or your child can tolerate clear liquids, you may have:  Bananas.     Rice.    Applesauce.    Toast (and other simple starches such as crackers, potatoes, noodles).  Be sure to avoid dairy products, meats, and fatty foods until symptoms are better. Fruit juices such as apple, grape, and prune juice can make diarrhea worse. Avoid these. Continue this diet for 2 days or as instructed by your caregiver. Document Released: 05/28/2005 Document Revised: 02/07/2011 Document Reviewed: 11/14/2006 [facility] Patient Information 2012 [place], [place].  Viral Gastroenteritis Gastroenteritis is an illness of the intestines. It is sometimes called "stomach flu." It causes nausea, vomiting, stomach cramps, watery poop (diarrhea) and a slight fever. This illness often clears up in 2 to 3 days. However, it can be serious when people who lose too much fluid from throwing up (vomiting) or watery poop. When too much fluid is lost and has not been replaced, it is called dehydration.   HOME CARE    Wash your hands often.     Rest.    Drink fluids slowly. Drinking too much or too fast can cause you to throw up.     Drink oral rehydration solution (ORS) as told by your doctor. Ask your doctor how to take ORS if you do not understand.     Stay away from really hot or cold liquids.     Avoid fruit, milk or other dairy  products.     Do not eat too much at once.     Avoid tobacco, alcohol and drugs that upset your stomach.     When watery poop stops, eat rice, bananas, apples with no skin, and dry toast.  GET HELP RIGHT AWAY IF:    You become weak, dizzy, or pass out (faint).     You cannot keep fluids down.     You have a dry mouth, no tears, and pee (urinate) less.     Belly (abdominal) pain starts, feels worse, or stays in one place.     You have a fever.     Watery poop has blood or mucus in it.     You become confused.     After 2 days, you are still throwing up or having watery poop.  MAKE SURE YOU:    Understand these instructions.     Will watch your condition.     Will get help right away if you are not doing well or get worse.  Document Released: 11/14/2007 Document Revised: 02/07/2011 Document Reviewed: 11/14/2007 [facility] Patient Information 2012 [place], [place].

## 2011-05-14 NOTE — Progress Notes (Signed)
Subjective:    Patient ID: [name] [name], female    DOB: 1932/08/05, 77 y.o.   MRN: [id number]  HPI  Walk-in today for same-day appointment  Complains of myalgias Associated with vomiting, diarrhea and diffuse weakness Onset of symptoms 6 DAYS AGO - wax and wane intensity, slowly worse Symptoms unimproved with soft food/liquid diet and rest Precipitated by sick contacts at home Denies cough, chest pain or abdominal pain   Past Medical History  Diagnosis Date  . Arteriosclerotic cardiovascular disease (ASCVD)     Cath in 9/99-70% mid LAD with diffuse distal disease, 70% T1, 60% mid circumflex, 50% mid RCA, normal ejection fraction; negative stress nuclear in 07/2008  . Sinoatrial node dysfunction 2003    Medtronic dual-chamber device  . Cerebrovascular disease     carotid bruits; no focal disease in 1999, 2002 and 2006  . ANXIETY   . Diastolic CHF, chronic     Normal EF  . COPD   . Hyperlipidemia   . Hypertension   . Hypothyroidism   . Gastroesophageal reflux disease     With hiatal hernia; irritable bowel syndrome; H. pylori-treated; Colonoscopy 2008: non-specific colitis, IH, Diverticulosis, Rectal ulcer secondary to ASA  . OSTEOARTHRITIS, KNEES, BILATERAL     Bilateral TKA  . VITAMIN B12 DEFICIENCY   . Carpal tunnel syndrome   . Peripheral neuropathy   . Diabetes mellitus     Insulin treatment     Review of Systems  Constitutional: Positive for chills and fatigue. Negative for fever.  Respiratory: Negative for chest tightness and shortness of breath.   Cardiovascular: Negative for palpitations and leg swelling.  Genitourinary: Negative for dysuria, flank pain and difficulty urinating.       Objective:   Physical Exam BP 118/76  Pulse 82  Temp(Src) 97.6 F (36.4 C) (Oral)  Wt 257 lb (116.574 kg)  SpO2 96% Weight: 257 lb (116.574 kg)  Constitutional: She is overweight; appears fatigued and mildly ill but no active distress.  HENT: Head: Normocephalic and  atraumatic. Ears: B TMs ok, no erythema or effusion; Nose: Nose normal.  Mouth/Throat: Oropharynx is clear and moist. No oropharyngeal exudate.  Eyes: Conjunctivae and EOM are normal. Pupils are equal, round, and reactive to light. No scleral icterus.  Neck: Normal range of motion. Neck supple. No JVD present. No thyromegaly present.  Cardiovascular: Normal rate, regular rhythm and normal heart sounds.  No murmur heard. No BLE edema. Pulmonary/Chest: Effort normal and breath sounds normal. No respiratory distress. She has no wheezes.  Abdominal: Soft. Bowel sounds are normal. She exhibits no distension. There is no tenderness. no masses Skin: Candidiasis changes beneath right breast - remaining skin is warm and dry. No other rash noted. No erythema.    Lab Results  Component Value Date   WBC 6.5 12/04/2010   HGB 14.1 12/04/2010   HCT 43.1 12/04/2010   PLT 256 12/04/2010   GLUCOSE 111* 12/04/2010   CHOL 158 08/25/2010   TRIG 115 08/25/2010   HDL 48 08/25/2010   LDLCALC 87 08/25/2010   ALT 27 12/18/2009   AST 33 12/18/2009   NA 138 12/04/2010   K 5.1 12/04/2010   CL 102 12/04/2010   CREATININE 1.05 12/04/2010   BUN 12 12/04/2010   CO2 26 12/04/2010   TSH 0.61 10/30/2010   INR 1.07 12/04/2010   HGBA1C 7.5* 01/10/2011   MICROALBUR 2.7* 08/29/2009       Assessment & Plan:  Viral gastroenteritis - myalgias with vomit and diarrhea -  Afeb and HD stable recommended continued BRAT diet - symptomatic control with promethazine and lomotil - new rx's today  Candidiasis -recurrent skin rash beneath breast - nystatin powder prescribed

## 2011-06-25 ENCOUNTER — Telehealth: Payer: Self-pay | Admitting: *Deleted

## 2011-06-25 NOTE — Telephone Encounter (Signed)
Needing refill on vesicare & alprazolam. Pt is requesting a 90 day supply on both meds. Per EPIC vesicare is not on med list. Does she suppose to be taking this med?Marland Kitchen..06/25/11@2 :44pm/LMB

## 2011-06-25 NOTE — Telephone Encounter (Signed)
Ok to refill both (vesicare is on med list - solifenacin)

## 2011-06-26 MED ORDER — SOLIFENACIN SUCCINATE 5 MG PO TABS: 10.0000 mg | ORAL_TABLET | Freq: Every day | ORAL | Status: DC

## 2011-06-26 MED ORDER — ALPRAZOLAM 0.5 MG PO TABS: 0.5000 mg | ORAL_TABLET | Freq: Two times a day (BID) | ORAL | Status: DC | PRN

## 2011-06-26 NOTE — Telephone Encounter (Signed)
Refill both meds # 90 fax back to [facility] pharmacy.Marland KitchenMarland Kitchen1/15/13@9 :23am/LMB

## 2011-07-02 ENCOUNTER — Telehealth: Payer: Self-pay | Admitting: Cardiology

## 2011-07-02 ENCOUNTER — Other Ambulatory Visit: Payer: Self-pay | Admitting: *Deleted

## 2011-07-02 DIAGNOSIS — L304 Erythema intertrigo: Secondary | ICD-10-CM | POA: Insufficient documentation

## 2011-07-02 MED ORDER — CLOTRIMAZOLE 1 % EX CREA: TOPICAL_CREAM | Freq: Two times a day (BID) | CUTANEOUS | Status: DC

## 2011-07-02 NOTE — Telephone Encounter (Signed)
Patient asked for an antibiotic during an office visit for her husband.  She showed me what appeared to be intertrigo in the folds of her abdominal wall and inguinal region.  I provided Clotrimazole and suggested she schedule an office visit with her PCP if that does not provide adequate relief.

## 2011-07-09 ENCOUNTER — Other Ambulatory Visit (INDEPENDENT_AMBULATORY_CARE_PROVIDER_SITE_OTHER): Payer: Medicare Other

## 2011-07-09 ENCOUNTER — Telehealth: Payer: Self-pay | Admitting: Internal Medicine

## 2011-07-09 ENCOUNTER — Telehealth: Payer: Self-pay

## 2011-07-09 ENCOUNTER — Ambulatory Visit: Payer: Medicare Other | Admitting: Internal Medicine

## 2011-07-09 DIAGNOSIS — N390 Urinary tract infection, site not specified: Secondary | ICD-10-CM

## 2011-07-09 LAB — URINALYSIS, ROUTINE W REFLEX MICROSCOPIC
Nitrite: NEGATIVE
Total Protein, Urine: NEGATIVE
Urobilinogen, UA: 0.2 (ref 0.0–1.0)

## 2011-07-09 MED ORDER — SULFAMETHOXAZOLE-TRIMETHOPRIM 800-160 MG PO TABS: 1.0000 | ORAL_TABLET | Freq: Two times a day (BID) | ORAL | Status: DC

## 2011-07-09 MED ORDER — NYSTATIN 100000 UNIT/GM EX POWD: CUTANEOUS | Status: DC

## 2011-07-09 NOTE — Telephone Encounter (Signed)
UA with UTI - erx septra done - also renew nystatin powder to treat yeast/fungus symptoms - thanks

## 2011-07-09 NOTE — Telephone Encounter (Signed)
Patient came into the office with abdominal and back pain. Patient also had pain and frequency with urination. [name] [name] put in an order for a ua and I took the specimen to the lab for testing. Patient also states she has a fungus along her waistline and down her legs. Please see above order and advise.

## 2011-07-09 NOTE — Telephone Encounter (Signed)
Put order in for UA. 

## 2011-07-16 ENCOUNTER — Ambulatory Visit: Payer: Medicare Other | Admitting: Internal Medicine

## 2011-07-23 ENCOUNTER — Other Ambulatory Visit (INDEPENDENT_AMBULATORY_CARE_PROVIDER_SITE_OTHER): Payer: Medicare Other

## 2011-07-23 ENCOUNTER — Ambulatory Visit (INDEPENDENT_AMBULATORY_CARE_PROVIDER_SITE_OTHER): Payer: Medicare Other | Admitting: Internal Medicine

## 2011-07-23 ENCOUNTER — Encounter: Payer: Self-pay | Admitting: Internal Medicine

## 2011-07-23 DIAGNOSIS — I10 Essential (primary) hypertension: Secondary | ICD-10-CM

## 2011-07-23 DIAGNOSIS — E039 Hypothyroidism, unspecified: Secondary | ICD-10-CM

## 2011-07-23 DIAGNOSIS — E119 Type 2 diabetes mellitus without complications: Secondary | ICD-10-CM

## 2011-07-23 LAB — HEMOGLOBIN A1C: Hgb A1c MFr Bld: 7.6 % — ABNORMAL HIGH (ref 4.6–6.5)

## 2011-07-23 LAB — MICROALBUMIN / CREATININE URINE RATIO
Creatinine,U: 53.4 mg/dL
Microalb Creat Ratio: 1.5 mg/g (ref 0.0–30.0)

## 2011-07-23 NOTE — Assessment & Plan Note (Signed)
stable overall by hx and exam combined amlodipine and ACEI fall 2012  BP Readings from Last 3 Encounters:  07/23/11 132/80  05/14/11 118/76  04/05/11 140/78

## 2011-07-23 NOTE — Progress Notes (Signed)
Subjective:    Patient ID: [name] [name], female    DOB: 10-11-1932, 78 y.o.   MRN: [id number]  HPI  Here for follow up -reviewed chronic medical issues:  HTN - reports compliance with ongoing medical treatment; recent changes in medication reviewed - prev off amlodipine in lotrel due to edema - then resumed due to intolerance of dilt - the patient reports compliance with medication(s) as prescribed. Denies adverse side effects.  hypothyroid - reports compliance with ongoing medical treatment; no changes in medication dose or frequency. denies adverse side effects related to current therapy.  DM2 - on insulin and oral meds - reports compliance with ongoing medical treatment; interval changes in medication reviewed - off actos due to fear SE, on generic amaryl and insulin. denies adverse side effects related to current therapy. no hypoglycemia symptoms or events - checks cbg 2x/d before meals -   dyslipidemia - dose statin increased by cards 07/10/10 - pt remains skeptical about meds and fearful of poss SE but taking as rx'd without problem at this time - improved FLP in 08/2010 at recheck  Past Medical History  Diagnosis Date  . Arteriosclerotic cardiovascular disease (ASCVD)     Cath in 9/99-70% mid LAD with diffuse distal disease, 70% T1, 60% mid circumflex, 50% mid RCA, normal ejection fraction; negative stress nuclear in 07/2008  . Sinoatrial node dysfunction 2003    Medtronic dual-chamber device  . Cerebrovascular disease     carotid bruits; no focal disease in 1999, 2002 and 2006  . ANXIETY   . Diastolic CHF, chronic     Normal EF  . COPD   . Hyperlipidemia   . Hypertension   . Hypothyroidism   . Gastroesophageal reflux disease     With hiatal hernia; irritable bowel syndrome; H. pylori-treated; Colonoscopy 2008: non-specific colitis, IH, Diverticulosis, Rectal ulcer secondary to ASA  . OSTEOARTHRITIS, KNEES, BILATERAL     Bilateral TKA  . VITAMIN B12 DEFICIENCY   . Carpal  tunnel syndrome   . Peripheral neuropathy   . Diabetes mellitus     Insulin treatment    Review of Systems  Cardiovascular: Negative for chest pain.  Gastrointestinal: Positive for constipation. Negative for vomiting and abdominal pain.  Genitourinary: Negative for dysuria and difficulty urinating.  Neurological: Negative for weakness and headaches.       Objective:   Physical Exam  BP 132/80  Pulse 89  Temp(Src) 97.2 F (36.2 C) (Oral)  Wt 260 lb 3.2 oz (118.026 kg)  SpO2 95%  Constitutional: She is obese; appears well-developed and well-nourished. No distress. spouse at side Neck: Normal range of motion. Neck supple. No JVD present. No thyromegaly present.  Cardiovascular: Normal rate, regular rhythm and normal heart sounds.  No murmur heard. No BLE edema. Pulmonary/Chest: Effort normal and breath sounds normal. No respiratory distress. She has no wheezes.  Psychiatric: She has a normal mood and affect. Her behavior is normal. Judgment and thought content normal.      Lab Results  Component Value Date   WBC 6.5 12/04/2010   HGB 14.1 12/04/2010   HCT 43.1 12/04/2010   PLT 256 12/04/2010   CHOL 158 08/25/2010   TRIG 115 08/25/2010   HDL 48 08/25/2010   ALT 27 09/17/8117   AST 33 12/18/2009   NA 138 12/04/2010   K 5.1 12/04/2010   CL 102 12/04/2010   CREATININE 1.05 12/04/2010   BUN 12 12/04/2010   CO2 26 12/04/2010   TSH 0.61 10/30/2010  INR 1.07 12/04/2010   HGBA1C 7.5* 01/10/2011   MICROALBUR 2.7* 08/29/2009    Assessment & Plan:  See problem list. Medications and labs reviewed today.

## 2011-07-23 NOTE — Assessment & Plan Note (Signed)
The current medical regimen is effective;  continue present plan and medications. Lab Results  Component Value Date   HGBA1C 7.5* 01/10/2011

## 2011-07-23 NOTE — Assessment & Plan Note (Signed)
Difficult with medication splitting - but otherwise doing well Check lab today - adjust as needed Lab Results  Component Value Date   TSH 0.61 10/30/2010

## 2011-07-23 NOTE — Patient Instructions (Addendum)
It was good to see you today. Medications reviewed, no changes at this time. Test(s) ordered today. Your results will be called to you after review (48-72hours after test completion). If any changes need to be made, you will be notified at that time. Please schedule followup in 3-4 months for diabetes check and cholesterol, call sooner if problems.

## 2011-08-03 ENCOUNTER — Other Ambulatory Visit: Payer: Self-pay | Admitting: *Deleted

## 2011-08-03 DIAGNOSIS — N390 Urinary tract infection, site not specified: Secondary | ICD-10-CM

## 2011-08-03 MED ORDER — SULFAMETHOXAZOLE-TRIMETHOPRIM 800-160 MG PO TABS: 1.0000 | ORAL_TABLET | Freq: Two times a day (BID) | ORAL | Status: DC

## 2011-08-03 NOTE — Telephone Encounter (Signed)
Okay. Bactrim twice a day x 1 week

## 2011-08-03 NOTE — Telephone Encounter (Signed)
Left msg on vm have kidney infection again. Don't have a driver and not able to come in. Wanting to know md will call in antibiotic... 08/03/11@12 "04pm/LMB

## 2011-08-03 NOTE — Telephone Encounter (Signed)
Notified pt md ok antibiotic sent to [facility] pharmacy... 08/03/11@1 :28pm/LMB

## 2011-09-25 ENCOUNTER — Other Ambulatory Visit: Payer: Self-pay | Admitting: Internal Medicine

## 2011-09-25 NOTE — Telephone Encounter (Signed)
Faxed script abck to [facility] pharmacy... 09/25/11@1 :10pm/LMB

## 2011-10-02 ENCOUNTER — Telehealth: Payer: Self-pay | Admitting: *Deleted

## 2011-10-02 ENCOUNTER — Other Ambulatory Visit: Payer: Self-pay | Admitting: Internal Medicine

## 2011-10-02 MED ORDER — LEVOTHYROXINE SODIUM 200 MCG PO TABS: 200.0000 ug | ORAL_TABLET | Freq: Every day | ORAL | Status: DC

## 2011-10-02 MED ORDER — LEVOTHYROXINE SODIUM 25 MCG PO TABS: 25.0000 ug | ORAL_TABLET | Freq: Every day | ORAL | Status: DC

## 2011-10-02 NOTE — Telephone Encounter (Signed)
Received fax stating levoxyl is on back order with no release date. Wanting to know which md prefer [name] [name] synthroid or levothyroxine. Sending rx for levothyroxine... 10/02/11@3 :19pm/LMB

## 2011-10-03 NOTE — Telephone Encounter (Signed)
Agree. thanks

## 2011-10-04 ENCOUNTER — Other Ambulatory Visit: Payer: Self-pay | Admitting: Internal Medicine

## 2011-10-17 ENCOUNTER — Other Ambulatory Visit: Payer: Self-pay

## 2011-10-17 DIAGNOSIS — N390 Urinary tract infection, site not specified: Secondary | ICD-10-CM

## 2011-10-17 MED ORDER — SULFAMETHOXAZOLE-TRIMETHOPRIM 800-160 MG PO TABS: 1.0000 | ORAL_TABLET | Freq: Two times a day (BID) | ORAL | Status: AC

## 2011-10-17 NOTE — Telephone Encounter (Signed)
Pt advised of Rx/pharmacy 

## 2011-10-17 NOTE — Telephone Encounter (Signed)
Pt called c/o dysuria, urgency, odor and decreased volume. Pt states that she is unable to come in for OV because her husband is sick and she cannot leave him home alone. Pt is requesting Rx for an ABX, please advise.

## 2011-10-17 NOTE — Telephone Encounter (Signed)
Ok - septra

## 2011-10-22 ENCOUNTER — Other Ambulatory Visit (INDEPENDENT_AMBULATORY_CARE_PROVIDER_SITE_OTHER): Payer: Medicare Other

## 2011-10-22 ENCOUNTER — Encounter: Payer: Self-pay | Admitting: Internal Medicine

## 2011-10-22 ENCOUNTER — Ambulatory Visit (INDEPENDENT_AMBULATORY_CARE_PROVIDER_SITE_OTHER): Payer: Medicare Other | Admitting: Internal Medicine

## 2011-10-22 VITALS — BP 138/72 | HR 80 | Temp 97.5°F | Wt 255.2 lb

## 2011-10-22 DIAGNOSIS — E039 Hypothyroidism, unspecified: Secondary | ICD-10-CM

## 2011-10-22 DIAGNOSIS — R300 Dysuria: Secondary | ICD-10-CM

## 2011-10-22 DIAGNOSIS — E785 Hyperlipidemia, unspecified: Secondary | ICD-10-CM

## 2011-10-22 DIAGNOSIS — E1149 Type 2 diabetes mellitus with other diabetic neurological complication: Secondary | ICD-10-CM

## 2011-10-22 DIAGNOSIS — E1142 Type 2 diabetes mellitus with diabetic polyneuropathy: Secondary | ICD-10-CM

## 2011-10-22 DIAGNOSIS — I10 Essential (primary) hypertension: Secondary | ICD-10-CM

## 2011-10-22 LAB — URINALYSIS, ROUTINE W REFLEX MICROSCOPIC
Leukocytes, UA: NEGATIVE
Nitrite: NEGATIVE
Specific Gravity, Urine: 1.01 (ref 1.000–1.030)
Urobilinogen, UA: 0.2 (ref 0.0–1.0)
pH: 6.5 (ref 5.0–8.0)

## 2011-10-22 LAB — LIPID PANEL
LDL Cholesterol: 81 mg/dL (ref 0–99)
Total CHOL/HDL Ratio: 3

## 2011-10-22 LAB — TSH: TSH: 0.55 u[IU]/mL (ref 0.35–5.50)

## 2011-10-22 NOTE — Assessment & Plan Note (Signed)
stable overall by hx and exam combined amlodipine and ACEI fall 2012  BP Readings from Last 3 Encounters:  10/22/11 138/72  07/23/11 132/80  05/14/11 118/76

## 2011-10-22 NOTE — Assessment & Plan Note (Signed)
Statin increase spring 2012 - doing well Recheck annually  

## 2011-10-22 NOTE — Patient Instructions (Signed)
It was good to see you today. Medications reviewed, no changes at this time. Test(s) ordered today. Your results will be called to you after review (48-72hours after test completion). If any changes need to be made, you will be notified at that time. Please schedule followup in 4 months for diabetes check, call sooner if problems.

## 2011-10-22 NOTE — Assessment & Plan Note (Signed)
The current medical regimen is effective;  continue present plan and medications. On ASA, ACEI, statin Lab Results  Component Value Date   HGBA1C 7.6* 07/23/2011

## 2011-10-22 NOTE — Assessment & Plan Note (Signed)
Difficult with medication splitting - but otherwise doing well Check lab today - adjust as needed Lab Results  Component Value Date   TSH 2.56 07/23/2011

## 2011-10-22 NOTE — Progress Notes (Signed)
Subjective:    Patient ID: [name] [name], female    DOB: 1933/01/13, 78 y.o.   MRN: [id number]  HPI  Here for follow up -reviewed chronic medical issues:  HTN - reports compliance with ongoing medical treatment; recent changes in medication reviewed - prev off amlodipine in lotrel due to edema - then resumed lotrel due to intolerance of dilt - the patient reports compliance with medication(s) as prescribed. Denies adverse side effects.  hypothyroid - reports compliance with ongoing medical treatment; no changes in medication dose or frequency. denies adverse side effects related to current therapy.  DM2 - on insulin and oral meds - reports compliance with ongoing medical treatment; interval changes in medication reviewed - off actos due to fear side effects, on generic amaryl and insulin. denies adverse side effects related to current therapy. no hypoglycemia symptoms or events - checks cbg 2x/d before meals -   dyslipidemia - dose statin increased by cards 07/10/10 - pt remains skeptical about meds - improved FLP in 08/2010 at recheck  Past Medical History  Diagnosis Date  . Arteriosclerotic cardiovascular disease (ASCVD)     Cath in 9/99-70% mid LAD with diffuse distal disease, 70% T1, 60% mid circumflex, 50% mid RCA, normal ejection fraction; negative stress nuclear in 07/2008  . Sinoatrial node dysfunction 2003    Medtronic dual-chamber device  . Cerebrovascular disease     carotid bruits; no focal disease in 1999, 2002 and 2006  . ANXIETY   . Diastolic CHF, chronic     Normal EF  . COPD   . Hyperlipidemia   . Hypertension   . Hypothyroidism   . Gastroesophageal reflux disease     With hiatal hernia; irritable bowel syndrome; H. pylori-treated; Colonoscopy 2008: non-specific colitis, IH, Diverticulosis, Rectal ulcer secondary to ASA  . OSTEOARTHRITIS, KNEES, BILATERAL     Bilateral TKA  . VITAMIN B12 DEFICIENCY   . Carpal tunnel syndrome   . Peripheral neuropathy   . Diabetes  mellitus     Insulin treatment    Review of Systems  Cardiovascular: Negative for chest pain.  Gastrointestinal: Positive for constipation. Negative for vomiting and abdominal pain.  Genitourinary: Negative for dysuria (but on tx for UTI - requests recheck UA), difficulty urinating and pelvic pain.  Neurological: Negative for weakness and headaches.       Objective:   Physical Exam  BP 138/72  Pulse 80  Temp(Src) 97.5 F (36.4 C) (Oral)  Wt 255 lb 4 oz (115.781 kg)  SpO2 95% Wt Readings from Last 3 Encounters:  10/22/11 255 lb 4 oz (115.781 kg)  07/23/11 260 lb 3.2 oz (118.026 kg)  05/14/11 257 lb (116.574 kg)   Constitutional: She is obese; appears well-developed and well-nourished. No distress. spouse at side Neck: Normal range of motion. Neck supple. No JVD present. No thyromegaly present.  Cardiovascular: Normal rate, regular rhythm and normal heart sounds.  No murmur heard. No BLE edema. Pulmonary/Chest: Effort normal and breath sounds normal. No respiratory distress. She has no wheezes.  Psychiatric: She has a normal mood and affect. Her behavior is normal. Judgment and thought content normal.      Lab Results  Component Value Date   WBC 6.5 12/04/2010   HGB 14.1 12/04/2010   HCT 43.1 12/04/2010   PLT 256 12/04/2010   CHOL 158 08/25/2010   TRIG 115 08/25/2010   HDL 48 08/25/2010   ALT 27 09/17/8117   AST 33 12/18/2009   NA 138 12/04/2010   K  5.1 12/04/2010   CL 102 12/04/2010   CREATININE 1.05 12/04/2010   BUN 12 12/04/2010   CO2 26 12/04/2010   TSH 2.56 07/23/2011   INR 1.07 12/04/2010   HGBA1C 7.6* 07/23/2011   MICROALBUR 0.8 07/23/2011    Assessment & Plan:  See problem list. Medications and labs reviewed today.  UTI - recheck UA per request though symptoms resolved

## 2011-11-30 DIAGNOSIS — S82402A Unspecified fracture of shaft of left fibula, initial encounter for closed fracture: Secondary | ICD-10-CM

## 2011-11-30 HISTORY — DX: Unspecified fracture of shaft of left fibula, initial encounter for closed fracture: S82.402A

## 2011-12-03 ENCOUNTER — Other Ambulatory Visit: Payer: Self-pay | Admitting: Internal Medicine

## 2011-12-03 ENCOUNTER — Ambulatory Visit (INDEPENDENT_AMBULATORY_CARE_PROVIDER_SITE_OTHER): Payer: Medicare Other | Admitting: Internal Medicine

## 2011-12-03 ENCOUNTER — Encounter: Payer: Self-pay | Admitting: Internal Medicine

## 2011-12-03 VITALS — BP 144/74 | HR 61 | Resp 18 | Ht 70.0 in | Wt 252.0 lb

## 2011-12-03 DIAGNOSIS — I5032 Chronic diastolic (congestive) heart failure: Secondary | ICD-10-CM

## 2011-12-03 DIAGNOSIS — I495 Sick sinus syndrome: Secondary | ICD-10-CM

## 2011-12-03 DIAGNOSIS — Z950 Presence of cardiac pacemaker: Secondary | ICD-10-CM

## 2011-12-03 DIAGNOSIS — I10 Essential (primary) hypertension: Secondary | ICD-10-CM

## 2011-12-03 LAB — PACEMAKER DEVICE OBSERVATION
AL AMPLITUDE: 4 mv
AL IMPEDENCE PM: 442 Ohm
BAMS-0001: 175 {beats}/min
RV LEAD IMPEDENCE PM: 631 Ohm
RV LEAD THRESHOLD: 0.5 V
VENTRICULAR PACING PM: 100

## 2011-12-03 MED ORDER — METOPROLOL SUCCINATE ER 25 MG PO TB24: 25.0000 mg | ORAL_TABLET | Freq: Every day | ORAL | Status: DC

## 2011-12-03 NOTE — Progress Notes (Signed)
HPI [name] [name] returns today for followup. She is a very pleasant 78 year old woman with a history of multiple medical problems including symptomatic bradycardia status post permanent pacemaker insertion. The patient complains of feeling tired and weak. She wonders if her medications are contributing. She notes that she wakes up 3-4 times every night to urinate. She denies syncope, chest pain, but does have peripheral edema. She denies syncope. Allergies  Allergen Reactions  . Penicillins      Current Outpatient Prescriptions  Medication Sig Dispense Refill  . albuterol-ipratropium (COMBIVENT) 18-103 MCG/ACT inhaler Inhale 2 puffs into the lungs every 6 (six) hours as needed.        Marland Kitchen aspirin 81 MG tablet Take 81 mg by mouth daily.        . clotrimazole (LOTRIMIN) 1 % cream Apply topically 2 (two) times daily.  30 g  0  . furosemide (LASIX) 40 MG tablet Take 40 mg by mouth daily. Twice daily only if needed       . HUMULIN N 100 UNIT/ML injection INJECT 20 UNITS SQ TWICE A DAY BEFORE MEALS  40 mL  1  . KLOR-CON M20 20 MEQ tablet TAKE 1 TABLET DAILY  90 each  1  . Lancets MISC       . levothyroxine (SYNTHROID, LEVOTHROID) 200 MCG tablet Take 1 tablet (200 mcg total) by mouth daily.  90 tablet  1  . levothyroxine (SYNTHROID, LEVOTHROID) 25 MCG tablet Take 1 tablet (25 mcg total) by mouth daily. As directed with 200 mcg  90 tablet  1  . LOTREL 10-20 MG per capsule TAKE (1) CAPSULE DAILY  90 each  1  . metoprolol succinate (TOPROL XL) 25 MG 24 hr tablet Take 1 tablet (25 mg total) by mouth daily. Take with or immediately following a meal.  30 tablet  12  . NEURONTIN 300 MG capsule TAKE 1 CAPSULE AT BEDTIME  90 each  1  . nitroGLYCERIN (NITROLINGUAL) 0.4 MG/SPRAY spray Place 1 spray under the tongue every 5 (five) minutes as needed.        . nystatin (MYCOSTATIN) powder Apply to affected area 3 times daily  60 g  1  . PRAVACHOL 80 MG tablet TAKE 1 TABLET AT BEDTIME FOR CHOLESTEROL  90 each  1  .  PRILOSEC 20 MG capsule TAKE 1 CAPSULE ONCE A DAY FOR STOMACH  90 each  1  . solifenacin (VESICARE) 5 MG tablet Take 5 mg by mouth daily.      . SYRINGE-NEEDLE, DISP, 3 ML (B-D 3CC LUER-LOK SYR 23GX1-1/4) 23G X 1-1/4" 3 ML MISC by Does not apply route every 30 (thirty) days. To dispense B12 monthly       . triamcinolone cream (KENALOG) 0.1 % APPLY TO AFFECTED SKIN TWICE A DAY AS NEEDED FOR ITCH  453.6 g  0  . vitamin E 100 UNIT capsule Take 100 Units by mouth daily.        Marland Kitchen XANAX 0.5 MG tablet TAKE 1 TABLET TWICE A DAY AS NEEDED FOR SLEEP OR ANXIETY  180 each  1  . zolpidem (AMBIEN) 5 MG tablet Take 1 tablet (5 mg total) by mouth at bedtime as needed for sleep.  90 tablet  1  . DISCONTD: AMARYL 2 MG tablet TAKE 1 TABLET DAILY IN THE MORNING FOR DIABETES  90 each  3  . DISCONTD: HYDROcodone-acetaminophen (VICODIN) 5-500 MG per tablet Take 1/2-1 tablet every 8 hours as needed for pain  120 tablet  1  .  DISCONTD: TOPROL XL 50 MG 24 hr tablet TAKE 1 TABLET ONCE A DAY FOR HIGH BLOOD PRESSURE  90 each  1  . DISCONTD: VENTOLIN HFA 108 (90 BASE) MCG/ACT inhaler INHALE 2 PUFFS 4 TIMES A DAY AS DIRECTED  18 g  6  . AMARYL 2 MG tablet TAKE 1 TABLET DAILY IN THE MORNING FOR DIABETES  90 each  1  . VENTOLIN HFA 108 (90 BASE) MCG/ACT inhaler INHALE 2 PUFFS 4 TIMES A DAY AS DIRECTED  8.5 each  1  . VICODIN 5-500 MG per tablet TAKE 1/2 TO 1 TABLET EVERY 8 HOURS AS NEEDED FOR PAIN  120 each  1     Past Medical History  Diagnosis Date  . Arteriosclerotic cardiovascular disease (ASCVD)     Cath in 9/99-70% mid LAD with diffuse distal disease, 70% T1, 60% mid circumflex, 50% mid RCA, normal ejection fraction; negative stress nuclear in 07/2008  . Sinoatrial node dysfunction 2003    Medtronic dual-chamber device  . Cerebrovascular disease     carotid bruits; no focal disease in 1999, 2002 and 2006  . ANXIETY   . Diastolic CHF, chronic     Normal EF  . COPD   . Hyperlipidemia   . Hypertension   .  Hypothyroidism   . Gastroesophageal reflux disease     With hiatal hernia; irritable bowel syndrome; H. pylori-treated; Colonoscopy 2008: non-specific colitis, IH, Diverticulosis, Rectal ulcer secondary to ASA  . OSTEOARTHRITIS, KNEES, BILATERAL     Bilateral TKA  . VITAMIN B12 DEFICIENCY   . Carpal tunnel syndrome   . Peripheral neuropathy   . Diabetes mellitus     Insulin treatment    ROS:   All systems reviewed and negative except as noted in the HPI.   Past Surgical History  Procedure Date  . Total knee arthroplasty     Bilateral  . Nasal fracture surgery   . Cholecystectomy   . Abdominal hysterectomy   . Hemorrhoid surgery   . Carpal tunnel release   . Thyroidectomy 1980    Goiter  . Bladder repair   . Cardiac pacemaker placement 2003    Medtronic, dual-chamber  . Colonoscopy 2008     Family History  Problem Relation Age of Onset  . COPD Father   . Heart failure Mother   . Cancer Sister     colon  . Cancer Brother     lung     History   Social History  . Marital Status: Married    Spouse Name: N/A    Number of Children: N/A  . Years of Education: N/A   Occupational History  . Not on file.   Social History Main Topics  . Smoking status: Never Smoker   . Smokeless tobacco: Not on file   Comment: Retired- lives with spouse (who has dementia). Pt has 4 children  . Alcohol Use: No  . Drug Use: No  . Sexually Active: Not Currently   Other Topics Concern  . Not on file   Social History Narrative  . No narrative on file     BP 144/74  Pulse 61  Resp 18  Ht 5\' 10"  (1.778 m)  Wt 252 lb (114.306 kg)  BMI 36.16 kg/m2  Physical Exam:  Obese appearing elderly woman, NAD HEENT: Unremarkable Neck:  No JVD, no thyromegally Lungs:  Clear with no wheezes, rales, or rhonchi. HEART:  Regular rate rhythm, no murmurs, no rubs, no clicks Abd:  soft, positive bowel  sounds, no organomegally, no rebound, no guarding Ext:  2 plus pulses, no edema, no  cyanosis, no clubbing Skin:  No rashes no nodules Neuro:  CN II through XII intact, motor grossly intact  DEVICE  Normal device function.  See PaceArt for details.   Assess/Plan:

## 2011-12-03 NOTE — Assessment & Plan Note (Signed)
Her blood pressure is fairly well controlled. She is instructed to reduce her salt intake and to continue her current medications except as noted.

## 2011-12-03 NOTE — Patient Instructions (Addendum)
Your physician recommends that you schedule a follow-up appointment in: 1 year with Dr Ladona Ridgel and 6 months for a Device check  Your physician has recommended you make the following change in your medication: DECREASE Toprol to 25 mg daily

## 2011-12-03 NOTE — Assessment & Plan Note (Signed)
Her symptoms are predominantly those of fatigue. I've asked the patient to reduce her dose of beta blocker from 50 mg to 25 mg daily. If this does not improve her symptoms, then we would consider reinitiating diuretic therapy.

## 2011-12-03 NOTE — Telephone Encounter (Signed)
Faxed vicodin script back to [facility]... 12/03/11@3 ;27pm/LMB

## 2011-12-03 NOTE — Assessment & Plan Note (Signed)
Her device is working normally. There is longevity approximately 9 years. We'll recheck in several months.

## 2011-12-05 ENCOUNTER — Other Ambulatory Visit: Payer: Self-pay | Admitting: *Deleted

## 2011-12-05 DIAGNOSIS — I10 Essential (primary) hypertension: Secondary | ICD-10-CM

## 2011-12-05 MED ORDER — METOPROLOL SUCCINATE ER 25 MG PO TB24: 25.0000 mg | ORAL_TABLET | Freq: Every day | ORAL | Status: DC

## 2011-12-08 ENCOUNTER — Emergency Department (HOSPITAL_COMMUNITY): Payer: Medicare Other

## 2011-12-08 ENCOUNTER — Emergency Department (HOSPITAL_COMMUNITY)
Admission: EM | Admit: 2011-12-08 | Discharge: 2011-12-08 | Disposition: A | Discharge status: Home / Self Care | Payer: Medicare Other | Attending: Emergency Medicine | Admitting: Emergency Medicine

## 2011-12-08 ENCOUNTER — Encounter (HOSPITAL_COMMUNITY): Payer: Self-pay | Admitting: *Deleted

## 2011-12-08 DIAGNOSIS — S82839A Other fracture of upper and lower end of unspecified fibula, initial encounter for closed fracture: Secondary | ICD-10-CM

## 2011-12-08 DIAGNOSIS — K449 Diaphragmatic hernia without obstruction or gangrene: Secondary | ICD-10-CM | POA: Insufficient documentation

## 2011-12-08 DIAGNOSIS — Z7982 Long term (current) use of aspirin: Secondary | ICD-10-CM | POA: Insufficient documentation

## 2011-12-08 DIAGNOSIS — I10 Essential (primary) hypertension: Secondary | ICD-10-CM | POA: Insufficient documentation

## 2011-12-08 DIAGNOSIS — Z950 Presence of cardiac pacemaker: Secondary | ICD-10-CM | POA: Insufficient documentation

## 2011-12-08 DIAGNOSIS — K589 Irritable bowel syndrome without diarrhea: Secondary | ICD-10-CM | POA: Insufficient documentation

## 2011-12-08 DIAGNOSIS — K219 Gastro-esophageal reflux disease without esophagitis: Secondary | ICD-10-CM | POA: Insufficient documentation

## 2011-12-08 DIAGNOSIS — F411 Generalized anxiety disorder: Secondary | ICD-10-CM | POA: Insufficient documentation

## 2011-12-08 DIAGNOSIS — E785 Hyperlipidemia, unspecified: Secondary | ICD-10-CM | POA: Insufficient documentation

## 2011-12-08 DIAGNOSIS — E538 Deficiency of other specified B group vitamins: Secondary | ICD-10-CM | POA: Insufficient documentation

## 2011-12-08 DIAGNOSIS — W010XXA Fall on same level from slipping, tripping and stumbling without subsequent striking against object, initial encounter: Secondary | ICD-10-CM | POA: Insufficient documentation

## 2011-12-08 DIAGNOSIS — J449 Chronic obstructive pulmonary disease, unspecified: Secondary | ICD-10-CM | POA: Insufficient documentation

## 2011-12-08 DIAGNOSIS — Y998 Other external cause status: Secondary | ICD-10-CM | POA: Insufficient documentation

## 2011-12-08 DIAGNOSIS — I2510 Atherosclerotic heart disease of native coronary artery without angina pectoris: Secondary | ICD-10-CM | POA: Insufficient documentation

## 2011-12-08 DIAGNOSIS — I679 Cerebrovascular disease, unspecified: Secondary | ICD-10-CM | POA: Insufficient documentation

## 2011-12-08 DIAGNOSIS — S82899A Other fracture of unspecified lower leg, initial encounter for closed fracture: Secondary | ICD-10-CM | POA: Insufficient documentation

## 2011-12-08 DIAGNOSIS — K5730 Diverticulosis of large intestine without perforation or abscess without bleeding: Secondary | ICD-10-CM | POA: Insufficient documentation

## 2011-12-08 DIAGNOSIS — E039 Hypothyroidism, unspecified: Secondary | ICD-10-CM | POA: Insufficient documentation

## 2011-12-08 DIAGNOSIS — E119 Type 2 diabetes mellitus without complications: Secondary | ICD-10-CM | POA: Insufficient documentation

## 2011-12-08 DIAGNOSIS — Z96659 Presence of unspecified artificial knee joint: Secondary | ICD-10-CM | POA: Insufficient documentation

## 2011-12-08 DIAGNOSIS — Y92009 Unspecified place in unspecified non-institutional (private) residence as the place of occurrence of the external cause: Secondary | ICD-10-CM | POA: Insufficient documentation

## 2011-12-08 DIAGNOSIS — Z794 Long term (current) use of insulin: Secondary | ICD-10-CM | POA: Insufficient documentation

## 2011-12-08 DIAGNOSIS — I5032 Chronic diastolic (congestive) heart failure: Secondary | ICD-10-CM | POA: Insufficient documentation

## 2011-12-08 DIAGNOSIS — J4489 Other specified chronic obstructive pulmonary disease: Secondary | ICD-10-CM | POA: Insufficient documentation

## 2011-12-08 DIAGNOSIS — Z79899 Other long term (current) drug therapy: Secondary | ICD-10-CM | POA: Insufficient documentation

## 2011-12-08 HISTORY — DX: Presence of cardiac pacemaker: Z95.0

## 2011-12-08 MED ORDER — HYDROCODONE-ACETAMINOPHEN 5-325 MG PO TABS
2.0000 | ORAL_TABLET | Freq: Once | ORAL | Status: AC
  Administered 2011-12-08: 2 via ORAL
  Filled 2011-12-08: qty 2

## 2011-12-08 MED ORDER — SODIUM CHLORIDE 0.9 % IV BOLUS (SEPSIS)
500.0000 mL | Freq: Once | INTRAVENOUS | Status: AC
  Administered 2011-12-08: 500 mL via INTRAVENOUS

## 2011-12-08 MED ORDER — HYDROCODONE-ACETAMINOPHEN 5-500 MG PO TABS: 1.0000 | ORAL_TABLET | Freq: Four times a day (QID) | ORAL | Status: DC | PRN

## 2011-12-08 MED ORDER — HYDROMORPHONE HCL PF 1 MG/ML IJ SOLN
0.5000 mg | Freq: Once | INTRAMUSCULAR | Status: AC
  Administered 2011-12-08: 0.5 mg via INTRAVENOUS
  Filled 2011-12-08: qty 1

## 2011-12-08 MED ORDER — ONDANSETRON HCL 4 MG/2ML IJ SOLN
4.0000 mg | Freq: Once | INTRAMUSCULAR | Status: AC
  Administered 2011-12-08: 4 mg via INTRAVENOUS
  Filled 2011-12-08: qty 2

## 2011-12-08 MED ORDER — MORPHINE SULFATE 4 MG/ML IJ SOLN
4.0000 mg | Freq: Once | INTRAMUSCULAR | Status: AC
  Administered 2011-12-08: 4 mg via INTRAVENOUS
  Filled 2011-12-08: qty 1

## 2011-12-08 NOTE — Discharge Instructions (Signed)
Your xrays show a proximal fibula fracture. Wear knee immobilizer. Elevate leg. Ice/coldpack to sore area. Use walker and/or wheelchair to assist with mobility.  Take motrin or aleve as need for pain. You may also take vicodin as need for pain. No driving for the next 6 hours or when taking vicodin. Also, do not take tylenol or acetaminophen containing medication when taking vicodin. Follow up with orthopedist in coming week - see referral - call office Monday to arrange appointment.  We have also ordered a home health evaluation for possible additional assistance at home. Return to ER if worse, worsening or intractable pain, new symptoms/pain, numbness, other concern.     Fibular Fracture, Ankle, Adult, Treated with or without Immobilization You have a fracture (break) of your fibula. This is the bone in your lower leg located on the outside of the leg. These fractures are easily diagnosed with x-rays. TREATMENT  You have a simple fracture of the part of the fibula which is located between the knee and ankle. This bone usually will heal without problems and can often be treated without casting or splinting. This means the fracture will heal well during normal use and daily activities without being held in place. Sometimes a cast or splint is placed on these fractures if it is needed for comfort or if the bones are badly out of place. HOME CARE INSTRUCTIONS   Apply ice to the injury for 15 to 20 minutes, 3 to 4 times per day while awake, for 2 days. Put the ice in a plastic bag and place a thin towel between the bag of ice and your leg. This helps keep swelling down.   Use crutches as directed. Resume walking without crutches as directed by your caregiver or when comfortable doing so.   Only take over-the-counter or prescription medicines for pain, discomfort, or fever as directed by your caregiver.   Keep appointments for follow up x-rays if these are required.   If you have a removable  splint or boot, do not remove the boot unless directed by your caregiver.   Warning: Do not drive a car or operate a motor vehicle until your caregiver specifically tells you it is safe to do so.  SEEK MEDICAL CARE IF:   You have continued severe pain or more swelling   The medications do not control the pain.   Your skin or nails below the injury turn blue or grey or feel cold or numb.   You develop severe pain in the leg or foot.  MAKE SURE YOU:   Understand these instructions.   Will watch your condition.   Will get help right away if you are not doing well or get worse.  Document Released: 05/28/2005 Document Revised: 05/17/2011 Document Reviewed: 01/02/2008 [facility] Patient Information 2012 [place], [place].     Knee Brace A knee brace (immobilizer) supports your knee and keeps you from bending it. It should fit high up on the thigh. The bump or ridge goes behind the knee. Ask questions about how to put it on and how to adjust it for comfort. HOME CARE   Wear and remove the brace only as told by your doctor.   Adjust the brace as often as needed while wearing it.   Do not drive a motorcycle while wearing a knee brace.   Do not drive a car or truck if the brace is on the right knee.   Learn how to walk safely on crutches, if you were fitted  with them.   Only take medicine as told by your doctor.   Keep all follow-up appointments.  GET HELP RIGHT AWAY IF:   The pain is not helped by medicine.   There is puffiness (swelling) or tenderness below the knee.   There is chest pain or shortness of breath.  MAKE SURE YOU:   Understand these instructions.   Will watch this condition.   Will get help right away if you are not doing well or get worse.  Document Released: 03/06/2008 Document Revised: 05/17/2011 Document Reviewed: 06/17/2009 [facility] Patient Information 2012 [place], [place].   Cryotherapy Cryotherapy means treatment with cold. Ice or gel packs can be  used to reduce both pain and swelling. Ice is the most helpful within the first 24 to 48 hours after an injury or flareup from overusing a muscle or joint. Sprains, strains, spasms, burning pain, shooting pain, and aches can all be eased with ice. Ice can also be used when recovering from surgery. Ice is effective, has very few side effects, and is safe for most people to use. PRECAUTIONS  Ice is not a safe treatment option for people with:  Raynaud's phenomenon. This is a condition affecting small blood vessels in the extremities. Exposure to cold may cause your problems to return.   Cold hypersensitivity. There are many forms of cold hypersensitivity, including:   Cold urticaria. Red, itchy hives appear on the skin when the tissues begin to warm after being iced.   Cold erythema. This is a red, itchy rash caused by exposure to cold.   Cold hemoglobinuria. Red blood cells break down when the tissues begin to warm after being iced. The hemoglobin that carry oxygen are passed into the urine because they cannot combine with blood proteins fast enough.   Numbness or altered sensitivity in the area being iced.  If you have any of the following conditions, do not use ice until you have discussed cryotherapy with your caregiver:  Heart conditions, such as arrhythmia, angina, or chronic heart disease.   High blood pressure.   Healing wounds or open skin in the area being iced.   Current infections.   Rheumatoid arthritis.   Poor circulation.   Diabetes.  Ice slows the blood flow in the region it is applied. This is beneficial when trying to stop inflamed tissues from spreading irritating chemicals to surrounding tissues. However, if you expose your skin to cold temperatures for too long or without the proper protection, you can damage your skin or nerves. Watch for signs of skin damage due to cold. HOME CARE INSTRUCTIONS Follow these tips to use ice and cold packs safely.  Place a dry or  damp towel between the ice and skin. A damp towel will cool the skin more quickly, so you may need to shorten the time that the ice is used.   For a more rapid response, add gentle compression to the ice.   Ice for no more than 10 to 20 minutes at a time. The bonier the area you are icing, the less time it will take to get the benefits of ice.   Check your skin after 5 minutes to make sure there are no signs of a poor response to cold or skin damage.   Rest 20 minutes or more in between uses.   Once your skin is numb, you can end your treatment. You can test numbness by very lightly touching your skin. The touch should be so light that you do  not see the skin dimple from the pressure of your fingertip. When using ice, most people will feel these normal sensations in this order: cold, burning, aching, and numbness.   Do not use ice on someone who cannot communicate their responses to pain, such as small children or people with dementia.  HOW TO MAKE AN ICE PACK Ice packs are the most common way to use ice therapy. Other methods include ice massage, ice baths, and cryo-sprays. Muscle creams that cause a cold, tingly feeling do not offer the same benefits that ice offers and should not be used as a substitute unless recommended by your caregiver. To make an ice pack, do one of the following:  Place crushed ice or a bag of frozen vegetables in a sealable plastic bag. Squeeze out the excess air. Place this bag inside another plastic bag. Slide the bag into a pillowcase or place a damp towel between your skin and the bag.   Mix 3 parts water with 1 part rubbing alcohol. Freeze the mixture in a sealable plastic bag. When you remove the mixture from the freezer, it will be slushy. Squeeze out the excess air. Place this bag inside another plastic bag. Slide the bag into a pillowcase or place a damp towel between your skin and the bag.  SEEK MEDICAL CARE IF:  You develop white spots on your skin. This  may give the skin a blotchy (mottled) appearance.   Your skin turns blue or pale.   Your skin becomes waxy or hard.   Your swelling gets worse.  MAKE SURE YOU:   Understand these instructions.   Will watch your condition.   Will get help right away if you are not doing well or get worse.  Document Released: 01/22/2011 Document Revised: 05/17/2011 Document Reviewed: 01/22/2011 [facility] Patient Information 2012 [place], [place].     Home Safety and Preventing Falls Falls are a leading cause of injury and while they affect all age groups, falls have greater short-term and long-term impact on older age groups. However, falls should not be a part of life or aging. It is possible for individuals and their families to use preventive measures to significantly decrease the likelihood that anyone, especially an older adult, will fall. There are many simple measures which can make your home safer with respect to preventing falls. The following actions can help reduce falls among all members of your family and are especially important as you age, when your balance, lower limb strength, coordination, and eyesight may be declining. The use of preventive measures will help to reduce you and your family's risk of falls and serious medical consequences. OUTDOORS  Repair cracks and edges of walkways and driveways.   Remove high doorway thresholds and trim shrubbery on the main path into your home.   Ensure there is good outside lighting at main entrances and along main walkways.   Clear walkways of tools, rocks, debris, and clutter.   Check that handrails are not broken and are securely fastened. Both sides of steps should have handrails.   In the garage, be attentive to and clean up grease or oil spills on the cement. This can make the surface extremely slippery.   In winter, have leaves, snow, and ice cleared regularly.   Use sand or salt on walkways during winter months.  BATHROOM  Install  grab bars by the toilet and in the tub and shower.   Use non-skid mats or decals in the tub or shower.  If unable to easily stand unsupported while showering, place a plastic non slip stool in the shower to sit on when needed.   Install night lights.   Keep floors dry and clean up all water on the floor immediately.   Remove soap buildup in tub or shower on a regular basis.   Secure bath mats with non-slip, double-sided rug tape.   Remove tripping hazards from the floors.  BEDROOMS  Install night lights.   Do not use oversized bedding.   Make sure a bedside light is easy to reach.   Keep a telephone by your bedside.   Make sure that you can get in and out of your bed easily.   Have a firm chair, with side arms, to use for getting dressed.   Remove clutter from around closets.   Store clothing, bed coverings, and other household items where you can reach them comfortably.   Remove tripping hazards from the floor.  LIVING AREAS AND STAIRWAYS  Turn on lights to avoid having to walk through dark areas.   Keep lighting uniform in each room. Place brighter lightbulbs in darker areas, including stairways.   Replace lightbulbs that burn out in stairways immediately.   Arrange furniture to provide for clear pathways.   Keep furniture in the same place.   Eliminate or tape down electrical cables in high traffic areas.   Place handrails on both sides of stairways. Use handrails when going up or down stairs.   Most falls occur on the top or bottom 3 steps.   Fix any loose handrails. Make sure handrails on both sides of the stairways are as long as the stairs.   Remove all walkway obstacles.   Coil or tape electrical cords off to the side of walking areas and out of the way. If using many extension cords, have an electrician put in a new wall outlet to reduce or eliminate them.   Make sure spills are cleaned up quickly and allow time for drying before walking on freshly  cleaned floors.   Firmly attach carpet with non-skid or two-sided tape.   Keep frequently used items within easy reach.   Remove tripping hazards such as throw rugs and clutter in walkways. Never leave objects on stairs.   Get rid of throw rugs elsewhere if possible.   Eliminate uneven floor surfaces.   Make sure couches and chairs are easy to get into and out of.   Check carpeting to make sure it is firmly attached along stairs.   Make repairs to worn or loose carpet promptly.   Select a carpet pattern that does not visually hide the edge of steps.   Avoid placing throw rugs or scatter rugs at the top or bottom of stairways, or properly secure with carpet tape to prevent slippage.   Have an electrician put in a light switch at the top and bottom of the stairs.   Get light switches that glow.   Avoid the following practices: hurrying, inattention, obscured vision, carrying large loads, and wearing slip-on shoes.   Be aware of all pets.  KITCHEN  Place items that are used frequently, such as dishes and food, within easy reach.   Keep handles on pots and pans toward the center of the stove. Use back burners when possible.   Make sure spills are cleaned up quickly and allow time for drying.   Avoid walking on wet floors.   Avoid hot utensils and knives.   Position shelves so  they are not too high or low.   Place commonly used objects within easy reach.   If necessary, use a sturdy step stool with a grab bar when reaching.   Make sure electrical cables are out of the way.   Do not use floor polish or wax that makes floors slippery.  OTHER HOME FALL PREVENTION STRATEGIES  Wear low heel or rubber sole shoes that are supportive and fit well.   Wear closed toe shoes.   Know and watch for side effects of medications. Have your caregiver or pharmacist look at all your medicines, even over-the-counter medicines. Some medicines can make you sleepy or dizzy.   Exercise  regularly. Exercise makes you stronger and improves your balance and coordination.   Limit use of alcohol.   Use eyeglasses if necessary and keep them clean. Have your vision checked every year.   Organize your household in a manner that minimizes the need to walk distances when hurried, or go up and down stairs unnecessarily. For example, have a phone placed on at least each floor of your home. If possible, have a phone beside each sitting or lying area where you spend the most time at home. Keep emergency numbers posted at all phones.   Use non-skid floor wax.   When using a ladder, make sure:   The base is firm.   All ladder feet are on level ground.   The ladder is angled against the wall properly.   When climbing a ladder, face the ladder and hold the ladder rungs firmly.   If reaching, always keep your hips and body weight centered between the rails.   When using a stepladder, make sure it is fully opened and both spreaders are firmly locked.   Do not climb a closed stepladder.   Avoid climbing beyond the second step from the top of a stepladder and the 4th rung from the top of an extension ladder.   Learn and use mobility aids as needed.   Change positions slowly. Arise slowly from sitting and lying positions. Sit on the edge of your bed before getting to your feet.   If you have a history of falls, ask someone to add color or contrast paint or tape to grab bars and handrails in your home.   If you have a history of falls, ask someone to place contrasting color strips on first and last steps.   Install an electrical emergency response system if you need one, and know how to use it.   If you have a medical or other condition that causes you to have limited physical strength, it is important that you reach out to family and friends for occasional help.  FOR CHILDREN:  If young children are in the home, use safety gates. At the top of stairs use screw-mounted gates; use  pressure-mounted gates for the bottom of the stairs and doorways between rooms.   Young children should be taught to descend stairs on their stomachs, feet first, and later using the handrail.   Keep drawers fully closed to prevent them from being climbed on or pulled out entirely.   Move chairs, cribs, beds and other furniture away from windows.   Consider installing window guards on windows ground floor and up, unless they are emergency fire exits. Make sure they have easy release mechanisms.   Consider installing special locks that only allow the window to be opened to a certain height.   Never rely on window screens to  prevent falls.   Never leave babies alone on changing tables, beds or sofas. Use a changing table that has a restraining strap.   When a child can pull to a standing position, the crib mattress should be adjusted to its lowest position. There should be at least 26 inches between the top rails of the crib drop side and the mattress. Toys, bumper pads, and other objects that can be used as steps to climb out should be removed from the crib.   On bunk beds never allow a child under age 65 to sleep on the top bunk. For older children, if the upper bunk is not against a wall, use guard rails on both sides. No matter how old a child is, keep the guard rails in place on the top bunk since children roll during sleep. Do not permit horseplay on bunks.   Grass and soil surfaces beneath backyard playground equipment should be replaced with hardwood chips, shredded wood mulch, sand, pea gravel, rubber, crushed stone, or another safer material at depths of at least 9 to 12 inches.   When riding bikes or using skates, skateboards, skis, or snowboards, require children to wear helmets. Look for those that have stickers stating that they meet or exceed safety standards.   Vertical posts or pickets in deck, balcony, and stairway railings should be no more than 3 1/2 inches apart if a young  baby will have access to the area. The space between horizontal rails or bars, and between the floor and the first horizontal rail or bar, should be no more than 3 1/2 inches.  Document Released: 05/18/2002 Document Revised: 05/17/2011 Document Reviewed: 03/17/2009 [facility] Patient Information 2012 [place], [place].    Walker Use HOW TO TELL IF A WALKER IS THE RIGHT SIZE  With your arms hanging at your sides, the walker handles should be at wrist level. If you cannot find the exact fit, choose the height that is most comfortable.   If you have been instructed to not place weight on one of your legs, you may feel more comfortable with a shorter height. If you are using the walker for balance, you may prefer a taller height.   Adjust the height by using the push buttons on the legs of your walker.   In rest position, the back leg of the walkers should be no further ahead than your toes. With your hands resting on the grips, your elbows should be slightly bent at about a 30 degree angle.   Ask your physical therapist or caregiver if you have any concerns.  HOW TO USE A STANDARD WALKER (NO WHEELS)  Pick your walker up (do not slide your walker) and place it one step length in front of you. The back legs of the walker should be no further ahead than your toes. You should not feel like you need to lean forward to keep your hands on the grips. As you set the walker down, make sure all 4 leg tips contact the ground at the same time.   Hold onto the walker for support and step forward with your weaker leg into the middle of the walker. Follow the weight bearing instructions your caregiver has given you.   Push down with your hands and step forward with your stronger leg.   Be careful not to let the walker get too far ahead of you as you walk.   Repeat the process for each step.  HOW TO USE A FRONT-WHEELED WALKER  Slide your walker forward. The back legs of the walker should be no further ahead  than your toes. You should not feel like you need to lean forward to keep your hands on the grips.   Hold onto the walker for support and step forward with your weaker leg into the middle of the walker. Follow the weight bearing instructions your caregiver has given you.   Push down with your hands and step forward with your stronger leg.   Be careful not to let the walker get too far ahead of you as you walk.   Repeat the process for each step.   If your walker does not glide well over carpet, consider cutting an "x" in 2 old tennis balls and placing them over the back legs of your walker.  STANDING UP FROM A CHAIR WITH ARMRESTS  It is best to sit in a firm chair with armrests.   Position your walker directly in front of your chair. Do not pull on the walker when standing up. It is too unstable to support weight when pulled on.   Slide forward in the chair, with your weaker leg ahead and stronger leg bent near the chair.   Lean forward and push up from your chair with both hands on the armrests. Straighten your stronger leg, rising to standing. Do not pull yourself up from the walker. This may cause it to tip.   When you feel steady on your feet, carefully move one hand at a time to the walker.   Stand for a few seconds to stabilize your balance before you start to walk.  STANDING UP FROM A CHAIR WITHOUT ARMRESTS  It is best to sit in a firm chair. A low seat or an overstuffed chair or sofa is hard to get out of.   Place the walker in front of you. Do not pull on the walker when coming to a standing position.   Slide forward in the chair, with your weaker leg ahead and stronger leg bent near the chair.   Push down on the chair seat with the hand opposite your weaker leg. Keep your other hand on the center of the walker's crossbar.   Stand, steady your balance, and place your hands on the walker handgrips.  SITTING DOWN  Always back up toward your chair, using your walker, until  you feel the back of your legs touch the chair.   If the chair has armrests, carefully reach back to put your hands on the armrests, and slowly lower your weight.   If the chair does not have armrests, consider backing up to the side of the chair. You can then hold onto the back of the chair and the front of the seat to slowly lower yourself.   You should never feel like you are falling into your chair.  USING A WALKER ON STEPS   Before attempting to use your walker on steps, practice with your physical therapist.   If you are going up a step wide enough to accommodate the entire walker and yourself:   First, place the walker up on the step.   Second, get your feet as close to the step as you can.   Third, press down on the walker with your hands as you step up with your stronger leg. Then step up with your weaker leg.   If you are going down a step wide enough to accommodate the entire walker and yourself:   First, place  the walker down on the step.   Second, hold onto the walker as you step down with your weaker leg. Then step down with your stronger leg.   If you are going up more than 1 step and have a railing:   First, turn the walker sideways, so the opening is facing in toward you.   Second, place the front 2 legs of the walker on the first step. These front legs should be positioned at the base of the next step.   Third, test the steadiness of the walker. It should feel sturdy when you press down on the handgrip that is facing the top of the steps.   Finally, placing your weight on the railing and the walker, step up with your stronger leg first. Then step up with your weaker leg.   If you are going down more than 1 step and have a railing:   First, turn the walker sideways, so the opening is facing in toward you.   Second, place the front 2 legs of the walker down on the first step. When possible, the back legs of the walker should be positioned at the base of the  previous step.   Third, test the steadiness of the walker. It should feel sturdy when you press down on the handgrip that is facing the top of the steps.   Finally, placing your weight on the railing and the walker, step down with your weaker leg first. Then step down with your stronger leg.   Be sure to check the sturdiness of the walker before each step.   Make sure you have good rubber tips on the legs of your walker to prevent it from slipping.  Document Released: 05/28/2005 Document Revised: 05/17/2011 Document Reviewed: 12/05/2010 [facility] Patient Information 2012 [place], [place].

## 2011-12-08 NOTE — ED Notes (Signed)
Granddaughter called to pick up grandmother, will call her back [phone]

## 2011-12-08 NOTE — ED Provider Notes (Signed)
History     CSN: [id number]  Arrival date & time 12/08/11  1859   First MD Initiated Contact with Patient 12/08/11 1901      Chief Complaint  Patient presents with  . Ankle Pain  . Fall    (Consider location/radiation/quality/duration/timing/severity/associated sxs/prior treatment) Patient is a 78 y.o. female presenting with ankle pain and fall. The history is provided by the patient.  Ankle Pain   Fall Pertinent negatives include no fever, no abdominal pain and no headaches.  pt indicates slipped on wet spot on floor at home, causing to fall to ground with left leg getting caught under and behind her. C/o pain to left knee and lower leg. Constant, severe pain to area, worse w palpation or movement. No hip or ankle pain. Bruise to right forearm, but otherwise denies other injury. No faintness or dizziness. No head injury or loc. No headache. No neck or back pain. Denies numbness/weakness. Hx bil tka [facility]).   Past Medical History  Diagnosis Date  . Arteriosclerotic cardiovascular disease (ASCVD)     Cath in 9/99-70% mid LAD with diffuse distal disease, 70% T1, 60% mid circumflex, 50% mid RCA, normal ejection fraction; negative stress nuclear in 07/2008  . Sinoatrial node dysfunction 2003    Medtronic dual-chamber device  . Cerebrovascular disease     carotid bruits; no focal disease in 1999, 2002 and 2006  . ANXIETY   . Diastolic CHF, chronic     Normal EF  . COPD   . Hyperlipidemia   . Hypertension   . Hypothyroidism   . Gastroesophageal reflux disease     With hiatal hernia; irritable bowel syndrome; H. pylori-treated; Colonoscopy 2008: non-specific colitis, IH, Diverticulosis, Rectal ulcer secondary to ASA  . OSTEOARTHRITIS, KNEES, BILATERAL     Bilateral TKA  . VITAMIN B12 DEFICIENCY   . Carpal tunnel syndrome   . Peripheral neuropathy   . Diabetes mellitus     Insulin treatment  . Pacemaker     Past Surgical History  Procedure Date  . Total knee arthroplasty      Bilateral  . Nasal fracture surgery   . Cholecystectomy   . Abdominal hysterectomy   . Hemorrhoid surgery   . Carpal tunnel release   . Thyroidectomy 1980    Goiter  . Bladder repair   . Cardiac pacemaker placement 2003    Medtronic, dual-chamber  . Colonoscopy 2008    Family History  Problem Relation Age of Onset  . COPD Father   . Heart failure Mother   . Cancer Sister     colon  . Cancer Brother     lung    History  Substance Use Topics  . Smoking status: Never Smoker   . Smokeless tobacco: Not on file   Comment: Retired- lives with spouse (who has dementia). Pt has 4 children  . Alcohol Use: No    OB History    Grav Para Term Preterm Abortions TAB SAB Ect Mult Living                  Review of Systems  Constitutional: Negative for fever and chills.  HENT: Negative for neck pain.   Eyes: Negative for visual disturbance.  Respiratory: Negative for shortness of breath.   Cardiovascular: Negative for chest pain.  Gastrointestinal: Negative for abdominal pain.  Genitourinary: Negative for flank pain.  Musculoskeletal: Negative for back pain.  Skin: Negative for rash.  Neurological: Negative for headaches.  Hematological: Does not bruise/bleed easily.  Psychiatric/Behavioral: Negative for confusion.    Allergies  Penicillins  Home Medications   Current Outpatient Rx  Name Route Sig Dispense Refill  . IPRATROPIUM-ALBUTEROL 18-103 MCG/ACT IN AERO Inhalation Inhale 2 puffs into the lungs every 6 (six) hours as needed.      . AMARYL 2 MG PO TABS  TAKE 1 TABLET DAILY IN THE MORNING FOR DIABETES 90 each 1  . ASPIRIN 81 MG PO TABS Oral Take 81 mg by mouth daily.      Marland Kitchen CLOTRIMAZOLE 1 % EX CREA Topical Apply topically 2 (two) times daily. 30 g 0  . FUROSEMIDE 40 MG PO TABS Oral Take 40 mg by mouth daily. Twice daily only if needed     . HUMULIN N 100 UNIT/ML [place] SUSP  INJECT 20 UNITS SQ TWICE A DAY BEFORE MEALS 40 mL 1  . KLOR-CON M20 20 MEQ PO TBCR  TAKE 1  TABLET DAILY 90 each 1  . LANCETS MISC      . LEVOTHYROXINE SODIUM 200 MCG PO TABS Oral Take 1 tablet (200 mcg total) by mouth daily. 90 tablet 1  . LEVOTHYROXINE SODIUM 25 MCG PO TABS Oral Take 1 tablet (25 mcg total) by mouth daily. As directed with 200 mcg 90 tablet 1  . LOTREL 10-20 MG PO CAPS  TAKE (1) CAPSULE DAILY 90 each 1  . METOPROLOL SUCCINATE ER 25 MG PO TB24 Oral Take 1 tablet (25 mg total) by mouth daily. Take with or immediately following a meal. 90 tablet 3  . NEURONTIN 300 MG PO CAPS  TAKE 1 CAPSULE AT BEDTIME 90 each 1  . NITROGLYCERIN 0.4 MG/SPRAY TL SOLN Sublingual Place 1 spray under the tongue every 5 (five) minutes as needed.      . NYSTATIN 100000 UNIT/GM EX POWD  Apply to affected area 3 times daily 60 g 1  . PRAVACHOL 80 MG PO TABS  TAKE 1 TABLET AT BEDTIME FOR CHOLESTEROL 90 each 1  . PRILOSEC 20 MG PO CPDR  TAKE 1 CAPSULE ONCE A DAY FOR STOMACH 90 each 1  . SOLIFENACIN SUCCINATE 5 MG PO TABS Oral Take 5 mg by mouth daily.    . SYRINGE/NEEDLE (DISP) 23G X 1-1/4" 3 ML MISC Does not apply by Does not apply route every 30 (thirty) days. To dispense B12 monthly     . TRIAMCINOLONE ACETONIDE 0.1 % EX CREA  APPLY TO AFFECTED SKIN TWICE A DAY AS NEEDED FOR ITCH 453.6 g 0  . VENTOLIN HFA 108 (90 BASE) MCG/ACT IN AERS  INHALE 2 PUFFS 4 TIMES A DAY AS DIRECTED 8.5 each 1  . VICODIN 5-500 MG PO TABS  TAKE 1/2 TO 1 TABLET EVERY 8 HOURS AS NEEDED FOR PAIN 120 each 1  . VITAMIN E 100 UNITS PO CAPS Oral Take 100 Units by mouth daily.      Marland Kitchen XANAX 0.5 MG PO TABS  TAKE 1 TABLET TWICE A DAY AS NEEDED FOR SLEEP OR ANXIETY 180 each 1  . ZOLPIDEM TARTRATE 5 MG PO TABS Oral Take 1 tablet (5 mg total) by mouth at bedtime as needed for sleep. 90 tablet 1    There were no vitals taken for this visit.  Physical Exam  Nursing note and vitals reviewed. Constitutional: She is oriented to person, place, and time. She appears well-developed and well-nourished.  HENT:  Head: Atraumatic.  Eyes:  Conjunctivae are normal. Pupils are equal, round, and reactive to light. No scleral icterus.  Neck: Neck supple.  No tracheal deviation present.  Cardiovascular: Normal rate, regular rhythm, normal heart sounds and intact distal pulses.   Pulmonary/Chest: Effort normal and breath sounds normal. No respiratory distress. She exhibits no tenderness.  Abdominal: Soft. Normal appearance. She exhibits no distension. There is no tenderness.  Musculoskeletal:       Tenderness to left knee and mid to proximal left tib fib. Swelling to area, no tense swelling, compartments soft. Pt w not tolerate/allow any movement at knee. No ankle tenderness. Distal pulses palp. CTLS spine, non tender, aligned, no step off. Contusion/bruise to right forearm, good rom without pain, no focal bony tenderness   Neurological: She is alert and oriented to person, place, and time.       Motor intact bil   Skin: Skin is warm and dry. No rash noted. She is not diaphoretic.  Psychiatric: She has a normal mood and affect.    ED Course  Procedures (including critical care time)     MDM  Iv ns. Morphine iv. zofran iv. Ice/elevate. Xrays.   Recheck pain improved. No hip or ankle pain. Distal pulses palp.    Recheck no increased swelling in and about knee. Compartments soft/not tense, distal pulses palp.   Pt lives w spouse, has family in area. Currently has home health nurse for spouse at home, will make referral to advanced home care for tomorrow re walker, wheelchair, bedside commode, home health eval, pt eval.    Discussed pt ortho on call, Dr Romeo Apple, including xray findings, he indicates he will follow up in office this week.        Suzi Roots, MD 12/09/11 [phone]

## 2011-12-08 NOTE — ED Notes (Addendum)
Pt slipped while taken trash out and slipped. Pt now c/o left ankle and shin pain.

## 2011-12-08 NOTE — ED Notes (Signed)
Waiting on ems

## 2011-12-08 NOTE — ED Notes (Signed)
Pt requesting more pain meds when she returned from xray. EDP notified.

## 2011-12-10 ENCOUNTER — Encounter: Payer: Self-pay | Admitting: Orthopedic Surgery

## 2011-12-10 ENCOUNTER — Ambulatory Visit (INDEPENDENT_AMBULATORY_CARE_PROVIDER_SITE_OTHER): Payer: Medicare Other | Admitting: Orthopedic Surgery

## 2011-12-10 VITALS — BP 102/60 | Ht 70.0 in | Wt 254.0 lb

## 2011-12-10 DIAGNOSIS — S93409A Sprain of unspecified ligament of unspecified ankle, initial encounter: Secondary | ICD-10-CM

## 2011-12-10 DIAGNOSIS — S82409A Unspecified fracture of shaft of unspecified fibula, initial encounter for closed fracture: Secondary | ICD-10-CM

## 2011-12-10 MED FILL — Hydrocodone-Acetaminophen Tab 5-325 MG: ORAL | Qty: 6 | Status: AC

## 2011-12-10 NOTE — Progress Notes (Signed)
  Subjective:    Patient ID: [name] [name], female    DOB: 10/22/1932, 78 y.o.   MRN: [id number] Chief Complaint  Patient presents with  . Leg Injury    fracture left leg, DOI 12/08/11    HPI  78 year-old female fell June 29 after she sprayed for aches she slipped on the occult and her legs folded up under her. She had a total knee many years ago she denies a proximal fibular fracture she complains of lateral ankle pain and radicular pain in her left leg she also complains of some back pain. She does have some sharp 9/10 intermittent pain in the left lower extremity associated with some bruising in the foot as well as the lateral portion of the leg she is having difficulty ambulating and needs a lot of assistance. Review of systems   Review of Systems  Constitutional: Negative.   HENT: Negative.   Eyes: Negative.   Respiratory: Positive for wheezing.   Gastrointestinal:       Heartburn  Genitourinary: Positive for frequency.  Skin: Negative.   Neurological: Positive for numbness.  Hematological: Bruises/bleeds easily.  Psychiatric/Behavioral: The patient is hyperactive.        Objective:   Physical Exam  Constitutional: She is oriented to person, place, and time. She appears well-developed and well-nourished.  Cardiovascular: Normal rate, regular rhythm and intact distal pulses.   Musculoskeletal:       Right shoulder: Normal.       Left shoulder: Normal.       Right elbow: Normal.      Left elbow: Normal.       Right hip: Normal.       Left hip: Normal.       Right knee: Normal.       Left knee: She exhibits swelling, ecchymosis and bony tenderness. She exhibits normal range of motion, no effusion, no deformity, no laceration, no erythema, normal alignment, no LCL laxity, normal patellar mobility, normal meniscus and no MCL laxity. tenderness found. No medial joint line, no lateral joint line, no MCL, no LCL and no patellar tendon tenderness noted.       Right ankle: Normal.       Left ankle: She exhibits decreased range of motion, swelling and ecchymosis. She exhibits no deformity, no laceration and normal pulse. tenderness. AITFL and proximal fibula tenderness found. No lateral malleolus, no medial malleolus, no CF ligament, no posterior TFL and no head of 5th metatarsal tenderness found. Achilles tendon exhibits normal Thompson's test results.       Ambulation is abnormal she can weight-bear on the right has difficulty weightbearing on the left needs a walker and a wheelchair to mobilize  She is having tenderness over the fibula and also over the lateral ankle near the anterior talofibular ligament has painful range of motion of that joint.  Neurological: She is alert and oriented to person, place, and time. She has normal reflexes.  Skin: Skin is warm and dry.  Psychiatric: She has a normal mood and affect. Her behavior is normal.   X-rays proximal nondisplaced comminuted fibular fracture negative ankle fracture       Assessment & Plan:  #1 fibular shaft fracture  #2 left ankle sprain. No medial ankle tenderness and no syndesmotic tenderness rules out possibility of Weber C. type ankle injury  ASO brace left ankle  Knee immobilizer left knee. Weight bear as tolerated.

## 2011-12-10 NOTE — Patient Instructions (Addendum)
Use walker  Wear brace on left leg and left ankle

## 2011-12-11 ENCOUNTER — Telehealth: Payer: Self-pay | Admitting: *Deleted

## 2011-12-11 NOTE — Telephone Encounter (Signed)
[facility] RN called to see if HHA can be added to services to help with Personal Care and bathing-pt recently was admitted to hospital for broken leg-okay for verbal order?

## 2011-12-12 ENCOUNTER — Telehealth: Payer: Self-pay | Admitting: Orthopedic Surgery

## 2011-12-12 NOTE — Telephone Encounter (Signed)
[name] [name] says she can now walk to her bed and is able to get in it.  She is asking: 1.  Can she sleep in the bed now that she can get in it, or do you want her to sleep in her lift chair?  2.  Can she remove the knee brace while in bed (or lift chair)?

## 2011-12-12 NOTE — Telephone Encounter (Signed)
YES

## 2011-12-12 NOTE — Telephone Encounter (Signed)
Patient advised.

## 2011-12-12 NOTE — Telephone Encounter (Signed)
HHRN informed of verbal order.  

## 2011-12-12 NOTE — Telephone Encounter (Signed)
yes

## 2011-12-24 ENCOUNTER — Encounter: Payer: Self-pay | Admitting: Orthopedic Surgery

## 2011-12-24 ENCOUNTER — Ambulatory Visit (INDEPENDENT_AMBULATORY_CARE_PROVIDER_SITE_OTHER): Payer: Medicare Other | Admitting: Orthopedic Surgery

## 2011-12-24 VITALS — BP 140/60 | Ht 70.0 in | Wt 254.0 lb

## 2011-12-24 DIAGNOSIS — S82839A Other fracture of upper and lower end of unspecified fibula, initial encounter for closed fracture: Secondary | ICD-10-CM | POA: Insufficient documentation

## 2011-12-24 DIAGNOSIS — S93409A Sprain of unspecified ligament of unspecified ankle, initial encounter: Secondary | ICD-10-CM

## 2011-12-24 NOTE — Patient Instructions (Addendum)
Wear ankle brace for 2 weeks

## 2011-12-24 NOTE — Progress Notes (Signed)
Subjective:     Patient ID: [name] [name], female   DOB: [birth year], 78 y.o.   MRN: [id number] No chief complaint on file.   HPI Followup visit status post fall into the left proximal fibula with a shaft fracture with no medial ankle symptoms also sprain the lateral ligaments and anterior ligaments of the ankle  Initial treatment straight leg brace for the fibular fracture and an ASO brace for the ankle fracture she was placed on a walker full weightbearing  She says her knee doesn't hurt she has some burning in her ankle  Review of Systems There is no catching locking or giving way of the knee    Objective:   Physical Exam Nontender fibula no swelling knee flexion 110 seems to be back to normal  Left ankle mild tenderness slight loss of motion normal stability and strength normal skin normal distal vascularity    Assessment:     #1 fibular fracture healing no longer symptomatic  #2 ankle sprain improving continue ASO brace    Plan:     Remove knee immobilizer continue ASO brace for 2 more weeks no followup needed

## 2011-12-26 ENCOUNTER — Other Ambulatory Visit: Payer: Self-pay | Admitting: Cardiology

## 2011-12-26 DIAGNOSIS — R269 Unspecified abnormalities of gait and mobility: Secondary | ICD-10-CM

## 2011-12-26 DIAGNOSIS — S8290XD Unspecified fracture of unspecified lower leg, subsequent encounter for closed fracture with routine healing: Secondary | ICD-10-CM

## 2011-12-26 DIAGNOSIS — M6281 Muscle weakness (generalized): Secondary | ICD-10-CM

## 2011-12-26 DIAGNOSIS — G8918 Other acute postprocedural pain: Secondary | ICD-10-CM

## 2011-12-27 ENCOUNTER — Other Ambulatory Visit: Payer: Self-pay | Admitting: Internal Medicine

## 2011-12-28 ENCOUNTER — Other Ambulatory Visit: Payer: Self-pay | Admitting: Internal Medicine

## 2011-12-28 NOTE — Telephone Encounter (Signed)
Ok to refill per Ferdie Ping

## 2012-01-07 ENCOUNTER — Telehealth: Payer: Self-pay | Admitting: Cardiology

## 2012-01-07 ENCOUNTER — Ambulatory Visit (INDEPENDENT_AMBULATORY_CARE_PROVIDER_SITE_OTHER): Payer: Medicare Other | Admitting: Internal Medicine

## 2012-01-07 ENCOUNTER — Encounter: Payer: Self-pay | Admitting: Internal Medicine

## 2012-01-07 VITALS — BP 120/72 | HR 81 | Temp 97.7°F | Ht 70.0 in | Wt 251.0 lb

## 2012-01-07 DIAGNOSIS — R300 Dysuria: Secondary | ICD-10-CM

## 2012-01-07 DIAGNOSIS — M25579 Pain in unspecified ankle and joints of unspecified foot: Secondary | ICD-10-CM

## 2012-01-07 DIAGNOSIS — M792 Neuralgia and neuritis, unspecified: Secondary | ICD-10-CM

## 2012-01-07 DIAGNOSIS — IMO0002 Reserved for concepts with insufficient information to code with codable children: Secondary | ICD-10-CM

## 2012-01-07 DIAGNOSIS — E119 Type 2 diabetes mellitus without complications: Secondary | ICD-10-CM

## 2012-01-07 DIAGNOSIS — M25572 Pain in left ankle and joints of left foot: Secondary | ICD-10-CM

## 2012-01-07 DIAGNOSIS — I10 Essential (primary) hypertension: Secondary | ICD-10-CM

## 2012-01-07 MED ORDER — NITROFURANTOIN MACROCRYSTAL 100 MG PO CAPS: 100.0000 mg | ORAL_CAPSULE | Freq: Two times a day (BID) | ORAL | Status: DC

## 2012-01-07 MED ORDER — AMLODIPINE BESY-BENAZEPRIL HCL 10-20 MG PO CAPS: 1.0000 | ORAL_CAPSULE | Freq: Every day | ORAL | Status: DC

## 2012-01-07 MED ORDER — GABAPENTIN 300 MG PO CAPS: 300.0000 mg | ORAL_CAPSULE | Freq: Three times a day (TID) | ORAL | Status: DC

## 2012-01-07 MED ORDER — POTASSIUM CHLORIDE CRYS ER 20 MEQ PO TBCR: 20.0000 meq | EXTENDED_RELEASE_TABLET | Freq: Every day | ORAL | Status: DC

## 2012-01-07 MED ORDER — METOPROLOL SUCCINATE ER 25 MG PO TB24
25.0000 mg | ORAL_TABLET | Freq: Every day | ORAL | Status: DC
Start: 2012-01-07 — End: 2012-10-08

## 2012-01-07 MED ORDER — PRAVASTATIN SODIUM 80 MG PO TABS: 80.0000 mg | ORAL_TABLET | Freq: Every day | ORAL | Status: DC

## 2012-01-07 NOTE — Telephone Encounter (Signed)
Patient would like refills for 90 day supply on all medicines that were prescribed by Dr.Rothbart called to [facility]. / tg

## 2012-01-07 NOTE — Progress Notes (Signed)
Subjective:    Patient ID: [name] [name], female    DOB: 01-Nov-1932, 78 y.o.   MRN: [id number]  HPI complains of L foot pain Reviewed interval hx: fib fx 11/30/11 after injury -  Ongoing conservative care: s/p knee brace immobilizer and ASO (was to remove today) No new injury, no swelling but "burning pain" lateral ankle and foot - radiates into 4th/5th toes Pain improved with ice pack and gabapentin>hydrocodone  Past Medical History  Diagnosis Date  . Arteriosclerotic cardiovascular disease (ASCVD)     Cath in 9/99-70% mid LAD with diffuse distal disease, 70% T1, 60% mid circumflex, 50% mid RCA, normal ejection fraction; negative stress nuclear in 07/2008  . Sinoatrial node dysfunction 2003    Medtronic dual-chamber device  . Cerebrovascular disease     carotid bruits; no focal disease in 1999, 2002 and 2006  . ANXIETY   . Diastolic CHF, chronic     Normal EF  . COPD   . Hyperlipidemia   . Hypertension   . Hypothyroidism   . Gastroesophageal reflux disease     With hiatal hernia; irritable bowel syndrome; H. pylori-treated; Colonoscopy 2008: non-specific colitis, IH, Diverticulosis, Rectal ulcer secondary to ASA  . OSTEOARTHRITIS, KNEES, BILATERAL     Bilateral TKA  . VITAMIN B12 DEFICIENCY   . Carpal tunnel syndrome   . Peripheral neuropathy   . Diabetes mellitus     Insulin treatment  . Pacemaker   . Closed left fibular fracture 11/30/11      Review of Systems  Genitourinary: Positive for dysuria and urgency. Negative for difficulty urinating.  Musculoskeletal: Negative for myalgias, back pain and gait problem.  Neurological: Negative for light-headedness and headaches.       Objective:   Physical Exam BP 120/72  Pulse 81  Temp 97.7 F (36.5 C) (Oral)  Ht 5\' 10"  (1.778 m)  Wt 251 lb (113.853 kg)  BMI 36.01 kg/m2  SpO2 96% Wt Readings from Last 3 Encounters:  01/07/12 251 lb (113.853 kg)  12/24/11 254 lb (115.214 kg)  12/10/11 254 lb (115.214 kg)    Constitutional: She appears well-developed and well-nourished. No distress.  Neck: Normal range of motion. Neck supple. No JVD present. No thyromegaly present.  Cardiovascular: Normal rate, regular rhythm and normal heart sounds.  No murmur heard. No BLE edema. Pulmonary/Chest: Effort normal and breath sounds normal. No respiratory distress. She has no wheezes. Musculoskeletal: L foot without deformity - FROM and neurovasc intact distally Skin: Skin is warm and dry. No rash noted. No erythema.  Psychiatric: She has a normal mood and affect. Her behavior is normal. Judgment and thought content normal.   Lab Results  Component Value Date   WBC 6.5 12/04/2010   HGB 14.1 12/04/2010   HCT 43.1 12/04/2010   PLT 256 12/04/2010   GLUCOSE 111* 12/04/2010   CHOL 162 10/22/2011   TRIG 159.0* 10/22/2011   HDL 48.80 10/22/2011   LDLCALC 81 10/22/2011   ALT 27 12/18/2009   AST 33 12/18/2009   NA 138 12/04/2010   K 5.1 12/04/2010   CL 102 12/04/2010   CREATININE 1.05 12/04/2010   BUN 12 12/04/2010   CO2 26 12/04/2010   TSH 0.55 10/22/2011   INR 1.07 12/04/2010   HGBA1C 7.3* 10/22/2011   MICROALBUR 0.8 07/23/2011   Dg Tibia/fibula Left  12/08/2011  *RADIOLOGY REPORT*  Clinical Data: Need pain.  LEFT TIBIA AND FIBULA - 2 VIEW  Comparison: None.  Findings: Previous total knee replacement.  There  is a comminuted fracture of the proximal fibula without significant angulation or displacement.  No fractures seen distal to that.  IMPRESSION: Comminuted fracture of the proximal fibula without significant angulation or displacement.  Original Report Authenticated By: Thomasenia Sales, M.D.   Dg Knee Complete 4 Views Left  12/08/2011  *RADIOLOGY REPORT*  Clinical Data: Total knee replacement.  Fell with pain.  LEFT KNEE - COMPLETE 4+ VIEW  Comparison: None.  Findings: Total knee replacement has a good appearance.  No fracture of the femur or tibia.  Comminuted fracture of the posterior fibula without significant angulation  or displacement.  IMPRESSION: Proximal fibular fracture.  The femur and tibia negative.  Total knee arthroplasty appears unaffected.  Original Report Authenticated By: Thomasenia Sales, M.D.     Assessment & Plan:   L ankle burning - suspect neuropathy - ?traumatic related to recent accidental injury 11/2011- Increase neurontin to TID and follow up sports med as ongoing - continue ACO for support until then  Dysuria - recurrent UTI in past - empiric antibiotics - erx done  DM2 - hx neuropathy - follow up next month for labs as planned - reports cbgs "good"

## 2012-01-07 NOTE — Patient Instructions (Signed)
It was good to see you today. Increase gabapentin to 2-3x/day for nerve pain in your ankle Use macrodantin 2x/day x 1 week for bladder antibiotics Your prescription(s) have been submitted to your pharmacy. Please take as directed and contact our office if you believe you are having problem(s) with the medication(s). Call Dr Romeo Apple for your foot if pain is worse or unimproved with this treatment Other Medications reviewed, no changes at this time.  Please keep scheduled followup for diabetes mellitus as planned, call sooner if problems.

## 2012-01-21 ENCOUNTER — Ambulatory Visit: Payer: Medicare Other | Admitting: Internal Medicine

## 2012-01-24 ENCOUNTER — Encounter: Payer: Self-pay | Admitting: Internal Medicine

## 2012-01-24 ENCOUNTER — Other Ambulatory Visit (INDEPENDENT_AMBULATORY_CARE_PROVIDER_SITE_OTHER): Payer: Medicare Other

## 2012-01-24 ENCOUNTER — Ambulatory Visit (INDEPENDENT_AMBULATORY_CARE_PROVIDER_SITE_OTHER): Payer: Medicare Other | Admitting: Internal Medicine

## 2012-01-24 VITALS — BP 130/72 | HR 83 | Temp 97.7°F | Wt 250.0 lb

## 2012-01-24 DIAGNOSIS — E1149 Type 2 diabetes mellitus with other diabetic neurological complication: Secondary | ICD-10-CM

## 2012-01-24 DIAGNOSIS — I10 Essential (primary) hypertension: Secondary | ICD-10-CM

## 2012-01-24 DIAGNOSIS — E1142 Type 2 diabetes mellitus with diabetic polyneuropathy: Secondary | ICD-10-CM

## 2012-01-24 DIAGNOSIS — R300 Dysuria: Secondary | ICD-10-CM

## 2012-01-24 DIAGNOSIS — E039 Hypothyroidism, unspecified: Secondary | ICD-10-CM

## 2012-01-24 LAB — URINALYSIS, ROUTINE W REFLEX MICROSCOPIC
Hgb urine dipstick: NEGATIVE
Leukocytes, UA: NEGATIVE
Specific Gravity, Urine: 1.01 (ref 1.000–1.030)
Urobilinogen, UA: 1 (ref 0.0–1.0)

## 2012-01-24 LAB — TSH: TSH: 0.69 u[IU]/mL (ref 0.35–5.50)

## 2012-01-24 LAB — HEMOGLOBIN A1C: Hgb A1c MFr Bld: 6.8 % — ABNORMAL HIGH (ref 4.6–6.5)

## 2012-01-24 NOTE — Progress Notes (Signed)
Subjective:    Patient ID: [name] [name], female    DOB: Sep 01, 1932, 78 y.o.   MRN: [id number]  HPI Here for follow up -reviewed chronic medical issues:  HTN - reports compliance with ongoing medical treatment; recent changes in medication reviewed - prev off amlodipine in lotrel due to edema - then resumed lotrel due to intolerance of dilt - the patient reports compliance with medication(s) as prescribed. Denies adverse side effects.  hypothyroid - reports compliance with ongoing medical treatment; no changes in medication dose or frequency. denies adverse side effects related to current therapy.  DM2 - on insulin and oral meds - reports compliance with ongoing medical treatment; interval changes in medication reviewed - off actos due to fear side effects, on generic amaryl and insulin. denies adverse side effects related to current therapy. no hypoglycemia symptoms or events - checks cbg 2x/d before meals -   dyslipidemia - dose statin increased by cards 07/10/10 - improved FLP in 08/2010 at recheck - the patient reports compliance with medication(s) as prescribed. Denies adverse side effects.   Past Medical History  Diagnosis Date  . Arteriosclerotic cardiovascular disease (ASCVD)     Cath in 9/99-70% mid LAD with diffuse distal disease, 70% T1, 60% mid circumflex, 50% mid RCA, normal ejection fraction; negative stress nuclear in 07/2008  . Sinoatrial node dysfunction 2003    Medtronic dual-chamber device  . Cerebrovascular disease     carotid bruits; no focal disease in 1999, 2002 and 2006  . ANXIETY   . Diastolic CHF, chronic     Normal EF  . COPD   . Hyperlipidemia   . Hypertension   . Hypothyroidism   . Gastroesophageal reflux disease     With hiatal hernia; irritable bowel syndrome; H. pylori-treated; Colonoscopy 2008: non-specific colitis, IH, Diverticulosis, Rectal ulcer secondary to ASA  . OSTEOARTHRITIS, KNEES, BILATERAL     Bilateral TKA  . VITAMIN B12 DEFICIENCY   .  Carpal tunnel syndrome   . Peripheral neuropathy   . Diabetes mellitus     Insulin treatment  . Pacemaker   . Closed left fibular fracture 11/30/11    Review of Systems  Cardiovascular: Negative for chest pain.  Gastrointestinal: Positive for constipation. Negative for vomiting and abdominal pain.  Genitourinary: Negative for dysuria (but on tx for UTI - requests recheck UA), difficulty urinating and pelvic pain.  Neurological: Negative for weakness and headaches.       Objective:   Physical Exam  BP 130/72  Pulse 83  Temp 97.7 F (36.5 C) (Oral)  Wt 250 lb (113.399 kg)  SpO2 95% Wt Readings from Last 3 Encounters:  01/24/12 250 lb (113.399 kg)  01/07/12 251 lb (113.853 kg)  12/24/11 254 lb (115.214 kg)   Constitutional: She is obese; appears well-developed and well-nourished. No distress. spouse at side Neck: Normal range of motion. Neck supple. No JVD present. No thyromegaly present.  Cardiovascular: Normal rate, regular rhythm and normal heart sounds.  No murmur heard. No BLE edema. Pulmonary/Chest: Effort normal and breath sounds normal. No respiratory distress. She has no wheezes.  Psychiatric: She has a normal mood and affect. Her behavior is normal. Judgment and thought content normal.      Lab Results  Component Value Date   WBC 6.5 12/04/2010   HGB 14.1 12/04/2010   HCT 43.1 12/04/2010   PLT 256 12/04/2010   CHOL 162 10/22/2011   TRIG 159.0* 10/22/2011   HDL 48.80 10/22/2011   ALT 27 12/18/2009  AST 33 12/18/2009   NA 138 12/04/2010   K 5.1 12/04/2010   CL 102 12/04/2010   CREATININE 1.05 12/04/2010   BUN 12 12/04/2010   CO2 26 12/04/2010   TSH 0.55 10/22/2011   INR 1.07 12/04/2010   HGBA1C 7.3* 10/22/2011   MICROALBUR 0.8 07/23/2011    Assessment & Plan:  See problem list. Medications and labs reviewed today.  UTI - recheck UA per request though symptoms resolved

## 2012-01-24 NOTE — Assessment & Plan Note (Signed)
stable overall by hx and exam combined amlodipine and ACEI fall 2012  BP Readings from Last 3 Encounters:  01/24/12 130/72  01/07/12 120/72  12/24/11 140/60

## 2012-01-24 NOTE — Patient Instructions (Addendum)
It was good to see you today. Medications reviewed, no changes at this time. Test(s) ordered today. Your results will be called to you after review (48-72hours after test completion). If any changes need to be made, you will be notified at that time. Please schedule followup in 6 months for diabetes check, call sooner if problems.

## 2012-01-24 NOTE — Assessment & Plan Note (Signed)
Difficult with medication splitting - but otherwise doing well Check lab today - adjust as needed Lab Results  Component Value Date   TSH 0.55 10/22/2011

## 2012-01-24 NOTE — Assessment & Plan Note (Signed)
The current medical regimen is effective;  continue present plan and medications. On ASA, ACEI, statin Lab Results  Component Value Date   HGBA1C 7.3* 10/22/2011

## 2012-01-25 ENCOUNTER — Other Ambulatory Visit: Payer: Self-pay | Admitting: Internal Medicine

## 2012-03-10 ENCOUNTER — Other Ambulatory Visit: Payer: Self-pay | Admitting: Internal Medicine

## 2012-03-10 NOTE — Telephone Encounter (Signed)
Last written 09/25/2011 #180 with 1 refill-please advise.

## 2012-03-11 NOTE — Telephone Encounter (Signed)
Rx faxed to [facility].

## 2012-03-12 ENCOUNTER — Other Ambulatory Visit: Payer: Self-pay | Admitting: Internal Medicine

## 2012-03-12 NOTE — Telephone Encounter (Signed)
Last written 12/03/2011 #120 with 1 refill-please advise.

## 2012-03-13 NOTE — Telephone Encounter (Signed)
Rx faxed to [facility].

## 2012-04-15 ENCOUNTER — Ambulatory Visit: Payer: Medicare Other | Admitting: Cardiology

## 2012-04-22 ENCOUNTER — Ambulatory Visit (INDEPENDENT_AMBULATORY_CARE_PROVIDER_SITE_OTHER): Payer: Medicare Other | Admitting: Internal Medicine

## 2012-04-22 ENCOUNTER — Encounter: Payer: Self-pay | Admitting: Internal Medicine

## 2012-04-22 VITALS — BP 134/72 | HR 81 | Temp 96.8°F | Ht 70.0 in | Wt 249.0 lb

## 2012-04-22 DIAGNOSIS — N3090 Cystitis, unspecified without hematuria: Secondary | ICD-10-CM

## 2012-04-22 DIAGNOSIS — R3915 Urgency of urination: Secondary | ICD-10-CM

## 2012-04-22 DIAGNOSIS — R350 Frequency of micturition: Secondary | ICD-10-CM

## 2012-04-22 DIAGNOSIS — R300 Dysuria: Secondary | ICD-10-CM

## 2012-04-22 LAB — POCT URINALYSIS DIPSTICK
Bilirubin, UA: NEGATIVE
Blood, UA: NEGATIVE
Glucose, UA: NEGATIVE
Spec Grav, UA: 1.02
Urobilinogen, UA: 0.2

## 2012-04-22 MED ORDER — PHENAZOPYRIDINE HCL 200 MG PO TABS: 200.0000 mg | ORAL_TABLET | Freq: Three times a day (TID) | ORAL | Status: DC | PRN

## 2012-04-22 MED ORDER — NITROFURANTOIN MACROCRYSTAL 100 MG PO CAPS: 100.0000 mg | ORAL_CAPSULE | Freq: Two times a day (BID) | ORAL | Status: DC

## 2012-04-22 NOTE — Addendum Note (Signed)
Addended by: Carin Primrose on: 04/22/2012 04:56 PM   Modules accepted: Orders

## 2012-04-22 NOTE — Progress Notes (Signed)
Subjective:    Patient ID: [name] [name], female    DOB: [birth year], 78 y.o.   MRN: [id number]  HPI Pt presents to the clinic today with c/o "pain all over". Pt thinks this may be associated with a urinary tract infection. The symptoms started  2 days ago with lower abdominal pain and going to the bathroom every hour. She has taken Vicodin for the pain and taking cranberry juice. These seem to help a bit. She feels like her urine gets more painful if she drinks coffee. She has not noticed any blood in her urine. She denies fever, chills, nausea, vomiting or back pain.  Review of Systems      Past Medical History  Diagnosis Date  . Arteriosclerotic cardiovascular disease (ASCVD)     Cath in 9/99-70% mid LAD with diffuse distal disease, 70% T1, 60% mid circumflex, 50% mid RCA, normal ejection fraction; negative stress nuclear in 07/2008  . Sinoatrial node dysfunction 2003    Medtronic dual-chamber device  . Cerebrovascular disease     carotid bruits; no focal disease in 1999, 2002 and 2006  . ANXIETY   . Diastolic CHF, chronic     Normal EF  . COPD   . Hyperlipidemia   . Hypertension   . Hypothyroidism   . Gastroesophageal reflux disease     With hiatal hernia; irritable bowel syndrome; H. pylori-treated; Colonoscopy 2008: non-specific colitis, IH, Diverticulosis, Rectal ulcer secondary to ASA  . OSTEOARTHRITIS, KNEES, BILATERAL     Bilateral TKA  . VITAMIN B12 DEFICIENCY   . Carpal tunnel syndrome   . Peripheral neuropathy   . Diabetes mellitus     Insulin treatment  . Pacemaker   . Closed left fibular fracture 11/30/11    Current Outpatient Prescriptions  Medication Sig Dispense Refill  . albuterol (PROVENTIL HFA;VENTOLIN HFA) 108 (90 BASE) MCG/ACT inhaler Inhale 2 puffs into the lungs 4 (four) times daily as needed. Shortness of breath      . albuterol-ipratropium (COMBIVENT) 18-103 MCG/ACT inhaler Inhale 2 puffs into the lungs every 6 (six) hours as needed. Shortness of  breath      . amLODipine-benazepril (LOTREL) 10-20 MG per capsule Take 1 capsule by mouth daily.  90 capsule  3  . aspirin EC 81 MG tablet Take 81 mg by mouth daily.      . cyanocobalamin (,VITAMIN B-12,) 1000 MCG/ML injection INJECT 1 ML IM ONCE A MONTH  1 mL  11  . furosemide (LASIX) 40 MG tablet Take 40 mg by mouth as needed. Fluid retention      . gabapentin (NEURONTIN) 300 MG capsule Take 1 capsule (300 mg total) by mouth 3 (three) times daily.  90 capsule  1  . glimepiride (AMARYL) 2 MG tablet Take 2 mg by mouth daily.      Marland Kitchen HYDROcodone-acetaminophen (VICODIN) 5-500 MG per tablet Take 1-2 tablets by mouth every 6 (six) hours as needed for pain.  20 tablet  0  . HYDROcodone-acetaminophen (VICODIN) 5-500 MG per tablet Take 1 tablet by mouth every 6 (six) hours as needed for pain.  6 tablet  0  . insulin NPH (HUMULIN N,NOVOLIN N) 100 UNIT/ML injection Inject 20 Units into the skin 2 (two) times daily before a meal.      . Lancets MISC 1 each daily.       Marland Kitchen LEVOXYL 200 MCG tablet TAKE 1 TABLET DAILY  90 tablet  2  . LEVOXYL 25 MCG tablet TAKE 1 TABLET DAILY WITH  TAB  90 tablet  2  . LOTRIMIN AF 1 % cream APPLY TOPICALLY 2 TIMES A DAY  1 Tube  3  . metoprolol succinate (TOPROL-XL) 25 MG 24 hr tablet Take 1 tablet (25 mg total) by mouth daily. Take with or immediately following a meal.  90 tablet  3  . MYCOLOG II cream APPLY TO AFFECTED AREA 3 TIMES A DAY FOR 7 DAYS AS NEEDED ONLY  60 g  1  . nitrofurantoin (MACRODANTIN) 100 MG capsule Take 1 capsule (100 mg total) by mouth 2 (two) times daily.  14 capsule  0  . nitroGLYCERIN (NITROLINGUAL) 0.4 MG/SPRAY spray Place 1 spray under the tongue every 5 (five) minutes as needed.        . nystatin (MYCOSTATIN) powder Apply 1 Units topically as needed. rash      . PHENERGAN 25 MG tablet TAKE (1) TABLET EVERY SIX HOURS AS NEEDED FOR NAUSEA.  30 each  0  . potassium chloride SA (K-DUR,KLOR-CON) 20 MEQ tablet Take 1 tablet (20 mEq total) by mouth  daily.  90 tablet  3  . pravastatin (PRAVACHOL) 80 MG tablet Take 1 tablet (80 mg total) by mouth at bedtime. cholesterol  90 tablet  3  . PRILOSEC 20 MG capsule TAKE 1 CAPSULE ONCE A DAY FOR STOMACH  90 capsule  2  . solifenacin (VESICARE) 5 MG tablet Take 5 mg by mouth daily.      . SYRINGE-NEEDLE, DISP, 3 ML (B-D 3CC LUER-LOK SYR 23GX1-1/4) 23G X 1-1/4" 3 ML MISC by Does not apply route every 30 (thirty) days. To dispense B12 monthly       . triamcinolone cream (KENALOG) 0.1 % Apply 1 application topically 2 (two) times daily as needed. itch      . VICODIN 5-500 MG per tablet TAKE 1/2 TO 1 TABLET EVERY 8 HOURS AS NEEDED FOR PAIN  120 tablet  1  . vitamin E 100 UNIT capsule Take 100 Units by mouth daily.       Marland Kitchen XANAX 0.5 MG tablet TAKE 1 TABLET TWICE A DAY AS NEEDED FOR SLEEP OR ANXIETY  180 tablet  1  . zolpidem (AMBIEN) 5 MG tablet Take 1 tablet (5 mg total) by mouth at bedtime as needed for sleep.  90 tablet  1  . [DISCONTINUED] nystatin (MYCOSTATIN) powder Apply to affected area 3 times daily  60 g  1    Allergies  Allergen Reactions  . Penicillins     Family History  Problem Relation Age of Onset  . COPD Father   . Heart failure Mother   . Cancer Sister     colon  . Cancer Brother     lung    History   Social History  . Marital Status: Married    Spouse Name: N/A    Number of Children: N/A  . Years of Education: 8   Occupational History  .     Social History Main Topics  . Smoking status: Never Smoker   . Smokeless tobacco: Not on file     Comment: Retired- lives with spouse (who has dementia). Pt has 4 children  . Alcohol Use: No  . Drug Use: No  . Sexually Active: Not Currently   Other Topics Concern  . Not on file   Social History Narrative  . No narrative on file     Constitutional: Denies fever, malaise, fatigue, headache or abrupt weight changes.   GU: Pt reports urgency, frequency,  pain with urination. Denies burning sensation, blood in urine,  odor or discharge.  No other specific complaints in a complete review of systems (except as listed in HPI above).  Objective:   Physical Exam  There were no vitals taken for this visit. Wt Readings from Last 3 Encounters:  01/24/12 250 lb (113.399 kg)  01/07/12 251 lb (113.853 kg)  12/24/11 254 lb (115.214 kg)    General: Appears her stated age, well developed, well nourished in NAD. Cardiovascular: Normal rate and rhythm. S1,S2 noted.  No murmur, rubs or gallops noted. No JVD or BLE edema. No carotid bruits noted. Pulmonary/Chest: Normal effort and positive vesicular breath sounds. No respiratory distress. No wheezes, rales or ronchi noted.  Abdomen: Soft and nontender. Normal bowel sounds, no bruits noted. No distention or masses noted. Liver, spleen and kidneys non palpable. Tender to palpation over bladder. No CVA tenderness. Musculoskeletal: Normal range of motion. No signs of joint swelling. No difficulty with gait.  Neurological: Alert and oriented. Cranial nerves II-XII intact. Coordination normal. +DTRs bilaterally. Psychiatric: Mood and affect normal. Behavior is normal. Judgment and thought content normal.       Assessment & Plan:   Cysitis Dysuria Urgency Frequency  Urinalysis Drink plenty of fluids Take Pyridium TID for dysuria Take Macrobid x 5 days  RTC as needed

## 2012-04-22 NOTE — Patient Instructions (Signed)
Urinary Tract Infection Urinary tract infections (UTIs) can develop anywhere along your urinary tract. Your urinary tract is your body's drainage system for removing wastes and extra water. Your urinary tract includes two kidneys, two ureters, a bladder, and a urethra. Your kidneys are a pair of bean-shaped organs. Each kidney is about the size of your fist. They are located below your ribs, one on each side of your spine. CAUSES Infections are caused by microbes, which are microscopic organisms, including fungi, viruses, and bacteria. These organisms are so small that they can only be seen through a microscope. Bacteria are the microbes that most commonly cause UTIs. SYMPTOMS  Symptoms of UTIs may vary by age and gender of the patient and by the location of the infection. Symptoms in young women typically include a frequent and intense urge to urinate and a painful, burning feeling in the bladder or urethra during urination. Older women and men are more likely to be tired, shaky, and weak and have muscle aches and abdominal pain. A fever may mean the infection is in your kidneys. Other symptoms of a kidney infection include pain in your back or sides below the ribs, nausea, and vomiting. DIAGNOSIS To diagnose a UTI, your caregiver will ask you about your symptoms. Your caregiver also will ask to provide a urine sample. The urine sample will be tested for bacteria and white blood cells. White blood cells are made by your body to help fight infection. TREATMENT  Typically, UTIs can be treated with medication. Because most UTIs are caused by a bacterial infection, they usually can be treated with the use of antibiotics. The choice of antibiotic and length of treatment depend on your symptoms and the type of bacteria causing your infection. HOME CARE INSTRUCTIONS  If you were prescribed antibiotics, take them exactly as your caregiver instructs you. Finish the medication even if you feel better after you  have only taken some of the medication.  Drink enough water and fluids to keep your urine clear or pale yellow.  Avoid caffeine, tea, and carbonated beverages. They tend to irritate your bladder.  Empty your bladder often. Avoid holding urine for long periods of time.  Empty your bladder before and after sexual intercourse.  After a bowel movement, women should cleanse from front to back. Use each tissue only once. SEEK MEDICAL CARE IF:   You have back pain.  You develop a fever.  Your symptoms do not begin to resolve within 3 days. SEEK IMMEDIATE MEDICAL CARE IF:   You have severe back pain or lower abdominal pain.  You develop chills.  You have nausea or vomiting.  You have continued burning or discomfort with urination. MAKE SURE YOU:   Understand these instructions.  Will watch your condition.  Will get help right away if you are not doing well or get worse. Document Released: 03/07/2005 Document Revised: 11/27/2011 Document Reviewed: 07/06/2011 [employer] Patient Information 2013 [employer].  

## 2012-04-23 ENCOUNTER — Encounter: Payer: Medicare Other | Admitting: Cardiology

## 2012-04-23 ENCOUNTER — Encounter: Payer: Self-pay | Admitting: Cardiology

## 2012-04-24 NOTE — Progress Notes (Signed)
This encounter was created in error - please disregard.

## 2012-05-02 ENCOUNTER — Encounter: Payer: Self-pay | Admitting: *Deleted

## 2012-05-14 ENCOUNTER — Encounter: Payer: Self-pay | Admitting: *Deleted

## 2012-05-19 ENCOUNTER — Ambulatory Visit (INDEPENDENT_AMBULATORY_CARE_PROVIDER_SITE_OTHER): Payer: Medicare Other | Admitting: *Deleted

## 2012-05-19 DIAGNOSIS — I5032 Chronic diastolic (congestive) heart failure: Secondary | ICD-10-CM

## 2012-05-19 DIAGNOSIS — I495 Sick sinus syndrome: Secondary | ICD-10-CM

## 2012-05-19 LAB — PACEMAKER DEVICE OBSERVATION
ATRIAL PACING PM: 62
BAMS-0001: 175 {beats}/min
VENTRICULAR PACING PM: 100

## 2012-05-19 NOTE — Progress Notes (Signed)
PPM check 

## 2012-05-20 ENCOUNTER — Other Ambulatory Visit: Payer: Self-pay | Admitting: Internal Medicine

## 2012-05-23 ENCOUNTER — Ambulatory Visit (INDEPENDENT_AMBULATORY_CARE_PROVIDER_SITE_OTHER): Payer: Medicare Other | Admitting: Cardiology

## 2012-05-23 ENCOUNTER — Encounter: Payer: Self-pay | Admitting: Cardiology

## 2012-05-23 VITALS — BP 132/66 | HR 66 | Ht 62.0 in | Wt 254.4 lb

## 2012-05-23 DIAGNOSIS — I7090 Unspecified atherosclerosis: Secondary | ICD-10-CM

## 2012-05-23 DIAGNOSIS — I10 Essential (primary) hypertension: Secondary | ICD-10-CM

## 2012-05-23 DIAGNOSIS — I5032 Chronic diastolic (congestive) heart failure: Secondary | ICD-10-CM

## 2012-05-23 DIAGNOSIS — E785 Hyperlipidemia, unspecified: Secondary | ICD-10-CM

## 2012-05-23 DIAGNOSIS — I679 Cerebrovascular disease, unspecified: Secondary | ICD-10-CM

## 2012-05-23 DIAGNOSIS — I2510 Atherosclerotic heart disease of native coronary artery without angina pectoris: Secondary | ICD-10-CM

## 2012-05-23 DIAGNOSIS — I495 Sick sinus syndrome: Secondary | ICD-10-CM

## 2012-05-23 NOTE — Assessment & Plan Note (Addendum)
Appreciable stenosis was present in the right ICA nearly 2 years ago.  Repeat carotid ultrasound will be obtained.

## 2012-05-23 NOTE — Progress Notes (Signed)
Patient ID: [name] [name], female   DOB: 08-12-1932, 78 y.o.   MRN: [id number]  HPI: Scheduled return visit for this very nice woman with sick sinus syndrome and hypertension.  Since her last visit, she has done generally well.  She notes intermittent pedal edema for which she uses diuretic rarely.  She developed left chest pain approximately one month ago while mopping her floor for which she took a nitroglycerin with almost immediate relief.  There were no associated symptoms.  She has done fine since.  She is concerned about the number of medications with which she is treated, but on close questioning, it is difficult to determine exactly which medicine she is taking on a daily basis.  She reports substantial urinary frequency and nocturia causing her to arise every hour or 2 at night.  Prior to Admission medications   Medication Sig Start Date End Date Taking? Authorizing Provider  albuterol (PROVENTIL HFA;VENTOLIN HFA) 108 (90 BASE) MCG/ACT inhaler Inhale 2 puffs into the lungs 4 (four) times daily as needed. Shortness of breath   Yes Historical Provider, MD  albuterol-ipratropium (COMBIVENT) 18-103 MCG/ACT inhaler Inhale 2 puffs into the lungs every 6 (six) hours as needed. Shortness of breath   Yes Historical Provider, MD  amLODipine-benazepril (LOTREL) 10-20 MG per capsule Take 1 capsule by mouth daily. 01/07/12  Yes Kathlen Brunswick, MD  aspirin EC 81 MG tablet Take 81 mg by mouth daily.   Yes Historical Provider, MD  cyanocobalamin (,VITAMIN B-12,) 1000 MCG/ML injection INJECT 1 ML IM ONCE A MONTH 01/25/12  Yes Newt Lukes, MD  furosemide (LASIX) 40 MG tablet Take 40 mg by mouth as needed. Fluid retention 03/26/11  Yes Newt Lukes, MD  gabapentin (NEURONTIN) 300 MG capsule Take 1 capsule (300 mg total) by mouth 3 (three) times daily. 01/07/12  Yes Newt Lukes, MD  glimepiride (AMARYL) 2 MG tablet Take 2 mg by mouth daily.   Yes Historical Provider, MD  HUMULIN N 100 UNIT/ML  injection 20 UNITS 2 TIMES A DAY 30 MINUTES BEFORE MEALS (AM & PM) OR AS DIRECTED 05/20/12  Yes Newt Lukes, MD  insulin NPH (HUMULIN N,NOVOLIN N) 100 UNIT/ML injection Inject 20 Units into the skin 2 (two) times daily before a meal.   Yes Historical Provider, MD  Lancets MISC 1 each daily.  08/31/10  Yes Historical Provider, MD  LEVOXYL 200 MCG tablet TAKE 1 TABLET DAILY 03/10/12  Yes Newt Lukes, MD  LEVOXYL 25 MCG tablet TAKE 1 TABLET DAILY WITH TAB 03/10/12  Yes Newt Lukes, MD  LOTRIMIN AF 1 % cream APPLY TOPICALLY 2 TIMES A DAY 12/26/11  Yes Kathlen Brunswick, MD  metoprolol succinate (TOPROL-XL) 25 MG 24 hr tablet Take 1 tablet (25 mg total) by mouth daily. Take with or immediately following a meal. 01/07/12  Yes Kathlen Brunswick, MD  MYCOLOG II cream APPLY TO AFFECTED AREA 3 TIMES A DAY FOR 7 DAYS AS NEEDED ONLY 12/27/11  Yes Newt Lukes, MD  nitrofurantoin (MACRODANTIN) 100 MG capsule Take 1 capsule (100 mg total) by mouth 2 (two) times daily. 04/22/12  Yes Nicki Reaper, NP  nitroGLYCERIN (NITROLINGUAL) 0.4 MG/SPRAY spray Place 1 spray under the tongue every 5 (five) minutes as needed.     Yes Historical Provider, MD  nystatin (MYCOSTATIN) powder Apply 1 Units topically as needed. rash 07/09/11 07/08/12 Yes Newt Lukes, MD  PHENERGAN 25 MG tablet TAKE (1) TABLET EVERY SIX HOURS  AS NEEDED FOR NAUSEA. 12/28/11  Yes Newt Lukes, MD  potassium chloride SA (K-DUR,KLOR-CON) 20 MEQ tablet Take 1 tablet (20 mEq total) by mouth daily. 01/07/12  Yes Kathlen Brunswick, MD  pravastatin (PRAVACHOL) 80 MG tablet Take 1 tablet (80 mg total) by mouth at bedtime. cholesterol 01/07/12  Yes Kathlen Brunswick, MD  PRILOSEC 20 MG capsule TAKE 1 CAPSULE ONCE A DAY FOR STOMACH 03/10/12  Yes Newt Lukes, MD  SYRINGE-NEEDLE, DISP, 3 ML (B-D 3CC LUER-LOK SYR 23GX1-1/4) 23G X 1-1/4" 3 ML MISC by Does not apply route every 30 (thirty) days. To dispense B12 monthly    Yes  Historical Provider, MD  triamcinolone cream (KENALOG) 0.1 % Apply 1 application topically 2 (two) times daily as needed. itch 05/07/11  Yes Newt Lukes, MD  VICODIN 5-500 MG per tablet TAKE 1/2 TO 1 TABLET EVERY 8 HOURS AS NEEDED FOR PAIN 03/12/12  Yes Newt Lukes, MD  vitamin E 100 UNIT capsule Take 100 Units by mouth daily.    Yes Historical Provider, MD  XANAX 0.5 MG tablet TAKE 1 TABLET TWICE A DAY AS NEEDED FOR SLEEP OR ANXIETY 03/10/12  Yes Newt Lukes, MD  solifenacin (VESICARE) 5 MG tablet Take 5 mg by mouth daily. 06/26/11   Newt Lukes, MD  zolpidem (AMBIEN) 5 MG tablet Take 1 tablet (5 mg total) by mouth at bedtime as needed for sleep. 04/02/11 04/01/12  Newt Lukes, MD   Allergies  Allergen Reactions  . Penicillins   Past medical history, social history, and family history reviewed and updated.  ROS: Denies dyspnea, orthopnea, PND, palpitations, lightheadedness or syncope.  She does not monitor blood pressure at home.  All other systems reviewed and are negative.  PHYSICAL EXAM: BP 132/66  Pulse 66  Ht 5\' 2"  (1.575 m)  Wt 115.395 kg (254 lb 6.4 oz)  BMI 46.53 kg/m2  SpO2 98%  General-Well developed; no acute distress Body habitus-Obese Neck-No JVD; no carotid bruits Lungs-clear lung fields; resonant to percussion; decreased breath sounds at the right base; mild kyphosis Cardiovascular-normal PMI; Distant S1 and S2 Abdomen-normal bowel sounds; soft and non-tender without masses or organomegaly Musculoskeletal-No deformities, no cyanosis or clubbing Neurologic-Normal cranial nerves; symmetric strength and tone Skin-Warm, no significant lesions Extremities-distal pulses intact; 2+ ankle edema  EKG: atrial synchronous ventricular pacing with underlying sinus rhythm; left atrial abnormality.  ASSESSMENT AND PLAN:  [place] Bing, MD 05/23/2012 3:11 PM

## 2012-05-23 NOTE — Patient Instructions (Addendum)
Your physician recommends that you schedule a follow-up appointment in: 6 months  A chest x-ray takes a picture of the organs and structures inside the chest, including the heart, lungs, and blood vessels. This test can show several things, including, whether the heart is enlarges; whether fluid is building up in the lungs; and whether pacemaker / defibrillator leads are still in place.  Your physician recommends that you return for lab work in: Today  Your physician has requested that you have a carotid duplex. This test is an ultrasound of the carotid arteries in your neck. It looks at blood flow through these arteries that supply the brain with blood. Allow one hour for this exam. There are no restrictions or special instructions.

## 2012-05-23 NOTE — Assessment & Plan Note (Signed)
No elevation in blood pressure has been documented for at least the past 2 years.  Current medication is effective and will be continued.

## 2012-05-23 NOTE — Assessment & Plan Note (Signed)
CHF appears well compensated at the present time despite the fact that she does not require daily diuretic therapy.  A chest x-ray will be obtained to evaluate minimally abnormal findings over the right posterior lung field.

## 2012-05-23 NOTE — Assessment & Plan Note (Signed)
Lipid profile was excellent one year ago.  Current therapy will be maintained.

## 2012-05-23 NOTE — Progress Notes (Deleted)
Name: [name] [name]    DOB: [birth year]  Age: 78 y.o.  MR#: [id number]       PCP:  Rene Paci, MD      Insurance: @PAYORNAME @   CC:    Chief Complaint  Patient presents with  . Follow-up    Occassionlk Chest pai, patietn stated she did have to use the nitrostat once.   MEDICATION LIST  VS BP 132/66  Pulse 66  Ht 5\' 2"  (1.575 m)  Wt 254 lb 6.4 oz (115.395 kg)  BMI 46.53 kg/m2  SpO2 98%  Weights Current Weight  05/23/12 254 lb 6.4 oz (115.395 kg)  04/22/12 249 lb (112.946 kg)  01/24/12 250 lb (113.399 kg)    Blood Pressure  BP Readings from Last 3 Encounters:  05/23/12 132/66  04/22/12 134/72  01/24/12 130/72     Admit date:  (Not on file) Last encounter with RMR:  04/23/2012   Allergy Allergies  Allergen Reactions  . Penicillins     Current Outpatient Prescriptions  Medication Sig Dispense Refill  . albuterol (PROVENTIL HFA;VENTOLIN HFA) 108 (90 BASE) MCG/ACT inhaler Inhale 2 puffs into the lungs 4 (four) times daily as needed. Shortness of breath      . albuterol-ipratropium (COMBIVENT) 18-103 MCG/ACT inhaler Inhale 2 puffs into the lungs every 6 (six) hours as needed. Shortness of breath      . amLODipine-benazepril (LOTREL) 10-20 MG per capsule Take 1 capsule by mouth daily.  90 capsule  3  . aspirin EC 81 MG tablet Take 81 mg by mouth daily.      . cyanocobalamin (,VITAMIN B-12,) 1000 MCG/ML injection INJECT 1 ML IM ONCE A MONTH  1 mL  11  . furosemide (LASIX) 40 MG tablet Take 40 mg by mouth as needed. Fluid retention      . gabapentin (NEURONTIN) 300 MG capsule Take 1 capsule (300 mg total) by mouth 3 (three) times daily.  90 capsule  1  . glimepiride (AMARYL) 2 MG tablet Take 2 mg by mouth daily.      Marland Kitchen HUMULIN N 100 UNIT/ML injection 20 UNITS 2 TIMES A DAY 30 MINUTES BEFORE MEALS (AM & PM) OR AS DIRECTED  40 mL  3  . insulin NPH (HUMULIN N,NOVOLIN N) 100 UNIT/ML injection Inject 20 Units into the skin 2 (two) times daily before a meal.      . Lancets  MISC 1 each daily.       Marland Kitchen LEVOXYL 200 MCG tablet TAKE 1 TABLET DAILY  90 tablet  2  . LEVOXYL 25 MCG tablet TAKE 1 TABLET DAILY WITH TAB  90 tablet  2  . LOTRIMIN AF 1 % cream APPLY TOPICALLY 2 TIMES A DAY  1 Tube  3  . metoprolol succinate (TOPROL-XL) 25 MG 24 hr tablet Take 1 tablet (25 mg total) by mouth daily. Take with or immediately following a meal.  90 tablet  3  . MYCOLOG II cream APPLY TO AFFECTED AREA 3 TIMES A DAY FOR 7 DAYS AS NEEDED ONLY  60 g  1  . nitrofurantoin (MACRODANTIN) 100 MG capsule Take 1 capsule (100 mg total) by mouth 2 (two) times daily.  14 capsule  0  . nitroGLYCERIN (NITROLINGUAL) 0.4 MG/SPRAY spray Place 1 spray under the tongue every 5 (five) minutes as needed.        . nystatin (MYCOSTATIN) powder Apply 1 Units topically as needed. rash      . PHENERGAN 25 MG tablet TAKE (1)  TABLET EVERY SIX HOURS AS NEEDED FOR NAUSEA.  30 each  0  . potassium chloride SA (K-DUR,KLOR-CON) 20 MEQ tablet Take 1 tablet (20 mEq total) by mouth daily.  90 tablet  3  . pravastatin (PRAVACHOL) 80 MG tablet Take 1 tablet (80 mg total) by mouth at bedtime. cholesterol  90 tablet  3  . PRILOSEC 20 MG capsule TAKE 1 CAPSULE ONCE A DAY FOR STOMACH  90 capsule  2  . SYRINGE-NEEDLE, DISP, 3 ML (B-D 3CC LUER-LOK SYR 23GX1-1/4) 23G X 1-1/4" 3 ML MISC by Does not apply route every 30 (thirty) days. To dispense B12 monthly       . triamcinolone cream (KENALOG) 0.1 % Apply 1 application topically 2 (two) times daily as needed. itch      . VICODIN 5-500 MG per tablet TAKE 1/2 TO 1 TABLET EVERY 8 HOURS AS NEEDED FOR PAIN  120 tablet  1  . vitamin E 100 UNIT capsule Take 100 Units by mouth daily.       Marland Kitchen XANAX 0.5 MG tablet TAKE 1 TABLET TWICE A DAY AS NEEDED FOR SLEEP OR ANXIETY  180 tablet  1  . solifenacin (VESICARE) 5 MG tablet Take 5 mg by mouth daily.      Marland Kitchen zolpidem (AMBIEN) 5 MG tablet Take 1 tablet (5 mg total) by mouth at bedtime as needed for sleep.  90 tablet  1  . [DISCONTINUED]  nystatin (MYCOSTATIN) powder Apply to affected area 3 times daily  60 g  1    Discontinued Meds:    Medications Discontinued During This Encounter  Medication Reason  . phenazopyridine (PYRIDIUM) 200 MG tablet Error    Patient Active Problem List  Diagnosis  . HYPOTHYROIDISM  . VITAMIN B12 DEFICIENCY  . HYPERLIPIDEMIA  . Obesity, unspecified  . ANXIETY  . PERIPHERAL NEUROPATHY  . HYPERTENSION  . Chronic diastolic heart failure  . Arteriosclerotic cardiovascular disease (ASCVD)  . CEREBROVASCULAR DISEASE  . COPD  . OSTEOARTHRITIS, KNEES, BILATERAL  . COLONIC POLYPS, ADENOMATOUS, HX OF  . PPM-Medtronic  . Sinoatrial node dysfunction  . Gastroesophageal reflux disease  . Type II or unspecified type diabetes mellitus with neurological manifestations, uncontrolled  . Insomnia  . Intertrigo  . Fibula upper end fracture    LABS Clinical Support on 05/19/2012  Component Date Value  . DEVICE MODEL PM 05/19/2012 [id number]   . [id number] 05/19/2012 Sherryl Manges C  M.D.   . Sherlon Handing 05/19/2012 Duke Salvia  M.D.   . PACEART TECH NOTES PM 05/19/2012                     Value:Pacemaker check in clinic. Normal device function. Thresholds, sensing, impedances consistent with previous measurements. Device programmed to maximize longevity. No mode switch or high ventricular rates noted. Device programmed at appropriate safety                          margins. Histogram distribution appropriate for patient activity level. Device programmed to optimize intrinsic conduction. Estimated longevity 9.5 years. ROV in 6 mths w/GT in RDS.  Marland Kitchen ATRIAL PACING PM 05/19/2012 62   . VENTRICULAR PACING PM 05/19/2012 100   . BATTERY VOLTAGE 05/19/2012 2.78   . AL IMPEDENCE PM 05/19/2012 448   . RV LEAD IMPEDENCE PM 05/19/2012 617   . AL AMPLITUDE 05/19/2012 4   . AL THRESHOLD 05/19/2012 0.5   . RV LEAD THRESHOLD 05/19/2012 0.5   .  BAMS-0001 05/19/2012 175   . BAMS-0002 05/19/2012 No Delay    Office Visit on 04/22/2012  Component Date Value  . Color, UA 04/22/2012 dark yellow   . Clarity, UA 04/22/2012 clear   . Glucose, UA 04/22/2012 negative   . Bilirubin, UA 04/22/2012 negative   . Ketones, UA 04/22/2012 negative   . Spec Grav, UA 04/22/2012 1.020   . Blood, UA 04/22/2012 negative   . pH, UA 04/22/2012 6.5   . Protein, UA 04/22/2012 negative   . Urobilinogen, UA 04/22/2012 0.2   . Nitrite, UA 04/22/2012 negative   . Leukocytes, UA 04/22/2012 moderate (2+)      Results for this Opt Visit:     Results for orders placed in visit on 05/19/12  PACEMAKER DEVICE OBSERVATION      Component Value Range   DEVICE MODEL PM [id number]     [id number] Duke Salvia  M.D.     [id number] Duke Salvia  M.D.     PACEART TECH NOTES PM       Value: Pacemaker check in clinic. Normal device function. Thresholds, sensing, impedances consistent with previous measurements. Device programmed to maximize longevity. No mode switch or high ventricular rates noted. Device programmed at appropriate safety      margins. Histogram distribution appropriate for patient activity level. Device programmed to optimize intrinsic conduction. Estimated longevity 9.5 years. ROV in 6 mths w/GT in RDS.   ATRIAL PACING PM 62     VENTRICULAR PACING PM 100     BATTERY VOLTAGE 2.78     AL IMPEDENCE PM 448     RV LEAD IMPEDENCE PM 617     AL AMPLITUDE 4     AL THRESHOLD 0.5     RV LEAD THRESHOLD 0.5     BAMS-0001 175     BAMS-0002 No Delay      EKG Orders placed in visit on 05/23/12  . EKG 12-LEAD     Prior Assessment and Plan Problem List as of 05/23/2012            Cardiology Problems   HYPERLIPIDEMIA   Last Assessment & Plan Note   10/22/2011 Office Visit Signed 10/22/2011 11:39 AM by Newt Lukes, MD    Statin increase spring 2012 - doing well Recheck annually    HYPERTENSION   Last Assessment & Plan Note   01/24/2012 Office Visit Signed 01/24/2012 10:47 AM by Newt Lukes,  MD     stable overall by hx and exam combined amlodipine and ACEI fall 2012  BP Readings from Last 3 Encounters:  01/24/12 130/72  01/07/12 120/72  12/24/11 140/60      Chronic diastolic heart failure   Last Assessment & Plan Note   12/03/2011 Office Visit Signed 12/03/2011  4:45 PM by Marinus Maw, MD    Her symptoms are predominantly those of fatigue. I've asked the patient to reduce her dose of beta blocker from 50 mg to 25 mg daily. If this does not improve her symptoms, then we would consider reinitiating diuretic therapy.    Arteriosclerotic cardiovascular disease (ASCVD)   Last Assessment & Plan Note   04/05/2011 Office Visit Signed 04/06/2011  8:24 AM by Kathlen Brunswick, MD    Patient has no symptoms at present attributable to coronary artery disease and excellent control of cardiovascular risk factors.    CEREBROVASCULAR DISEASE   Last Assessment & Plan Note   04/05/2011 Office Visit Addendum 04/06/2011  8:28 AM by Molly Maduro  Rosebud Poles, MD    Carotid ultrasound 7 months ago demonstrated some progression of disease with a mild right internal carotid artery stenosis.  A followup study will be obtained within the year.    Sinoatrial node dysfunction   Last Assessment & Plan Note   04/05/2011 Office Visit Addendum 04/06/2011  8:35 AM by Kathlen Brunswick, MD    Recent generator change.  Future appointment with Dr. Ladona Ridgel will be verified.      Other   HYPOTHYROIDISM   Last Assessment & Plan Note   01/24/2012 Office Visit Signed 01/24/2012 10:47 AM by Newt Lukes, MD     Difficult with medication splitting - but otherwise doing well Check lab today - adjust as needed Lab Results  Component Value Date   TSH 0.55 10/22/2011      VITAMIN B12 DEFICIENCY   Obesity, unspecified   Last Assessment & Plan Note   04/05/2011 Office Visit Signed 04/06/2011  8:34 AM by Kathlen Brunswick, MD    Obesity has been a chronic problem.  At least weight is stable since previous  visit 4 months ago.  Patient has been unable to restrict calories adequately to lose a significant amount of weight.    ANXIETY   PERIPHERAL NEUROPATHY   COPD   OSTEOARTHRITIS, KNEES, BILATERAL   COLONIC POLYPS, ADENOMATOUS, HX OF   PPM-Medtronic   Last Assessment & Plan Note   12/03/2011 Office Visit Signed 12/03/2011  4:44 PM by Marinus Maw, MD    Her device is working normally. There is longevity approximately 9 years. We'll recheck in several months.    Gastroesophageal reflux disease   Type II or unspecified type diabetes mellitus with neurological manifestations, uncontrolled   Last Assessment & Plan Note   01/24/2012 Office Visit Signed 01/24/2012 10:47 AM by Newt Lukes, MD     The current medical regimen is effective;  continue present plan and medications. On ASA, ACEI, statin Lab Results  Component Value Date   HGBA1C 7.3* 10/22/2011      Insomnia   Last Assessment & Plan Note   04/05/2011 Office Visit Signed 04/06/2011  8:38 AM by Kathlen Brunswick, MD    Difficulty falling and staying asleep; History of positive sleep study for obstructive sleep apnea, but patient does not tolerate positive pressure device.  Treatment with benzodiazepines has not been effective and is not desirable in this older woman.  Dr. Felicity Coyer may wish to consider referral to a sleep specialist.    Intertrigo   Fibula upper end fracture       Imaging: No results found.   FRS Calculation: Score not calculated. Missing: Total Cholesterol

## 2012-05-23 NOTE — Assessment & Plan Note (Signed)
It is difficult to determine whether a single episode of chest discomfort represented myocardial ischemia.  She is doing well in general and will not require additional testing at present.  We will continue to attempt to maintain optimal control of cardiovascular risk factors.

## 2012-05-27 ENCOUNTER — Other Ambulatory Visit: Payer: Self-pay | Admitting: Internal Medicine

## 2012-05-27 ENCOUNTER — Ambulatory Visit (HOSPITAL_COMMUNITY)
Admission: RE | Admit: 2012-05-27 | Discharge: 2012-05-27 | Disposition: A | Discharge status: Home / Self Care | Payer: Medicare Other | Source: Ambulatory Visit | Attending: Cardiology | Admitting: Cardiology

## 2012-05-27 DIAGNOSIS — I2510 Atherosclerotic heart disease of native coronary artery without angina pectoris: Secondary | ICD-10-CM

## 2012-05-27 DIAGNOSIS — I5032 Chronic diastolic (congestive) heart failure: Secondary | ICD-10-CM

## 2012-05-27 DIAGNOSIS — I509 Heart failure, unspecified: Secondary | ICD-10-CM | POA: Insufficient documentation

## 2012-05-27 DIAGNOSIS — I10 Essential (primary) hypertension: Secondary | ICD-10-CM | POA: Insufficient documentation

## 2012-05-27 DIAGNOSIS — E119 Type 2 diabetes mellitus without complications: Secondary | ICD-10-CM | POA: Insufficient documentation

## 2012-05-27 DIAGNOSIS — I6529 Occlusion and stenosis of unspecified carotid artery: Secondary | ICD-10-CM | POA: Insufficient documentation

## 2012-05-27 DIAGNOSIS — I679 Cerebrovascular disease, unspecified: Secondary | ICD-10-CM | POA: Insufficient documentation

## 2012-05-27 DIAGNOSIS — E785 Hyperlipidemia, unspecified: Secondary | ICD-10-CM

## 2012-05-28 ENCOUNTER — Encounter: Payer: Self-pay | Admitting: Cardiology

## 2012-05-29 ENCOUNTER — Encounter: Payer: Self-pay | Admitting: Family Medicine

## 2012-05-29 ENCOUNTER — Ambulatory Visit (INDEPENDENT_AMBULATORY_CARE_PROVIDER_SITE_OTHER): Payer: Medicare Other | Admitting: Family Medicine

## 2012-05-29 VITALS — BP 120/76 | HR 61 | Temp 98.6°F | Resp 16

## 2012-05-29 DIAGNOSIS — L304 Erythema intertrigo: Secondary | ICD-10-CM

## 2012-05-29 DIAGNOSIS — N23 Unspecified renal colic: Secondary | ICD-10-CM

## 2012-05-29 DIAGNOSIS — L538 Other specified erythematous conditions: Secondary | ICD-10-CM

## 2012-05-29 DIAGNOSIS — R309 Painful micturition, unspecified: Secondary | ICD-10-CM

## 2012-05-29 DIAGNOSIS — N390 Urinary tract infection, site not specified: Secondary | ICD-10-CM

## 2012-05-29 LAB — POCT URINALYSIS DIPSTICK
Bilirubin, UA: NEGATIVE
Blood, UA: NEGATIVE
Glucose, UA: NEGATIVE
Ketones, UA: NEGATIVE
Nitrite, UA: NEGATIVE
Protein, UA: NEGATIVE
Spec Grav, UA: 1.025
Urobilinogen, UA: 0.2
pH, UA: 5.5

## 2012-05-29 MED ORDER — CEPHALEXIN 500 MG PO CAPS: ORAL_CAPSULE | ORAL | Status: DC

## 2012-05-29 NOTE — Patient Instructions (Signed)
Apply your husband's mycolog II cream: apply it to your rash three times a day until rash is gone.

## 2012-05-29 NOTE — Progress Notes (Signed)
OFFICE NOTE  05/29/2012  CC:  Chief Complaint  Patient presents with  . Rash    lower abdomen, in skin fold;      HPI: Patient is a 78 y.o. African-American female who is here for rash.  Also says, "feels like something is falling out of my bottom.  C/o urinary frequency and worsened nocturia lately, occ dysuria, no fever, nausea but no vomiting.  Feels fullness/pain in pubic/vulvar region.  She has back pain in midline of distal lumbar area.  No hx of problem with hemorrhoids, but she says she is constipated.  Has BM qod, has to strain a lot.  No pain around anal opening.  No vaginal d/c or bleeding.  Pertinent PMH:  Past Medical History  Diagnosis Date  . Arteriosclerotic cardiovascular disease (ASCVD)     Cath in 9/99-70% mid LAD with diffuse distal disease, 70% T1, 60% mid circumflex, 50% mid RCA, normal ejection fraction; negative stress nuclear in 07/2008  . Sinoatrial node dysfunction 2003    Medtronic dual-chamber device  . Cerebrovascular disease     carotid bruits; no focal disease in 1999, 2002 and 2006  . ANXIETY   . Diastolic CHF, chronic     Normal EF  . COPD   . Hyperlipidemia   . Hypertension   . Hypothyroidism   . Gastroesophageal reflux disease     With hiatal hernia; irritable bowel syndrome; H. pylori-treated; Colonoscopy 2008: non-specific colitis, IH, Diverticulosis, Rectal ulcer secondary to ASA  . OSTEOARTHRITIS, KNEES, BILATERAL     Bilateral TKA  . VITAMIN B12 DEFICIENCY   . Carpal tunnel syndrome   . Peripheral neuropathy   . Diabetes mellitus     Insulin treatment  . Pacemaker   . Closed left fibular fracture 11/30/11    MEDS:  Outpatient Prescriptions Prior to Visit  Medication Sig Dispense Refill  . albuterol-ipratropium (COMBIVENT) 18-103 MCG/ACT inhaler Inhale 2 puffs into the lungs every 6 (six) hours as needed. Shortness of breath      . amLODipine-benazepril (LOTREL) 10-20 MG per capsule Take 1 capsule by mouth daily.  90 capsule  3  .  aspirin EC 81 MG tablet Take 81 mg by mouth daily.      . furosemide (LASIX) 40 MG tablet Take 40 mg by mouth as needed. Fluid retention      . gabapentin (NEURONTIN) 300 MG capsule Take 1 capsule (300 mg total) by mouth 3 (three) times daily.  90 capsule  1  . glimepiride (AMARYL) 2 MG tablet TAKE 1 TABLET DAILY IN THE MORNING FOR DIABETES  90 tablet  1  . insulin NPH (HUMULIN N,NOVOLIN N) 100 UNIT/ML injection Inject 20 Units into the skin 2 (two) times daily before a meal.      . Lancets MISC 1 each daily.       Marland Kitchen LEVOXYL 200 MCG tablet TAKE 1 TABLET DAILY  90 tablet  2  . LEVOXYL 25 MCG tablet TAKE 1 TABLET DAILY WITH TAB  90 tablet  2  . LOTRIMIN AF 1 % cream APPLY TOPICALLY 2 TIMES A DAY  1 Tube  3  . metoprolol succinate (TOPROL-XL) 25 MG 24 hr tablet Take 1 tablet (25 mg total) by mouth daily. Take with or immediately following a meal.  90 tablet  3  . Multiple Vitamin (MULTIVITAMIN) tablet Take 1 tablet by mouth daily.      Marland Kitchen MYCOLOG II cream APPLY TO AFFECTED AREA 3 TIMES A DAY FOR 7 DAYS  AS NEEDED ONLY  60 g  1  . nitroGLYCERIN (NITROLINGUAL) 0.4 MG/SPRAY spray Place 1 spray under the tongue every 5 (five) minutes as needed.        . nystatin (MYCOSTATIN) powder Apply 1 Units topically as needed. rash      . potassium chloride SA (K-DUR,KLOR-CON) 20 MEQ tablet Take 20 mEq by mouth as needed. Take only when lasix is needed      . pravastatin (PRAVACHOL) 80 MG tablet Take 1 tablet (80 mg total) by mouth at bedtime. cholesterol  90 tablet  3  . PRILOSEC 20 MG capsule TAKE 1 CAPSULE ONCE A DAY FOR STOMACH  90 capsule  2  . solifenacin (VESICARE) 5 MG tablet Take 5 mg by mouth daily.      . SYRINGE-NEEDLE, DISP, 3 ML (B-D 3CC LUER-LOK SYR 23GX1-1/4) 23G X 1-1/4" 3 ML MISC by Does not apply route every 30 (thirty) days. To dispense B12 monthly       . VICODIN 5-500 MG per tablet TAKE 1/2 TO 1 TABLET EVERY 8 HOURS AS NEEDED FOR PAIN  120 tablet  1  . XANAX 0.5 MG tablet TAKE 1 TABLET  TWICE A DAY AS NEEDED FOR SLEEP OR ANXIETY  180 tablet  1  . [DISCONTINUED] nitrofurantoin (MACRODANTIN) 100 MG capsule Take 1 capsule (100 mg total) by mouth 2 (two) times daily.  14 capsule  0   Last reviewed on 05/29/2012  4:55 PM by Jeoffrey Massed, MD  PE: Blood pressure 120/76, pulse 61, temperature 98.6 F (37 C), temperature source Temporal, resp. rate 16. Gen: Alert, AA female, morbidly obese, NAD.  Patient is oriented to person, place, time, and situation. SKIN: under pt's pannus and extending down into groin creases there is an erythematous, macerated rash c/w intertrigo.  No bullae or vesicles.  No foul odor.     IMPRESSION AND PLAN:  Intertrigo Question even early super-infection with bacteria. Recommended she restart the mycolog II cream. Also started keflex 500mg  tid.   Emphasized the need to keep these opposing skin surfaces dry, etc.  UTI (lower urinary tract infection) Send urine for c/s. Start keflex 500mg  tid.    FOLLOW UP: 1 wk

## 2012-05-30 ENCOUNTER — Telehealth: Payer: Self-pay | Admitting: Cardiology

## 2012-05-30 DIAGNOSIS — I779 Disorder of arteries and arterioles, unspecified: Secondary | ICD-10-CM

## 2012-05-30 NOTE — Telephone Encounter (Signed)
See result note for details.

## 2012-05-30 NOTE — Telephone Encounter (Signed)
PT CALLING FOR TEST RESULTS

## 2012-05-31 ENCOUNTER — Other Ambulatory Visit: Payer: Self-pay | Admitting: Internal Medicine

## 2012-05-31 LAB — URINE CULTURE: Colony Count: 40000

## 2012-06-02 NOTE — Progress Notes (Signed)
Quick Note:  Patient Informed and voiced understanding ______ 

## 2012-06-04 NOTE — Assessment & Plan Note (Signed)
Send urine for c/s. Start keflex 500mg  tid.

## 2012-06-04 NOTE — Assessment & Plan Note (Signed)
Question even early super-infection with bacteria. Recommended she restart the mycolog II cream. Also started keflex 500mg  tid.   Emphasized the need to keep these opposing skin surfaces dry, etc.

## 2012-06-09 ENCOUNTER — Encounter: Payer: Self-pay | Admitting: *Deleted

## 2012-06-20 ENCOUNTER — Ambulatory Visit (HOSPITAL_COMMUNITY)
Admission: RE | Admit: 2012-06-20 | Discharge: 2012-06-20 | Disposition: A | Discharge status: Home / Self Care | Payer: Medicare Other | Source: Ambulatory Visit | Attending: Cardiology | Admitting: Cardiology

## 2012-06-20 DIAGNOSIS — J4489 Other specified chronic obstructive pulmonary disease: Secondary | ICD-10-CM | POA: Insufficient documentation

## 2012-06-20 DIAGNOSIS — R0602 Shortness of breath: Secondary | ICD-10-CM | POA: Insufficient documentation

## 2012-06-20 DIAGNOSIS — R509 Fever, unspecified: Secondary | ICD-10-CM | POA: Insufficient documentation

## 2012-06-20 DIAGNOSIS — J449 Chronic obstructive pulmonary disease, unspecified: Secondary | ICD-10-CM | POA: Insufficient documentation

## 2012-06-20 DIAGNOSIS — R079 Chest pain, unspecified: Secondary | ICD-10-CM | POA: Insufficient documentation

## 2012-06-20 LAB — COMPREHENSIVE METABOLIC PANEL
ALT: 21 U/L (ref 0–35)
AST: 23 U/L (ref 0–37)
Albumin: 4.2 g/dL (ref 3.5–5.2)
Alkaline Phosphatase: 81 U/L (ref 39–117)
BUN: 16 mg/dL (ref 6–23)
CO2: 28 mEq/L (ref 19–32)
Calcium: 10.2 mg/dL (ref 8.4–10.5)
Chloride: 103 mEq/L (ref 96–112)
Creat: 0.95 mg/dL (ref 0.50–1.10)
Glucose, Bld: 146 mg/dL — ABNORMAL HIGH (ref 70–99)
Potassium: 4.8 mEq/L (ref 3.5–5.3)
Sodium: 135 mEq/L (ref 135–145)
Total Bilirubin: 0.7 mg/dL (ref 0.3–1.2)
Total Protein: 7 g/dL (ref 6.0–8.3)

## 2012-06-20 LAB — CBC
HCT: 41.6 % (ref 36.0–46.0)
Hemoglobin: 14.2 g/dL (ref 12.0–15.0)
MCH: 30 pg (ref 26.0–34.0)
MCHC: 34.1 g/dL (ref 30.0–36.0)
MCV: 87.8 fL (ref 78.0–100.0)
RBC: 4.74 MIL/uL (ref 3.87–5.11)

## 2012-06-22 ENCOUNTER — Encounter: Payer: Self-pay | Admitting: Cardiology

## 2012-06-23 ENCOUNTER — Encounter: Payer: Self-pay | Admitting: *Deleted

## 2012-06-24 ENCOUNTER — Telehealth: Payer: Self-pay | Admitting: Cardiology

## 2012-06-24 NOTE — Telephone Encounter (Signed)
PT IS CALLING FOR TEST RESULTS FROM LAST WEEK

## 2012-06-24 NOTE — Telephone Encounter (Signed)
Result letter has been sent, however patient called back with normal results.

## 2012-07-01 ENCOUNTER — Ambulatory Visit (INDEPENDENT_AMBULATORY_CARE_PROVIDER_SITE_OTHER): Payer: Medicare Other | Admitting: Cardiovascular Disease

## 2012-07-01 ENCOUNTER — Encounter: Payer: Self-pay | Admitting: Cardiovascular Disease

## 2012-07-01 VITALS — BP 152/69 | HR 72 | Ht 70.0 in | Wt 255.4 lb

## 2012-07-01 DIAGNOSIS — I6529 Occlusion and stenosis of unspecified carotid artery: Secondary | ICD-10-CM

## 2012-07-01 NOTE — Patient Instructions (Addendum)
Your physician has requested that you have a carotid duplex in 6 MONTHS. This test is an ultrasound of the carotid arteries in your neck. It looks at blood flow through these arteries that supply the brain with blood. Allow one hour for this exam. There are no restrictions or special instructions.  Your physician wants you to follow-up in: 6 MONTHS with Dr Excell Seltzer.  You will receive a reminder letter in the mail two months in advance. If you don't receive a letter, please call our office to schedule the follow-up appointment.  Your physician recommends that you continue on your current medications as directed. Please refer to the Current Medication list given to you today.

## 2012-07-01 NOTE — Progress Notes (Signed)
HPI:  79 year old woman referred for evaluation of carotid arterial disease. The patient has no history of stroke or TIA. She recently underwent a carotid duplex scan for followup of known disease. This demonstrated greater than 70% bilateral ICA stenosis with disease extending from the carotid bulb into the internal carotid arteries bilaterally. End diastolic velocities are less than 100 bilaterally. The patient specifically denies symptoms of arm clumsiness, expressive aphasia, or amaurosis fugax. She does have some numbness in her legs but no unilateral symptoms. She does complain of constant dizziness and a "racket" in her head. She describes this as a constant "buzzing."  She reports more frequent chest discomfort. She complains of a dull pain in the left breast and she has been taking nitroglycerin about every other day with relief of her discomfort. There is no exertional component. She denies dyspnea, edema, orthopnea, or PND.  Outpatient Encounter Prescriptions as of 07/01/2012  Medication Sig Dispense Refill  . albuterol-ipratropium (COMBIVENT) 18-103 MCG/ACT inhaler Inhale 2 puffs into the lungs every 6 (six) hours as needed. Shortness of breath      . amLODipine-benazepril (LOTREL) 10-20 MG per capsule Take 1 capsule by mouth daily.  90 capsule  3  . aspirin EC 81 MG tablet Take 81 mg by mouth daily.      . furosemide (LASIX) 40 MG tablet Take 40 mg by mouth as needed. Fluid retention      . gabapentin (NEURONTIN) 300 MG capsule Take 1 capsule (300 mg total) by mouth 3 (three) times daily.  90 capsule  1  . glimepiride (AMARYL) 2 MG tablet TAKE 1 TABLET DAILY IN THE MORNING FOR DIABETES  90 tablet  1  . insulin NPH (HUMULIN N,NOVOLIN N) 100 UNIT/ML injection Inject 20 Units into the skin 2 (two) times daily before a meal.      . Lancets MISC 1 each daily.       Marland Kitchen LEVOXYL 200 MCG tablet TAKE 1 TABLET DAILY  90 tablet  2  . LEVOXYL 25 MCG tablet TAKE 1 TABLET DAILY WITH TAB  90  tablet  2  . LOTRIMIN AF 1 % cream APPLY TOPICALLY 2 TIMES A DAY  1 Tube  3  . metoprolol succinate (TOPROL-XL) 25 MG 24 hr tablet Take 1 tablet (25 mg total) by mouth daily. Take with or immediately following a meal.  90 tablet  3  . Multiple Vitamin (MULTIVITAMIN) tablet Take 1 tablet by mouth daily.      Marland Kitchen MYCOLOG II cream APPLY TO AFFECTED AREA 3 TIMES A DAY FOR 7 DAYS AS NEEDED ONLY  60 g  1  . nitroGLYCERIN (NITROLINGUAL) 0.4 MG/SPRAY spray Place 1 spray under the tongue every 5 (five) minutes as needed.        . nystatin (MYCOSTATIN) powder Apply 1 Units topically as needed. rash      . potassium chloride SA (K-DUR,KLOR-CON) 20 MEQ tablet Take 20 mEq by mouth as needed. Take only when lasix is needed      . pravastatin (PRAVACHOL) 80 MG tablet Take 1 tablet (80 mg total) by mouth at bedtime. cholesterol  90 tablet  3  . PRILOSEC 20 MG capsule TAKE 1 CAPSULE ONCE A DAY FOR STOMACH  90 capsule  2  . solifenacin (VESICARE) 5 MG tablet Take 5 mg by mouth daily.      . SYRINGE-NEEDLE, DISP, 3 ML (B-D 3CC LUER-LOK SYR 23GX1-1/4) 23G X 1-1/4" 3 ML MISC by Does not apply route every  30 (thirty) days. To dispense B12 monthly       . VICODIN 5-500 MG per tablet TAKE 1/2 TO 1 TABLET EVERY 8 HOURS AS NEEDED FOR PAIN  120 tablet  1  . XANAX 0.5 MG tablet TAKE 1 TABLET TWICE A DAY AS NEEDED FOR SLEEP OR ANXIETY  180 tablet  1  . [DISCONTINUED] cephALEXin (KEFLEX) 500 MG capsule 1 cap po tid x 10d  30 capsule  0  . [DISCONTINUED] PROAIR HFA 108 (90 BASE) MCG/ACT inhaler INHALE 2 PUFFS 4 TIMES A DAY AS DIRECTED  8.5 g  2    Penicillins  Past Medical History  Diagnosis Date  . Arteriosclerotic cardiovascular disease (ASCVD)     Cath in 9/99-70% mid LAD with diffuse distal disease, 70% T1, 60% mid circumflex, 50% mid RCA, normal ejection fraction; negative stress nuclear in 07/2008  . Sinoatrial node dysfunction 2003    Medtronic dual-chamber device  . Cerebrovascular disease     carotid bruits; no  focal disease in 1999, 2002 and 2006  . ANXIETY   . Diastolic CHF, chronic     Normal EF  . COPD   . Hyperlipidemia   . Hypertension   . Hypothyroidism   . Gastroesophageal reflux disease     With hiatal hernia; irritable bowel syndrome; H. pylori-treated; Colonoscopy 2008: non-specific colitis, IH, Diverticulosis, Rectal ulcer secondary to ASA  . OSTEOARTHRITIS, KNEES, BILATERAL     Bilateral TKA  . VITAMIN B12 DEFICIENCY   . Carpal tunnel syndrome   . Peripheral neuropathy   . Diabetes mellitus     Insulin treatment  . Pacemaker   . Closed left fibular fracture 11/30/11    Past Surgical History  Procedure Date  . Total knee arthroplasty     Bilateral  . Nasal fracture surgery   . Cholecystectomy   . Abdominal hysterectomy   . Hemorrhoid surgery   . Carpal tunnel release   . Thyroidectomy 1980    Goiter  . Bladder repair   . Cardiac pacemaker placement 2003    Medtronic, dual-chamber  . Colonoscopy 2008    History   Social History  . Marital Status: Married    Spouse Name: N/A    Number of Children: N/A  . Years of Education: 8   Occupational History  .     Social History Main Topics  . Smoking status: Never Smoker   . Smokeless tobacco: Not on file     Comment: Retired- lives with spouse (who has dementia). Pt has 4 children  . Alcohol Use: No  . Drug Use: No  . Sexually Active: Not Currently   Other Topics Concern  . Not on file   Social History Narrative  . No narrative on file    Family History  Problem Relation Age of Onset  . COPD Father   . Heart failure Mother   . Cancer Sister     colon  . Cancer Brother     lung   ROS:  General: no fevers/chills/night sweats Eyes: no blurry vision, diplopia, or amaurosis ENT: no sore throat or hearing loss Resp: no cough, wheezing, or hemoptysis CV: no edema or palpitations GI: no abdominal pain, nausea, vomiting, diarrhea, or constipation GU: no dysuria, frequency, or hematuria Skin: no  rash Neuro: no headache, numbness, tingling, or weakness of extremities Musculoskeletal: positive for hip and knee pain bilaterally Heme: no bleeding, DVT, or easy bruising Endo: no polydipsia or polyuria  BP 152/69  Pulse 72  Ht 5\' 10"  (1.778 m)  Wt 115.849 kg (255 lb 6.4 oz)  BMI 36.65 kg/m2  PHYSICAL EXAM: Pt is alert and oriented, WD, WN, pleasant obese woman in  no distress. HEENT: normal Neck: JVP normal. Carotid upstrokes normal with bilateral bruits. No thyromegaly. Lungs: equal expansion, clear bilaterally CV: Apex is discrete and nondisplaced, RRR without murmur or gallop Abd: soft, NT, +BS, no bruit, obese Back: no CVA tenderness Ext: no C/C/E Skin: warm and dry without rash Neuro: CNII-XII intact             Strength intact = bilaterally   Carotid duplex:  Study Result     *RADIOLOGY REPORT*  Clinical Data: Cerebrovascular disease, CHF, hypertension, diabetes  BILATERAL CAROTID DUPLEX ULTRASOUND  Technique: Wallace Cullens scale imaging, color Doppler and duplex ultrasound  was performed of bilateral carotid and vertebral arteries in the  neck.  Comparison: None.  Criteria: Quantification of carotid stenosis is based on velocity  parameters that correlate the residual internal carotid diameter  with NASCET-based stenosis levels, using the diameter of the distal  internal carotid lumen as the denominator for stenosis measurement.  The following velocity measurements were obtained:  PEAK SYSTOLIC/END DIASTOLIC  RIGHT  ICA: 252/46cm/sec  CCA: 84/12cm/sec  SYSTOLIC ICA/CCA RATIO: 3.00  DIASTOLIC ICA/CCA RATIO: 2.11  ECA: 184cm/sec  LEFT  ICA: 266/63cm/sec  CCA: 108/19cm/sec  SYSTOLIC ICA/CCA RATIO: 2.47  DIASTOLIC ICA/CCA RATIO: 3.27  ECA: 98cm/sec  Findings:  RIGHT CAROTID ARTERY: Intimal thickening right CCA. Scattered  plaque right CCA and right carotid bulb extending into proximal  right ECA and ICA. Portions of the plaque are calcified and  shadowing.  Associated turbulent blood flow on color Doppler  imaging at the proximal right ICA with spectral broadening on  waveform analysis. High velocity jet noted proximal to mid right  ICA. Minimal ICA tortuosity.  RIGHT VERTEBRAL ARTERY: Patent, antegrade  LEFT CAROTID ARTERY: Tortuous left ICA. Intimal thickening left  CCA with scattered CCA plaque. Additional large calcified and  partially shadowing plaques identified at left carotid bulb into  proximal left ICA. Turbulent blood flow proximal left ICA on color  Doppler imaging. Spectral broadening left ICA on waveform  analysis. High velocity jet proximal left ICA.  LEFT VERTEBRAL ARTERY: Patent, antegrade  IMPRESSION:  Plaque formation bilaterally in the carotid systems most severe at  the carotid bulbs extending into the internal carotid arteries  bilaterally.  Obtained velocities correspond to greater than 70% but less than  near-occlusion stenoses of the internal carotid arteries  bilaterally.  Original Report Authenticated By: Ulyses Southward, M.D.   ASSESSMENT AND PLAN: 1. Bilateral carotid stenosis - asymptomatic. I have reviewed the patient's duplex data. While doppler criteria suggest greater than 70% stenosis, her diastolic velocities are well below 100 cm/s which is the general threshold for revascularization. I would favor repeat duplex studies in 6 months for surveillance and continued medical management for now. She was counseled about stroke/TIA symptoms and understands to call 911 if these occur.  2. Chest pain with typical and atypical features. She is having more frequent symptoms of chest pain, responsive to sublingual NTG. Consider risk stratification with nuclear stress testing versus cardiac cath. She has baseline paced rhythm making EKG nondiagnostic. Will defer to Dr Dietrich Pates who follows her closely.  I appreciate the opportunity to see this nice patient. I will see her back in 6 months when she returns for her carotid  doppler study.  Tonny Bollman 07/17/2012

## 2012-07-15 ENCOUNTER — Telehealth: Payer: Self-pay | Admitting: *Deleted

## 2012-07-15 MED ORDER — HYDROCODONE-ACETAMINOPHEN 5-325 MG PO TABS: 1.0000 | ORAL_TABLET | Freq: Three times a day (TID) | ORAL | Status: DC | PRN

## 2012-07-15 NOTE — Telephone Encounter (Signed)
Received fax vicodin 5/500 no longer available. Requesting strength to be change to vicodin 5/325. MD out office. Is this ok...lmb

## 2012-07-15 NOTE — Telephone Encounter (Signed)
Faxed script back to [facility]...Raechel Chute

## 2012-07-15 NOTE — Telephone Encounter (Signed)
Ok to change [address]

## 2012-07-16 ENCOUNTER — Other Ambulatory Visit: Payer: Self-pay | Admitting: *Deleted

## 2012-07-16 MED ORDER — HYDROCODONE-ACETAMINOPHEN 5-325 MG PO TABS: 1.0000 | ORAL_TABLET | Freq: Three times a day (TID) | ORAL | Status: DC | PRN

## 2012-07-16 NOTE — Telephone Encounter (Signed)
Left msg on vm vicodin that was sent in yesterday was only for 30 days. Insurance pay for 90 day at a time. Pt requesting 90 rx on her vicodin...Raechel Chute

## 2012-07-17 ENCOUNTER — Encounter: Payer: Self-pay | Admitting: Cardiovascular Disease

## 2012-07-17 MED ORDER — HYDROCODONE-ACETAMINOPHEN 5-325 MG PO TABS: 1.0000 | ORAL_TABLET | Freq: Three times a day (TID) | ORAL | Status: DC | PRN

## 2012-07-17 NOTE — Telephone Encounter (Signed)
Called pt no answer LMOM rx fax to [facility] pharmacy...[name] [name]

## 2012-07-17 NOTE — Telephone Encounter (Signed)
Pt is requesting a 90 day supply due to her insurance...[name] [name]

## 2012-07-17 NOTE — Telephone Encounter (Signed)
ok 

## 2012-07-17 NOTE — Addendum Note (Signed)
Addended by: Rene Paci A on: 07/17/2012 08:27 AM   Modules accepted: Orders

## 2012-07-28 ENCOUNTER — Encounter: Payer: Self-pay | Admitting: Internal Medicine

## 2012-07-28 ENCOUNTER — Other Ambulatory Visit: Payer: Self-pay | Admitting: Internal Medicine

## 2012-07-28 ENCOUNTER — Ambulatory Visit: Payer: Medicare Other | Admitting: Internal Medicine

## 2012-07-30 ENCOUNTER — Other Ambulatory Visit (INDEPENDENT_AMBULATORY_CARE_PROVIDER_SITE_OTHER): Payer: Medicare Other

## 2012-07-30 ENCOUNTER — Encounter: Payer: Self-pay | Admitting: Internal Medicine

## 2012-07-30 ENCOUNTER — Ambulatory Visit (INDEPENDENT_AMBULATORY_CARE_PROVIDER_SITE_OTHER): Payer: Medicare Other | Admitting: Internal Medicine

## 2012-07-30 VITALS — BP 132/70 | HR 75 | Temp 97.0°F | Wt 260.0 lb

## 2012-07-30 DIAGNOSIS — I10 Essential (primary) hypertension: Secondary | ICD-10-CM

## 2012-07-30 DIAGNOSIS — E785 Hyperlipidemia, unspecified: Secondary | ICD-10-CM

## 2012-07-30 DIAGNOSIS — E1149 Type 2 diabetes mellitus with other diabetic neurological complication: Secondary | ICD-10-CM

## 2012-07-30 DIAGNOSIS — E039 Hypothyroidism, unspecified: Secondary | ICD-10-CM

## 2012-07-30 LAB — HEMOGLOBIN A1C: Hgb A1c MFr Bld: 7.4 % — ABNORMAL HIGH (ref 4.6–6.5)

## 2012-07-30 MED ORDER — INSULIN NPH (HUMAN) (ISOPHANE) 100 UNIT/ML ~~LOC~~ SUSP: 22.0000 [IU] | Freq: Two times a day (BID) | SUBCUTANEOUS | Status: DC

## 2012-07-30 NOTE — Patient Instructions (Signed)
It was good to see you today. Medications reviewed, increase Insulin NPH to 22 units 2x/day -no other changes at this time. Test(s) ordered today. Your results will be released to MyChart (or called to you) after review, usually within 72hours after test completion. If any changes need to be made, you will be notified at that same time. Please schedule followup in 6 months for diabetes check, call sooner if problems.

## 2012-07-30 NOTE — Progress Notes (Signed)
Subjective:    Patient ID: [name] [name], female    DOB: [birth year], 79 y.o.   MRN: [id number]  HPI  Here for follow up -reviewed chronic medical issues:  HTN - reports compliance with ongoing medical treatment; recent changes in medication reviewed - prev off amlodipine in lotrel due to edema - then resumed lotrel due to intolerance of dilt - the patient reports compliance with medication(s) as prescribed. Denies adverse side effects.  hypothyroid - reports compliance with ongoing medical treatment; no changes in medication dose or frequency. denies adverse side effects related to current therapy.  DM2 - on insulin and oral meds - reports compliance with ongoing medical treatment; interval changes in medication reviewed - off actos due to fear side effects, on generic amaryl and insulin. denies adverse side effects related to current therapy. no hypoglycemia symptoms or events - checks cbg 2x/d before meals -   dyslipidemia - dose statin increased by cards 07/10/10 - improved FLP in 08/2010 at recheck - the patient reports compliance with medication(s) as prescribed. Denies adverse side effects.   Past Medical History  Diagnosis Date  . Arteriosclerotic cardiovascular disease (ASCVD)     Cath in 9/99-70% mid LAD with diffuse distal disease, 70% T1, 60% mid circumflex, 50% mid RCA, normal ejection fraction; negative stress nuclear in 07/2008  . Sinoatrial node dysfunction 2003    Medtronic dual-chamber device  . Cerebrovascular disease     carotid bruits; no focal disease in 1999, 2002 and 2006  . ANXIETY   . Diastolic CHF, chronic     Normal EF  . COPD   . Hyperlipidemia   . Hypertension   . Hypothyroidism   . Gastroesophageal reflux disease     With hiatal hernia; irritable bowel syndrome; H. pylori-treated; Colonoscopy 2008: non-specific colitis, IH, Diverticulosis, Rectal ulcer secondary to ASA  . OSTEOARTHRITIS, KNEES, BILATERAL     Bilateral TKA  . VITAMIN B12 DEFICIENCY   .  Carpal tunnel syndrome   . Peripheral neuropathy   . Diabetes mellitus     Insulin treatment  . Pacemaker   . Closed left fibular fracture 11/30/11    Review of Systems  Cardiovascular: Negative for chest pain.  Gastrointestinal: Positive for constipation. Negative for vomiting and abdominal pain.  Genitourinary: Negative for dysuria, frequency, flank pain, difficulty urinating and pelvic pain.  Neurological: Negative for weakness and headaches.       Objective:   Physical Exam  BP 132/70  Pulse 75  Temp(Src) 97 F (36.1 C) (Oral)  Wt 260 lb (117.935 kg)  BMI 37.31 kg/m2  SpO2 96% Wt Readings from Last 3 Encounters:  07/30/12 260 lb (117.935 kg)  07/01/12 255 lb 6.4 oz (115.849 kg)  05/23/12 254 lb 6.4 oz (115.395 kg)   Constitutional: She is obese; appears well-developed and well-nourished. No distress. spouse at side Neck: Normal range of motion. Neck supple. No JVD present. No thyromegaly present.  Cardiovascular: Normal rate, regular rhythm and normal heart sounds.  No murmur heard. No BLE edema. Pulmonary/Chest: Effort normal and breath sounds normal. No respiratory distress. She has no wheezes.  Psychiatric: She has a normal mood and affect. Her behavior is normal. Judgment and thought content normal.      Lab Results  Component Value Date   WBC 9.1 06/20/2012   HGB 14.2 06/20/2012   HCT 41.6 06/20/2012   PLT 261 06/20/2012   CHOL 162 10/22/2011   TRIG 159.0* 10/22/2011   HDL 48.80 10/22/2011   ALT  21 06/20/2012   AST 23 06/20/2012   NA 135 06/20/2012   K 4.8 06/20/2012   CL 103 06/20/2012   CREATININE 0.95 06/20/2012   BUN 16 06/20/2012   CO2 28 06/20/2012   TSH 0.69 01/24/2012   INR 1.07 12/04/2010   HGBA1C 6.8* 01/24/2012   MICROALBUR 0.8 07/23/2011    Assessment & Plan:  See problem list. Medications and labs reviewed today.

## 2012-07-30 NOTE — Assessment & Plan Note (Signed)
stable overall by hx and exam combined amlodipine and ACEI fall 2012  BP Readings from Last 3 Encounters:  07/30/12 132/70  07/01/12 152/69  05/29/12 120/76

## 2012-07-30 NOTE — Assessment & Plan Note (Signed)
The current medical regimen is effective;  continue present plan and medications. On ASA, ACEI, statin Lab Results  Component Value Date   HGBA1C 6.8* 01/24/2012

## 2012-07-30 NOTE — Assessment & Plan Note (Signed)
Statin increase spring 2012 - doing well Recheck annually

## 2012-07-30 NOTE — Assessment & Plan Note (Signed)
Difficult with medication splitting - but otherwise doing well Check lab today - adjust as needed Lab Results  Component Value Date   TSH 0.69 01/24/2012

## 2012-08-08 ENCOUNTER — Ambulatory Visit (INDEPENDENT_AMBULATORY_CARE_PROVIDER_SITE_OTHER): Payer: Medicare Other | Admitting: Cardiology

## 2012-08-08 ENCOUNTER — Encounter: Payer: Self-pay | Admitting: Cardiology

## 2012-08-08 ENCOUNTER — Encounter: Payer: Self-pay | Admitting: *Deleted

## 2012-08-08 VITALS — BP 142/80 | HR 84 | Ht 70.0 in | Wt 258.1 lb

## 2012-08-08 DIAGNOSIS — I7090 Unspecified atherosclerosis: Secondary | ICD-10-CM

## 2012-08-08 DIAGNOSIS — E039 Hypothyroidism, unspecified: Secondary | ICD-10-CM

## 2012-08-08 DIAGNOSIS — I2510 Atherosclerotic heart disease of native coronary artery without angina pectoris: Secondary | ICD-10-CM

## 2012-08-08 NOTE — Patient Instructions (Addendum)
Your physician recommends that you schedule a follow-up appointment in: as previously scheduled  Lexiscan Myoview

## 2012-08-08 NOTE — Progress Notes (Deleted)
Name: [name] [name]    DOB: [birth year]  Age: 79 y.o.  MR#: [id number]       PCP:  Rene Paci, MD      Insurance: Payor: Cleatrice Burke MEDICARE  Plan: AARP MEDICARE COMPLETE  Product Type: *No Product type*    CC:   No chief complaint on file.  CUS REVIEWED BY DR Excell Seltzer AND REPEAT IN 6 MONTHS   VS Filed Vitals:   08/08/12 1528  BP: 142/80  Pulse: 84  Height: 5\' 10"  (1.778 m)  Weight: 258 lb 1.3 oz (117.064 kg)  SpO2: 94%    Weights Current Weight  08/08/12 258 lb 1.3 oz (117.064 kg)  07/30/12 260 lb (117.935 kg)  07/01/12 255 lb 6.4 oz (115.849 kg)    Blood Pressure  BP Readings from Last 3 Encounters:  08/08/12 142/80  07/30/12 132/70  07/01/12 152/69     Admit date:  (Not on file) Last encounter with RMR:  06/24/2012   Allergy Penicillins  Current Outpatient Prescriptions  Medication Sig Dispense Refill  . albuterol-ipratropium (COMBIVENT) 18-103 MCG/ACT inhaler Inhale 2 puffs into the lungs every 6 (six) hours as needed. Shortness of breath      . amLODipine-benazepril (LOTREL) 10-20 MG per capsule Take 1 capsule by mouth daily.  90 capsule  3  . aspirin EC 81 MG tablet Take 81 mg by mouth daily.      . furosemide (LASIX) 40 MG tablet Take 40 mg by mouth as needed. Fluid retention      . gabapentin (NEURONTIN) 300 MG capsule Take 1 capsule (300 mg total) by mouth 3 (three) times daily.  90 capsule  1  . glimepiride (AMARYL) 2 MG tablet TAKE 1 TABLET DAILY IN THE MORNING FOR DIABETES  90 tablet  1  . HYDROcodone-acetaminophen (NORCO/VICODIN) 5-325 MG per tablet Take 1 tablet by mouth every 8 (eight) hours as needed.  90 tablet  1  . insulin NPH (HUMULIN N,NOVOLIN N) 100 UNIT/ML injection Inject 22 Units into the skin 2 (two) times daily before a meal.  3 vial  5  . Lancets MISC 1 each daily.       Marland Kitchen LEVOXYL 200 MCG tablet TAKE 1 TABLET DAILY  90 tablet  2  . LEVOXYL 25 MCG tablet TAKE 1 TABLET DAILY WITH TAB  90 tablet  2  . LOTRIMIN AF 1 % cream APPLY  TOPICALLY 2 TIMES A DAY  1 Tube  3  . metoprolol succinate (TOPROL-XL) 25 MG 24 hr tablet Take 1 tablet (25 mg total) by mouth daily. Take with or immediately following a meal.  90 tablet  3  . Multiple Vitamin (MULTIVITAMIN) tablet Take 1 tablet by mouth daily.      . nitroGLYCERIN (NITROLINGUAL) 0.4 MG/SPRAY spray Place 1 spray under the tongue every 5 (five) minutes as needed.        . nystatin-triamcinolone (MYCOLOG II) cream APPLY TO AFFECTED AREA 3 TIMES A DAY FOR 7 DAYS AS NEEDED ONLY  60 g  0  . potassium chloride SA (K-DUR,KLOR-CON) 20 MEQ tablet Take 20 mEq by mouth as needed. Take only when lasix is needed      . pravastatin (PRAVACHOL) 80 MG tablet Take 1 tablet (80 mg total) by mouth at bedtime. cholesterol  90 tablet  3  . PRILOSEC 20 MG capsule TAKE 1 CAPSULE ONCE A DAY FOR STOMACH  90 capsule  2  . solifenacin (VESICARE) 5 MG tablet Take 5 mg by mouth  daily.      Marland Kitchen XANAX 0.5 MG tablet TAKE 1 TABLET TWICE A DAY AS NEEDED FOR SLEEP OR ANXIETY  180 tablet  1   No current facility-administered medications for this visit.    Discontinued Meds:    Medications Discontinued During This Encounter  Medication Reason  . nystatin (MYCOSTATIN) powder Error  . SYRINGE-NEEDLE, DISP, 3 ML (B-D 3CC LUER-LOK SYR 23GX1-1/4) 23G X 1-1/4" 3 ML MISC Error    Patient Active Problem List  Diagnosis  . HYPOTHYROIDISM  . VITAMIN B12 DEFICIENCY  . HYPERLIPIDEMIA  . Obesity, unspecified  . ANXIETY  . PERIPHERAL NEUROPATHY  . Hypertension  . Chronic diastolic heart failure  . Arteriosclerotic cardiovascular disease (ASCVD)  . CEREBROVASCULAR DISEASE  . COPD  . OSTEOARTHRITIS, KNEES, BILATERAL  . COLONIC POLYPS, ADENOMATOUS, HX OF  . PPM-Medtronic  . Sinoatrial node dysfunction  . Gastroesophageal reflux disease  . Type II or unspecified type diabetes mellitus with neurological manifestations, uncontrolled(250.62)  . Insomnia  . Intertrigo  . UTI (lower urinary tract infection)     LABS    Component Value Date/Time   NA 135 06/20/2012 0830   NA 138 12/04/2010 0825   NA 138 07/03/2010 2103   K 4.8 06/20/2012 0830   K 5.1 12/04/2010 0825   K 4.2 07/03/2010 2103   CL 103 06/20/2012 0830   CL 102 12/04/2010 0825   CL 99 07/03/2010 2103   CO2 28 06/20/2012 0830   CO2 26 12/04/2010 0825   CO2 29 07/03/2010 2103   GLUCOSE 146* 06/20/2012 0830   GLUCOSE 111* 12/04/2010 0825   GLUCOSE 174* 07/03/2010 2103   BUN 16 06/20/2012 0830   BUN 12 12/04/2010 0825   BUN 12 07/03/2010 2103   CREATININE 0.95 06/20/2012 0830   CREATININE 1.05 12/04/2010 0825   CREATININE 0.80 07/03/2010 2103   CREATININE 0.9 03/17/2010 1107   CREATININE 0.92 12/25/2009 2356   CALCIUM 10.2 06/20/2012 0830   CALCIUM 9.8 12/04/2010 0825   CALCIUM 9.1 07/03/2010 2103   GFRNONAA 60* 12/25/2009 2356   GFRNONAA 55* 12/18/2009 0820   GFRNONAA >60 12/11/2009 0535   GFRAA  Value: >60        The eGFR has been calculated using the MDRD equation. This calculation has not been validated in all clinical situations. eGFR's persistently <60 mL/min signify possible Chronic Kidney Disease. 12/25/2009 2356   GFRAA  Value: >60        The eGFR has been calculated using the MDRD equation. This calculation has not been validated in all clinical situations. eGFR's persistently <60 mL/min signify possible Chronic Kidney Disease. 12/18/2009 0820   GFRAA  Value: >60        The eGFR has been calculated using the MDRD equation. This calculation has not been validated in all clinical situations. eGFR's persistently <60 mL/min signify possible Chronic Kidney Disease. 12/11/2009 0535   CMP     Component Value Date/Time   NA 135 06/20/2012 0830   K 4.8 06/20/2012 0830   CL 103 06/20/2012 0830   CO2 28 06/20/2012 0830   GLUCOSE 146* 06/20/2012 0830   BUN 16 06/20/2012 0830   CREATININE 0.95 06/20/2012 0830   CREATININE 0.80 07/03/2010 2103   CALCIUM 10.2 06/20/2012 0830   PROT 7.0 06/20/2012 0830   ALBUMIN 4.2 06/20/2012 0830   AST 23 06/20/2012 0830    ALT 21 06/20/2012 0830   ALKPHOS 81 06/20/2012 0830   BILITOT 0.7 06/20/2012 0830   GFRNONAA 60*  12/25/2009 2356   GFRAA  Value: >60        The eGFR has been calculated using the MDRD equation. This calculation has not been validated in all clinical situations. eGFR's persistently <60 mL/min signify possible Chronic Kidney Disease. 12/25/2009 2356       Component Value Date/Time   WBC 9.1 06/20/2012 0830   WBC 6.5 12/04/2010 0825   WBC 7.0 12/30/2009 1148   HGB 14.2 06/20/2012 0830   HGB 14.1 12/04/2010 0825   HGB 13.7 12/30/2009 1148   HCT 41.6 06/20/2012 0830   HCT 43.1 12/04/2010 0825   HCT 39.7 12/30/2009 1148   MCV 87.8 06/20/2012 0830   MCV 87.1 12/04/2010 0825   MCV 91.3 12/30/2009 1148    Lipid Panel     Component Value Date/Time   CHOL 162 10/22/2011 1151   TRIG 159.0* 10/22/2011 1151   HDL 48.80 10/22/2011 1151   CHOLHDL 3 10/22/2011 1151   VLDL 31.8 10/22/2011 1151   LDLCALC 81 10/22/2011 1151    ABG No results found for this basename: phart, pco2, pco2art, po2, po2art, hco3, tco2, acidbasedef, o2sat     Lab Results  Component Value Date   TSH 0.69 01/24/2012   BNP (last 3 results) No results found for this basename: PROBNP,  in the last 8760 hours Cardiac Panel (last 3 results) No results found for this basename: CKTOTAL, CKMB, TROPONINI, RELINDX,  in the last 72 hours  Iron/TIBC/Ferritin No results found for this basename: iron, tibc, ferritin     EKG Orders placed in visit on 05/23/12  . EKG 12-LEAD     Prior Assessment and Plan Problem List as of 08/08/2012     ICD-9-CM     Cardiology Problems   HYPERLIPIDEMIA   Last Assessment & Plan   07/30/2012 Office Visit Written 07/30/2012  3:39 PM by Newt Lukes, MD     Statin increase spring 2012 - doing well Recheck annually     Hypertension   Last Assessment & Plan   07/30/2012 Office Visit Written 07/30/2012  3:39 PM by Newt Lukes, MD      stable overall by hx and exam combined amlodipine and ACEI  fall 2012  BP Readings from Last 3 Encounters:  07/30/12 132/70  07/01/12 152/69  05/29/12 120/76      Chronic diastolic heart failure   Last Assessment & Plan   05/23/2012 Office Visit Written 05/23/2012  3:17 PM by Kathlen Brunswick, MD     CHF appears well compensated at the present time despite the fact that she does not require daily diuretic therapy.  A chest x-ray will be obtained to evaluate minimally abnormal findings over the right posterior lung field.    Arteriosclerotic cardiovascular disease (ASCVD)   Last Assessment & Plan   05/23/2012 Office Visit Written 05/23/2012  3:16 PM by Kathlen Brunswick, MD     It is difficult to determine whether a single episode of chest discomfort represented myocardial ischemia.  She is doing well in general and will not require additional testing at present.  We will continue to attempt to maintain optimal control of cardiovascular risk factors.    CEREBROVASCULAR DISEASE   Last Assessment & Plan   05/23/2012 Office Visit Edited 05/24/2012  4:52 PM by Kathlen Brunswick, MD     Appreciable stenosis was present in the right ICA nearly 2 years ago.  Repeat carotid ultrasound will be obtained.    Sinoatrial node dysfunction   Last  Assessment & Plan   04/05/2011 Office Visit Edited 04/06/2011  8:35 AM by Kathlen Brunswick, MD     Recent generator change.  Future appointment with Dr. Ladona Ridgel will be verified.      Other   HYPOTHYROIDISM   Last Assessment & Plan   07/30/2012 Office Visit Written 07/30/2012  3:12 PM by Newt Lukes, MD      Difficult with medication splitting - but otherwise doing well Check lab today - adjust as needed Lab Results  Component Value Date   TSH 0.69 01/24/2012      VITAMIN B12 DEFICIENCY   Obesity, unspecified   Last Assessment & Plan   04/05/2011 Office Visit Written 04/06/2011  8:34 AM by Kathlen Brunswick, MD     Obesity has been a chronic problem.  At least weight is stable since previous visit 4  months ago.  Patient has been unable to restrict calories adequately to lose a significant amount of weight.    ANXIETY   PERIPHERAL NEUROPATHY   COPD   OSTEOARTHRITIS, KNEES, BILATERAL   COLONIC POLYPS, ADENOMATOUS, HX OF   PPM-Medtronic   Last Assessment & Plan   12/03/2011 Office Visit Written 12/03/2011  4:44 PM by Marinus Maw, MD     Her device is working normally. There is longevity approximately 9 years. We'll recheck in several months.    Gastroesophageal reflux disease   Type II or unspecified type diabetes mellitus with neurological manifestations, uncontrolled(250.62)   Last Assessment & Plan   07/30/2012 Office Visit Written 07/30/2012  3:12 PM by Newt Lukes, MD      The current medical regimen is effective;  continue present plan and medications. On ASA, ACEI, statin Lab Results  Component Value Date   HGBA1C 6.8* 01/24/2012      Insomnia   Last Assessment & Plan   04/05/2011 Office Visit Written 04/06/2011  8:38 AM by Kathlen Brunswick, MD     Difficulty falling and staying asleep; History of positive sleep study for obstructive sleep apnea, but patient does not tolerate positive pressure device.  Treatment with benzodiazepines has not been effective and is not desirable in this older woman.  Dr. Felicity Coyer may wish to consider referral to a sleep specialist.    Intertrigo   Last Assessment & Plan   05/29/2012 Office Visit Written 06/04/2012  7:55 PM by Jeoffrey Massed, MD     Question even early super-infection with bacteria. Recommended she restart the mycolog II cream. Also started keflex 500mg  tid.   Emphasized the need to keep these opposing skin surfaces dry, etc.    UTI (lower urinary tract infection)   Last Assessment & Plan   05/29/2012 Office Visit Written 06/04/2012  7:57 PM by Jeoffrey Massed, MD     Send urine for c/s. Start keflex 500mg  tid.        Imaging: No results found.

## 2012-08-09 NOTE — Assessment & Plan Note (Signed)
Patient has known coronary artery disease that was of moderate severity at her most recent catheterization 15 years ago; subsequent stress testing has failed to demonstrate ischemia despite intermittent symptoms. A repeat pharmacologic nuclear study will be obtained.

## 2012-08-09 NOTE — Progress Notes (Signed)
Patient ID: [name] [name], female   DOB: [birth year], 79 y.o.   MRN: [id number]  HPI: Schedule return visit for this nice woman with known coronary disease and cerebrovascular disease who reported significant chest discomfort during a recent evaluation by Dr. Excell Seltzer. His evaluation of carotid ultrasound studies suggested moderate right carotid disease that does not require intervention at present. Patient also reports sharp and fairly localized left chest discomfort without radiation or associated symptoms that typically occurs with mild exertion and is relieved with rest. She experiences these symptoms approximately once per week. There does not appear to be a reliable level of activity that will produce symptoms, which sometimes also occur at rest. She takes nitroglycerin with resolution within a few minutes, but does not know what would happen if she did not use nitroglycerin. Antianginal medication includes amlodipine and metoprolol. Her most recent pharmacologic stress nuclear study was performed in 2010 and was negative.  Current Outpatient Prescriptions  Medication Sig Dispense Refill  . albuterol-ipratropium (COMBIVENT) 18-103 MCG/ACT inhaler Inhale 2 puffs into the lungs every 6 (six) hours as needed. Shortness of breath      . amLODipine-benazepril (LOTREL) 10-20 MG per capsule Take 1 capsule by mouth daily.  90 capsule  3  . aspirin EC 81 MG tablet Take 81 mg by mouth daily.      . furosemide (LASIX) 40 MG tablet Take 40 mg by mouth as needed. Fluid retention      . gabapentin (NEURONTIN) 300 MG capsule Take 1 capsule (300 mg total) by mouth 3 (three) times daily.  90 capsule  1  . glimepiride (AMARYL) 2 MG tablet TAKE 1 TABLET DAILY IN THE MORNING FOR DIABETES  90 tablet  1  . HYDROcodone-acetaminophen (NORCO/VICODIN) 5-325 MG per tablet Take 1 tablet by mouth every 8 (eight) hours as needed.  90 tablet  1  . insulin NPH (HUMULIN N,NOVOLIN N) 100 UNIT/ML injection Inject 22 Units into the skin  2 (two) times daily before a meal.  3 vial  5  . Lancets MISC 1 each daily.       Marland Kitchen LEVOXYL 200 MCG tablet TAKE 1 TABLET DAILY  90 tablet  2  . LEVOXYL 25 MCG tablet TAKE 1 TABLET DAILY WITH TAB  90 tablet  2  . LOTRIMIN AF 1 % cream APPLY TOPICALLY 2 TIMES A DAY  1 Tube  3  . metoprolol succinate (TOPROL-XL) 25 MG 24 hr tablet Take 1 tablet (25 mg total) by mouth daily. Take with or immediately following a meal.  90 tablet  3  . Multiple Vitamin (MULTIVITAMIN) tablet Take 1 tablet by mouth daily.      . nitroGLYCERIN (NITROLINGUAL) 0.4 MG/SPRAY spray Place 1 spray under the tongue every 5 (five) minutes as needed.        . nystatin-triamcinolone (MYCOLOG II) cream APPLY TO AFFECTED AREA 3 TIMES A DAY FOR 7 DAYS AS NEEDED ONLY  60 g  0  . potassium chloride SA (K-DUR,KLOR-CON) 20 MEQ tablet Take 20 mEq by mouth as needed. Take only when lasix is needed      . pravastatin (PRAVACHOL) 80 MG tablet Take 1 tablet (80 mg total) by mouth at bedtime. cholesterol  90 tablet  3  . PRILOSEC 20 MG capsule TAKE 1 CAPSULE ONCE A DAY FOR STOMACH  90 capsule  2  . solifenacin (VESICARE) 5 MG tablet Take 5 mg by mouth daily.      Marland Kitchen XANAX 0.5 MG tablet TAKE  1 TABLET TWICE A DAY AS NEEDED FOR SLEEP OR ANXIETY  180 tablet  1   No current facility-administered medications for this visit.   Allergies  Allergen Reactions  . Penicillins      Past medical history, social history, and family history reviewed and updated.  ROS: Denies orthopnea, PND, pedal edema, dyspnea, palpitations, lightheadedness or syncope. Thyroid disease has been stable with TSH of 0.76 months ago. No known anemia; normal CBC in the 06/2012. All other systems reviewed and are negative.  PHYSICAL EXAM: BP 142/80  Pulse 84  Ht 5\' 10"  (1.778 m)  Wt 117.064 kg (258 lb 1.3 oz)  BMI 37.03 kg/m2  SpO2 94%;  Body mass index is 37.03 kg/(m^2). General-Well developed; no acute distress Body habitus-moderately overweight Neck-No JVD; no  carotid bruits; left carotid endarterectomy scar Lungs-clear lung fields; resonant to percussion Cardiovascular-normal PMI; diminished S1 and S2 Abdomen-normal bowel sounds; soft and non-tender without masses or organomegaly Musculoskeletal-No deformities, no cyanosis or clubbing Neurologic-Normal cranial nerves; symmetric strength and tone Skin-Warm, no significant lesions Extremities-distal pulses intact; no edema  Maxeys Bing, MD 08/09/2012  2:51 PM  ASSESSMENT AND PLAN

## 2012-08-19 ENCOUNTER — Encounter (HOSPITAL_COMMUNITY): Payer: Medicare Other

## 2012-08-19 ENCOUNTER — Inpatient Hospital Stay (HOSPITAL_COMMUNITY): Admission: RE | Admit: 2012-08-19 | Payer: Medicare Other | Source: Ambulatory Visit

## 2012-08-20 ENCOUNTER — Other Ambulatory Visit: Payer: Self-pay | Admitting: *Deleted

## 2012-08-20 MED ORDER — ALPRAZOLAM 0.5 MG PO TABS: 0.5000 mg | ORAL_TABLET | Freq: Two times a day (BID) | ORAL | Status: DC | PRN

## 2012-08-21 NOTE — Telephone Encounter (Signed)
Faxed script back to [facility].../lmb 

## 2012-08-26 ENCOUNTER — Other Ambulatory Visit: Payer: Self-pay | Admitting: Internal Medicine

## 2012-09-04 ENCOUNTER — Encounter (HOSPITAL_COMMUNITY)
Admission: RE | Admit: 2012-09-04 | Discharge: 2012-09-04 | Disposition: A | Discharge status: Home / Self Care | Payer: Medicare Other | Source: Ambulatory Visit | Attending: Cardiology | Admitting: Cardiology

## 2012-09-04 ENCOUNTER — Encounter (HOSPITAL_COMMUNITY): Payer: Self-pay

## 2012-09-04 ENCOUNTER — Ambulatory Visit (HOSPITAL_COMMUNITY)
Admission: RE | Admit: 2012-09-04 | Discharge: 2012-09-04 | Disposition: A | Discharge status: Home / Self Care | Payer: Medicare Other | Source: Ambulatory Visit | Attending: Cardiology | Admitting: Cardiology

## 2012-09-04 DIAGNOSIS — I2510 Atherosclerotic heart disease of native coronary artery without angina pectoris: Secondary | ICD-10-CM | POA: Insufficient documentation

## 2012-09-04 DIAGNOSIS — R079 Chest pain, unspecified: Secondary | ICD-10-CM | POA: Insufficient documentation

## 2012-09-04 DIAGNOSIS — E039 Hypothyroidism, unspecified: Secondary | ICD-10-CM | POA: Insufficient documentation

## 2012-09-04 MED ORDER — TECHNETIUM TC 99M SESTAMIBI - CARDIOLITE
30.0000 | Freq: Once | INTRAVENOUS | Status: AC | PRN
  Administered 2012-09-04: 30 via INTRAVENOUS

## 2012-09-04 MED ORDER — SODIUM CHLORIDE 0.9 % IJ SOLN
INTRAMUSCULAR | Status: AC
  Administered 2012-09-04: 10 mL via INTRAVENOUS
  Filled 2012-09-04: qty 10

## 2012-09-04 MED ORDER — REGADENOSON 0.4 MG/5ML IV SOLN
INTRAVENOUS | Status: AC
  Administered 2012-09-04: 0.4 mg via INTRAVENOUS
  Filled 2012-09-04: qty 5

## 2012-09-04 MED ORDER — TECHNETIUM TC 99M SESTAMIBI - CARDIOLITE
10.0000 | Freq: Once | INTRAVENOUS | Status: AC | PRN
  Administered 2012-09-04: 10:00:00 10 via INTRAVENOUS

## 2012-09-04 NOTE — Progress Notes (Signed)
Stress Lab Nurses Notes - [name] [name] 09/04/2012 Reason for doing test: Chest Pain & ASCVD Type of test: Marlane Hatcher Nurse performing test: Parke Poisson, RN Nuclear Medicine Tech: Lyndel Pleasure Echo Tech: Not Applicable MD performing test: Lovina Reach Family MD: Dr. Felicity Coyer Test explained and consent signed: yes IV started: 22g jelco, Saline lock flushed, No redness or edema and Saline lock started in radiology Symptoms: Stomach discomfort Treatment/Intervention: None Reason test stopped: protocol completed After recovery IV was: Discontinued via X-ray tech and No redness or edema Patient to return to Nuc. Med at : 12:30 Patient discharged: Home Patient's Condition upon discharge was: stable Comments: During test BP 141/46 & HR 64.  Recovery BP 135/67 & HR 69.  Symptom resolved in recovery. Erskine Speed T

## 2012-09-05 ENCOUNTER — Encounter: Payer: Self-pay | Admitting: Cardiology

## 2012-09-05 ENCOUNTER — Telehealth: Payer: Self-pay | Admitting: Cardiology

## 2012-09-05 NOTE — Telephone Encounter (Signed)
Pt had stress test done yesterday and wanting results/tmj

## 2012-09-08 NOTE — Telephone Encounter (Signed)
Awaiting physician to review

## 2012-09-15 ENCOUNTER — Other Ambulatory Visit: Payer: Self-pay | Admitting: *Deleted

## 2012-09-15 ENCOUNTER — Other Ambulatory Visit: Payer: Self-pay | Admitting: Internal Medicine

## 2012-09-15 MED ORDER — HYDROCODONE-ACETAMINOPHEN 5-325 MG PO TABS: 1.0000 | ORAL_TABLET | Freq: Three times a day (TID) | ORAL | Status: DC | PRN

## 2012-09-15 NOTE — Telephone Encounter (Signed)
Faxed script nack to [facility]...lmb

## 2012-09-19 ENCOUNTER — Other Ambulatory Visit: Payer: Self-pay | Admitting: Internal Medicine

## 2012-09-25 ENCOUNTER — Telehealth: Payer: Self-pay | Admitting: *Deleted

## 2012-09-25 NOTE — Telephone Encounter (Signed)
ERROR

## 2012-09-26 ENCOUNTER — Telehealth: Payer: Self-pay | Admitting: Cardiovascular Disease

## 2012-09-26 DIAGNOSIS — I6523 Occlusion and stenosis of bilateral carotid arteries: Secondary | ICD-10-CM

## 2012-09-26 NOTE — Telephone Encounter (Signed)
I spoke with the pt and made her aware that she is due to have a carotid duplex in July and see Dr Excell Seltzer.  The pt would like to have this carotid test performed at [facility] (order placed in system). The pt also asked about the results of her stress test.  I made the pt aware that this is being handled by the [facility] office. I will forward this message to Dr Marvel Plan nurse to address stress test results.

## 2012-09-26 NOTE — Telephone Encounter (Signed)
New problem    Patient wants to know why she has to come back in the office in July to have a carotid doppler.

## 2012-09-29 NOTE — Telephone Encounter (Signed)
Notation from 09-26-12 at 5:45pm via result note as follows:  Lab results/Imaging/Results forwarded to pt PCP for review Scheduled pt for f/u office visit to discuss results on 10-08-12 at 3pm to discuss results, pt accepted apt ------   Will schedule pt carotid duplex at [facility] for 10-08-12

## 2012-10-08 ENCOUNTER — Ambulatory Visit (INDEPENDENT_AMBULATORY_CARE_PROVIDER_SITE_OTHER): Payer: Medicare Other | Admitting: Cardiology

## 2012-10-08 ENCOUNTER — Encounter: Payer: Self-pay | Admitting: Cardiology

## 2012-10-08 VITALS — BP 142/77 | HR 80 | Ht 70.0 in | Wt 263.0 lb

## 2012-10-08 DIAGNOSIS — I2510 Atherosclerotic heart disease of native coronary artery without angina pectoris: Secondary | ICD-10-CM

## 2012-10-08 DIAGNOSIS — I7090 Unspecified atherosclerosis: Secondary | ICD-10-CM

## 2012-10-08 MED ORDER — METOPROLOL SUCCINATE ER 50 MG PO TB24: 50.0000 mg | ORAL_TABLET | Freq: Every day | ORAL | Status: DC

## 2012-10-08 MED ORDER — NITROGLYCERIN 0.4 MG/SPRAY TL SOLN: 1.0000 | Status: DC | PRN

## 2012-10-08 NOTE — Progress Notes (Addendum)
Name: [name] [name]    DOB: 02-Mar-1933  Age: 79 y.o.  MR#: [id number]       PCP:  Rene Paci, MD      Insurance: Payor: Cleatrice Burke MEDICARE  Plan: AARP MEDICARE COMPLETE   HPI: Schedule return visit for This nice woman with coronary disease following a mildly abnormal stress nuclear test, which revealed subtle inferior wall ischemia. Patient continues to experience occasional episodes of left mid chest discomfort without associated symptoms no response to sublingual nitroglycerin. These typically occur at rest and are relieved within a matter of minutes. Lifestyle is relatively sedentary, but she is able to perform her usual activities on most days without difficulty.  Current Outpatient Prescriptions  Medication Sig Dispense Refill  . albuterol-ipratropium (COMBIVENT) 18-103 MCG/ACT inhaler Inhale 2 puffs into the lungs every 6 (six) hours as needed. Shortness of breath      . ALPRAZolam (XANAX) 0.5 MG tablet Take 1 tablet (0.5 mg total) by mouth 2 (two) times daily as needed for sleep or anxiety.  180 tablet  0  . amLODipine-benazepril (LOTREL) 10-20 MG per capsule Take 1 capsule by mouth daily.  90 capsule  3  . aspirin EC 81 MG tablet Take 81 mg by mouth daily.      . furosemide (LASIX) 40 MG tablet Take 40 mg by mouth as needed. Fluid retention      . gabapentin (NEURONTIN) 300 MG capsule       . glimepiride (AMARYL) 2 MG tablet TAKE 1 TABLET DAILY IN THE MORNING FOR DIABETES  90 tablet  1  . HYDROcodone-acetaminophen (NORCO/VICODIN) 5-325 MG per tablet Take 1 tablet by mouth every 8 (eight) hours as needed.  90 tablet  1  . insulin NPH (HUMULIN N,NOVOLIN N) 100 UNIT/ML injection Inject 22 Units into the skin 2 (two) times daily before a meal.  3 vial  5  . Lancets MISC 1 each daily.       Marland Kitchen LEVOXYL 200 MCG tablet TAKE 1 TABLET DAILY  90 tablet  2  . LEVOXYL 25 MCG tablet TAKE 1 TABLET DAILY WITH TAB  90 tablet  2  . LOTRIMIN AF 1 % cream APPLY TOPICALLY 2 TIMES A DAY  1 Tube   3  . meclizine (ANTIVERT) 25 MG tablet TAKE 1 TABLET DAILY AS NEEDED FOR DIZZINESS  30 tablet  1  . metoprolol succinate (TOPROL-XL) 25 MG 24 hr tablet Take 1 tablet (25 mg total) by mouth daily. Take with or immediately following a meal.  90 tablet  3  . Multiple Vitamin (MULTIVITAMIN) tablet Take 1 tablet by mouth daily.      . nitroGLYCERIN (NITROLINGUAL) 0.4 MG/SPRAY spray Place 1 spray under the tongue every 5 (five) minutes as needed.        . nystatin-triamcinolone (MYCOLOG II) cream APPLY TO AFFECTED AREA 3 TIMES A DAY FOR 1 WEEK  60 g  0  . potassium chloride SA (K-DUR,KLOR-CON) 20 MEQ tablet Take 20 mEq by mouth as needed. Take only when lasix is needed      . pravastatin (PRAVACHOL) 80 MG tablet Take 1 tablet (80 mg total) by mouth at bedtime. cholesterol  90 tablet  3  . PRILOSEC 20 MG capsule TAKE 1 CAPSULE ONCE A DAY FOR STOMACH  90 capsule  2  . solifenacin (VESICARE) 5 MG tablet Take 5 mg by mouth daily. If needed      . XANAX 0.5 MG tablet TAKE 1 TABLET TWICE A  DAY AS NEEDED FOR SLEEP OR ANXIETY  180 tablet  1   No current facility-administered medications for this visit.   Allergies  Allergen Reactions  . Penicillins      Past medical history, social history, and family history reviewed and updated.  ROS: Denies orthopnea, PND, palpitations, lightheadedness or syncope. She has chronic pedal edema, worse on the left, where she previously suffered a lower leg fracture. All other systems reviewed and are negative.  PHYSICAL EXAM: BP 142/77  Pulse 80  Ht 5\' 10"  (1.778 m)  Wt 119.296 kg (263 lb)  BMI 37.74 kg/m2;  Body mass index is 37.74 kg/(m^2). General-Well developed; no acute distress Body habitus-proportionate weight and height Neck-No JVD; no carotid bruits Lungs-clear lung fields; resonant to percussion Cardiovascular-normal PMI; normal S1 and S2 Abdomen-normal bowel sounds; soft and non-tender without masses or organomegaly Musculoskeletal-No deformities, no  cyanosis or clubbing Neurologic-Normal cranial nerves; symmetric strength and tone Skin-Warm, no significant lesions Extremities-distal pulses intact; no edema  [place] Bing, MD 10/08/2012  4:32 PM  ASSESSMENT AND PLAN

## 2012-10-08 NOTE — Patient Instructions (Addendum)
Your physician recommends that you schedule a follow-up appointment in: 3 WEEKS  Your physician has recommended you make the following change in your medication:   1) INCREASE METOPROLOL (TOPROL) TO 50MG  ONCE DAILY

## 2012-10-08 NOTE — Assessment & Plan Note (Signed)
Patient continues to experience atypical chest discomfort that may or may not be related to coronary disease. Her dose of amlodipine is maximal, but dose of metoprolol is minimal. We will titrate this upward and assess effect, if any, on chest discomfort. She'll continue to use sublingual nitroglycerin as needed but is advised to seek immediate medical attention for chest discomfort unresponsive to NTG or for more prolonged or severe symptoms.

## 2012-10-09 ENCOUNTER — Other Ambulatory Visit: Payer: Self-pay | Admitting: Cardiovascular Disease

## 2012-10-09 ENCOUNTER — Other Ambulatory Visit: Payer: Self-pay | Admitting: Internal Medicine

## 2012-10-09 DIAGNOSIS — I6523 Occlusion and stenosis of bilateral carotid arteries: Secondary | ICD-10-CM

## 2012-10-12 ENCOUNTER — Encounter: Payer: Self-pay | Admitting: Cardiology

## 2012-10-13 ENCOUNTER — Encounter: Payer: Self-pay | Admitting: Cardiology

## 2012-10-17 ENCOUNTER — Other Ambulatory Visit: Payer: Self-pay | Admitting: Internal Medicine

## 2012-10-17 ENCOUNTER — Other Ambulatory Visit: Payer: Self-pay | Admitting: *Deleted

## 2012-10-17 MED ORDER — ALPRAZOLAM 0.5 MG PO TABS: 0.5000 mg | ORAL_TABLET | Freq: Two times a day (BID) | ORAL | Status: DC | PRN

## 2012-10-17 NOTE — Telephone Encounter (Signed)
Faxed script back to [facility].../lmb 

## 2012-10-31 ENCOUNTER — Ambulatory Visit (INDEPENDENT_AMBULATORY_CARE_PROVIDER_SITE_OTHER): Payer: Medicare Other | Admitting: Cardiology

## 2012-10-31 ENCOUNTER — Other Ambulatory Visit: Payer: Self-pay | Admitting: Internal Medicine

## 2012-10-31 ENCOUNTER — Encounter: Payer: Self-pay | Admitting: Cardiology

## 2012-10-31 VITALS — BP 128/80 | HR 71 | Ht 70.0 in | Wt 262.0 lb

## 2012-10-31 DIAGNOSIS — K219 Gastro-esophageal reflux disease without esophagitis: Secondary | ICD-10-CM

## 2012-10-31 DIAGNOSIS — I7090 Unspecified atherosclerosis: Secondary | ICD-10-CM

## 2012-10-31 DIAGNOSIS — I2510 Atherosclerotic heart disease of native coronary artery without angina pectoris: Secondary | ICD-10-CM

## 2012-10-31 NOTE — Progress Notes (Deleted)
Name: [name] [name]    DOB: 1933/05/27  Age: 79 y.o.  MR#: [id number]       PCP:  Rene Paci, MD      Insurance: Payor: Cleatrice Burke MEDICARE / Plan: AARP MEDICARE COMPLETE / Product Type: *No Product type* /   CC:   No chief complaint on file.  NO LIST VS Filed Vitals:   10/31/12 1437  BP: 128/80  Pulse: 71  Height: 5\' 10"  (1.778 m)  Weight: 262 lb (118.842 kg)  SpO2: 95%    Weights Current Weight  10/31/12 262 lb (118.842 kg)  10/08/12 263 lb (119.296 kg)  08/08/12 258 lb 1.3 oz (117.064 kg)    Blood Pressure  BP Readings from Last 3 Encounters:  10/31/12 128/80  10/08/12 142/77  08/08/12 142/80     Admit date:  (Not on file) Last encounter with RMR:  10/08/2012   Allergy Penicillins  Current Outpatient Prescriptions  Medication Sig Dispense Refill  . albuterol-ipratropium (COMBIVENT) 18-103 MCG/ACT inhaler Inhale 2 puffs into the lungs every 6 (six) hours as needed. Shortness of breath      . ALPRAZolam (XANAX) 0.5 MG tablet Take 1 tablet (0.5 mg total) by mouth 2 (two) times daily as needed for sleep or anxiety.  180 tablet  0  . amLODipine-benazepril (LOTREL) 10-20 MG per capsule Take 1 capsule by mouth daily.  90 capsule  3  . aspirin EC 81 MG tablet Take 81 mg by mouth daily.      . furosemide (LASIX) 40 MG tablet Take 40 mg by mouth as needed. Fluid retention      . gabapentin (NEURONTIN) 300 MG capsule       . glimepiride (AMARYL) 2 MG tablet TAKE 1 TABLET DAILY IN THE MORNING FOR DIABETES  90 tablet  1  . HYDROcodone-acetaminophen (NORCO/VICODIN) 5-325 MG per tablet Take 1 tablet by mouth every 8 (eight) hours as needed.  90 tablet  1  . insulin NPH (HUMULIN N,NOVOLIN N) 100 UNIT/ML injection Inject 22 Units into the skin 2 (two) times daily before a meal.  3 vial  5  . Lancets MISC 1 each daily.       Marland Kitchen LEVOXYL 200 MCG tablet TAKE 1 TABLET DAILY  90 tablet  2  . LEVOXYL 25 MCG tablet TAKE 1 TABLET DAILY WITH TAB  90 tablet  2  . LOTRIMIN AF 1  % cream APPLY TOPICALLY 2 TIMES A DAY  1 Tube  3  . meclizine (ANTIVERT) 25 MG tablet TAKE 1 TABLET DAILY AS NEEDED FOR DIZZINESS  30 tablet  1  . metoprolol succinate (TOPROL-XL) 50 MG 24 hr tablet Take 1 tablet (50 mg total) by mouth daily. Take with or immediately following a meal.  90 tablet  3  . Multiple Vitamin (MULTIVITAMIN) tablet Take 1 tablet by mouth daily.      . nitroGLYCERIN (NITROLINGUAL) 0.4 MG/SPRAY spray Place 1 spray under the tongue every 5 (five) minutes as needed.  12 g  3  . nystatin-triamcinolone (MYCOLOG II) cream APPLY TO AFFECTED AREA 3 TIMES A DAY FOR 1 WEEK  60 g  0  . potassium chloride SA (K-DUR,KLOR-CON) 20 MEQ tablet Take 20 mEq by mouth as needed. Take only when lasix is needed      . pravastatin (PRAVACHOL) 80 MG tablet Take 1 tablet (80 mg total) by mouth at bedtime. cholesterol  90 tablet  3  . PRILOSEC 20 MG capsule TAKE 1 CAPSULE ONCE A  DAY FOR STOMACH  90 capsule  2  . solifenacin (VESICARE) 5 MG tablet Take 5 mg by mouth daily. If needed      . triamcinolone cream (KENALOG) 0.1 % APPLY TO AFFECTED SKIN TWICE A DAY AS NEEDED FOR ITCH  453.6 g  0  . VESICARE 5 MG tablet TAKE 1 TABLET DAILY  90 tablet  1   No current facility-administered medications for this visit.    Discontinued Meds:   There are no discontinued medications.  Patient Active Problem List   Diagnosis Date Noted  . Insomnia 04/06/2011  . Sinoatrial node dysfunction   . Gastroesophageal reflux disease   . Diabetes mellitus, type II   . ANXIETY 12/30/2009  . PERIPHERAL NEUROPATHY 12/26/2009  . VITAMIN B12 DEFICIENCY 10/11/2009  . Obesity 09/16/2009  . Arteriosclerotic cardiovascular disease (ASCVD) 09/15/2009  . CEREBROVASCULAR DISEASE 09/15/2009  . Hyperlipidemia 08/29/2009  . Hypertension 08/29/2009  . OSTEOARTHRITIS, KNEES, BILATERAL 08/29/2009  . COLONIC POLYPS, ADENOMATOUS, HX OF 02/22/2009  . Hypothyroid 02/17/2009  . Chronic obstructive pulmonary disease 02/17/2009  .  Chronic diastolic heart failure 12/15/2008  . PPM-Medtronic 12/15/2008    LABS    Component Value Date/Time   NA 135 06/20/2012 0830   NA 138 12/04/2010 0825   NA 138 07/03/2010 2103   K 4.8 06/20/2012 0830   K 5.1 12/04/2010 0825   K 4.2 07/03/2010 2103   CL 103 06/20/2012 0830   CL 102 12/04/2010 0825   CL 99 07/03/2010 2103   CO2 28 06/20/2012 0830   CO2 26 12/04/2010 0825   CO2 29 07/03/2010 2103   GLUCOSE 146* 06/20/2012 0830   GLUCOSE 111* 12/04/2010 0825   GLUCOSE 174* 07/03/2010 2103   BUN 16 06/20/2012 0830   BUN 12 12/04/2010 0825   BUN 12 07/03/2010 2103   CREATININE 0.95 06/20/2012 0830   CREATININE 1.05 12/04/2010 0825   CREATININE 0.80 07/03/2010 2103   CREATININE 0.9 03/17/2010 1107   CREATININE 0.92 12/25/2009 2356   CALCIUM 10.2 06/20/2012 0830   CALCIUM 9.8 12/04/2010 0825   CALCIUM 9.1 07/03/2010 2103   GFRNONAA 60* 12/25/2009 2356   GFRNONAA 55* 12/18/2009 0820   GFRNONAA >60 12/11/2009 0535   GFRAA  Value: >60        The eGFR has been calculated using the MDRD equation. This calculation has not been validated in all clinical situations. eGFR's persistently <60 mL/min signify possible Chronic Kidney Disease. 12/25/2009 2356   GFRAA  Value: >60        The eGFR has been calculated using the MDRD equation. This calculation has not been validated in all clinical situations. eGFR's persistently <60 mL/min signify possible Chronic Kidney Disease. 12/18/2009 0820   GFRAA  Value: >60        The eGFR has been calculated using the MDRD equation. This calculation has not been validated in all clinical situations. eGFR's persistently <60 mL/min signify possible Chronic Kidney Disease. 12/11/2009 0535   CMP     Component Value Date/Time   NA 135 06/20/2012 0830   K 4.8 06/20/2012 0830   CL 103 06/20/2012 0830   CO2 28 06/20/2012 0830   GLUCOSE 146* 06/20/2012 0830   BUN 16 06/20/2012 0830   CREATININE 0.95 06/20/2012 0830   CREATININE 0.80 07/03/2010 2103   CALCIUM 10.2 06/20/2012 0830   PROT 7.0  06/20/2012 0830   ALBUMIN 4.2 06/20/2012 0830   AST 23 06/20/2012 0830   ALT 21 06/20/2012 0830   ALKPHOS  81 06/20/2012 0830   BILITOT 0.7 06/20/2012 0830   GFRNONAA 60* 12/25/2009 2356   GFRAA  Value: >60        The eGFR has been calculated using the MDRD equation. This calculation has not been validated in all clinical situations. eGFR's persistently <60 mL/min signify possible Chronic Kidney Disease. 12/25/2009 2356       Component Value Date/Time   WBC 9.1 06/20/2012 0830   WBC 6.5 12/04/2010 0825   WBC 7.0 12/30/2009 1148   HGB 14.2 06/20/2012 0830   HGB 14.1 12/04/2010 0825   HGB 13.7 12/30/2009 1148   HCT 41.6 06/20/2012 0830   HCT 43.1 12/04/2010 0825   HCT 39.7 12/30/2009 1148   MCV 87.8 06/20/2012 0830   MCV 87.1 12/04/2010 0825   MCV 91.3 12/30/2009 1148    Lipid Panel     Component Value Date/Time   CHOL 162 10/22/2011 1151   TRIG 159.0* 10/22/2011 1151   HDL 48.80 10/22/2011 1151   CHOLHDL 3 10/22/2011 1151   VLDL 31.8 10/22/2011 1151   LDLCALC 81 10/22/2011 1151    ABG No results found for this basename: phart, pco2, pco2art, po2, po2art, hco3, tco2, acidbasedef, o2sat     Lab Results  Component Value Date   TSH 0.69 01/24/2012   BNP (last 3 results) No results found for this basename: PROBNP,  in the last 8760 hours Cardiac Panel (last 3 results) No results found for this basename: CKTOTAL, CKMB, TROPONINI, RELINDX,  in the last 72 hours  Iron/TIBC/Ferritin No results found for this basename: iron, tibc, ferritin     EKG Orders placed in visit on 05/23/12  . EKG 12-LEAD     Prior Assessment and Plan Problem List as of 10/31/2012   Hypothyroid   Last Assessment & Plan   07/30/2012 Office Visit Written 07/30/2012  3:12 PM by Newt Lukes, MD      Difficult with medication splitting - but otherwise doing well Check lab today - adjust as needed Lab Results  Component Value Date   TSH 0.69 01/24/2012      VITAMIN B12 DEFICIENCY   Hyperlipidemia   Last  Assessment & Plan   07/30/2012 Office Visit Written 07/30/2012  3:39 PM by Newt Lukes, MD     Statin increase spring 2012 - doing well Recheck annually     Obesity   Last Assessment & Plan   04/05/2011 Office Visit Written 04/06/2011  8:34 AM by Kathlen Brunswick, MD     Obesity has been a chronic problem.  At least weight is stable since previous visit 4 months ago.  Patient has been unable to restrict calories adequately to lose a significant amount of weight.    ANXIETY   PERIPHERAL NEUROPATHY   Hypertension   Last Assessment & Plan   07/30/2012 Office Visit Written 07/30/2012  3:39 PM by Newt Lukes, MD      stable overall by hx and exam combined amlodipine and ACEI fall 2012  BP Readings from Last 3 Encounters:  07/30/12 132/70  07/01/12 152/69  05/29/12 120/76      Chronic diastolic heart failure   Last Assessment & Plan   05/23/2012 Office Visit Written 05/23/2012  3:17 PM by Kathlen Brunswick, MD     CHF appears well compensated at the present time despite the fact that she does not require daily diuretic therapy.  A chest x-ray will be obtained to evaluate minimally abnormal findings over the right posterior  lung field.    Arteriosclerotic cardiovascular disease (ASCVD)   Last Assessment & Plan   10/08/2012 Office Visit Written 10/08/2012  4:34 PM by Kathlen Brunswick, MD     Patient continues to experience atypical chest discomfort that may or may not be related to coronary disease. Her dose of amlodipine is maximal, but dose of metoprolol is minimal. We will titrate this upward and assess effect, if any, on chest discomfort. She'll continue to use sublingual nitroglycerin as needed but is advised to seek immediate medical attention for chest discomfort unresponsive to NTG or for more prolonged or severe symptoms.    CEREBROVASCULAR DISEASE   Last Assessment & Plan   05/23/2012 Office Visit Edited 05/24/2012  4:52 PM by Kathlen Brunswick, MD     Appreciable  stenosis was present in the right ICA nearly 2 years ago.  Repeat carotid ultrasound will be obtained.    Chronic obstructive pulmonary disease   OSTEOARTHRITIS, KNEES, BILATERAL   COLONIC POLYPS, ADENOMATOUS, HX OF   PPM-Medtronic   Last Assessment & Plan   12/03/2011 Office Visit Written 12/03/2011  4:44 PM by Marinus Maw, MD     Her device is working normally. There is longevity approximately 9 years. We'll recheck in several months.    Sinoatrial node dysfunction   Last Assessment & Plan   04/05/2011 Office Visit Edited 04/06/2011  8:35 AM by Kathlen Brunswick, MD     Recent generator change.  Future appointment with Dr. Ladona Ridgel will be verified.    Gastroesophageal reflux disease   Diabetes mellitus, type II   Last Assessment & Plan   07/30/2012 Office Visit Written 07/30/2012  3:12 PM by Newt Lukes, MD      The current medical regimen is effective;  continue present plan and medications. On ASA, ACEI, statin Lab Results  Component Value Date   HGBA1C 6.8* 01/24/2012      Insomnia   Last Assessment & Plan   04/05/2011 Office Visit Written 04/06/2011  8:38 AM by Kathlen Brunswick, MD     Difficulty falling and staying asleep; History of positive sleep study for obstructive sleep apnea, but patient does not tolerate positive pressure device.  Treatment with benzodiazepines has not been effective and is not desirable in this older woman.  Dr. Felicity Coyer may wish to consider referral to a sleep specialist.        Imaging: No results found.

## 2012-10-31 NOTE — Patient Instructions (Addendum)
Your physician recommends that you schedule a follow-up appointment in: NEXT WED OR Thursday  Your physician has recommended you make the following change in your medication:   1) INCREASE YOUR PRILOSEC TO 20MG  TWICE DAILY FOR ONE WEEK  2) START GAS-X OTC, ONE TAB FOUR TIMES DAILY

## 2012-10-31 NOTE — Progress Notes (Signed)
Patient ID: [name] [name], female   DOB: [birth year], 79 y.o.   MRN: [id number]  HPI: Schedule return visit for continuing assessment of chest discomfort. The patient initially did well following adjustment of medication, but has noted excessive eructation over the past 12 hours, anorexia, regurgitation of stomach contents and mild localized left anterior chest discomfort without chest wall tenderness. There is no radiation of her discomfort and no exacerbation with movement of the trunk, upper extremities or with respiration. She experienced emesis after eating breakfast this morning and has not eaten since. She takes Prilosec on a regular basis and has experienced no classic heartburn, abdominal discomfort or change in bowel habit.  Current Outpatient Prescriptions  Medication Sig Dispense Refill  . albuterol-ipratropium (COMBIVENT) 18-103 MCG/ACT inhaler Inhale 2 puffs into the lungs every 6 (six) hours as needed. Shortness of breath      . ALPRAZolam (XANAX) 0.5 MG tablet Take 1 tablet (0.5 mg total) by mouth 2 (two) times daily as needed for sleep or anxiety.  180 tablet  0  . amLODipine-benazepril (LOTREL) 10-20 MG per capsule Take 1 capsule by mouth daily.  90 capsule  3  . aspirin EC 81 MG tablet Take 81 mg by mouth daily.      . furosemide (LASIX) 40 MG tablet Take 40 mg by mouth as needed. Fluid retention      . gabapentin (NEURONTIN) 300 MG capsule       . glimepiride (AMARYL) 2 MG tablet TAKE 1 TABLET DAILY IN THE MORNING FOR DIABETES  90 tablet  1  . HYDROcodone-acetaminophen (NORCO/VICODIN) 5-325 MG per tablet Take 1 tablet by mouth every 8 (eight) hours as needed.  90 tablet  1  . insulin NPH (HUMULIN N,NOVOLIN N) 100 UNIT/ML injection Inject 22 Units into the skin 2 (two) times daily before a meal.  3 vial  5  . Lancets MISC 1 each daily.       Marland Kitchen LEVOXYL 200 MCG tablet TAKE 1 TABLET DAILY  90 tablet  2  . LEVOXYL 25 MCG tablet TAKE 1 TABLET DAILY WITH TAB  90 tablet  2  .  LOTRIMIN AF 1 % cream APPLY TOPICALLY 2 TIMES A DAY  1 Tube  3  . meclizine (ANTIVERT) 25 MG tablet TAKE 1 TABLET DAILY AS NEEDED FOR DIZZINESS  30 tablet  1  . metoprolol succinate (TOPROL-XL) 50 MG 24 hr tablet Take 1 tablet (50 mg total) by mouth daily. Take with or immediately following a meal.  90 tablet  3  . Multiple Vitamin (MULTIVITAMIN) tablet Take 1 tablet by mouth daily.      . nitroGLYCERIN (NITROLINGUAL) 0.4 MG/SPRAY spray Place 1 spray under the tongue every 5 (five) minutes as needed.  12 g  3  . nystatin-triamcinolone (MYCOLOG II) cream APPLY TO AFFECTED AREA 3 TIMES A DAY FOR 1 WEEK  60 g  0  . potassium chloride SA (K-DUR,KLOR-CON) 20 MEQ tablet Take 20 mEq by mouth as needed. Take only when lasix is needed      . pravastatin (PRAVACHOL) 80 MG tablet Take 1 tablet (80 mg total) by mouth at bedtime. cholesterol  90 tablet  3  . PRILOSEC 20 MG capsule TAKE 1 CAPSULE ONCE A DAY FOR STOMACH  90 capsule  2  . solifenacin (VESICARE) 5 MG tablet Take 5 mg by mouth daily. If needed      . triamcinolone cream (KENALOG) 0.1 % APPLY TO AFFECTED SKIN TWICE A DAY AS  NEEDED FOR ITCH  453.6 g  0  . VESICARE 5 MG tablet TAKE 1 TABLET DAILY  90 tablet  1   No current facility-administered medications for this visit.   Allergies  Allergen Reactions  . Penicillins      Past medical history, social history, and family history reviewed and updated.  ROS: Denies dyspnea, diaphoresis, palpitations, lightheadedness or syncope. All other systems reviewed and are negative except as noted above.  PHYSICAL EXAM: BP 128/80  Pulse 71  Ht 5\' 10"  (1.778 m)  Wt 118.842 kg (262 lb)  BMI 37.59 kg/m2  SpO2 95%;  Body mass index is 37.59 kg/(m^2). General-Well developed; recurrent eructation with mild distress Body habitus-moderately overweight Neck-No JVD; no carotid bruits Lungs-clear lung fields; resonant to percussion Cardiovascular-normal PMI; normal S1 and S2; minimal basilar systolic ejection  murmur Abdomen-normal bowel sounds; soft and non-tender without masses or organomegaly Musculoskeletal-No deformities, no cyanosis or clubbing Neurologic-Normal cranial nerves; symmetric strength and tone Skin-Warm, no significant lesions Extremities-distal pulses intact; 1-2+ ankle edema  Dooling Bing, MD 10/31/2012  3:52 PM  ASSESSMENT AND PLAN

## 2012-10-31 NOTE — Assessment & Plan Note (Addendum)
Chest discomfort reported at her previous office visit virtually resolved following an increase in her dose of metoprolol. She now has chest discomfort that is associated with obvious GI symptoms and that remains extremely atypical for coronary disease. We will treat her GI issues and pursue additional cardiology assessment as necessary.

## 2012-10-31 NOTE — Assessment & Plan Note (Signed)
Excessive upper abdominal gas raises a question of dysmotility; however, patient has had no similar symptoms in the past. We will treat symptomatically with an increase in her dose of PPI and with simethicone. She will seek evaluation from her PCP for increasing symptoms and return to see [place] in one week for reassessment.

## 2012-11-05 ENCOUNTER — Encounter: Payer: Self-pay | Admitting: Cardiology

## 2012-11-05 ENCOUNTER — Ambulatory Visit (INDEPENDENT_AMBULATORY_CARE_PROVIDER_SITE_OTHER): Payer: Medicare Other | Admitting: Cardiology

## 2012-11-05 ENCOUNTER — Telehealth: Payer: Self-pay | Admitting: *Deleted

## 2012-11-05 VITALS — BP 170/78 | HR 75 | Ht 70.0 in | Wt 266.8 lb

## 2012-11-05 DIAGNOSIS — I2510 Atherosclerotic heart disease of native coronary artery without angina pectoris: Secondary | ICD-10-CM

## 2012-11-05 DIAGNOSIS — I5032 Chronic diastolic (congestive) heart failure: Secondary | ICD-10-CM

## 2012-11-05 DIAGNOSIS — I10 Essential (primary) hypertension: Secondary | ICD-10-CM

## 2012-11-05 DIAGNOSIS — K219 Gastro-esophageal reflux disease without esophagitis: Secondary | ICD-10-CM

## 2012-11-05 DIAGNOSIS — Z950 Presence of cardiac pacemaker: Secondary | ICD-10-CM

## 2012-11-05 DIAGNOSIS — I679 Cerebrovascular disease, unspecified: Secondary | ICD-10-CM

## 2012-11-05 DIAGNOSIS — G609 Hereditary and idiopathic neuropathy, unspecified: Secondary | ICD-10-CM

## 2012-11-05 DIAGNOSIS — I7090 Unspecified atherosclerosis: Secondary | ICD-10-CM

## 2012-11-05 MED ORDER — GABAPENTIN 300 MG PO CAPS: 300.0000 mg | ORAL_CAPSULE | Freq: Three times a day (TID) | ORAL | Status: DC

## 2012-11-05 MED ORDER — CHLORTHALIDONE 25 MG PO TABS: 25.0000 mg | ORAL_TABLET | Freq: Every day | ORAL | Status: DC

## 2012-11-05 NOTE — Progress Notes (Deleted)
Name: [name] [name]    DOB: 1933-02-20  Age: 79 y.o.  MR#: [id number]       PCP:  Rene Paci, MD      Insurance: Payor: Cleatrice Burke MEDICARE / Plan: AARP MEDICARE COMPLETE / Product Type: *No Product type* /   CC:   No chief complaint on file. NO LIST  VS Filed Vitals:   11/05/12 1534  BP: 170/78  Pulse: 75  Height: 5\' 10"  (1.778 m)  Weight: 266 lb 12 oz (120.997 kg)    Weights Current Weight  11/05/12 266 lb 12 oz (120.997 kg)  10/31/12 262 lb (118.842 kg)  10/08/12 263 lb (119.296 kg)    Blood Pressure  BP Readings from Last 3 Encounters:  11/05/12 170/78  10/31/12 128/80  10/08/12 142/77     Admit date:  (Not on file) Last encounter with RMR:  10/31/2012   Allergy Penicillins  Current Outpatient Prescriptions  Medication Sig Dispense Refill  . albuterol-ipratropium (COMBIVENT) 18-103 MCG/ACT inhaler Inhale 2 puffs into the lungs every 6 (six) hours as needed. Shortness of breath      . ALPRAZolam (XANAX) 0.5 MG tablet Take 1 tablet (0.5 mg total) by mouth 2 (two) times daily as needed for sleep or anxiety.  180 tablet  0  . amLODipine-benazepril (LOTREL) 10-20 MG per capsule Take 1 capsule by mouth daily.  90 capsule  3  . aspirin EC 81 MG tablet Take 81 mg by mouth daily.      . furosemide (LASIX) 40 MG tablet Take 40 mg by mouth as needed. Fluid retention      . gabapentin (NEURONTIN) 300 MG capsule       . glimepiride (AMARYL) 2 MG tablet TAKE 1 TABLET DAILY IN THE MORNING FOR DIABETES  90 tablet  1  . HYDROcodone-acetaminophen (NORCO/VICODIN) 5-325 MG per tablet Take 1 tablet by mouth every 8 (eight) hours as needed.  90 tablet  1  . insulin NPH (HUMULIN N,NOVOLIN N) 100 UNIT/ML injection Inject 22 Units into the skin 2 (two) times daily before a meal.  3 vial  5  . Lancets MISC 1 each daily.       Marland Kitchen LEVOXYL 200 MCG tablet TAKE 1 TABLET DAILY  90 tablet  2  . LEVOXYL 25 MCG tablet TAKE 1 TABLET DAILY WITH TAB  90 tablet  2  . LOTRIMIN AF 1 %  cream APPLY TOPICALLY 2 TIMES A DAY  1 Tube  3  . meclizine (ANTIVERT) 25 MG tablet TAKE 1 TABLET DAILY AS NEEDED FOR DIZZINESS  30 tablet  1  . metoprolol succinate (TOPROL-XL) 50 MG 24 hr tablet Take 1 tablet (50 mg total) by mouth daily. Take with or immediately following a meal.  90 tablet  3  . Multiple Vitamin (MULTIVITAMIN) tablet Take 1 tablet by mouth daily.      . nitroGLYCERIN (NITROLINGUAL) 0.4 MG/SPRAY spray Place 1 spray under the tongue every 5 (five) minutes as needed.  12 g  3  . nystatin-triamcinolone (MYCOLOG II) cream APPLY TO AFFECTED AREA 3 TIMES A DAY FOR 1 WEEK  60 g  0  . potassium chloride SA (K-DUR,KLOR-CON) 20 MEQ tablet Take 20 mEq by mouth as needed. Take only when lasix is needed      . pravastatin (PRAVACHOL) 80 MG tablet Take 1 tablet (80 mg total) by mouth at bedtime. cholesterol  90 tablet  3  . PRILOSEC 20 MG capsule TAKE 1 CAPSULE ONCE A DAY  FOR STOMACH  90 capsule  2  . solifenacin (VESICARE) 5 MG tablet Take 5 mg by mouth daily. If needed      . triamcinolone cream (KENALOG) 0.1 % APPLY TO AFFECTED SKIN TWICE A DAY AS NEEDED FOR ITCH  453.6 g  0  . VESICARE 5 MG tablet TAKE 1 TABLET DAILY  90 tablet  1   No current facility-administered medications for this visit.    Discontinued Meds:   There are no discontinued medications.  Patient Active Problem List   Diagnosis Date Noted  . Insomnia 04/06/2011  . Sinoatrial node dysfunction   . Gastroesophageal reflux disease   . Diabetes mellitus, type II   . ANXIETY 12/30/2009  . PERIPHERAL NEUROPATHY 12/26/2009  . VITAMIN B12 DEFICIENCY 10/11/2009  . Obesity 09/16/2009  . Arteriosclerotic cardiovascular disease (ASCVD) 09/15/2009  . CEREBROVASCULAR DISEASE 09/15/2009  . Hyperlipidemia 08/29/2009  . Hypertension 08/29/2009  . OSTEOARTHRITIS, KNEES, BILATERAL 08/29/2009  . COLONIC POLYPS, ADENOMATOUS, HX OF 02/22/2009  . Hypothyroid 02/17/2009  . Chronic obstructive pulmonary disease 02/17/2009  .  Chronic diastolic heart failure 12/15/2008  . PPM-Medtronic 12/15/2008    LABS    Component Value Date/Time   NA 135 06/20/2012 0830   NA 138 12/04/2010 0825   NA 138 07/03/2010 2103   K 4.8 06/20/2012 0830   K 5.1 12/04/2010 0825   K 4.2 07/03/2010 2103   CL 103 06/20/2012 0830   CL 102 12/04/2010 0825   CL 99 07/03/2010 2103   CO2 28 06/20/2012 0830   CO2 26 12/04/2010 0825   CO2 29 07/03/2010 2103   GLUCOSE 146* 06/20/2012 0830   GLUCOSE 111* 12/04/2010 0825   GLUCOSE 174* 07/03/2010 2103   BUN 16 06/20/2012 0830   BUN 12 12/04/2010 0825   BUN 12 07/03/2010 2103   CREATININE 0.95 06/20/2012 0830   CREATININE 1.05 12/04/2010 0825   CREATININE 0.80 07/03/2010 2103   CREATININE 0.9 03/17/2010 1107   CREATININE 0.92 12/25/2009 2356   CALCIUM 10.2 06/20/2012 0830   CALCIUM 9.8 12/04/2010 0825   CALCIUM 9.1 07/03/2010 2103   GFRNONAA 60* 12/25/2009 2356   GFRNONAA 55* 12/18/2009 0820   GFRNONAA >60 12/11/2009 0535   GFRAA  Value: >60        The eGFR has been calculated using the MDRD equation. This calculation has not been validated in all clinical situations. eGFR's persistently <60 mL/min signify possible Chronic Kidney Disease. 12/25/2009 2356   GFRAA  Value: >60        The eGFR has been calculated using the MDRD equation. This calculation has not been validated in all clinical situations. eGFR's persistently <60 mL/min signify possible Chronic Kidney Disease. 12/18/2009 0820   GFRAA  Value: >60        The eGFR has been calculated using the MDRD equation. This calculation has not been validated in all clinical situations. eGFR's persistently <60 mL/min signify possible Chronic Kidney Disease. 12/11/2009 0535   CMP     Component Value Date/Time   NA 135 06/20/2012 0830   K 4.8 06/20/2012 0830   CL 103 06/20/2012 0830   CO2 28 06/20/2012 0830   GLUCOSE 146* 06/20/2012 0830   BUN 16 06/20/2012 0830   CREATININE 0.95 06/20/2012 0830   CREATININE 0.80 07/03/2010 2103   CALCIUM 10.2 06/20/2012 0830   PROT 7.0  06/20/2012 0830   ALBUMIN 4.2 06/20/2012 0830   AST 23 06/20/2012 0830   ALT 21 06/20/2012 0830   ALKPHOS 81  06/20/2012 0830   BILITOT 0.7 06/20/2012 0830   GFRNONAA 60* 12/25/2009 2356   GFRAA  Value: >60        The eGFR has been calculated using the MDRD equation. This calculation has not been validated in all clinical situations. eGFR's persistently <60 mL/min signify possible Chronic Kidney Disease. 12/25/2009 2356       Component Value Date/Time   WBC 9.1 06/20/2012 0830   WBC 6.5 12/04/2010 0825   WBC 7.0 12/30/2009 1148   HGB 14.2 06/20/2012 0830   HGB 14.1 12/04/2010 0825   HGB 13.7 12/30/2009 1148   HCT 41.6 06/20/2012 0830   HCT 43.1 12/04/2010 0825   HCT 39.7 12/30/2009 1148   MCV 87.8 06/20/2012 0830   MCV 87.1 12/04/2010 0825   MCV 91.3 12/30/2009 1148    Lipid Panel     Component Value Date/Time   CHOL 162 10/22/2011 1151   TRIG 159.0* 10/22/2011 1151   HDL 48.80 10/22/2011 1151   CHOLHDL 3 10/22/2011 1151   VLDL 31.8 10/22/2011 1151   LDLCALC 81 10/22/2011 1151    ABG No results found for this basename: phart, pco2, pco2art, po2, po2art, hco3, tco2, acidbasedef, o2sat     Lab Results  Component Value Date   TSH 0.69 01/24/2012   BNP (last 3 results) No results found for this basename: PROBNP,  in the last 8760 hours Cardiac Panel (last 3 results) No results found for this basename: CKTOTAL, CKMB, TROPONINI, RELINDX,  in the last 72 hours  Iron/TIBC/Ferritin No results found for this basename: iron, tibc, ferritin     EKG Orders placed in visit on 05/23/12  . EKG 12-LEAD     Prior Assessment and Plan Problem List as of 11/05/2012   Hypothyroid   Last Assessment & Plan   07/30/2012 Office Visit Written 07/30/2012  3:12 PM by Newt Lukes, MD      Difficult with medication splitting - but otherwise doing well Check lab today - adjust as needed Lab Results  Component Value Date   TSH 0.69 01/24/2012      VITAMIN B12 DEFICIENCY   Hyperlipidemia   Last  Assessment & Plan   07/30/2012 Office Visit Written 07/30/2012  3:39 PM by Newt Lukes, MD     Statin increase spring 2012 - doing well Recheck annually     Obesity   Last Assessment & Plan   04/05/2011 Office Visit Written 04/06/2011  8:34 AM by Kathlen Brunswick, MD     Obesity has been a chronic problem.  At least weight is stable since previous visit 4 months ago.  Patient has been unable to restrict calories adequately to lose a significant amount of weight.    ANXIETY   PERIPHERAL NEUROPATHY   Hypertension   Last Assessment & Plan   07/30/2012 Office Visit Written 07/30/2012  3:39 PM by Newt Lukes, MD      stable overall by hx and exam combined amlodipine and ACEI fall 2012  BP Readings from Last 3 Encounters:  07/30/12 132/70  07/01/12 152/69  05/29/12 120/76      Chronic diastolic heart failure   Last Assessment & Plan   05/23/2012 Office Visit Written 05/23/2012  3:17 PM by Kathlen Brunswick, MD     CHF appears well compensated at the present time despite the fact that she does not require daily diuretic therapy.  A chest x-ray will be obtained to evaluate minimally abnormal findings over the right posterior lung  field.    Arteriosclerotic cardiovascular disease (ASCVD)   Last Assessment & Plan   10/31/2012 Office Visit Edited 11/01/2012  4:31 PM by Kathlen Brunswick, MD     Chest discomfort reported at her previous office visit virtually resolved following an increase in her dose of metoprolol. She now has chest discomfort that is associated with obvious GI symptoms and that remains extremely atypical for coronary disease. We will treat her GI issues and pursue additional cardiology assessment as necessary.    CEREBROVASCULAR DISEASE   Last Assessment & Plan   05/23/2012 Office Visit Edited 05/24/2012  4:52 PM by Kathlen Brunswick, MD     Appreciable stenosis was present in the right ICA nearly 2 years ago.  Repeat carotid ultrasound will be obtained.     Chronic obstructive pulmonary disease   OSTEOARTHRITIS, KNEES, BILATERAL   COLONIC POLYPS, ADENOMATOUS, HX OF   PPM-Medtronic   Last Assessment & Plan   12/03/2011 Office Visit Written 12/03/2011  4:44 PM by Marinus Maw, MD     Her device is working normally. There is longevity approximately 9 years. We'll recheck in several months.    Sinoatrial node dysfunction   Last Assessment & Plan   04/05/2011 Office Visit Edited 04/06/2011  8:35 AM by Kathlen Brunswick, MD     Recent generator change.  Future appointment with Dr. Ladona Ridgel will be verified.    Gastroesophageal reflux disease   Last Assessment & Plan   10/31/2012 Office Visit Written 10/31/2012  3:58 PM by Kathlen Brunswick, MD     Excessive upper abdominal gas raises a question of dysmotility; however, patient has had no similar symptoms in the past. We will treat symptomatically with an increase in her dose of PPI and with simethicone. She will seek evaluation from her PCP for increasing symptoms and return to see [place] in one week for reassessment.    Diabetes mellitus, type II   Last Assessment & Plan   07/30/2012 Office Visit Written 07/30/2012  3:12 PM by Newt Lukes, MD      The current medical regimen is effective;  continue present plan and medications. On ASA, ACEI, statin Lab Results  Component Value Date   HGBA1C 6.8* 01/24/2012      Insomnia   Last Assessment & Plan   04/05/2011 Office Visit Written 04/06/2011  8:38 AM by Kathlen Brunswick, MD     Difficulty falling and staying asleep; History of positive sleep study for obstructive sleep apnea, but patient does not tolerate positive pressure device.  Treatment with benzodiazepines has not been effective and is not desirable in this older woman.  Dr. Felicity Coyer may wish to consider referral to a sleep specialist.        Imaging: No results found.

## 2012-11-05 NOTE — Patient Instructions (Addendum)
Your physician recommends that you schedule a follow-up appointment in: 3 weeks with RR  Your physician recommends that you return for lab work in: 3 WEEKS The patient's paper medical record is not available during this visit. It has been removed from this office and cannot be located.BMET  Your physician has recommended you make the following change in your medication:   1) START CHLORTHALIDONE 25MG  EVERY MORNING 2) USE LASIX ONLY FOR MARKED SWELLING 3) TAKE PRILOSEC ONCE DAILY 4) TAKE SIMETHICONE (GAS-X) AS NEEDED ONLY 5) INCREASE NEURONTIN TO 300MG  THREE TIMES DAILY

## 2012-11-05 NOTE — Progress Notes (Signed)
Patient ID: [name] [name], female   DOB: Jun 30, 1932, 79 y.o.   MRN: [id number]  HPI: Schedule return visit for this nice woman with chest pain possibly as the result of coronary artery disease.  Since her last visit, she has done well. Her GI symptoms have resolved. She's had no further chest discomfort. Her principal complaint today is of lower extremity burning discomfort that has been attributed to neuropathy in the past.  Current Outpatient Prescriptions  Medication Sig Dispense Refill  . albuterol-ipratropium (COMBIVENT) 18-103 MCG/ACT inhaler Inhale 2 puffs into the lungs every 6 (six) hours as needed. Shortness of breath      . ALPRAZolam (XANAX) 0.5 MG tablet Take 1 tablet (0.5 mg total) by mouth 2 (two) times daily as needed for sleep or anxiety.  180 tablet  0  . amLODipine-benazepril (LOTREL) 10-20 MG per capsule Take 1 capsule by mouth daily.  90 capsule  3  . aspirin EC 81 MG tablet Take 81 mg by mouth daily.      . furosemide (LASIX) 40 MG tablet Take 40 mg by mouth as needed. Fluid retention      . gabapentin (NEURONTIN) 300 MG capsule       . glimepiride (AMARYL) 2 MG tablet TAKE 1 TABLET DAILY IN THE MORNING FOR DIABETES  90 tablet  1  . HYDROcodone-acetaminophen (NORCO/VICODIN) 5-325 MG per tablet Take 1 tablet by mouth every 8 (eight) hours as needed.  90 tablet  1  . insulin NPH (HUMULIN N,NOVOLIN N) 100 UNIT/ML injection Inject 22 Units into the skin 2 (two) times daily before a meal.  3 vial  5  . Lancets MISC 1 each daily.       Marland Kitchen LEVOXYL 200 MCG tablet TAKE 1 TABLET DAILY  90 tablet  2  . LEVOXYL 25 MCG tablet TAKE 1 TABLET DAILY WITH TAB  90 tablet  2  . LOTRIMIN AF 1 % cream APPLY TOPICALLY 2 TIMES A DAY  1 Tube  3  . meclizine (ANTIVERT) 25 MG tablet TAKE 1 TABLET DAILY AS NEEDED FOR DIZZINESS  30 tablet  1  . metoprolol succinate (TOPROL-XL) 50 MG 24 hr tablet Take 1 tablet (50 mg total) by mouth daily. Take with or immediately following a meal.  90 tablet  3  .  Multiple Vitamin (MULTIVITAMIN) tablet Take 1 tablet by mouth daily.      . nitroGLYCERIN (NITROLINGUAL) 0.4 MG/SPRAY spray Place 1 spray under the tongue every 5 (five) minutes as needed.  12 g  3  . nystatin-triamcinolone (MYCOLOG II) cream APPLY TO AFFECTED AREA 3 TIMES A DAY FOR 1 WEEK  60 g  0  . potassium chloride SA (K-DUR,KLOR-CON) 20 MEQ tablet Take 20 mEq by mouth as needed. Take only when lasix is needed      . pravastatin (PRAVACHOL) 80 MG tablet Take 1 tablet (80 mg total) by mouth at bedtime. cholesterol  90 tablet  3  . PRILOSEC 20 MG capsule TAKE 1 CAPSULE ONCE A DAY FOR STOMACH  90 capsule  2  . solifenacin (VESICARE) 5 MG tablet Take 5 mg by mouth daily. If needed      . triamcinolone cream (KENALOG) 0.1 % APPLY TO AFFECTED SKIN TWICE A DAY AS NEEDED FOR ITCH  453.6 g  0  . VESICARE 5 MG tablet TAKE 1 TABLET DAILY  90 tablet  1   No current facility-administered medications for this visit.   Allergies  Allergen Reactions  .  Penicillins      Past medical history, social history, and family history reviewed and updated.  ROS: Denies chest pain, dyspnea, orthopnea or PND. She has had no lightheadedness nor syncope. No change in bowel habit with resolution of eructation. All other systems reviewed and are negative.  PHYSICAL EXAM: BP 170/78  Pulse 75  Ht 5\' 10"  (1.778 m)  Wt 120.997 kg (266 lb 12 oz)  BMI 38.27 kg/m2;  Body mass index is 38.27 kg/(m^2). Repeat blood pressure in the right arm sitting-165/70 General-Well developed; no acute distress Body habitus-moderately overweight Neck-No JVD; no carotid bruits Lungs-clear lung fields; resonant to percussion Cardiovascular-normal PMI; normal S1 and S2; modest basilar systolic ejection murmur  Abdomen-normal bowel sounds; soft and non-tender without masses or organomegaly Musculoskeletal-No deformities, no cyanosis or clubbing Neurologic-Normal cranial nerves; symmetric strength and tone; absent vibration sense in  lower extremities Skin-Warm, no significant lesions Extremities-distal pulses intact; 1-2+ ankle edema  [place], MD 11/05/2012  4:11 PM  ASSESSMENT AND PLAN

## 2012-11-05 NOTE — Telephone Encounter (Signed)
Pt unable to come in the 3 week time frame advised per going out of town, f/u apt scheduled for 12-04-12 and labs moved to 12-01-12 per YL, please advise if this change is acceptable

## 2012-11-07 NOTE — Telephone Encounter (Signed)
Scheduled appointment is fine.

## 2012-11-11 NOTE — Assessment & Plan Note (Signed)
GI symptoms now adequately controlled. Simethicone will be used in the future as needed. Prilosec dosage will be decreased to 20 mg per day.

## 2012-11-11 NOTE — Assessment & Plan Note (Addendum)
Chlorthalidone will be added for better control of edema and hypertension. Furosemide will be available for use on an as-needed basis.

## 2012-11-11 NOTE — Assessment & Plan Note (Signed)
Followed by Dr. Excell Seltzer with moderately severe bilateral disease.

## 2012-11-11 NOTE — Assessment & Plan Note (Signed)
Blood pressure control has been excellent for the past year, but systolic is elevated at today's visit. Chlorthalidone will be added to her medical regime.

## 2012-11-11 NOTE — Assessment & Plan Note (Signed)
Symptoms of peripheral neuropathy persists. Dose of Neurontin will be increased.

## 2012-11-11 NOTE — Assessment & Plan Note (Signed)
No symptoms at present suggesting progression of coronary disease or active myocardial ischemia. We will continue to attempt to optimize control of cardiovascular risk factors.

## 2012-11-20 ENCOUNTER — Other Ambulatory Visit: Payer: Self-pay | Admitting: Internal Medicine

## 2012-11-21 NOTE — Telephone Encounter (Signed)
Rx faxed to [facility] 

## 2012-12-04 ENCOUNTER — Encounter: Payer: Self-pay | Admitting: Cardiology

## 2012-12-04 ENCOUNTER — Ambulatory Visit (INDEPENDENT_AMBULATORY_CARE_PROVIDER_SITE_OTHER): Payer: Medicare Other | Admitting: Cardiology

## 2012-12-04 VITALS — BP 140/68 | HR 76 | Ht 70.0 in | Wt 263.4 lb

## 2012-12-04 DIAGNOSIS — I5032 Chronic diastolic (congestive) heart failure: Secondary | ICD-10-CM

## 2012-12-04 DIAGNOSIS — E785 Hyperlipidemia, unspecified: Secondary | ICD-10-CM

## 2012-12-04 DIAGNOSIS — I7090 Unspecified atherosclerosis: Secondary | ICD-10-CM

## 2012-12-04 DIAGNOSIS — I10 Essential (primary) hypertension: Secondary | ICD-10-CM

## 2012-12-04 DIAGNOSIS — K219 Gastro-esophageal reflux disease without esophagitis: Secondary | ICD-10-CM

## 2012-12-04 DIAGNOSIS — I2510 Atherosclerotic heart disease of native coronary artery without angina pectoris: Secondary | ICD-10-CM

## 2012-12-04 DIAGNOSIS — Z950 Presence of cardiac pacemaker: Secondary | ICD-10-CM

## 2012-12-04 LAB — BASIC METABOLIC PANEL
BUN: 12 mg/dL (ref 6–23)
CO2: 29 mEq/L (ref 19–32)
Calcium: 9.4 mg/dL (ref 8.4–10.5)
Glucose, Bld: 153 mg/dL — ABNORMAL HIGH (ref 70–99)

## 2012-12-04 NOTE — Progress Notes (Deleted)
Name: [name] [name]    DOB: 05-Mar-1933  Age: 79 y.o.  MR#: [id number]       PCP:  Rene Paci, MD      Insurance: Payor: Cleatrice Burke MEDICARE / Plan: AARP MEDICARE COMPLETE / Product Type: *No Product type* /   CC:   No chief complaint on file. NO LIST  VS Filed Vitals:   12/04/12 1532  BP: 140/68  Pulse: 76  Height: 5\' 10"  (1.778 m)  Weight: 263 lb 6.4 oz (119.477 kg)    Weights Current Weight  12/04/12 263 lb 6.4 oz (119.477 kg)  11/05/12 266 lb 12 oz (120.997 kg)  10/31/12 262 lb (118.842 kg)    Blood Pressure  BP Readings from Last 3 Encounters:  12/04/12 140/68  11/05/12 170/78  10/31/12 128/80     Admit date:  (Not on file) Last encounter with RMR:  11/05/2012   Allergy Penicillins  Current Outpatient Prescriptions  Medication Sig Dispense Refill  . ALPRAZolam (XANAX) 0.5 MG tablet Take 1 tablet (0.5 mg total) by mouth 2 (two) times daily as needed for sleep or anxiety.  180 tablet  0  . amLODipine-benazepril (LOTREL) 10-20 MG per capsule Take 1 capsule by mouth daily.  90 capsule  3  . aspirin EC 81 MG tablet Take 81 mg by mouth daily.      . chlorthalidone (HYGROTON) 25 MG tablet Take 1 tablet (25 mg total) by mouth daily.  90 tablet  3  . furosemide (LASIX) 40 MG tablet Take 40 mg by mouth as needed. Fluid retention      . gabapentin (NEURONTIN) 300 MG capsule Take 1 capsule (300 mg total) by mouth 3 (three) times daily.  90 capsule  1  . glimepiride (AMARYL) 2 MG tablet TAKE 1 TABLET DAILY IN THE MORNING FOR DIABETES  90 tablet  1  . HYDROcodone-acetaminophen (NORCO/VICODIN) 5-325 MG per tablet TAKE  (1)  TABLET  EVERY EIGHT HOURS AS NEEDED.  90 tablet  0  . insulin NPH (HUMULIN N,NOVOLIN N) 100 UNIT/ML injection Inject 22 Units into the skin 2 (two) times daily before a meal.  3 vial  5  . Lancets MISC 1 each daily.       Marland Kitchen LEVOXYL 200 MCG tablet TAKE 1 TABLET DAILY  90 tablet  2  . LEVOXYL 25 MCG tablet TAKE 1 TABLET DAILY WITH TAB  90 tablet   2  . LOTRIMIN AF 1 % cream APPLY TOPICALLY 2 TIMES A DAY  1 Tube  3  . meclizine (ANTIVERT) 25 MG tablet TAKE 1 TABLET DAILY AS NEEDED FOR DIZZINESS  30 tablet  1  . metoprolol succinate (TOPROL-XL) 50 MG 24 hr tablet Take 1 tablet (50 mg total) by mouth daily. Take with or immediately following a meal.  90 tablet  3  . Multiple Vitamin (MULTIVITAMIN) tablet Take 1 tablet by mouth daily.      . nitroGLYCERIN (NITROLINGUAL) 0.4 MG/SPRAY spray Place 1 spray under the tongue every 5 (five) minutes as needed.  12 g  3  . nystatin-triamcinolone (MYCOLOG II) cream APPLY TO AFFECTED AREA 3 TIMES A DAY FOR 1 WEEK  60 g  0  . potassium chloride SA (K-DUR,KLOR-CON) 20 MEQ tablet Take 20 mEq by mouth as needed. Take only when lasix is needed      . pravastatin (PRAVACHOL) 80 MG tablet Take 1 tablet (80 mg total) by mouth at bedtime. cholesterol  90 tablet  3  . PRILOSEC  20 MG capsule TAKE 1 CAPSULE ONCE A DAY FOR STOMACH  90 capsule  2  . triamcinolone cream (KENALOG) 0.1 % APPLY TO AFFECTED SKIN TWICE A DAY AS NEEDED FOR ITCH  453.6 g  0   No current facility-administered medications for this visit.    Discontinued Meds:   There are no discontinued medications.  Patient Active Problem List   Diagnosis Date Noted  . Insomnia 04/06/2011  . Sinoatrial node dysfunction   . Gastroesophageal reflux disease   . Diabetes mellitus, type II   . ANXIETY 12/30/2009  . PERIPHERAL NEUROPATHY 12/26/2009  . VITAMIN B12 DEFICIENCY 10/11/2009  . Obesity 09/16/2009  . Arteriosclerotic cardiovascular disease (ASCVD) 09/15/2009  . CEREBROVASCULAR DISEASE 09/15/2009  . Hyperlipidemia 08/29/2009  . Hypertension 08/29/2009  . OSTEOARTHRITIS, KNEES, BILATERAL 08/29/2009  . COLONIC POLYPS, ADENOMATOUS, HX OF 02/22/2009  . Hypothyroid 02/17/2009  . Chronic obstructive pulmonary disease 02/17/2009  . Chronic diastolic heart failure 12/15/2008  . PPM-Medtronic 12/15/2008    LABS    Component Value Date/Time   NA  135 06/20/2012 0830   NA 138 12/04/2010 0825   NA 138 07/03/2010 2103   K 4.8 06/20/2012 0830   K 5.1 12/04/2010 0825   K 4.2 07/03/2010 2103   CL 103 06/20/2012 0830   CL 102 12/04/2010 0825   CL 99 07/03/2010 2103   CO2 28 06/20/2012 0830   CO2 26 12/04/2010 0825   CO2 29 07/03/2010 2103   GLUCOSE 146* 06/20/2012 0830   GLUCOSE 111* 12/04/2010 0825   GLUCOSE 174* 07/03/2010 2103   BUN 16 06/20/2012 0830   BUN 12 12/04/2010 0825   BUN 12 07/03/2010 2103   CREATININE 0.95 06/20/2012 0830   CREATININE 1.05 12/04/2010 0825   CREATININE 0.80 07/03/2010 2103   CREATININE 0.9 03/17/2010 1107   CREATININE 0.92 12/25/2009 2356   CALCIUM 10.2 06/20/2012 0830   CALCIUM 9.8 12/04/2010 0825   CALCIUM 9.1 07/03/2010 2103   GFRNONAA 60* 12/25/2009 2356   GFRNONAA 55* 12/18/2009 0820   GFRNONAA >60 12/11/2009 0535   GFRAA  Value: >60        The eGFR has been calculated using the MDRD equation. This calculation has not been validated in all clinical situations. eGFR's persistently <60 mL/min signify possible Chronic Kidney Disease. 12/25/2009 2356   GFRAA  Value: >60        The eGFR has been calculated using the MDRD equation. This calculation has not been validated in all clinical situations. eGFR's persistently <60 mL/min signify possible Chronic Kidney Disease. 12/18/2009 0820   GFRAA  Value: >60        The eGFR has been calculated using the MDRD equation. This calculation has not been validated in all clinical situations. eGFR's persistently <60 mL/min signify possible Chronic Kidney Disease. 12/11/2009 0535   CMP     Component Value Date/Time   NA 135 06/20/2012 0830   K 4.8 06/20/2012 0830   CL 103 06/20/2012 0830   CO2 28 06/20/2012 0830   GLUCOSE 146* 06/20/2012 0830   BUN 16 06/20/2012 0830   CREATININE 0.95 06/20/2012 0830   CREATININE 0.80 07/03/2010 2103   CALCIUM 10.2 06/20/2012 0830   PROT 7.0 06/20/2012 0830   ALBUMIN 4.2 06/20/2012 0830   AST 23 06/20/2012 0830   ALT 21 06/20/2012 0830   ALKPHOS 81 06/20/2012 0830    BILITOT 0.7 06/20/2012 0830   GFRNONAA 60* 12/25/2009 2356   GFRAA  Value: >60  The eGFR has been calculated using the MDRD equation. This calculation has not been validated in all clinical situations. eGFR's persistently <60 mL/min signify possible Chronic Kidney Disease. 12/25/2009 2356       Component Value Date/Time   WBC 9.1 06/20/2012 0830   WBC 6.5 12/04/2010 0825   WBC 7.0 12/30/2009 1148   HGB 14.2 06/20/2012 0830   HGB 14.1 12/04/2010 0825   HGB 13.7 12/30/2009 1148   HCT 41.6 06/20/2012 0830   HCT 43.1 12/04/2010 0825   HCT 39.7 12/30/2009 1148   MCV 87.8 06/20/2012 0830   MCV 87.1 12/04/2010 0825   MCV 91.3 12/30/2009 1148    Lipid Panel     Component Value Date/Time   CHOL 162 10/22/2011 1151   TRIG 159.0* 10/22/2011 1151   HDL 48.80 10/22/2011 1151   CHOLHDL 3 10/22/2011 1151   VLDL 31.8 10/22/2011 1151   LDLCALC 81 10/22/2011 1151    ABG No results found for this basename: phart, pco2, pco2art, po2, po2art, hco3, tco2, acidbasedef, o2sat     Lab Results  Component Value Date   TSH 0.69 01/24/2012   BNP (last 3 results) No results found for this basename: PROBNP,  in the last 8760 hours Cardiac Panel (last 3 results) No results found for this basename: CKTOTAL, CKMB, TROPONINI, RELINDX,  in the last 72 hours  Iron/TIBC/Ferritin No results found for this basename: iron, tibc, ferritin     EKG Orders placed in visit on 05/23/12  . EKG 12-LEAD     Prior Assessment and Plan Problem List as of 12/04/2012   Hypothyroid   Last Assessment & Plan   07/30/2012 Office Visit Written 07/30/2012  3:12 PM by Newt Lukes, MD      Difficult with medication splitting - but otherwise doing well Check lab today - adjust as needed Lab Results  Component Value Date   TSH 0.69 01/24/2012      VITAMIN B12 DEFICIENCY   Hyperlipidemia   Last Assessment & Plan   07/30/2012 Office Visit Written 07/30/2012  3:39 PM by Newt Lukes, MD     Statin increase spring 2012 -  doing well Recheck annually     Obesity   Last Assessment & Plan   04/05/2011 Office Visit Written 04/06/2011  8:34 AM by Kathlen Brunswick, MD     Obesity has been a chronic problem.  At least weight is stable since previous visit 4 months ago.  Patient has been unable to restrict calories adequately to lose a significant amount of weight.    ANXIETY   PERIPHERAL NEUROPATHY   Last Assessment & Plan   11/05/2012 Office Visit Written 11/11/2012  8:30 AM by Kathlen Brunswick, MD     Symptoms of peripheral neuropathy persists. Dose of Neurontin will be increased.    Hypertension   Last Assessment & Plan   11/05/2012 Office Visit Written 11/11/2012  8:29 AM by Kathlen Brunswick, MD     Blood pressure control has been excellent for the past year, but systolic is elevated at today's visit. Chlorthalidone will be added to her medical regime.    Chronic diastolic heart failure   Last Assessment & Plan   11/05/2012 Office Visit Edited 11/11/2012  7:17 PM by Kathlen Brunswick, MD     Chlorthalidone will be added for better control of edema and hypertension. Furosemide will be available for use on an as-needed basis.    Arteriosclerotic cardiovascular disease (ASCVD)   Last Assessment &  Plan   11/05/2012 Office Visit Written 11/11/2012  7:16 PM by Kathlen Brunswick, MD     No symptoms at present suggesting progression of coronary disease or active myocardial ischemia. We will continue to attempt to optimize control of cardiovascular risk factors.    CEREBROVASCULAR DISEASE   Last Assessment & Plan   11/05/2012 Office Visit Written 11/11/2012  8:27 AM by Kathlen Brunswick, MD     Followed by Dr. Excell Seltzer with moderately severe bilateral disease.    Chronic obstructive pulmonary disease   OSTEOARTHRITIS, KNEES, BILATERAL   COLONIC POLYPS, ADENOMATOUS, HX OF   PPM-Medtronic   Last Assessment & Plan   12/03/2011 Office Visit Written 12/03/2011  4:44 PM by Marinus Maw, MD     Her device is working normally.  There is longevity approximately 9 years. We'll recheck in several months.    Sinoatrial node dysfunction   Last Assessment & Plan   04/05/2011 Office Visit Edited 04/06/2011  8:35 AM by Kathlen Brunswick, MD     Recent generator change.  Future appointment with Dr. Ladona Ridgel will be verified.    Gastroesophageal reflux disease   Last Assessment & Plan   11/05/2012 Office Visit Written 11/11/2012  8:28 AM by Kathlen Brunswick, MD     GI symptoms now adequately controlled. Simethicone will be used in the future as needed. Prilosec dosage will be decreased to 20 mg per day.    Diabetes mellitus, type II   Last Assessment & Plan   07/30/2012 Office Visit Written 07/30/2012  3:12 PM by Newt Lukes, MD      The current medical regimen is effective;  continue present plan and medications. On ASA, ACEI, statin Lab Results  Component Value Date   HGBA1C 6.8* 01/24/2012      Insomnia   Last Assessment & Plan   04/05/2011 Office Visit Written 04/06/2011  8:38 AM by Kathlen Brunswick, MD     Difficulty falling and staying asleep; History of positive sleep study for obstructive sleep apnea, but patient does not tolerate positive pressure device.  Treatment with benzodiazepines has not been effective and is not desirable in this older woman.  Dr. Felicity Coyer may wish to consider referral to a sleep specialist.        Imaging: No results found.

## 2012-12-04 NOTE — Assessment & Plan Note (Signed)
No symptoms at present to suggest fluid overload or other manifestations of congestive heart failure.

## 2012-12-04 NOTE — Progress Notes (Signed)
Patient ID: [name] [name], female   DOB: June 16, 1932, 79 y.o.   MRN: [id number]  HPI: Scheduled return visit for reassessment of GI problems and management of coronary artery disease plus multiple cardiovascular risk factors. Patient reports continuing abdominal bloating and discomfort with eructation, but this has improved with Gas-X. Symptoms are of recent origin, within the past 2 months or so. She has not previously required evaluation by a gastroenterologist. She is free of cardiovascular symptoms.  Current Outpatient Prescriptions  Medication Sig Dispense Refill  . ALPRAZolam (XANAX) 0.5 MG tablet Take 1 tablet (0.5 mg total) by mouth 2 (two) times daily as needed for sleep or anxiety.  180 tablet  0  . amLODipine-benazepril (LOTREL) 10-20 MG per capsule Take 1 capsule by mouth daily.  90 capsule  3  . aspirin EC 81 MG tablet Take 81 mg by mouth daily.      . chlorthalidone (HYGROTON) 25 MG tablet Take 1 tablet (25 mg total) by mouth daily.  90 tablet  3  . furosemide (LASIX) 40 MG tablet Take 40 mg by mouth as needed. Fluid retention      . gabapentin (NEURONTIN) 300 MG capsule Take 1 capsule (300 mg total) by mouth 3 (three) times daily.  90 capsule  1  . glimepiride (AMARYL) 2 MG tablet TAKE 1 TABLET DAILY IN THE MORNING FOR DIABETES  90 tablet  1  . HYDROcodone-acetaminophen (NORCO/VICODIN) 5-325 MG per tablet TAKE  (1)  TABLET  EVERY EIGHT HOURS AS NEEDED.  90 tablet  0  . insulin NPH (HUMULIN N,NOVOLIN N) 100 UNIT/ML injection Inject 22 Units into the skin 2 (two) times daily before a meal.  3 vial  5  . Lancets MISC 1 each daily.       Marland Kitchen LEVOXYL 200 MCG tablet TAKE 1 TABLET DAILY  90 tablet  2  . LEVOXYL 25 MCG tablet TAKE 1 TABLET DAILY WITH TAB  90 tablet  2  . LOTRIMIN AF 1 % cream APPLY TOPICALLY 2 TIMES A DAY  1 Tube  3  . meclizine (ANTIVERT) 25 MG tablet TAKE 1 TABLET DAILY AS NEEDED FOR DIZZINESS  30 tablet  1  . metoprolol succinate (TOPROL-XL) 50 MG 24 hr tablet Take 1  tablet (50 mg total) by mouth daily. Take with or immediately following a meal.  90 tablet  3  . Multiple Vitamin (MULTIVITAMIN) tablet Take 1 tablet by mouth daily.      . nitroGLYCERIN (NITROLINGUAL) 0.4 MG/SPRAY spray Place 1 spray under the tongue every 5 (five) minutes as needed.  12 g  3  . nystatin-triamcinolone (MYCOLOG II) cream APPLY TO AFFECTED AREA 3 TIMES A DAY FOR 1 WEEK  60 g  0  . potassium chloride SA (K-DUR,KLOR-CON) 20 MEQ tablet Take 20 mEq by mouth as needed. Take only when lasix is needed      . pravastatin (PRAVACHOL) 80 MG tablet Take 1 tablet (80 mg total) by mouth at bedtime. cholesterol  90 tablet  3  . PRILOSEC 20 MG capsule TAKE 1 CAPSULE ONCE A DAY FOR STOMACH  90 capsule  2  . triamcinolone cream (KENALOG) 0.1 % APPLY TO AFFECTED SKIN TWICE A DAY AS NEEDED FOR ITCH  453.6 g  0   No current facility-administered medications for this visit.   Allergies  Allergen Reactions  . Penicillins      Past medical history, social history, and family history reviewed and updated.  ROS: Denies chest pain, dyspnea,  lightheadedness or syncope. She has had no melena, hematemesis, emesis, diarrhea or constipation. All other systems reviewed and are negative.  PHYSICAL EXAM: BP 140/68  Pulse 76  Ht 5\' 10"  (1.778 m)  Wt 119.477 kg (263 lb 6.4 oz)  BMI 37.79 kg/m2;  Body mass index is 37.79 kg/(m^2). General-Well developed; no acute distress Body habitus-moderately overweight Neck-No JVD; soft bilateral carotid bruits Lungs-clear lung fields; resonant to percussion Cardiovascular-normal PMI; normal S1 and S2 Abdomen-normal bowel sounds; soft and non-tender without masses or organomegaly Musculoskeletal-No deformities, no cyanosis or clubbing Neurologic-Normal cranial nerves; symmetric strength and tone Skin-Warm, no significant lesions Extremities-distal pulses intact; no edema  [place], MD 12/04/2012  4:02 PM  ASSESSMENT AND PLAN

## 2012-12-04 NOTE — Assessment & Plan Note (Signed)
Return visit with Dr. Ladona Ridgel anticipated within the next month.

## 2012-12-04 NOTE — Assessment & Plan Note (Signed)
Stable course since moderate three-vessel coronary disease identified 15 years ago.  We will continue our attempts to optimize control of cardiovascular risk factors.

## 2012-12-04 NOTE — Assessment & Plan Note (Signed)
Lipid profile and other basic laboratory testing will be performed today, as patient failed to show for her appointment 3 days ago.

## 2012-12-04 NOTE — Assessment & Plan Note (Signed)
Blood pressure control improved with modification of medical regime.  She will collect additional determinations at local pharmacies and at [facility] prior to her next office visit.

## 2012-12-04 NOTE — Assessment & Plan Note (Signed)
Symptoms improved with Gas-X and a PPI, but patient reports continuing discomfort. Evaluation by gastroenterology would likely be helpful. Ms. Allie Dimmer would like to discuss this with Dr. Felicity Coyer at her next office visit.

## 2012-12-04 NOTE — Patient Instructions (Addendum)
Your physician recommends that you schedule a follow-up appointment in: 6 MONTHS WITH DR K  Your physician recommends that you return for lab work in: TODAY (SLIPS GIVEN FOR BMET)  Your physician has requested that you regularly monitor and record your blood pressure readings at home. Please use the same machine at the same time of day to check your readings and record them to bring to your follow-up visit. AT THE LOCAL DRUG[facility]ORE/[facility]

## 2012-12-05 ENCOUNTER — Encounter: Payer: Self-pay | Admitting: *Deleted

## 2012-12-05 ENCOUNTER — Encounter: Payer: Self-pay | Admitting: Cardiology

## 2012-12-05 NOTE — Addendum Note (Signed)
Addended by: Thompson Grayer on: 12/05/2012 12:02 PM   Modules accepted: Orders

## 2012-12-15 ENCOUNTER — Other Ambulatory Visit: Payer: Self-pay | Admitting: Internal Medicine

## 2012-12-15 ENCOUNTER — Other Ambulatory Visit: Payer: Self-pay | Admitting: *Deleted

## 2012-12-15 ENCOUNTER — Ambulatory Visit (HOSPITAL_COMMUNITY): Payer: Medicare Other

## 2012-12-16 ENCOUNTER — Other Ambulatory Visit: Payer: Self-pay | Admitting: Internal Medicine

## 2012-12-18 ENCOUNTER — Ambulatory Visit (HOSPITAL_COMMUNITY)
Admission: RE | Admit: 2012-12-18 | Discharge: 2012-12-18 | Disposition: A | Discharge status: Home / Self Care | Payer: Medicare Other | Source: Ambulatory Visit | Attending: Cardiovascular Disease | Admitting: Cardiovascular Disease

## 2012-12-18 DIAGNOSIS — I658 Occlusion and stenosis of other precerebral arteries: Secondary | ICD-10-CM | POA: Insufficient documentation

## 2012-12-18 DIAGNOSIS — I6529 Occlusion and stenosis of unspecified carotid artery: Secondary | ICD-10-CM | POA: Insufficient documentation

## 2012-12-18 DIAGNOSIS — I6523 Occlusion and stenosis of bilateral carotid arteries: Secondary | ICD-10-CM

## 2012-12-23 ENCOUNTER — Ambulatory Visit (INDEPENDENT_AMBULATORY_CARE_PROVIDER_SITE_OTHER): Payer: Medicare Other | Admitting: Internal Medicine

## 2012-12-23 ENCOUNTER — Encounter: Payer: Self-pay | Admitting: Internal Medicine

## 2012-12-23 ENCOUNTER — Other Ambulatory Visit (INDEPENDENT_AMBULATORY_CARE_PROVIDER_SITE_OTHER): Payer: Medicare Other

## 2012-12-23 ENCOUNTER — Telehealth: Payer: Self-pay | Admitting: Internal Medicine

## 2012-12-23 VITALS — BP 120/64 | HR 86 | Temp 97.7°F | Wt 257.8 lb

## 2012-12-23 DIAGNOSIS — R300 Dysuria: Secondary | ICD-10-CM

## 2012-12-23 DIAGNOSIS — E039 Hypothyroidism, unspecified: Secondary | ICD-10-CM

## 2012-12-23 DIAGNOSIS — N3281 Overactive bladder: Secondary | ICD-10-CM | POA: Insufficient documentation

## 2012-12-23 DIAGNOSIS — E119 Type 2 diabetes mellitus without complications: Secondary | ICD-10-CM

## 2012-12-23 DIAGNOSIS — N318 Other neuromuscular dysfunction of bladder: Secondary | ICD-10-CM

## 2012-12-23 LAB — URINALYSIS, ROUTINE W REFLEX MICROSCOPIC
Bilirubin Urine: NEGATIVE
Ketones, ur: NEGATIVE
Nitrite: NEGATIVE
Specific Gravity, Urine: <=1.005 (ref 1.000–1.030)
Urobilinogen, UA: 0.2 (ref 0.0–1.0)

## 2012-12-23 LAB — HEMOGLOBIN A1C: Hgb A1c MFr Bld: 8.2 % — ABNORMAL HIGH (ref 4.6–6.5)

## 2012-12-23 LAB — MICROALBUMIN / CREATININE URINE RATIO: Microalb Creat Ratio: 1.8 mg/g (ref 0.0–30.0)

## 2012-12-23 LAB — TSH: TSH: 0.6 u[IU]/mL (ref 0.35–5.50)

## 2012-12-23 MED ORDER — SULFAMETHOXAZOLE-TRIMETHOPRIM 800-160 MG PO TABS: 1.0000 | ORAL_TABLET | Freq: Two times a day (BID) | ORAL | Status: DC

## 2012-12-23 NOTE — Assessment & Plan Note (Signed)
Resume vesicare trial at higher dose - erx done

## 2012-12-23 NOTE — Telephone Encounter (Signed)
Request to speak to office manager

## 2012-12-23 NOTE — Patient Instructions (Signed)
It was good to see you today. Medications reviewed, increase Vesicare to 2x/day for bladder control and take spetra antibiotics 2x/day x 1 week for bladder infection Other medications updated -no other changes at this time. Test(s) ordered today. Your results will be released to MyChart (or called to you) after review, usually within 72hours after test completion. If any changes need to be made, you will be notified at that same time. Please schedule followup in 6 months for diabetes check, call sooner if problems.

## 2012-12-23 NOTE — Progress Notes (Signed)
  Subjective:    Patient ID: [name] [name], female    DOB: Aug 31, 1932, 79 y.o.   MRN: [id number]  HPI  Here for follow up -reviewed chronic medical issues:  Past Medical History  Diagnosis Date  . Arteriosclerotic cardiovascular disease (ASCVD)     Cath in 9/99-70% mid LAD with diffuse distal disease, 70% T1, 60% mid circumflex, 50% mid RCA, normal ejection fraction; negative stress nuclear in 07/2008  . Sinoatrial node dysfunction 2003    Medtronic dual-chamber device  . Cerebrovascular disease     carotid bruits; no focal disease in 1999, 2002 and 2006  . ANXIETY   . Diastolic CHF, chronic     Normal EF  . COPD   . Hyperlipidemia   . Hypertension   . Hypothyroidism   . Gastroesophageal reflux disease     With hiatal hernia; irritable bowel syndrome; H. pylori-treated; Colonoscopy 2008: non-specific colitis, IH, Diverticulosis, Rectal ulcer secondary to ASA  . OSTEOARTHRITIS, KNEES, BILATERAL     Bilateral TKA  . VITAMIN B12 DEFICIENCY   . Carpal tunnel syndrome   . Peripheral neuropathy   . Diabetes mellitus     Insulin treatment  . Pacemaker   . Closed left fibular fracture 11/30/11    Review of Systems  Cardiovascular: Negative for chest pain.  Gastrointestinal: Positive for constipation. Negative for vomiting and abdominal pain.  Endocrine: Positive for polyuria (?). Negative for polydipsia.  Genitourinary: Positive for frequency. Negative for dysuria, flank pain, difficulty urinating and pelvic pain.  Neurological: Negative for weakness and headaches.       Objective:   Physical Exam  BP 120/64  Pulse 86  Temp(Src) 97.7 F (36.5 C) (Oral)  Wt 257 lb 12.8 oz (116.937 kg)  BMI 36.99 kg/m2  SpO2 94% Wt Readings from Last 3 Encounters:  12/23/12 257 lb 12.8 oz (116.937 kg)  12/04/12 263 lb 6.4 oz (119.477 kg)  11/05/12 266 lb 12 oz (120.997 kg)   Constitutional: She is obese; appears well-developed and well-nourished. No distress.  Neck: Normal range of  motion. Neck supple. No JVD present. No thyromegaly present.  Cardiovascular: Normal rate, regular rhythm and normal heart sounds.  No murmur heard. No BLE edema. Pulmonary/Chest: Effort normal and breath sounds normal. No respiratory distress. She has no wheezes.  Psychiatric: She has a normal mood and affect. Her behavior is normal. Judgment and thought content normal.      Lab Results  Component Value Date   WBC 9.1 06/20/2012   HGB 14.2 06/20/2012   HCT 41.6 06/20/2012   PLT 261 06/20/2012   CHOL 162 10/22/2011   TRIG 159.0* 10/22/2011   HDL 48.80 10/22/2011   ALT 21 06/20/2012   AST 23 06/20/2012   NA 130* 12/04/2012   K 3.9 12/04/2012   CL 91* 12/04/2012   CREATININE 0.88 12/04/2012   BUN 12 12/04/2012   CO2 29 12/04/2012   TSH 0.69 01/24/2012   INR 1.07 12/04/2010   HGBA1C 7.4* 07/30/2012   MICROALBUR 0.8 07/23/2011    Assessment & Plan:  See problem list. Medications and labs reviewed today.  Dysuria - hx recurrent UTI- unable to provide UA for eval today - t empirically with septra x 7 d - erx done

## 2012-12-23 NOTE — Assessment & Plan Note (Signed)
Difficult with medication splitting - but otherwise doing well Check lab today - adjust as needed Lab Results  Component Value Date   TSH 0.69 01/24/2012

## 2012-12-23 NOTE — Assessment & Plan Note (Signed)
The current medical regimen is effective;  continue present plan and medications. On ASA, ACEI, statin Check a1c and microalb  Lab Results  Component Value Date   HGBA1C 7.4* 07/30/2012

## 2012-12-24 ENCOUNTER — Telehealth: Payer: Self-pay | Admitting: *Deleted

## 2012-12-24 NOTE — Telephone Encounter (Signed)
PT CALLING FOR ULTRASOUND RESULTS

## 2012-12-29 NOTE — Telephone Encounter (Signed)
.  left message to have patient return my call. Concerning carotid duplex resulted by Dr Excell Seltzer on 12-24-12 as stable

## 2012-12-30 ENCOUNTER — Other Ambulatory Visit: Payer: Self-pay | Admitting: Internal Medicine

## 2012-12-31 NOTE — Telephone Encounter (Signed)
Spoke to pt to advise results/instructions. Pt understood.  

## 2013-01-12 ENCOUNTER — Other Ambulatory Visit: Payer: Self-pay | Admitting: *Deleted

## 2013-01-12 MED ORDER — HYDROCODONE-ACETAMINOPHEN 5-325 MG PO TABS: ORAL_TABLET | ORAL | Status: DC

## 2013-01-12 NOTE — Telephone Encounter (Signed)
Faxed script back to [facility].../lmb 

## 2013-01-13 ENCOUNTER — Encounter (HOSPITAL_COMMUNITY): Payer: Self-pay | Admitting: Internal Medicine

## 2013-01-28 ENCOUNTER — Ambulatory Visit: Payer: Medicare Other | Admitting: Internal Medicine

## 2013-01-28 ENCOUNTER — Other Ambulatory Visit: Payer: Self-pay | Admitting: *Deleted

## 2013-01-28 DIAGNOSIS — Z0289 Encounter for other administrative examinations: Secondary | ICD-10-CM

## 2013-01-28 MED ORDER — HYDROCODONE-ACETAMINOPHEN 5-325 MG PO TABS: ORAL_TABLET | ORAL | Status: DC

## 2013-01-29 NOTE — Telephone Encounter (Signed)
Faxed script back to [facility].../lmb 

## 2013-02-11 ENCOUNTER — Ambulatory Visit (INDEPENDENT_AMBULATORY_CARE_PROVIDER_SITE_OTHER): Payer: Medicare Other

## 2013-02-11 ENCOUNTER — Ambulatory Visit (INDEPENDENT_AMBULATORY_CARE_PROVIDER_SITE_OTHER): Payer: Medicare Other | Admitting: General Practice

## 2013-02-11 VITALS — BP 158/85 | HR 67 | Temp 97.1°F

## 2013-02-11 DIAGNOSIS — N390 Urinary tract infection, site not specified: Secondary | ICD-10-CM

## 2013-02-11 DIAGNOSIS — R300 Dysuria: Secondary | ICD-10-CM

## 2013-02-11 DIAGNOSIS — R309 Painful micturition, unspecified: Secondary | ICD-10-CM

## 2013-02-11 DIAGNOSIS — R350 Frequency of micturition: Secondary | ICD-10-CM

## 2013-02-11 DIAGNOSIS — R0602 Shortness of breath: Secondary | ICD-10-CM

## 2013-02-11 DIAGNOSIS — IMO0001 Reserved for inherently not codable concepts without codable children: Secondary | ICD-10-CM

## 2013-02-11 LAB — POCT URINALYSIS DIPSTICK
Bilirubin, UA: NEGATIVE
Glucose, UA: 250
Nitrite, UA: NEGATIVE
Urobilinogen, UA: NEGATIVE
pH, UA: 7

## 2013-02-11 LAB — POCT UA - MICROSCOPIC ONLY: Crystals, Ur, HPF, POC: NEGATIVE

## 2013-02-11 MED ORDER — CIPROFLOXACIN HCL 500 MG PO TABS: 500.0000 mg | ORAL_TABLET | Freq: Two times a day (BID) | ORAL | Status: DC

## 2013-02-11 NOTE — Progress Notes (Signed)
Subjective:    Patient ID: [name] [name], female    DOB: 09-27-1932, 79 y.o.   MRN: [id number]  Shortness of Breath This is a new problem. The current episode started 1 to 4 weeks ago. The problem occurs constantly. The problem has been gradually worsening. Associated symptoms include a fever, orthopnea, rhinorrhea, sputum production and wheezing. Pertinent negatives include no abdominal pain, chest pain, headaches, leg pain, leg swelling, sore throat or vomiting. The symptoms are aggravated by any activity. The patient has no known risk factors for DVT/PE. She has tried ipratropium inhalers for the symptoms. There is no history of asthma or COPD. (Emphysema)  Extremity Weakness  Associated symptoms include a fever. emphysema      Review of Systems  Constitutional: Positive for fever and chills.  HENT: Positive for congestion, rhinorrhea, sinus pressure and tinnitus. Negative for sore throat.   Eyes: Negative for pain.  Respiratory: Positive for sputum production, shortness of breath and wheezing.   Cardiovascular: Positive for orthopnea. Negative for chest pain and leg swelling.  Gastrointestinal: Positive for nausea. Negative for vomiting and abdominal pain.  Musculoskeletal: Positive for extremity weakness.  Neurological: Positive for dizziness and weakness. Negative for headaches.       Objective:   Physical Exam  Constitutional: She is oriented to person, place, and time. She appears well-developed and well-nourished.  Cardiovascular: Normal rate, regular rhythm and normal heart sounds.   Pulmonary/Chest: No respiratory distress. She exhibits no tenderness.  Shortness of breath on exertion. Diminished breath sounds in bases  Abdominal: Soft. There is tenderness in the suprapubic area. There is no CVA tenderness.  Neurological: She is alert and oriented to person, place, and time.  Skin: Skin is warm and dry.  Psychiatric: She has a normal mood and affect.    Results for  orders placed in visit on 02/11/13  POCT UA - MICROSCOPIC ONLY      Result Value Range   WBC, Ur, HPF, POC occ     RBC, urine, microscopic 1-5     Bacteria, U Microscopic occ     Mucus, UA mod     Epithelial cells, urine per micros occ     Crystals, Ur, HPF, POC neg     Casts, Ur, LPF, POC occ     Yeast, UA neg    POCT URINALYSIS DIPSTICK      Result Value Range   Color, UA yellow     Clarity, UA clear     Glucose, UA 250     Bilirubin, UA neg     Ketones, UA neg     Spec Grav, UA 1.010     Blood, UA trace     pH, UA 7.0     Protein, UA trace     Urobilinogen, UA negative     Nitrite, UA neg     Leukocytes, UA Negative     [facility] reading (PRIMARY) by Coralie Keens, FNP-C, no pneumonia or pneumothorax noted.                                      Assessment & Plan:  1. Shortness of breath - DG Chest 2 View; Future  2. Frequency - POCT UA - Microscopic Only - POCT urinalysis dipstick  3. Painful urination - POCT UA - Microscopic Only - POCT urinalysis dipstick  4. UTI (urinary tract infection) - ciprofloxacin (CIPRO) 500 MG  tablet; Take 1 tablet (500 mg total) by mouth 2 (two) times daily.  Dispense: 20 tablet; Refill: 0 -Increase fluid intake AZO over the counter X2 days Frequent voiding Proper perineal hygiene Take prescribed inhalers as instructed RTO if symptoms worsen or no improvement Patient verbalized understanding Coralie Keens, FNP-C

## 2013-02-11 NOTE — Patient Instructions (Addendum)

## 2013-02-20 ENCOUNTER — Ambulatory Visit (INDEPENDENT_AMBULATORY_CARE_PROVIDER_SITE_OTHER): Payer: Medicare Other

## 2013-02-20 ENCOUNTER — Encounter: Payer: Self-pay | Admitting: General Practice

## 2013-02-20 ENCOUNTER — Ambulatory Visit (INDEPENDENT_AMBULATORY_CARE_PROVIDER_SITE_OTHER): Payer: Medicare Other | Admitting: General Practice

## 2013-02-20 VITALS — BP 156/77 | HR 88 | Temp 97.0°F | Ht 70.0 in | Wt 266.0 lb

## 2013-02-20 DIAGNOSIS — F411 Generalized anxiety disorder: Secondary | ICD-10-CM

## 2013-02-20 DIAGNOSIS — M25572 Pain in left ankle and joints of left foot: Secondary | ICD-10-CM

## 2013-02-20 DIAGNOSIS — E119 Type 2 diabetes mellitus without complications: Secondary | ICD-10-CM

## 2013-02-20 DIAGNOSIS — M549 Dorsalgia, unspecified: Secondary | ICD-10-CM

## 2013-02-20 DIAGNOSIS — E785 Hyperlipidemia, unspecified: Secondary | ICD-10-CM

## 2013-02-20 DIAGNOSIS — R5383 Other fatigue: Secondary | ICD-10-CM

## 2013-02-20 DIAGNOSIS — E039 Hypothyroidism, unspecified: Secondary | ICD-10-CM

## 2013-02-20 DIAGNOSIS — R5381 Other malaise: Secondary | ICD-10-CM

## 2013-02-20 DIAGNOSIS — M25579 Pain in unspecified ankle and joints of unspecified foot: Secondary | ICD-10-CM

## 2013-02-20 DIAGNOSIS — I10 Essential (primary) hypertension: Secondary | ICD-10-CM

## 2013-02-20 DIAGNOSIS — G8929 Other chronic pain: Secondary | ICD-10-CM

## 2013-02-20 MED ORDER — ALPRAZOLAM 0.5 MG PO TABS: 0.5000 mg | ORAL_TABLET | Freq: Two times a day (BID) | ORAL | Status: DC | PRN

## 2013-02-20 MED ORDER — HYDROCODONE-ACETAMINOPHEN 5-325 MG PO TABS: ORAL_TABLET | ORAL | Status: DC

## 2013-02-20 NOTE — Progress Notes (Addendum)
  Subjective:    Patient ID: [name] [name], female    DOB: [birth year], 79 y.o.   MRN: [id number]  HPI Patient presents today to establish care. She was being seen by provider in [place], but husband's driving restrictions were changed and needs a provider within 40 miles radius. She has a history of anxiety, HTN, diabetes, neuropathy, hypothyroidism, gerd. She reports checking blood sugars at home twice daily and range 120's-170's. She reports taking medications as directed. She reports still having left ankle pain from a fibula fracture last year.     Review of Systems  Constitutional: Negative for fever and chills.  HENT: Negative for neck pain and neck stiffness.        Chronic neck pain, arthritis  Respiratory: Negative for chest tightness and shortness of breath.   Cardiovascular: Negative for chest pain and palpitations.  Gastrointestinal: Negative for vomiting, abdominal pain, diarrhea and blood in stool.  Genitourinary: Negative for hematuria and difficulty urinating.  Musculoskeletal: Positive for back pain.       Chronic back pain  Neurological: Negative for dizziness, weakness and headaches.       Objective:   Physical Exam  Constitutional: She is oriented to person, place, and time. She appears well-developed and well-nourished.  HENT:  Head: Normocephalic and atraumatic.  Right Ear: External ear normal.  Left Ear: External ear normal.  Mouth/Throat: Oropharynx is clear and moist.  Eyes: EOM are normal.  Neck: Normal range of motion. Neck supple.  Cardiovascular: Normal rate, regular rhythm and normal heart sounds.   Pulmonary/Chest: Effort normal and breath sounds normal.  Abdominal: Soft. Bowel sounds are normal. She exhibits no distension. There is no tenderness.  Neurological: She is alert and oriented to person, place, and time.  Skin: Skin is warm and dry.  Psychiatric: She has a normal mood and affect.   [facility] reading (PRIMARY) by .Coralie Keens, FNP-C,  no fracture of left noted.                                          Assessment & Plan:  1. Pain in joint, ankle and foot, left - DG Ankle Complete Left; Future  2. Diabetes - POCT glycosylated hemoglobin (Hb A1C)  3. Other and unspecified hyperlipidemia - NMR, lipoprofile  4. Unspecified hypothyroidism - Thyroid Panel With TSH  5. Other malaise and fatigue - Vitamin B12  6. Hypertension - POCT CBC - CMP14+EGFR  7. Chronic back pain - HYDROcodone-acetaminophen (NORCO/VICODIN) 5-325 MG per tablet; TAKE  (1)  TABLET  EVERY EIGHT HOURS AS NEEDED.  Dispense: 90 tablet; Refill: 0  8. Anxiety state, unspecified - ALPRAZolam (XANAX) 0.5 MG tablet; Take 1 tablet (0.5 mg total) by mouth 2 (two) times daily as needed for anxiety.  Dispense: 60 tablet; Refill: 0 -Continue all current medications Labs pending F/u in 3 months Discussed exercise and diet  Patient verbalized understanding Coralie Keens, FNP-C

## 2013-02-20 NOTE — Patient Instructions (Addendum)

## 2013-02-22 LAB — NMR, LIPOPROFILE
Cholesterol: 158 mg/dL (ref <200)
LDL Particle Number: 1263 nmol/L — ABNORMAL HIGH (ref <1000)
LDL Size: 21.1 nm (ref >20.5)
LP-IR Score: 47 — ABNORMAL HIGH (ref <=45)
Small LDL Particle Number: 405 nmol/L (ref <=527)

## 2013-02-22 LAB — CMP14+EGFR
Albumin/Globulin Ratio: 1.8 (ref 1.1–2.5)
Albumin: 4.4 g/dL (ref 3.5–4.8)
Alkaline Phosphatase: 84 IU/L (ref 39–117)
BUN/Creatinine Ratio: 13 (ref 11–26)
BUN: 10 mg/dL (ref 8–27)
Chloride: 96 mmol/L — ABNORMAL LOW (ref 97–108)
GFR calc Af Amer: 82 mL/min/{1.73_m2} (ref >59)
Potassium: 4.1 mmol/L (ref 3.5–5.2)
Total Bilirubin: 0.6 mg/dL (ref 0.0–1.2)

## 2013-02-22 LAB — THYROID PANEL WITH TSH: T3 Uptake Ratio: 29 % (ref 24–39)

## 2013-02-23 ENCOUNTER — Telehealth: Payer: Self-pay | Admitting: General Practice

## 2013-02-25 NOTE — Telephone Encounter (Signed)
Aware - in result note 

## 2013-03-07 ENCOUNTER — Other Ambulatory Visit: Payer: Self-pay | Admitting: *Deleted

## 2013-03-07 MED ORDER — POTASSIUM CHLORIDE CRYS ER 20 MEQ PO TBCR: 20.0000 meq | EXTENDED_RELEASE_TABLET | ORAL | Status: DC | PRN

## 2013-03-07 MED ORDER — ALBUTEROL SULFATE HFA 108 (90 BASE) MCG/ACT IN AERS: 2.0000 | INHALATION_SPRAY | Freq: Four times a day (QID) | RESPIRATORY_TRACT | Status: DC | PRN

## 2013-03-07 MED ORDER — GLIMEPIRIDE 2 MG PO TABS: ORAL_TABLET | ORAL | Status: DC

## 2013-03-07 MED ORDER — OMEPRAZOLE 20 MG PO CPDR: DELAYED_RELEASE_CAPSULE | ORAL | Status: DC

## 2013-03-07 MED ORDER — PRAVASTATIN SODIUM 80 MG PO TABS: 80.0000 mg | ORAL_TABLET | Freq: Every day | ORAL | Status: DC

## 2013-03-07 MED ORDER — LEVOTHYROXINE SODIUM 200 MCG PO TABS: ORAL_TABLET | ORAL | Status: DC

## 2013-03-07 MED ORDER — METOPROLOL SUCCINATE ER 50 MG PO TB24: 50.0000 mg | ORAL_TABLET | Freq: Every day | ORAL | Status: DC

## 2013-03-07 MED ORDER — CHLORTHALIDONE 25 MG PO TABS: 25.0000 mg | ORAL_TABLET | Freq: Every day | ORAL | Status: DC

## 2013-03-07 MED ORDER — AMLODIPINE BESY-BENAZEPRIL HCL 10-20 MG PO CAPS: 1.0000 | ORAL_CAPSULE | Freq: Every day | ORAL | Status: DC

## 2013-03-07 MED ORDER — LEVOTHYROXINE SODIUM 25 MCG PO TABS: ORAL_TABLET | ORAL | Status: DC

## 2013-03-09 ENCOUNTER — Other Ambulatory Visit: Payer: Self-pay

## 2013-03-09 ENCOUNTER — Telehealth: Payer: Self-pay | Admitting: General Practice

## 2013-03-09 MED ORDER — LEVOTHYROXINE SODIUM 200 MCG PO TABS: ORAL_TABLET | ORAL | Status: DC

## 2013-03-09 MED ORDER — PRAVASTATIN SODIUM 80 MG PO TABS: 80.0000 mg | ORAL_TABLET | Freq: Every day | ORAL | Status: DC

## 2013-03-09 MED ORDER — OMEPRAZOLE 20 MG PO CPDR: DELAYED_RELEASE_CAPSULE | ORAL | Status: DC

## 2013-03-09 MED ORDER — GLIMEPIRIDE 2 MG PO TABS: ORAL_TABLET | ORAL | Status: DC

## 2013-03-09 MED ORDER — METOPROLOL SUCCINATE ER 50 MG PO TB24: 50.0000 mg | ORAL_TABLET | Freq: Every day | ORAL | Status: DC

## 2013-03-09 MED ORDER — POTASSIUM CHLORIDE CRYS ER 20 MEQ PO TBCR: 20.0000 meq | EXTENDED_RELEASE_TABLET | ORAL | Status: DC | PRN

## 2013-03-09 MED ORDER — CHLORTHALIDONE 25 MG PO TABS: 25.0000 mg | ORAL_TABLET | Freq: Every day | ORAL | Status: DC

## 2013-03-09 MED ORDER — AMLODIPINE BESY-BENAZEPRIL HCL 10-20 MG PO CAPS: 1.0000 | ORAL_CAPSULE | Freq: Every day | ORAL | Status: DC

## 2013-03-09 MED ORDER — ALBUTEROL SULFATE HFA 108 (90 BASE) MCG/ACT IN AERS: 2.0000 | INHALATION_SPRAY | Freq: Four times a day (QID) | RESPIRATORY_TRACT | Status: DC | PRN

## 2013-03-09 MED ORDER — LEVOTHYROXINE SODIUM 25 MCG PO TABS: ORAL_TABLET | ORAL | Status: DC

## 2013-03-10 ENCOUNTER — Other Ambulatory Visit: Payer: Self-pay

## 2013-03-10 MED ORDER — NYSTATIN-TRIAMCINOLONE 100000-0.1 UNIT/GM-% EX CREA: TOPICAL_CREAM | Freq: Three times a day (TID) | CUTANEOUS | Status: DC

## 2013-03-10 MED ORDER — GLIMEPIRIDE 2 MG PO TABS: ORAL_TABLET | ORAL | Status: DC

## 2013-03-17 ENCOUNTER — Other Ambulatory Visit: Payer: Self-pay | Admitting: General Practice

## 2013-03-17 NOTE — Telephone Encounter (Signed)
Debbi called RX for three months at [facility]

## 2013-03-19 ENCOUNTER — Other Ambulatory Visit: Payer: Self-pay | Admitting: General Practice

## 2013-03-19 DIAGNOSIS — F411 Generalized anxiety disorder: Secondary | ICD-10-CM

## 2013-03-19 MED ORDER — ALPRAZOLAM 0.5 MG PO TABS: 0.5000 mg | ORAL_TABLET | Freq: Two times a day (BID) | ORAL | Status: DC | PRN

## 2013-03-19 NOTE — Telephone Encounter (Signed)
Last filled and seen 02/20/13, if approved both will print, have nurse call to pickup

## 2013-03-19 NOTE — Telephone Encounter (Signed)
Please call into pharmacy

## 2013-03-20 ENCOUNTER — Other Ambulatory Visit: Payer: Self-pay | Admitting: Internal Medicine

## 2013-03-20 NOTE — Telephone Encounter (Signed)
Pt aware, rx for xanax and hydrocodone ready.

## 2013-04-14 ENCOUNTER — Ambulatory Visit (INDEPENDENT_AMBULATORY_CARE_PROVIDER_SITE_OTHER): Payer: Medicare Other

## 2013-04-14 DIAGNOSIS — Z23 Encounter for immunization: Secondary | ICD-10-CM

## 2013-04-20 ENCOUNTER — Other Ambulatory Visit: Payer: Self-pay | Admitting: General Practice

## 2013-04-20 DIAGNOSIS — F411 Generalized anxiety disorder: Secondary | ICD-10-CM

## 2013-04-20 NOTE — Telephone Encounter (Signed)
Last seen 02/20/13  [name] [name]  If approved route to nurse and call into [facility]

## 2013-04-21 NOTE — Telephone Encounter (Signed)
Last seen 02/20/13, last filled 03/19/13. Route to nurse, call into [facility]

## 2013-04-22 ENCOUNTER — Other Ambulatory Visit: Payer: Self-pay | Admitting: General Practice

## 2013-04-22 DIAGNOSIS — F411 Generalized anxiety disorder: Secondary | ICD-10-CM

## 2013-04-22 MED ORDER — ALPRAZOLAM 0.5 MG PO TABS: 0.5000 mg | ORAL_TABLET | Freq: Two times a day (BID) | ORAL | Status: DC | PRN

## 2013-04-22 NOTE — Telephone Encounter (Signed)
Last filled 03/19/13, last seen 02/20/13. Route to nurse call into

## 2013-04-22 NOTE — Telephone Encounter (Signed)
Please call into pharmacy

## 2013-04-23 ENCOUNTER — Other Ambulatory Visit: Payer: Self-pay | Admitting: Family Medicine

## 2013-04-23 NOTE — Telephone Encounter (Signed)
Xanax called to [place] [facility]. 

## 2013-05-12 ENCOUNTER — Telehealth: Payer: Self-pay | Admitting: General Practice

## 2013-05-12 ENCOUNTER — Other Ambulatory Visit: Payer: Self-pay | Admitting: General Practice

## 2013-05-12 DIAGNOSIS — G8929 Other chronic pain: Secondary | ICD-10-CM

## 2013-05-12 MED ORDER — HYDROCODONE-ACETAMINOPHEN 5-325 MG PO TABS: 1.0000 | ORAL_TABLET | Freq: Two times a day (BID) | ORAL | Status: DC | PRN

## 2013-05-12 NOTE — Telephone Encounter (Signed)
Script ready for pick up 

## 2013-05-12 NOTE — Telephone Encounter (Signed)
Patient aware.

## 2013-05-22 ENCOUNTER — Ambulatory Visit: Payer: Medicare Other | Admitting: General Practice

## 2013-05-22 ENCOUNTER — Other Ambulatory Visit: Payer: Self-pay | Admitting: General Practice

## 2013-05-25 NOTE — Telephone Encounter (Signed)
Last seen 02/20/13  [name] [name]  If approved route to nurse to call  Into [facility]

## 2013-05-27 ENCOUNTER — Encounter: Payer: Self-pay | Admitting: Cardiology

## 2013-05-27 ENCOUNTER — Ambulatory Visit (INDEPENDENT_AMBULATORY_CARE_PROVIDER_SITE_OTHER): Payer: Medicare Other | Admitting: Cardiology

## 2013-05-27 VITALS — BP 122/66 | HR 68 | Ht 70.0 in | Wt 269.0 lb

## 2013-05-27 DIAGNOSIS — I2510 Atherosclerotic heart disease of native coronary artery without angina pectoris: Secondary | ICD-10-CM

## 2013-05-27 DIAGNOSIS — I5032 Chronic diastolic (congestive) heart failure: Secondary | ICD-10-CM

## 2013-05-27 DIAGNOSIS — Z950 Presence of cardiac pacemaker: Secondary | ICD-10-CM

## 2013-05-27 DIAGNOSIS — I495 Sick sinus syndrome: Secondary | ICD-10-CM

## 2013-05-27 DIAGNOSIS — I7090 Unspecified atherosclerosis: Secondary | ICD-10-CM

## 2013-05-27 DIAGNOSIS — I10 Essential (primary) hypertension: Secondary | ICD-10-CM

## 2013-05-27 NOTE — Progress Notes (Signed)
HPI The patient is a new patient for me having seen Dr. Dietrich Pates.  She has a history of coronary disease though she's not had any problems in many years. She has multiple cardiovascular risk factors. She also has a pacemaker. She gets around very slowly being limited by back and knee pain. With her husband she does little bit of dancing for exercise. With this level of activity she does not get chest pressure, neck or arm discomfort. She does not get PND or orthopnea. She does have chronic dyspnea with exertion but this has been stable. She has some lower extremity swelling which is relatively new. She's not noticing any palpitations, presyncope or syncope. She does complain of frequent urination at night but this also has been a chronic problem. She says she does drink fluids because her mouth is dry.  Allergies  Allergen Reactions  . Penicillins     Current Outpatient Prescriptions  Medication Sig Dispense Refill  . albuterol (PROVENTIL HFA;VENTOLIN HFA) 108 (90 BASE) MCG/ACT inhaler Inhale 2 puffs into the lungs every 6 (six) hours as needed for wheezing.  3 Inhaler  1  . ALPRAZolam (XANAX) 0.5 MG tablet TAKE 1 TABLET TWICE DAILY AS NEEDED FOR ANXIETY  60 tablet  0  . ALPRAZolam (XANAX) 0.5 MG tablet Take 1 tablet (0.5 mg total) by mouth 2 (two) times daily as needed for anxiety.  60 tablet  0  . amLODipine-benazepril (LOTREL) 10-20 MG per capsule Take 1 capsule by mouth daily.  90 capsule  1  . aspirin EC 81 MG tablet Take 81 mg by mouth daily.      . chlorthalidone (HYGROTON) 25 MG tablet Take 1 tablet (25 mg total) by mouth daily.  90 tablet  1  . ciprofloxacin (CIPRO) 500 MG tablet Take 1 tablet (500 mg total) by mouth 2 (two) times daily.  20 tablet  0  . furosemide (LASIX) 40 MG tablet Take 40 mg by mouth as needed. Fluid retention      . gabapentin (NEURONTIN) 300 MG capsule Take 1 capsule (300 mg total) by mouth 3 (three) times daily.  90 capsule  1  . glimepiride (AMARYL) 2 MG  tablet TAKE 1 TABLET IN THE MORNING FOR DIABETES  90 tablet  0  . glucose blood test strip       . HYDROcodone-acetaminophen (NORCO/VICODIN) 5-325 MG per tablet Take 1 tablet by mouth every 12 (twelve) hours as needed for moderate pain.  60 tablet  0  . insulin NPH (HUMULIN N,NOVOLIN N) 100 UNIT/ML injection Inject 22 Units into the skin 2 (two) times daily before a meal.  3 vial  5  . Lancets MISC 1 each daily.       Marland Kitchen levothyroxine (SYNTHROID, LEVOTHROID) 200 MCG tablet TAKE 1 TABLET DAILY  90 tablet  2  . levothyroxine (SYNTHROID, LEVOTHROID) 25 MCG tablet TAKE 1 TABLET DAILY WITH  90 tablet  2  . Lite Touch Lancets MISC       . LOTRIMIN AF 1 % cream APPLY TOPICALLY 2 TIMES A DAY  1 Tube  3  . meclizine (ANTIVERT) 25 MG tablet TAKE 1 TABLET DAILY AS NEEDED FOR DIZZINESS  30 tablet  1  . metoprolol succinate (TOPROL-XL) 50 MG 24 hr tablet Take 1 tablet (50 mg total) by mouth daily. Take with or immediately following a meal.  90 tablet  1  . Multiple Vitamin (MULTIVITAMIN) tablet Take 1 tablet by mouth daily.      Marland Kitchen  nitroGLYCERIN (NITROLINGUAL) 0.4 MG/SPRAY spray Place 1 spray under the tongue every 5 (five) minutes as needed.  12 g  3  . nystatin-triamcinolone (MYCOLOG II) cream APPLY TO AFFECTED AREA 3 TIMES A DAY  60 g  0  . omeprazole (PRILOSEC) 20 MG capsule TAKE 1 CAPSULE ONCE A DAY FOR STOMACH  90 capsule  1  . potassium chloride SA (K-DUR,KLOR-CON) 20 MEQ tablet Take 1 tablet (20 mEq total) by mouth as needed. Take only when lasix is needed  90 tablet  1  . pravastatin (PRAVACHOL) 80 MG tablet Take 1 tablet (80 mg total) by mouth at bedtime. cholesterol  90 tablet  1  . solifenacin (VESICARE) 5 MG tablet Take 1 tablet (5 mg total) by mouth 2 (two) times daily.  60 tablet  1  . triamcinolone cream (KENALOG) 0.1 % APPLY 2 TIMES A DAY AS NEEDED FOR ITCHING  454 g  0   No current facility-administered medications for this visit.    Past Medical History  Diagnosis Date  .  Arteriosclerotic cardiovascular disease (ASCVD)     Cath in 9/99-70% mid LAD with diffuse distal disease, 70% T1, 60% mid circumflex, 50% mid RCA, normal ejection fraction; negative stress nuclear in 07/2008  . Sinoatrial node dysfunction 2003    Medtronic dual-chamber device  . Cerebrovascular disease     carotid bruits; no focal disease in 1999, 2002 and 2006  . ANXIETY   . Diastolic CHF, chronic     Normal EF  . COPD   . Hyperlipidemia   . Hypertension   . Hypothyroidism   . Gastroesophageal reflux disease     With hiatal hernia; irritable bowel syndrome; H. pylori-treated; Colonoscopy 2008: non-specific colitis, IH, Diverticulosis, Rectal ulcer secondary to ASA  . OSTEOARTHRITIS, KNEES, BILATERAL     Bilateral TKA  . VITAMIN B12 DEFICIENCY   . Carpal tunnel syndrome   . Peripheral neuropathy   . Diabetes mellitus     Insulin treatment  . Pacemaker   . Closed left fibular fracture 11/30/11    Past Surgical History  Procedure Laterality Date  . Total knee arthroplasty      Bilateral  . Nasal fracture surgery    . Cholecystectomy    . Abdominal hysterectomy    . Hemorrhoid surgery    . Carpal tunnel release    . Thyroidectomy  1980    Goiter  . Bladder repair    . Cardiac pacemaker placement  2003    Medtronic, dual-chamber  . Colonoscopy  2008    ROS:  As stated in the HPI and negative for all other systems.  PHYSICAL EXAM BP 122/66  Pulse 68  Ht 5\' 10"  (1.778 m)  Wt 269 lb (122.018 kg)  BMI 38.60 kg/m2 GENERAL:  Well appearing HEENT:  Pupils equal round and reactive, fundi not visualized, oral mucosa unremarkable NECK:  No jugular venous distention, waveform within normal limits, carotid upstroke brisk and symmetric, no bruits, no thyromegaly LYMPHATICS:  No cervical, inguinal adenopathy LUNGS:  Clear to auscultation bilaterally BACK:  No CVA tenderness CHEST:  Well healed pacemaker scar. HEART:  PMI not displaced or sustained,S1 and S2 within normal limits,  no S3, no S4, no clicks, no rubs, no murmurs ABD:  Flat, positive bowel sounds normal in frequency in pitch, no bruits, no rebound, no guarding, no midline pulsatile mass, no hepatomegaly, no splenomegaly EXT:  2 plus pulses throughout, mild edema, no cyanosis no clubbing SKIN:  No rashes no nodules  NEURO:  Cranial nerves II through XII grossly intact, motor grossly intact throughout PSYCH:  Cognitively intact, oriented to person place and time   EKG:  AV paced rhythm, rate 68  05/27/2013   ASSESSMENT AND PLAN  CAD:  She has stable coronary disease. This point I don't think further cardiovascular testing is suggested. She will continue with risk reduction.  STATUS POST PACEMAKER:  I will arrange a followup pacemaker appointment as I think she's overdue.  HTN: The blood pressure is at target. No change in medications is indicated. We will continue with therapeutic lifestyle changes (TLC).  DYSLIPIDEMIA:  Her last cholesterol was not quite at target but this is followed closely by her primary providers. They suggested further changes to her diet rather than switching any medications at this point. I will defer to their management.  DIABETES:  Her hemoglobin A1c was 7.5. I agree that this should further be managed with some diet and weight loss.

## 2013-05-27 NOTE — Patient Instructions (Addendum)
The current medical regimen is effective;  continue present plan and medications.  Follow up in 1 year with Dr. Hochrein in [place].  You will receive a letter in the mail 2 months before you are due.  Please call us when you receive this letter to schedule your follow up appointment.  

## 2013-05-28 ENCOUNTER — Other Ambulatory Visit: Payer: Self-pay | Admitting: *Deleted

## 2013-05-28 DIAGNOSIS — F411 Generalized anxiety disorder: Secondary | ICD-10-CM

## 2013-05-28 MED ORDER — ALPRAZOLAM 0.5 MG PO TABS: 0.5000 mg | ORAL_TABLET | Freq: Two times a day (BID) | ORAL | Status: DC | PRN

## 2013-05-28 NOTE — Telephone Encounter (Signed)
Requesting refill on Xanax. If approved route to nurse to call in.  Patient has been out for two days and has tremors.

## 2013-05-28 NOTE — Telephone Encounter (Signed)
Called in.

## 2013-05-28 NOTE — Telephone Encounter (Signed)
Pl,ease call in xanax

## 2013-05-29 ENCOUNTER — Other Ambulatory Visit: Payer: Self-pay | Admitting: General Practice

## 2013-05-29 ENCOUNTER — Telehealth: Payer: Self-pay | Admitting: General Practice

## 2013-05-29 DIAGNOSIS — F411 Generalized anxiety disorder: Secondary | ICD-10-CM

## 2013-05-29 MED ORDER — ALPRAZOLAM 0.5 MG PO TABS: 0.5000 mg | ORAL_TABLET | Freq: Two times a day (BID) | ORAL | Status: DC | PRN

## 2013-05-29 NOTE — Telephone Encounter (Signed)
Xanax qty 60 , 2 refills called to [facility] pharmacy per M. Haliburton FNP.

## 2013-05-29 NOTE — Telephone Encounter (Signed)
GINA R. VERIFIED PT XANAX WAS CALLED INTO [place] [facility] 05/28/13. PT MADE AWARE

## 2013-06-22 ENCOUNTER — Other Ambulatory Visit: Payer: Self-pay | Admitting: General Practice

## 2013-06-22 ENCOUNTER — Encounter: Payer: Self-pay | Admitting: Nurse Practitioner

## 2013-06-22 ENCOUNTER — Ambulatory Visit (INDEPENDENT_AMBULATORY_CARE_PROVIDER_SITE_OTHER): Payer: Medicare Other | Admitting: Nurse Practitioner

## 2013-06-22 ENCOUNTER — Ambulatory Visit (INDEPENDENT_AMBULATORY_CARE_PROVIDER_SITE_OTHER): Payer: Medicare Other

## 2013-06-22 ENCOUNTER — Telehealth: Payer: Self-pay | Admitting: General Practice

## 2013-06-22 VITALS — BP 152/80 | HR 86 | Temp 96.8°F | Ht 70.0 in | Wt 260.0 lb

## 2013-06-22 DIAGNOSIS — G8929 Other chronic pain: Secondary | ICD-10-CM

## 2013-06-22 DIAGNOSIS — M549 Dorsalgia, unspecified: Secondary | ICD-10-CM

## 2013-06-22 DIAGNOSIS — R509 Fever, unspecified: Secondary | ICD-10-CM

## 2013-06-22 DIAGNOSIS — J181 Lobar pneumonia, unspecified organism: Secondary | ICD-10-CM

## 2013-06-22 DIAGNOSIS — J189 Pneumonia, unspecified organism: Secondary | ICD-10-CM

## 2013-06-22 DIAGNOSIS — R0989 Other specified symptoms and signs involving the circulatory and respiratory systems: Secondary | ICD-10-CM

## 2013-06-22 LAB — POCT INFLUENZA A/B
INFLUENZA A, POC: NEGATIVE
Influenza B, POC: NEGATIVE

## 2013-06-22 MED ORDER — HYDROCODONE-ACETAMINOPHEN 5-325 MG PO TABS: 1.0000 | ORAL_TABLET | Freq: Two times a day (BID) | ORAL | Status: DC | PRN

## 2013-06-22 MED ORDER — CLOTRIMAZOLE 1 % EX CREA: TOPICAL_CREAM | Freq: Two times a day (BID) | CUTANEOUS | Status: DC

## 2013-06-22 MED ORDER — PROMETHAZINE HCL 12.5 MG PO TABS: 12.5000 mg | ORAL_TABLET | Freq: Three times a day (TID) | ORAL | Status: DC | PRN

## 2013-06-22 MED ORDER — AZITHROMYCIN 250 MG PO TABS: ORAL_TABLET | ORAL | Status: DC

## 2013-06-22 MED ORDER — TRIAMCINOLONE ACETONIDE 0.1 % EX CREA: TOPICAL_CREAM | Freq: Two times a day (BID) | CUTANEOUS | Status: DC

## 2013-06-22 NOTE — Patient Instructions (Signed)

## 2013-06-22 NOTE — Telephone Encounter (Signed)
appt made

## 2013-06-22 NOTE — Progress Notes (Signed)
   Subjective:    Patient ID: [name] [name], female    DOB: Feb 15, 1933, 80 y.o.   MRN: [id number]  HPI  Patient in c/o cough and congestion- starting vomiting earlier today- No fever- has been feeling bad for over 1 week.    Review of Systems  Constitutional: Positive for fever. Negative for chills and appetite change.  HENT: Positive for congestion, rhinorrhea, sinus pressure and sore throat.   Respiratory: Positive for cough (productive).   Gastrointestinal: Positive for vomiting and diarrhea.  Genitourinary: Negative.   Musculoskeletal: Negative.   All other systems reviewed and are negative.       Objective:   Physical Exam  Constitutional: She is oriented to person, place, and time. She appears well-developed and well-nourished.  HENT:  Right Ear: Tympanic membrane, external ear and ear canal normal.  Left Ear: Hearing, tympanic membrane, external ear and ear canal normal.  Nose: Mucosal edema and rhinorrhea present. Right sinus exhibits no maxillary sinus tenderness and no frontal sinus tenderness. Left sinus exhibits no maxillary sinus tenderness and no frontal sinus tenderness.  Mouth/Throat: Uvula is midline, oropharynx is clear and moist and mucous membranes are normal.  Cardiovascular: Normal rate and normal heart sounds.   Pulmonary/Chest: Effort normal. She has wheezes (faint exp wheezes).  Diminished breath sounds left lower lobe  Musculoskeletal: Normal range of motion.  Neurological: She is alert and oriented to person, place, and time.  Skin: Skin is warm.  Psychiatric: She has a normal mood and affect. Her behavior is normal. Judgment and thought content normal.    BP 152/80  Pulse 86  Temp(Src) 96.8 F (36 C) (Oral)   Results for orders placed in visit on 06/22/13  POCT INFLUENZA A/B      Result Value Range   Influenza A, POC Negative     Influenza B, POC Negative     Chest x ray- left lower lobe infiltrate-Preliminary reading by Paulene FloorMary Benno Brensinger, FNP   [facility]     Assessment & Plan:   1. Chest congestion   2. Fever, unspecified   3. Left lower lobe pneumonia   4. Chronic back pain    Meds ordered this encounter  Medications  . HYDROcodone-acetaminophen (NORCO/VICODIN) 5-325 MG per tablet    Sig: Take 1 tablet by mouth every 12 (twelve) hours as needed for moderate pain.    Dispense:  60 tablet    Refill:  0    Order Specific Question:  Supervising Provider    Answer:  Ernestina PennaMOORE, DONALD W [[id number]]  . promethazine (PHENERGAN) 12.5 MG tablet    Sig: Take 1 tablet (12.5 mg total) by mouth every 8 (eight) hours as needed for nausea or vomiting.    Dispense:  20 tablet    Refill:  0    Order Specific Question:  Supervising Provider    Answer:  Ernestina PennaMOORE, DONALD W [[id number]]  . azithromycin (ZITHROMAX Z-PAK) 250 MG tablet    Sig: As directed    Dispense:  6 each    Refill:  0    Order Specific Question:  Supervising Provider    Answer:  Deborra MedinaMOORE, DONALD W [[id number]]   Force fluids  rest Avoid spicy and fatty foods Follow up in 2-3 days if no better Repeat chest x ray in 6 weeks Mary-Margaret Daphine DeutscherMartin, FNP

## 2013-06-25 ENCOUNTER — Telehealth: Payer: Self-pay | Admitting: Family Medicine

## 2013-06-25 NOTE — Telephone Encounter (Signed)
Message copied by Azalee CourseFULP, ASHLEY on Thu Jun 25, 2013 11:38 AM ------      Message from: Bennie PieriniMARTIN, MARY-MARGARET      Created: Tue Jun 23, 2013  8:15 AM       No pneumonia ------

## 2013-07-01 ENCOUNTER — Telehealth: Payer: Self-pay | Admitting: General Practice

## 2013-07-03 ENCOUNTER — Other Ambulatory Visit: Payer: Self-pay | Admitting: General Practice

## 2013-07-03 NOTE — Telephone Encounter (Signed)
Rx for xanax ,refill at pharmacy.

## 2013-07-03 NOTE — Telephone Encounter (Signed)
[facility]

## 2013-07-06 ENCOUNTER — Encounter: Payer: Medicare Other | Admitting: Internal Medicine

## 2013-07-06 ENCOUNTER — Other Ambulatory Visit: Payer: Self-pay | Admitting: General Practice

## 2013-07-07 ENCOUNTER — Ambulatory Visit (INDEPENDENT_AMBULATORY_CARE_PROVIDER_SITE_OTHER): Payer: Medicare Other

## 2013-07-07 ENCOUNTER — Ambulatory Visit (INDEPENDENT_AMBULATORY_CARE_PROVIDER_SITE_OTHER): Payer: Medicare Other | Admitting: General Practice

## 2013-07-07 ENCOUNTER — Encounter: Payer: Self-pay | Admitting: General Practice

## 2013-07-07 VITALS — BP 127/70 | HR 85 | Temp 97.1°F | Ht 70.0 in | Wt 272.0 lb

## 2013-07-07 DIAGNOSIS — M545 Low back pain, unspecified: Secondary | ICD-10-CM

## 2013-07-07 NOTE — Patient Instructions (Signed)
Back Pain, Adult Low back pain is very common. About 1 in 5 people have back pain.The cause of low back pain is rarely dangerous. The pain often gets better over time.About half of people with a sudden onset of back pain feel better in just 2 weeks. About 8 in 10 people feel better by 6 weeks.  CAUSES Some common causes of back pain include:  Strain of the muscles or ligaments supporting the spine.  Wear and tear (degeneration) of the spinal discs.  Arthritis.  Direct injury to the back. DIAGNOSIS Most of the time, the direct cause of low back pain is not known.However, back pain can be treated effectively even when the exact cause of the pain is unknown.Answering your caregiver's questions about your overall health and symptoms is one of the most accurate ways to make sure the cause of your pain is not dangerous. If your caregiver needs more information, he or she may order lab work or imaging tests (X-rays or MRIs).However, even if imaging tests show changes in your back, this usually does not require surgery. HOME CARE INSTRUCTIONS For many people, back pain returns.Since low back pain is rarely dangerous, it is often a condition that people can learn to manageon their own.   Remain active. It is stressful on the back to sit or stand in one place. Do not sit, drive, or stand in one place for more than 30 minutes at a time. Take short walks on level surfaces as soon as pain allows.Try to increase the length of time you walk each day.  Do not stay in bed.Resting more than 1 or 2 days can delay your recovery.  Do not avoid exercise or work.Your body is made to move.It is not dangerous to be active, even though your back may hurt.Your back will likely heal faster if you return to being active before your pain is gone.  Pay attention to your body when you bend and lift. Many people have less discomfortwhen lifting if they bend their knees, keep the load close to their bodies,and  avoid twisting. Often, the most comfortable positions are those that put less stress on your recovering back.  Find a comfortable position to sleep. Use a firm mattress and lie on your side with your knees slightly bent. If you lie on your back, put a pillow under your knees.  Only take over-the-counter or prescription medicines as directed by your caregiver. Over-the-counter medicines to reduce pain and inflammation are often the most helpful.Your caregiver may prescribe muscle relaxant drugs.These medicines help dull your pain so you can more quickly return to your normal activities and healthy exercise.  Put ice on the injured area.  Put ice in a plastic bag.  Place a towel between your skin and the bag.  Leave the ice on for 15-20 minutes, 03-04 times a day for the first 2 to 3 days. After that, ice and heat may be alternated to reduce pain and spasms.  Ask your caregiver about trying back exercises and gentle massage. This may be of some benefit.  Avoid feeling anxious or stressed.Stress increases muscle tension and can worsen back pain.It is important to recognize when you are anxious or stressed and learn ways to manage it.Exercise is a great option. SEEK MEDICAL CARE IF:  You have pain that is not relieved with rest or medicine.  You have pain that does not improve in 1 week.  You have new symptoms.  You are generally not feeling well. SEEK   IMMEDIATE MEDICAL CARE IF:   You have pain that radiates from your back into your legs.  You develop new bowel or bladder control problems.  You have unusual weakness or numbness in your arms or legs.  You develop nausea or vomiting.  You develop abdominal pain.  You feel faint. Document Released: 05/28/2005 Document Revised: 11/27/2011 Document Reviewed: 10/16/2010 Exit[employer] Patient Information 2014 [employer].  

## 2013-07-07 NOTE — Progress Notes (Signed)
   Subjective:    Patient ID: [name] [name] M Lascano, female    DOB: April 07, 1933, 80 y.o.   MRN: [id number]  Back Pain This is a new problem. The current episode started in the past 7 days. The problem occurs constantly. The problem is unchanged. The pain is present in the lumbar spine. The quality of the pain is described as aching and burning. The pain does not radiate. The pain is at a severity of 5/10. The symptoms are aggravated by bending, sitting, standing and twisting. Pertinent negatives include no abdominal pain, bladder incontinence, bowel incontinence, chest pain, dysuria, fever, headaches, pelvic pain or tingling. Risk factors include lack of exercise and sedentary lifestyle. She has tried analgesics and heat for the symptoms. The treatment provided mild relief.      Review of Systems  Constitutional: Negative for fever and chills.  Respiratory: Negative for chest tightness and shortness of breath.   Cardiovascular: Negative for chest pain and palpitations.  Gastrointestinal: Negative for abdominal pain and bowel incontinence.  Genitourinary: Negative for bladder incontinence, dysuria and pelvic pain.  Musculoskeletal: Positive for back pain.  Neurological: Negative for tingling and headaches.       Objective:   Physical Exam  Constitutional: She is oriented to person, place, and time. She appears well-developed and well-nourished.  Cardiovascular: Normal rate, regular rhythm and normal heart sounds.   Pulmonary/Chest: No respiratory distress. She has decreased breath sounds in the right upper field and the left upper field. She exhibits no tenderness.  Shortness of breath with exertion  Neurological: She is alert and oriented to person, place, and time.  Skin: Skin is warm and dry.  Psychiatric: She has a normal mood and affect.     [facility] reading (PRIMARY) by Coralie KeensMae E. Zaid Tomes, FNP-C, joint space narrowing.      Assessment & Plan:  1. Low back pain - DG Lumbar Spine Complete;  Future - Ambulatory referral to Orthopedic Surgery -continue taking pain medication as prescribed -RTO if symptoms worsen or unresolved -Patient verbalized understanding Coralie KeensMae E. Breea Loncar, FNP-C

## 2013-07-08 ENCOUNTER — Telehealth: Payer: Self-pay | Admitting: Orthopedic Surgery

## 2013-07-08 NOTE — Telephone Encounter (Signed)
[name] [name] is being [facility].  Please review the office notes and XR in EPIC and  Advise if OK to schedule here

## 2013-07-09 ENCOUNTER — Other Ambulatory Visit: Payer: Self-pay | Admitting: General Practice

## 2013-07-09 DIAGNOSIS — M549 Dorsalgia, unspecified: Secondary | ICD-10-CM

## 2013-07-09 NOTE — Telephone Encounter (Signed)
Spoke with Andrey Campanilearlyon /[facility], and advised of Dr. Romeo AppleHarrison reply.  Once MRI is done can schedule here

## 2013-07-09 NOTE — Telephone Encounter (Signed)
Call referring practice back and ask therm to get an MRI prior to the visit

## 2013-07-10 ENCOUNTER — Ambulatory Visit (INDEPENDENT_AMBULATORY_CARE_PROVIDER_SITE_OTHER): Payer: Medicare Other | Admitting: Internal Medicine

## 2013-07-10 ENCOUNTER — Encounter: Payer: Self-pay | Admitting: Internal Medicine

## 2013-07-10 VITALS — BP 155/74 | HR 82 | Ht 70.0 in | Wt 264.0 lb

## 2013-07-10 DIAGNOSIS — Z950 Presence of cardiac pacemaker: Secondary | ICD-10-CM

## 2013-07-10 DIAGNOSIS — I442 Atrioventricular block, complete: Secondary | ICD-10-CM

## 2013-07-10 DIAGNOSIS — I10 Essential (primary) hypertension: Secondary | ICD-10-CM

## 2013-07-10 DIAGNOSIS — I5032 Chronic diastolic (congestive) heart failure: Secondary | ICD-10-CM

## 2013-07-10 LAB — MDC_IDC_ENUM_SESS_TYPE_INCLINIC
Battery Remaining Longevity: 92 mo
Battery Voltage: 2.78 V
Brady Statistic AP VP Percent: 83 %
Brady Statistic AS VP Percent: 17 %
Brady Statistic AS VS Percent: 0 %
Lead Channel Impedance Value: 455 Ohm
Lead Channel Pacing Threshold Amplitude: 0.5 V
Lead Channel Sensing Intrinsic Amplitude: 4 mV
Lead Channel Setting Pacing Amplitude: 2 V
Lead Channel Setting Pacing Pulse Width: 0.4 ms
Lead Channel Setting Sensing Sensitivity: 2.8 mV
MDC IDC MSMT BATTERY IMPEDANCE: 250 Ohm
MDC IDC MSMT LEADCHNL RA PACING THRESHOLD AMPLITUDE: 0.5 V
MDC IDC MSMT LEADCHNL RA PACING THRESHOLD PULSEWIDTH: 0.4 ms
MDC IDC MSMT LEADCHNL RV IMPEDANCE VALUE: 653 Ohm
MDC IDC MSMT LEADCHNL RV PACING THRESHOLD PULSEWIDTH: 0.4 ms
MDC IDC SESS DTM: 20150130115122
MDC IDC SET LEADCHNL RV PACING AMPLITUDE: 2.5 V
MDC IDC STAT BRADY AP VS PERCENT: 0 %

## 2013-07-10 NOTE — Assessment & Plan Note (Signed)
Her systolic blood pressure is slightly elevated today. She notes that at home it is better controlled. I've encouraged the patient to maintain a low-sodium diet, and continue weight loss.

## 2013-07-10 NOTE — Patient Instructions (Signed)
Your physician recommends that you schedule a follow-up appointment in:  12 months with Dr Ladona Ridgelaylor and 6 months with Alisia FerrariPaula You will receive a reminder letter two months in advance reminding you to call and schedule your appointment. If you don't receive this letter, please contact our office.  Your physician recommends that you continue on your current medications as directed. Please refer to the Current Medication list given to you today.

## 2013-07-10 NOTE — Progress Notes (Signed)
HPI [name] returns today for followup. She is a very pleasant 80 year old woman with a history of multiple medical problems including symptomatic bradycardia status post permanent pacemaker insertion.  She denies syncope, chest pain, but does have peripheral edema. She denies syncope. Her main complaint today is related to her back and arthritis.  Allergies  Allergen Reactions  . Penicillins      Current Outpatient Prescriptions  Medication Sig Dispense Refill  . albuterol (PROVENTIL HFA;VENTOLIN HFA) 108 (90 BASE) MCG/ACT inhaler Inhale 2 puffs into the lungs every 6 (six) hours as needed for wheezing.  3 Inhaler  1  . ALPRAZolam (XANAX) 0.5 MG tablet Take 1 tablet (0.5 mg total) by mouth 2 (two) times daily as needed for anxiety.  60 tablet  2  . amLODipine-benazepril (LOTREL) 10-20 MG per capsule Take 1 capsule by mouth daily.  90 capsule  1  . aspirin EC 81 MG tablet Take 81 mg by mouth daily.      . chlorthalidone (HYGROTON) 25 MG tablet Take 1 tablet (25 mg total) by mouth daily.  90 tablet  1  . furosemide (LASIX) 40 MG tablet Take 40 mg by mouth as needed. Fluid retention      . gabapentin (NEURONTIN) 300 MG capsule Take 1 capsule (300 mg total) by mouth 3 (three) times daily.  90 capsule  1  . glimepiride (AMARYL) 2 MG tablet TAKE 1 TABLET IN THE MORNING FOR DIABETES  90 tablet  0  . HYDROcodone-acetaminophen (NORCO/VICODIN) 5-325 MG per tablet Take 1 tablet by mouth every 12 (twelve) hours as needed for moderate pain.  60 tablet  0  . insulin NPH (HUMULIN N,NOVOLIN N) 100 UNIT/ML injection Inject 22 Units into the skin 2 (two) times daily before a meal.  3 vial  5  . levothyroxine (SYNTHROID, LEVOTHROID) 200 MCG tablet TAKE 1 TABLET DAILY  90 tablet  2  . levothyroxine (SYNTHROID, LEVOTHROID) 25 MCG tablet TAKE 1 TABLET DAILY WITH  90 tablet  2  . meclizine (ANTIVERT) 25 MG tablet TAKE 1 TABLET DAILY AS NEEDED FOR DIZZINESS  30 tablet  1  . metoprolol succinate  (TOPROL-XL) 50 MG 24 hr tablet Take 1 tablet (50 mg total) by mouth daily. Take with or immediately following a meal.  90 tablet  1  . Multiple Vitamin (MULTIVITAMIN) tablet Take 1 tablet by mouth daily.      . nitroGLYCERIN (NITROLINGUAL) 0.4 MG/SPRAY spray Place 1 spray under the tongue every 5 (five) minutes as needed.  12 g  3  . nystatin-triamcinolone (MYCOLOG II) cream APPLY TO AFFECTED AREA 3 TIMES A DAY  60 g  2  . omeprazole (PRILOSEC) 20 MG capsule TAKE 1 CAPSULE ONCE A DAY FOR STOMACH  90 capsule  1  . potassium chloride SA (K-DUR,KLOR-CON) 20 MEQ tablet Take 1 tablet (20 mEq total) by mouth as needed. Take only when lasix is needed  90 tablet  1  . pravastatin (PRAVACHOL) 80 MG tablet Take 1 tablet (80 mg total) by mouth at bedtime. cholesterol  90 tablet  1  . solifenacin (VESICARE) 5 MG tablet Take 1 tablet (5 mg total) by mouth 2 (two) times daily.  60 tablet  1  . triamcinolone cream (KENALOG) 0.1 % Apply topically 2 (two) times daily.  454 g  0  . clotrimazole (LOTRIMIN AF) 1 % cream Apply topically 2 (two) times daily.  40 g  3   No current facility-administered medications for this visit.  Past Medical History  Diagnosis Date  . Arteriosclerotic cardiovascular disease (ASCVD)     Cath in 9/99-70% mid LAD with diffuse distal disease, 70% T1, 60% mid circumflex, 50% mid RCA, normal ejection fraction; negative stress nuclear in 07/2008  . Sinoatrial node dysfunction 2003    Medtronic dual-chamber device  . Cerebrovascular disease     carotid bruits; no focal disease in 1999, 2002 and 2006  . ANXIETY   . Diastolic CHF, chronic     Normal EF  . COPD   . Hyperlipidemia   . Hypertension   . Hypothyroidism   . Gastroesophageal reflux disease     With hiatal hernia; irritable bowel syndrome; H. pylori-treated; Colonoscopy 2008: non-specific colitis, IH, Diverticulosis, Rectal ulcer secondary to ASA  . OSTEOARTHRITIS, KNEES, BILATERAL     Bilateral TKA  . VITAMIN B12  DEFICIENCY   . Carpal tunnel syndrome   . Peripheral neuropathy   . Diabetes mellitus     Insulin treatment  . Pacemaker   . Closed left fibular fracture 11/30/11    ROS:   All systems reviewed and negative except as noted in the HPI.   Past Surgical History  Procedure Laterality Date  . Total knee arthroplasty      Bilateral  . Nasal fracture surgery    . Cholecystectomy    . Abdominal hysterectomy    . Hemorrhoid surgery    . Carpal tunnel release    . Thyroidectomy  1980    Goiter  . Bladder repair    . Cardiac pacemaker placement  2003    Medtronic, dual-chamber  . Colonoscopy  2008     Family History  Problem Relation Age of Onset  . COPD Father   . Heart failure Mother   . Cancer Sister     colon  . Cancer Brother     lung     History   Social History  . Marital Status: Married    Spouse Name: N/A    Number of Children: N/A  . Years of Education: 8   Occupational History  .     Social History Main Topics  . Smoking status: Never Smoker   . Smokeless tobacco: Not on file     Comment: Retired- lives with spouse (who has dementia). Pt has 4 children  . Alcohol Use: No  . Drug Use: No  . Sexual Activity: Not Currently   Other Topics Concern  . Not on file   Social History Narrative  . No narrative on file     BP 155/74  Pulse 82  Ht 5\' 10"  (1.778 m)  Wt 264 lb (119.75 kg)  BMI 37.88 kg/m2  Physical Exam:  Obese appearing elderly woman, NAD HEENT: Unremarkable Neck:  6 cm JVD, no thyromegally Lungs:  Clear with no wheezes, rales, or rhonchi. Well-healed pacemaker incision HEART:  Regular rate rhythm, no murmurs, no rubs, no clicks Abd:  soft, positive bowel sounds, no organomegally, no rebound, no guarding Ext:  2 plus pulses, trace peripheral edema, no cyanosis, no clubbing Skin:  No rashes no nodules Neuro:  CN II through XII intact, motor grossly intact  DEVICE  Normal device function.  See PaceArt for details.    Assess/Plan:

## 2013-07-10 NOTE — Assessment & Plan Note (Signed)
Her Medtronic dual-chamber pacemaker is working normally. We'll plan to recheck in several months. 

## 2013-07-10 NOTE — Assessment & Plan Note (Signed)
Her heart failure symptoms are well compensated class II. No change in medical therapy. She is encouraged to maintain a low-sodium diet, and lose weight.

## 2013-07-22 ENCOUNTER — Other Ambulatory Visit: Payer: Self-pay | Admitting: Family Medicine

## 2013-07-22 ENCOUNTER — Other Ambulatory Visit: Payer: Self-pay | Admitting: Cardiology

## 2013-07-22 ENCOUNTER — Other Ambulatory Visit: Payer: Self-pay | Admitting: Nurse Practitioner

## 2013-07-23 NOTE — Telephone Encounter (Signed)
Last seen 07/07/13, last filled 06/22/13. Rx will print

## 2013-07-24 ENCOUNTER — Other Ambulatory Visit: Payer: Self-pay | Admitting: *Deleted

## 2013-07-24 MED ORDER — GABAPENTIN 300 MG PO CAPS: 300.0000 mg | ORAL_CAPSULE | Freq: Three times a day (TID) | ORAL | Status: DC

## 2013-07-27 ENCOUNTER — Other Ambulatory Visit: Payer: Self-pay | Admitting: Family Medicine

## 2013-07-27 ENCOUNTER — Other Ambulatory Visit: Payer: Self-pay

## 2013-07-27 NOTE — Telephone Encounter (Signed)
Last seen 07/07/13 Mae  Last glucose 02/20/13  Wants 90 day supply

## 2013-07-27 NOTE — Telephone Encounter (Signed)
Aware, rx for hydrocodone ready. 

## 2013-07-28 MED ORDER — GLIMEPIRIDE 2 MG PO TABS
ORAL_TABLET | ORAL | Status: DC
Start: ? — End: 2013-09-14

## 2013-08-24 ENCOUNTER — Ambulatory Visit: Payer: Medicare Other | Admitting: General Practice

## 2013-09-08 ENCOUNTER — Other Ambulatory Visit: Payer: Self-pay | Admitting: Nurse Practitioner

## 2013-09-09 NOTE — Telephone Encounter (Signed)
Patient last seen in office on 07-07-13. Rx last filled on 07-27-13. Please advise. If approved please print and route to Pool B so nurse can call patient to pick up

## 2013-09-10 ENCOUNTER — Encounter: Payer: Self-pay | Admitting: Orthopedic Surgery

## 2013-09-10 ENCOUNTER — Ambulatory Visit: Payer: Medicare Other | Admitting: Orthopedic Surgery

## 2013-09-14 ENCOUNTER — Encounter: Payer: Self-pay | Admitting: General Practice

## 2013-09-14 ENCOUNTER — Ambulatory Visit (INDEPENDENT_AMBULATORY_CARE_PROVIDER_SITE_OTHER): Payer: Medicare Other | Admitting: General Practice

## 2013-09-14 VITALS — BP 155/79 | HR 73 | Temp 95.5°F | Ht 70.0 in | Wt 263.4 lb

## 2013-09-14 DIAGNOSIS — E876 Hypokalemia: Secondary | ICD-10-CM

## 2013-09-14 DIAGNOSIS — K219 Gastro-esophageal reflux disease without esophagitis: Secondary | ICD-10-CM

## 2013-09-14 DIAGNOSIS — G629 Polyneuropathy, unspecified: Secondary | ICD-10-CM

## 2013-09-14 DIAGNOSIS — E109 Type 1 diabetes mellitus without complications: Secondary | ICD-10-CM

## 2013-09-14 DIAGNOSIS — E785 Hyperlipidemia, unspecified: Secondary | ICD-10-CM

## 2013-09-14 DIAGNOSIS — I10 Essential (primary) hypertension: Secondary | ICD-10-CM

## 2013-09-14 DIAGNOSIS — E039 Hypothyroidism, unspecified: Secondary | ICD-10-CM

## 2013-09-14 DIAGNOSIS — G589 Mononeuropathy, unspecified: Secondary | ICD-10-CM

## 2013-09-14 LAB — POCT GLYCOSYLATED HEMOGLOBIN (HGB A1C): Hemoglobin A1C: 6.7

## 2013-09-14 MED ORDER — GABAPENTIN 300 MG PO CAPS: 300.0000 mg | ORAL_CAPSULE | Freq: Three times a day (TID) | ORAL | Status: DC

## 2013-09-14 MED ORDER — METOPROLOL SUCCINATE ER 50 MG PO TB24: 50.0000 mg | ORAL_TABLET | Freq: Every day | ORAL | Status: DC

## 2013-09-14 MED ORDER — OMEPRAZOLE 20 MG PO CPDR: DELAYED_RELEASE_CAPSULE | ORAL | Status: DC

## 2013-09-14 MED ORDER — AMLODIPINE BESY-BENAZEPRIL HCL 10-20 MG PO CAPS: 1.0000 | ORAL_CAPSULE | Freq: Every day | ORAL | Status: DC

## 2013-09-14 MED ORDER — GLIMEPIRIDE 2 MG PO TABS: ORAL_TABLET | ORAL | Status: DC

## 2013-09-14 MED ORDER — ALBUTEROL SULFATE HFA 108 (90 BASE) MCG/ACT IN AERS: 2.0000 | INHALATION_SPRAY | Freq: Four times a day (QID) | RESPIRATORY_TRACT | Status: DC | PRN

## 2013-09-14 MED ORDER — POTASSIUM CHLORIDE CRYS ER 20 MEQ PO TBCR: 20.0000 meq | EXTENDED_RELEASE_TABLET | ORAL | Status: DC | PRN

## 2013-09-14 MED ORDER — PRAVASTATIN SODIUM 80 MG PO TABS: ORAL_TABLET | ORAL | Status: DC

## 2013-09-14 NOTE — Progress Notes (Signed)
   Subjective:    Patient ID: [name] [name], female    DOB: 1932/06/15, 80 y.o.   MRN: [id number]  HPI Patient presents today for chronic health follow up. History of HLD, hypokalemia, gerd, htn, hypothyroidism, peripheral neuropathy, chronic back pain and diabetes type 1. Taking medications as prescribed. Denies regular exercise or eating healthy.     Review of Systems  Constitutional: Negative for fever and chills.  Respiratory: Negative for chest tightness and shortness of breath.   Cardiovascular: Negative for chest pain and palpitations.  Gastrointestinal: Negative for nausea, vomiting, abdominal pain, diarrhea, constipation and blood in stool.  Genitourinary: Negative for difficulty urinating.  Neurological: Negative for dizziness, weakness and headaches.       Objective:   Physical Exam  Constitutional: She is oriented to person, place, and time. She appears well-developed and well-nourished.  obese  HENT:  Head: Normocephalic and atraumatic.  Right Ear: External ear normal.  Left Ear: External ear normal.  Mouth/Throat: Oropharynx is clear and moist.  Eyes: Pupils are equal, round, and reactive to light.  Neck: Normal range of motion. Neck supple. No thyromegaly present.  Cardiovascular: Normal rate, regular rhythm and normal heart sounds.   Pulmonary/Chest: Effort normal and breath sounds normal. No respiratory distress. She exhibits no tenderness.  Abdominal: Soft. Bowel sounds are normal. She exhibits no distension. There is no tenderness.  Lymphadenopathy:    She has no cervical adenopathy.  Neurological: She is alert and oriented to person, place, and time.  Skin: Skin is warm and dry.  Psychiatric: She has a normal mood and affect.          Assessment & Plan:  1. Hyperlipidemia  - pravastatin (PRAVACHOL) 80 MG tablet; TAKE 1 TABLET ONCE DAILY FOR CHOLESTEROL  Dispense: 90 tablet; Refill: 1 - Lipid panel  2. Hypothyroidism  - Thyroid Panel With TSH  3.  DM (diabetes mellitus), type 1  - glimepiride (AMARYL) 2 MG tablet; TAKE 1 TABLET IN THE MORNING FOR DIABETES  Dispense: 90 tablet; Refill: 0 - POCT glycosylated hemoglobin (Hb A1C)  4. Hypertension  - metoprolol succinate (TOPROL-XL) 50 MG 24 hr tablet; Take 1 tablet (50 mg total) by mouth daily. Take with or immediately following a meal.  Dispense: 90 tablet; Refill: 1 - amLODipine-benazepril (LOTREL) 10-20 MG per capsule; Take 1 capsule by mouth daily.  Dispense: 90 capsule; Refill: 1 - CMP14+EGFR  5. GERD (gastroesophageal reflux disease)  - omeprazole (PRILOSEC) 20 MG capsule; TAKE 1 CAPSULE ONCE A DAY FOR STOMACH  Dispense: 90 capsule; Refill: 1  6. Hypokalemia  - potassium chloride SA (K-DUR,KLOR-CON) 20 MEQ tablet; Take 1 tablet (20 mEq total) by mouth as needed. Take only when lasix is needed  Dispense: 90 tablet; Refill: 1  7. Neuropathy  - gabapentin (NEURONTIN) 300 MG capsule; Take 1 capsule (300 mg total) by mouth 3 (three) times daily.  Dispense: 90 capsule; Refill: 1 -Continue all current medications Labs pending F/u in 3 months Discussed benefits of regular exercise and healthy eating Patient verbalized understanding Erby Pian, FNP-C

## 2013-09-14 NOTE — Patient Instructions (Signed)

## 2013-09-15 LAB — CMP14+EGFR
ALT: 31 IU/L (ref 0–32)
AST: 34 IU/L (ref 0–40)
Albumin/Globulin Ratio: 1.6 (ref 1.1–2.5)
Albumin: 4.5 g/dL (ref 3.5–4.8)
Alkaline Phosphatase: 89 IU/L (ref 39–117)
BUN/Creatinine Ratio: 11 (ref 11–26)
BUN: 11 mg/dL (ref 8–27)
CHLORIDE: 97 mmol/L (ref 97–108)
CO2: 26 mmol/L (ref 18–29)
Calcium: 10.2 mg/dL (ref 8.7–10.3)
Creatinine, Ser: 0.99 mg/dL (ref 0.57–1.00)
GFR calc Af Amer: 63 mL/min/{1.73_m2} (ref >59)
GFR calc non Af Amer: 55 mL/min/{1.73_m2} — ABNORMAL LOW (ref >59)
Globulin, Total: 2.9 g/dL (ref 1.5–4.5)
Glucose: 69 mg/dL (ref 65–99)
Potassium: 5.1 mmol/L (ref 3.5–5.2)
SODIUM: 139 mmol/L (ref 134–144)
Total Bilirubin: 0.5 mg/dL (ref 0.0–1.2)
Total Protein: 7.4 g/dL (ref 6.0–8.5)

## 2013-09-15 LAB — LIPID PANEL
CHOL/HDL RATIO: 3.1 ratio (ref 0.0–4.4)
Cholesterol, Total: 167 mg/dL (ref 100–199)
HDL: 54 mg/dL (ref >39)
LDL Calculated: 86 mg/dL (ref 0–99)
TRIGLYCERIDES: 135 mg/dL (ref 0–149)
VLDL Cholesterol Cal: 27 mg/dL (ref 5–40)

## 2013-09-15 LAB — THYROID PANEL WITH TSH
FREE THYROXINE INDEX: 2.5 (ref 1.2–4.9)
T3 Uptake Ratio: 27 % (ref 24–39)
T4, Total: 9.4 ug/dL (ref 4.5–12.0)
TSH: 2 u[IU]/mL (ref 0.450–4.500)

## 2013-09-23 ENCOUNTER — Other Ambulatory Visit: Payer: Self-pay | Admitting: *Deleted

## 2013-09-23 NOTE — Telephone Encounter (Signed)
Pt requesting larger quantity since applying to legs and abdomen.

## 2013-09-25 ENCOUNTER — Other Ambulatory Visit: Payer: Self-pay | Admitting: *Deleted

## 2013-09-25 ENCOUNTER — Other Ambulatory Visit: Payer: Self-pay | Admitting: General Practice

## 2013-09-25 MED ORDER — NYSTATIN-TRIAMCINOLONE 100000-0.1 UNIT/GM-% EX CREA: TOPICAL_CREAM | CUTANEOUS | Status: DC

## 2013-09-25 NOTE — Telephone Encounter (Signed)
Per pharmacy fax patient is requesting larger quantity applying to legs and abdomen. Please advise. If approved [facility]

## 2013-09-25 NOTE — Telephone Encounter (Signed)
Larger quantity script sent to pharmacy

## 2013-10-07 ENCOUNTER — Other Ambulatory Visit: Payer: Self-pay | Admitting: Internal Medicine

## 2013-10-07 ENCOUNTER — Other Ambulatory Visit: Payer: Self-pay | Admitting: General Practice

## 2013-10-08 NOTE — Telephone Encounter (Signed)
Last filled 09/08/13, last seen 09/14/13. Route to pool if approved so it can be called [facility]

## 2013-10-09 ENCOUNTER — Other Ambulatory Visit: Payer: Self-pay | Admitting: General Practice

## 2013-10-09 DIAGNOSIS — F411 Generalized anxiety disorder: Secondary | ICD-10-CM

## 2013-10-09 MED ORDER — ALPRAZOLAM 0.5 MG PO TABS: 0.5000 mg | ORAL_TABLET | Freq: Two times a day (BID) | ORAL | Status: DC | PRN

## 2013-10-12 ENCOUNTER — Other Ambulatory Visit: Payer: Self-pay | Admitting: General Practice

## 2013-10-13 ENCOUNTER — Other Ambulatory Visit: Payer: Self-pay | Admitting: Nurse Practitioner

## 2013-10-13 MED ORDER — HYDROCODONE-ACETAMINOPHEN 5-325 MG PO TABS: ORAL_TABLET | ORAL | Status: DC

## 2013-10-13 NOTE — Telephone Encounter (Signed)
rx ready for pickup 

## 2013-10-13 NOTE — Telephone Encounter (Signed)
Regular patient of Mae. Last seen in office on 09-14-13. Last RF on 09-14-13 for #60. Please advise. If approved please route to Pool B so nurse can call patient to pick up

## 2013-10-14 NOTE — Telephone Encounter (Signed)
Patient aware that script is upfront for pickup

## 2013-10-20 ENCOUNTER — Other Ambulatory Visit: Payer: Self-pay | Admitting: Nurse Practitioner

## 2013-11-10 ENCOUNTER — Telehealth: Payer: Self-pay | Admitting: Nurse Practitioner

## 2013-11-10 MED ORDER — HYDROCODONE-ACETAMINOPHEN 5-325 MG PO TABS: ORAL_TABLET | ORAL | Status: DC

## 2013-11-10 NOTE — Telephone Encounter (Signed)
rx ready for pick up- ntbs for next refill 

## 2013-11-11 NOTE — Telephone Encounter (Signed)
Patient aware to pick up 

## 2013-12-03 ENCOUNTER — Other Ambulatory Visit: Payer: Self-pay | Admitting: General Practice

## 2013-12-03 ENCOUNTER — Other Ambulatory Visit: Payer: Self-pay | Admitting: Nurse Practitioner

## 2013-12-03 ENCOUNTER — Telehealth: Payer: Self-pay | Admitting: General Practice

## 2013-12-04 ENCOUNTER — Other Ambulatory Visit: Payer: Self-pay | Admitting: Family

## 2013-12-04 DIAGNOSIS — F411 Generalized anxiety disorder: Secondary | ICD-10-CM

## 2013-12-04 MED ORDER — HYDROCODONE-ACETAMINOPHEN 5-325 MG PO TABS: ORAL_TABLET | ORAL | Status: DC

## 2013-12-04 MED ORDER — ALPRAZOLAM 0.5 MG PO TABS: 0.5000 mg | ORAL_TABLET | Freq: Two times a day (BID) | ORAL | Status: DC | PRN

## 2013-12-04 NOTE — Telephone Encounter (Signed)
Lat seen 09/14/13  Mae  Last filled 10/09/13 with 1 RF  If approved print and route to nurse

## 2013-12-04 NOTE — Telephone Encounter (Signed)
Last seen 09/14/13  [name] [name]  Last filled 11/10/13 #60  If approved print and route to nurse

## 2013-12-04 NOTE — Telephone Encounter (Signed)
Scripts done by provider, [facility] and ready for pick up. Patient will come for them on July 1 when they can be filled.

## 2013-12-04 NOTE — Progress Notes (Signed)
PAtient aware 

## 2013-12-14 ENCOUNTER — Ambulatory Visit: Payer: Medicare Other | Admitting: General Practice

## 2013-12-18 ENCOUNTER — Telehealth: Payer: Self-pay | Admitting: Family Medicine

## 2013-12-18 NOTE — Telephone Encounter (Signed)
Told them to send form again

## 2013-12-30 ENCOUNTER — Other Ambulatory Visit: Payer: Self-pay | Admitting: Internal Medicine

## 2014-01-04 ENCOUNTER — Other Ambulatory Visit: Payer: Self-pay | Admitting: Internal Medicine

## 2014-01-04 ENCOUNTER — Other Ambulatory Visit: Payer: Self-pay | Admitting: Family Medicine

## 2014-01-04 ENCOUNTER — Other Ambulatory Visit: Payer: Self-pay | Admitting: Family

## 2014-01-04 ENCOUNTER — Other Ambulatory Visit: Payer: Self-pay | Admitting: General Practice

## 2014-01-05 NOTE — Telephone Encounter (Signed)
This may be refilled x1. The patient needs to make an appointment for future refills as she needs to be seen by provider before any further refills can be authorized

## 2014-01-05 NOTE — Telephone Encounter (Signed)
Patient of [name] [name]. Last seen in office on 09-14-13. Rx last filled on 12-07-13 for #60. Please advise. If approved please print and route to pool A so nurse can call patient to pick up

## 2014-01-11 ENCOUNTER — Telehealth: Payer: Self-pay | Admitting: Family

## 2014-01-11 ENCOUNTER — Ambulatory Visit (INDEPENDENT_AMBULATORY_CARE_PROVIDER_SITE_OTHER): Payer: Medicare Other | Admitting: Family Medicine

## 2014-01-11 ENCOUNTER — Encounter: Payer: Self-pay | Admitting: Family Medicine

## 2014-01-11 VITALS — BP 133/72 | HR 88 | Temp 97.8°F | Ht 70.0 in | Wt 271.0 lb

## 2014-01-11 DIAGNOSIS — B372 Candidiasis of skin and nail: Secondary | ICD-10-CM

## 2014-01-11 MED ORDER — NYSTATIN POWD: 15.0000 g | Status: DC

## 2014-01-11 MED ORDER — DOXYCYCLINE HYCLATE 100 MG PO TABS: 100.0000 mg | ORAL_TABLET | Freq: Two times a day (BID) | ORAL | Status: DC

## 2014-01-11 MED ORDER — TERBINAFINE HCL 250 MG PO TABS: 250.0000 mg | ORAL_TABLET | Freq: Every day | ORAL | Status: DC

## 2014-01-11 NOTE — Progress Notes (Signed)
   Subjective:    Patient ID: [name] M Cheong, female    DOB: [birth year], 80 y.o.   MRN: [id number]  HPI C/o rash under breasts, axillas, groin, and under abdominal fold.  She has had this rash for Over a year.   Review of Systems C/o rash No chest pain, SOB, HA, dizziness, vision change, N/V, diarrhea, constipation, dysuria, urinary urgency or frequency, myalgias, arthralgias.     Objective:   Physical Exam   Vital signs noted  Well developed well nourished female.  HEENT - Head atraumatic Normocephalic Respiratory - Lungs CTA bilateral Cardiac - RRR S1 and S2 without murmur Skin - Erythematous rash under abdominal fold bilat axilla, breasts, and groin bilateral     Assessment & Plan:  Candidal intertrigo - Plan: doxycycline (VIBRA-TABS) 100 MG tablet, terbinafine (LAMISIL) 250 MG tablet, Nystatin POWD  Recommend using nystatin powder daily under breast, abdominal fold, and groin, take lamisil for a month and doxy bid for 10 days for possible superimposed staph infection on top of yeast.  Follow up prn  Deatra CanterWilliam J Latona Krichbaum FNP

## 2014-01-11 NOTE — Telephone Encounter (Signed)
Patient aware that she will need to be seen and appt scheduled

## 2014-01-18 ENCOUNTER — Telehealth: Payer: Self-pay | Admitting: Family Medicine

## 2014-01-18 NOTE — Telephone Encounter (Signed)
appt scheduled on 11:45 Thurs with Ander SladeBill Oxford, FNP

## 2014-01-21 ENCOUNTER — Ambulatory Visit (INDEPENDENT_AMBULATORY_CARE_PROVIDER_SITE_OTHER): Payer: Medicare Other | Admitting: Family Medicine

## 2014-01-21 ENCOUNTER — Encounter: Payer: Self-pay | Admitting: Family Medicine

## 2014-01-21 VITALS — BP 150/78 | HR 95 | Temp 96.6°F | Ht 70.0 in | Wt 271.0 lb

## 2014-01-21 DIAGNOSIS — E119 Type 2 diabetes mellitus without complications: Secondary | ICD-10-CM

## 2014-01-21 DIAGNOSIS — E039 Hypothyroidism, unspecified: Secondary | ICD-10-CM

## 2014-01-21 LAB — POCT CBC
Granulocyte percent: 61.8 %G (ref 37–80)
HCT, POC: 42.8 % (ref 37.7–47.9)
Hemoglobin: 14.1 g/dL (ref 12.2–16.2)
Lymph, poc: 2.6 (ref 0.6–3.4)
MCH, POC: 30.1 pg (ref 27–31.2)
MCHC: 33 g/dL (ref 31.8–35.4)
MCV: 91.1 fL (ref 80–97)
MPV: 6.5 fL (ref 0–99.8)
POC Granulocyte: 4.6 (ref 2–6.9)
POC LYMPH PERCENT: 35.3 %L (ref 10–50)
Platelet Count, POC: 269 10*3/uL (ref 142–424)
RBC: 4.7 M/uL (ref 4.04–5.48)
RDW, POC: 11.9 %
WBC: 7.5 10*3/uL (ref 4.6–10.2)

## 2014-01-21 LAB — POCT GLYCOSYLATED HEMOGLOBIN (HGB A1C): Hemoglobin A1C: 6.6

## 2014-01-21 NOTE — Progress Notes (Signed)
   Subjective:    Patient ID: [name] [name], female    DOB: May 24, 1933, 80 y.o.   MRN: [id number]  HPI This 80 y.o. female presents for evaluation of follow up on her rash and DM.  She has been having difficulty with candidiasis intertrigo and she has been taking lamisil $RemoveBefore'250mg'DtyMNZhhhpwAK$  po qd and lamisil cream. She reports the rash is better.  She has hx of HTN, DM, and she is due for labs.   Review of Systems No chest pain, SOB, HA, dizziness, vision change, N/V, diarrhea, constipation, dysuria, urinary urgency or frequency, myalgias, arthralgias or rash.     Objective:   Physical Exam Vital signs noted  Well developed well nourished female.  HEENT - Head atraumatic Normocephalic                Eyes - PERRLA, Conjuctiva - clear Sclera- Clear EOMI                Ears - EAC's Wnl TM's Wnl Gross Hearing WNL                Throat - oropharanx wnl Respiratory - Lungs CTA bilateral Cardiac - RRR S1 and S2 without murmur GI - Abdomen soft Nontender and bowel sounds active x 4 Extremities - No edema. Neuro - Grossly intact.       Assessment & Plan:  Type 2 diabetes mellitus without complication - Plan: POCT CBC, CMP14+EGFR, POCT glycosylated hemoglobin (Hb A1C)  Unspecified hypothyroidism - Plan: Thyroid Panel With TSH  Candida Intertrigo - Improved and continue lamisil po and cream and check cmp for LFt's.    Lysbeth Penner FNP

## 2014-01-22 ENCOUNTER — Encounter: Payer: Self-pay | Admitting: *Deleted

## 2014-01-22 LAB — CMP14+EGFR
ALT: 20 IU/L (ref 0–32)
AST: 23 IU/L (ref 0–40)
Albumin/Globulin Ratio: 1.7 (ref 1.1–2.5)
Albumin: 4.3 g/dL (ref 3.5–4.8)
Alkaline Phosphatase: 79 IU/L (ref 39–117)
BUN/Creatinine Ratio: 19 (ref 11–26)
BUN: 16 mg/dL (ref 8–27)
CO2: 26 mmol/L (ref 18–29)
Calcium: 10.2 mg/dL (ref 8.7–10.3)
Chloride: 88 mmol/L — ABNORMAL LOW (ref 97–108)
Creatinine, Ser: 0.86 mg/dL (ref 0.57–1.00)
GFR calc Af Amer: 74 mL/min/1.73
GFR calc non Af Amer: 64 mL/min/1.73
Globulin, Total: 2.5 g/dL (ref 1.5–4.5)
Glucose: 75 mg/dL (ref 65–99)
Potassium: 5.2 mmol/L (ref 3.5–5.2)
Sodium: 127 mmol/L — ABNORMAL LOW (ref 134–144)
Total Bilirubin: 0.6 mg/dL (ref 0.0–1.2)
Total Protein: 6.8 g/dL (ref 6.0–8.5)

## 2014-01-22 LAB — THYROID PANEL WITH TSH
Free Thyroxine Index: 3.4 (ref 1.2–4.9)
T3 Uptake Ratio: 30 % (ref 24–39)
T4, Total: 11.4 ug/dL (ref 4.5–12.0)
TSH: 2.23 u[IU]/mL (ref 0.450–4.500)

## 2014-01-23 ENCOUNTER — Telehealth: Payer: Self-pay | Admitting: Family Medicine

## 2014-01-25 ENCOUNTER — Telehealth: Payer: Self-pay | Admitting: Family Medicine

## 2014-01-25 NOTE — Telephone Encounter (Signed)
Patient aware that [name] [name] is on and vacation and that as soon as he looks at her labs we will give her a call back

## 2014-01-25 NOTE — Telephone Encounter (Signed)
Spoke with pt regarding rash Informed to return to clinic if worsening symptoms

## 2014-01-26 ENCOUNTER — Telehealth: Payer: Self-pay | Admitting: *Deleted

## 2014-01-26 NOTE — Telephone Encounter (Signed)
Message copied by Bearl MulberryUTHERFORD, NATALIE K on Tue Jan 26, 2014 11:51 AM ------      Message from: SorghoHAWKS, [place]ST[place] A      Created: Tue Jan 26, 2014 10:01 AM       CBC (WBC,Hgb, & Plts)- WNL      HgbA1C WNl      Kidney and liver function stable      Thyroid levels WNL       ------

## 2014-01-26 NOTE — Telephone Encounter (Signed)
Pt notified of results Verbalizes understanding 

## 2014-01-30 ENCOUNTER — Other Ambulatory Visit: Payer: Self-pay | Admitting: General Practice

## 2014-02-01 ENCOUNTER — Other Ambulatory Visit: Payer: Self-pay | Admitting: Family

## 2014-02-02 ENCOUNTER — Other Ambulatory Visit: Payer: Self-pay | Admitting: Family Medicine

## 2014-02-02 DIAGNOSIS — F411 Generalized anxiety disorder: Secondary | ICD-10-CM

## 2014-02-02 MED ORDER — ALPRAZOLAM 0.5 MG PO TABS: 0.5000 mg | ORAL_TABLET | Freq: Two times a day (BID) | ORAL | Status: DC | PRN

## 2014-02-02 NOTE — Telephone Encounter (Signed)
Patient last seen in office on 01-21-14. Rx last filled on 12-28-13 for #60. Please advise. If approved please route to pool A so nurse can phone in to pharmacy

## 2014-02-03 ENCOUNTER — Other Ambulatory Visit: Payer: Self-pay | Admitting: Family

## 2014-02-05 ENCOUNTER — Other Ambulatory Visit: Payer: Self-pay | Admitting: Family Medicine

## 2014-02-05 ENCOUNTER — Other Ambulatory Visit: Payer: Self-pay

## 2014-02-05 MED ORDER — HYDROCODONE-ACETAMINOPHEN 5-325 MG PO TABS: ORAL_TABLET | ORAL | Status: DC

## 2014-02-05 NOTE — Telephone Encounter (Signed)
Last seen 01/21/14  B Oxford  If approved print and route to nurse

## 2014-02-05 NOTE — Telephone Encounter (Signed)
Last seen 01/21/14  B Oxford  If approved route to nurse to call into [facility]

## 2014-02-08 ENCOUNTER — Telehealth: Payer: Self-pay | Admitting: Family

## 2014-02-08 NOTE — Telephone Encounter (Signed)
Appointment given for tomorrow with Annette Stable. Patient aware we did not have an appt open with a female provider

## 2014-02-09 ENCOUNTER — Encounter: Payer: Self-pay | Admitting: Family Medicine

## 2014-02-09 ENCOUNTER — Ambulatory Visit (INDEPENDENT_AMBULATORY_CARE_PROVIDER_SITE_OTHER): Payer: Medicare Other | Admitting: Family Medicine

## 2014-02-09 VITALS — BP 140/69 | HR 93 | Temp 96.9°F | Ht 70.0 in | Wt 266.0 lb

## 2014-02-09 DIAGNOSIS — L03311 Cellulitis of abdominal wall: Secondary | ICD-10-CM

## 2014-02-09 DIAGNOSIS — B377 Candidal sepsis: Secondary | ICD-10-CM

## 2014-02-09 DIAGNOSIS — L03319 Cellulitis of trunk, unspecified: Secondary | ICD-10-CM

## 2014-02-09 DIAGNOSIS — L02219 Cutaneous abscess of trunk, unspecified: Secondary | ICD-10-CM

## 2014-02-09 LAB — POCT CBC
Granulocyte percent: 68.2 %G (ref 37–80)
HCT, POC: 39.4 % (ref 37.7–47.9)
Hemoglobin: 13.1 g/dL (ref 12.2–16.2)
Lymph, poc: 2 (ref 0.6–3.4)
MCH, POC: 30.4 pg (ref 27–31.2)
MCHC: 33.3 g/dL (ref 31.8–35.4)
MCV: 91.3 fL (ref 80–97)
MPV: 6.3 fL (ref 0–99.8)
POC Granulocyte: 5.2 (ref 2–6.9)
POC LYMPH PERCENT: 26.9 %L (ref 10–50)
Platelet Count, POC: 261 10*3/uL (ref 142–424)
RBC: 4.3 M/uL (ref 4.04–5.48)
RDW, POC: 12.6 %
WBC: 7.6 10*3/uL (ref 4.6–10.2)

## 2014-02-09 MED ORDER — ERYTHROMYCIN BASE 250 MG PO TABS: 250.0000 mg | ORAL_TABLET | Freq: Four times a day (QID) | ORAL | Status: DC

## 2014-02-09 MED ORDER — FLUCONAZOLE 150 MG PO TABS: ORAL_TABLET | ORAL | Status: DC

## 2014-02-09 NOTE — Progress Notes (Signed)
   Subjective:    Patient ID: [name] [name], female    DOB: 1933/06/06, 80 y.o.   MRN: [id number]  HPI C/o severe rash in her skin folds.  She is taking nystatin powder and she is taking terbenafine  po qd.  She is c/o back pain and lower extremity edema.     Review of Systems    No chest pain, SOB, HA, dizziness, vision change, N/V, diarrhea, constipation, dysuria, urinary urgency or frequency, myalgias, arthralgias or rash.  Objective:   Physical Exam  Vital signs noted  Obese female with walker in NAD  HEENT - Head atraumatic Normocephalic Respiratory - Lungs CTA bilateral Cardiac - RRR S1 and S2 without murmur GI - Pan abdomen soft nontender Skin - Erythematous Rash under large Pan Abdomen fold and in groin and thigh area  Results for orders placed in visit on 02/09/14  POCT CBC      Result Value Ref Range   WBC 7.6  4.6 - 10.2 K/uL   Lymph, poc 2.0  0.6 - 3.4   POC LYMPH PERCENT 26.9  10 - 50 %L   POC Granulocyte 5.2  2 - 6.9   Granulocyte percent 68.2  37 - 80 %G   RBC 4.3  4.04 - 5.48 M/uL   Hemoglobin 13.1  12.2 - 16.2 g/dL   HCT, POC 40.9  81.1 - 47.9 %   MCV 91.3  80 - 97 fL   MCH, POC 30.4  27 - 31.2 pg   MCHC 33.3  31.8 - 35.4 g/dL   RDW, POC 91.4     Platelet Count, POC 261.0  142 - 424 K/uL   MPV 6.3  0 - 99.8 fL      Assessment & Plan:  Cellulitis of abdominal wall - Plan: POCT CBC, fluconazole (DIFLUCAN) 150 MG tablet, erythromycin (E-MYCIN) 250 MG tablet, Ambulatory referral to Dermatology  Disseminated candidiasis - Plan: POCT CBC, fluconazole (DIFLUCAN) 150 MG tablet, erythromycin (E-MYCIN) 250 MG tablet, Ambulatory referral to Dermatology  Deatra Canter FNP

## 2014-02-10 ENCOUNTER — Telehealth: Payer: Self-pay | Admitting: *Deleted

## 2014-02-10 ENCOUNTER — Telehealth: Payer: Self-pay | Admitting: Family

## 2014-02-10 ENCOUNTER — Other Ambulatory Visit: Payer: Self-pay | Admitting: Internal Medicine

## 2014-02-10 ENCOUNTER — Other Ambulatory Visit: Payer: Self-pay | Admitting: *Deleted

## 2014-02-10 MED ORDER — FUROSEMIDE 40 MG PO TABS: 40.0000 mg | ORAL_TABLET | ORAL | Status: DC | PRN

## 2014-02-10 NOTE — Telephone Encounter (Signed)
Number busy x 2 will try later

## 2014-02-10 NOTE — Telephone Encounter (Signed)
Use plain dove soap this is most hypoallergenic

## 2014-02-10 NOTE — Telephone Encounter (Signed)
Pt wants to know if she can use Mycolog cream on place under breast and between legs until she can see specialist.

## 2014-02-10 NOTE — Telephone Encounter (Signed)
Last refill 8/12. This was faxed to [place] from [facility] and they said pt requested this med. Please review. Last ov 02/09/14.

## 2014-02-10 NOTE — Telephone Encounter (Signed)
Yes this would be fine to use mycolog cream under breast

## 2014-02-11 NOTE — Telephone Encounter (Signed)
Patient advised. She has a dermatology appt pending.

## 2014-02-11 NOTE — Telephone Encounter (Signed)
Patient advised. Her daughter brought her Dove Sensitive. She will use. Advised to clean gently and to pat dry but to dry thoroughly. She is aware that dermatology appt is pending. She does not need a gynecology appt. That was a misunderstanding.

## 2014-02-12 ENCOUNTER — Telehealth: Payer: Self-pay | Admitting: Family Medicine

## 2014-02-12 ENCOUNTER — Other Ambulatory Visit: Payer: Self-pay | Admitting: Family Medicine

## 2014-02-12 MED ORDER — CLOTRIMAZOLE 1 % EX CREA: TOPICAL_CREAM | Freq: Two times a day (BID) | CUTANEOUS | Status: DC

## 2014-02-12 NOTE — Telephone Encounter (Signed)
Patient aware.

## 2014-02-12 NOTE — Telephone Encounter (Signed)
Yes you can get shingles in your genital area.  I sent in the cream to her pharmacy

## 2014-02-15 ENCOUNTER — Other Ambulatory Visit: Payer: Self-pay | Admitting: Internal Medicine

## 2014-02-16 ENCOUNTER — Telehealth: Payer: Self-pay | Admitting: *Deleted

## 2014-02-16 ENCOUNTER — Other Ambulatory Visit: Payer: Self-pay | Admitting: Internal Medicine

## 2014-02-16 NOTE — Telephone Encounter (Signed)
Pt gets Lasix filled at [facility] so rx was canceled to [facility]. Spoke with Toniann Fail and they did not receive refill and Lasix called to [facility] per [employer].

## 2014-03-05 ENCOUNTER — Other Ambulatory Visit: Payer: Self-pay | Admitting: Internal Medicine

## 2014-03-05 ENCOUNTER — Other Ambulatory Visit: Payer: Self-pay | Admitting: Family Medicine

## 2014-03-05 ENCOUNTER — Other Ambulatory Visit: Payer: Self-pay | Admitting: *Deleted

## 2014-03-05 MED ORDER — HYDROCODONE-ACETAMINOPHEN 5-325 MG PO TABS
ORAL_TABLET | ORAL | Status: DC
Start: 2014-03-05 — End: 2014-04-09

## 2014-03-05 NOTE — Telephone Encounter (Signed)
Last filled 02/08/2014

## 2014-03-05 NOTE — Telephone Encounter (Signed)
Pt notified Rx for Hydrocodone is ready for pick up RX to front

## 2014-03-12 ENCOUNTER — Ambulatory Visit (INDEPENDENT_AMBULATORY_CARE_PROVIDER_SITE_OTHER): Payer: Medicare Other | Admitting: *Deleted

## 2014-03-12 ENCOUNTER — Encounter: Payer: Self-pay | Admitting: Internal Medicine

## 2014-03-12 DIAGNOSIS — I495 Sick sinus syndrome: Secondary | ICD-10-CM

## 2014-03-12 LAB — MDC_IDC_ENUM_SESS_TYPE_INCLINIC
Battery Impedance: 349 Ohm
Battery Remaining Longevity: 82 mo
Battery Voltage: 2.78 V
Lead Channel Impedance Value: 455 Ohm
Lead Channel Pacing Threshold Amplitude: 0.5 V
Lead Channel Pacing Threshold Pulse Width: 0.46 ms
Lead Channel Setting Pacing Amplitude: 2 V
Lead Channel Setting Pacing Amplitude: 2.5 V
Lead Channel Setting Pacing Pulse Width: 0.46 ms
Lead Channel Setting Sensing Sensitivity: 2.8 mV
MDC IDC MSMT LEADCHNL RA PACING THRESHOLD PULSEWIDTH: 0.4 ms
MDC IDC MSMT LEADCHNL RA SENSING INTR AMPL: 4 mV
MDC IDC MSMT LEADCHNL RV IMPEDANCE VALUE: 695 Ohm
MDC IDC MSMT LEADCHNL RV PACING THRESHOLD AMPLITUDE: 0.75 V
MDC IDC SESS DTM: 20151002133439
MDC IDC STAT BRADY AP VP PERCENT: 97 %
MDC IDC STAT BRADY AP VS PERCENT: 0 %
MDC IDC STAT BRADY AS VP PERCENT: 3 %
MDC IDC STAT BRADY AS VS PERCENT: 0 %

## 2014-03-12 NOTE — Progress Notes (Signed)
PPM check in office. 

## 2014-03-16 ENCOUNTER — Ambulatory Visit: Payer: Medicare Other | Admitting: Family

## 2014-03-23 ENCOUNTER — Telehealth: Payer: Self-pay | Admitting: Family Medicine

## 2014-03-25 ENCOUNTER — Telehealth: Payer: Self-pay | Admitting: Internal Medicine

## 2014-03-25 NOTE — Telephone Encounter (Signed)
Annice PihJackie from [facility] stated that patient need a refill of Diabetic Testing Supplies, testring strips and lancets, any Phone# [phone]  [phone]

## 2014-03-26 NOTE — Telephone Encounter (Signed)
Pt no longer under provider care last saw md 01/2012...Raechel Chute/lmb

## 2014-04-09 ENCOUNTER — Other Ambulatory Visit: Payer: Self-pay | Admitting: *Deleted

## 2014-04-09 MED ORDER — HYDROCODONE-ACETAMINOPHEN 5-325 MG PO TABS: ORAL_TABLET | ORAL | Status: DC

## 2014-04-09 NOTE — Telephone Encounter (Signed)
Patient last seen in office on 02-09-14. Rx last filled on 02-28-14 for #60. Please advise. If approved please route to pool A so nurse can call patient to pick up. Rx will print

## 2014-04-13 ENCOUNTER — Other Ambulatory Visit: Payer: Self-pay | Admitting: General Practice

## 2014-04-13 ENCOUNTER — Other Ambulatory Visit: Payer: Self-pay | Admitting: Family Medicine

## 2014-04-16 ENCOUNTER — Encounter: Payer: Self-pay | Admitting: Family Medicine

## 2014-04-16 ENCOUNTER — Ambulatory Visit (INDEPENDENT_AMBULATORY_CARE_PROVIDER_SITE_OTHER): Payer: Medicare Other | Admitting: *Deleted

## 2014-04-16 ENCOUNTER — Ambulatory Visit (INDEPENDENT_AMBULATORY_CARE_PROVIDER_SITE_OTHER): Payer: Medicare Other | Admitting: Family Medicine

## 2014-04-16 VITALS — BP 143/69 | HR 75 | Temp 97.1°F | Ht 70.0 in | Wt 254.4 lb

## 2014-04-16 DIAGNOSIS — I10 Essential (primary) hypertension: Secondary | ICD-10-CM

## 2014-04-16 DIAGNOSIS — E038 Other specified hypothyroidism: Secondary | ICD-10-CM

## 2014-04-16 DIAGNOSIS — F411 Generalized anxiety disorder: Secondary | ICD-10-CM

## 2014-04-16 DIAGNOSIS — E785 Hyperlipidemia, unspecified: Secondary | ICD-10-CM

## 2014-04-16 DIAGNOSIS — L03039 Cellulitis of unspecified toe: Secondary | ICD-10-CM

## 2014-04-16 DIAGNOSIS — M1990 Unspecified osteoarthritis, unspecified site: Secondary | ICD-10-CM

## 2014-04-16 DIAGNOSIS — E119 Type 2 diabetes mellitus without complications: Secondary | ICD-10-CM

## 2014-04-16 DIAGNOSIS — Z23 Encounter for immunization: Secondary | ICD-10-CM

## 2014-04-16 DIAGNOSIS — R5383 Other fatigue: Secondary | ICD-10-CM

## 2014-04-16 LAB — POCT CBC
Granulocyte percent: 60.6 %G (ref 37–80)
HCT, POC: 41 % (ref 37.7–47.9)
Hemoglobin: 13.4 g/dL (ref 12.2–16.2)
Lymph, poc: 2.4 (ref 0.6–3.4)
MCH, POC: 29.4 pg (ref 27–31.2)
MCHC: 32.8 g/dL (ref 31.8–35.4)
MCV: 89.6 fL (ref 80–97)
MPV: 6.6 fL (ref 0–99.8)
POC Granulocyte: 4.3 (ref 2–6.9)
POC LYMPH PERCENT: 33.8 %L (ref 10–50)
Platelet Count, POC: 255 10*3/uL (ref 142–424)
RBC: 4.6 M/uL (ref 4.04–5.48)
RDW, POC: 12.8 %
WBC: 7.1 10*3/uL (ref 4.6–10.2)

## 2014-04-16 LAB — POCT GLYCOSYLATED HEMOGLOBIN (HGB A1C): Hemoglobin A1C: 6.4

## 2014-04-16 MED ORDER — CLOTRIMAZOLE 1 % EX CREA: TOPICAL_CREAM | Freq: Two times a day (BID) | CUTANEOUS | Status: DC

## 2014-04-16 MED ORDER — DOXYCYCLINE HYCLATE 100 MG PO TABS: 100.0000 mg | ORAL_TABLET | Freq: Two times a day (BID) | ORAL | Status: DC

## 2014-04-16 MED ORDER — GLIMEPIRIDE 2 MG PO TABS: 2.0000 mg | ORAL_TABLET | Freq: Every day | ORAL | Status: DC

## 2014-04-16 MED ORDER — METOPROLOL SUCCINATE ER 50 MG PO TB24: 50.0000 mg | ORAL_TABLET | Freq: Every day | ORAL | Status: DC

## 2014-04-16 MED ORDER — ALPRAZOLAM 0.5 MG PO TABS: 0.5000 mg | ORAL_TABLET | Freq: Two times a day (BID) | ORAL | Status: DC | PRN

## 2014-04-16 MED ORDER — POTASSIUM CHLORIDE CRYS ER 20 MEQ PO TBCR: 20.0000 meq | EXTENDED_RELEASE_TABLET | Freq: Once | ORAL | Status: DC

## 2014-04-16 MED ORDER — LEVOTHYROXINE SODIUM 200 MCG PO TABS: 200.0000 ug | ORAL_TABLET | Freq: Every day | ORAL | Status: DC

## 2014-04-16 MED ORDER — ALBUTEROL SULFATE HFA 108 (90 BASE) MCG/ACT IN AERS: 2.0000 | INHALATION_SPRAY | Freq: Four times a day (QID) | RESPIRATORY_TRACT | Status: DC | PRN

## 2014-04-16 MED ORDER — GABAPENTIN 300 MG PO CAPS: 300.0000 mg | ORAL_CAPSULE | Freq: Three times a day (TID) | ORAL | Status: DC

## 2014-04-16 MED ORDER — CHLORTHALIDONE 25 MG PO TABS: 25.0000 mg | ORAL_TABLET | Freq: Every day | ORAL | Status: DC

## 2014-04-16 MED ORDER — AMLODIPINE BESY-BENAZEPRIL HCL 10-20 MG PO CAPS: 1.0000 | ORAL_CAPSULE | Freq: Every day | ORAL | Status: DC

## 2014-04-16 MED ORDER — LEVOTHYROXINE SODIUM 25 MCG PO TABS: 25.0000 ug | ORAL_TABLET | Freq: Every day | ORAL | Status: DC

## 2014-04-16 MED ORDER — HYDROCODONE-ACETAMINOPHEN 5-325 MG PO TABS: ORAL_TABLET | ORAL | Status: DC

## 2014-04-16 MED ORDER — OMEPRAZOLE 20 MG PO CPDR: 20.0000 mg | DELAYED_RELEASE_CAPSULE | Freq: Every day | ORAL | Status: DC

## 2014-04-16 NOTE — Progress Notes (Signed)
   Subjective:    Patient ID: [name] [name], female    DOB: 11-25-1932, 80 y.o.   MRN: [id number]  HPI Patient is here for follow up.  She is having difficulty with rash and cellulitis under arms and in skin folds  Review of Systems  Constitutional: Negative for fever.  HENT: Negative for ear pain.   Eyes: Negative for discharge.  Respiratory: Negative for cough.   Cardiovascular: Negative for chest pain.  Gastrointestinal: Negative for abdominal distention.  Endocrine: Negative for polyuria.  Genitourinary: Negative for difficulty urinating.  Musculoskeletal: Negative for gait problem and neck pain.  Skin: Negative for color change and rash.  Neurological: Negative for speech difficulty and headaches.  Psychiatric/Behavioral: Negative for agitation.       Objective:    BP 143/69 mmHg  Pulse 75  Temp(Src) 97.1 F (36.2 C) (Oral)  Ht $R'5\' 10"'ji$  (1.778 m)  Wt 254 lb 6.4 oz (115.395 kg)  BMI 36.50 kg/m2 Physical Exam  Constitutional: She is oriented to person, place, and time. She appears well-developed and well-nourished.  HENT:  Head: Normocephalic and atraumatic.  Mouth/Throat: Oropharynx is clear and moist.  Eyes: Pupils are equal, round, and reactive to light.  Neck: Normal range of motion. Neck supple.  Cardiovascular: Normal rate and regular rhythm.   No murmur heard. Pulmonary/Chest: Effort normal and breath sounds normal.  Abdominal: Soft. Bowel sounds are normal. There is no tenderness.  Neurological: She is alert and oriented to person, place, and time.  Skin: Skin is warm and dry.  Psychiatric: She has a normal mood and affect.          Assessment & Plan:     ICD-9-CM ICD-10-CM   1. Diabetes mellitus type 2, controlled, without complications 119.14 N82.9 POCT glycosylated hemoglobin (Hb A1C)     CMP14+EGFR     gabapentin (NEURONTIN) 300 MG capsule     glimepiride (AMARYL) 2 MG tablet  2. Hyperlipemia 272.4 E78.5 CMP14+EGFR     Lipid panel  3. Other  fatigue 780.79 R53.83 POCT CBC  4. Cellulitis of toe, unspecified laterality 681.10 L03.039 clotrimazole (LOTRIMIN AF) 1 % cream     doxycycline (VIBRA-TABS) 100 MG tablet  5. Other specified hypothyroidism 244.8 E03.8 levothyroxine (SYNTHROID, LEVOTHROID) 200 MCG tablet     levothyroxine (SYNTHROID, LEVOTHROID) 25 MCG tablet  6. Essential hypertension 401.9 I10 amLODipine-benazepril (LOTREL) 10-20 MG per capsule     metoprolol succinate (TOPROL-XL) 50 MG 24 hr tablet     chlorthalidone (HYGROTON) 25 MG tablet     albuterol (PROVENTIL HFA;VENTOLIN HFA) 108 (90 BASE) MCG/ACT inhaler     glimepiride (AMARYL) 2 MG tablet     omeprazole (PRILOSEC) 20 MG capsule     potassium chloride SA (K-DUR,KLOR-CON) 20 MEQ tablet  7. Arthritis 716.90 M19.90 HYDROcodone-acetaminophen (NORCO/VICODIN) 5-325 MG per tablet  8. Anxiety state 300.00 F41.1 ALPRAZolam (XANAX) 0.5 MG tablet     No Follow-up on file.  Lysbeth Penner FNP

## 2014-04-17 LAB — CMP14+EGFR
ALT: 28 IU/L (ref 0–32)
AST: 37 IU/L (ref 0–40)
Albumin/Globulin Ratio: 1.7 (ref 1.1–2.5)
Albumin: 4.2 g/dL (ref 3.5–4.8)
Alkaline Phosphatase: 97 IU/L (ref 39–117)
BUN/Creatinine Ratio: 11 (ref 11–26)
BUN: 10 mg/dL (ref 8–27)
CO2: 26 mmol/L (ref 18–29)
Calcium: 9.6 mg/dL (ref 8.7–10.3)
Chloride: 94 mmol/L — ABNORMAL LOW (ref 97–108)
Creatinine, Ser: 0.93 mg/dL (ref 0.57–1.00)
GFR calc Af Amer: 68 mL/min/{1.73_m2} (ref >59)
GFR calc non Af Amer: 59 mL/min/{1.73_m2} — ABNORMAL LOW (ref >59)
Globulin, Total: 2.5 g/dL (ref 1.5–4.5)
Glucose: 146 mg/dL — ABNORMAL HIGH (ref 65–99)
Potassium: 4.3 mmol/L (ref 3.5–5.2)
Sodium: 135 mmol/L (ref 134–144)
Total Bilirubin: 0.3 mg/dL (ref 0.0–1.2)
Total Protein: 6.7 g/dL (ref 6.0–8.5)

## 2014-04-17 LAB — LIPID PANEL
Chol/HDL Ratio: 3.7 ratio units (ref 0.0–4.4)
Cholesterol, Total: 169 mg/dL (ref 100–199)
HDL: 46 mg/dL (ref >39)
LDL Calculated: 84 mg/dL (ref 0–99)
Triglycerides: 193 mg/dL — ABNORMAL HIGH (ref 0–149)
VLDL Cholesterol Cal: 39 mg/dL (ref 5–40)

## 2014-04-20 ENCOUNTER — Other Ambulatory Visit: Payer: Self-pay

## 2014-04-20 DIAGNOSIS — L03311 Cellulitis of abdominal wall: Secondary | ICD-10-CM

## 2014-04-20 NOTE — Addendum Note (Signed)
Addended by: Orma RenderHODGES, Kaydn Kumpf F on: 04/20/2014 06:17 PM   Modules accepted: Orders

## 2014-04-22 ENCOUNTER — Encounter: Payer: Self-pay | Admitting: Family Medicine

## 2014-04-22 ENCOUNTER — Ambulatory Visit (INDEPENDENT_AMBULATORY_CARE_PROVIDER_SITE_OTHER): Payer: Medicare Other | Admitting: Family Medicine

## 2014-04-22 VITALS — BP 155/70 | HR 66 | Temp 97.0°F | Ht 70.0 in | Wt 253.8 lb

## 2014-04-22 DIAGNOSIS — L03039 Cellulitis of unspecified toe: Secondary | ICD-10-CM

## 2014-04-22 DIAGNOSIS — B379 Candidiasis, unspecified: Secondary | ICD-10-CM

## 2014-04-22 MED ORDER — DOXYCYCLINE HYCLATE 100 MG PO TABS: 100.0000 mg | ORAL_TABLET | Freq: Two times a day (BID) | ORAL | Status: DC

## 2014-04-22 MED ORDER — FLUCONAZOLE 150 MG PO TABS: ORAL_TABLET | ORAL | Status: DC

## 2014-04-22 MED ORDER — CLOTRIMAZOLE 1 % EX CREA: TOPICAL_CREAM | Freq: Two times a day (BID) | CUTANEOUS | Status: DC

## 2014-04-22 NOTE — Progress Notes (Signed)
   Subjective:    Patient ID: [name] [name], female    DOB: 10-17-1932, 80 y.o.   MRN: [id number]  HPI Patient is having problems with cellulitis and rash.  She is diabetic.  She has candidiasis rash under breasts and they are worsening.  She was rx'd doxycycline and this helped but she is not responding to nystatin cream.   She is having discomfort on her breasts.  She was advised to get some seasorb powder and she is using this.  Review of Systems  Constitutional: Negative for fever.  HENT: Negative for ear pain.   Eyes: Negative for discharge.  Respiratory: Negative for cough.   Cardiovascular: Negative for chest pain.  Gastrointestinal: Negative for abdominal distention.  Endocrine: Negative for polyuria.  Genitourinary: Negative for difficulty urinating.  Musculoskeletal: Negative for gait problem and neck pain.  Skin: Negative for color change and rash.  Neurological: Negative for speech difficulty and headaches.  Psychiatric/Behavioral: Negative for agitation.       Objective:    BP 155/70 mmHg  Pulse 66  Temp(Src) 97 F (36.1 C) (Oral)  Ht 5\' 10"  (1.778 m)  Wt 253 lb 12.8 oz (115.123 kg)  BMI 36.42 kg/m2 Physical Exam  Constitutional: She is oriented to person, place, and time. She appears well-developed and well-nourished.  HENT:  Head: Normocephalic and atraumatic.  Mouth/Throat: Oropharynx is clear and moist.  Eyes: Pupils are equal, round, and reactive to light.  Neck: Normal range of motion. Neck supple.  Cardiovascular: Normal rate and regular rhythm.   No murmur heard. Pulmonary/Chest: Effort normal and breath sounds normal.  Abdominal: Soft. Bowel sounds are normal. There is no tenderness.  Neurological: She is alert and oriented to person, place, and time.  Skin: Skin is warm and dry. Rash noted. There is erythema.  Erythematous rash under bilateral breasts  Psychiatric: She has a normal mood and affect.          Assessment & Plan:     ICD-9-CM  ICD-10-CM   1. Cellulitis of toe, unspecified laterality 681.10 L03.039 clotrimazole (LOTRIMIN AF) 1 % cream     doxycycline (VIBRA-TABS) 100 MG tablet  2. Candidiasis 112.9 B37.9 fluconazole (DIFLUCAN) 150 MG tablet     clotrimazole (LOTRIMIN AF) 1 % cream     doxycycline (VIBRA-TABS) 100 MG tablet     No Follow-up on file.  Deatra CanterWilliam J Oxford FNP

## 2014-04-23 ENCOUNTER — Telehealth: Payer: Self-pay | Admitting: Family Medicine

## 2014-04-26 ENCOUNTER — Telehealth: Payer: Self-pay | Admitting: Family Medicine

## 2014-04-26 NOTE — Telephone Encounter (Signed)
Aware,use powder only under breast.

## 2014-04-26 NOTE — Telephone Encounter (Signed)
It is ok to use compression stockings

## 2014-04-26 NOTE — Telephone Encounter (Signed)
Aware. 

## 2014-04-27 ENCOUNTER — Telehealth: Payer: Self-pay | Admitting: Family Medicine

## 2014-04-27 NOTE — Telephone Encounter (Signed)
Mr. [name] [name] spoke with pharmacy concerning stockings .

## 2014-05-03 ENCOUNTER — Telehealth: Payer: Self-pay | Admitting: Family Medicine

## 2014-05-03 ENCOUNTER — Other Ambulatory Visit: Payer: Medicare Other

## 2014-05-03 ENCOUNTER — Encounter: Payer: Self-pay | Admitting: *Deleted

## 2014-05-03 DIAGNOSIS — E119 Type 2 diabetes mellitus without complications: Secondary | ICD-10-CM

## 2014-05-04 LAB — MICROALBUMIN, URINE: Microalbumin, Urine: <3 ug/mL (ref 0.0–17.0)

## 2014-05-05 NOTE — Telephone Encounter (Signed)
Filled out and faxed.

## 2014-05-10 ENCOUNTER — Other Ambulatory Visit: Payer: Self-pay | Admitting: Family Medicine

## 2014-05-10 NOTE — Telephone Encounter (Signed)
Last seen 04/22/14  B Oxford   If approved print and route to nurse

## 2014-05-10 NOTE — Telephone Encounter (Signed)
Please review and advise.

## 2014-05-11 NOTE — Telephone Encounter (Signed)
Script for hydrocodone ready. 

## 2014-05-19 ENCOUNTER — Ambulatory Visit (INDEPENDENT_AMBULATORY_CARE_PROVIDER_SITE_OTHER): Payer: Medicare Other | Admitting: Cardiology

## 2014-05-19 ENCOUNTER — Encounter: Payer: Self-pay | Admitting: Cardiology

## 2014-05-19 VITALS — BP 110/70 | HR 65 | Ht 70.0 in | Wt 253.0 lb

## 2014-05-19 DIAGNOSIS — I5032 Chronic diastolic (congestive) heart failure: Secondary | ICD-10-CM

## 2014-05-19 DIAGNOSIS — I6523 Occlusion and stenosis of bilateral carotid arteries: Secondary | ICD-10-CM

## 2014-05-19 DIAGNOSIS — I10 Essential (primary) hypertension: Secondary | ICD-10-CM

## 2014-05-19 NOTE — Patient Instructions (Addendum)

## 2014-05-19 NOTE — Progress Notes (Signed)
HPI The patient presents for follow up of coronary disease though she's not had any problems in years and she had a negative stress test last year.  She has lost 13 lbs since I saw her.  Her lipids and blood sugar are better.  I reviewed these results.  She has had pacemaker follow up.  She gets around very slowly being limited by back and knee pain.. With this level of activity she does not get chest pressure, neck or arm discomfort. She does not get PND or orthopnea. She does have chronic dyspnea with exertion but this has been stable.  She's not noticing any palpitations, presyncope or syncope.   Allergies  Allergen Reactions  . Penicillins     Current Outpatient Prescriptions  Medication Sig Dispense Refill  . albuterol (PROVENTIL HFA;VENTOLIN HFA) 108 (90 BASE) MCG/ACT inhaler Inhale 2 puffs into the lungs every 6 (six) hours as needed for wheezing. 3 Inhaler 3  . ALPRAZolam (XANAX) 0.5 MG tablet Take 1 tablet (0.5 mg total) by mouth 2 (two) times daily as needed for anxiety. 60 tablet 3  . amLODipine-benazepril (LOTREL) 10-20 MG per capsule Take 1 capsule by mouth daily. 90 capsule 3  . aspirin EC 81 MG tablet Take 81 mg by mouth daily.    . chlorthalidone (HYGROTON) 25 MG tablet Take 1 tablet (25 mg total) by mouth daily. 90 tablet 3  . clotrimazole (LOTRIMIN AF) 1 % cream Apply topically 2 (two) times daily. 113 g 3  . furosemide (LASIX) 40 MG tablet Take 1 tablet (40 mg total) by mouth as needed. Fluid retention 30 tablet 3  . gabapentin (NEURONTIN) 300 MG capsule Take 1 capsule (300 mg total) by mouth 3 (three) times daily. 90 capsule 3  . glimepiride (AMARYL) 2 MG tablet Take 1 tablet (2 mg total) by mouth daily with breakfast. 90 tablet 0  . HYDROcodone-acetaminophen (NORCO/VICODIN) 5-325 MG per tablet TAKE 1 TABLET EVERY 12 HOURS AS NEEDED FOR PAIN 60 tablet 0  . insulin NPH Human (HUMULIN N) 100 UNIT/ML injection INJECT 20 UNITS SQ 2 TIMES A DAY BEFORE A MEAL    . levothyroxine  (SYNTHROID, LEVOTHROID) 200 MCG tablet Take 1 tablet (200 mcg total) by mouth daily before breakfast. 90 tablet 0  . levothyroxine (SYNTHROID, LEVOTHROID) 25 MCG tablet Take 1 tablet (25 mcg total) by mouth daily before breakfast. 90 tablet 0  . meclizine (ANTIVERT) 25 MG tablet TAKE 1 TABLET DAILY AS NEEDED FOR DIZZINESS 30 tablet 1  . metoprolol succinate (TOPROL-XL) 50 MG 24 hr tablet Take 1 tablet (50 mg total) by mouth daily. Take with or immediately following a meal. 90 tablet 0  . Multiple Vitamin (MULTIVITAMIN) tablet Take 1 tablet by mouth daily.    . nitroGLYCERIN (NITROLINGUAL) 0.4 MG/SPRAY spray Place 1 spray under the tongue every 5 (five) minutes as needed. 12 g 3  . nystatin-triamcinolone (MYCOLOG II) cream     . omeprazole (PRILOSEC) 20 MG capsule Take 1 capsule (20 mg total) by mouth daily. 90 capsule 0  . potassium chloride SA (K-DUR,KLOR-CON) 20 MEQ tablet Take 1 tablet (20 mEq total) by mouth once. 90 tablet 0  . pravastatin (PRAVACHOL) 80 MG tablet TAKE 1 TABLET ONCE DAILY FOR CHOLESTEROL 90 tablet 0  . triamcinolone cream (KENALOG) 0.1 % Apply topically 2 (two) times daily. 454 g 1  . VESICARE 5 MG tablet TAKE 1 TABLET DAILY 90 tablet 0  . ACCU-CHEK SMARTVIEW test strip Use to test blood sugar  2 times a day 200 each 2  . Blood Glucose Calibration (ACCU-CHEK SMARTVIEW CONTROL) LIQD     . Blood Glucose Monitoring Suppl (ACCU-CHEK NANO SMARTVIEW) W/DEVICE KIT USE TWICE DAILY 1 kit 0  . Nystatin POWD 15 g by Does not apply route 1 day or 1 dose. 15 g 20   No current facility-administered medications for this visit.    Past Medical History  Diagnosis Date  . Arteriosclerotic cardiovascular disease (ASCVD)     Cath in 9/99-70% mid LAD with diffuse distal disease, 70% T1, 60% mid circumflex, 50% mid RCA, normal ejection fraction; negative stress nuclear in 07/2008  . Sinoatrial node dysfunction 2003    Medtronic dual-chamber device  . Cerebrovascular disease     carotid  bruits; no focal disease in 1999, 2002 and 2006  . ANXIETY   . Diastolic CHF, chronic     Normal EF  . COPD   . Hyperlipidemia   . Hypertension   . Hypothyroidism   . Gastroesophageal reflux disease     With hiatal hernia; irritable bowel syndrome; H. pylori-treated; Colonoscopy 2008: non-specific colitis, IH, Diverticulosis, Rectal ulcer secondary to ASA  . OSTEOARTHRITIS, KNEES, BILATERAL     Bilateral TKA  . VITAMIN B12 DEFICIENCY   . Carpal tunnel syndrome   . Peripheral neuropathy   . Diabetes mellitus     Insulin treatment  . Pacemaker   . Closed left fibular fracture 11/30/11    Past Surgical History  Procedure Laterality Date  . Total knee arthroplasty      Bilateral  . Nasal fracture surgery    . Cholecystectomy    . Abdominal hysterectomy    . Hemorrhoid surgery    . Carpal tunnel release    . Thyroidectomy  1980    Goiter  . Bladder repair    . Cardiac pacemaker placement  2003    Medtronic, dual-chamber  . Colonoscopy  2008    ROS:  As stated in the HPI and negative for all other systems.  PHYSICAL EXAM BP 110/70 mmHg  Pulse 65  Ht _0  (1.778 m)  Wt 253 lb (114.76 kg)  BMI 36.30 kg/m2 GENERAL:  Well appearing HEENT:  Pupils equal round and reactive, fundi not visualized, oral mucosa unremarkable NECK:  No jugular venous distention, waveform within normal limits, carotid upstroke brisk and symmetric, no bruits, no thyromegaly LYMPHATICS:  No cervical, inguinal adenopathy LUNGS:  Clear to auscultation bilaterally BACK:  No CVA tenderness CHEST:  Well healed pacemaker scar. HEART:  PMI not displaced or sustained,S1 and S2 within normal limits, no S3, no S4, no clicks, no rubs, no murmurs ABD:  Flat, positive bowel sounds normal in frequency in pitch, no bruits, no rebound, no guarding, no midline pulsatile mass, no hepatomegaly, no splenomegaly EXT:  2 plus pulses throughout, mild edema, no cyanosis no clubbing SKIN:  No rashes no nodules NEURO:   Cranial nerves II through XII grossly intact, motor grossly intact throughout [facility]:  Cognitively intact, oriented to person place and time   EKG:  AV paced rhythm, rate 65  05/19/2014   ASSESSMENT AND PLAN  CAD:  She has stable coronary disease. This point I don't think further cardiovascular testing is suggested. She will continue with risk reduction.  STATUS POST PACEMAKER:  She has follow up with Dr. Lovena Le in Feb  HTN: The blood pressure is at target. No change in medications is indicated. We will continue with therapeutic lifestyle changes (TLC).  DYSLIPIDEMIA:  Her  LDL was 84.  This is improved and she will continue with the meds as listed.   DIABETES:  Her hemoglobin A1c was 6.4.   This is also improved.  CAROTID STENOSIS:  She had bilateral 50-69% stenosis. She is overdue for followup and I will arrange a Doppler.  Marland Kitchen

## 2014-05-26 ENCOUNTER — Telehealth: Payer: Self-pay | Admitting: Family Medicine

## 2014-05-26 NOTE — Telephone Encounter (Signed)
Patient advised to call her cardiology office. She states she received a package in the mail to check her pacemaker and was advised to call [facility] office.

## 2014-05-31 ENCOUNTER — Encounter: Payer: Self-pay | Admitting: Family Medicine

## 2014-05-31 ENCOUNTER — Ambulatory Visit (INDEPENDENT_AMBULATORY_CARE_PROVIDER_SITE_OTHER): Payer: Medicare Other | Admitting: Family Medicine

## 2014-05-31 VITALS — BP 139/78 | HR 86 | Temp 96.6°F | Ht 70.0 in | Wt 259.0 lb

## 2014-05-31 DIAGNOSIS — B372 Candidiasis of skin and nail: Secondary | ICD-10-CM

## 2014-05-31 MED ORDER — NYSTATIN-TRIAMCINOLONE 100000-0.1 UNIT/GM-% EX OINT: 1.0000 "application " | TOPICAL_OINTMENT | Freq: Two times a day (BID) | CUTANEOUS | Status: DC

## 2014-05-31 NOTE — Progress Notes (Signed)
   Subjective:    Patient ID: [name] [name], female    DOB: [birth year], 80 y.o.   MRN: [id number]  HPI 80 year old female with breast pain on the left side. She has not had any lumps that she appreciated or any discharge and the pain is fairly recent onset. She has had issues before with intertrigo or yeast under the skin fold some both her breasts and abdominal tissue. She has been placed on clotrimazole as well as Diflucan. One observer who saw her today remarked how much better and looked in the last time she was here. I explained that this problem is chronic given her body habit and that at best we could control his symptoms but not cure the problem.    Review of Systems  Constitutional: Negative.   HENT: Negative.   Respiratory: Negative.   Cardiovascular: Negative.   Gastrointestinal: Negative.   Genitourinary: Negative.   Skin: Positive for rash.  Neurological: Negative.        Objective:   Physical Exam  Constitutional: She appears well-developed and well-nourished.  Cardiovascular: Normal rate and regular rhythm.   Pulmonary/Chest:  Left breast was palpated for lumps or masses none were present by my exam  Neurological: She is alert.  Skin:  Areas of erythema with well demarcated borders consistent with monilia. These are present both under the breast and in the abdominal adipose tissue.  Psychiatric: She has a normal mood and affect.    BP 139/78 mmHg  Pulse 86  Temp(Src) 96.6 F (35.9 C) (Oral)  Ht 5\' 10"  (1.778 m)  Wt 259 lb (117.482 kg)  BMI 37.16 kg/m2      Assessment & Plan:  1. Skin moniliasis Chronic condition. Will try different approach, Mycolog which is combination of triamcinolone and nystatin. Also talked about control of her diabetes and how this might affect her skin problem.  Frederica KusterStephen M Nechemia Chiappetta MD

## 2014-06-01 ENCOUNTER — Telehealth: Payer: Self-pay | Admitting: Internal Medicine

## 2014-06-01 NOTE — Telephone Encounter (Signed)
Informed pt that transmission was received. Pt verbalized understanding.  

## 2014-06-01 NOTE — Telephone Encounter (Signed)
New Msg      Pt needs help hooking up her device, states she received a new box in the mail and is confused. Please contact [phone].

## 2014-06-01 NOTE — Telephone Encounter (Signed)
Spoke w/ pt and instructed her how to send manual transmission. Pt verbalized understanding.  

## 2014-06-01 NOTE — Telephone Encounter (Signed)
New Msg     Please call pt back about pacemaker transmission.   States she was speaking with someone who was walking her through process today.

## 2014-06-02 ENCOUNTER — Telehealth: Payer: Self-pay | Admitting: Cardiology

## 2014-06-02 NOTE — Telephone Encounter (Signed)
Informed pt that MD will give next transmission date at February 2016 appointment.

## 2014-06-07 ENCOUNTER — Telehealth: Payer: Self-pay | Admitting: Family Medicine

## 2014-06-07 NOTE — Telephone Encounter (Signed)
Hydrocodone filled Dec 1 and not due to refill till Jan. 06-2014.  Xanax has refills left.  Please advise.

## 2014-06-08 ENCOUNTER — Other Ambulatory Visit: Payer: Self-pay | Admitting: Family Medicine

## 2014-06-08 DIAGNOSIS — F411 Generalized anxiety disorder: Secondary | ICD-10-CM

## 2014-06-08 MED ORDER — HYDROCODONE-ACETAMINOPHEN 5-325 MG PO TABS: 1.0000 | ORAL_TABLET | Freq: Four times a day (QID) | ORAL | Status: DC | PRN

## 2014-06-08 MED ORDER — ALPRAZOLAM 0.5 MG PO TABS: 0.5000 mg | ORAL_TABLET | Freq: Two times a day (BID) | ORAL | Status: DC | PRN

## 2014-06-08 NOTE — Telephone Encounter (Signed)
Please review and advise.

## 2014-06-10 ENCOUNTER — Telehealth: Payer: Self-pay | Admitting: Family

## 2014-06-10 NOTE — Telephone Encounter (Signed)
She will see C. Hawks and decide then if she wants to see our pharmacist for better care of her diabetes.

## 2014-06-15 ENCOUNTER — Telehealth: Payer: Self-pay | Admitting: Family

## 2014-06-16 NOTE — Telephone Encounter (Signed)
Patient says she is drinking green diet tea and eating less so her blood sugars are going down.  She needs advise on foods and how to lower the amount of insulin she uses.  Appointment was scheduled with our clinical pharmacist.

## 2014-06-18 ENCOUNTER — Telehealth: Payer: Self-pay | Admitting: Family

## 2014-06-18 NOTE — Telephone Encounter (Signed)
Pt notified to take Tylenol for fever Verbalizes undertanding

## 2014-06-24 ENCOUNTER — Ambulatory Visit (INDEPENDENT_AMBULATORY_CARE_PROVIDER_SITE_OTHER): Payer: Medicare Other | Admitting: Pharmacist

## 2014-06-24 VITALS — BP 138/80 | HR 78 | Ht 70.0 in | Wt 259.0 lb

## 2014-06-24 DIAGNOSIS — E1142 Type 2 diabetes mellitus with diabetic polyneuropathy: Secondary | ICD-10-CM

## 2014-06-24 DIAGNOSIS — E785 Hyperlipidemia, unspecified: Secondary | ICD-10-CM

## 2014-06-24 DIAGNOSIS — R609 Edema, unspecified: Secondary | ICD-10-CM | POA: Diagnosis not present

## 2014-06-24 DIAGNOSIS — E1143 Type 2 diabetes mellitus with diabetic autonomic (poly)neuropathy: Secondary | ICD-10-CM

## 2014-06-24 DIAGNOSIS — E669 Obesity, unspecified: Secondary | ICD-10-CM

## 2014-06-24 DIAGNOSIS — G990 Autonomic neuropathy in diseases classified elsewhere: Secondary | ICD-10-CM

## 2014-06-24 MED ORDER — GLIMEPIRIDE 4 MG PO TABS: 4.0000 mg | ORAL_TABLET | Freq: Every day | ORAL | Status: DC

## 2014-06-24 MED ORDER — METFORMIN HCL ER 500 MG PO TB24: 500.0000 mg | ORAL_TABLET | Freq: Every day | ORAL | Status: DC

## 2014-06-24 MED ORDER — FUROSEMIDE 40 MG PO TABS: 40.0000 mg | ORAL_TABLET | Freq: Every morning | ORAL | Status: DC

## 2014-06-24 NOTE — Progress Notes (Signed)
Subjective:    [name] [name] is a 81 y.o. female who presents for an initial evaluation of Type 2 diabetes mellitus. She states that she is concerned that her the insulin that she is taking is worsening the skin problems she is having in the folds of her abdomen and under her breasts. Current medications for diabetes includes:  Humulin NPH 15 untis BID and glimepride 2mg  1 tablet daily.  She has recently decreased dose of Humulin from 20 to 15 units due to hypoglycemic events.   Known diabetic complications: peripheral neuropathy and cardiovascular disease Cardiovascular risk factors: advanced age (older than 2355 for men, 2565 for women), diabetes mellitus, dyslipidemia, family history of premature cardiovascular disease, hypertension, obesity (BMI >= 30 kg/m2) and sedentary lifestyle   Eye exam current (within one year): unknown Weight trend: stable Prior visit with dietician: no Current diet: in general, an "unhealthy" diet Current exercise: no regular exercise  Patient checks BG 1-2 times daily.  Lowest 82 and highest 170 (reportd by patient)  Is She on ACE inhibitor or angiotensin II receptor blocker?  No  The following portions of the patient's history were reviewed and updated as appropriate: allergies, current medications, past family history, past medical history, past social history, past surgical history and problem list.  Review of Systems Constitutional: negative except for fatigue Cardiovascular: negative except for fatigue, lower extremity edema and 1+ edema bilaterally Integument/breast: negative except for rash Behavioral/Psych: negative except for good mood Endocrine: negative except for diabetic symptoms including increased fatigue    Objective:    BP 138/80 mmHg  Pulse 78  Ht 5\' 10"  (1.778 m)  Wt 259 lb (117.482 kg)  BMI 37.16 kg/m2  General:  alert, cooperative, fatigued, no distress and morbidly obese  Abdomen: abnormal findings:  obese and rash in folds of  abdomen   Lab Review GLUCOSE (mg/dL)  Date Value  40/98/119111/11/2013 146*  01/21/2014 75  09/14/2013 69   GLUCOSE, BLD (mg/dL)  Date Value  47/82/956206/26/2014 153*  06/20/2012 146*  12/04/2010 111*   CO2 (mmol/L)  Date Value  04/16/2014 26  01/21/2014 26  09/14/2013 26   BUN (mg/dL)  Date Value  13/08/657811/11/2013 10  01/21/2014 16  09/14/2013 11  12/04/2012 12  06/20/2012 16  12/04/2010 12   CREAT (mg/dL)  Date Value  46/96/295206/26/2014 0.88  06/20/2012 0.95  12/04/2010 1.05   CREATININE, SER (mg/dL)  Date Value  84/13/244011/11/2013 0.93  01/21/2014 0.86  09/14/2013 0.99     Assessment:    Diabetes Mellitus type II, under good control.   Edema Medication management - patient wish to see if can use oral medications and stop insulin Plan:    1.  Rx changes:   Discontinue chlortalidone  Change furosemide to 40mg  1 tablet daily (no longer just as needed)  Change Insulin NPH to 15 units just at bedtime (discontinue am dose)  Add metformin XR 500mg  1 tablet daily with breakfast - need to monitor CHF / serum creatinine with metformin  Increase glimepiride to 4mg  1 tablet daily   2.  Education: Reviewed 'ABCs' of diabetes management and treatment goals (respective goals in parentheses):  A1C (<7), blood pressure (<130/80), and cholesterol (LDL <100).  4. Follow up: 1 month    Henrene Pastorammy Jeilani Grupe, PharmD, CPP

## 2014-06-28 ENCOUNTER — Telehealth: Payer: Self-pay | Admitting: Family Medicine

## 2014-06-29 ENCOUNTER — Telehealth: Payer: Self-pay | Admitting: Family Medicine

## 2014-06-29 ENCOUNTER — Other Ambulatory Visit: Payer: Self-pay | Admitting: Family Medicine

## 2014-06-29 MED ORDER — NYSTATIN-TRIAMCINOLONE 100000-0.1 UNIT/GM-% EX CREA: TOPICAL_CREAM | Freq: Two times a day (BID) | CUTANEOUS | Status: DC

## 2014-06-29 NOTE — Telephone Encounter (Signed)
Pt CB today and spoke with Joyce GrossKay, pt was given a refill on her mycolog cream by Ander SladeBill Oxford and told to follow up if symptoms continued or worsened, see previous call. Will close encounter.

## 2014-06-29 NOTE — Telephone Encounter (Signed)
Spoke with patient She states she has vomitting and diarrhea  She also has cough and congestion She has a fever She is also complaining with a "draining" rash all over her body and states she is full of infection She wants a refill on the last antibiotic she was given appt was offered to pt but she stated she was "too sick to come in"

## 2014-06-29 NOTE — Telephone Encounter (Signed)
Last visit was with Dr. Hyacinth MeekerMiller and he rx'd mycolog cream for rash due to candidiasis and if this does not help then follow up.  Mycolog cream sent to pharmacy

## 2014-06-30 ENCOUNTER — Telehealth: Payer: Self-pay | Admitting: Family

## 2014-06-30 NOTE — Telephone Encounter (Signed)
Patient complains that areas are itching. Suggested she clean the area with a cool washcloth. Apply an ice pack wrapped in cloth for about 10 min. Pat dry and apply prescribed cream. Patient stated understanding and agreement to plan.

## 2014-06-30 NOTE — Telephone Encounter (Signed)
Patient continues to have a fever of 101. States that her pain medication has tylenol in it but she isn't taking any additional tylenol or ibuprofen.  Nausea and vomiting have resolved. She does complain of central lower back pain. No flank pain. Denies urinary odor, frequency or dysuria.  Advised that mycolog has been sent to pharmacy and suggested ibuprofen for fever. She should f/u in our office tomorrow if symptoms do not improve or sooner if they worsen.  Patient stated understanding and agreement to plan.

## 2014-07-06 ENCOUNTER — Other Ambulatory Visit: Payer: Self-pay | Admitting: Family Medicine

## 2014-07-06 ENCOUNTER — Telehealth: Payer: Self-pay | Admitting: Family Medicine

## 2014-07-06 MED ORDER — HYDROCODONE-ACETAMINOPHEN 5-325 MG PO TABS: 1.0000 | ORAL_TABLET | Freq: Four times a day (QID) | ORAL | Status: DC | PRN

## 2014-07-06 NOTE — Telephone Encounter (Signed)
[name]

## 2014-07-07 ENCOUNTER — Other Ambulatory Visit: Payer: Self-pay | Admitting: *Deleted

## 2014-07-07 DIAGNOSIS — I6523 Occlusion and stenosis of bilateral carotid arteries: Secondary | ICD-10-CM

## 2014-07-07 NOTE — Telephone Encounter (Signed)
Rx ready for pickup at office

## 2014-07-19 ENCOUNTER — Ambulatory Visit: Payer: Medicare Other | Admitting: Family Medicine

## 2014-07-19 ENCOUNTER — Encounter: Payer: Self-pay | Admitting: Family

## 2014-07-19 ENCOUNTER — Ambulatory Visit (INDEPENDENT_AMBULATORY_CARE_PROVIDER_SITE_OTHER): Payer: Medicare HMO | Admitting: Family

## 2014-07-19 VITALS — BP 136/66 | HR 70 | Temp 96.3°F | Ht 70.0 in | Wt 253.8 lb

## 2014-07-19 DIAGNOSIS — J449 Chronic obstructive pulmonary disease, unspecified: Secondary | ICD-10-CM

## 2014-07-19 DIAGNOSIS — Z1321 Encounter for screening for nutritional disorder: Secondary | ICD-10-CM

## 2014-07-19 DIAGNOSIS — I10 Essential (primary) hypertension: Secondary | ICD-10-CM

## 2014-07-19 DIAGNOSIS — IMO0002 Reserved for concepts with insufficient information to code with codable children: Secondary | ICD-10-CM

## 2014-07-19 DIAGNOSIS — E1165 Type 2 diabetes mellitus with hyperglycemia: Secondary | ICD-10-CM

## 2014-07-19 DIAGNOSIS — E538 Deficiency of other specified B group vitamins: Secondary | ICD-10-CM

## 2014-07-19 DIAGNOSIS — M1710 Unilateral primary osteoarthritis, unspecified knee: Secondary | ICD-10-CM

## 2014-07-19 DIAGNOSIS — R300 Dysuria: Secondary | ICD-10-CM

## 2014-07-19 DIAGNOSIS — E785 Hyperlipidemia, unspecified: Secondary | ICD-10-CM

## 2014-07-19 DIAGNOSIS — E119 Type 2 diabetes mellitus without complications: Secondary | ICD-10-CM | POA: Insufficient documentation

## 2014-07-19 DIAGNOSIS — G629 Polyneuropathy, unspecified: Secondary | ICD-10-CM

## 2014-07-19 DIAGNOSIS — Z13820 Encounter for screening for osteoporosis: Secondary | ICD-10-CM

## 2014-07-19 DIAGNOSIS — G4700 Insomnia, unspecified: Secondary | ICD-10-CM

## 2014-07-19 DIAGNOSIS — N3281 Overactive bladder: Secondary | ICD-10-CM

## 2014-07-19 DIAGNOSIS — M179 Osteoarthritis of knee, unspecified: Secondary | ICD-10-CM

## 2014-07-19 DIAGNOSIS — F411 Generalized anxiety disorder: Secondary | ICD-10-CM

## 2014-07-19 DIAGNOSIS — E039 Hypothyroidism, unspecified: Secondary | ICD-10-CM

## 2014-07-19 DIAGNOSIS — K219 Gastro-esophageal reflux disease without esophagitis: Secondary | ICD-10-CM

## 2014-07-19 DIAGNOSIS — I5032 Chronic diastolic (congestive) heart failure: Secondary | ICD-10-CM

## 2014-07-19 LAB — POCT URINALYSIS DIPSTICK
Bilirubin, UA: NEGATIVE
GLUCOSE UA: NEGATIVE
KETONES UA: NEGATIVE
Nitrite, UA: NEGATIVE
PH UA: 6
Spec Grav, UA: 1.015
UROBILINOGEN UA: NEGATIVE

## 2014-07-19 LAB — POCT UA - MICROSCOPIC ONLY
Casts, Ur, LPF, POC: NEGATIVE
Crystals, Ur, HPF, POC: NEGATIVE
Yeast, UA: NEGATIVE

## 2014-07-19 LAB — POCT GLYCOSYLATED HEMOGLOBIN (HGB A1C): HEMOGLOBIN A1C: 7

## 2014-07-19 MED ORDER — BUDESONIDE-FORMOTEROL FUMARATE 80-4.5 MCG/ACT IN AERO: 2.0000 | INHALATION_SPRAY | Freq: Two times a day (BID) | RESPIRATORY_TRACT | Status: DC

## 2014-07-19 NOTE — Progress Notes (Signed)
Subjective:    Patient ID: ADRI SCHLOSS, female    DOB: 10-14-1932, 81 y.o.   MRN: [id number]  Diabetes She presents for her follow-up diabetic visit. She has type 2 diabetes mellitus. Her disease course has been fluctuating. Hypoglycemia symptoms include nervousness/anxiousness. Pertinent negatives for hypoglycemia include no confusion, dizziness, headaches or hunger. Associated symptoms include foot paresthesias. Pertinent negatives for diabetes include no blurred vision, no fatigue and no visual change. Pertinent negatives for hypoglycemia complications include no blackouts and no hospitalization. Symptoms are worsening. Diabetic complications include peripheral neuropathy. Pertinent negatives for diabetic complications include no CVA, heart disease or nephropathy. Risk factors for coronary artery disease include diabetes mellitus, dyslipidemia, hypertension, obesity, post-menopausal and family history. Current diabetic treatment includes insulin injections and oral agent (dual therapy). She is following a generally unhealthy diet. Her breakfast blood glucose range is generally 110-130 mg/dl. An ACE inhibitor/angiotensin II receptor blocker is being taken. Eye exam is not current.  Hyperlipidemia This is a chronic problem. The current episode started more than 1 year ago. The problem is controlled. Recent lipid tests were reviewed and are normal. Exacerbating diseases include diabetes and hypothyroidism. Associated symptoms include shortness of breath (At times). Pertinent negatives include no leg pain or myalgias. Current antihyperlipidemic treatment includes statins. The current treatment provides significant improvement of lipids. Risk factors for coronary artery disease include diabetes mellitus, dyslipidemia, hypertension, obesity and post-menopausal.  Hypertension This is a chronic problem. The current episode started more than 1 year ago. The problem has been waxing and waning since onset. The  problem is controlled. Associated symptoms include anxiety, peripheral edema and shortness of breath (At times). Pertinent negatives include no blurred vision, headaches or palpitations. Risk factors for coronary artery disease include diabetes mellitus, dyslipidemia, obesity and post-menopausal state. Past treatments include ACE inhibitors, beta blockers and calcium channel blockers. The current treatment provides significant improvement. Hypertensive end-organ damage includes a thyroid problem. There is no history of kidney disease, CAD/MI, CVA or heart failure. There is no history of sleep apnea.  Anxiety Presents for follow-up visit. Symptoms include insomnia, nervous/anxious behavior and shortness of breath (At times). Patient reports no confusion, depressed mood, dizziness, excessive worry, irritability, nausea or palpitations. Symptoms occur constantly. The symptoms are aggravated by family issues. The quality of sleep is fair. Nighttime awakenings: occasional.   Her past medical history is significant for anxiety/panic attacks and CAD. There is no history of depression. Past treatments include benzodiazephines.  Gastrophageal Reflux She reports no belching, no coughing, no heartburn, no nausea or no sore throat. This is a chronic problem. The current episode started more than 1 year ago. The problem occurs rarely. The problem has been resolved. The symptoms are aggravated by certain foods and lying down. Pertinent negatives include no fatigue. Risk factors include obesity. She has tried a PPI for the symptoms. The treatment provided significant relief.  Thyroid Problem Presents for follow-up visit. Symptoms include anxiety. Patient reports no constipation, depressed mood, diarrhea, fatigue, palpitations or visual change. The symptoms have been stable. Past treatments include levothyroxine. The treatment provided significant relief. Her past medical history is significant for diabetes and  hyperlipidemia. There is no history of heart failure.  COPD Pt currently only having to use her albuterol inhaler 4 times a month. Pt states she feels that is controlled.      Review of Systems  Constitutional: Negative.  Negative for irritability and fatigue.  HENT: Negative.  Negative for sore throat.   Eyes: Negative.  Negative for blurred vision.  Respiratory: Positive for shortness of breath (At times). Negative for cough.   Cardiovascular: Negative.  Negative for palpitations.  Gastrointestinal: Negative.  Negative for heartburn, nausea, diarrhea and constipation.  Endocrine: Negative.   Genitourinary: Negative.   Musculoskeletal: Positive for back pain. Negative for myalgias.  Neurological: Negative.  Negative for dizziness and headaches.  Hematological: Negative.   Psychiatric/Behavioral: Negative for confusion. The patient is nervous/anxious and has insomnia.   All other systems reviewed and are negative.      Objective:   Physical Exam  Constitutional: She is oriented to person, place, and time. She appears well-developed and well-nourished. No distress.  HENT:  Head: Normocephalic and atraumatic.  Right Ear: External ear normal.  Mouth/Throat: Oropharynx is clear and moist.  Eyes: Pupils are equal, round, and reactive to light.  Neck: Normal range of motion. Neck supple. No thyromegaly present.  Cardiovascular: Normal rate, regular rhythm, normal heart sounds and intact distal pulses.   No murmur heard. Pulmonary/Chest: Effort normal. No respiratory distress. She has wheezes.  Diminished breath sounds bilaterally   Abdominal: Soft. Bowel sounds are normal. She exhibits no distension. There is no tenderness.  Musculoskeletal: Normal range of motion. She exhibits edema (2+ BLE) and tenderness.  Neurological: She is alert and oriented to person, place, and time. She has normal reflexes. No cranial nerve deficit.  Skin: Skin is warm and dry.  Psychiatric: She has a  normal mood and affect. Her behavior is normal. Judgment and thought content normal.  Vitals reviewed.     BP 136/66 mmHg  Pulse 70  Temp(Src) 96.3 F (35.7 C) (Oral)  Ht '5\' 10"'  (1.778 m)  Wt 253 lb 12.8 oz (115.123 kg)  BMI 36.42 kg/m2'    Assessment & Plan:  1. Essential hypertension - CMP14+EGFR  2. Chronic diastolic heart failure - OFV88+QLRJ  3. Chronic obstructive pulmonary disease, unspecified COPD, unspecified chronic bronchitis type - CMP14+EGFR - budesonide-formoterol (SYMBICORT) 80-4.5 MCG/ACT inhaler; Inhale 2 puffs into the lungs 2 (two) times daily.  Dispense: 1 Inhaler; Refill: 3  4. Gastroesophageal reflux disease, esophagitis presence not specified - CMP14+EGFR  5. Hypothyroidism, unspecified hypothyroidism type - CMP14+EGFR - Thyroid Panel With TSH  6. Peripheral neuropathy - CMP14+EGFR  7. OAB (overactive bladder) - CMP14+EGFR  8. Osteoarthrosis, unspecified whether generalized or localized, involving lower leg - CMP14+EGFR  9. Hyperlipidemia - CMP14+EGFR - Lipid panel  10. GAD (generalized anxiety disorder) - CMP14+EGFR  11. Vitamin B 12 deficiency - CMP14+EGFR - Vitamin B12  12. Insomnia - CMP14+EGFR  13. Type 2 diabetes mellitus with hyperglycemia - POCT glycosylated hemoglobin (Hb A1C) - POCT UA - Microscopic Only - CMP14+EGFR  14. Osteoporosis screening  - Vit D  25 hydroxy (rtn osteoporosis monitoring) - DG Bone Density; Future  15. Encounter for vitamin deficiency screening - Vit D  25 hydroxy (rtn osteoporosis monitoring)   Continue all meds Labs pending Health Maintenance reviewed Diet and exercise encouraged RTO 3 months  Evelina Dun, FNP

## 2014-07-19 NOTE — Addendum Note (Signed)
Addended by: Monica BectonHODGES, Ivey Nembhard F on: 07/19/2014 04:43 PM   Modules accepted: Orders

## 2014-07-19 NOTE — Patient Instructions (Signed)

## 2014-07-20 LAB — CMP14+EGFR
A/G RATIO: 1.6 (ref 1.1–2.5)
ALT: 30 IU/L (ref 0–32)
AST: 30 IU/L (ref 0–40)
Albumin: 4.5 g/dL (ref 3.5–4.8)
Alkaline Phosphatase: 100 IU/L (ref 39–117)
BILIRUBIN TOTAL: 0.4 mg/dL (ref 0.0–1.2)
BUN/Creatinine Ratio: 11 (ref 11–26)
BUN: 11 mg/dL (ref 8–27)
CALCIUM: 10.3 mg/dL (ref 8.7–10.3)
CO2: 24 mmol/L (ref 18–29)
Chloride: 93 mmol/L — ABNORMAL LOW (ref 97–108)
Creatinine, Ser: 1 mg/dL (ref 0.57–1.00)
GFR calc non Af Amer: 54 mL/min/{1.73_m2} — ABNORMAL LOW (ref >59)
GFR, EST AFRICAN AMERICAN: 62 mL/min/{1.73_m2} (ref >59)
Globulin, Total: 2.8 g/dL (ref 1.5–4.5)
Glucose: 136 mg/dL — ABNORMAL HIGH (ref 65–99)
POTASSIUM: 4.8 mmol/L (ref 3.5–5.2)
SODIUM: 133 mmol/L — AB (ref 134–144)
Total Protein: 7.3 g/dL (ref 6.0–8.5)

## 2014-07-20 LAB — VITAMIN B12: Vitamin B-12: 318 pg/mL (ref 211–946)

## 2014-07-20 LAB — VITAMIN D 25 HYDROXY (VIT D DEFICIENCY, FRACTURES): Vit D, 25-Hydroxy: 35.7 ng/mL (ref 30.0–100.0)

## 2014-07-20 LAB — LIPID PANEL
Chol/HDL Ratio: 3.2 ratio units (ref 0.0–4.4)
Cholesterol, Total: 137 mg/dL (ref 100–199)
HDL: 43 mg/dL (ref >39)
LDL CALC: 66 mg/dL (ref 0–99)
TRIGLYCERIDES: 141 mg/dL (ref 0–149)
VLDL CHOLESTEROL CAL: 28 mg/dL (ref 5–40)

## 2014-07-20 LAB — THYROID PANEL WITH TSH
Free Thyroxine Index: 2.4 (ref 1.2–4.9)
T3 UPTAKE RATIO: 25 % (ref 24–39)
T4 TOTAL: 9.4 ug/dL (ref 4.5–12.0)
TSH: 11.56 u[IU]/mL — ABNORMAL HIGH (ref 0.450–4.500)

## 2014-07-21 ENCOUNTER — Telehealth: Payer: Self-pay | Admitting: Family

## 2014-07-21 ENCOUNTER — Other Ambulatory Visit: Payer: Self-pay | Admitting: Family

## 2014-07-21 DIAGNOSIS — E038 Other specified hypothyroidism: Secondary | ICD-10-CM

## 2014-07-21 LAB — URINE CULTURE: Organism ID, Bacteria: NO GROWTH

## 2014-07-21 MED ORDER — LEVOTHYROXINE SODIUM 50 MCG PO TABS: 50.0000 ug | ORAL_TABLET | Freq: Every day | ORAL | Status: DC

## 2014-07-21 MED ORDER — LEVOTHYROXINE SODIUM 200 MCG PO TABS: 200.0000 ug | ORAL_TABLET | Freq: Every day | ORAL | Status: DC

## 2014-07-21 NOTE — Telephone Encounter (Signed)
Pt aware of lab results.  She usually get [facility] and wants to make sure the pharmacist at [facility] is aware of  increase in her med and a presc. Is sent to show the change.

## 2014-07-21 NOTE — Telephone Encounter (Signed)
-----   Message from Junie Spencerhristy A Hawks, FNP sent at 07/21/2014 11:04 AM EST ----- HgbA1C slightly elevated- Pt needs to be on low carb diet Urine culture negative for UTI Kidney and liver function stable Cholesterol levels WNL Thyroid levels elevated- Levothyroxine increased to 250 mcg from 225 mcg Vit D and Vit B12 WNL

## 2014-07-22 ENCOUNTER — Other Ambulatory Visit: Payer: Self-pay | Admitting: *Deleted

## 2014-07-22 DIAGNOSIS — E119 Type 2 diabetes mellitus without complications: Secondary | ICD-10-CM

## 2014-07-22 MED ORDER — GLIMEPIRIDE 4 MG PO TABS: 4.0000 mg | ORAL_TABLET | Freq: Every day | ORAL | Status: DC

## 2014-07-22 MED ORDER — METFORMIN HCL ER 500 MG PO TB24: 500.0000 mg | ORAL_TABLET | Freq: Every day | ORAL | Status: DC

## 2014-07-22 MED ORDER — FUROSEMIDE 40 MG PO TABS: 40.0000 mg | ORAL_TABLET | Freq: Every morning | ORAL | Status: DC

## 2014-07-22 MED ORDER — GABAPENTIN 300 MG PO CAPS: 300.0000 mg | ORAL_CAPSULE | Freq: Three times a day (TID) | ORAL | Status: DC

## 2014-07-23 ENCOUNTER — Ambulatory Visit: Payer: Medicare HMO

## 2014-07-23 ENCOUNTER — Encounter: Payer: Self-pay | Admitting: Family

## 2014-07-30 ENCOUNTER — Ambulatory Visit (HOSPITAL_COMMUNITY)
Admission: RE | Admit: 2014-07-30 | Discharge: 2014-07-30 | Disposition: A | Discharge status: Home / Self Care | Payer: Medicare HMO | Source: Ambulatory Visit | Attending: Cardiology | Admitting: Cardiology

## 2014-07-30 ENCOUNTER — Encounter: Payer: Self-pay | Admitting: Internal Medicine

## 2014-07-30 ENCOUNTER — Telehealth: Payer: Self-pay | Admitting: *Deleted

## 2014-07-30 ENCOUNTER — Ambulatory Visit (INDEPENDENT_AMBULATORY_CARE_PROVIDER_SITE_OTHER): Payer: Medicare HMO | Admitting: Internal Medicine

## 2014-07-30 VITALS — BP 126/64 | HR 65 | Ht 70.0 in | Wt 250.6 lb

## 2014-07-30 DIAGNOSIS — E785 Hyperlipidemia, unspecified: Secondary | ICD-10-CM | POA: Diagnosis not present

## 2014-07-30 DIAGNOSIS — I10 Essential (primary) hypertension: Secondary | ICD-10-CM

## 2014-07-30 DIAGNOSIS — E119 Type 2 diabetes mellitus without complications: Secondary | ICD-10-CM | POA: Insufficient documentation

## 2014-07-30 DIAGNOSIS — I6523 Occlusion and stenosis of bilateral carotid arteries: Secondary | ICD-10-CM

## 2014-07-30 DIAGNOSIS — I495 Sick sinus syndrome: Secondary | ICD-10-CM

## 2014-07-30 DIAGNOSIS — I5032 Chronic diastolic (congestive) heart failure: Secondary | ICD-10-CM

## 2014-07-30 DIAGNOSIS — H539 Unspecified visual disturbance: Secondary | ICD-10-CM | POA: Diagnosis not present

## 2014-07-30 DIAGNOSIS — Z950 Presence of cardiac pacemaker: Secondary | ICD-10-CM

## 2014-07-30 LAB — MDC_IDC_ENUM_SESS_TYPE_INCLINIC
Battery Impedance: 449 Ohm
Battery Voltage: 2.77 V
Brady Statistic AP VP Percent: 99 %
Brady Statistic AP VS Percent: 0 %
Brady Statistic AS VP Percent: 1 %
Brady Statistic AS VS Percent: 0 %
Date Time Interrogation Session: 20160219105003
Lead Channel Impedance Value: 455 Ohm
Lead Channel Impedance Value: 661 Ohm
Lead Channel Pacing Threshold Amplitude: 0.5 V
Lead Channel Pacing Threshold Pulse Width: 0.4 ms
Lead Channel Setting Pacing Amplitude: 2.5 V
Lead Channel Setting Pacing Pulse Width: 0.4 ms
Lead Channel Setting Sensing Sensitivity: 2.8 mV
MDC IDC MSMT BATTERY REMAINING LONGEVITY: 75 mo
MDC IDC MSMT LEADCHNL RA SENSING INTR AMPL: 2.8 mV
MDC IDC MSMT LEADCHNL RV PACING THRESHOLD AMPLITUDE: 0.5 V
MDC IDC MSMT LEADCHNL RV PACING THRESHOLD PULSEWIDTH: 0.4 ms
MDC IDC SET LEADCHNL RA PACING AMPLITUDE: 2 V

## 2014-07-30 NOTE — Progress Notes (Signed)
HPI [name] returns today for followup. She is a very pleasant 80 year old woman with a history of multiple medical problems including symptomatic bradycardia due to sinus node dysfunction and complete heart block, status post permanent pacemaker insertion.  She denies syncope, chest pain, but does have peripheral edema. She denies syncope. She admits to dietary indiscretion with sodium. Allergies  Allergen Reactions  . Penicillins      Current Outpatient Prescriptions  Medication Sig Dispense Refill  . ACCU-CHEK SMARTVIEW test strip Use to test blood sugar 2 times a day 200 each 2  . albuterol (PROVENTIL HFA;VENTOLIN HFA) 108 (90 BASE) MCG/ACT inhaler Inhale 2 puffs into the lungs every 6 (six) hours as needed for wheezing. 3 Inhaler 3  . ALPRAZolam (XANAX) 0.5 MG tablet Take 1 tablet (0.5 mg total) by mouth 2 (two) times daily as needed for anxiety. 60 tablet 3  . amLODipine-benazepril (LOTREL) 10-20 MG per capsule Take 1 capsule by mouth daily. 90 capsule 3  . aspirin EC 81 MG tablet Take 81 mg by mouth daily.    . Blood Glucose Calibration (ACCU-CHEK SMARTVIEW CONTROL) LIQD     . Blood Glucose Monitoring Suppl (ACCU-CHEK NANO SMARTVIEW) W/DEVICE KIT USE TWICE DAILY 1 kit 0  . budesonide-formoterol (SYMBICORT) 80-4.5 MCG/ACT inhaler Inhale 2 puffs into the lungs 2 (two) times daily. 1 Inhaler 3  . clotrimazole (LOTRIMIN AF) 1 % cream Apply topically 2 (two) times daily. 113 g 3  . furosemide (LASIX) 40 MG tablet Take 1 tablet (40 mg total) by mouth every morning. Fluid retention 90 tablet 1  . gabapentin (NEURONTIN) 300 MG capsule Take 1 capsule (300 mg total) by mouth 3 (three) times daily. 270 capsule 1  . glimepiride (AMARYL) 4 MG tablet Take 1 tablet (4 mg total) by mouth daily before breakfast. 90 tablet 1  . HYDROcodone-acetaminophen (NORCO/VICODIN) 5-325 MG per tablet Take 1 tablet by mouth every 6 (six) hours as needed for moderate pain. 60 tablet 0  . insulin NPH Human  (HUMULIN N) 100 UNIT/ML injection Inject 0.15 mLs (15 Units total) into the skin at bedtime. 10 mL 1  . levothyroxine (SYNTHROID, LEVOTHROID) 200 MCG tablet Take 1 tablet (200 mcg total) by mouth daily before breakfast. 90 tablet 0  . levothyroxine (SYNTHROID, LEVOTHROID) 50 MCG tablet Take 1 tablet (50 mcg total) by mouth daily. 90 tablet 3  . meclizine (ANTIVERT) 25 MG tablet TAKE 1 TABLET DAILY AS NEEDED FOR DIZZINESS 30 tablet 1  . metFORMIN (GLUCOPHAGE XR) 500 MG 24 hr tablet Take 1 tablet (500 mg total) by mouth daily with breakfast. 90 tablet 1  . metoprolol succinate (TOPROL-XL) 50 MG 24 hr tablet Take 1 tablet (50 mg total) by mouth daily. Take with or immediately following a meal. 90 tablet 0  . Multiple Vitamin (MULTIVITAMIN) tablet Take 1 tablet by mouth daily.    . nitroGLYCERIN (NITROLINGUAL) 0.4 MG/SPRAY spray Place 1 spray under the tongue every 5 (five) minutes as needed. 12 g 3  . Nystatin POWD 15 g by Does not apply route 1 day or 1 dose. 15 g 20  . nystatin-triamcinolone (MYCOLOG II) cream Apply topically 2 (two) times daily. 60 g 3  . omeprazole (PRILOSEC) 20 MG capsule Take 1 capsule (20 mg total) by mouth daily. 90 capsule 0  . potassium chloride SA (K-DUR,KLOR-CON) 20 MEQ tablet Take 1 tablet (20 mEq total) by mouth once. 90 tablet 0  . pravastatin (PRAVACHOL) 80 MG tablet TAKE 1 TABLET ONCE DAILY FOR  CHOLESTEROL 90 tablet 0  . triamcinolone cream (KENALOG) 0.1 % Apply topically 2 (two) times daily. 454 g 1  . VESICARE 5 MG tablet TAKE 1 TABLET DAILY 90 tablet 0   No current facility-administered medications for this visit.     Past Medical History  Diagnosis Date  . Arteriosclerotic cardiovascular disease (ASCVD)     Cath in 9/99-70% mid LAD with diffuse distal disease, 70% T1, 60% mid circumflex, 50% mid RCA, normal ejection fraction; negative stress nuclear in 07/2008  . Sinoatrial node dysfunction 2003    Medtronic dual-chamber device  . Cerebrovascular disease      carotid bruits; no focal disease in 1999, 2002 and 2006  . ANXIETY   . Diastolic CHF, chronic     Normal EF  . COPD   . Hyperlipidemia   . Hypertension   . Hypothyroidism   . Gastroesophageal reflux disease     With hiatal hernia; irritable bowel syndrome; H. pylori-treated; Colonoscopy 2008: non-specific colitis, IH, Diverticulosis, Rectal ulcer secondary to ASA  . OSTEOARTHRITIS, KNEES, BILATERAL     Bilateral TKA  . VITAMIN B12 DEFICIENCY   . Carpal tunnel syndrome   . Peripheral neuropathy   . Diabetes mellitus     Insulin treatment  . Pacemaker   . Closed left fibular fracture 11/30/11    ROS:   All systems reviewed and negative except as noted in the HPI.   Past Surgical History  Procedure Laterality Date  . Total knee arthroplasty      Bilateral  . Nasal fracture surgery    . Cholecystectomy    . Abdominal hysterectomy    . Hemorrhoid surgery    . Carpal tunnel release    . Thyroidectomy  1980    Goiter  . Bladder repair    . Cardiac pacemaker placement  2003    Medtronic, dual-chamber  . Colonoscopy  2008     Family History  Problem Relation Age of Onset  . COPD Father   . Heart failure Mother   . Cancer Sister     colon  . Cancer Brother     lung     History   Social History  . Marital Status: Married    Spouse Name: N/A  . Number of Children: N/A  . Years of Education: 8   Occupational History  .     Social History Main Topics  . Smoking status: Never Smoker   . Smokeless tobacco: Never Used     Comment: Retired- lives with spouse (who has dementia). Pt has 4 children  . Alcohol Use: No  . Drug Use: No  . Sexual Activity: Not Currently   Other Topics Concern  . Not on file   Social History Narrative     BP 126/64 mmHg  Pulse 65  Ht _0  (1.778 m)  Wt 250 lb 10 oz (113.683 kg)  BMI 35.96 kg/m2  SpO2 94%  Physical Exam:  Obese appearing elderly woman, NAD HEENT: Unremarkable Neck:  6 cm JVD, no  thyromegally Lungs:  Clear with no wheezes, rales, or rhonchi. Well-healed pacemaker incision HEART:  Regular rate rhythm, no murmurs, no rubs, no clicks Abd:  soft, positive bowel sounds, no organomegally, no rebound, no guarding Ext:  2 plus pulses, 2+ peripheral edema, no cyanosis, no clubbing Skin:  No rashes no nodules Neuro:  CN II through XII intact, motor grossly intact  DEVICE  Normal device function.  See PaceArt for details.   Assess/Plan:

## 2014-07-30 NOTE — Assessment & Plan Note (Signed)
Her symptoms are class 2. She has mild volume overload. I have asked th patient to increase her lasix to bid for 3-4 days then go back to daily lasix.

## 2014-07-30 NOTE — Telephone Encounter (Signed)
Pt wants to know if she is limited to activities. She wants to go dancing.

## 2014-07-30 NOTE — Assessment & Plan Note (Signed)
Her medtronic DDD PM is working normally. Will recheck in several months. 

## 2014-07-30 NOTE — Telephone Encounter (Signed)
Pt may go dancing,made aware

## 2014-07-30 NOTE — Patient Instructions (Addendum)
Your physician wants you to follow-up in: 1 year with Dr. Ladona Ridgelaylor, and 6 months with Device clinic. You will receive a reminder letter in the mail two months in advance. If you don't receive a letter, please call our office to schedule the follow-up appointment.  Your physician has recommended you make the following change in your medication:  Start Taking Lasix 1 Tablet Daily   Thank you for choosing [facility]!

## 2014-07-30 NOTE — Assessment & Plan Note (Signed)
I have encouraged the patient to lose weight and reduce her fat intake. She will continue her statin therapy.

## 2014-07-30 NOTE — Assessment & Plan Note (Signed)
Her blood pressure is well controlled. No change in medical therapy. She is encouraged to reduce her sodium intake.

## 2014-07-31 ENCOUNTER — Other Ambulatory Visit: Payer: Self-pay | Admitting: Family Medicine

## 2014-08-02 ENCOUNTER — Telehealth: Payer: Self-pay | Admitting: Family

## 2014-08-02 ENCOUNTER — Other Ambulatory Visit: Payer: Self-pay | Admitting: Family Medicine

## 2014-08-02 MED ORDER — CYCLOBENZAPRINE HCL 10 MG PO TABS: 10.0000 mg | ORAL_TABLET | Freq: Three times a day (TID) | ORAL | Status: DC | PRN

## 2014-08-02 MED ORDER — HYDROCODONE-ACETAMINOPHEN 5-325 MG PO TABS: 1.0000 | ORAL_TABLET | Freq: Four times a day (QID) | ORAL | Status: DC | PRN

## 2014-08-02 NOTE — Telephone Encounter (Signed)
Prescription sent to pharmacy and pain rx ready to pick up

## 2014-08-03 ENCOUNTER — Telehealth: Payer: Self-pay | Admitting: Internal Medicine

## 2014-08-03 NOTE — Telephone Encounter (Signed)
Patient calling to get results of carotid duplex. Patient notified of results. All questions answered. Patient voiced understanding.

## 2014-08-03 NOTE — Telephone Encounter (Signed)
Last filled 07/07/14, last seen 07/19/14. Rx will print

## 2014-08-03 NOTE — Telephone Encounter (Signed)
This was refilled on 08/02/14. Did pt pick that prescription up?

## 2014-08-03 NOTE — Telephone Encounter (Signed)
Patient called to get her results from tests last Friday (07/30/14)

## 2014-08-04 NOTE — Telephone Encounter (Signed)
Pt notified RX is ready for pick up 

## 2014-08-04 NOTE — Telephone Encounter (Signed)
Pt aware written rx is the front desk to be picked up

## 2014-08-11 ENCOUNTER — Ambulatory Visit (INDEPENDENT_AMBULATORY_CARE_PROVIDER_SITE_OTHER): Payer: Medicare HMO

## 2014-08-11 ENCOUNTER — Encounter: Payer: Self-pay | Admitting: Pharmacist

## 2014-08-11 ENCOUNTER — Other Ambulatory Visit: Payer: Self-pay | Admitting: Family

## 2014-08-11 ENCOUNTER — Ambulatory Visit (INDEPENDENT_AMBULATORY_CARE_PROVIDER_SITE_OTHER): Payer: Medicare HMO | Admitting: Pharmacist

## 2014-08-11 VITALS — Ht 70.0 in | Wt 252.0 lb

## 2014-08-11 DIAGNOSIS — E034 Atrophy of thyroid (acquired): Secondary | ICD-10-CM

## 2014-08-11 DIAGNOSIS — Z79899 Other long term (current) drug therapy: Secondary | ICD-10-CM

## 2014-08-11 DIAGNOSIS — I10 Essential (primary) hypertension: Secondary | ICD-10-CM

## 2014-08-11 DIAGNOSIS — E038 Other specified hypothyroidism: Secondary | ICD-10-CM | POA: Diagnosis not present

## 2014-08-11 DIAGNOSIS — K219 Gastro-esophageal reflux disease without esophagitis: Secondary | ICD-10-CM

## 2014-08-11 DIAGNOSIS — Z780 Asymptomatic menopausal state: Secondary | ICD-10-CM

## 2014-08-11 LAB — HM DEXA SCAN: HM Dexa Scan: NORMAL

## 2014-08-11 NOTE — Patient Instructions (Signed)
Decrease omeprazole 59m to one capsules every other day for 2 to 4 weeks, then try to discontinue.  If you have symptoms of reflux or heartburn then restart omeprazole.  Fall Prevention and Home Safety Falls cause injuries and can affect all age groups. It is possible to use preventive measures to significantly decrease the likelihood of falls. There are many simple measures which can make your home safer and prevent falls. OUTDOORS  Repair cracks and edges of walkways and driveways.  Remove high doorway thresholds.  Trim shrubbery on the main path into your home.  Have good outside lighting.  Clear walkways of tools, rocks, debris, and clutter.  Check that handrails are not broken and are securely fastened. Both sides of steps should have handrails.  Have leaves, snow, and ice cleared regularly.  Use sand or salt on walkways during winter months.  In the garage, clean up grease or oil spills. BATHROOM  Install night lights.  Install grab bars by the toilet and in the tub and shower.  Use non-skid mats or decals in the tub or shower.  Place a plastic non-slip stool in the shower to sit on, if needed.  Keep floors dry and clean up all water on the floor immediately.  Remove soap buildup in the tub or shower on a regular basis.  Secure bath mats with non-slip, double-sided rug tape.  Remove throw rugs and tripping hazards from the floors. BEDROOMS  Install night lights.  Make sure a bedside light is easy to reach.  Do not use oversized bedding.  Keep a telephone by your bedside.  Have a firm chair with side arms to use for getting dressed.  Remove throw rugs and tripping hazards from the floor. KITCHEN  Keep handles on pots and pans turned toward the center of the stove. Use back burners when possible.  Clean up spills quickly and allow time for drying.  Avoid walking on wet floors.  Avoid hot utensils and knives.  Position shelves so they are not too  high or low.  Place commonly used objects within easy reach.  If necessary, use a sturdy step stool with a grab bar when reaching.  Keep electrical cables out of the way.  Do not use floor polish or wax that makes floors slippery. If you must use wax, use non-skid floor wax.  Remove throw rugs and tripping hazards from the floor. STAIRWAYS  Never leave objects on stairs.  Place handrails on both sides of stairways and use them. Fix any loose handrails. Make sure handrails on both sides of the stairways are as long as the stairs.  Check carpeting to make sure it is firmly attached along stairs. Make repairs to worn or loose carpet promptly.  Avoid placing throw rugs at the top or bottom of stairways, or properly secure the rug with carpet tape to prevent slippage. Get rid of throw rugs, if possible.  Have an electrician put in a light switch at the top and bottom of the stairs. OTHER FALL PREVENTION TIPS  Wear low-heel or rubber-soled shoes that are supportive and fit well. Wear closed toe shoes.  When using a stepladder, make sure it is fully opened and both spreaders are firmly locked. Do not climb a closed stepladder.  Add color or contrast paint or tape to grab bars and handrails in your home. Place contrasting color strips on first and last steps.  Learn and use mobility aids as needed. Install an electrical emergency response system.  Turn  on lights to avoid dark areas. Replace light bulbs that burn out immediately. Get light switches that glow.  Arrange furniture to create clear pathways. Keep furniture in the same place.  Firmly attach carpet with non-skid or double-sided tape.  Eliminate uneven floor surfaces.  Select a carpet pattern that does not visually hide the edge of steps.  Be aware of all pets. OTHER HOME SAFETY TIPS  Set the water temperature for 120 F (48.8 C).  Keep emergency numbers on or near the telephone.  Keep smoke detectors on every level  of the home and near sleeping areas. Document Released: 05/18/2002 Document Revised: 11/27/2011 Document Reviewed: 08/17/2011 [facility] Patient Information 2015 [place], [place]. This information is not intended to replace advice given to you by your health care provider. Make sure you discuss any questions you have with your health care provider.                Exercise for Strong Bones  Exercise is important to build and maintain strong bones / bone density.  There are 2 types of exercises that are important to building and maintaining strong bones:  Weight- bearing and muscle-stregthening.  Weight-bearing Exercises  These exercises include activities that make you move against gravity while staying upright. Weight-bearing exercises can be high-impact or low-impact.  High-impact weight-bearing exercises help build bones and keep them strong. If you have broken a bone due to osteoporosis or are at risk of breaking a bone, you may need to avoid high-impact exercises. If you're not sure, you should check with your healthcare provider.  Examples of high-impact weight-bearing exercises are: Dancing  Doing high-impact aerobics  Hiking  Jogging/running  Jumping Rope  Stair climbing  Tennis  Low-impact weight-bearing exercises can also help keep bones strong and are a safe alternative if you cannot do high-impact exercises.   Examples of low-impact weight-bearing exercises are: Using elliptical training machines  Doing low-impact aerobics  Using stair-step machines  Fast walking on a treadmill or outside   Muscle-Strengthening Exercises These exercises include activities where you move your body, a weight or some other resistance against gravity. They are also known as resistance exercises and include: Lifting weights  Using elastic exercise bands  Using weight machines  Lifting your own body weight  Functional movements, such as standing and rising up on your toes  Yoga and Pilates  can also improve strength, balance and flexibility. However, certain positions may not be safe for people with osteoporosis or those at increased risk of broken bones. For example, exercises that have you bend forward may increase the chance of breaking a bone in the spine.   Non-Impact Exercises There are other types of exercises that can help prevent falls.  Non-impact exercises can help you to improve balance, posture and how well you move in everyday activities. Some of these exercises include: Balance exercises that strengthen your legs and test your balance, such as Tai Chi, can decrease your risk of falls.  Posture exercises that improve your posture and reduce rounded or "sloping" shoulders can help you decrease the chance of breaking a bone, especially in the spine.  Functional exercises that improve how well you move can help you with everyday activities and decrease your chance of falling and breaking a bone. For example, if you have trouble getting up from a chair or climbing stairs, you should do these activities as exercises.   **A physical therapist can teach you balance, posture and functional exercises. He/she can also help  you learn which exercises are safe and appropriate for you.  [facility] has a physical therapy office in [place] in front of our office and referrals can be made for assessments and treatment as needed and strength and balance training.  If you would like to have an assessment with [facility] and our physical therapy team please let a nurse or provider know.

## 2014-08-11 NOTE — Progress Notes (Signed)
Patient ID: [name] [name], female   DOB: [birth year], 81 y.o.   MRN: [id number]  [facility] Current Height: Height: 5\' 10"  (177.8 cm)      Max Lifetime Height:  5\' 11"  Current Weight: Weight: 252 lb (114.306 kg)       Ethnicity:Caucasian    HPI: Does pt already have a diagnosis of:  Osteopenia?  No Osteoporosis?  No  Back Pain?  Yes       Kyphosis?  No Prior fracture?  Yes - left leg (around 2014) Med(s) for Osteoporosis/Osteopenia:  none Med(s) previously tried for Osteoporosis/Osteopenia:  none                                                             PMH: Age at menopause:  Late 30's - surgical Hysterectomy?  Yes Oophorectomy?  No HRT? No Steroid Use?  Yes - Current.  Type/duration: inhaled steroids Thyroid med?  Yes History of cancer?  No History of digestive disorders (ie Crohn's)?  Yes - on chronic PPI therapy Current or previous eating disorders?  No Last Vitamin D Result: 35.7 (07/19/2014) Last GFR Result:  54 (07/19/2014)   FH/SH: Family history of osteoporosis?  No Parent with history of hip fracture?  No Family history of breast cancer?  No Exercise?  No Smoking?  No Alcohol?  No    Calcium Assessment Calcium Intake  # of servings/day  Calcium mg  Milk (8 oz) 1  x  300  = 300mg   Yogurt (4 oz) 0 x  200 = 0  Cheese (1 oz) 1 x  200 = 200mg   Other Calcium sources   250mg   Ca supplement MVI - 400mg   = 400mg    Estimated calcium intake per day 1150mg     DEXA Results Date of Test T-Score for AP Spine L1-L4 T-Score for Total Left Hip T-Score for Total Right Hip  08/11/2014 2.1 1.0 1.2                   Assessment: Normal BMD High risk medication for fracture / osteoporosis - levothyroxine and omeprazole Hypothyroidism   Recommendations: 1.   Discussed BMD  / DEXA results and discussed fracture risk. 2.  recommend calcium 1200mg  daily through supplementation or diet.  3.  recommend weight bearing exercise - 30 minutes at least 4 days per  week.   4.  Counseled and educated about fall risk and prevention. 5.  Recommend try to decrease omeprazole to 20mg  1 capsule every other day and then try to discontinue if able due to safety concerns with long term use.   Recheck DEXA:  2 years  Time spent counseling patient:  30 minutes   Henrene Pastorammy Cindie Rajagopalan, PharmD, CPP

## 2014-08-13 ENCOUNTER — Telehealth: Payer: Self-pay | Admitting: Internal Medicine

## 2014-08-13 NOTE — Telephone Encounter (Signed)
ERROR

## 2014-08-16 ENCOUNTER — Telehealth: Payer: Self-pay | Admitting: Family

## 2014-08-16 ENCOUNTER — Telehealth: Payer: Self-pay | Admitting: Internal Medicine

## 2014-08-16 NOTE — Telephone Encounter (Signed)
Aware, should not try machine that holds you upside down. Speak with workers at [employer] about which exercises and equipment would work best for them.

## 2014-08-16 NOTE — Telephone Encounter (Signed)
Patient wants to know what kind of exercises she can do with the pacemaker. / tgs

## 2014-08-16 NOTE — Telephone Encounter (Signed)
Pt informed she can do any type of exercise after pacemaker

## 2014-08-16 NOTE — Telephone Encounter (Signed)
Called pt, pt [facility] [place]

## 2014-08-20 ENCOUNTER — Ambulatory Visit (INDEPENDENT_AMBULATORY_CARE_PROVIDER_SITE_OTHER): Payer: Medicare HMO | Admitting: Pharmacist

## 2014-08-20 ENCOUNTER — Encounter: Payer: Self-pay | Admitting: Pharmacist

## 2014-08-20 VITALS — BP 140/68 | HR 76 | Ht 70.0 in | Wt 252.0 lb

## 2014-08-20 DIAGNOSIS — E1165 Type 2 diabetes mellitus with hyperglycemia: Secondary | ICD-10-CM

## 2014-08-20 DIAGNOSIS — Z23 Encounter for immunization: Secondary | ICD-10-CM

## 2014-08-20 DIAGNOSIS — Z0000 Encounter for general adult medical examination without abnormal findings: Secondary | ICD-10-CM

## 2014-08-20 MED ORDER — METFORMIN HCL ER 500 MG PO TB24: 500.0000 mg | ORAL_TABLET | Freq: Two times a day (BID) | ORAL | Status: DC

## 2014-08-20 NOTE — Patient Instructions (Addendum)
Mammogram Appointment - Tuesday, March 29th at 3:00pm - [facility]  Health Maintenance Summary     Pneumonia vaccine Completed  Today - 08/20/2014     EYE EXAM Due now  Has appointment - 3.14.16    ZOSTAVAX Postponed 06/11/2018 Checking into cost   Mammogram Due now  See appointment above    INFLUENZA VACCINE Next Due 01/10/2015     HEMOGLOBIN A1C Next Due 01/17/2015 Last A1c was 7.0%    URINE MICROALBUMIN Next Due 05/04/2015     FOOT EXAM Next Due 07/20/2015     DEXA SCAN Next Due 08/10/2016     COLONOSCOPY Next Due 08/19/2016     TETANUS/TDAP Next Due 08/30/2019        Increase metformin XR 500mg  to 1 tablet twice a day with food.

## 2014-08-20 NOTE — Progress Notes (Signed)
Patient ID: [name] [name], female   DOB: 1932-12-25, 81 y.o.   MRN: [id number]   Subjective:   [name] [name] is a 81 y.o. female who presents for an Initial Medicare Annual Wellness Visit and follow up diabetes  HBG readings:  142, 171, 151, 139, 129, 199, 105, 122, 137, 168 Reports no more hypoglycemic episodes since adding metformin and decreasing insulin.  Also report rash under breast and on abdomen has improved.  Current Medications (verified) Outpatient Encounter Prescriptions as of 08/20/2014  Medication Sig  . Alcohol Swabs (ALCOHOL PREP) 70 % PADS   . ALPRAZolam (XANAX) 0.5 MG tablet Take 1 tablet (0.5 mg total) by mouth 2 (two) times daily as needed for anxiety.  Marland Kitchen amLODipine-benazepril (LOTREL) 10-20 MG per capsule Take 1 capsule by mouth daily.  Marland Kitchen aspirin EC 81 MG tablet Take 81 mg by mouth daily.  . ASSURE COMFORT LANCETS 30G MISC   . Blood Glucose Calibration (ACCU-CHEK SMARTVIEW CONTROL) LIQD   . Blood Glucose Monitoring Suppl (ACCU-CHEK NANO SMARTVIEW) W/DEVICE KIT USE TWICE DAILY  . budesonide-formoterol (SYMBICORT) 80-4.5 MCG/ACT inhaler Inhale 2 puffs into the lungs 2 (two) times daily.  . clotrimazole (LOTRIMIN AF) 1 % cream Apply topically 2 (two) times daily.  . furosemide (LASIX) 40 MG tablet Take 1 tablet (40 mg total) by mouth every morning. Fluid retention (Patient taking differently: Take 20-40 mg by mouth every morning. Fluid retention)  . gabapentin (NEURONTIN) 300 MG capsule Take 1 capsule (300 mg total) by mouth 3 (three) times daily.  Marland Kitchen glimepiride (AMARYL) 4 MG tablet Take 1 tablet (4 mg total) by mouth daily before breakfast.  . HYDROcodone-acetaminophen (NORCO/VICODIN) 5-325 MG per tablet Take 1 tablet by mouth every 6 (six) hours as needed for moderate pain.  Marland Kitchen insulin NPH Human (HUMULIN N) 100 UNIT/ML injection Inject 0.15 mLs (15 Units total) into the skin at bedtime.  Marland Kitchen levothyroxine (SYNTHROID, LEVOTHROID) 200 MCG tablet Take 1 tablet (200 mcg total) by  mouth daily before breakfast.  . levothyroxine (SYNTHROID, LEVOTHROID) 50 MCG tablet Take 1 tablet (50 mcg total) by mouth daily.  . meclizine (ANTIVERT) 25 MG tablet TAKE 1 TABLET DAILY AS NEEDED FOR DIZZINESS  . metFORMIN (GLUCOPHAGE XR) 500 MG 24 hr tablet Take 1 tablet (500 mg total) by mouth 2 (two) times daily.  . metoprolol succinate (TOPROL-XL) 50 MG 24 hr tablet Take 1 tablet (50 mg total) by mouth daily. Take with or immediately following a meal.  . Multiple Vitamin (MULTIVITAMIN) tablet Take 1 tablet by mouth daily.  . nitroGLYCERIN (NITROLINGUAL) 0.4 MG/SPRAY spray Place 1 spray under the tongue every 5 (five) minutes as needed.  . Nystatin POWD 15 g by Does not apply route 1 day or 1 dose.  . nystatin-triamcinolone (MYCOLOG II) cream Apply topically 2 (two) times daily.  Marland Kitchen omeprazole (PRILOSEC) 20 MG capsule Take 1 capsule (20 mg total) by mouth every other day.  . potassium chloride SA (K-DUR,KLOR-CON) 20 MEQ tablet Take 1 tablet (20 mEq total) by mouth once.  . pravastatin (PRAVACHOL) 80 MG tablet TAKE 1 TABLET ONCE DAILY FOR CHOLESTEROL  . SURE COMFORT INSULIN SYRINGE 31G X 5/16" 0.3 ML MISC   . [DISCONTINUED] Lancet Devices (ADJUSTABLE LANCING DEVICE) MISC   . [DISCONTINUED] metFORMIN (GLUCOPHAGE XR) 500 MG 24 hr tablet Take 1 tablet (500 mg total) by mouth daily with breakfast.  . albuterol (PROVENTIL HFA;VENTOLIN HFA) 108 (90 BASE) MCG/ACT inhaler Inhale 2 puffs into the lungs every 6 (six) hours  as needed for wheezing.  . cyclobenzaprine (FLEXERIL) 10 MG tablet Take 1 tablet (10 mg total) by mouth 3 (three) times daily as needed for muscle spasms. (Patient not taking: Reported on 08/20/2014)  . triamcinolone cream (KENALOG) 0.1 % Apply topically 2 (two) times daily.  . VESICARE 5 MG tablet TAKE 1 TABLET DAILY (Patient not taking: Reported on 08/20/2014)  . [DISCONTINUED] ACCU-CHEK SMARTVIEW test strip Use to test blood sugar 2 times a day (Patient not taking: Reported on  08/20/2014)    Allergies (verified) Penicillins   History: Past Medical History  Diagnosis Date  . Arteriosclerotic cardiovascular disease (ASCVD)     Cath in 9/99-70% mid LAD with diffuse distal disease, 70% T1, 60% mid circumflex, 50% mid RCA, normal ejection fraction; negative stress nuclear in 07/2008  . Sinoatrial node dysfunction 2003    Medtronic dual-chamber device  . Cerebrovascular disease     carotid bruits; no focal disease in 1999, 2002 and 2006  . ANXIETY   . Diastolic CHF, chronic     Normal EF  . COPD   . Hyperlipidemia   . Hypertension   . Hypothyroidism   . Gastroesophageal reflux disease     With hiatal hernia; irritable bowel syndrome; H. pylori-treated; Colonoscopy 2008: non-specific colitis, IH, Diverticulosis, Rectal ulcer secondary to ASA  . OSTEOARTHRITIS, KNEES, BILATERAL     Bilateral TKA  . VITAMIN B12 DEFICIENCY   . Carpal tunnel syndrome   . Peripheral neuropathy   . Diabetes mellitus     Insulin treatment  . Pacemaker   . Closed left fibular fracture 11/30/11  . Sleep apnea    Past Surgical History  Procedure Laterality Date  . Total knee arthroplasty      Bilateral  . Nasal fracture surgery    . Cholecystectomy    . Abdominal hysterectomy    . Hemorrhoid surgery    . Carpal tunnel release    . Thyroidectomy  1980    Goiter  . Bladder repair    . Cardiac pacemaker placement  2003    Medtronic, dual-chamber  . Colonoscopy  2008   Family History  Problem Relation Age of Onset  . COPD Father   . Heart failure Mother   . Cancer Sister     colon  . Cancer Brother     lung  . Cancer Sister     bladder  . Cancer Brother     lung  . Early death Son   . Diabetes Son    Social History   Occupational History  . retired    Social History Main Topics  . Smoking status: Never Smoker   . Smokeless tobacco: Never Used  . Alcohol Use: No  . Drug Use: No  . Sexual Activity: Not Currently    Do you feel safe at home?   Yes  Current Exercise Habits:: Home exercise routine;Structured exercise class, Type of exercise: Other - see comments (stationary bike and water aerobics), Time (Minutes): 60, Frequency (Times/Week): 2, Weekly Exercise (Minutes/Week): 120, Intensity: Moderate  Current Dietary issues habits:  Has been trying to limit CHO intake and decrease serving sizes since our last visit and we reviewed CHO counting diet.   Objective:    Today's Vitals   08/20/14 1532  BP: 140/68  Pulse: 76  Height: _0  (1.778 m)  Weight: 252 lb (114.306 kg)   1+ pitting edema bilaterally (patient hasn't taken furosemide because she was coming to dr. )  Activities of Daily  Living In your present state of health, do you have any difficulty performing the following activities: 08/20/2014 07/19/2014  Is the patient deaf or have difficulty hearing? N N  Hearing N N  Vision N N  Difficulty concentrating or making decisions Y N  Walking or climbing stairs? N N  Doing errands, shopping? N N  Preparing Food and eating ? N -  Using the Toilet? N -  In the past six months, have you accidently leaked urine? N -  Do you have problems with loss of bowel control? N -  Managing your Medications? N -  Managing your Finances? N -  Housekeeping or managing your Housekeeping? N -    Are there smokers in your home (other than you)? No  Depression Screen PHQ 2/9 Scores 08/20/2014 07/19/2014 02/09/2014 01/11/2014  PHQ - 2 Score 0 0 0 0    Fall Risk Fall Risk  08/20/2014 07/19/2014 02/09/2014 01/11/2014 09/14/2013  Falls in the past year? No No No No Yes  Risk for fall due to : - - - - Other (Comment)  Risk for fall due to (comments): - - - - Tripped over rug at store.    Cognitive Function: MMSE - Mini Mental State Exam 08/20/2014  Orientation to time 5  Orientation to Place 5  Registration 3  Attention/ Calculation 1  Recall 3  Language- name 2 objects 2  Language- repeat 1  Language- follow 3 step command 3  Language- read &  follow direction 0  Write a sentence 0  Copy design 1  Total score 24    Immunizations and Health Maintenance Immunization History  Administered Date(s) Administered  . Influenza Split 03/13/2011  . Influenza Whole 03/11/2009, 02/28/2010  . Influenza,inj,Quad PF,36+ Mos 04/14/2013, 04/16/2014  . Influenza-Unspecified 03/19/2012  . Pneumococcal Conjugate-13 08/20/2014  . Pneumococcal Polysaccharide-23 03/19/2012  . Td 08/29/2009   Health Maintenance Due  Topic Date Due  . PNA vac Low Risk Adult (2 of 2 - PCV13) 03/11/2010    Patient Care Team: Sharion Balloon, FNP as PCP - General (Nurse Practitioner) Evans Lance, MD as Consulting Physician (Cardiology) Inda Castle, MD as Consulting Physician (Gastroenterology) Sherren Mocha, MD as Consulting Physician (Cardiology)  Indicate any recent Medical Services you may have received from other than [facility] providers in the past year (date may be approximate).    Assessment:    Annual Wellness Visit  Type 2 DM  Screening Tests Health Maintenance  Topic Date Due  . PNA vac Low Risk Adult (2 of 2 - PCV13) 03/11/2010  . OPHTHALMOLOGY EXAM  01/17/2015 (Originally 11/07/1942)  . ZOSTAVAX  06/11/2018 (Originally 11/08/1994)  . INFLUENZA VACCINE  01/10/2015  . HEMOGLOBIN A1C  01/17/2015  . URINE MICROALBUMIN  05/04/2015  . FOOT EXAM  07/20/2015  . DEXA SCAN  08/10/2016  . COLONOSCOPY  08/19/2016  . TETANUS/TDAP  08/30/2019        Plan:   During the course of the visit Britzy was educated and counseled about the following appropriate screening and preventive services:   Vaccines to include Pneumoccal, Influenza, Hepatitis B, Td, Zostavax - patient given Prevnar 36 in office today.  Delayed Zostavax because was unable to verify cost.  Colorectal cancer screening - colonoscopy UTD; due to check FOBT - test kit given in office today  Cardiovascular disease screening - UTD  Bone Denisty / Osteoporosis Screening -  UTD  Mammogram - appt made for 09/07/14  PAP - patient does not wish  to have PAP  Glaucoma screening and diabetic eye exam - appt with optometrist for 08/23/14  Increase metformin to 554m 1 tablet BID with food  Reviewed inhaler technique  Nutrition counseling - reviewed CHO counting  Continue current physical activity  Weight - with above 2 changes / recommendation patient should see weight loss.  Follow up in 2-4 months.   Goals    . Reduce sodium intake     Limit added salt and high salt foods to help with swelling and blood pressure    . Reduce sugar and high carbohydrate intake     Limit serving sizes to 1/2 cup of potatoes,fruit, corn, peas and beans. Increase non starchy vegetables - greens beans, broccoli, cauliflower, carrots, greens, lettuce, cabbage, onions, pepper, asparagus and cucumbers.        Patient Instructions (the written plan) were given to the patient.   [facility]  08/20/2014

## 2014-08-23 ENCOUNTER — Other Ambulatory Visit: Payer: Medicare HMO

## 2014-08-23 DIAGNOSIS — Z1212 Encounter for screening for malignant neoplasm of rectum: Secondary | ICD-10-CM

## 2014-08-23 LAB — HM DIABETES EYE EXAM

## 2014-08-23 NOTE — Progress Notes (Signed)
Lab only 

## 2014-08-24 ENCOUNTER — Encounter: Payer: Self-pay | Admitting: *Deleted

## 2014-08-24 ENCOUNTER — Telehealth: Payer: Self-pay | Admitting: Family

## 2014-08-24 NOTE — Telephone Encounter (Signed)
Report received from eye doctor.  Mild retinal-vascular dot/blot hemorrhages noted. Discussed with Jannifer Rodneyhristy Hawks, FNP.  Advised patient that most likely changes related to diabetes and hypertension.  She should f/u with eye doctor if she continues to have concerns.

## 2014-08-25 LAB — FECAL OCCULT BLOOD, IMMUNOCHEMICAL: Fecal Occult Bld: NEGATIVE

## 2014-08-26 ENCOUNTER — Telehealth: Payer: Self-pay | Admitting: Family

## 2014-08-26 NOTE — Telephone Encounter (Signed)
appt made from Mrs. Joss 10/18/14 at 2pm and also for her husband for AWV at 2:30pm

## 2014-08-26 NOTE — Telephone Encounter (Signed)
Patient wasn't able to come 10/18/14 - rechseduled for 10/13/14.  Patient aware

## 2014-08-27 ENCOUNTER — Telehealth: Payer: Self-pay | Admitting: Family

## 2014-08-27 NOTE — Telephone Encounter (Signed)
Called pt back, she said she was not afraid of him, she was afraid for him to be left alone and needed to go to do some errands tomorrow. She was looking to see if we could get someone to sit with him tomorrow.Explained that we do not have anyone and she would have to contact someone and pay privately for this. She is bringing him in this Tuesday and will do a face to face so we can order some home health.

## 2014-08-31 ENCOUNTER — Other Ambulatory Visit: Payer: Self-pay | Admitting: Family

## 2014-08-31 MED ORDER — HYDROCODONE-ACETAMINOPHEN 5-325 MG PO TABS: 1.0000 | ORAL_TABLET | Freq: Four times a day (QID) | ORAL | Status: DC | PRN

## 2014-08-31 MED ORDER — MUPIROCIN 2 % EX OINT: 1.0000 "application " | TOPICAL_OINTMENT | Freq: Two times a day (BID) | CUTANEOUS | Status: DC

## 2014-08-31 MED ORDER — NYSTATIN-TRIAMCINOLONE 100000-0.1 UNIT/GM-% EX CREA: TOPICAL_CREAM | Freq: Two times a day (BID) | CUTANEOUS | Status: DC

## 2014-09-13 ENCOUNTER — Telehealth: Payer: Self-pay | Admitting: Family

## 2014-09-13 NOTE — Telephone Encounter (Signed)
Stp reviewed how to take gabapentin and how often as well as her symbicort. Pt repeated instructions correctly back to me.

## 2014-09-22 ENCOUNTER — Telehealth: Payer: Self-pay | Admitting: Family

## 2014-09-22 MED ORDER — KEFLEX 500 MG PO CAPS: 2000.0000 mg | ORAL_CAPSULE | Freq: Once | ORAL | Status: DC

## 2014-09-22 NOTE — Telephone Encounter (Signed)
Patient has some heart problems and she needs dental work and she use to have to take antibiotic before procedure and wants to know if she needs to do that now.

## 2014-09-24 ENCOUNTER — Telehealth: Payer: Self-pay | Admitting: Pharmacist

## 2014-09-24 NOTE — Telephone Encounter (Signed)
Patient BG in 160 to 200 range.  Increase NPH insulin from 15 to 20 units daily.  Appt in 2 weeks.

## 2014-09-29 ENCOUNTER — Other Ambulatory Visit: Payer: Self-pay | Admitting: Family

## 2014-09-29 ENCOUNTER — Other Ambulatory Visit: Payer: Self-pay | Admitting: Family Medicine

## 2014-09-30 NOTE — Telephone Encounter (Signed)
Last seen 07/19/14 [name] [name]  If approved print

## 2014-09-30 NOTE — Telephone Encounter (Signed)
Last seen 07/19/14 [name] [name]  If approved route to nurse to call into [facility] 

## 2014-10-01 NOTE — Telephone Encounter (Signed)
Refill called to pharmacy.

## 2014-10-01 NOTE — Telephone Encounter (Signed)
RX ready for pick up 

## 2014-10-01 NOTE — Telephone Encounter (Signed)
Pt aware written Rx ready at front desk for pickup 

## 2014-10-13 ENCOUNTER — Other Ambulatory Visit: Payer: Self-pay | Admitting: Pharmacist

## 2014-10-13 ENCOUNTER — Ambulatory Visit (INDEPENDENT_AMBULATORY_CARE_PROVIDER_SITE_OTHER): Payer: Medicare Other | Admitting: Pharmacist

## 2014-10-13 ENCOUNTER — Encounter: Payer: Self-pay | Admitting: Pharmacist

## 2014-10-13 VITALS — BP 140/76 | HR 70 | Ht 70.0 in | Wt 249.0 lb

## 2014-10-13 DIAGNOSIS — R600 Localized edema: Secondary | ICD-10-CM

## 2014-10-13 DIAGNOSIS — E1142 Type 2 diabetes mellitus with diabetic polyneuropathy: Secondary | ICD-10-CM

## 2014-10-13 DIAGNOSIS — E1143 Type 2 diabetes mellitus with diabetic autonomic (poly)neuropathy: Secondary | ICD-10-CM

## 2014-10-13 DIAGNOSIS — E1149 Type 2 diabetes mellitus with other diabetic neurological complication: Secondary | ICD-10-CM

## 2014-10-13 DIAGNOSIS — G990 Autonomic neuropathy in diseases classified elsewhere: Secondary | ICD-10-CM | POA: Diagnosis not present

## 2014-10-13 MED ORDER — FUROSEMIDE 40 MG PO TABS: 20.0000 mg | ORAL_TABLET | ORAL | Status: DC

## 2014-10-13 MED ORDER — METFORMIN HCL 1000 MG PO TABS: 1000.0000 mg | ORAL_TABLET | Freq: Two times a day (BID) | ORAL | Status: DC

## 2014-10-13 NOTE — Patient Instructions (Addendum)
We are increasing metformin to 1000mg  take 1 tablet twice a day with food.  [facility]  You can use up the prescription you have of metformin XR 500mg  and take 2 tablets twice a day with food.   Restart furosedmide 40mg  but take only 1/2 tablet once each morning for fluid / swelling.  Stop omeprozole if you have not already stopped.   Contineu glimepiride 4mg  take 1 tablet daily and insulin NPH inject 20 units once a day.

## 2014-10-13 NOTE — Progress Notes (Signed)
Patient ID: [name] [name], female   DOB: 16-Apr-1933, 81 y.o.   MRN: [id number] Subjective:    [name] [name] is a 81 y.o. female who presents for reevaluation of Type 2 diabetes mellitus. Current medications for diabetes includes:  Humulin NPH 20 units qd (has decreased over last 6 months from 20units bid) glimepride 4mg  1 tablet daily and metformin XR 500mg  1 tablet bid.   Known diabetic complications: peripheral neuropathy and cardiovascular disease Cardiovascular risk factors: advanced age (older than 4155 for men, 7965 for women), diabetes mellitus, dyslipidemia, family history of premature cardiovascular disease, hypertension, obesity (BMI >= 30 kg/m2) and sedentary lifestyle   Eye exam current (within one year): unknown Weight trend: has decreased by 10# since our first visit 06/24/2014 Prior visit with dietician: no Current diet: has improved but patient admits to times of high CHO intake Current exercise: no regular exercise  Patient checks BG 1-2 times daily.  Lowest 105 and highest 227 (per glucometer).  Patient was previously having frequently nocturnal hypoglycemia but has not experienced any hypoglycemia in the last 60 days.  7 day avg = 171; 14 day avg = 165; 30 day avg = 168; 90 days avg = 161.  Is She on ACE inhibitor or angiotensin II receptor blocker?  No  The following portions of the patient's history were reviewed and updated as appropriate: allergies, current medications, past family history, past medical history, past social history, past surgical history and problem list.  Review of Systems Constitutional: negative except for fatigue Cardiovascular: negative except for fatigue, lower extremity edema and 1+ edema bilaterally Integument/breast: negative except for rash Behavioral/Psych: negative except for good mood Endocrine: negative except for diabetic symptoms including increased fatigue    Objective:    BP 140/76 mmHg  Pulse 70  Ht 5\' 10"  (1.778 m)  Wt 249 lb  (112.946 kg)  BMI 35.73 kg/m2  Positive LEE bilaterally - 1+  Lab Review GLUCOSE (mg/dL)  Date Value  44/01/027202/01/2015 136*  04/16/2014 146*  01/21/2014 75   GLUCOSE, BLD (mg/dL)  Date Value  53/66/440306/26/2014 153*  06/20/2012 146*  12/04/2010 111*   CO2 (mmol/L)  Date Value  07/19/2014 24  04/16/2014 26  01/21/2014 26   BUN (mg/dL)  Date Value  47/42/595602/01/2015 11  04/16/2014 10  01/21/2014 16  12/04/2012 12  06/20/2012 16  12/04/2010 12   CREAT (mg/dL)  Date Value  38/75/643306/26/2014 0.88  06/20/2012 0.95  12/04/2010 1.05   CREATININE, SER (mg/dL)  Date Value  29/51/884102/01/2015 1.00  04/16/2014 0.93  01/21/2014 0.86    Last A1c = 7.0% (07/19/2014)  Assessment:    Diabetes Mellitus type II, under good control.   Edema - has increase since stopped furosemide  Plan:    1.  Rx changes:   Change furosemide to 40mg  1/2 tablet daily in the morning.  Continue Insulin NPH 20 units just at bedtime  Increase metformin to 1000mg - take 1 tablet BID with food- need to monitor CHF / serum creatinine   Continue glimepiride to 4mg  1 tablet daily   2.  Education: Reviewed 'ABCs' of diabetes management and treatment goals (respective goals in parentheses):  A1C (<7), blood pressure (<130/80), and cholesterol (LDL <100). 3.  RTC in 1 weeks to recheck BMET and A1c  4.  Follow up with PCP July 2016;  Follow up with me in 3-4 months.    Henrene Pastorammy Savian Mazon, PharmD, CPP

## 2014-10-18 ENCOUNTER — Other Ambulatory Visit: Payer: Self-pay | Admitting: Family Medicine

## 2014-10-18 ENCOUNTER — Ambulatory Visit: Payer: Self-pay

## 2014-10-20 ENCOUNTER — Other Ambulatory Visit (INDEPENDENT_AMBULATORY_CARE_PROVIDER_SITE_OTHER): Payer: Medicare Other

## 2014-10-20 ENCOUNTER — Other Ambulatory Visit: Payer: Self-pay | Admitting: Pharmacist

## 2014-10-20 DIAGNOSIS — E1149 Type 2 diabetes mellitus with other diabetic neurological complication: Secondary | ICD-10-CM

## 2014-10-20 DIAGNOSIS — E0840 Diabetes mellitus due to underlying condition with diabetic neuropathy, unspecified: Secondary | ICD-10-CM

## 2014-10-20 DIAGNOSIS — R300 Dysuria: Secondary | ICD-10-CM | POA: Diagnosis not present

## 2014-10-20 LAB — POCT UA - MICROSCOPIC ONLY
Casts, Ur, LPF, POC: NEGATIVE
Crystals, Ur, HPF, POC: NEGATIVE
MUCUS UA: NEGATIVE
YEAST UA: NEGATIVE

## 2014-10-20 LAB — POCT GLYCOSYLATED HEMOGLOBIN (HGB A1C): HEMOGLOBIN A1C: 8.4

## 2014-10-20 LAB — POCT URINALYSIS DIPSTICK
Bilirubin, UA: NEGATIVE
Glucose, UA: NEGATIVE
KETONES UA: NEGATIVE
Nitrite, UA: NEGATIVE
PH UA: 6
SPEC GRAV UA: 1.025
Urobilinogen, UA: NEGATIVE

## 2014-10-20 MED ORDER — HYDROCODONE-ACETAMINOPHEN 5-325 MG PO TABS: ORAL_TABLET | ORAL | Status: DC

## 2014-10-20 MED ORDER — SOLIFENACIN SUCCINATE 5 MG PO TABS: 5.0000 mg | ORAL_TABLET | Freq: Every day | ORAL | Status: DC

## 2014-10-21 LAB — BMP8+EGFR
BUN/Creatinine Ratio: 10 — ABNORMAL LOW (ref 11–26)
BUN: 8 mg/dL (ref 8–27)
CO2: 27 mmol/L (ref 18–29)
Calcium: 9.6 mg/dL (ref 8.7–10.3)
Chloride: 95 mmol/L — ABNORMAL LOW (ref 97–108)
Creatinine, Ser: 0.8 mg/dL (ref 0.57–1.00)
GFR calc Af Amer: 81 mL/min/{1.73_m2} (ref >59)
GFR calc non Af Amer: 70 mL/min/{1.73_m2} (ref >59)
Glucose: 181 mg/dL — ABNORMAL HIGH (ref 65–99)
Potassium: 4.6 mmol/L (ref 3.5–5.2)
Sodium: 138 mmol/L (ref 134–144)

## 2014-10-21 LAB — MICROALBUMIN, URINE: Microalbumin, Urine: 16.2 ug/mL

## 2014-10-22 ENCOUNTER — Other Ambulatory Visit: Payer: Self-pay | Admitting: Family

## 2014-10-22 ENCOUNTER — Other Ambulatory Visit: Payer: Self-pay | Admitting: Pharmacist

## 2014-10-22 ENCOUNTER — Other Ambulatory Visit: Payer: Self-pay | Admitting: Family Medicine

## 2014-11-04 ENCOUNTER — Telehealth: Payer: Self-pay | Admitting: Family

## 2014-11-04 NOTE — Telephone Encounter (Signed)
Stp and advised that she should be taking her lasix in the morning and not at night. Pt was told by [name] at her last appt to take 1/2 a pill in the morning and not at night. Pt voiced understanding.

## 2014-11-22 ENCOUNTER — Other Ambulatory Visit: Payer: Self-pay | Admitting: Family Medicine

## 2014-11-23 ENCOUNTER — Other Ambulatory Visit: Payer: Self-pay | Admitting: Family

## 2014-11-23 MED ORDER — HYDROCODONE-ACETAMINOPHEN 5-325 MG PO TABS: ORAL_TABLET | ORAL | Status: DC

## 2014-11-23 NOTE — Telephone Encounter (Signed)
Script for xanax called in and script for pain medicine at front.Patient notified.

## 2014-11-23 NOTE — Progress Notes (Signed)
Na/ww 6/14

## 2014-11-23 NOTE — Progress Notes (Signed)
RX ready for pick up 

## 2014-11-23 NOTE — Telephone Encounter (Signed)
Last seen 07/19/14 Neysa Bonito  If approved route to nurse to call into [facility]

## 2014-11-24 NOTE — Telephone Encounter (Signed)
Last seen 07/19/14 [facility]

## 2014-11-30 ENCOUNTER — Telehealth: Payer: Self-pay | Admitting: Family

## 2014-11-30 MED ORDER — FUROSEMIDE 20 MG PO TABS: 20.0000 mg | ORAL_TABLET | Freq: Every day | ORAL | Status: DC

## 2014-11-30 NOTE — Telephone Encounter (Signed)
A new rx of lasix 20 mg sent to pharmacy. Pt can take half a pill daily and if she has swelling she can take up  to a 20 mg daily for edema.

## 2014-11-30 NOTE — Telephone Encounter (Signed)
Pt aware and seems to understand.

## 2014-11-30 NOTE — Telephone Encounter (Signed)
Do you want patient to come in and be seen or address over the phone? Please advise

## 2014-12-10 ENCOUNTER — Ambulatory Visit: Payer: Medicare Other | Admitting: Family

## 2014-12-15 ENCOUNTER — Other Ambulatory Visit: Payer: Self-pay | Admitting: Family

## 2014-12-23 ENCOUNTER — Telehealth: Payer: Self-pay

## 2014-12-23 ENCOUNTER — Other Ambulatory Visit: Payer: Self-pay | Admitting: Family Medicine

## 2014-12-23 ENCOUNTER — Telehealth: Payer: Self-pay | Admitting: Family

## 2014-12-23 NOTE — Telephone Encounter (Signed)
Last filled 11/23/14, last seen 07/19/14. Route to pool, [facility]

## 2014-12-23 NOTE — Telephone Encounter (Signed)
Insurance prior authorized Nystatin/triamcinolone through 06/11/15

## 2014-12-24 MED ORDER — HYDROCODONE-ACETAMINOPHEN 5-325 MG PO TABS: ORAL_TABLET | ORAL | Status: DC

## 2014-12-24 NOTE — Telephone Encounter (Signed)
RX ready for pick up 

## 2014-12-24 NOTE — Telephone Encounter (Signed)
rx called into pharmacy

## 2014-12-24 NOTE — Telephone Encounter (Signed)
Patient aware that rx is ready to be picked up.  

## 2014-12-27 ENCOUNTER — Other Ambulatory Visit: Payer: Self-pay | Admitting: Family

## 2015-01-05 ENCOUNTER — Ambulatory Visit: Payer: Self-pay | Admitting: Family

## 2015-01-10 ENCOUNTER — Other Ambulatory Visit: Payer: Self-pay | Admitting: Family

## 2015-01-10 ENCOUNTER — Other Ambulatory Visit: Payer: Self-pay | Admitting: Family Medicine

## 2015-01-10 NOTE — Telephone Encounter (Signed)
Last seen 07/19/14 [name] [name]     

## 2015-01-11 ENCOUNTER — Encounter: Payer: Self-pay | Admitting: Family

## 2015-01-11 ENCOUNTER — Ambulatory Visit (INDEPENDENT_AMBULATORY_CARE_PROVIDER_SITE_OTHER): Payer: Medicare Other | Admitting: Family

## 2015-01-11 VITALS — BP 126/68 | HR 60 | Temp 96.0°F | Ht 70.0 in | Wt 237.4 lb

## 2015-01-11 DIAGNOSIS — IMO0002 Reserved for concepts with insufficient information to code with codable children: Secondary | ICD-10-CM

## 2015-01-11 DIAGNOSIS — E669 Obesity, unspecified: Secondary | ICD-10-CM

## 2015-01-11 DIAGNOSIS — G629 Polyneuropathy, unspecified: Secondary | ICD-10-CM | POA: Diagnosis not present

## 2015-01-11 DIAGNOSIS — M1710 Unilateral primary osteoarthritis, unspecified knee: Secondary | ICD-10-CM

## 2015-01-11 DIAGNOSIS — I5032 Chronic diastolic (congestive) heart failure: Secondary | ICD-10-CM | POA: Diagnosis not present

## 2015-01-11 DIAGNOSIS — I10 Essential (primary) hypertension: Secondary | ICD-10-CM

## 2015-01-11 DIAGNOSIS — E039 Hypothyroidism, unspecified: Secondary | ICD-10-CM | POA: Diagnosis not present

## 2015-01-11 DIAGNOSIS — E538 Deficiency of other specified B group vitamins: Secondary | ICD-10-CM

## 2015-01-11 DIAGNOSIS — E785 Hyperlipidemia, unspecified: Secondary | ICD-10-CM

## 2015-01-11 DIAGNOSIS — M179 Osteoarthritis of knee, unspecified: Secondary | ICD-10-CM

## 2015-01-11 DIAGNOSIS — G4700 Insomnia, unspecified: Secondary | ICD-10-CM

## 2015-01-11 DIAGNOSIS — K219 Gastro-esophageal reflux disease without esophagitis: Secondary | ICD-10-CM

## 2015-01-11 DIAGNOSIS — Z23 Encounter for immunization: Secondary | ICD-10-CM

## 2015-01-11 DIAGNOSIS — E1165 Type 2 diabetes mellitus with hyperglycemia: Secondary | ICD-10-CM

## 2015-01-11 DIAGNOSIS — F411 Generalized anxiety disorder: Secondary | ICD-10-CM

## 2015-01-11 DIAGNOSIS — J449 Chronic obstructive pulmonary disease, unspecified: Secondary | ICD-10-CM | POA: Diagnosis not present

## 2015-01-11 DIAGNOSIS — N3281 Overactive bladder: Secondary | ICD-10-CM

## 2015-01-11 DIAGNOSIS — R5383 Other fatigue: Secondary | ICD-10-CM

## 2015-01-11 DIAGNOSIS — E875 Hyperkalemia: Secondary | ICD-10-CM

## 2015-01-11 LAB — POCT GLYCOSYLATED HEMOGLOBIN (HGB A1C): Hemoglobin A1C: 7.2

## 2015-01-11 NOTE — Patient Instructions (Signed)
Fatigue Fatigue is a feeling of tiredness, lack of energy, lack of motivation, or feeling tired all the time. Having enough rest, good nutrition, and reducing stress will normally reduce fatigue. Consult your caregiver if it persists. The nature of your fatigue will help your caregiver to find out its cause. The treatment is based on the cause.  CAUSES  There are many causes for fatigue. Most of the time, fatigue can be traced to one or more of your habits or routines. Most causes fit into one or more of three general areas. They are: Lifestyle problems  Sleep disturbances.  Overwork.  Physical exertion.  Unhealthy habits.  Poor eating habits or eating disorders.  Alcohol and/or drug use .  Lack of proper nutrition (malnutrition). Psychological problems  Stress and/or anxiety problems.  Depression.  Grief.  Boredom. Medical Problems or Conditions  Anemia.  Pregnancy.  Thyroid gland problems.  Recovery from major surgery.  Continuous pain.  Emphysema or asthma that is not well controlled  Allergic conditions.  Diabetes.  Infections (such as mononucleosis).  Obesity.  Sleep disorders, such as sleep apnea.  Heart failure or other heart-related problems.  Cancer.  Kidney disease.  Liver disease.  Effects of certain medicines such as antihistamines, cough and cold remedies, prescription pain medicines, heart and blood pressure medicines, drugs used for treatment of cancer, and some antidepressants. SYMPTOMS  The symptoms of fatigue include:   Lack of energy.  Lack of drive (motivation).  Drowsiness.  Feeling of indifference to the surroundings. DIAGNOSIS  The details of how you feel help guide your caregiver in finding out what is causing the fatigue. You will be asked about your present and past health condition. It is important to review all medicines that you take, including prescription and non-prescription items. A thorough exam will be done.  You will be questioned about your feelings, habits, and normal lifestyle. Your caregiver may suggest blood tests, urine tests, or other tests to look for common medical causes of fatigue.  TREATMENT  Fatigue is treated by correcting the underlying cause. For example, if you have continuous pain or depression, treating these causes will improve how you feel. Similarly, adjusting the dose of certain medicines will help in reducing fatigue.  HOME CARE INSTRUCTIONS   Try to get the required amount of good sleep every night.  Eat a healthy and nutritious diet, and drink enough water throughout the day.  Practice ways of relaxing (including yoga or meditation).  Exercise regularly.  Make plans to change situations that cause stress. Act on those plans so that stresses decrease over time. Keep your work and personal routine reasonable.  Avoid street drugs and minimize use of alcohol.  Start taking a daily multivitamin after consulting your caregiver. SEEK MEDICAL CARE IF:   You have persistent tiredness, which cannot be accounted for.  You have fever.  You have unintentional weight loss.  You have headaches.  You have disturbed sleep throughout the night.  You are feeling sad.  You have constipation.  You have dry skin.  You have gained weight.  You are taking any new or different medicines that you suspect are causing fatigue.  You are unable to sleep at night.  You develop any unusual swelling of your legs or other parts of your body. SEEK IMMEDIATE MEDICAL CARE IF:   You are feeling confused.  Your vision is blurred.  You feel faint or pass out.  You develop severe headache.  You develop severe abdominal, pelvic, or   back pain.  You develop chest pain, shortness of breath, or an irregular or fast heartbeat.  You are unable to pass a normal amount of urine.  You develop abnormal bleeding such as bleeding from the rectum or you vomit blood.  You have thoughts  about harming yourself or committing suicide.  You are worried that you might harm someone else. MAKE SURE YOU:   Understand these instructions.  Will watch your condition.  Will get help right away if you are not doing well or get worse. Document Released: 03/25/2007 Document Revised: 08/20/2011 Document Reviewed: 09/29/2013 [facility] Patient Information 2015 [place], [place]. This information is not intended to replace advice given to you by your health care provider. Make sure you discuss any questions you have with your health care provider. Health Maintenance Adopting a healthy lifestyle and getting preventive care can go a long way to promote health and wellness. Talk with your health care provider about what schedule of regular examinations is right for you. This is a good chance for you to check in with your provider about disease prevention and staying healthy. In between checkups, there are plenty of things you can do on your own. Experts have done a lot of research about which lifestyle changes and preventive measures are most likely to keep you healthy. Ask your health care provider for more information. WEIGHT AND DIET  Eat a healthy diet  Be sure to include plenty of vegetables, fruits, low-fat dairy products, and lean protein.  Do not eat a lot of foods high in solid fats, added sugars, or salt.  Get regular exercise. This is one of the most important things you can do for your health.  Most adults should exercise for at least 150 minutes each week. The exercise should increase your heart rate and make you sweat (moderate-intensity exercise).  Most adults should also do strengthening exercises at least twice a week. This is in addition to the moderate-intensity exercise.  Maintain a healthy weight  Body mass index (BMI) is a measurement that can be used to identify possible weight problems. It estimates body fat based on height and weight. Your health care provider can help  determine your BMI and help you achieve or maintain a healthy weight.  For females 87 years of age and older:   A BMI below 18.5 is considered underweight.  A BMI of 18.5 to 24.9 is normal.  A BMI of 25 to 29.9 is considered overweight.  A BMI of 30 and above is considered obese.  Watch levels of cholesterol and blood lipids  You should start having your blood tested for lipids and cholesterol at 79 years of age, then have this test every 5 years.  You may need to have your cholesterol levels checked more often if:  Your lipid or cholesterol levels are high.  You are older than 79 years of age.  You are at high risk for heart disease.  CANCER SCREENING   Lung Cancer  Lung cancer screening is recommended for adults 20-50 years old who are at high risk for lung cancer because of a history of smoking.  A yearly low-dose CT scan of the lungs is recommended for people who:  Currently smoke.  Have quit within the past 15 years.  Have at least a 30-pack-year history of smoking. A pack year is smoking an average of one pack of cigarettes a day for 1 year.  Yearly screening should continue until it has been 15 years since you  quit.  Yearly screening should stop if you develop a health problem that would prevent you from having lung cancer treatment.  Breast Cancer  Practice breast self-awareness. This means understanding how your breasts normally appear and feel.  It also means doing regular breast self-exams. Let your health care provider know about any changes, no matter how small.  If you are in your 20s or 30s, you should have a clinical breast exam (CBE) by a health care provider every 1-3 years as part of a regular health exam.  If you are 81 or older, have a CBE every year. Also consider having a breast X-ray (mammogram) every year.  If you have a family history of breast cancer, talk to your health care provider about genetic screening.  If you are at high risk  for breast cancer, talk to your health care provider about having an MRI and a mammogram every year.  Breast cancer gene (BRCA) assessment is recommended for women who have family members with BRCA-related cancers. BRCA-related cancers include:  Breast.  Ovarian.  Tubal.  Peritoneal cancers.  Results of the assessment will determine the need for genetic counseling and BRCA1 and BRCA2 testing. Cervical Cancer Routine pelvic examinations to screen for cervical cancer are no longer recommended for nonpregnant women who are considered low risk for cancer of the pelvic organs (ovaries, uterus, and vagina) and who do not have symptoms. A pelvic examination may be necessary if you have symptoms including those associated with pelvic infections. Ask your health care provider if a screening pelvic exam is right for you.   The Pap test is the screening test for cervical cancer for women who are considered at risk.  If you had a hysterectomy for a problem that was not cancer or a condition that could lead to cancer, then you no longer need Pap tests.  If you are older than 65 years, and you have had normal Pap tests for the past 10 years, you no longer need to have Pap tests.  If you have had past treatment for cervical cancer or a condition that could lead to cancer, you need Pap tests and screening for cancer for at least 20 years after your treatment.  If you no longer get a Pap test, assess your risk factors if they change (such as having a new sexual partner). This can affect whether you should start being screened again.  Some women have medical problems that increase their chance of getting cervical cancer. If this is the case for you, your health care provider may recommend more frequent screening and Pap tests.  The human papillomavirus (HPV) test is another test that may be used for cervical cancer screening. The HPV test looks for the virus that can cause cell changes in the cervix. The  cells collected during the Pap test can be tested for HPV.  The HPV test can be used to screen women 42 years of age and older. Getting tested for HPV can extend the interval between normal Pap tests from three to five years.  An HPV test also should be used to screen women of any age who have unclear Pap test results.  After 79 years of age, women should have HPV testing as often as Pap tests.  Colorectal Cancer  This type of cancer can be detected and often prevented.  Routine colorectal cancer screening usually begins at 79 years of age and continues through 79 years of age.  Your health care provider may recommend  screening at an earlier age if you have risk factors for colon cancer.  Your health care provider may also recommend using home test kits to check for hidden blood in the stool.  A small camera at the end of a tube can be used to examine your colon directly (sigmoidoscopy or colonoscopy). This is done to check for the earliest forms of colorectal cancer.  Routine screening usually begins at age 65.  Direct examination of the colon should be repeated every 5-10 years through 79 years of age. However, you may need to be screened more often if early forms of precancerous polyps or small growths are found. Skin Cancer  Check your skin from head to toe regularly.  Tell your health care provider about any new moles or changes in moles, especially if there is a change in a mole's shape or color.  Also tell your health care provider if you have a mole that is larger than the size of a pencil eraser.  Always use sunscreen. Apply sunscreen liberally and repeatedly throughout the day.  Protect yourself by wearing long sleeves, pants, a wide-brimmed hat, and sunglasses whenever you are outside. HEART DISEASE, DIABETES, AND HIGH BLOOD PRESSURE   Have your blood pressure checked at least every 1-2 years. High blood pressure causes heart disease and increases the risk of  stroke.  If you are between 16 years and 36 years old, ask your health care provider if you should take aspirin to prevent strokes.  Have regular diabetes screenings. This involves taking a blood sample to check your fasting blood sugar level.  If you are at a normal weight and have a low risk for diabetes, have this test once every three years after 79 years of age.  If you are overweight and have a high risk for diabetes, consider being tested at a younger age or more often. PREVENTING INFECTION  Hepatitis B  If you have a higher risk for hepatitis B, you should be screened for this virus. You are considered at high risk for hepatitis B if:  You were born in a country where hepatitis B is common. Ask your health care provider which countries are considered high risk.  Your parents were born in a high-risk country, and you have not been immunized against hepatitis B (hepatitis B vaccine).  You have HIV or AIDS.  You use needles to inject street drugs.  You live with someone who has hepatitis B.  You have had sex with someone who has hepatitis B.  You get hemodialysis treatment.  You take certain medicines for conditions, including cancer, organ transplantation, and autoimmune conditions. Hepatitis C  Blood testing is recommended for:  Everyone born from [birth year] through 1963.  Anyone with known risk factors for hepatitis C. Sexually transmitted infections (STIs)  You should be screened for sexually transmitted infections (STIs) including gonorrhea and chlamydia if:  You are sexually active and are younger than 79 years of age.  You are older than 79 years of age and your health care provider tells you that you are at risk for this type of infection.  Your sexual activity has changed since you were last screened and you are at an increased risk for chlamydia or gonorrhea. Ask your health care provider if you are at risk.  If you do not have HIV, but are at risk, it may be  recommended that you take a prescription medicine daily to prevent HIV infection. This is called pre-exposure prophylaxis (PrEP). You are  considered at risk if:  You are sexually active and do not regularly use condoms or know the HIV status of your partner(s).  You take drugs by injection.  You are sexually active with a partner who has HIV. Talk with your health care provider about whether you are at high risk of being infected with HIV. If you choose to begin PrEP, you should first be tested for HIV. You should then be tested every 3 months for as long as you are taking PrEP.  PREGNANCY   If you are premenopausal and you may become pregnant, ask your health care provider about preconception counseling.  If you may become pregnant, take 400 to 800 micrograms (mcg) of folic acid every day.  If you want to prevent pregnancy, talk to your health care provider about birth control (contraception). OSTEOPOROSIS AND MENOPAUSE   Osteoporosis is a disease in which the bones lose minerals and strength with aging. This can result in serious bone fractures. Your risk for osteoporosis can be identified using a bone density scan.  If you are 7 years of age or older, or if you are at risk for osteoporosis and fractures, ask your health care provider if you should be screened.  Ask your health care provider whether you should take a calcium or vitamin D supplement to lower your risk for osteoporosis.  Menopause may have certain physical symptoms and risks.  Hormone replacement therapy may reduce some of these symptoms and risks. Talk to your health care provider about whether hormone replacement therapy is right for you.  HOME CARE INSTRUCTIONS   Schedule regular health, dental, and eye exams.  Stay current with your immunizations.   Do not use any tobacco products including cigarettes, chewing tobacco, or electronic cigarettes.  If you are pregnant, do not drink alcohol.  If you are  breastfeeding, limit how much and how often you drink alcohol.  Limit alcohol intake to no more than 1 drink per day for nonpregnant women. One drink equals 12 ounces of beer, 5 ounces of wine, or 1 ounces of hard liquor.  Do not use street drugs.  Do not share needles.  Ask your health care provider for help if you need support or information about quitting drugs.  Tell your health care provider if you often feel depressed.  Tell your health care provider if you have ever been abused or do not feel safe at home. Document Released: 12/11/2010 Document Revised: 10/12/2013 Document Reviewed: 04/29/2013 [facility] Patient Information 2015 [place], [place]. This information is not intended to replace advice given to you by your health care provider. Make sure you discuss any questions you have with your health care provider.

## 2015-01-11 NOTE — Telephone Encounter (Signed)
Last seen 07/19/14 [name] [name]  If approved print 

## 2015-01-11 NOTE — Progress Notes (Signed)
Subjective:    Patient ID: [name] [name], female    DOB: May 23, 1933, 81 y.o.   MRN: [id number]  Pt presents to the office for chronic follow up. Pt complaining of fatigue today. 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 Pt has pacemaker and is followed by her Cardiologist every 6 months. Leg Pain   Diabetes She presents for her follow-up diabetic visit. She has type 2 diabetes mellitus. Her disease course has been fluctuating. Hypoglycemia symptoms include nervousness/anxiousness. Pertinent negatives for hypoglycemia include no confusion, dizziness, headaches or hunger. Associated symptoms include fatigue and foot paresthesias. Pertinent negatives for diabetes include no blurred vision and no visual change. Pertinent negatives for hypoglycemia complications include no blackouts and no hospitalization. Symptoms are worsening. Diabetic complications include peripheral neuropathy. Pertinent negatives for diabetic complications include no CVA, heart disease or nephropathy. Risk factors for coronary artery disease include diabetes mellitus, dyslipidemia, hypertension, obesity, post-menopausal and family history. Current diabetic treatment includes insulin injections and oral agent (dual therapy). She is following a generally unhealthy diet. Her breakfast blood glucose range is generally 110-130 mg/dl. An ACE inhibitor/angiotensin II receptor blocker is being taken. Eye exam is not current.  Hyperlipidemia This is a chronic problem. The current episode started more than 1 year ago. The problem is controlled. Recent lipid tests were reviewed and are normal. Exacerbating diseases include diabetes and hypothyroidism. Associated symptoms include leg pain and shortness of breath (At times). Pertinent negatives include no myalgias. Current antihyperlipidemic treatment includes statins. The current treatment provides significant improvement of lipids. Risk factors for coronary artery disease include  diabetes mellitus, dyslipidemia, hypertension, obesity and post-menopausal.  Hypertension This is a chronic problem. The current episode started more than 1 year ago. The problem has been resolved since onset. The problem is controlled. Associated symptoms include anxiety, peripheral edema and shortness of breath (At times). Pertinent negatives include no blurred vision, headaches or palpitations. Risk factors for coronary artery disease include diabetes mellitus, dyslipidemia, obesity and post-menopausal state. Past treatments include ACE inhibitors, beta blockers and calcium channel blockers. The current treatment provides significant improvement. Hypertensive end-organ damage includes a thyroid problem. There is no history of kidney disease, CAD/MI, CVA or heart failure. There is no history of sleep apnea.  Anxiety Presents for follow-up visit. Symptoms include insomnia, nervous/anxious behavior and shortness of breath (At times). Patient reports no confusion, depressed mood, dizziness, excessive worry, irritability, nausea or palpitations. Symptoms occur constantly. The symptoms are aggravated by family issues. The quality of sleep is fair. Nighttime awakenings: occasional.   Her past medical history is significant for anxiety/panic attacks and CAD. There is no history of depression. Past treatments include benzodiazephines.  Gastrophageal Reflux She reports no belching, no coughing, no heartburn, no nausea or no sore throat. This is a chronic problem. The current episode started more than 1 year ago. The problem occurs rarely. The problem has been resolved. The symptoms are aggravated by certain foods and lying down. Associated symptoms include fatigue. Risk factors include obesity. She has tried a PPI for the symptoms. The treatment provided significant relief.  Thyroid Problem Presents for follow-up visit. Symptoms include anxiety and fatigue. Patient reports no constipation, depressed mood,  diarrhea, palpitations or visual change. The symptoms have been stable. Past treatments include levothyroxine. The treatment provided significant relief. Her past medical history is significant for diabetes and hyperlipidemia. There is no history of heart failure.      Review of Systems  Constitutional: Positive for fatigue. Negative  for irritability.  HENT: Negative.  Negative for sore throat.   Eyes: Negative.  Negative for blurred vision.  Respiratory: Positive for shortness of breath (At times). Negative for cough.   Cardiovascular: Negative.  Negative for palpitations.  Gastrointestinal: Negative.  Negative for heartburn, nausea, diarrhea and constipation.  Endocrine: Negative.   Genitourinary: Negative.   Musculoskeletal: Negative.  Negative for myalgias.  Neurological: Negative.  Negative for dizziness and headaches.  Hematological: Negative.   Psychiatric/Behavioral: Negative for confusion. The patient is nervous/anxious and has insomnia.   All other systems reviewed and are negative.      Objective:   Physical Exam  Constitutional: She is oriented to person, place, and time. She appears well-developed and well-nourished. No distress.  HENT:  Head: Normocephalic and atraumatic.  Right Ear: External ear normal.  Left Ear: External ear normal.  Nose: Nose normal.  Mouth/Throat: Oropharynx is clear and moist.  Eyes: Pupils are equal, round, and reactive to light.  Neck: Normal range of motion. Neck supple. No thyromegaly present.  Cardiovascular: Normal rate, regular rhythm, normal heart sounds and intact distal pulses.   No murmur heard. Pulmonary/Chest: Effort normal and breath sounds normal. No respiratory distress. She has no wheezes.  Abdominal: Soft. Bowel sounds are normal. She exhibits no distension. There is no tenderness.  Musculoskeletal: Normal range of motion. She exhibits no edema or tenderness.  Neurological: She is alert and oriented to person, place, and  time. She has normal reflexes. No cranial nerve deficit.  Skin: Skin is warm and dry.  Psychiatric: She has a normal mood and affect. Her behavior is normal. Judgment and thought content normal.  Vitals reviewed.     BP 126/68 mmHg  Pulse 60  Temp(Src) 96 F (35.6 C) (Oral)  Ht '5\' 10"'  (1.778 m)  Wt 237 lb 6.4 oz (107.684 kg)  BMI 34.06 kg/m2     Assessment & Plan:  1. Essential hypertension - CMP14+EGFR  2. Chronic obstructive pulmonary disease, unspecified COPD, unspecified chronic bronchitis type - CMP14+EGFR  3. Chronic diastolic heart failure - PXT06+YIRS  4. Vitamin B 12 deficiency - CMP14+EGFR  5. Gastroesophageal reflux disease, esophagitis presence not specified - CMP14+EGFR  6. Hypothyroidism, unspecified hypothyroidism type - CMP14+EGFR - Thyroid Panel With TSH  7. Type 2 diabetes mellitus with hyperglycemia - POCT glycosylated hemoglobin (Hb A1C) - CMP14+EGFR  8. Peripheral neuropathy - CMP14+EGFR  9. Osteoarthrosis, unspecified whether generalized or localized, involving lower leg - CMP14+EGFR  10. OAB (overactive bladder) - CMP14+EGFR  11. Hyperlipidemia - CMP14+EGFR - Lipid panel  12. Obesity - CMP14+EGFR  13. GAD (generalized anxiety disorder) - CMP14+EGFR  14. Insomnia - CMP14+EGFR  15. Other fatigue - Anemia Profile B   Continue all meds Labs pending Health Maintenance reviewed Diet and exercise encouraged RTO 3 months   Evelina Dun, FNP

## 2015-01-11 NOTE — Addendum Note (Signed)
Addended by: Quay Burow on: 01/11/2015 03:32 PM   Modules accepted: Orders

## 2015-01-11 NOTE — Telephone Encounter (Signed)
RX ready for pick up 

## 2015-01-12 ENCOUNTER — Telehealth: Payer: Self-pay | Admitting: Family

## 2015-01-12 ENCOUNTER — Other Ambulatory Visit: Payer: Self-pay | Admitting: Family

## 2015-01-12 ENCOUNTER — Other Ambulatory Visit: Payer: Self-pay | Admitting: *Deleted

## 2015-01-12 DIAGNOSIS — E875 Hyperkalemia: Secondary | ICD-10-CM

## 2015-01-12 LAB — ANEMIA PROFILE B
BASOS ABS: 0 10*3/uL (ref 0.0–0.2)
Basos: 0 %
EOS (ABSOLUTE): 0.2 10*3/uL (ref 0.0–0.4)
Eos: 2 %
Ferritin: 135 ng/mL (ref 15–150)
HEMOGLOBIN: 14.3 g/dL (ref 11.1–15.9)
Hematocrit: 43.6 % (ref 34.0–46.6)
IMMATURE GRANS (ABS): 0 10*3/uL (ref 0.0–0.1)
IRON SATURATION: 26 % (ref 15–55)
Immature Granulocytes: 0 %
Iron: 86 ug/dL (ref 27–139)
Lymphocytes Absolute: 3 10*3/uL (ref 0.7–3.1)
Lymphs: 34 %
MCH: 30 pg (ref 26.6–33.0)
MCHC: 32.8 g/dL (ref 31.5–35.7)
MCV: 92 fL (ref 79–97)
MONOCYTES: 7 %
MONOS ABS: 0.6 10*3/uL (ref 0.1–0.9)
Neutrophils Absolute: 5 10*3/uL (ref 1.4–7.0)
Neutrophils: 57 %
Platelets: 268 10*3/uL (ref 150–379)
RBC: 4.76 x10E6/uL (ref 3.77–5.28)
RDW: 13 % (ref 12.3–15.4)
Retic Ct Pct: 1.7 % (ref 0.6–2.6)
Total Iron Binding Capacity: 330 ug/dL (ref 250–450)
UIBC: 244 ug/dL (ref 118–369)
VITAMIN B 12: 235 pg/mL (ref 211–946)
WBC: 8.8 10*3/uL (ref 3.4–10.8)

## 2015-01-12 LAB — LIPID PANEL
CHOL/HDL RATIO: 3.3 ratio (ref 0.0–4.4)
Cholesterol, Total: 150 mg/dL (ref 100–199)
HDL: 45 mg/dL (ref >39)
LDL CALC: 75 mg/dL (ref 0–99)
Triglycerides: 148 mg/dL (ref 0–149)
VLDL CHOLESTEROL CAL: 30 mg/dL (ref 5–40)

## 2015-01-12 LAB — CMP14+EGFR
ALBUMIN: 4.3 g/dL (ref 3.5–4.7)
ALT: 22 IU/L (ref 0–32)
AST: 26 IU/L (ref 0–40)
Albumin/Globulin Ratio: 1.6 (ref 1.1–2.5)
Alkaline Phosphatase: 62 IU/L (ref 39–117)
BILIRUBIN TOTAL: 0.5 mg/dL (ref 0.0–1.2)
BUN / CREAT RATIO: 11 (ref 11–26)
BUN: 16 mg/dL (ref 8–27)
CHLORIDE: 95 mmol/L — AB (ref 97–108)
CO2: 25 mmol/L (ref 18–29)
Calcium: 10.2 mg/dL (ref 8.7–10.3)
Creatinine, Ser: 1.4 mg/dL — ABNORMAL HIGH (ref 0.57–1.00)
GFR, EST AFRICAN AMERICAN: 41 mL/min/{1.73_m2} — AB (ref >59)
GFR, EST NON AFRICAN AMERICAN: 36 mL/min/{1.73_m2} — AB (ref >59)
GLOBULIN, TOTAL: 2.7 g/dL (ref 1.5–4.5)
Glucose: 122 mg/dL — ABNORMAL HIGH (ref 65–99)
Potassium: 5.4 mmol/L — ABNORMAL HIGH (ref 3.5–5.2)
Sodium: 137 mmol/L (ref 134–144)
Total Protein: 7 g/dL (ref 6.0–8.5)

## 2015-01-12 LAB — THYROID PANEL WITH TSH
Free Thyroxine Index: 2.3 (ref 1.2–4.9)
T3 Uptake Ratio: 26 % (ref 24–39)
T4 TOTAL: 8.8 ug/dL (ref 4.5–12.0)
TSH: 2.95 u[IU]/mL (ref 0.450–4.500)

## 2015-01-12 MED ORDER — NYSTATIN 100000 UNIT/GM EX POWD: CUTANEOUS | Status: DC

## 2015-01-12 NOTE — Telephone Encounter (Signed)
Refill sent pharmacy. 

## 2015-01-12 NOTE — Telephone Encounter (Signed)
-----   Message from Junie Spencer, FNP sent at 01/12/2015  9:29 AM EDT ----- hgbA1C slightly elevated- Pt needs to be on low carb  liver function stable Creatinine elevated- No NSAID- Pt needs BMP redrawn- Orders in EPIC Potassium elevated- Stop any OTC supplements-Pt needs BMP redrawn- Orders in EPIC Cholesterol levels WNL Thyroid levels WNL Anemia Profile (Iron levels, Vitamin B12, Folate, WBC, Hgb, & Plts)- WNL

## 2015-01-12 NOTE — Telephone Encounter (Signed)
Please call to discuss lab results 

## 2015-01-13 ENCOUNTER — Other Ambulatory Visit (INDEPENDENT_AMBULATORY_CARE_PROVIDER_SITE_OTHER): Payer: Medicare Other

## 2015-01-13 ENCOUNTER — Telehealth: Payer: Self-pay

## 2015-01-13 DIAGNOSIS — E875 Hyperkalemia: Secondary | ICD-10-CM | POA: Diagnosis not present

## 2015-01-13 DIAGNOSIS — E1165 Type 2 diabetes mellitus with hyperglycemia: Secondary | ICD-10-CM | POA: Diagnosis not present

## 2015-01-13 DIAGNOSIS — I10 Essential (primary) hypertension: Secondary | ICD-10-CM | POA: Diagnosis not present

## 2015-01-13 NOTE — Progress Notes (Signed)
Lab only 

## 2015-01-13 NOTE — Telephone Encounter (Signed)
Insurance denied prior authorization for Cyclobenzaprine HCL  Must first try all of the following Celecoxib, Diflunisal, Etodolac, Ketoprofen, Nabumetone, Naprosyn regular/delayed release and Tizanidine or why the specific reason the alternatives are not appropriate to treat your patient

## 2015-01-13 NOTE — Addendum Note (Signed)
Addended by: Orma Render F on: 01/13/2015 05:16 PM   Modules accepted: Orders

## 2015-01-14 LAB — BMP8+EGFR
BUN/Creatinine Ratio: 14 (ref 11–26)
BUN: 11 mg/dL (ref 8–27)
CHLORIDE: 96 mmol/L — AB (ref 97–108)
CO2: 23 mmol/L (ref 18–29)
CREATININE: 0.8 mg/dL (ref 0.57–1.00)
Calcium: 9.4 mg/dL (ref 8.7–10.3)
GFR calc Af Amer: 81 mL/min/{1.73_m2} (ref >59)
GFR, EST NON AFRICAN AMERICAN: 70 mL/min/{1.73_m2} (ref >59)
GLUCOSE: 191 mg/dL — AB (ref 65–99)
POTASSIUM: 5 mmol/L (ref 3.5–5.2)
Sodium: 136 mmol/L (ref 134–144)

## 2015-01-17 ENCOUNTER — Telehealth: Payer: Self-pay | Admitting: Family

## 2015-01-17 ENCOUNTER — Ambulatory Visit: Payer: Medicare HMO | Admitting: Family

## 2015-01-17 ENCOUNTER — Other Ambulatory Visit: Payer: Self-pay | Admitting: Family Medicine

## 2015-01-17 MED ORDER — TIZANIDINE HCL 4 MG PO CAPS: 4.0000 mg | ORAL_CAPSULE | Freq: Three times a day (TID) | ORAL | Status: DC

## 2015-01-17 NOTE — Telephone Encounter (Signed)
Insurance denied flexeril. Zanaflex 4 mg Prescription sent to pharmacy

## 2015-01-17 NOTE — Telephone Encounter (Signed)
Patient aware and verbalizes understanding. 

## 2015-01-24 ENCOUNTER — Other Ambulatory Visit: Payer: Self-pay | Admitting: Family

## 2015-01-24 NOTE — Telephone Encounter (Signed)
Last seen 01/11/15  [name] [name]  If approved route to nurse to call into [facility] 

## 2015-01-27 ENCOUNTER — Encounter: Payer: Self-pay | Admitting: Pharmacist

## 2015-01-27 ENCOUNTER — Other Ambulatory Visit: Payer: Self-pay | Admitting: Family

## 2015-01-27 ENCOUNTER — Ambulatory Visit (INDEPENDENT_AMBULATORY_CARE_PROVIDER_SITE_OTHER): Payer: Medicare Other | Admitting: Pharmacist

## 2015-01-27 VITALS — BP 128/60 | HR 74 | Ht 70.0 in | Wt 241.0 lb

## 2015-01-27 DIAGNOSIS — E1142 Type 2 diabetes mellitus with diabetic polyneuropathy: Secondary | ICD-10-CM

## 2015-01-27 DIAGNOSIS — E785 Hyperlipidemia, unspecified: Secondary | ICD-10-CM

## 2015-01-27 DIAGNOSIS — E1140 Type 2 diabetes mellitus with diabetic neuropathy, unspecified: Secondary | ICD-10-CM

## 2015-01-27 DIAGNOSIS — E669 Obesity, unspecified: Secondary | ICD-10-CM | POA: Diagnosis not present

## 2015-01-27 DIAGNOSIS — I10 Essential (primary) hypertension: Secondary | ICD-10-CM | POA: Diagnosis not present

## 2015-01-27 MED ORDER — HYDROCODONE-ACETAMINOPHEN 5-325 MG PO TABS: ORAL_TABLET | ORAL | Status: DC

## 2015-01-27 MED ORDER — KEFLEX 500 MG PO CAPS: 500.0000 mg | ORAL_CAPSULE | Freq: Three times a day (TID) | ORAL | Status: DC

## 2015-01-27 NOTE — Progress Notes (Signed)
Patient ID: [name] [name], female   DOB: 04/17/1933, 81 y.o.   MRN: [id number] Subjective:    [name] [name] is a 81 y.o. female who presents for reevaluation of Type 2 diabetes mellitus. Current medications for diabetes includes:  Humulin NPH 10 units qd (has decreased over last 6 months from 20units bid) glimepride  1 tablet daily and metformin XR  1 tablet bid.   Known diabetic complications: peripheral neuropathy and cardiovascular disease Cardiovascular risk factors: advanced age (older than 89 for men, 56 for women), diabetes mellitus, dyslipidemia, family history of premature cardiovascular disease, hypertension, obesity (BMI >= 30 kg/m2) and sedentary lifestyle  Eye exam current (within one year): unknown Weight trend: has decreased by 10# since our first visit 06/24/2014 Prior visit with dietician: no Current diet: has improved but patient admits to times of high CHO intake Current exercise: no regular exercise  Patient checks BG 1-2 times daily.  Lowest 96 and highest 205 (per glucometer).  Patient was previously having frequently nocturnal hypoglycemia but has not experienced any hypoglycemia in the last 60 days.  7 day avg = 129; 14 day avg = 130; 30 day avg = 136; 90 days avg = 139.  Is She on ACE inhibitor or angiotensin II receptor blocker?  Yes The following portions of the patient's history were reviewed and updated as appropriate: allergies, current medications, past family history, past medical history, past social history, past surgical history and problem list.  Review of Systems Constitutional: negative except for fatigue Cardiovascular: negative except for fatigue, lower extremity edema and 1+ edema bilaterally Integument/breast: negative except for rash Behavioral/Psych: negative except for good mood Endocrine: negative except for diabetic symptoms including increased fatigue    Objective:    BP 128/60 mmHg  Pulse 74  Ht  (1.778 m)  Wt 241 lb  (109.317 kg)  BMI 34.58 kg/m2  Positive LEE bilaterally - 1+ right > left (take furosemide as needed - but hasn't taken today)  Lab Review GLUCOSE (mg/dL)  Date Value  13/01/6577 191*  01/11/2015 122*  10/20/2014 181*   GLUCOSE, BLD (mg/dL)  Date Value  46/96/2952 153*  06/20/2012 146*  12/04/2010 111*   CO2 (mmol/L)  Date Value  01/13/2015 23  01/11/2015 25  10/20/2014 27   BUN (mg/dL)  Date Value  84/13/2440 11  01/11/2015 16  10/20/2014 8  12/04/2012 12  06/20/2012 16  12/04/2010 12   CREAT (mg/dL)  Date Value  04/07/2535 0.88  06/20/2012 0.95  12/04/2010 1.05   CREATININE, SER (mg/dL)  Date Value  64/40/3474 0.80  01/11/2015 1.40*  10/20/2014 0.80    Last A1c = 7.2% (01/10/2015) down from 8.4% (10/2014)  Assessment:    Diabetes Mellitus type II, under improved control.   Edema - stable HTN - BP at goal and patient is taking AEC-I Hyperlipidemia - controlled with current medication Obesity  Plan:    1.  Rx changes:   Continue Insulin NPH 10 units BID  Continue metformin to - take 1 tablet BID with food-  Continue glimepiride to  1 tablet daily   2.  Education: Reviewed 'ABCs' of diabetes management and treatment goals (respective goals in parentheses):  A1C (<7), blood pressure (<130/80), and cholesterol (LDL <100). 3.  Discussed increase physical activity.  Limited due to knee pain.  Gave patient handout about chair exercises and reviewed with her.  Encourage to do daily.   4.  RTC in 2 months to see PCP;  Follow up with  CDE / me in 4 months     Henrene Pastor, PharmD, CPP, CDE

## 2015-01-31 ENCOUNTER — Telehealth: Payer: Self-pay | Admitting: Family

## 2015-01-31 NOTE — Telephone Encounter (Signed)
Notes during pt's annual wellness visit stated that mammogram was scheduled for 09/07/14, she knows that she was at her grandson's at that time and had to cancel appt, before that her last noted mammogram was 08/07/10 at [facility]. She would like to be scheduled with the mobile unit that comes to our office, this call was transferred to the ladies that have this schedule.

## 2015-02-03 ENCOUNTER — Ambulatory Visit (INDEPENDENT_AMBULATORY_CARE_PROVIDER_SITE_OTHER): Payer: Medicare Other | Admitting: *Deleted

## 2015-02-03 DIAGNOSIS — I495 Sick sinus syndrome: Secondary | ICD-10-CM | POA: Diagnosis not present

## 2015-02-03 LAB — CUP PACEART INCLINIC DEVICE CHECK
Battery Remaining Longevity: 67 mo
Battery Voltage: 2.77 V
Date Time Interrogation Session: 20160825114909
Lead Channel Pacing Threshold Amplitude: 0.5 V
Lead Channel Pacing Threshold Pulse Width: 0.4 ms
Lead Channel Setting Pacing Amplitude: 2.5 V
MDC IDC MSMT BATTERY IMPEDANCE: 601 Ohm
MDC IDC MSMT LEADCHNL RA IMPEDANCE VALUE: 448 Ohm
MDC IDC MSMT LEADCHNL RA PACING THRESHOLD AMPLITUDE: 0.5 V
MDC IDC MSMT LEADCHNL RA PACING THRESHOLD PULSEWIDTH: 0.4 ms
MDC IDC MSMT LEADCHNL RA SENSING INTR AMPL: 4 mV
MDC IDC MSMT LEADCHNL RV IMPEDANCE VALUE: 653 Ohm
MDC IDC SET LEADCHNL RA PACING AMPLITUDE: 2 V
MDC IDC SET LEADCHNL RV PACING PULSEWIDTH: 0.4 ms
MDC IDC SET LEADCHNL RV SENSING SENSITIVITY: 2.8 mV
MDC IDC STAT BRADY AP VP PERCENT: 80 %
MDC IDC STAT BRADY AP VS PERCENT: 0 %
MDC IDC STAT BRADY AS VP PERCENT: 20 %
MDC IDC STAT BRADY AS VS PERCENT: 0 %

## 2015-02-03 NOTE — Progress Notes (Signed)
Pacemaker check in clinic. Normal device function. Thresholds, sensing, impedances consistent with previous measurements. Device programmed to maximize longevity. (1) mode switch---<30 sec, no EGMs. No high ventricular rates noted. Device programmed at appropriate safety margins. Histogram distribution appropriate for patient activity level. Device programmed to optimize intrinsic conduction. Estimated longevity 5.5 years. Patient will follow up with GT/R in 07-2015.

## 2015-02-07 DIAGNOSIS — Z1231 Encounter for screening mammogram for malignant neoplasm of breast: Secondary | ICD-10-CM | POA: Diagnosis not present

## 2015-02-16 ENCOUNTER — Encounter: Payer: Medicare Other | Admitting: Internal Medicine

## 2015-02-17 ENCOUNTER — Other Ambulatory Visit: Payer: Self-pay | Admitting: Family Medicine

## 2015-02-22 ENCOUNTER — Telehealth: Payer: Self-pay | Admitting: Family

## 2015-02-22 ENCOUNTER — Other Ambulatory Visit: Payer: Self-pay | Admitting: Family

## 2015-02-22 NOTE — Telephone Encounter (Signed)
Multiple attempts to call; Number busy; if pt calls back, mammogram is Normal-repeat in 1 year.  We do not have the official results yet, but she should be receiving a letter in the mail with them

## 2015-02-22 NOTE — Telephone Encounter (Signed)
Pt aware of mammo results.  

## 2015-03-10 ENCOUNTER — Telehealth: Payer: Self-pay | Admitting: Family

## 2015-03-10 MED ORDER — HYDROCODONE-ACETAMINOPHEN 5-325 MG PO TABS: ORAL_TABLET | ORAL | Status: DC

## 2015-03-10 NOTE — Telephone Encounter (Signed)
Go ahead and do refill. If needs change in the future will need to discuss further with Lorene Dy

## 2015-03-10 NOTE — Telephone Encounter (Signed)
Aware, hydrocodone script ready. 

## 2015-03-21 ENCOUNTER — Other Ambulatory Visit: Payer: Self-pay | Admitting: Family

## 2015-03-22 NOTE — Telephone Encounter (Signed)
Last seen 01/11/15  Neysa Bonito  If approved route to nurse to call into [facility]

## 2015-03-23 ENCOUNTER — Other Ambulatory Visit: Payer: Self-pay | Admitting: Family

## 2015-04-08 ENCOUNTER — Telehealth: Payer: Self-pay | Admitting: Family

## 2015-04-08 ENCOUNTER — Telehealth: Payer: Self-pay | Admitting: *Deleted

## 2015-04-08 MED ORDER — HYDROCODONE-ACETAMINOPHEN 5-325 MG PO TABS: ORAL_TABLET | ORAL | Status: DC

## 2015-04-08 NOTE — Telephone Encounter (Signed)
LM,hydrocodone script is ready for patient to come to office for.

## 2015-04-08 NOTE — Telephone Encounter (Signed)
RX ready for pick up 

## 2015-04-09 ENCOUNTER — Other Ambulatory Visit: Payer: Self-pay | Admitting: Family

## 2015-04-09 ENCOUNTER — Other Ambulatory Visit: Payer: Self-pay | Admitting: Pharmacist

## 2015-04-11 ENCOUNTER — Other Ambulatory Visit: Payer: Self-pay | Admitting: Family

## 2015-04-18 ENCOUNTER — Other Ambulatory Visit: Payer: Self-pay | Admitting: Family Medicine

## 2015-04-28 ENCOUNTER — Ambulatory Visit: Payer: Self-pay | Admitting: Family

## 2015-05-02 ENCOUNTER — Encounter: Payer: Self-pay | Admitting: Family

## 2015-05-02 ENCOUNTER — Ambulatory Visit (INDEPENDENT_AMBULATORY_CARE_PROVIDER_SITE_OTHER): Payer: Medicare Other

## 2015-05-02 ENCOUNTER — Ambulatory Visit (INDEPENDENT_AMBULATORY_CARE_PROVIDER_SITE_OTHER): Payer: Medicare Other | Admitting: Family

## 2015-05-02 VITALS — BP 128/70 | HR 60 | Temp 96.7°F | Ht 70.0 in | Wt 236.0 lb

## 2015-05-02 DIAGNOSIS — N3281 Overactive bladder: Secondary | ICD-10-CM | POA: Diagnosis not present

## 2015-05-02 DIAGNOSIS — M1710 Unilateral primary osteoarthritis, unspecified knee: Secondary | ICD-10-CM

## 2015-05-02 DIAGNOSIS — E1143 Type 2 diabetes mellitus with diabetic autonomic (poly)neuropathy: Secondary | ICD-10-CM | POA: Diagnosis not present

## 2015-05-02 DIAGNOSIS — E1165 Type 2 diabetes mellitus with hyperglycemia: Secondary | ICD-10-CM | POA: Diagnosis not present

## 2015-05-02 DIAGNOSIS — E785 Hyperlipidemia, unspecified: Secondary | ICD-10-CM

## 2015-05-02 DIAGNOSIS — K219 Gastro-esophageal reflux disease without esophagitis: Secondary | ICD-10-CM

## 2015-05-02 DIAGNOSIS — M25512 Pain in left shoulder: Secondary | ICD-10-CM

## 2015-05-02 DIAGNOSIS — Z23 Encounter for immunization: Secondary | ICD-10-CM

## 2015-05-02 DIAGNOSIS — G6289 Other specified polyneuropathies: Secondary | ICD-10-CM

## 2015-05-02 DIAGNOSIS — Z794 Long term (current) use of insulin: Secondary | ICD-10-CM | POA: Diagnosis not present

## 2015-05-02 DIAGNOSIS — E538 Deficiency of other specified B group vitamins: Secondary | ICD-10-CM | POA: Diagnosis not present

## 2015-05-02 DIAGNOSIS — I10 Essential (primary) hypertension: Secondary | ICD-10-CM | POA: Diagnosis not present

## 2015-05-02 DIAGNOSIS — E669 Obesity, unspecified: Secondary | ICD-10-CM

## 2015-05-02 DIAGNOSIS — G4700 Insomnia, unspecified: Secondary | ICD-10-CM

## 2015-05-02 DIAGNOSIS — I679 Cerebrovascular disease, unspecified: Secondary | ICD-10-CM | POA: Diagnosis not present

## 2015-05-02 DIAGNOSIS — F411 Generalized anxiety disorder: Secondary | ICD-10-CM

## 2015-05-02 DIAGNOSIS — M179 Osteoarthritis of knee, unspecified: Secondary | ICD-10-CM

## 2015-05-02 DIAGNOSIS — IMO0002 Reserved for concepts with insufficient information to code with codable children: Secondary | ICD-10-CM

## 2015-05-02 DIAGNOSIS — E039 Hypothyroidism, unspecified: Secondary | ICD-10-CM

## 2015-05-02 DIAGNOSIS — J449 Chronic obstructive pulmonary disease, unspecified: Secondary | ICD-10-CM | POA: Diagnosis not present

## 2015-05-02 DIAGNOSIS — I5032 Chronic diastolic (congestive) heart failure: Secondary | ICD-10-CM | POA: Diagnosis not present

## 2015-05-02 LAB — POCT GLYCOSYLATED HEMOGLOBIN (HGB A1C): Hemoglobin A1C: 7

## 2015-05-02 MED ORDER — HYDROCODONE-ACETAMINOPHEN 5-325 MG PO TABS: ORAL_TABLET | ORAL | Status: DC

## 2015-05-02 NOTE — Progress Notes (Signed)
Subjective:    Patient ID: [name] [name], female    DOB: Apr 12, 1933, 81 y.o.   MRN: [id number]  Pt presents to the office today for chronic follow up. Pt has pacemaker and is followed by her Cardiologist every 6 months. Diabetes She presents for her follow-up diabetic visit. She has type 2 diabetes mellitus. Her disease course has been fluctuating. Hypoglycemia symptoms include nervousness/anxiousness. Pertinent negatives for hypoglycemia include no confusion, dizziness, headaches or hunger. Associated symptoms include fatigue and foot paresthesias. Pertinent negatives for diabetes include no blurred vision and no visual change. Pertinent negatives for hypoglycemia complications include no blackouts and no hospitalization. Symptoms are worsening. Diabetic complications include peripheral neuropathy. Pertinent negatives for diabetic complications include no CVA, heart disease or nephropathy. Risk factors for coronary artery disease include diabetes mellitus, dyslipidemia, hypertension, obesity, post-menopausal and family history. Current diabetic treatment includes insulin injections and oral agent (dual therapy). She is following a generally unhealthy diet. Her breakfast blood glucose range is generally 110-130 mg/dl. An ACE inhibitor/angiotensin II receptor blocker is being taken. Eye exam is not current.  Hypertension This is a chronic problem. The current episode started more than 1 year ago. The problem has been resolved since onset. The problem is controlled. Associated symptoms include anxiety, peripheral edema and shortness of breath (At times). Pertinent negatives include no blurred vision, headaches or palpitations. Risk factors for coronary artery disease include diabetes mellitus, dyslipidemia, obesity and post-menopausal state. Past treatments include ACE inhibitors, beta blockers and calcium channel blockers. The current treatment provides significant improvement. Hypertensive end-organ damage  includes a thyroid problem. There is no history of kidney disease, CAD/MI, CVA or heart failure. There is no history of sleep apnea.  Hyperlipidemia This is a chronic problem. The current episode started more than 1 year ago. The problem is controlled. Recent lipid tests were reviewed and are normal. Exacerbating diseases include diabetes and hypothyroidism. Associated symptoms include shortness of breath (At times). Pertinent negatives include no myalgias. Current antihyperlipidemic treatment includes statins. The current treatment provides significant improvement of lipids. Risk factors for coronary artery disease include diabetes mellitus, dyslipidemia, hypertension, obesity and post-menopausal.  Anxiety Presents for follow-up visit. Onset was more than 5 years ago. The problem has been waxing and waning. Symptoms include insomnia, nervous/anxious behavior and shortness of breath (At times). Patient reports no confusion, depressed mood, dizziness, excessive worry, irritability, nausea or palpitations. Symptoms occur constantly. The symptoms are aggravated by family issues. The quality of sleep is fair. Nighttime awakenings: occasional.   Her past medical history is significant for anxiety/panic attacks and CAD. There is no history of depression. Past treatments include benzodiazephines.  Gastroesophageal Reflux She reports no belching, no coughing, no heartburn, no nausea or no sore throat. This is a chronic problem. The current episode started more than 1 year ago. The problem occurs rarely. The problem has been resolved. The symptoms are aggravated by certain foods and lying down. Associated symptoms include fatigue. Risk factors include obesity. She has tried a PPI for the symptoms. The treatment provided significant relief.  Thyroid Problem Presents for follow-up visit. Symptoms include anxiety and fatigue. Patient reports no constipation, depressed mood, diarrhea, palpitations or visual change. The  symptoms have been stable. Past treatments include levothyroxine. The treatment provided significant relief. Her past medical history is significant for diabetes and hyperlipidemia. There is no history of heart failure.  Shoulder Pain  The pain is present in the left shoulder. This is a new problem. The current episode started  yesterday. There has been a history of trauma. The problem occurs constantly. The problem has been waxing and waning. The quality of the pain is described as aching. The pain is at a severity of 8/10. The pain is mild. Pertinent negatives include no inability to bear weight, joint swelling, numbness or stiffness. The symptoms are aggravated by activity. She has tried acetaminophen and oral narcotics for the symptoms. The treatment provided mild relief. Her past medical history is significant for diabetes.  OAB Pt currently taking Vesicare 5 mg daily. Pt states she continues to get up every hour to "pee'.    Review of Systems  Constitutional: Positive for fatigue. Negative for irritability.  HENT: Negative.  Negative for sore throat.   Eyes: Negative.  Negative for blurred vision.  Respiratory: Positive for shortness of breath (At times). Negative for cough.   Cardiovascular: Negative.  Negative for palpitations.  Gastrointestinal: Negative.  Negative for heartburn, nausea, diarrhea and constipation.  Endocrine: Negative.   Genitourinary: Negative.   Musculoskeletal: Negative.  Negative for myalgias and stiffness.  Neurological: Negative.  Negative for dizziness, numbness and headaches.  Hematological: Negative.   Psychiatric/Behavioral: Negative for confusion. The patient is nervous/anxious and has insomnia.   All other systems reviewed and are negative.      Objective:   Physical Exam  Constitutional: She is oriented to person, place, and time. She appears well-developed and well-nourished. No distress.  HENT:  Head: Normocephalic and atraumatic.  Eyes: Pupils are  equal, round, and reactive to light.  Neck: Normal range of motion. Neck supple. No thyromegaly present.  Cardiovascular: Normal rate, regular rhythm, normal heart sounds and intact distal pulses.   No murmur heard. Pulmonary/Chest: Effort normal and breath sounds normal. No respiratory distress. She has no wheezes.  Abdominal: Soft. Bowel sounds are normal. She exhibits no distension. There is no tenderness.  Musculoskeletal: She exhibits edema (Trace amt in bilaterl feet). She exhibits no tenderness.  Limited ROM of left shoulder, unable to lift arm greater than 30 degree, Crepitus present  Neurological: She is alert and oriented to person, place, and time. She has normal reflexes. No cranial nerve deficit.  Skin: Skin is warm and dry.  Psychiatric: She has a normal mood and affect. Her behavior is normal. Judgment and thought content normal.  Vitals reviewed.  BP 128/70 mmHg  Pulse 60  Temp(Src) 96.7 F (35.9 C) (Oral)  Ht 5' 10" (1.778 m)  Wt 236 lb (107.049 kg)  BMI 33.86 kg/m2  Left shoulder- WNL Preliminary reading by Evelina Dun, FNP [facility]       Assessment & Plan:  1. CEREBROVASCULAR DISEASE - CMP14+EGFR  2. Chronic diastolic heart failure (HCC) - CMP14+EGFR  3. Essential hypertension - CMP14+EGFR  4. Chronic obstructive pulmonary disease, unspecified COPD type (HCC) - CMP14+EGFR  5. Vitamin B 12 deficiency - CMP14+EGFR  6. Gastroesophageal reflux disease, esophagitis presence not specified - CMP14+EGFR  7. Type 2 diabetes mellitus with hyperglycemia, with long-term current use of insulin (HCC) - POCT glycosylated hemoglobin (Hb A1C) - CMP14+EGFR  8. Hypothyroidism, unspecified hypothyroidism type - CMP14+EGFR - Thyroid Panel With TSH  9. Type II diabetes mellitus with peripheral autonomic neuropathy (HCC) - CMP14+EGFR  10. Other polyneuropathy (HCC) - CMP14+EGFR - HYDROcodone-acetaminophen (NORCO/VICODIN) 5-325 MG tablet; TAKE (1) TABLET EVERY SIX  HOURS AS NEEDED FOR MODERATE PAIN  Dispense: 60 tablet; Refill: 0  11. Osteoarthrosis, unspecified whether generalized or localized, involving lower leg - CMP14+EGFR - HYDROcodone-acetaminophen (NORCO/VICODIN) 5-325 MG tablet; TAKE (1)  TABLET EVERY SIX HOURS AS NEEDED FOR MODERATE PAIN  Dispense: 60 tablet; Refill: 0  12. OAB (overactive bladder) - CMP14+EGFR  13. Obesity - CMP14+EGFR  14. Insomnia - CMP14+EGFR  15. Hyperlipidemia - CMP14+EGFR - Lipid panel  16. GAD (generalized anxiety disorder) - CMP14+EGFR  17. Left shoulder pain  - CMP14+EGFR - DG Shoulder Left; Future - CT Shoulder Left Wo Contrast; Future   Continue all meds Labs pending Health Maintenance reviewed Diet and exercise encouraged RTO 3 months   Evelina Dun, FNP

## 2015-05-02 NOTE — Patient Instructions (Signed)
Rotator Cuff Injury °Rotator cuff injury is any type of injury to the set of muscles and tendons that make up the stabilizing unit of your shoulder. This unit holds the ball of your upper arm bone (humerus) in the socket of your shoulder blade (scapula).  °CAUSES °Injuries to your rotator cuff most commonly come from sports or activities that cause your arm to be moved repeatedly over your head. Examples of this include throwing, weight lifting, swimming, or racquet sports. Long lasting (chronic) irritation of your rotator cuff can cause soreness and swelling (inflammation), bursitis, and eventual damage to your tendons, such as a tear (rupture). °SIGNS AND SYMPTOMS °Acute rotator cuff tear: °· Sudden tearing sensation followed by severe pain shooting from your upper shoulder down your arm toward your elbow. °· Decreased range of motion of your shoulder because of pain and muscle spasm. °· Severe pain. °· Inability to raise your arm out to the side because of pain and loss of muscle power (large tears). °Chronic rotator cuff tear: °· Pain that usually is worse at night and may interfere with sleep. °· Gradual weakness and decreased shoulder motion as the pain worsens. °· Decreased range of motion. °Rotator cuff tendinitis:  °· Deep ache in your shoulder and the outside upper arm over your shoulder. °· Pain that comes on gradually and becomes worse when lifting your arm to the side or turning it inward. °DIAGNOSIS °Rotator cuff injury is diagnosed through a medical history, physical exam, and imaging exam. The medical history helps determine the type of rotator cuff injury. Your health care provider will look at your injured shoulder, feel the injured area, and ask you to move your shoulder in different positions. X-ray exams typically are done to rule out other causes of shoulder pain, such as fractures. MRI is the exam of choice for the most severe shoulder injuries because the images show muscles and tendons.    °TREATMENT  °Chronic tear: °· Medicine for pain, such as acetaminophen or ibuprofen. °· Physical therapy and range-of-motion exercises may be helpful in maintaining shoulder function and strength. °· Steroid injections into your shoulder joint. °· Surgical repair of the rotator cuff if the injury does not heal with noninvasive treatment. °Acute tear: °· Anti-inflammatory medicines such as ibuprofen and naproxen to help reduce pain and swelling. °· A sling to help support your arm and rest your rotator cuff muscles. Long-term use of a sling is not advised. It may cause significant stiffening of the shoulder joint. °· Surgery may be considered within a few weeks, especially in younger, active people, to return the shoulder to full function. °· Indications for surgical treatment include the following: °¨ Age younger than 60 years. °¨ Rotator cuff tears that are complete. °¨ Physical therapy, rest, and anti-inflammatory medicines have been used for 6-8 weeks, with no improvement. °¨ Employment or sporting activity that requires constant shoulder use. °Tendinitis: °· Anti-inflammatory medicines such as ibuprofen and naproxen to help reduce pain and swelling. °· A sling to help support your arm and rest your rotator cuff muscles. Long-term use of a sling is not advised. It may cause significant stiffening of the shoulder joint. °· Severe tendinitis may require: °¨ Steroid injections into your shoulder joint. °¨ Physical therapy. °¨ Surgery. °HOME CARE INSTRUCTIONS  °· Apply ice to your injury: °¨ Put ice in a plastic bag. °¨ Place a towel between your skin and the bag. °¨ Leave the ice on for 20 minutes, 2-3 times a day. °· If you   have a shoulder immobilizer (sling and straps), wear it until told otherwise by your health care provider. °· You may want to sleep on several pillows or in a recliner at night to lessen swelling and pain. °· Only take over-the-counter or prescription medicines for pain, discomfort, or fever as  directed by your health care provider. °· Do simple hand squeezing exercises with a soft rubber ball to decrease hand swelling. °SEEK MEDICAL CARE IF:  °· Your shoulder pain increases, or new pain or numbness develops in your arm, hand, or fingers. °· Your hand or fingers are colder than your other hand. °SEEK IMMEDIATE MEDICAL CARE IF:  °· Your arm, hand, or fingers are numb or tingling. °· Your arm, hand, or fingers are increasingly swollen and painful, or they turn white or blue. °MAKE SURE YOU: °· Understand these instructions. °· Will watch your condition. °· Will get help right away if you are not doing well or get worse. °  °This information is not intended to replace advice given to you by your health care provider. Make sure you discuss any questions you have with your health care provider. °  °Document Released: 05/25/2000 Document Revised: 06/02/2013 Document Reviewed: 01/07/2013 °Elsevier Interactive Patient Education ©2016 [employer] ° °

## 2015-05-03 ENCOUNTER — Other Ambulatory Visit: Payer: Self-pay | Admitting: Family

## 2015-05-03 LAB — CMP14+EGFR
ALT: 26 IU/L (ref 0–32)
AST: 36 IU/L (ref 0–40)
Albumin/Globulin Ratio: 1.4 (ref 1.1–2.5)
Albumin: 4.3 g/dL (ref 3.5–4.7)
Alkaline Phosphatase: 68 IU/L (ref 39–117)
BILIRUBIN TOTAL: 0.4 mg/dL (ref 0.0–1.2)
BUN/Creatinine Ratio: 12 (ref 11–26)
BUN: 14 mg/dL (ref 8–27)
CHLORIDE: 92 mmol/L — AB (ref 97–106)
CO2: 25 mmol/L (ref 18–29)
Calcium: 9.7 mg/dL (ref 8.7–10.3)
Creatinine, Ser: 1.18 mg/dL — ABNORMAL HIGH (ref 0.57–1.00)
GFR calc Af Amer: 50 mL/min/{1.73_m2} — ABNORMAL LOW (ref >59)
GFR calc non Af Amer: 44 mL/min/{1.73_m2} — ABNORMAL LOW (ref >59)
GLUCOSE: 116 mg/dL — AB (ref 65–99)
Globulin, Total: 3.1 g/dL (ref 1.5–4.5)
POTASSIUM: 5.3 mmol/L — AB (ref 3.5–5.2)
Sodium: 135 mmol/L — ABNORMAL LOW (ref 136–144)
Total Protein: 7.4 g/dL (ref 6.0–8.5)

## 2015-05-03 LAB — THYROID PANEL WITH TSH
FREE THYROXINE INDEX: 2.4 (ref 1.2–4.9)
T3 Uptake Ratio: 26 % (ref 24–39)
T4, Total: 9.1 ug/dL (ref 4.5–12.0)
TSH: 7.46 u[IU]/mL — ABNORMAL HIGH (ref 0.450–4.500)

## 2015-05-03 LAB — LIPID PANEL
CHOLESTEROL TOTAL: 163 mg/dL (ref 100–199)
Chol/HDL Ratio: 3.8 ratio units (ref 0.0–4.4)
HDL: 43 mg/dL (ref >39)
LDL Calculated: 81 mg/dL (ref 0–99)
TRIGLYCERIDES: 195 mg/dL — AB (ref 0–149)
VLDL CHOLESTEROL CAL: 39 mg/dL (ref 5–40)

## 2015-05-03 MED ORDER — LEVOTHYROXINE SODIUM 75 MCG PO TABS: 75.0000 ug | ORAL_TABLET | Freq: Every day | ORAL | Status: DC

## 2015-05-04 ENCOUNTER — Telehealth: Payer: Self-pay | Admitting: Family

## 2015-05-04 NOTE — Telephone Encounter (Signed)
Pt having continued shoulder pain She is taking pain med every 6 hrs and using heat Pt informed if med is not taking care of pain she will need to go to ED per Dr Oswaldo DoneVincent

## 2015-05-13 ENCOUNTER — Other Ambulatory Visit: Payer: Self-pay | Admitting: Family

## 2015-05-20 ENCOUNTER — Telehealth: Payer: Self-pay | Admitting: Family

## 2015-05-23 ENCOUNTER — Other Ambulatory Visit: Payer: Self-pay | Admitting: Family

## 2015-05-23 MED ORDER — GLUCOSE BLOOD VI STRP: ORAL_STRIP | Status: DC

## 2015-05-23 NOTE — Telephone Encounter (Signed)
Last seen 05/02/15  [name] [name]  If approved route to nurse [facility]

## 2015-05-23 NOTE — Telephone Encounter (Signed)
Please send in a script for a generic meter to check blood sugar.  Accu chek strips will be covered usually better per insurance .  Testing times daily and diagnosis added.  Meters given from this office have supplies ordered that insurance does not want to pay much on.  This [facility].

## 2015-05-23 NOTE — Telephone Encounter (Signed)
RX for test strips sent to pharmacy 

## 2015-05-24 NOTE — Telephone Encounter (Signed)
[facility]

## 2015-05-27 NOTE — Telephone Encounter (Signed)
Christy done

## 2015-06-02 ENCOUNTER — Other Ambulatory Visit: Payer: Self-pay | Admitting: Family

## 2015-06-02 DIAGNOSIS — M1710 Unilateral primary osteoarthritis, unspecified knee: Secondary | ICD-10-CM

## 2015-06-02 DIAGNOSIS — G6289 Other specified polyneuropathies: Secondary | ICD-10-CM

## 2015-06-02 DIAGNOSIS — IMO0002 Reserved for concepts with insufficient information to code with codable children: Secondary | ICD-10-CM

## 2015-06-02 MED ORDER — HYDROCODONE-ACETAMINOPHEN 5-325 MG PO TABS: ORAL_TABLET | ORAL | Status: DC

## 2015-06-02 NOTE — Telephone Encounter (Signed)
Pt notified Rx is ready for pick up.

## 2015-06-02 NOTE — Telephone Encounter (Signed)
RX ready for pick up 

## 2015-06-03 ENCOUNTER — Telehealth: Payer: Self-pay | Admitting: Family

## 2015-06-03 MED ORDER — AZITHROMYCIN 250 MG PO TABS: ORAL_TABLET | ORAL | Status: DC

## 2015-06-03 NOTE — Telephone Encounter (Signed)
Prescription sent to pharmacy.

## 2015-06-17 ENCOUNTER — Ambulatory Visit (INDEPENDENT_AMBULATORY_CARE_PROVIDER_SITE_OTHER): Payer: Medicare Other | Admitting: Pharmacist

## 2015-06-17 ENCOUNTER — Encounter: Payer: Self-pay | Admitting: Pharmacist

## 2015-06-17 VITALS — BP 134/72 | HR 68 | Ht 70.0 in | Wt 236.0 lb

## 2015-06-17 DIAGNOSIS — E1165 Type 2 diabetes mellitus with hyperglycemia: Secondary | ICD-10-CM | POA: Diagnosis not present

## 2015-06-17 DIAGNOSIS — E669 Obesity, unspecified: Secondary | ICD-10-CM

## 2015-06-17 DIAGNOSIS — I10 Essential (primary) hypertension: Secondary | ICD-10-CM

## 2015-06-17 DIAGNOSIS — Z794 Long term (current) use of insulin: Secondary | ICD-10-CM

## 2015-06-17 DIAGNOSIS — E785 Hyperlipidemia, unspecified: Secondary | ICD-10-CM | POA: Diagnosis not present

## 2015-06-17 DIAGNOSIS — N3281 Overactive bladder: Secondary | ICD-10-CM

## 2015-06-17 MED ORDER — MIRABEGRON ER 25 MG PO TB24
25.0000 mg | ORAL_TABLET | Freq: Every day | ORAL | Status: DC
Start: 2015-06-17 — End: 2015-07-08

## 2015-06-17 NOTE — Patient Instructions (Signed)
Stop Vesicare - start Myrbetriq 25mg  - take 1 tablet daily in the evening for bladder (we can increase to 50mg  daily if you need)  Limit beverages / fluids within 2 hours of bedtime.  No caffeine containing beverages

## 2015-06-17 NOTE — Progress Notes (Signed)
Patient ID: [name] [name], female   DOB: 04/09/1933, 82 y.o.   MRN: [id number] Subjective:    [name] [name] is a 82 y.o. female who presents for reevaluation of Type 2 diabetes mellitus. Current medications for diabetes includes:  Humulin NPH 10 units qd (has decreased over last 6 months from 20units bid) glimepride 4mg  1 tablet daily and metformin XR 1000mg  1 tablet bid.   Known diabetic complications: peripheral neuropathy and cardiovascular disease Cardiovascular risk factors: advanced age (older than 555 for men, 8665 for women), diabetes mellitus, dyslipidemia, family history of premature cardiovascular disease, hypertension, obesity (BMI >= 30 kg/m2) and sedentary lifestyle  Eye exam current (within one year): unknown Weight trend: has decreased by 23# since our first visit 06/24/2014 Prior visit with dietician: no Current diet: has improved but patient admits to times of high CHO intake Current exercise: no regular exercise  Patient checks BG 1-2 times daily.  Lowest 110 and highest 185.  Patient was previously having frequently nocturnal hypoglycemia but has not experienced any hypoglycemia recently  Is She on ACE inhibitor or angiotensin II receptor blocker?  Yes The following portions of the patient's history were reviewed and updated as appropriate: allergies, current medications, past family history, past medical history, past social history, past surgical history and problem list.   Review of Systems  Constitutional: Positive for weight loss.  HENT: Negative.   Eyes: Negative.   Cardiovascular: Negative.   Genitourinary: Positive for frequency (pt reports nocturia - wakes about every 2 to 3 hours.).          Skin: Positive for rash.    Objective:    BP 134/72 mmHg  Pulse 68  Ht 5\' 10"  (1.778 m)  Wt 236 lb (107.049 kg)  BMI 33.86 kg/m2  Lab Review GLUCOSE (mg/dL)  Date Value  69/62/952811/21/2016 116*  01/13/2015 191*  01/11/2015 122*   GLUCOSE, BLD (mg/dL)  Date Value   41/32/440106/26/2014 153*  06/20/2012 146*  12/04/2010 111*   CO2 (mmol/L)  Date Value  05/02/2015 25  01/13/2015 23  01/11/2015 25   BUN (mg/dL)  Date Value  02/72/536611/21/2016 14  01/13/2015 11  01/11/2015 16  12/04/2012 12  06/20/2012 16  12/04/2010 12   CREAT (mg/dL)  Date Value  44/03/474206/26/2014 0.88  06/20/2012 0.95  12/04/2010 1.05   CREATININE, SER (mg/dL)  Date Value  59/56/387511/21/2016 1.18*  01/13/2015 0.80  01/11/2015 1.40*    Last A1c = 7.0% (05/02/2015)  7.2% (01/10/2015) and 8.4% (10/2014)  Assessment:    Diabetes Mellitus type II, under improved control.  HTN - BP at goal and patient is taking AEC-I Hyperlipidemia - controlled with current medication Obesity - has lost about 23# since insulin reduction about 1 year ago Nocturia / overactive bladder - currently taking 5mg  vesicare   Plan:    1.  Rx changes:   Continue Insulin NPH 10 units BID  Continue metformin to 1000mg - take 1 tablet BID with food-  Continue glimepiride to 4mg  1 tablet daily  D/c vesicare and start Myrbetriq 25mg  1 tablet daily - can increase to 50mg  daily in 6 to 8 weeks if needed. 2.  Education: Reviewed 'ABCs' of diabetes management and treatment goals (respective goals in parentheses):  A1C (<7), blood pressure (<130/80), and cholesterol (LDL <100).  3. Continue with current dietary changes - great job with weight loss. 4. RTC in 1 month to see PCP;  Follow up with CDE / me in 2 months for AWV  Cherre Robins, PharmD, CPP, CDE

## 2015-06-18 ENCOUNTER — Other Ambulatory Visit: Payer: Self-pay | Admitting: Nurse Practitioner

## 2015-06-22 ENCOUNTER — Other Ambulatory Visit: Payer: Self-pay | Admitting: Family

## 2015-07-01 ENCOUNTER — Other Ambulatory Visit: Payer: Self-pay | Admitting: Family

## 2015-07-01 DIAGNOSIS — IMO0002 Reserved for concepts with insufficient information to code with codable children: Secondary | ICD-10-CM

## 2015-07-01 DIAGNOSIS — M1710 Unilateral primary osteoarthritis, unspecified knee: Secondary | ICD-10-CM

## 2015-07-01 DIAGNOSIS — G6289 Other specified polyneuropathies: Secondary | ICD-10-CM

## 2015-07-01 NOTE — Telephone Encounter (Signed)
This will have to be forwarded to [facility] for Monday. Please inform patient with a half to plan ahead if they're going to run out. 

## 2015-07-01 NOTE — Telephone Encounter (Signed)
[facility] pt. Last filled 06/02/15, last seen 05/02/15 

## 2015-07-04 ENCOUNTER — Telehealth: Payer: Self-pay | Admitting: Family

## 2015-07-04 MED ORDER — HYDROCODONE-ACETAMINOPHEN 5-325 MG PO TABS: ORAL_TABLET | ORAL | Status: DC

## 2015-07-04 NOTE — Telephone Encounter (Signed)
Pt aware rx is ready for pickup.  

## 2015-07-04 NOTE — Telephone Encounter (Signed)
RX ready for pick up 

## 2015-07-04 NOTE — Telephone Encounter (Signed)
[name] [name]  Can she come leave a sample or NTBS

## 2015-07-05 ENCOUNTER — Encounter: Payer: Self-pay | Admitting: Family

## 2015-07-05 ENCOUNTER — Ambulatory Visit (INDEPENDENT_AMBULATORY_CARE_PROVIDER_SITE_OTHER): Payer: Medicare Other | Admitting: Family

## 2015-07-05 VITALS — BP 126/65 | HR 61 | Temp 97.0°F | Ht 70.0 in | Wt 236.2 lb

## 2015-07-05 DIAGNOSIS — R350 Frequency of micturition: Secondary | ICD-10-CM

## 2015-07-05 DIAGNOSIS — B372 Candidiasis of skin and nail: Secondary | ICD-10-CM

## 2015-07-05 DIAGNOSIS — N3000 Acute cystitis without hematuria: Secondary | ICD-10-CM | POA: Diagnosis not present

## 2015-07-05 LAB — POCT URINALYSIS DIPSTICK
Bilirubin, UA: NEGATIVE
GLUCOSE UA: NEGATIVE
KETONES UA: NEGATIVE
Nitrite, UA: NEGATIVE
Protein, UA: NEGATIVE
RBC UA: NEGATIVE
SPEC GRAV UA: 1.01
Urobilinogen, UA: NEGATIVE
pH, UA: 7.5

## 2015-07-05 LAB — POCT UA - MICROSCOPIC ONLY
CASTS, UR, LPF, POC: NEGATIVE
CRYSTALS, UR, HPF, POC: NEGATIVE
Mucus, UA: NEGATIVE
RBC, URINE, MICROSCOPIC: NEGATIVE
Yeast, UA: NEGATIVE

## 2015-07-05 MED ORDER — FLUCONAZOLE 150 MG PO TABS: 150.0000 mg | ORAL_TABLET | Freq: Every day | ORAL | Status: DC

## 2015-07-05 MED ORDER — SULFAMETHOXAZOLE-TRIMETHOPRIM 800-160 MG PO TABS
1.0000 | ORAL_TABLET | Freq: Two times a day (BID) | ORAL | Status: DC
Start: 2015-07-05 — End: 2015-08-02

## 2015-07-05 MED ORDER — NYAMYC 100000 UNIT/GM EX POWD: CUTANEOUS | Status: DC

## 2015-07-05 NOTE — Progress Notes (Signed)
   Subjective:    Patient ID: [name] [name], female    DOB: [birth year], 82 y.o.   MRN: [id number]  Urinary Frequency  This is a new problem. The current episode started in the past 7 days. The problem occurs every urination. The problem has been gradually worsening. The quality of the pain is described as aching. The pain is at a severity of 7/10. The pain is moderate. Associated symptoms include frequency, hesitancy, urgency and vomiting. Pertinent negatives include no chills, discharge or nausea. She has tried increased fluids for the symptoms. The treatment provided mild relief.      Review of Systems  Constitutional: Negative.  Negative for chills.  HENT: Negative.   Eyes: Negative.   Respiratory: Negative.  Negative for shortness of breath.   Cardiovascular: Negative.  Negative for palpitations.  Gastrointestinal: Positive for vomiting. Negative for nausea.  Endocrine: Negative.   Genitourinary: Positive for hesitancy, urgency and frequency.  Musculoskeletal: Negative.   Neurological: Negative.  Negative for headaches.  Hematological: Negative.   Psychiatric/Behavioral: Negative.   All other systems reviewed and are negative.      Objective:   Physical Exam  Constitutional: She is oriented to person, place, and time. She appears well-developed and well-nourished. No distress.  HENT:  Head: Normocephalic and atraumatic.  Eyes: Pupils are equal, round, and reactive to light.  Neck: Normal range of motion. Neck supple. No thyromegaly present.  Cardiovascular: Normal rate, regular rhythm, normal heart sounds and intact distal pulses.   No murmur heard. Pulmonary/Chest: Effort normal and breath sounds normal. No respiratory distress. She has no wheezes.  Abdominal: Soft. Bowel sounds are normal. She exhibits no distension. There is tenderness.  Mild subpubic pain   Musculoskeletal: Normal range of motion. She exhibits no edema or tenderness.  Negative for CVA tenderness     Neurological: She is alert and oriented to person, place, and time. She has normal reflexes. No cranial nerve deficit.  Skin: Skin is warm and dry. Rash (erythemas candidosis rash under Panniculus) noted.  Psychiatric: She has a normal mood and affect. Her behavior is normal. Judgment and thought content normal.  Vitals reviewed.      Temp(Src) 97 F (36.1 C) (Oral)  Ht  (1.778 m)  Wt 236 lb 3.2 oz (107.14 kg)  BMI 33.89 kg/m2     Assessment & Plan:  1. Frequent urination - POCT UA - Microscopic Only - POCT urinalysis dipstick  2. Acute cystitis without hematuria -Force fluids AZO over the counter X2 days RTO prn - sulfamethoxazole-trimethoprim (BACTRIM DS) 800-160 MG tablet; Take 1 tablet by mouth 2 (two) times daily.  Dispense: 14 tablet; Refill: 0  3. Skin candidiasis -Keep area clean and dry -Continue with nystatin ointment and powder -RTO prn  - Nystatin (NYAMYC) 100000 UNIT/GM POWD; APPLY TO AFFECTED AREA 3 TIMES A DAY  Dispense: 60 g; Refill: 3 - fluconazole (DIFLUCAN) 150 MG tablet; Take 1 tablet (150 mg total) by mouth daily.  Dispense: 5 tablet; Refill: 0  Jannifer Rodney, FNP

## 2015-07-05 NOTE — Patient Instructions (Signed)
Cutaneous Candidiasis °Cutaneous candidiasis is a condition in which there is an overgrowth of yeast (candida) on the skin. Yeast normally live on the skin, but in small enough numbers not to cause any symptoms. In certain cases, increased growth of the yeast may cause an actual yeast infection. This kind of infection usually occurs in areas of the skin that are constantly warm and moist, such as the armpits or the groin. Yeast is the most common cause of diaper rash in babies and in people who cannot control their bowel movements (incontinence). °CAUSES  °The fungus that most often causes cutaneous candidiasis is Candida albicans. Conditions that can increase the risk of getting a yeast infection of the skin include: °· Obesity. °· Pregnancy. °· Diabetes. °· Taking antibiotic medicine. °· Taking birth control pills. °· Taking steroid medicines. °· Thyroid disease. °· An iron or zinc deficiency. °· Problems with the immune system. °SYMPTOMS  °· Red, swollen area of the skin. °· Bumps on the skin. °· Itchiness. °DIAGNOSIS  °The diagnosis of cutaneous candidiasis is usually based on its appearance. Light scrapings of the skin may also be taken and viewed under a microscope to identify the presence of yeast. °TREATMENT  °Antifungal creams may be applied to the infected skin. In severe cases, oral medicines may be needed.  °HOME CARE INSTRUCTIONS  °· Keep your skin clean and dry. °· Maintain a healthy weight. °· If you have diabetes, keep your blood sugar under control. °SEEK IMMEDIATE MEDICAL CARE IF: °· Your rash continues to spread despite treatment. °· You have a fever, chills, or abdominal pain. °  °This information is not intended to replace advice given to you by your health care provider. Make sure you discuss any questions you have with your health care provider. °  °Document Released: 02/13/2011 Document Revised: 08/20/2011 Document Reviewed: 11/29/2014 °Elsevier Interactive Patient Education ©2016 [employer] ° °

## 2015-07-05 NOTE — Telephone Encounter (Signed)
Pt needs to be seen. I have appts today

## 2015-07-05 NOTE — Telephone Encounter (Signed)
Patient scheduled to be seen with provider today.

## 2015-07-08 ENCOUNTER — Other Ambulatory Visit: Payer: Self-pay | Admitting: Pharmacist

## 2015-07-08 ENCOUNTER — Other Ambulatory Visit: Payer: Self-pay | Admitting: Family Medicine

## 2015-07-08 ENCOUNTER — Other Ambulatory Visit: Payer: Self-pay | Admitting: Family

## 2015-07-09 ENCOUNTER — Other Ambulatory Visit: Payer: Self-pay | Admitting: Family Medicine

## 2015-07-11 ENCOUNTER — Other Ambulatory Visit: Payer: Self-pay

## 2015-07-11 MED ORDER — NYSTATIN-TRIAMCINOLONE 100000-0.1 UNIT/GM-% EX CREA: TOPICAL_CREAM | CUTANEOUS | Status: DC

## 2015-07-21 ENCOUNTER — Other Ambulatory Visit: Payer: Self-pay | Admitting: Family

## 2015-07-21 ENCOUNTER — Other Ambulatory Visit: Payer: Self-pay | Admitting: Family Medicine

## 2015-07-29 ENCOUNTER — Other Ambulatory Visit: Payer: Self-pay | Admitting: Family Medicine

## 2015-07-29 ENCOUNTER — Telehealth: Payer: Self-pay | Admitting: Family

## 2015-07-29 ENCOUNTER — Other Ambulatory Visit: Payer: Self-pay | Admitting: Pharmacist

## 2015-07-29 ENCOUNTER — Other Ambulatory Visit: Payer: Self-pay | Admitting: Family

## 2015-07-29 NOTE — Telephone Encounter (Signed)
done

## 2015-07-29 NOTE — Telephone Encounter (Signed)
Last KCL labs were slightly high?

## 2015-08-02 ENCOUNTER — Encounter: Payer: Self-pay | Admitting: Family

## 2015-08-02 ENCOUNTER — Ambulatory Visit (INDEPENDENT_AMBULATORY_CARE_PROVIDER_SITE_OTHER): Payer: Medicare Other | Admitting: Family

## 2015-08-02 VITALS — BP 128/68 | HR 68 | Temp 97.2°F | Ht 70.0 in | Wt 243.8 lb

## 2015-08-02 DIAGNOSIS — M179 Osteoarthritis of knee, unspecified: Secondary | ICD-10-CM

## 2015-08-02 DIAGNOSIS — F411 Generalized anxiety disorder: Secondary | ICD-10-CM

## 2015-08-02 DIAGNOSIS — E039 Hypothyroidism, unspecified: Secondary | ICD-10-CM

## 2015-08-02 DIAGNOSIS — Z794 Long term (current) use of insulin: Secondary | ICD-10-CM | POA: Diagnosis not present

## 2015-08-02 DIAGNOSIS — E1165 Type 2 diabetes mellitus with hyperglycemia: Secondary | ICD-10-CM

## 2015-08-02 DIAGNOSIS — E538 Deficiency of other specified B group vitamins: Secondary | ICD-10-CM

## 2015-08-02 DIAGNOSIS — K219 Gastro-esophageal reflux disease without esophagitis: Secondary | ICD-10-CM

## 2015-08-02 DIAGNOSIS — I10 Essential (primary) hypertension: Secondary | ICD-10-CM | POA: Diagnosis not present

## 2015-08-02 DIAGNOSIS — E669 Obesity, unspecified: Secondary | ICD-10-CM

## 2015-08-02 DIAGNOSIS — I2510 Atherosclerotic heart disease of native coronary artery without angina pectoris: Secondary | ICD-10-CM

## 2015-08-02 DIAGNOSIS — G4700 Insomnia, unspecified: Secondary | ICD-10-CM

## 2015-08-02 DIAGNOSIS — E785 Hyperlipidemia, unspecified: Secondary | ICD-10-CM

## 2015-08-02 DIAGNOSIS — E1143 Type 2 diabetes mellitus with diabetic autonomic (poly)neuropathy: Secondary | ICD-10-CM

## 2015-08-02 DIAGNOSIS — M1710 Unilateral primary osteoarthritis, unspecified knee: Secondary | ICD-10-CM

## 2015-08-02 DIAGNOSIS — J449 Chronic obstructive pulmonary disease, unspecified: Secondary | ICD-10-CM | POA: Diagnosis not present

## 2015-08-02 DIAGNOSIS — G6289 Other specified polyneuropathies: Secondary | ICD-10-CM

## 2015-08-02 DIAGNOSIS — IMO0002 Reserved for concepts with insufficient information to code with codable children: Secondary | ICD-10-CM

## 2015-08-02 DIAGNOSIS — N3281 Overactive bladder: Secondary | ICD-10-CM

## 2015-08-02 DIAGNOSIS — I5032 Chronic diastolic (congestive) heart failure: Secondary | ICD-10-CM

## 2015-08-02 LAB — POCT GLYCOSYLATED HEMOGLOBIN (HGB A1C): HEMOGLOBIN A1C: 6.4

## 2015-08-02 MED ORDER — MIRABEGRON ER 50 MG PO TB24
50.0000 mg | ORAL_TABLET | Freq: Every day | ORAL | Status: DC
Start: 2015-08-02 — End: 2016-01-24

## 2015-08-02 MED ORDER — HYDROCODONE-ACETAMINOPHEN 5-325 MG PO TABS: ORAL_TABLET | ORAL | Status: DC

## 2015-08-02 NOTE — Patient Instructions (Signed)
Health Maintenance, Female Adopting a healthy lifestyle and getting preventive care can go a long way to promote health and wellness. Talk with your health care provider about what schedule of regular examinations is right for you. This is a good chance for you to check in with your provider about disease prevention and staying healthy. In between checkups, there are plenty of things you can do on your own. Experts have done a lot of research about which lifestyle changes and preventive measures are most likely to keep you healthy. Ask your health care provider for more information. WEIGHT AND DIET  Eat a healthy diet  Be sure to include plenty of vegetables, fruits, low-fat dairy products, and lean protein.  Do not eat a lot of foods high in solid fats, added sugars, or salt.  Get regular exercise. This is one of the most important things you can do for your health.  Most adults should exercise for at least 150 minutes each week. The exercise should increase your heart rate and make you sweat (moderate-intensity exercise).  Most adults should also do strengthening exercises at least twice a week. This is in addition to the moderate-intensity exercise.  Maintain a healthy weight  Body mass index (BMI) is a measurement that can be used to identify possible weight problems. It estimates body fat based on height and weight. Your health care provider can help determine your BMI and help you achieve or maintain a healthy weight.  For females 20 years of age and older:   A BMI below 18.5 is considered underweight.  A BMI of 18.5 to 24.9 is normal.  A BMI of 25 to 29.9 is considered overweight.  A BMI of 30 and above is considered obese.  Watch levels of cholesterol and blood lipids  You should start having your blood tested for lipids and cholesterol at 80 years of age, then have this test every 5 years.  You may need to have your cholesterol levels checked more often if:  Your lipid  or cholesterol levels are high.  You are older than 80 years of age.  You are at high risk for heart disease.  CANCER SCREENING   Lung Cancer  Lung cancer screening is recommended for adults 55-80 years old who are at high risk for lung cancer because of a history of smoking.  A yearly low-dose CT scan of the lungs is recommended for people who:  Currently smoke.  Have quit within the past 15 years.  Have at least a 30-pack-year history of smoking. A pack year is smoking an average of one pack of cigarettes a day for 1 year.  Yearly screening should continue until it has been 15 years since you quit.  Yearly screening should stop if you develop a health problem that would prevent you from having lung cancer treatment.  Breast Cancer  Practice breast self-awareness. This means understanding how your breasts normally appear and feel.  It also means doing regular breast self-exams. Let your health care provider know about any changes, no matter how small.  If you are in your 20s or 30s, you should have a clinical breast exam (CBE) by a health care provider every 1-3 years as part of a regular health exam.  If you are 40 or older, have a CBE every year. Also consider having a breast X-ray (mammogram) every year.  If you have a family history of breast cancer, talk to your health care provider about genetic screening.  If you   are at high risk for breast cancer, talk to your health care provider about having an MRI and a mammogram every year.  Breast cancer gene (BRCA) assessment is recommended for women who have family members with BRCA-related cancers. BRCA-related cancers include:  Breast.  Ovarian.  Tubal.  Peritoneal cancers.  Results of the assessment will determine the need for genetic counseling and BRCA1 and BRCA2 testing. Cervical Cancer Your health care provider may recommend that you be screened regularly for cancer of the pelvic organs (ovaries, uterus, and  vagina). This screening involves a pelvic examination, including checking for microscopic changes to the surface of your cervix (Pap test). You may be encouraged to have this screening done every 3 years, beginning at age 21.  For women ages 30-65, health care providers may recommend pelvic exams and Pap testing every 3 years, or they may recommend the Pap and pelvic exam, combined with testing for human papilloma virus (HPV), every 5 years. Some types of HPV increase your risk of cervical cancer. Testing for HPV may also be done on women of any age with unclear Pap test results.  Other health care providers may not recommend any screening for nonpregnant women who are considered low risk for pelvic cancer and who do not have symptoms. Ask your health care provider if a screening pelvic exam is right for you.  If you have had past treatment for cervical cancer or a condition that could lead to cancer, you need Pap tests and screening for cancer for at least 20 years after your treatment. If Pap tests have been discontinued, your risk factors (such as having a new sexual partner) need to be reassessed to determine if screening should resume. Some women have medical problems that increase the chance of getting cervical cancer. In these cases, your health care provider may recommend more frequent screening and Pap tests. Colorectal Cancer  This type of cancer can be detected and often prevented.  Routine colorectal cancer screening usually begins at 80 years of age and continues through 80 years of age.  Your health care provider may recommend screening at an earlier age if you have risk factors for colon cancer.  Your health care provider may also recommend using home test kits to check for hidden blood in the stool.  A small camera at the end of a tube can be used to examine your colon directly (sigmoidoscopy or colonoscopy). This is done to check for the earliest forms of colorectal  cancer.  Routine screening usually begins at age 50.  Direct examination of the colon should be repeated every 5-10 years through 80 years of age. However, you may need to be screened more often if early forms of precancerous polyps or small growths are found. Skin Cancer  Check your skin from head to toe regularly.  Tell your health care provider about any new moles or changes in moles, especially if there is a change in a mole's shape or color.  Also tell your health care provider if you have a mole that is larger than the size of a pencil eraser.  Always use sunscreen. Apply sunscreen liberally and repeatedly throughout the day.  Protect yourself by wearing long sleeves, pants, a wide-brimmed hat, and sunglasses whenever you are outside. HEART DISEASE, DIABETES, AND HIGH BLOOD PRESSURE   High blood pressure causes heart disease and increases the risk of stroke. High blood pressure is more likely to develop in:  People who have blood pressure in the high end   of the normal range (130-139/85-89 mm Hg).  People who are overweight or obese.  People who are African American.  If you are 38-23 years of age, have your blood pressure checked every 3-5 years. If you are 61 years of age or older, have your blood pressure checked every year. You should have your blood pressure measured twice--once when you are at a hospital or clinic, and once when you are not at a hospital or clinic. Record the average of the two measurements. To check your blood pressure when you are not at a hospital or clinic, you can use:  An automated blood pressure machine at a pharmacy.  A home blood pressure monitor.  If you are between 45 years and 39 years old, ask your health care provider if you should take aspirin to prevent strokes.  Have regular diabetes screenings. This involves taking a blood sample to check your fasting blood sugar level.  If you are at a normal weight and have a low risk for diabetes,  have this test once every three years after 80 years of age.  If you are overweight and have a high risk for diabetes, consider being tested at a younger age or more often. PREVENTING INFECTION  Hepatitis B  If you have a higher risk for hepatitis B, you should be screened for this virus. You are considered at high risk for hepatitis B if:  You were born in a country where hepatitis B is common. Ask your health care provider which countries are considered high risk.  Your parents were born in a high-risk country, and you have not been immunized against hepatitis B (hepatitis B vaccine).  You have HIV or AIDS.  You use needles to inject street drugs.  You live with someone who has hepatitis B.  You have had sex with someone who has hepatitis B.  You get hemodialysis treatment.  You take certain medicines for conditions, including cancer, organ transplantation, and autoimmune conditions. Hepatitis C  Blood testing is recommended for:  Everyone born from [birth year] through 1963.  Anyone with known risk factors for hepatitis C. Sexually transmitted infections (STIs)  You should be screened for sexually transmitted infections (STIs) including gonorrhea and chlamydia if:  You are sexually active and are younger than 80 years of age.  You are older than 80 years of age and your health care provider tells you that you are at risk for this type of infection.  Your sexual activity has changed since you were last screened and you are at an increased risk for chlamydia or gonorrhea. Ask your health care provider if you are at risk.  If you do not have HIV, but are at risk, it may be recommended that you take a prescription medicine daily to prevent HIV infection. This is called pre-exposure prophylaxis (PrEP). You are considered at risk if:  You are sexually active and do not regularly use condoms or know the HIV status of your partner(s).  You take drugs by injection.  You are sexually  active with a partner who has HIV. Talk with your health care provider about whether you are at high risk of being infected with HIV. If you choose to begin PrEP, you should first be tested for HIV. You should then be tested every 3 months for as long as you are taking PrEP.  PREGNANCY   If you are premenopausal and you may become pregnant, ask your health care provider about preconception counseling.  If you may  become pregnant, take 400 to 800 micrograms (mcg) of folic acid every day.  If you want to prevent pregnancy, talk to your health care provider about birth control (contraception). OSTEOPOROSIS AND MENOPAUSE   Osteoporosis is a disease in which the bones lose minerals and strength with aging. This can result in serious bone fractures. Your risk for osteoporosis can be identified using a bone density scan.  If you are 61 years of age or older, or if you are at risk for osteoporosis and fractures, ask your health care provider if you should be screened.  Ask your health care provider whether you should take a calcium or vitamin D supplement to lower your risk for osteoporosis.  Menopause may have certain physical symptoms and risks.  Hormone replacement therapy may reduce some of these symptoms and risks. Talk to your health care provider about whether hormone replacement therapy is right for you.  HOME CARE INSTRUCTIONS   Schedule regular health, dental, and eye exams.  Stay current with your immunizations.   Do not use any tobacco products including cigarettes, chewing tobacco, or electronic cigarettes.  If you are pregnant, do not drink alcohol.  If you are breastfeeding, limit how much and how often you drink alcohol.  Limit alcohol intake to no more than 1 drink per day for nonpregnant women. One drink equals 12 ounces of beer, 5 ounces of wine, or 1 ounces of hard liquor.  Do not use street drugs.  Do not share needles.  Ask your health care provider for help if  you need support or information about quitting drugs.  Tell your health care provider if you often feel depressed.  Tell your health care provider if you have ever been abused or do not feel safe at home.   This information is not intended to replace advice given to you by your health care provider. Make sure you discuss any questions you have with your health care provider.   Document Released: 12/11/2010 Document Revised: 06/18/2014 Document Reviewed: 04/29/2013 Elsevier Interactive Patient Education [employer].

## 2015-08-02 NOTE — Progress Notes (Signed)
Subjective:    Patient ID: [name] [name], female    DOB: 11-06-1932, 82 y.o.   MRN: [id number]  Pt presents to the office today for chronic follow up. Pt has pacemaker and is followed by her Cardiologist every 6 months. Diabetes She presents for her follow-up diabetic visit. She has type 2 diabetes mellitus. Her disease course has been fluctuating. Hypoglycemia symptoms include nervousness/anxiousness. Pertinent negatives for hypoglycemia include no confusion, dizziness, headaches or hunger. Associated symptoms include fatigue and foot paresthesias. Pertinent negatives for diabetes include no blurred vision and no visual change. Pertinent negatives for hypoglycemia complications include no blackouts and no hospitalization. Symptoms are worsening. Diabetic complications include peripheral neuropathy. Pertinent negatives for diabetic complications include no CVA, heart disease or nephropathy. Risk factors for coronary artery disease include diabetes mellitus, dyslipidemia, hypertension, obesity, post-menopausal and family history. Current diabetic treatment includes insulin injections and oral agent (dual therapy). She is following a generally unhealthy diet. Her breakfast blood glucose range is generally 110-130 mg/dl. An ACE inhibitor/angiotensin II receptor blocker is being taken. Eye exam is not current.  Hypertension This is a chronic problem. The current episode started more than 1 year ago. The problem has been resolved since onset. The problem is controlled. Associated symptoms include anxiety, peripheral edema and shortness of breath (At times). Pertinent negatives include no blurred vision, headaches or palpitations. Risk factors for coronary artery disease include diabetes mellitus, dyslipidemia, obesity and post-menopausal state. Past treatments include ACE inhibitors, beta blockers and calcium channel blockers. The current treatment provides significant improvement. Hypertensive end-organ damage  includes a thyroid problem. There is no history of kidney disease, CAD/MI, CVA or heart failure. There is no history of sleep apnea.  Hyperlipidemia This is a chronic problem. The current episode started more than 1 year ago. The problem is controlled. Recent lipid tests were reviewed and are normal. Exacerbating diseases include hypothyroidism. Associated symptoms include shortness of breath (At times). Pertinent negatives include no myalgias. Current antihyperlipidemic treatment includes statins. The current treatment provides significant improvement of lipids. Risk factors for coronary artery disease include diabetes mellitus, dyslipidemia, hypertension, obesity and post-menopausal.  Anxiety Presents for follow-up visit. Onset was more than 5 years ago. The problem has been waxing and waning. Symptoms include insomnia, nervous/anxious behavior and shortness of breath (At times). Patient reports no confusion, depressed mood, dizziness, excessive worry, irritability, nausea or palpitations. Symptoms occur constantly. The symptoms are aggravated by family issues. The quality of sleep is fair. Nighttime awakenings: occasional.   Her past medical history is significant for anxiety/panic attacks and CAD. There is no history of depression. Past treatments include benzodiazephines.  Gastroesophageal Reflux She complains of a hoarse voice. She reports no belching, no coughing, no heartburn, no nausea or no sore throat. This is a chronic problem. The current episode started more than 1 year ago. The problem occurs rarely. The problem has been resolved. The symptoms are aggravated by certain foods and lying down. Associated symptoms include fatigue. Risk factors include obesity. She has tried a PPI for the symptoms. The treatment provided significant relief.  Thyroid Problem Presents for follow-up visit. Symptoms include anxiety, fatigue and hoarse voice. Patient reports no constipation, depressed mood, diarrhea,  palpitations or visual change. The symptoms have been stable. Past treatments include levothyroxine. The treatment provided significant relief. Her past medical history is significant for hyperlipidemia. There is no history of heart failure.  OAB Pt currently taking myrrbetriq 25 daily. Pt states she continues to get up every hour  to "pee'.    Review of Systems  Constitutional: Positive for fatigue. Negative for irritability.  HENT: Positive for hoarse voice. Negative for sore throat.   Eyes: Negative.  Negative for blurred vision.  Respiratory: Positive for shortness of breath (At times). Negative for cough.   Cardiovascular: Negative.  Negative for palpitations.  Gastrointestinal: Negative.  Negative for heartburn, nausea, diarrhea and constipation.  Endocrine: Negative.   Genitourinary: Negative.   Musculoskeletal: Negative.  Negative for myalgias.  Neurological: Negative.  Negative for dizziness and headaches.  Hematological: Negative.   Psychiatric/Behavioral: Negative for confusion. The patient is nervous/anxious and has insomnia.   All other systems reviewed and are negative.      Objective:   Physical Exam  Constitutional: She is oriented to person, place, and time. She appears well-developed and well-nourished. No distress.  HENT:  Head: Normocephalic and atraumatic.  Eyes: Pupils are equal, round, and reactive to light.  Neck: Normal range of motion. Neck supple. No thyromegaly present.  Cardiovascular: Normal rate, regular rhythm, normal heart sounds and intact distal pulses.   No murmur heard. Pulmonary/Chest: Effort normal and breath sounds normal. No respiratory distress. She has no wheezes.  Abdominal: Soft. Bowel sounds are normal. She exhibits no distension. There is no tenderness.  Musculoskeletal: She exhibits edema (3+ in BLE). She exhibits no tenderness.  Neurological: She is alert and oriented to person, place, and time. She has normal reflexes. No cranial  nerve deficit.  Skin: Skin is warm and dry.  Psychiatric: She has a normal mood and affect. Her behavior is normal. Judgment and thought content normal.  Vitals reviewed.  BP 128/68 mmHg  Pulse 68  Temp(Src) 97.2 F (36.2 C) (Oral)  Ht '5\' 10"'  (1.778 m)  Wt 243 lb 12.8 oz (110.587 kg)  BMI 34.98 kg/m2     Assessment & Plan:  1. Essential hypertension - CMP14+EGFR  2. Chronic diastolic heart failure (HCC) - CMP14+EGFR  3. Arteriosclerotic cardiovascular disease (ASCVD) - CMP14+EGFR  4. Chronic obstructive pulmonary disease, unspecified COPD type (HCC) - CMP14+EGFR  5. Vitamin B 12 deficiency - CMP14+EGFR  6. Gastroesophageal reflux disease, esophagitis presence not specified - CMP14+EGFR  7. Hypothyroidism, unspecified hypothyroidism type - CMP14+EGFR - Thyroid Panel With TSH  8. Type 2 diabetes mellitus with hyperglycemia, with long-term current use of insulin (HCC) - POCT glycosylated hemoglobin (Hb A1C) - CMP14+EGFR  9. Type II diabetes mellitus with peripheral autonomic neuropathy (HCC) - CMP14+EGFR  10. Other polyneuropathy (HCC) - CMP14+EGFR - HYDROcodone-acetaminophen (NORCO/VICODIN) 5-325 MG tablet; TAKE (1) TABLET EVERY SIX HOURS AS NEEDED FOR MODERATE PAIN  Dispense: 60 tablet; Refill: 0  11. Osteoarthrosis, unspecified whether generalized or localized, involving lower leg - CMP14+EGFR - HYDROcodone-acetaminophen (NORCO/VICODIN) 5-325 MG tablet; TAKE (1) TABLET EVERY SIX HOURS AS NEEDED FOR MODERATE PAIN  Dispense: 60 tablet; Refill: 0  12. OAB (overactive bladder) -Myrbetriq 25 mg increased to 50 mg - CMP14+EGFR - mirabegron ER (MYRBETRIQ) 50 MG TB24 tablet; Take 1 tablet (50 mg total) by mouth daily.  Dispense: 90 tablet; Refill: 1  13. Hyperlipidemia - CMP14+EGFR - Lipid panel  14. Obesity - CMP14+EGFR  15. GAD (generalized anxiety disorder) - CMP14+EGFR  16. Insomnia - CMP14+EGFR  *Call pharmacists and reviewed patient's medication  list. Over 10 rx discontinued from patient med list. I reviewed this with the patient and patient's pharmacy.   Continue all meds Labs pending Health Maintenance reviewed Diet and exercise encouraged RTO 3 months  Evelina Dun, FNP

## 2015-08-03 LAB — LIPID PANEL
CHOLESTEROL TOTAL: 157 mg/dL (ref 100–199)
Chol/HDL Ratio: 2.5 ratio units (ref 0.0–4.4)
HDL: 62 mg/dL (ref >39)
LDL Calculated: 72 mg/dL (ref 0–99)
Triglycerides: 114 mg/dL (ref 0–149)
VLDL CHOLESTEROL CAL: 23 mg/dL (ref 5–40)

## 2015-08-03 LAB — CMP14+EGFR
A/G RATIO: 1.8 (ref 1.1–2.5)
ALBUMIN: 4.3 g/dL (ref 3.5–4.7)
ALK PHOS: 74 IU/L (ref 39–117)
ALT: 20 IU/L (ref 0–32)
AST: 24 IU/L (ref 0–40)
BILIRUBIN TOTAL: 0.3 mg/dL (ref 0.0–1.2)
BUN / CREAT RATIO: 13 (ref 11–26)
BUN: 13 mg/dL (ref 8–27)
CHLORIDE: 97 mmol/L (ref 96–106)
CO2: 28 mmol/L (ref 18–29)
Calcium: 10.4 mg/dL — ABNORMAL HIGH (ref 8.7–10.3)
Creatinine, Ser: 1.02 mg/dL — ABNORMAL HIGH (ref 0.57–1.00)
GFR calc Af Amer: 60 mL/min/{1.73_m2} (ref >59)
GFR calc non Af Amer: 52 mL/min/{1.73_m2} — ABNORMAL LOW (ref >59)
GLUCOSE: 79 mg/dL (ref 65–99)
Globulin, Total: 2.4 g/dL (ref 1.5–4.5)
POTASSIUM: 5.3 mmol/L — AB (ref 3.5–5.2)
Sodium: 139 mmol/L (ref 134–144)
Total Protein: 6.7 g/dL (ref 6.0–8.5)

## 2015-08-03 LAB — THYROID PANEL WITH TSH
FREE THYROXINE INDEX: 1.7 (ref 1.2–4.9)
T3 Uptake Ratio: 27 % (ref 24–39)
T4 TOTAL: 6.3 ug/dL (ref 4.5–12.0)
TSH: 14.18 u[IU]/mL — ABNORMAL HIGH (ref 0.450–4.500)

## 2015-08-18 ENCOUNTER — Other Ambulatory Visit: Payer: Self-pay | Admitting: Family

## 2015-08-18 ENCOUNTER — Other Ambulatory Visit: Payer: Self-pay | Admitting: Family Medicine

## 2015-08-18 NOTE — Telephone Encounter (Signed)
Last filled 07/22/15, last seen 08/02/15. Route to pool[facility]

## 2015-08-19 ENCOUNTER — Ambulatory Visit (INDEPENDENT_AMBULATORY_CARE_PROVIDER_SITE_OTHER): Payer: Medicare Other | Admitting: Internal Medicine

## 2015-08-19 ENCOUNTER — Encounter: Payer: Self-pay | Admitting: Internal Medicine

## 2015-08-19 VITALS — BP 128/68 | HR 88 | Ht 70.0 in | Wt 239.0 lb

## 2015-08-19 DIAGNOSIS — I10 Essential (primary) hypertension: Secondary | ICD-10-CM

## 2015-08-19 NOTE — Telephone Encounter (Signed)
Xanax refills called to pharmacy. 

## 2015-08-19 NOTE — Patient Instructions (Signed)
Your physician wants you to follow-up in: 1 year with Dr. Ladona Ridgelaylor. You will receive a reminder letter in the mail two months in advance. If you don't receive a letter, please call our office to schedule the follow-up appointment.  Your physician recommends that you schedule a follow-up appointment in: 6 months with [facility]  Your physician recommends that you continue on your current medications as directed. Please refer to the Current Medication list given to you today.  If you need a refill on your cardiac medications before your next appointment, please call your pharmacy.  Thank you for choosing [facility]!

## 2015-08-19 NOTE — Progress Notes (Signed)
HPI [name] [name] returns today for followup. She is a very pleasant 82 year old woman with a history of multiple medical problems including symptomatic bradycardia due to sinus node dysfunction and complete heart block, status post permanent pacemaker insertion.  She denies syncope, chest pain, but does have peripheral edema. She denies syncope. She admits to dietary indiscretion with sodium. She is still dancing with her husband. Allergies  Allergen Reactions  . Penicillins      Current Outpatient Prescriptions  Medication Sig Dispense Refill  . ALPRAZolam (XANAX) 0.5 MG tablet TAKE 1 TABLET TWICE DAILY AS NEEDED FOR ANXIETY 60 tablet 3  . amLODipine-benazepril (LOTREL) 10-20 MG capsule TAKE (1) CAPSULE DAILY 90 capsule 0  . aspirin EC 81 MG tablet Take 81 mg by mouth daily.    . ASSURE COMFORT LANCETS 30G MISC     . Blood Glucose Calibration (ACCU-CHEK SMARTVIEW CONTROL) LIQD     . Blood Glucose Monitoring Suppl (ACCU-CHEK NANO SMARTVIEW) W/DEVICE KIT USE TWICE DAILY 1 kit 0  . cyclobenzaprine (FLEXERIL) 10 MG tablet Take 1 tablet (10 mg total) by mouth 3 (three) times daily as needed f or muscle spasms. 30 tablet 2  . furosemide (LASIX) 40 MG tablet Reported on 07/05/2015    . gabapentin (NEURONTIN) 300 MG capsule Take 1 capsule (300 mg total) by mouth 3 (three) times daily. 270 capsule 1  . glimepiride (AMARYL) 4 MG tablet TAKE (1) TABLET DAILY BE- FORE BREAKFAST. 90 tablet 0  . glucose blood test strip Use TID and prn 100 each 12  . HYDROcodone-acetaminophen (NORCO/VICODIN) 5-325 MG tablet TAKE (1) TABLET EVERY SIX HOURS AS NEEDED FOR MODERATE PAIN 60 tablet 0  . insulin NPH Human (HUMULIN N) 100 UNIT/ML injection Inject 10 Units into the skin 2 (two) times daily.  10 mL 1  . levothyroxine (SYNTHROID, LEVOTHROID) 200 MCG tablet TAKE (1) TABLET DAILY BE- FORE BREAKFAST. 90 tablet 3  . metFORMIN (GLUCOPHAGE) 1000 MG tablet TAKE (1) TABLET TWICE A DAY WITH MEALS (BREAKFAST AND SUPPER) FOR  DIAB ETES 180 tablet 0  . metoprolol succinate (TOPROL-XL) 50 MG 24 hr tablet TAKE 1 TABLET ONCE DAILY AFTER MEAL 90 tablet 0  . mirabegron ER (MYRBETRIQ) 50 MG TB24 tablet Take 1 tablet (50 mg total) by mouth daily. 90 tablet 1  . nitroGLYCERIN (NITROLINGUAL) 0.4 MG/SPRAY spray Place 1 spray under the tongue every 5 (five) minutes as needed. 12 g 3  . Nystatin (NYAMYC) 100000 UNIT/GM POWD APPLY TO AFFECTED AREA 3 TIMES A DAY 60 g 3  . nystatin-triamcinolone (MYCOLOG II) cream APPLY TO AFFECTED AREA 3 TIMES A DAY 60 g 0  . omeprazole (PRILOSEC) 20 MG capsule TAKE (1) CAPSULE DAILY 90 capsule 3  . pravastatin (PRAVACHOL) 80 MG tablet TAKE 1 TABLET ONCE DAILY FOR CHOLESTEROL 90 tablet 0  . PROAIR HFA 108 (90 Base) MCG/ACT inhaler 2 PUFFS EVERY 6 HOURS AS NEEDED FOR WHEEZING 25.5 g 0  . SURE COMFORT INSULIN SYRINGE 31G X 5/16" 0.3 ML MISC     . SYMBICORT 80-4.5 MCG/ACT inhaler Inhale 2 puffs into the lungs 2 (two) times daily. 10.2 g 3   No current facility-administered medications for this visit.     Past Medical History  Diagnosis Date  . Arteriosclerotic cardiovascular disease (ASCVD)     Cath in 9/99-70% mid LAD with diffuse distal disease, 70% T1, 60% mid circumflex, 50% mid RCA, normal ejection fraction; negative stress nuclear in 07/2008  . Sinoatrial node dysfunction ([place]) 2003  Medtronic dual-chamber device  . Cerebrovascular disease     carotid bruits; no focal disease in 1999, 2002 and 2006  . ANXIETY   . Diastolic CHF, chronic (HCC)     Normal EF  . COPD   . Hyperlipidemia   . Hypertension   . Hypothyroidism   . Gastroesophageal reflux disease     With hiatal hernia; irritable bowel syndrome; H. pylori-treated; Colonoscopy 2008: non-specific colitis, IH, Diverticulosis, Rectal ulcer secondary to ASA  . OSTEOARTHRITIS, KNEES, BILATERAL     Bilateral TKA  . VITAMIN B12 DEFICIENCY   . Carpal tunnel syndrome   . Peripheral neuropathy ([place])   . Diabetes mellitus      Insulin treatment  . Pacemaker   . Closed left fibular fracture 11/30/11  . Sleep apnea     ROS:   All systems reviewed and negative except as noted in the HPI.   Past Surgical History  Procedure Laterality Date  . Total knee arthroplasty      Bilateral  . Nasal fracture surgery    . Cholecystectomy    . Abdominal hysterectomy    . Hemorrhoid surgery    . Carpal tunnel release    . Thyroidectomy  1980    Goiter  . Bladder repair    . Cardiac pacemaker placement  2003    Medtronic, dual-chamber  . Colonoscopy  2008     Family History  Problem Relation Age of Onset  . COPD Father   . Heart failure Mother   . Cancer Sister     colon  . Cancer Brother     lung  . Cancer Sister     bladder  . Cancer Brother     lung  . Early death Son   . Diabetes Son      Social History   Social History  . Marital Status: Married    Spouse Name: husband has dementia  . Number of Children: N/A  . Years of Education: 8   Occupational History  . retired    Social History Main Topics  . Smoking status: Never Smoker   . Smokeless tobacco: Never Used  . Alcohol Use: No  . Drug Use: No  . Sexual Activity: Not Currently   Other Topics Concern  . Not on file   Social History Narrative     BP 128/68 mmHg  Pulse 88  Ht '5\' 10"'  (1.778 m)  Wt 239 lb (108.41 kg)  BMI 34.29 kg/m2  SpO2 94%  Physical Exam:  Obese appearing elderly woman, NAD HEENT: Unremarkable Neck:  6 cm JVD, no thyromegally Lungs:  Clear with no wheezes, rales, or rhonchi. Well-healed pacemaker incision HEART:  Regular rate rhythm, no murmurs, no rubs, no clicks Abd:  soft, positive bowel sounds, no organomegally, no rebound, no guarding Ext:  2 plus pulses, 2+ peripheral edema, no cyanosis, no clubbing Skin:  No rashes no nodules Neuro:  CN II through XII intact, motor grossly intact  ECG - NSR with AV pacing  DEVICE  Normal device function.  See PaceArt for details.   Assess/Plan:  1.  Complete heart block - she is asymptomatic, s/p PPM insertion 2. HTN - her blood pressure is well controlled. She is encouraged to maintain a low sodium diet. 3. PPM - her medtronic DDD PM is working normally. Will recheck in several months.  Cristopher Peru, M.D.

## 2015-08-22 ENCOUNTER — Other Ambulatory Visit: Payer: Self-pay | Admitting: Family

## 2015-08-25 ENCOUNTER — Other Ambulatory Visit: Payer: Self-pay | Admitting: Family

## 2015-08-26 ENCOUNTER — Ambulatory Visit (INDEPENDENT_AMBULATORY_CARE_PROVIDER_SITE_OTHER): Payer: Medicare Other | Admitting: Pharmacist

## 2015-08-26 ENCOUNTER — Ambulatory Visit (INDEPENDENT_AMBULATORY_CARE_PROVIDER_SITE_OTHER): Payer: Medicare Other

## 2015-08-26 ENCOUNTER — Ambulatory Visit (INDEPENDENT_AMBULATORY_CARE_PROVIDER_SITE_OTHER): Payer: Medicare Other | Admitting: Family Medicine

## 2015-08-26 ENCOUNTER — Encounter: Payer: Self-pay | Admitting: Family Medicine

## 2015-08-26 ENCOUNTER — Ambulatory Visit: Payer: Self-pay | Admitting: Pharmacist

## 2015-08-26 ENCOUNTER — Encounter (INDEPENDENT_AMBULATORY_CARE_PROVIDER_SITE_OTHER): Payer: Self-pay

## 2015-08-26 VITALS — BP 145/76 | HR 76 | Temp 97.2°F

## 2015-08-26 DIAGNOSIS — R1032 Left lower quadrant pain: Secondary | ICD-10-CM

## 2015-08-26 DIAGNOSIS — R1112 Projectile vomiting: Secondary | ICD-10-CM

## 2015-08-26 DIAGNOSIS — R6889 Other general symptoms and signs: Secondary | ICD-10-CM

## 2015-08-26 LAB — URINALYSIS
Bilirubin, UA: NEGATIVE
GLUCOSE, UA: NEGATIVE
KETONES UA: NEGATIVE
Leukocytes, UA: NEGATIVE
Nitrite, UA: NEGATIVE
PROTEIN UA: NEGATIVE
RBC, UA: NEGATIVE
Specific Gravity, UA: 1.01 (ref 1.005–1.030)
UUROB: 0.2 mg/dL (ref 0.2–1.0)
pH, UA: 6.5 (ref 5.0–7.5)

## 2015-08-26 LAB — VERITOR FLU A/B WAIVED
Influenza A: NEGATIVE
Influenza B: NEGATIVE

## 2015-08-26 MED ORDER — METRONIDAZOLE 500 MG PO TABS: 500.0000 mg | ORAL_TABLET | Freq: Two times a day (BID) | ORAL | Status: DC

## 2015-08-26 MED ORDER — ONDANSETRON 4 MG PO TBDP
4.0000 mg | ORAL_TABLET | Freq: Once | ORAL | Status: AC
  Administered 2015-08-26: 4 mg via ORAL

## 2015-08-26 MED ORDER — ONDANSETRON 8 MG PO TBDP: 8.0000 mg | ORAL_TABLET | Freq: Four times a day (QID) | ORAL | Status: DC | PRN

## 2015-08-26 MED ORDER — CIPROFLOXACIN HCL 500 MG PO TABS: 500.0000 mg | ORAL_TABLET | Freq: Two times a day (BID) | ORAL | Status: DC

## 2015-08-26 NOTE — Progress Notes (Signed)
Patient with nausea and vomiting for last 3 days.  Vomited once in office.  Triaged to Dr Darlyn ReadStacks.

## 2015-08-26 NOTE — Progress Notes (Signed)
Subjective:  Patient ID: [name] [name], female    DOB: April 12, 1933  Age: 82 y.o. MRN: [id number]  CC: Generalized Body Aches; Emesis; and Chills   HPI [name] [name] presents for 3 days of diarrhea LBM TNTC. Vomiting also. Poor tolerance of P.O. Left temple pain. She is prostrate with weakness. She is experiencing pain she points to the left lower quadrant near the suprapubic region. She denies any radiation. The pain is moderate in intensity. It is an ache but not has cramps. She denies dysuria or frequency of urination. She has had chills and subjective fever. Onset of all the symptoms 3 days ago at the same time as the diarrhea. Bowel movements are brown in color and watery in consistency. Associated with some diffuse abdominal cramping but no tenesmus or hematochezia.   History [name] [name] has a past medical history of Arteriosclerotic cardiovascular disease (ASCVD); Sinoatrial node dysfunction ([place]) (2003); Cerebrovascular disease; ANXIETY; Diastolic CHF, chronic ([place]); COPD; Hyperlipidemia; Hypertension; Hypothyroidism; Gastroesophageal reflux disease; OSTEOARTHRITIS, KNEES, BILATERAL; VITAMIN B12 DEFICIENCY; Carpal tunnel syndrome; Peripheral neuropathy ([place]); Diabetes mellitus; Pacemaker; Closed left fibular fracture (11/30/11); and Sleep apnea.   She has past surgical history that includes Total knee arthroplasty; Nasal fracture surgery; Cholecystectomy; Abdominal hysterectomy; Hemorrhoid surgery; Carpal tunnel release; Thyroidectomy (1980); Bladder repair; Cardiac pacemaker placement (2003); and Colonoscopy (2008).   Her family history includes COPD in her father; Cancer in her brother, brother, sister, and sister; Diabetes in her son; Early death in her son; Heart failure in her mother.She reports that she has never smoked. She has never used smokeless tobacco. She reports that she does not drink alcohol or use illicit drugs.    ROS Review of Systems  Constitutional: Positive for fever, chills  and diaphoresis. Negative for activity change and appetite change.  HENT: Negative for congestion, rhinorrhea and sore throat.   Eyes: Negative for visual disturbance.  Respiratory: Negative for cough and shortness of breath.   Cardiovascular: Negative for chest pain and palpitations.  Gastrointestinal: Positive for nausea, vomiting, abdominal pain, diarrhea and abdominal distention. Negative for blood in stool and rectal pain.  Genitourinary: Negative for dysuria.  Musculoskeletal: Negative for myalgias and arthralgias.    Objective:  BP 145/76 mmHg  Pulse 76  Temp(Src) 97.2 F (36.2 C) (Oral)  BP Readings from Last 3 Encounters:  08/26/15 145/76  08/19/15 128/68  08/02/15 128/68    Wt Readings from Last 3 Encounters:  08/19/15 239 lb (108.41 kg)  08/02/15 243 lb 12.8 oz (110.587 kg)  07/05/15 236 lb 3.2 oz (107.14 kg)     Physical Exam  Constitutional: She is oriented to person, place, and time. She appears well-developed and well-nourished. She appears distressed (looks weak and tired).  HENT:  Head: Normocephalic and atraumatic.  Right Ear: External ear normal.  Left Ear: External ear normal.  Nose: Nose normal.  Mouth/Throat: Oropharynx is clear and moist.  Eyes: Conjunctivae and EOM are normal. Pupils are equal, round, and reactive to light.  Neck: Normal range of motion. Neck supple. No thyromegaly present.  Cardiovascular: Normal rate, regular rhythm and normal heart sounds.   No murmur heard. Pulmonary/Chest: Effort normal and breath sounds normal. No respiratory distress. She has no wheezes. She has no rales.  Abdominal: Soft. Bowel sounds are normal. She exhibits no distension. There is tenderness (diffuse over her abdomen and more at the left lower quadrant).  Lymphadenopathy:    She has no cervical adenopathy.  Neurological: She is alert and oriented to person,  place, and time. She has normal reflexes.  Skin: Skin is warm and dry.  Psychiatric: She has a  normal mood and affect. Her behavior is normal. Judgment and thought content normal.     Lab Results  Component Value Date   WBC 8.8 01/11/2015   HGB 13.4 04/16/2014   HCT 43.6 01/11/2015   PLT 268 01/11/2015   GLUCOSE 79 08/02/2015   CHOL 157 08/02/2015   TRIG 114 08/02/2015   HDL 62 08/02/2015   LDLCALC 72 08/02/2015   ALT 20 08/02/2015   AST 24 08/02/2015   NA 139 08/02/2015   K 5.3* 08/02/2015   CL 97 08/02/2015   CREATININE 1.02* 08/02/2015   BUN 13 08/02/2015   CO2 28 08/02/2015   TSH 14.180* 08/02/2015   INR 1.07 12/04/2010   HGBA1C 6.4 08/02/2015   MICROALBUR 1.1 12/23/2012    US Carotid Duplex Bilateral  07/30/2014  CLINICAL DATA:  Bilateral carotid artery stenosis. History of hyperlipidemia, visual disturbance, hypertension and diabetes. EXAM: BILATERAL CAROTID DUPLEX ULTRASOUND TECHNIQUE: Pearline Cables scale imaging, color Doppler and duplex ultrasound were performed of bilateral carotid and vertebral arteries in the neck. COMPARISON:  Carotid Doppler ultrasound - 12/18/2012; 05/27/2012 FINDINGS: Criteria: Quantification of carotid stenosis is based on velocity parameters that correlate the residual internal carotid diameter with NASCET-based stenosis levels, using the diameter of the distal internal carotid lumen as the denominator for stenosis measurement. The following velocity measurements were obtained: RIGHT ICA:  177/31 cm/sec CCA:  38/18 cm/sec SYSTOLIC ICA/CCA RATIO:  2.3 DIASTOLIC ICA/CCA RATIO:  3.0 ECA:  107 cm/sec LEFT ICA:  177/58 cm/sec CCA:  29/93 cm/sec SYSTOLIC ICA/CCA RATIO:  2.1 DIASTOLIC ICA/CCA RATIO:  3.3 ECA:  86 cm/sec RIGHT CAROTID ARTERY: There is a minimal amount of eccentric scattered foci of echogenic plaque throughout the right common carotid artery (representative images 9, 14 and 15). There is a moderate amount of eccentric mixed echogenic plaque within the right carotid bulb (images 23 and and 25). There is eccentric echogenic shadowing plaque  involving the origin and proximal aspect of the right internal carotid artery (image 34), morphologically similar to the prior examination and again resulting in elevated peak systolic velocities within the proximal aspect of the right internal carotid artery (measuring 177 cm/sec - image 36, previously 225 cm/sec). RIGHT VERTEBRAL ARTERY:  Antegrade flow LEFT CAROTID ARTERY: There is a minimal amount of eccentric echogenic plaque throughout the left common carotid artery (representative images 49 and 54. There is a moderate amount of eccentric mixed echogenic plaque within the left carotid bulb (images 61 and 63), extending to involve the origin and proximal aspects of the left internal carotid artery (image 73), morphologically similar to the prior examination and again resulting in elevated peak systolic velocities within the proximal aspect of the left internal carotid artery (greatest acquire peak systolic velocity with the left internal carotid artery measures 177 cm/sec - image 75, previously, 188 cm/sec. LEFT VERTEBRAL ARTERY:  Antegrade flow IMPRESSION: Grossly unchanged moderate amount of bilateral atherosclerotic plaque, again resulting in elevated peak systolic velocities within the bilateral internal carotid arteries compatible with the 50-69% luminal narrowing range bilaterally. Electronically Signed   By: Sandi Mariscal M.D.   On: 07/30/2014 11:00    Assessment & Plan:   [name] [name] was seen today for generalized body aches, emesis and chills.  Diagnoses and all orders for this visit:  Flu-like symptoms -     Veritor Flu A/B Waived -     ondansetron (ZOFRAN-ODT)  disintegrating tablet 4 mg; Take 1 tablet (4 mg total) by mouth once.  Left lower quadrant pain -     DG Abd 2 Views; Future -     Amylase -     CBC with Differential/Platelet -     CMP14+EGFR -     Lipase -     Cancel: POCT urinalysis dipstick -     Urinalysis -     ondansetron (ZOFRAN-ODT) disintegrating tablet 4 mg; Take 1 tablet  (4 mg total) by mouth once.  Other orders -     ondansetron (ZOFRAN-ODT) 8 MG disintegrating tablet; Take 1 tablet (8 mg total) by mouth every 6 (six) hours as needed for nausea or vomiting. -     ciprofloxacin (CIPRO) 500 MG tablet; Take 1 tablet (500 mg total) by mouth 2 (two) times daily. -     metroNIDAZOLE (FLAGYL) 500 MG tablet; Take 1 tablet (500 mg total) by mouth 2 (two) times daily.    The abdominal and GI components seem to rule out a respiratory-based flu. I believe this is more likely to be diverticular in origin based on the left lower quadrant orientation. However the entire syndrome may be viral. She is not at this point appearing to be dehydrated although that could easily happened she was encouraged to use the ondansetron regularly and continually sip fluids.  I am having Ms. Crusoe start on ondansetron, ciprofloxacin, and metroNIDAZOLE. I am also having her maintain her aspirin EC, nitroGLYCERIN, ACCU-CHEK SMARTVIEW CONTROL, ACCU-CHEK NANO SMARTVIEW, insulin NPH Human, ASSURE COMFORT LANCETS 30G, SURE COMFORT INSULIN SYRINGE, cyclobenzaprine, omeprazole, levothyroxine, glucose blood, metoprolol succinate, furosemide, NYAMYC, amLODipine-benazepril, glimepiride, pravastatin, SYMBICORT, PROAIR HFA, HYDROcodone-acetaminophen, mirabegron ER, nystatin-triamcinolone, ALPRAZolam, gabapentin, metFORMIN, and triamcinolone cream. We administered ondansetron.  Meds ordered this encounter  Medications  . triamcinolone cream (KENALOG) 0.1 %    Sig:   . ondansetron (ZOFRAN-ODT) disintegrating tablet 4 mg    Sig:   . ondansetron (ZOFRAN-ODT) 8 MG disintegrating tablet    Sig: Take 1 tablet (8 mg total) by mouth every 6 (six) hours as needed for nausea or vomiting.    Dispense:  20 tablet    Refill:  1  . ciprofloxacin (CIPRO) 500 MG tablet    Sig: Take 1 tablet (500 mg total) by mouth 2 (two) times daily.    Dispense:  14 tablet    Refill:  0  . metroNIDAZOLE (FLAGYL) 500 MG tablet     Sig: Take 1 tablet (500 mg total) by mouth 2 (two) times daily.    Dispense:  14 tablet    Refill:  0     Follow-up: Return if symptoms worsen or fail to improve.  Claretta Fraise, M.D.

## 2015-08-26 NOTE — Patient Instructions (Signed)
  Ms. Kelby FamFlippin , Thank you for taking time to come for your Medicare Wellness Visit. I appreciate your ongoing commitment to your health goals. Please review the following plan we discussed and let me know if I can assist you in the future.   These are the goals we discussed: Goals    . Reduce sodium intake     Limit added salt and high salt foods to help with swelling and blood pressure    . Reduce sugar and high carbohydrate intake     Limit serving sizes to 1/2 cup of potatoes,fruit, corn, peas and beans. Increase non starchy vegetables - greens beans, broccoli, cauliflower, carrots, greens, lettuce, cabbage, onions, pepper, asparagus and cucumbers.       This is a list of the screening recommended for you and due dates:  Health Maintenance  Topic Date Due  . Eye exam for diabetics  08/23/2015  . Flu Shot  01/10/2016  . Hemoglobin A1C  01/30/2016  . Complete foot exam   05/01/2016  . DEXA scan (bone density measurement)  08/10/2016  . Tetanus Vaccine  08/30/2019  . Shingles Vaccine  Completed  . Pneumonia vaccines  Completed

## 2015-08-27 LAB — CMP14+EGFR
A/G RATIO: 1.6 (ref 1.2–2.2)
ALBUMIN: 4.4 g/dL (ref 3.5–4.7)
ALT: 24 IU/L (ref 0–32)
AST: 20 IU/L (ref 0–40)
Alkaline Phosphatase: 94 IU/L (ref 39–117)
BILIRUBIN TOTAL: 0.3 mg/dL (ref 0.0–1.2)
BUN / CREAT RATIO: 16 (ref 11–26)
BUN: 15 mg/dL (ref 8–27)
CALCIUM: 10.3 mg/dL (ref 8.7–10.3)
CHLORIDE: 96 mmol/L (ref 96–106)
CO2: 26 mmol/L (ref 18–29)
Creatinine, Ser: 0.94 mg/dL (ref 0.57–1.00)
GFR, EST AFRICAN AMERICAN: 66 mL/min/{1.73_m2} (ref >59)
GFR, EST NON AFRICAN AMERICAN: 57 mL/min/{1.73_m2} — AB (ref >59)
Globulin, Total: 2.8 g/dL (ref 1.5–4.5)
Glucose: 119 mg/dL — ABNORMAL HIGH (ref 65–99)
POTASSIUM: 4.6 mmol/L (ref 3.5–5.2)
Sodium: 139 mmol/L (ref 134–144)
TOTAL PROTEIN: 7.2 g/dL (ref 6.0–8.5)

## 2015-08-27 LAB — CBC WITH DIFFERENTIAL/PLATELET
BASOS ABS: 0.1 10*3/uL (ref 0.0–0.2)
Basos: 1 %
EOS (ABSOLUTE): 0.3 10*3/uL (ref 0.0–0.4)
EOS: 3 %
HEMATOCRIT: 41.6 % (ref 34.0–46.6)
Hemoglobin: 13.8 g/dL (ref 11.1–15.9)
IMMATURE GRANULOCYTES: 0 %
Immature Grans (Abs): 0 10*3/uL (ref 0.0–0.1)
LYMPHS ABS: 3 10*3/uL (ref 0.7–3.1)
Lymphs: 38 %
MCH: 30.3 pg (ref 26.6–33.0)
MCHC: 33.2 g/dL (ref 31.5–35.7)
MCV: 91 fL (ref 79–97)
MONOS ABS: 0.5 10*3/uL (ref 0.1–0.9)
Monocytes: 6 %
NEUTROS PCT: 52 %
Neutrophils Absolute: 4.3 10*3/uL (ref 1.4–7.0)
PLATELETS: 278 10*3/uL (ref 150–379)
RBC: 4.55 x10E6/uL (ref 3.77–5.28)
RDW: 13.5 % (ref 12.3–15.4)
WBC: 8.1 10*3/uL (ref 3.4–10.8)

## 2015-08-27 LAB — AMYLASE: AMYLASE: 39 U/L (ref 31–124)

## 2015-08-27 LAB — LIPASE: Lipase: 12 U/L (ref 0–59)

## 2015-09-01 ENCOUNTER — Other Ambulatory Visit: Payer: Self-pay | Admitting: Family

## 2015-09-01 DIAGNOSIS — M1710 Unilateral primary osteoarthritis, unspecified knee: Secondary | ICD-10-CM

## 2015-09-01 DIAGNOSIS — IMO0002 Reserved for concepts with insufficient information to code with codable children: Secondary | ICD-10-CM

## 2015-09-01 DIAGNOSIS — G6289 Other specified polyneuropathies: Secondary | ICD-10-CM

## 2015-09-01 MED ORDER — HYDROCODONE-ACETAMINOPHEN 5-325 MG PO TABS: ORAL_TABLET | ORAL | Status: DC

## 2015-09-01 MED ORDER — HYDROCODONE-ACETAMINOPHEN 5-325 MG PO TABS: 1.0000 | ORAL_TABLET | Freq: Four times a day (QID) | ORAL | Status: DC | PRN

## 2015-09-03 ENCOUNTER — Other Ambulatory Visit: Payer: Self-pay | Admitting: Family Medicine

## 2015-09-06 ENCOUNTER — Ambulatory Visit: Payer: Medicare Other | Admitting: Family

## 2015-09-07 ENCOUNTER — Telehealth: Payer: Self-pay | Admitting: Family

## 2015-09-07 NOTE — Telephone Encounter (Signed)
FYI

## 2015-09-09 LAB — CUP PACEART INCLINIC DEVICE CHECK
Date Time Interrogation Session: 20170331123213
Implantable Lead Implant Date: 20030716
Implantable Lead Location: 753859
Implantable Lead Model: 5076
MDC IDC LEAD IMPLANT DT: 20030716
MDC IDC LEAD LOCATION: 753860

## 2015-09-16 ENCOUNTER — Other Ambulatory Visit: Payer: Self-pay | Admitting: Family

## 2015-10-03 ENCOUNTER — Other Ambulatory Visit: Payer: Self-pay | Admitting: Family

## 2015-10-04 ENCOUNTER — Other Ambulatory Visit: Payer: Self-pay | Admitting: Family

## 2015-10-07 ENCOUNTER — Other Ambulatory Visit: Payer: Self-pay | Admitting: Family

## 2015-10-15 ENCOUNTER — Other Ambulatory Visit: Payer: Self-pay | Admitting: Family

## 2015-10-24 ENCOUNTER — Ambulatory Visit: Payer: Medicare Other | Admitting: Family

## 2015-10-28 ENCOUNTER — Other Ambulatory Visit: Payer: Self-pay

## 2015-10-28 MED ORDER — NITROGLYCERIN 0.4 MG/SPRAY TL SOLN: 1.0000 | Status: DC | PRN

## 2015-11-14 ENCOUNTER — Other Ambulatory Visit: Payer: Self-pay | Admitting: Family Medicine

## 2015-11-14 ENCOUNTER — Other Ambulatory Visit: Payer: Self-pay | Admitting: Family

## 2015-11-14 NOTE — Telephone Encounter (Signed)
pleae review and advise Last seen 08/02/2015

## 2015-11-18 ENCOUNTER — Other Ambulatory Visit: Payer: Self-pay | Admitting: Family

## 2015-11-18 NOTE — Telephone Encounter (Signed)
Okay to do one refill of each, but it appears that she needs a visit

## 2015-11-18 NOTE — Telephone Encounter (Signed)
Last seen 2.21.17

## 2015-11-30 ENCOUNTER — Other Ambulatory Visit: Payer: Self-pay | Admitting: Family

## 2015-11-30 NOTE — Telephone Encounter (Signed)
Last seen 08/26/15  Dr Darlyn ReadStacks  If approved print

## 2015-11-30 NOTE — Telephone Encounter (Signed)
Nees to be seen  prior to her refill of controlled substance.  Murtis SinkSam Cecylia Brazill, MD [facility] 11/30/2015, 4:00 PM

## 2015-12-01 NOTE — Telephone Encounter (Signed)
Patient has appt 6/23 

## 2015-12-02 ENCOUNTER — Encounter: Payer: Self-pay | Admitting: Family

## 2015-12-02 ENCOUNTER — Ambulatory Visit (INDEPENDENT_AMBULATORY_CARE_PROVIDER_SITE_OTHER): Payer: Medicare Other | Admitting: Family

## 2015-12-02 VITALS — BP 158/73 | HR 67 | Temp 97.4°F | Ht 70.0 in | Wt 234.0 lb

## 2015-12-02 DIAGNOSIS — E1143 Type 2 diabetes mellitus with diabetic autonomic (poly)neuropathy: Secondary | ICD-10-CM

## 2015-12-02 DIAGNOSIS — J449 Chronic obstructive pulmonary disease, unspecified: Secondary | ICD-10-CM

## 2015-12-02 DIAGNOSIS — E669 Obesity, unspecified: Secondary | ICD-10-CM | POA: Diagnosis not present

## 2015-12-02 DIAGNOSIS — F411 Generalized anxiety disorder: Secondary | ICD-10-CM

## 2015-12-02 DIAGNOSIS — G6289 Other specified polyneuropathies: Secondary | ICD-10-CM

## 2015-12-02 DIAGNOSIS — I10 Essential (primary) hypertension: Secondary | ICD-10-CM | POA: Diagnosis not present

## 2015-12-02 DIAGNOSIS — M1710 Unilateral primary osteoarthritis, unspecified knee: Secondary | ICD-10-CM

## 2015-12-02 DIAGNOSIS — K219 Gastro-esophageal reflux disease without esophagitis: Secondary | ICD-10-CM

## 2015-12-02 DIAGNOSIS — E538 Deficiency of other specified B group vitamins: Secondary | ICD-10-CM

## 2015-12-02 DIAGNOSIS — M179 Osteoarthritis of knee, unspecified: Secondary | ICD-10-CM

## 2015-12-02 DIAGNOSIS — N3281 Overactive bladder: Secondary | ICD-10-CM | POA: Diagnosis not present

## 2015-12-02 DIAGNOSIS — B372 Candidiasis of skin and nail: Secondary | ICD-10-CM

## 2015-12-02 DIAGNOSIS — IMO0002 Reserved for concepts with insufficient information to code with codable children: Secondary | ICD-10-CM

## 2015-12-02 DIAGNOSIS — G4700 Insomnia, unspecified: Secondary | ICD-10-CM

## 2015-12-02 DIAGNOSIS — E785 Hyperlipidemia, unspecified: Secondary | ICD-10-CM

## 2015-12-02 DIAGNOSIS — I5032 Chronic diastolic (congestive) heart failure: Secondary | ICD-10-CM

## 2015-12-02 DIAGNOSIS — E039 Hypothyroidism, unspecified: Secondary | ICD-10-CM | POA: Diagnosis not present

## 2015-12-02 LAB — BAYER DCA HB A1C WAIVED: HB A1C (BAYER DCA - WAIVED): 6.8 % (ref <7.0)

## 2015-12-02 MED ORDER — HYDROCODONE-ACETAMINOPHEN 5-325 MG PO TABS: 1.0000 | ORAL_TABLET | Freq: Four times a day (QID) | ORAL | Status: DC | PRN

## 2015-12-02 MED ORDER — HYDROCODONE-ACETAMINOPHEN 5-325 MG PO TABS: ORAL_TABLET | ORAL | Status: DC

## 2015-12-02 MED ORDER — NYSTATIN 100000 UNIT/GM EX POWD: Freq: Two times a day (BID) | CUTANEOUS | Status: DC

## 2015-12-02 MED ORDER — NYSTATIN-TRIAMCINOLONE 100000-0.1 UNIT/GM-% EX CREA: TOPICAL_CREAM | CUTANEOUS | Status: DC

## 2015-12-02 MED ORDER — FLUCONAZOLE 150 MG PO TABS: 150.0000 mg | ORAL_TABLET | Freq: Once | ORAL | Status: DC

## 2015-12-02 NOTE — Addendum Note (Signed)
Addended by: Jannifer RodneyHAWKS, Nathanel Tallman A on: 12/02/2015 03:38 PM   Modules accepted: Orders, SmartSet

## 2015-12-02 NOTE — Patient Instructions (Signed)
Cutaneous Candidiasis °Cutaneous candidiasis is a condition in which there is an overgrowth of yeast (candida) on the skin. Yeast normally live on the skin, but in small enough numbers not to cause any symptoms. In certain cases, increased growth of the yeast may cause an actual yeast infection. This kind of infection usually occurs in areas of the skin that are constantly warm and moist, such as the armpits or the groin. Yeast is the most common cause of diaper rash in babies and in people who cannot control their bowel movements (incontinence). °CAUSES  °The fungus that most often causes cutaneous candidiasis is Candida albicans. Conditions that can increase the risk of getting a yeast infection of the skin include: °· Obesity. °· Pregnancy. °· Diabetes. °· Taking antibiotic medicine. °· Taking birth control pills. °· Taking steroid medicines. °· Thyroid disease. °· An iron or zinc deficiency. °· Problems with the immune system. °SYMPTOMS  °· Red, swollen area of the skin. °· Bumps on the skin. °· Itchiness. °DIAGNOSIS  °The diagnosis of cutaneous candidiasis is usually based on its appearance. Light scrapings of the skin may also be taken and viewed under a microscope to identify the presence of yeast. °TREATMENT  °Antifungal creams may be applied to the infected skin. In severe cases, oral medicines may be needed.  °HOME CARE INSTRUCTIONS  °· Keep your skin clean and dry. °· Maintain a healthy weight. °· If you have diabetes, keep your blood sugar under control. °SEEK IMMEDIATE MEDICAL CARE IF: °· Your rash continues to spread despite treatment. °· You have a fever, chills, or abdominal pain. °  °This information is not intended to replace advice given to you by your health care provider. Make sure you discuss any questions you have with your health care provider. °  °Document Released: 02/13/2011 Document Revised: 08/20/2011 Document Reviewed: 11/29/2014 °Elsevier Interactive Patient Education ©2016 [employer] ° °

## 2015-12-02 NOTE — Progress Notes (Signed)
Subjective:    Patient ID: [name] [name], female    DOB: Mar 19, 1933, 82 y.o.   MRN: [id number]  Pt presents to the office today for chronic follow up. Pt has pacemaker and is followed by her Cardiologist every 6 months. Diabetes She presents for her follow-up diabetic visit. She has type 2 diabetes mellitus. Her disease course has been fluctuating. Hypoglycemia symptoms include nervousness/anxiousness. Pertinent negatives for hypoglycemia include no confusion, dizziness, headaches or hunger. Associated symptoms include fatigue and foot paresthesias. Pertinent negatives for diabetes include no blurred vision and no visual change. Pertinent negatives for hypoglycemia complications include no blackouts and no hospitalization. Symptoms are worsening. Diabetic complications include peripheral neuropathy. Pertinent negatives for diabetic complications include no CVA, heart disease or nephropathy. Risk factors for coronary artery disease include diabetes mellitus, dyslipidemia, hypertension, obesity, post-menopausal and family history. Current diabetic treatment includes insulin injections and oral agent (dual therapy). She is following a generally unhealthy diet. Her breakfast blood glucose range is generally 110-130 mg/dl. An ACE inhibitor/angiotensin II receptor blocker is being taken. Eye exam is not current.  Hypertension This is a chronic problem. The current episode started more than 1 year ago. The problem has been resolved since onset. The problem is controlled. Associated symptoms include anxiety, peripheral edema and shortness of breath (At times). Pertinent negatives include no blurred vision, headaches or palpitations. Risk factors for coronary artery disease include diabetes mellitus, dyslipidemia, obesity and post-menopausal state. Past treatments include ACE inhibitors, beta blockers and calcium channel blockers. The current treatment provides significant improvement. Hypertensive end-organ damage  includes CAD/MI and a thyroid problem. There is no history of kidney disease, CVA or heart failure. There is no history of sleep apnea.  Hyperlipidemia This is a chronic problem. The current episode started more than 1 year ago. The problem is controlled. Recent lipid tests were reviewed and are normal. Exacerbating diseases include hypothyroidism and obesity. Associated symptoms include shortness of breath (At times). Pertinent negatives include no myalgias. Current antihyperlipidemic treatment includes statins. The current treatment provides significant improvement of lipids. Risk factors for coronary artery disease include diabetes mellitus, dyslipidemia, hypertension, obesity and post-menopausal.  Anxiety Presents for follow-up visit. Onset was more than 5 years ago. The problem has been waxing and waning. Symptoms include insomnia, nervous/anxious behavior and shortness of breath (At times). Patient reports no confusion, depressed mood, dizziness, excessive worry, irritability, nausea or palpitations. Symptoms occur most days. The symptoms are aggravated by family issues. The quality of sleep is fair. Nighttime awakenings: occasional.   Her past medical history is significant for anxiety/panic attacks and CAD. There is no history of depression. Past treatments include benzodiazephines.  Gastroesophageal Reflux She complains of a hoarse voice. She reports no belching, no coughing, no heartburn, no nausea or no sore throat. This is a chronic problem. The current episode started more than 1 year ago. The problem occurs rarely. The problem has been resolved. The symptoms are aggravated by certain foods and lying down. Associated symptoms include fatigue. Risk factors include obesity. She has tried a PPI for the symptoms. The treatment provided significant relief.  Thyroid Problem Presents for follow-up visit. Symptoms include anxiety, fatigue and hoarse voice. Patient reports no constipation, depressed  mood, diarrhea, palpitations or visual change. The symptoms have been stable. Past treatments include levothyroxine. The treatment provided significant relief. Her past medical history is significant for hyperlipidemia. There is no history of heart failure.  Rash This is a new problem. The current episode started 1 to  4 weeks ago. The problem is unchanged. The affected locations include the groin. The rash is characterized by redness and pain. She was exposed to nothing. Associated symptoms include fatigue and shortness of breath (At times). Pertinent negatives include no cough, diarrhea or sore throat. Treatments tried: powder. The treatment provided no relief.  OAB Pt currently taking myrrbetriq 25 daily. Pt states she continues to get up every hour to "pee'.  Peripheral Neuropathy Pt currently taking gabapentin 300 mg TID. PT states she continues to have numbness in bilateral feet.   Review of Systems  Constitutional: Positive for fatigue. Negative for irritability.  HENT: Positive for hoarse voice. Negative for sore throat.   Eyes: Negative.  Negative for blurred vision.  Respiratory: Positive for shortness of breath (At times). Negative for cough.   Cardiovascular: Negative.  Negative for palpitations.  Gastrointestinal: Negative.  Negative for heartburn, nausea, diarrhea and constipation.  Endocrine: Negative.   Genitourinary: Negative.   Musculoskeletal: Negative.  Negative for myalgias.  Skin: Positive for rash.  Neurological: Negative.  Negative for dizziness and headaches.  Hematological: Negative.   Psychiatric/Behavioral: Negative for confusion. The patient is nervous/anxious and has insomnia.   All other systems reviewed and are negative.      Objective:   Physical Exam  Constitutional: She is oriented to person, place, and time. She appears well-developed and well-nourished. No distress.  HENT:  Head: Normocephalic and atraumatic.  Eyes: Pupils are equal, round, and  reactive to light.  Neck: Normal range of motion. Neck supple. No thyromegaly present.  Cardiovascular: Normal rate, regular rhythm, normal heart sounds and intact distal pulses.   No murmur heard. Pulmonary/Chest: Effort normal and breath sounds normal. No respiratory distress. She has no wheezes.  Abdominal: Soft. Bowel sounds are normal. She exhibits no distension. There is no tenderness.  Musculoskeletal: She exhibits edema (3+ in BLE). She exhibits no tenderness.  Neurological: She is alert and oriented to person, place, and time. She has normal reflexes. No cranial nerve deficit.  Skin: Skin is warm and dry. Rash noted. There is erythema.  Generalized erythemas rash under bilateral breast and bilateral groin   Psychiatric: She has a normal mood and affect. Her behavior is normal. Judgment and thought content normal.  Vitals reviewed.  BP 158/73 mmHg  Pulse 67  Temp(Src) 97.4 F (36.3 C) (Oral)  Ht _0  (1.778 m)  Wt 234 lb (106.142 kg)  BMI 33.58 kg/m2     Assessment & Plan:  1. Essential hypertension - CMP14+EGFR  2. Chronic diastolic heart failure (HCC) - CMP14+EGFR  3. Chronic obstructive pulmonary disease, unspecified COPD type (HCC) - CMP14+EGFR  4. Vitamin B 12 deficiency - CMP14+EGFR  5. Gastroesophageal reflux disease, esophagitis presence not specified - CMP14+EGFR  6. Hypothyroidism, unspecified hypothyroidism type - CMP14+EGFR - Thyroid Panel With TSH  7. Type II diabetes mellitus with peripheral autonomic neuropathy (HCC) - CMP14+EGFR - Bayer DCA Hb A1c Waived - Microalbumin / creatinine urine ratio  8. Other polyneuropathy (HCC) - CMP14+EGFR - HYDROcodone-acetaminophen (NORCO/VICODIN) 5-325 MG tablet; TAKE (1) TABLET EVERY SIX HOURS AS NEEDED FOR MODERATE PAIN  Dispense: 60 tablet; Refill: 0 - HYDROcodone-acetaminophen (NORCO/VICODIN) 5-325 MG tablet; Take 1 tablet by mouth every 6 (six) hours as needed for moderate pain.  Dispense: 60 tablet;  Refill: 0 - HYDROcodone-acetaminophen (NORCO/VICODIN) 5-325 MG tablet; Take 1 tablet by mouth every 6 (six) hours as needed for moderate pain.  Dispense: 60 tablet; Refill: 0  9. Osteoarthrosis, unspecified whether generalized or localized,  involving lower leg - CMP14+EGFR - HYDROcodone-acetaminophen (NORCO/VICODIN) 5-325 MG tablet; TAKE (1) TABLET EVERY SIX HOURS AS NEEDED FOR MODERATE PAIN  Dispense: 60 tablet; Refill: 0 - HYDROcodone-acetaminophen (NORCO/VICODIN) 5-325 MG tablet; Take 1 tablet by mouth every 6 (six) hours as needed for moderate pain.  Dispense: 60 tablet; Refill: 0 - HYDROcodone-acetaminophen (NORCO/VICODIN) 5-325 MG tablet; Take 1 tablet by mouth every 6 (six) hours as needed for moderate pain.  Dispense: 60 tablet; Refill: 0  10. OAB (overactive bladder) - CMP14+EGFR  11. Hyperlipidemia - CMP14+EGFR - Lipid panel  12. Obesity - CMP14+EGFR  13. GAD (generalized anxiety disorder) - CMP14+EGFR  14. Insomnia - CMP14+EGFR  15. Skin yeast infection - CMP14+EGFR - nystatin (NYAMYC) powder; Apply topically 2 (two) times daily.  Dispense: 60 g; Refill: 0 - nystatin-triamcinolone (MYCOLOG II) cream; APPLY TO AFFECTED AREA 3 TIMES A DAY  Dispense: 60 g; Refill: 0   Continue all meds Labs pending Health Maintenance reviewed Diet and exercise encouraged RTO 3  months  Evelina Dun, FNP

## 2015-12-03 ENCOUNTER — Other Ambulatory Visit: Payer: Self-pay | Admitting: Family

## 2015-12-03 LAB — CMP14+EGFR
ALK PHOS: 69 IU/L (ref 39–117)
ALT: 20 IU/L (ref 0–32)
AST: 22 IU/L (ref 0–40)
Albumin/Globulin Ratio: 1.4 (ref 1.2–2.2)
Albumin: 4.3 g/dL (ref 3.5–4.7)
BUN/Creatinine Ratio: 11 — ABNORMAL LOW (ref 12–28)
BUN: 10 mg/dL (ref 8–27)
Bilirubin Total: 0.4 mg/dL (ref 0.0–1.2)
CALCIUM: 10.1 mg/dL (ref 8.7–10.3)
CO2: 24 mmol/L (ref 18–29)
CREATININE: 0.87 mg/dL (ref 0.57–1.00)
Chloride: 94 mmol/L — ABNORMAL LOW (ref 96–106)
GFR calc Af Amer: 72 mL/min/{1.73_m2} (ref >59)
GFR, EST NON AFRICAN AMERICAN: 63 mL/min/{1.73_m2} (ref >59)
GLOBULIN, TOTAL: 3.1 g/dL (ref 1.5–4.5)
GLUCOSE: 78 mg/dL (ref 65–99)
Potassium: 4.2 mmol/L (ref 3.5–5.2)
SODIUM: 136 mmol/L (ref 134–144)
Total Protein: 7.4 g/dL (ref 6.0–8.5)

## 2015-12-03 LAB — LIPID PANEL
CHOL/HDL RATIO: 2.9 ratio (ref 0.0–4.4)
CHOLESTEROL TOTAL: 156 mg/dL (ref 100–199)
HDL: 53 mg/dL (ref >39)
LDL CALC: 81 mg/dL (ref 0–99)
TRIGLYCERIDES: 111 mg/dL (ref 0–149)
VLDL CHOLESTEROL CAL: 22 mg/dL (ref 5–40)

## 2015-12-03 LAB — THYROID PANEL WITH TSH
FREE THYROXINE INDEX: 3.5 (ref 1.2–4.9)
T3 Uptake Ratio: 31 % (ref 24–39)
T4, Total: 11.2 ug/dL (ref 4.5–12.0)
TSH: 1.97 u[IU]/mL (ref 0.450–4.500)

## 2015-12-03 LAB — MICROALBUMIN / CREATININE URINE RATIO
CREATININE, UR: 56.9 mg/dL
MICROALB/CREAT RATIO: 120.2 mg/g{creat} — AB (ref 0.0–30.0)
MICROALBUM., U, RANDOM: 68.4 ug/mL

## 2015-12-03 MED ORDER — LISINOPRIL 10 MG PO TABS: 10.0000 mg | ORAL_TABLET | Freq: Every day | ORAL | Status: DC

## 2015-12-10 ENCOUNTER — Other Ambulatory Visit: Payer: Self-pay | Admitting: Family

## 2015-12-12 ENCOUNTER — Ambulatory Visit: Payer: Medicare Other | Admitting: Pharmacist

## 2015-12-12 ENCOUNTER — Other Ambulatory Visit: Payer: Self-pay | Admitting: Family

## 2015-12-12 NOTE — Telephone Encounter (Signed)
rx called into pharmacy

## 2015-12-12 NOTE — Telephone Encounter (Signed)
Last filled 11/14/15, last seen 12/02/15. Route to pool[facility]

## 2015-12-14 ENCOUNTER — Ambulatory Visit (INDEPENDENT_AMBULATORY_CARE_PROVIDER_SITE_OTHER): Payer: Medicare Other | Admitting: Pharmacist

## 2015-12-14 ENCOUNTER — Encounter: Payer: Self-pay | Admitting: Pharmacist

## 2015-12-14 VITALS — BP 130/62 | HR 70 | Ht 70.0 in | Wt 233.0 lb

## 2015-12-14 DIAGNOSIS — Z0000 Encounter for general adult medical examination without abnormal findings: Secondary | ICD-10-CM

## 2015-12-14 MED ORDER — SERTRALINE HCL 25 MG PO TABS: 25.0000 mg | ORAL_TABLET | Freq: Every day | ORAL | Status: DC

## 2015-12-14 MED ORDER — AMLODIPINE BESY-BENAZEPRIL HCL 10-40 MG PO CAPS: 1.0000 | ORAL_CAPSULE | Freq: Every day | ORAL | Status: DC

## 2015-12-14 NOTE — Progress Notes (Signed)
Patient ID: [name] [name], female   DOB: [birth year], 82 y.o.   MRN: [id number]    Subjective:   [name] [name] is a 82 y.o. female who presents for a subsequent Medicare Annual Wellness Visit.  Review of Systems  Review of Systems  Constitutional: Negative.   HENT: Negative.   Eyes: Positive for blurred vision.  Respiratory: Negative.   Cardiovascular: Negative.   Gastrointestinal: Negative.   Genitourinary: Positive for urgency.  Musculoskeletal: Positive for joint pain.  Skin: Positive for rash (continued rash under skin folds of abdomen - improving).  Neurological: Negative.   Endo/Heme/Allergies: Negative.   Psychiatric/Behavioral: Positive for depression. The patient is nervous/anxious.      Current Medications (verified) Outpatient Encounter Prescriptions as of 12/14/2015  Medication Sig  . ALPRAZolam (XANAX) 0.5 MG tablet TAKE 1 TABLET TWICE DAILY AS NEEDED FOR ANXIETY  . amLODipine-benazepril (LOTREL) 10-20 MG capsule TAKE (1) CAPSULE DAILY  . ASSURE COMFORT LANCETS 30G MISC   . Blood Glucose Calibration (ACCU-CHEK SMARTVIEW CONTROL) LIQD   . Blood Glucose Monitoring Suppl (ACCU-CHEK NANO SMARTVIEW) W/DEVICE KIT USE TWICE DAILY  . cyclobenzaprine (FLEXERIL) 10 MG tablet Take 1 tablet (10 mg total) by mouth 3 (three) times daily as needed f or muscle spasms.  Marland Kitchen gabapentin (NEURONTIN) 300 MG capsule TAKE (1) CAPSULE THREE TIMES DAILY.  Marland Kitchen glimepiride (AMARYL) 4 MG tablet TAKE (1) TABLET DAILY BE- FORE BREAKFAST.  Marland Kitchen glucose blood test strip Use TID and prn  . HYDROcodone-acetaminophen (NORCO/VICODIN) 5-325 MG tablet TAKE (1) TABLET EVERY SIX HOURS AS NEEDED FOR MODERATE PAIN  . HYDROcodone-acetaminophen (NORCO/VICODIN) 5-325 MG tablet Take 1 tablet by mouth every 6 (six) hours as needed for moderate pain.  Marland Kitchen HYDROcodone-acetaminophen (NORCO/VICODIN) 5-325 MG tablet Take 1 tablet by mouth every 6 (six) hours as needed for moderate pain.  Marland Kitchen levothyroxine (SYNTHROID, LEVOTHROID)  200 MCG tablet TAKE (1) TABLET DAILY BE- FORE BREAKFAST.  Marland Kitchen levothyroxine (SYNTHROID, LEVOTHROID) 50 MCG tablet TAKE 1 TABLET DAILY  . lisinopril (PRINIVIL,ZESTRIL) 10 MG tablet Take 1 tablet (10 mg total) by mouth daily.  . metFORMIN (GLUCOPHAGE) 1000 MG tablet TAKE (1) TABLET TWICE A DAY WITH MEALS (BREAKFAST AND SUPPER) FOR DIAB ETES  . metoprolol succinate (TOPROL-XL) 50 MG 24 hr tablet TAKE 1 TABLET ONCE DAILY AFTER MEAL  . mirabegron ER (MYRBETRIQ) 50 MG TB24 tablet Take 1 tablet (50 mg total) by mouth daily.  . Multiple Vitamins-Minerals (CENTRUM WOMEN PO) Take 1 tablet by mouth daily.  . nitroGLYCERIN (NITROLINGUAL) 0.4 MG/SPRAY spray Place 1 spray under the tongue every 5 (five) minutes as needed.  . nystatin (MYCOSTATIN/NYSTOP) powder APPLY TO AFFECTED AREAS TWICE A DAY  . nystatin-triamcinolone (MYCOLOG II) cream APPLY TO AFFECTED AREA 3 TIMES A DAY  . pravastatin (PRAVACHOL) 80 MG tablet Take 80 mg by mouth daily.  Marland Kitchen PROAIR HFA 108 (90 Base) MCG/ACT inhaler 2 PUFFS EVERY 6 HOURS AS NEEDED FOR WHEEZING   Vesicare 53m  Take 1 tablet daily  . SURE COMFORT INSULIN SYRINGE 31G X 5/16" 0.3 ML MISC   . aspirin EC 81 MG tablet Take 81 mg by mouth daily. Reported on 12/14/2015  . furosemide (LASIX) 40 MG tablet Take 40 mg by mouth daily as needed. Reported on 12/14/2015  . insulin NPH Human (HUMULIN N) 100 UNIT/ML injection Inject 10 Units into the skin 2 (two) times daily.   . SYMBICORT 80-4.5 MCG/ACT inhaler Inhale 2 puffs into the lungs 2 (two) times daily. (Patient not taking: Reported on  12/14/2015)  . [DISCONTINUED] fluconazole (DIFLUCAN) 150 MG tablet Take 1 tablet (150 mg total) by mouth once. (Patient not taking: Reported on 12/14/2015)  . [DISCONTINUED] omeprazole (PRILOSEC) 20 MG capsule TAKE (1) CAPSULE DAILY (Patient not taking: Reported on 12/14/2015)   No facility-administered encounter medications on file as of 12/14/2015.    Allergies (verified) Penicillins   History: Past Medical  History  Diagnosis Date  . Arteriosclerotic cardiovascular disease (ASCVD)     Cath in 9/99-70% mid LAD with diffuse distal disease, 70% T1, 60% mid circumflex, 50% mid RCA, normal ejection fraction; negative stress nuclear in 07/2008  . Sinoatrial node dysfunction ([place]) 2003    Medtronic dual-chamber device  . Cerebrovascular disease     carotid bruits; no focal disease in 1999, 2002 and 2006  . ANXIETY   . Diastolic CHF, chronic (HCC)     Normal EF  . COPD   . Hyperlipidemia   . Hypertension   . Hypothyroidism   . Gastroesophageal reflux disease     With hiatal hernia; irritable bowel syndrome; H. pylori-treated; Colonoscopy 2008: non-specific colitis, IH, Diverticulosis, Rectal ulcer secondary to ASA  . OSTEOARTHRITIS, KNEES, BILATERAL     Bilateral TKA  . VITAMIN B12 DEFICIENCY   . Carpal tunnel syndrome   . Peripheral neuropathy ([place])   . Diabetes mellitus     Insulin treatment  . Pacemaker   . Closed left fibular fracture 11/30/11  . Sleep apnea    Past Surgical History  Procedure Laterality Date  . Total knee arthroplasty      Bilateral  . Nasal fracture surgery    . Cholecystectomy    . Abdominal hysterectomy    . Hemorrhoid surgery    . Carpal tunnel release    . Thyroidectomy  1980    Goiter  . Bladder repair    . Cardiac pacemaker placement  2003    Medtronic, dual-chamber  . Colonoscopy  2008   Family History  Problem Relation Age of Onset  . COPD Father   . Heart attack Father   . Heart failure Mother   . Cancer Sister     colon  . Cancer Brother     lung  . Cancer Sister     bladder  . Cancer Brother     lung  . Early death Son   . Diabetes Son   . Heart disease Son   . Cancer Son     prostate  . Cancer Sister     kidney cancer  . Diabetes Sister    Social History   Occupational History  . retired    Social History Main Topics  . Smoking status: Never Smoker   . Smokeless tobacco: Never Used  . Alcohol Use: No  . Drug Use: No  .  Sexual Activity: Not Currently    Do you feel safe at home?  No Are there smokers in your home (other than you)? No  Dietary issues and exercise activities: Current Exercise Habits: The patient does not participate in regular exercise at present, Exercise limited by: orthopedic condition(s)  Current Dietary habits:  Mrs. Callicott tries to limit high sodium foods.  Uses frozen or fresh vegetables when able.  Limits intake of sugar and high CHO foods.  Objective:    Today's Vitals   12/14/15 1034  BP: 130/62  Pulse: 70  Height: '5\' 10"'  (1.778 m)  Weight: 233 lb (105.688 kg)  PainSc: 2    Body mass index is 33.43  kg/(m^2).  Activities of Daily Living In your present state of health, do you have any difficulty performing the following activities: 12/14/2015 12/02/2015  Hearing? N N  Vision? Y N  Difficulty concentrating or making decisions? N N  Walking or climbing stairs? N N  Dressing or bathing? N N  Doing errands, shopping? N N  Preparing Food and eating ? N -  Using the Toilet? N -  In the past six months, have you accidently leaked urine? Y -  Do you have problems with loss of bowel control? N -  Managing your Medications? N -  Managing your Finances? N -  Housekeeping or managing your Housekeeping? N -     Cardiac Risk Factors include: advanced age (>50mn, >>24women);diabetes mellitus;dyslipidemia;family history of premature cardiovascular disease;hypertension;obesity (BMI >30kg/m2);sedentary lifestyle  Depression Screen PHQ 2/9 Scores 12/14/2015 12/02/2015 07/05/2015 05/02/2015  PHQ - 2 Score 3 0 0 0  PHQ- 9 Score 5 - - -     Fall Risk Fall Risk  12/14/2015 12/02/2015 07/05/2015 05/02/2015 01/11/2015  Falls in the past year? No No No No No  Risk for fall due to : - - - - -  Risk for fall due to (comments): - - - - -    Cognitive Function: MMSE - Mini Mental State Exam 12/14/2015 08/20/2014  Orientation to time 5 5  Orientation to Place 5 5  Registration 3 3  Attention/  Calculation 0 1  Attention/Calculation-comments patient unable to complete due to decreased literacy -  Recall 3 3  Language- name 2 objects 2 2  Language- repeat 1 1  Language- follow 3 step command 3 3  Language- read & follow direction 1 0  Language-read & follow direction-comments - patient cannot read  Write a sentence 0 0  Write a sentence-comments pt unable to complete due to decreased literacy patient cannot read  Copy design 1 1  Total score 24 24    Immunizations and Health Maintenance Immunization History  Administered Date(s) Administered  . Influenza Split 03/13/2011  . Influenza Whole 03/11/2009, 02/28/2010  . Influenza,inj,Quad PF,36+ Mos 04/14/2013, 04/16/2014, 05/02/2015  . Influenza-Unspecified 03/19/2012  . Pneumococcal Conjugate-13 08/20/2014  . Pneumococcal Polysaccharide-23 03/19/2012  . Td 08/29/2009  . Zoster 01/11/2015   There are no preventive care reminders to display for this patient.  Patient Care Team: CSharion Balloon FNP as PCP - General (Nurse Practitioner) GEvans Lance MD as Consulting Physician (Cardiology) RInda Castle MD as Consulting Physician (Gastroenterology) MSherren Mocha MD as Consulting Physician (Cardiology)  Indicate any recent Medical Services you may have received from other than [facility] providers in the past year (date may be approximate).    Assessment:    Annual Wellness Visit  Medication management - patient is on 2 ACE-I (lisinopril and benazepril + amlodipine), she is taking 2 meds for OAB (Myrbetriq and Vesicare).  She is also using Proair every day and Symbicort on PRN basis Depression Yeast infection of skin folds - improving with current treatment    Screening Tests Health Maintenance  Topic Date Due  . OPHTHALMOLOGY EXAM  03/03/2016 (Originally 08/23/2015)  . INFLUENZA VACCINE  01/10/2016  . FOOT EXAM  05/01/2016  . HEMOGLOBIN A1C  06/02/2016  . DEXA SCAN  08/10/2016  . TETANUS/TDAP  08/30/2019  .  ZOSTAVAX  Completed  . PNA vac Low Risk Adult  Completed        Plan:   During the course of the visit Adiah was educated  and counseled about the following appropriate screening and preventive services:   Vaccines to include Pneumoccal, Influenza, Td, Zostavax- patient is UTD on vaccines  Colorectal cancer screening - Colonoscopy is UTD  Cardiovascular disease screening - last EKG was 08/2015  BP was at goal today, however patient is taking lisinopril and benzepril + amlodipine.  D/C lisinopril.  Increase Benazepril + Amlodipine to 40/33m take 1 capsule daily  Diabetes - Lat A1c was at goal.  Continue curren diabetes treatment  Bone Denisty / Osteoporosis Screening - UTD  Mammogram - not required after age 80 PAP - no longer requried  Glaucoma screening / Diabetic Eye Exam - Needed - appt made with Dr TRona Ravens Nutrition counseling - continue to limit salt and CHO intake.  Recommended smaller serving sizes to help reduce weight  Advanced Directives -  Pt declined information today   Physical Activity - try to be active every day - start with 5 to 10 minutes and increase as able to 30 minutes daily.  Gave Chair exercises to try  D/c vesicare, continue myrbetriq  Discussed depression with patient's PCP and agreed to start sertraline 26mtake 1 tablet daily.  Educated about the difference between rescue inhalers (Proair) and maintenance inhaler (Symbicort) - wrote on inhalers so she can differentiate between rescue and maintenance.     Reviewed all medications with patient, reason she is taking and how to take.  Discusses compliance with all medications.     Patient Instructions (the written plan) were given to the patient.   EcCherre RobinsPHColumbus Endoscopy Center LLC 12/15/2015

## 2015-12-14 NOTE — Patient Instructions (Signed)
  [name] , Thank you for taking time to come for your Medicare Wellness Visit. I appreciate your ongoing commitment to your health goals. Please review the following plan we discussed and let me know if I can assist you in the future.   These are the goals we discussed: Goals    . Reduce sodium intake     Limit added salt and high salt foods to help with swelling and blood pressure    . Reduce sugar and high carbohydrate intake     Limit serving sizes to 1/2 cup of potatoes,fruit, corn, peas and beans. Increase non starchy vegetables - greens beans, broccoli, cauliflower, carrots, greens, lettuce, cabbage, onions, pepper, asparagus and cucumbers.       This is a list of the screening recommended for you and due dates:  Health Maintenance  Topic Date Due  . Eye exam for diabetics  Due now - sending referral to my eye dr  . [name] [name]  01/10/2016  . Complete foot exam   05/01/2016  . Hemoglobin A1C  06/02/2016  . DEXA scan (bone density measurement)  08/10/2016  . Tetanus Vaccine  08/30/2019  . Shingles Vaccine  Completed  . Pneumonia vaccines  Completed  *Topic was postponed. The date shown is not the original due date.

## 2015-12-15 ENCOUNTER — Other Ambulatory Visit: Payer: Self-pay | Admitting: Pharmacist

## 2015-12-15 DIAGNOSIS — Z0100 Encounter for examination of eyes and vision without abnormal findings: Principal | ICD-10-CM

## 2015-12-15 DIAGNOSIS — Z794 Long term (current) use of insulin: Secondary | ICD-10-CM

## 2015-12-15 DIAGNOSIS — E119 Type 2 diabetes mellitus without complications: Secondary | ICD-10-CM

## 2015-12-31 ENCOUNTER — Other Ambulatory Visit: Payer: Self-pay | Admitting: Family

## 2016-01-04 ENCOUNTER — Other Ambulatory Visit: Payer: Self-pay | Admitting: Family

## 2016-01-12 ENCOUNTER — Other Ambulatory Visit: Payer: Self-pay | Admitting: Family

## 2016-01-24 ENCOUNTER — Telehealth: Payer: Self-pay | Admitting: Family

## 2016-01-24 ENCOUNTER — Other Ambulatory Visit: Payer: Self-pay | Admitting: *Deleted

## 2016-01-24 ENCOUNTER — Other Ambulatory Visit: Payer: Self-pay | Admitting: Family

## 2016-01-24 DIAGNOSIS — N3281 Overactive bladder: Secondary | ICD-10-CM

## 2016-01-24 NOTE — Telephone Encounter (Signed)
Patient should take levothyroxine 200 mcg.  She was ordered the levothyroxine 50 mcg in the past because of abnormal lab results.  Patient admitted she had not been taking her medication daily and therefore lab results were due to non compliance.  Myrbetriq was also discontinued.

## 2016-01-27 ENCOUNTER — Ambulatory Visit (INDEPENDENT_AMBULATORY_CARE_PROVIDER_SITE_OTHER): Payer: Medicare Other | Admitting: Family

## 2016-01-27 ENCOUNTER — Encounter: Payer: Self-pay | Admitting: Family

## 2016-01-27 ENCOUNTER — Other Ambulatory Visit: Payer: Self-pay | Admitting: Family

## 2016-01-27 ENCOUNTER — Ambulatory Visit (INDEPENDENT_AMBULATORY_CARE_PROVIDER_SITE_OTHER): Payer: Medicare Other

## 2016-01-27 VITALS — BP 133/67 | HR 62 | Temp 97.3°F | Ht 70.0 in | Wt 232.0 lb

## 2016-01-27 DIAGNOSIS — M25561 Pain in right knee: Secondary | ICD-10-CM

## 2016-01-27 DIAGNOSIS — M1710 Unilateral primary osteoarthritis, unspecified knee: Secondary | ICD-10-CM

## 2016-01-27 DIAGNOSIS — M179 Osteoarthritis of knee, unspecified: Secondary | ICD-10-CM | POA: Diagnosis not present

## 2016-01-27 DIAGNOSIS — IMO0002 Reserved for concepts with insufficient information to code with codable children: Secondary | ICD-10-CM

## 2016-01-27 DIAGNOSIS — G6289 Other specified polyneuropathies: Secondary | ICD-10-CM | POA: Diagnosis not present

## 2016-01-27 DIAGNOSIS — I739 Peripheral vascular disease, unspecified: Secondary | ICD-10-CM

## 2016-01-27 MED ORDER — HYDROCODONE-ACETAMINOPHEN 5-325 MG PO TABS: ORAL_TABLET | ORAL | 0 refills | Status: DC

## 2016-01-27 NOTE — Patient Instructions (Signed)

## 2016-01-27 NOTE — Progress Notes (Signed)
   Subjective:    Patient ID: [name] [name], female    DOB: 12-16-1932, 82 y.o.   MRN: [id number]  Knee Pain   The incident occurred more than 1 week ago. There was no injury mechanism. The pain is present in the right knee. The quality of the pain is described as aching. The pain is at a severity of 8/10. The pain is severe. The pain has been constant since onset. Associated symptoms include muscle weakness. Pertinent negatives include no numbness or tingling. She reports no foreign bodies present. The symptoms are aggravated by movement and weight bearing. She has tried rest, NSAIDs and non-weight bearing for the symptoms. The treatment provided moderate relief.      Review of Systems  Respiratory: Negative.  Negative for shortness of breath.   Cardiovascular: Negative for chest pain.  Musculoskeletal: Positive for arthralgias, back pain, gait problem and joint swelling.  Neurological: Negative for tingling and numbness.  All other systems reviewed and are negative.      Objective:   Physical Exam  Constitutional: She is oriented to person, place, and time. She appears well-developed and well-nourished. No distress.  HENT:  Head: Normocephalic.  Eyes: Pupils are equal, round, and reactive to light.  Neck: Normal range of motion. Neck supple. No thyromegaly present.  Cardiovascular: Normal rate, regular rhythm, normal heart sounds and intact distal pulses.   No murmur heard. Pulmonary/Chest: Effort normal and breath sounds normal. No respiratory distress. She has no wheezes.  Abdominal: Soft. Bowel sounds are normal. She exhibits no distension. There is no tenderness.  Musculoskeletal: Normal range of motion. She exhibits edema (2+ in right knee) and tenderness (with flexion or extension).  Neurological: She is alert and oriented to person, place, and time.  Skin: Skin is warm and dry.  Psychiatric: She has a normal mood and affect. Her behavior is normal. Judgment and thought  content normal.  Vitals reviewed.     BP 133/67   Pulse 62   Temp 97.3 F (36.3 C) (Oral)   Ht 5\' 10"  (1.778 m)   Wt 232 lb (105.2 kg)   BMI 33.29 kg/m      Assessment & Plan:  1. Osteoarthrosis, unspecified whether generalized or localized, involving lower leg - HYDROcodone-acetaminophen (NORCO/VICODIN) 5-325 MG tablet; TAKE (1) TABLET EVERY SIX HOURS AS NEEDED FOR MODERATE PAIN  Dispense: 60 tablet; Refill: 0 - Ambulatory referral to Orthopedic Surgery - DG Knee 1-2 Views Right; Future  2. Other polyneuropathy (HCC) - HYDROcodone-acetaminophen (NORCO/VICODIN) 5-325 MG tablet; TAKE (1) TABLET EVERY SIX HOURS AS NEEDED FOR MODERATE PAIN  Dispense: 60 tablet; Refill: 0 - Ambulatory referral to Orthopedic Surgery  3. Right knee pain - HYDROcodone-acetaminophen (NORCO/VICODIN) 5-325 MG tablet; TAKE (1) TABLET EVERY SIX HOURS AS NEEDED FOR MODERATE PAIN  Dispense: 60 tablet; Refill: 0 - Ambulatory referral to Orthopedic Surgery - DG Knee 1-2 Views Right; Future  Rest Ice Referral to Ortho Pain medication as needed RTO prn and keep chronic follow up  Jannifer Rodneyhristy Hawks, FNP

## 2016-01-31 ENCOUNTER — Telehealth: Payer: Self-pay | Admitting: Family

## 2016-01-31 NOTE — Telephone Encounter (Signed)
Left message on VM that either our office or Dr. Jeannetta EllisGioffre's office will give her a call with her appointment.

## 2016-02-08 ENCOUNTER — Other Ambulatory Visit: Payer: Self-pay | Admitting: Family

## 2016-02-09 ENCOUNTER — Telehealth: Payer: Self-pay | Admitting: Family

## 2016-02-09 MED ORDER — SULFAMETHOXAZOLE-TRIMETHOPRIM 800-160 MG PO TABS: 1.0000 | ORAL_TABLET | Freq: Two times a day (BID) | ORAL | 0 refills | Status: DC

## 2016-02-09 NOTE — Telephone Encounter (Signed)
Bactrim Prescription sent to pharmacy. Pt needs to follow up with Ortho about her knee

## 2016-02-09 NOTE — Telephone Encounter (Signed)
Aware of new script. 

## 2016-02-09 NOTE — Telephone Encounter (Signed)
Did patient follow up with ortho? Pt needs to follow up!

## 2016-02-09 NOTE — Telephone Encounter (Signed)
Pt is having increased swelling in legs and also sxs of UTI Wants RX for both States she can not come in for appt Please advise

## 2016-02-10 ENCOUNTER — Other Ambulatory Visit: Payer: Self-pay

## 2016-02-10 MED ORDER — FUROSEMIDE 40 MG PO TABS: 40.0000 mg | ORAL_TABLET | Freq: Every day | ORAL | 4 refills | Status: DC | PRN

## 2016-03-05 ENCOUNTER — Encounter: Payer: Self-pay | Admitting: *Deleted

## 2016-03-06 ENCOUNTER — Encounter: Payer: Self-pay | Admitting: Family

## 2016-03-06 ENCOUNTER — Ambulatory Visit (INDEPENDENT_AMBULATORY_CARE_PROVIDER_SITE_OTHER): Payer: Medicare Other | Admitting: Family

## 2016-03-06 VITALS — BP 144/64 | HR 60 | Temp 96.9°F | Ht 70.0 in | Wt 229.2 lb

## 2016-03-06 DIAGNOSIS — E039 Hypothyroidism, unspecified: Secondary | ICD-10-CM | POA: Diagnosis not present

## 2016-03-06 DIAGNOSIS — G6289 Other specified polyneuropathies: Secondary | ICD-10-CM

## 2016-03-06 DIAGNOSIS — M179 Osteoarthritis of knee, unspecified: Secondary | ICD-10-CM | POA: Diagnosis not present

## 2016-03-06 DIAGNOSIS — G4700 Insomnia, unspecified: Secondary | ICD-10-CM

## 2016-03-06 DIAGNOSIS — I495 Sick sinus syndrome: Secondary | ICD-10-CM | POA: Diagnosis not present

## 2016-03-06 DIAGNOSIS — F411 Generalized anxiety disorder: Secondary | ICD-10-CM

## 2016-03-06 DIAGNOSIS — J449 Chronic obstructive pulmonary disease, unspecified: Secondary | ICD-10-CM | POA: Diagnosis not present

## 2016-03-06 DIAGNOSIS — I739 Peripheral vascular disease, unspecified: Secondary | ICD-10-CM | POA: Diagnosis not present

## 2016-03-06 DIAGNOSIS — I10 Essential (primary) hypertension: Secondary | ICD-10-CM | POA: Diagnosis not present

## 2016-03-06 DIAGNOSIS — Z794 Long term (current) use of insulin: Secondary | ICD-10-CM | POA: Diagnosis not present

## 2016-03-06 DIAGNOSIS — I5032 Chronic diastolic (congestive) heart failure: Secondary | ICD-10-CM

## 2016-03-06 DIAGNOSIS — K219 Gastro-esophageal reflux disease without esophagitis: Secondary | ICD-10-CM | POA: Diagnosis not present

## 2016-03-06 DIAGNOSIS — IMO0002 Reserved for concepts with insufficient information to code with codable children: Secondary | ICD-10-CM

## 2016-03-06 DIAGNOSIS — E1143 Type 2 diabetes mellitus with diabetic autonomic (poly)neuropathy: Secondary | ICD-10-CM | POA: Diagnosis not present

## 2016-03-06 DIAGNOSIS — Z23 Encounter for immunization: Secondary | ICD-10-CM

## 2016-03-06 DIAGNOSIS — N3281 Overactive bladder: Secondary | ICD-10-CM | POA: Diagnosis not present

## 2016-03-06 DIAGNOSIS — E785 Hyperlipidemia, unspecified: Secondary | ICD-10-CM

## 2016-03-06 DIAGNOSIS — M25561 Pain in right knee: Secondary | ICD-10-CM

## 2016-03-06 DIAGNOSIS — E1165 Type 2 diabetes mellitus with hyperglycemia: Secondary | ICD-10-CM | POA: Diagnosis not present

## 2016-03-06 DIAGNOSIS — M1710 Unilateral primary osteoarthritis, unspecified knee: Secondary | ICD-10-CM

## 2016-03-06 LAB — BAYER DCA HB A1C WAIVED: HB A1C: 6.5 % (ref <7.0)

## 2016-03-06 MED ORDER — SERTRALINE HCL 50 MG PO TABS: 50.0000 mg | ORAL_TABLET | Freq: Every day | ORAL | 5 refills | Status: DC

## 2016-03-06 MED ORDER — HYDROCODONE-ACETAMINOPHEN 5-325 MG PO TABS: 1.0000 | ORAL_TABLET | Freq: Four times a day (QID) | ORAL | 0 refills | Status: DC | PRN

## 2016-03-06 MED ORDER — HYDROCODONE-ACETAMINOPHEN 5-325 MG PO TABS: ORAL_TABLET | ORAL | 0 refills | Status: DC

## 2016-03-06 MED ORDER — BUDESONIDE-FORMOTEROL FUMARATE 80-4.5 MCG/ACT IN AERO: INHALATION_SPRAY | RESPIRATORY_TRACT | 3 refills | Status: DC

## 2016-03-06 MED ORDER — ALPRAZOLAM 0.5 MG PO TABS: ORAL_TABLET | ORAL | 2 refills | Status: DC

## 2016-03-06 NOTE — Progress Notes (Signed)
Subjective:    Patient ID: [name] [name], female    DOB: [birth year], 82 y.o.   MRN: [id number]  Pt presents to the office today for chronic follow up. Pt has pacemaker for sinoatrial node dysfunction, CHF, PVD,  and is followed by her Cardiologist every 6 months. Pt's husband is currently in a nursing home, because of multiple falls. Pt states she feels sad and does not like living alone.  Diabetes  She presents for her follow-up diabetic visit. She has type 2 diabetes mellitus. Her disease course has been fluctuating. Hypoglycemia symptoms include nervousness/anxiousness. Pertinent negatives for hypoglycemia include no confusion, dizziness, headaches or hunger. Associated symptoms include foot paresthesias. Pertinent negatives for diabetes include no blurred vision and no visual change. Pertinent negatives for hypoglycemia complications include no blackouts and no hospitalization. Symptoms are worsening. Diabetic complications include peripheral neuropathy. Pertinent negatives for diabetic complications include no CVA, heart disease or nephropathy. Risk factors for coronary artery disease include diabetes mellitus, dyslipidemia, hypertension, obesity, post-menopausal and family history. Current diabetic treatment includes insulin injections and oral agent (dual therapy). She is following a generally unhealthy diet. Her breakfast blood glucose range is generally 130-140 mg/dl. An ACE inhibitor/angiotensin II receptor blocker is being taken. Eye exam is not current.  Hypertension  This is a chronic problem. The current episode started more than 1 year ago. The problem has been waxing and waning since onset. The problem is uncontrolled. Associated symptoms include anxiety and peripheral edema. Pertinent negatives include no blurred vision, headaches or palpitations. Risk factors for coronary artery disease include diabetes mellitus, dyslipidemia, obesity and post-menopausal state. Past treatments include ACE  inhibitors, beta blockers and calcium channel blockers. The current treatment provides significant improvement. Hypertensive end-organ damage includes CAD/MI and a thyroid problem. There is no history of kidney disease, CVA or heart failure. There is no history of sleep apnea.  Hyperlipidemia  This is a chronic problem. The current episode started more than 1 year ago. The problem is controlled. Recent lipid tests were reviewed and are normal. Exacerbating diseases include hypothyroidism and obesity. Pertinent negatives include no myalgias. Current antihyperlipidemic treatment includes statins. The current treatment provides significant improvement of lipids. Risk factors for coronary artery disease include diabetes mellitus, dyslipidemia, hypertension, obesity and post-menopausal.  Anxiety  Presents for follow-up visit. Onset was more than 5 years ago. The problem has been waxing and waning. Symptoms include depressed mood, excessive worry, insomnia and nervous/anxious behavior. Patient reports no confusion, dizziness, dry mouth, irritability, nausea or palpitations. Symptoms occur most days. The symptoms are aggravated by family issues. The quality of sleep is fair. Nighttime awakenings: occasional.   Her past medical history is significant for anxiety/panic attacks and CAD. There is no history of depression. Past treatments include benzodiazephines.  Gastroesophageal Reflux  She complains of a hoarse voice. She reports no belching, no heartburn or no nausea. This is a chronic problem. The current episode started more than 1 year ago. The problem occurs rarely. The problem has been resolved. The symptoms are aggravated by certain foods and lying down. Risk factors include obesity. She has tried a PPI for the symptoms. The treatment provided significant relief.  Thyroid Problem  Presents for follow-up visit. Symptoms include anxiety, depressed mood and hoarse voice. Patient reports no constipation,  palpitations or visual change. The symptoms have been stable. Past treatments include levothyroxine. The treatment provided significant relief. Her past medical history is significant for hyperlipidemia. There is no history of heart failure.  Arthritis  Presents for follow-up visit. She complains of stiffness and joint swelling. Affected locations include the right knee and left knee. Her pain is at a severity of 7/10. Pertinent negatives include no dry eyes or dry mouth.  OAB Pt currently taking myrrbetriq 25 daily. Pt states she continues to get up every hour to "pee'.  Peripheral Neuropathy Pt currently taking gabapentin 300 mg TID. PT states she continues to have numbness in bilateral feet.  COPD Pt currently taking albuterol daily, but is not taking her Symbicort. Pt states she did not know she was suppose to start that.   Review of Systems  Constitutional: Negative for irritability.  HENT: Positive for hoarse voice.   Eyes: Negative.  Negative for blurred vision.  Cardiovascular: Negative.  Negative for palpitations.  Gastrointestinal: Negative.  Negative for constipation, heartburn and nausea.  Endocrine: Negative.   Genitourinary: Negative.   Musculoskeletal: Positive for arthralgias, arthritis, joint swelling and stiffness. Negative for myalgias.  Neurological: Negative.  Negative for dizziness and headaches.  Hematological: Negative.   Psychiatric/Behavioral: Negative for confusion. The patient is nervous/anxious and has insomnia.   All other systems reviewed and are negative.      Objective:   Physical Exam  Constitutional: She is oriented to person, place, and time. She appears well-developed and well-nourished. No distress.  HENT:  Head: Normocephalic and atraumatic.  Eyes: Pupils are equal, round, and reactive to light.  Neck: Normal range of motion. Neck supple. No thyromegaly present.  Cardiovascular: Normal rate, regular rhythm, normal heart sounds and intact distal  pulses.   No murmur heard. Pulmonary/Chest: Effort normal and breath sounds normal. No respiratory distress. She has no wheezes.  Abdominal: Soft. Bowel sounds are normal. She exhibits no distension. There is no tenderness.  Musculoskeletal: She exhibits edema (2+ in BL feet and ankles). She exhibits no tenderness.  Neurological: She is alert and oriented to person, place, and time. She has normal reflexes. No cranial nerve deficit.  Skin: Skin is warm and dry. Ecchymosis noted. No rash noted. No erythema.  Generalized ecchymosis on bilateral legs and arms.    Psychiatric: She has a normal mood and affect. Her behavior is normal. Judgment and thought content normal.  Vitals reviewed.  BP (!) 144/72   Pulse 73   Temp (!) 96.9 F (36.1 C) (Oral)   Ht '5\' 10"'  (1.778 m)   Wt 229 lb 3.2 oz (104 kg)   BMI 32.89 kg/m      Assessment & Plan:  1. Essential hypertension - CMP14+EGFR  2. PVD (peripheral vascular disease) ([place]) - CMP14+EGFR  3. Chronic obstructive pulmonary disease, unspecified COPD type ([place]) -Symbicort restarted on patient, discussed taking everyday - CMP14+EGFR - budesonide-formoterol (SYMBICORT) 80-4.5 MCG/ACT inhaler; Inhale 2 puffs into the lungs 2 (two) times daily.  Dispense: 10.2 g; Refill: 3  4. Gastroesophageal reflux disease, esophagitis presence not specified - CMP14+EGFR  5. Type 2 diabetes mellitus with hyperglycemia, with long-term current use of insulin (HCC) - Bayer DCA Hb A1c Waived - CMP14+EGFR  6. Hypothyroidism, unspecified hypothyroidism type - CMP14+EGFR - Thyroid Panel With TSH  7. Type II diabetes mellitus with peripheral autonomic neuropathy (HCC) - CMP14+EGFR  8. Other polyneuropathy (HCC)  - CMP14+EGFR - HYDROcodone-acetaminophen (NORCO/VICODIN) 5-325 MG tablet; Take 1 tablet by mouth every 6 (six) hours as needed for moderate pain.  Dispense: 60 tablet; Refill: 0 - HYDROcodone-acetaminophen (NORCO/VICODIN) 5-325 MG tablet; Take 1  tablet by mouth every 6 (six) hours as needed for moderate pain.  Dispense:  60 tablet; Refill: 0 - HYDROcodone-acetaminophen (NORCO/VICODIN) 5-325 MG tablet; TAKE (1) TABLET EVERY SIX HOURS AS NEEDED FOR MODERATE PAIN  Dispense: 60 tablet; Refill: 0  9. Osteoarthrosis, unspecified whether generalized or localized, involving lower leg - CMP14+EGFR - HYDROcodone-acetaminophen (NORCO/VICODIN) 5-325 MG tablet; Take 1 tablet by mouth every 6 (six) hours as needed for moderate pain.  Dispense: 60 tablet; Refill: 0 - HYDROcodone-acetaminophen (NORCO/VICODIN) 5-325 MG tablet; Take 1 tablet by mouth every 6 (six) hours as needed for moderate pain.  Dispense: 60 tablet; Refill: 0 - HYDROcodone-acetaminophen (NORCO/VICODIN) 5-325 MG tablet; TAKE (1) TABLET EVERY SIX HOURS AS NEEDED FOR MODERATE PAIN  Dispense: 60 tablet; Refill: 0 - Ambulatory referral to Orthopedic Surgery  10. Chronic diastolic heart failure (HCC) - CMP14+EGFR  11. Sinoatrial node dysfunction (HCC) - CMP14+EGFR  12. OAB (overactive bladder) - CMP14+EGFR  13. GAD (generalized anxiety disorder) -Zoloft increased to 50 mg from 25 mg - CMP14+EGFR - ALPRAZolam (XANAX) 0.5 MG tablet; TAKE 1 TABLET TWICE DAILY AS NEEDED FOR ANXIETY  Dispense: 60 tablet; Refill: 2 - sertraline (ZOLOFT) 50 MG tablet; Take 1 tablet (50 mg total) by mouth daily.  Dispense: 30 tablet; Refill: 5  14. Hyperlipidemia - CMP14+EGFR - Lipid panel  15. Insomnia - CMP14+EGFR  16. Morbid obesity, unspecified obesity type ([place]) - CMP14+EGFR  17. Right knee pain - CMP14+EGFR - HYDROcodone-acetaminophen (NORCO/VICODIN) 5-325 MG tablet; TAKE (1) TABLET EVERY SIX HOURS AS NEEDED FOR MODERATE PAIN  Dispense: 60 tablet; Refill: 0 - Ambulatory referral to Orthopedic Surgery   Pt reviewed in [place] Controlled Substance Database- Pt has only received mediation from me.    Continue all meds Labs pending Health Maintenance reviewed Diet and exercise encouraged RTO  3 months   Evelina Dun, FNP

## 2016-03-06 NOTE — Patient Instructions (Signed)
Stress and Stress Management Stress is a normal reaction to life events. It is what you feel when life demands more than you are used to or more than you can handle. Some stress can be useful. For example, the stress reaction can help you catch the last bus of the day, study for a test, or meet a deadline at work. But stress that occurs too often or for too long can cause problems. It can affect your emotional health and interfere with relationships and normal daily activities. Too much stress can weaken your immune system and increase your risk for physical illness. If you already have a medical problem, stress can make it worse. CAUSES  All sorts of life events may cause stress. An event that causes stress for one person may not be stressful for another person. Major life events commonly cause stress. These may be positive or negative. Examples include losing your job, moving into a new home, getting married, having a baby, or losing a loved one. Less obvious life events may also cause stress, especially if they occur day after day or in combination. Examples include working long hours, driving in traffic, caring for children, being in debt, or being in a difficult relationship. SIGNS AND SYMPTOMS Stress may cause emotional symptoms including, the following:  Anxiety. This is feeling worried, afraid, on edge, overwhelmed, or out of control.  Anger. This is feeling irritated or impatient.  Depression. This is feeling sad, down, helpless, or guilty.  Difficulty focusing, remembering, or making decisions. Stress may cause physical symptoms, including the following:   Aches and pains. These may affect your head, neck, back, stomach, or other areas of your body.  Tight muscles or clenched jaw.  Low energy or trouble sleeping. Stress may cause unhealthy behaviors, including the following:   Eating to feel better (overeating) or skipping meals.  Sleeping too little, too much, or both.  Working  too much or putting off tasks (procrastination).  Smoking, drinking alcohol, or using drugs to feel better. DIAGNOSIS  Stress is diagnosed through an assessment by your health care provider. Your health care provider will ask questions about your symptoms and any stressful life events.Your health care provider will also ask about your medical history and may order blood tests or other tests. Certain medical conditions and medicine can cause physical symptoms similar to stress. Mental illness can cause emotional symptoms and unhealthy behaviors similar to stress. Your health care provider may refer you to a mental health professional for further evaluation.  TREATMENT  Stress management is the recommended treatment for stress.The goals of stress management are reducing stressful life events and coping with stress in healthy ways.  Techniques for reducing stressful life events include the following:  Stress identification. Self-monitor for stress and identify what causes stress for you. These skills may help you to avoid some stressful events.  Time management. Set your priorities, keep a calendar of events, and learn to say "no." These tools can help you avoid making too many commitments. Techniques for coping with stress include the following:  Rethinking the problem. Try to think realistically about stressful events rather than ignoring them or overreacting. Try to find the positives in a stressful situation rather than focusing on the negatives.  Exercise. Physical exercise can release both physical and emotional tension. The key is to find a form of exercise you enjoy and do it regularly.  Relaxation techniques. These relax the body and mind. Examples include yoga, meditation, tai chi, biofeedback, deep  breathing, progressive muscle relaxation, listening to music, being out in nature, journaling, and other hobbies. Again, the key is to find one or more that you enjoy and can do  regularly.  Healthy lifestyle. Eat a balanced diet, get plenty of sleep, and do not smoke. Avoid using alcohol or drugs to relax.  Strong support network. Spend time with family, friends, or other people you enjoy being around.Express your feelings and talk things over with someone you trust. Counseling or talktherapy with a mental health professional may be helpful if you are having difficulty managing stress on your own. Medicine is typically not recommended for the treatment of stress.Talk to your health care provider if you think you need medicine for symptoms of stress. HOME CARE INSTRUCTIONS  Keep all follow-up visits as directed by your health care provider.  Take all medicines as directed by your health care provider. SEEK MEDICAL CARE IF:  Your symptoms get worse or you start having new symptoms.  You feel overwhelmed by your problems and can no longer manage them on your own. SEEK IMMEDIATE MEDICAL CARE IF:  You feel like hurting yourself or someone else.   This information is not intended to replace advice given to you by your health care provider. Make sure you discuss any questions you have with your health care provider.   Document Released: 11/21/2000 Document Revised: 06/18/2014 Document Reviewed: 01/20/2013 Elsevier Interactive Patient Education 2016 [employer]  

## 2016-03-07 DIAGNOSIS — Z7984 Long term (current) use of oral hypoglycemic drugs: Secondary | ICD-10-CM | POA: Diagnosis not present

## 2016-03-07 DIAGNOSIS — E113293 Type 2 diabetes mellitus with mild nonproliferative diabetic retinopathy without macular edema, bilateral: Secondary | ICD-10-CM | POA: Diagnosis not present

## 2016-03-07 DIAGNOSIS — H25813 Combined forms of age-related cataract, bilateral: Secondary | ICD-10-CM | POA: Diagnosis not present

## 2016-03-07 DIAGNOSIS — Z794 Long term (current) use of insulin: Secondary | ICD-10-CM | POA: Diagnosis not present

## 2016-03-07 LAB — HM DIABETES EYE EXAM

## 2016-03-07 LAB — CMP14+EGFR
A/G RATIO: 1.6 (ref 1.2–2.2)
ALBUMIN: 4.1 g/dL (ref 3.5–4.7)
ALK PHOS: 75 IU/L (ref 39–117)
ALT: 23 IU/L (ref 0–32)
AST: 26 IU/L (ref 0–40)
BUN / CREAT RATIO: 16 (ref 12–28)
BUN: 12 mg/dL (ref 8–27)
Bilirubin Total: 0.4 mg/dL (ref 0.0–1.2)
CO2: 26 mmol/L (ref 18–29)
CREATININE: 0.75 mg/dL (ref 0.57–1.00)
Calcium: 9.8 mg/dL (ref 8.7–10.3)
Chloride: 100 mmol/L (ref 96–106)
GFR calc Af Amer: 86 mL/min/{1.73_m2} (ref >59)
GFR calc non Af Amer: 75 mL/min/{1.73_m2} (ref >59)
GLOBULIN, TOTAL: 2.6 g/dL (ref 1.5–4.5)
GLUCOSE: 72 mg/dL (ref 65–99)
Potassium: 4.6 mmol/L (ref 3.5–5.2)
Sodium: 140 mmol/L (ref 134–144)
TOTAL PROTEIN: 6.7 g/dL (ref 6.0–8.5)

## 2016-03-07 LAB — LIPID PANEL
CHOLESTEROL TOTAL: 129 mg/dL (ref 100–199)
Chol/HDL Ratio: 2.6 ratio units (ref 0.0–4.4)
HDL: 49 mg/dL (ref >39)
LDL CALC: 49 mg/dL (ref 0–99)
TRIGLYCERIDES: 157 mg/dL — AB (ref 0–149)
VLDL CHOLESTEROL CAL: 31 mg/dL (ref 5–40)

## 2016-03-07 LAB — THYROID PANEL WITH TSH
Free Thyroxine Index: 2.3 (ref 1.2–4.9)
T3 Uptake Ratio: 29 % (ref 24–39)
T4, Total: 7.8 ug/dL (ref 4.5–12.0)
TSH: 1.58 u[IU]/mL (ref 0.450–4.500)

## 2016-03-08 ENCOUNTER — Ambulatory Visit (INDEPENDENT_AMBULATORY_CARE_PROVIDER_SITE_OTHER): Payer: Medicare Other | Admitting: *Deleted

## 2016-03-08 DIAGNOSIS — I495 Sick sinus syndrome: Secondary | ICD-10-CM | POA: Diagnosis not present

## 2016-03-08 DIAGNOSIS — Z950 Presence of cardiac pacemaker: Secondary | ICD-10-CM

## 2016-03-08 NOTE — Patient Instructions (Signed)
Your physician wants you to follow-up in: March 2018 with Dr. Taylor. You will receive a reminder letter in the mail two months in advance. If you don't receive a letter, please call our office to schedule the follow-up appointment. 

## 2016-03-09 ENCOUNTER — Other Ambulatory Visit: Payer: Self-pay | Admitting: Family

## 2016-03-09 LAB — CUP PACEART INCLINIC DEVICE CHECK
Battery Remaining Longevity: 48 mo
Battery Voltage: 2.76 V
Brady Statistic AP VP Percent: 83 %
Brady Statistic AS VS Percent: 0 %
Implantable Lead Implant Date: 20030716
Implantable Lead Location: 753859
Implantable Lead Location: 753860
Lead Channel Impedance Value: 604 Ohm
Lead Channel Pacing Threshold Amplitude: 1 V
Lead Channel Pacing Threshold Pulse Width: 0.4 ms
Lead Channel Setting Pacing Amplitude: 2 V
Lead Channel Setting Pacing Pulse Width: 0.4 ms
Lead Channel Setting Sensing Sensitivity: 2.8 mV
MDC IDC LEAD IMPLANT DT: 20030716
MDC IDC MSMT BATTERY IMPEDANCE: 1075 Ohm
MDC IDC MSMT LEADCHNL RA IMPEDANCE VALUE: 489 Ohm
MDC IDC MSMT LEADCHNL RA PACING THRESHOLD AMPLITUDE: 0.5 V
MDC IDC MSMT LEADCHNL RA PACING THRESHOLD PULSEWIDTH: 0.4 ms
MDC IDC SESS DTM: 20170928145459
MDC IDC SET LEADCHNL RV PACING AMPLITUDE: 2.5 V
MDC IDC STAT BRADY AP VS PERCENT: 0 %
MDC IDC STAT BRADY AS VP PERCENT: 17 %

## 2016-03-09 NOTE — Progress Notes (Signed)
Pacemaker check in clinic. Normal device function. Thresholds, sensing, impedances consistent with previous measurements. Device programmed to maximize longevity. No mode switch or high ventricular rates noted. Device programmed at appropriate safety margins. Histogram distribution appropriate for patient activity level. Device programmed to optimize intrinsic conduction. Estimated longevity 4 years. Patient education completed. ROV with GT/R in 08/2016.

## 2016-03-09 NOTE — Progress Notes (Signed)
[facility] can not see this patient until she gets them her former records for her knee. Please call pt and let her know

## 2016-03-12 ENCOUNTER — Other Ambulatory Visit: Payer: Self-pay | Admitting: Family

## 2016-03-14 ENCOUNTER — Encounter (HOSPITAL_COMMUNITY): Payer: Self-pay | Admitting: *Deleted

## 2016-03-14 ENCOUNTER — Emergency Department (HOSPITAL_COMMUNITY): Payer: Medicare Other

## 2016-03-14 ENCOUNTER — Emergency Department (HOSPITAL_COMMUNITY)
Admission: EM | Admit: 2016-03-14 | Discharge: 2016-03-14 | Disposition: A | Discharge status: Home / Self Care | Payer: Medicare Other | Attending: Emergency Medicine | Admitting: Emergency Medicine

## 2016-03-14 DIAGNOSIS — R079 Chest pain, unspecified: Secondary | ICD-10-CM | POA: Diagnosis not present

## 2016-03-14 DIAGNOSIS — J449 Chronic obstructive pulmonary disease, unspecified: Secondary | ICD-10-CM | POA: Diagnosis not present

## 2016-03-14 DIAGNOSIS — E039 Hypothyroidism, unspecified: Secondary | ICD-10-CM | POA: Diagnosis not present

## 2016-03-14 DIAGNOSIS — Z79899 Other long term (current) drug therapy: Secondary | ICD-10-CM | POA: Insufficient documentation

## 2016-03-14 DIAGNOSIS — R112 Nausea with vomiting, unspecified: Secondary | ICD-10-CM | POA: Diagnosis not present

## 2016-03-14 DIAGNOSIS — R0600 Dyspnea, unspecified: Secondary | ICD-10-CM

## 2016-03-14 DIAGNOSIS — R002 Palpitations: Secondary | ICD-10-CM

## 2016-03-14 DIAGNOSIS — Z794 Long term (current) use of insulin: Secondary | ICD-10-CM | POA: Diagnosis not present

## 2016-03-14 DIAGNOSIS — R197 Diarrhea, unspecified: Secondary | ICD-10-CM

## 2016-03-14 DIAGNOSIS — E119 Type 2 diabetes mellitus without complications: Secondary | ICD-10-CM | POA: Diagnosis not present

## 2016-03-14 DIAGNOSIS — R531 Weakness: Secondary | ICD-10-CM | POA: Diagnosis not present

## 2016-03-14 DIAGNOSIS — R0609 Other forms of dyspnea: Secondary | ICD-10-CM

## 2016-03-14 DIAGNOSIS — I5032 Chronic diastolic (congestive) heart failure: Secondary | ICD-10-CM | POA: Diagnosis not present

## 2016-03-14 DIAGNOSIS — R0602 Shortness of breath: Secondary | ICD-10-CM | POA: Diagnosis not present

## 2016-03-14 DIAGNOSIS — R069 Unspecified abnormalities of breathing: Secondary | ICD-10-CM | POA: Insufficient documentation

## 2016-03-14 DIAGNOSIS — I110 Hypertensive heart disease with heart failure: Secondary | ICD-10-CM | POA: Insufficient documentation

## 2016-03-14 DIAGNOSIS — Z7982 Long term (current) use of aspirin: Secondary | ICD-10-CM | POA: Insufficient documentation

## 2016-03-14 DIAGNOSIS — I10 Essential (primary) hypertension: Secondary | ICD-10-CM | POA: Diagnosis not present

## 2016-03-14 LAB — CBC WITH DIFFERENTIAL/PLATELET
BASOS ABS: 0 10*3/uL (ref 0.0–0.1)
BASOS PCT: 0 %
Eosinophils Absolute: 0.2 10*3/uL (ref 0.0–0.7)
Eosinophils Relative: 3 %
HEMATOCRIT: 40.8 % (ref 36.0–46.0)
HEMOGLOBIN: 13.6 g/dL (ref 12.0–15.0)
Lymphocytes Relative: 31 %
Lymphs Abs: 2.1 10*3/uL (ref 0.7–4.0)
MCH: 30.1 pg (ref 26.0–34.0)
MCHC: 33.3 g/dL (ref 30.0–36.0)
MCV: 90.3 fL (ref 78.0–100.0)
MONO ABS: 0.4 10*3/uL (ref 0.1–1.0)
Monocytes Relative: 6 %
NEUTROS ABS: 4 10*3/uL (ref 1.7–7.7)
NEUTROS PCT: 60 %
Platelets: 270 10*3/uL (ref 150–400)
RBC: 4.52 MIL/uL (ref 3.87–5.11)
RDW: 12.4 % (ref 11.5–15.5)
WBC: 6.7 10*3/uL (ref 4.0–10.5)

## 2016-03-14 LAB — COMPREHENSIVE METABOLIC PANEL
ALK PHOS: 60 U/L (ref 38–126)
ALT: 23 U/L (ref 14–54)
ANION GAP: 7 (ref 5–15)
AST: 27 U/L (ref 15–41)
Albumin: 4.3 g/dL (ref 3.5–5.0)
BILIRUBIN TOTAL: 0.7 mg/dL (ref 0.3–1.2)
BUN: 8 mg/dL (ref 6–20)
CALCIUM: 9.2 mg/dL (ref 8.9–10.3)
CO2: 28 mmol/L (ref 22–32)
Chloride: 102 mmol/L (ref 101–111)
Creatinine, Ser: 0.66 mg/dL (ref 0.44–1.00)
Glucose, Bld: 143 mg/dL — ABNORMAL HIGH (ref 65–99)
POTASSIUM: 3.9 mmol/L (ref 3.5–5.1)
Sodium: 137 mmol/L (ref 135–145)
TOTAL PROTEIN: 7.7 g/dL (ref 6.5–8.1)

## 2016-03-14 LAB — TROPONIN I: Troponin I: <0.03 ng/mL (ref <0.03)

## 2016-03-14 LAB — I-STAT CG4 LACTIC ACID, ED: LACTIC ACID, VENOUS: 1.64 mmol/L (ref 0.5–1.9)

## 2016-03-14 MED ORDER — SODIUM CHLORIDE 0.9 % IV BOLUS (SEPSIS)
1000.0000 mL | Freq: Once | INTRAVENOUS | Status: AC
  Administered 2016-03-14: 1000 mL via INTRAVENOUS

## 2016-03-14 MED ORDER — SODIUM CHLORIDE 0.9 % IV BOLUS (SEPSIS)
500.0000 mL | Freq: Once | INTRAVENOUS | Status: AC
  Administered 2016-03-14: 500 mL via INTRAVENOUS

## 2016-03-14 MED ORDER — ONDANSETRON HCL 4 MG PO TABS: 4.0000 mg | ORAL_TABLET | Freq: Three times a day (TID) | ORAL | 0 refills | Status: DC | PRN

## 2016-03-14 MED ORDER — BENAZEPRIL HCL 10 MG PO TABS
40.0000 mg | ORAL_TABLET | Freq: Every day | ORAL | Status: DC
  Administered 2016-03-14: 40 mg via ORAL
  Filled 2016-03-14: qty 2
  Filled 2016-03-14 (×2): qty 4

## 2016-03-14 MED ORDER — ONDANSETRON HCL 4 MG/2ML IJ SOLN
4.0000 mg | Freq: Once | INTRAMUSCULAR | Status: AC
  Administered 2016-03-14: 4 mg via INTRAVENOUS
  Filled 2016-03-14: qty 2

## 2016-03-14 MED ORDER — AMLODIPINE BESYLATE 5 MG PO TABS
10.0000 mg | ORAL_TABLET | Freq: Once | ORAL | Status: AC
  Administered 2016-03-14: 10 mg via ORAL
  Filled 2016-03-14: qty 2

## 2016-03-14 MED ORDER — LOPERAMIDE HCL 2 MG PO CAPS
4.0000 mg | ORAL_CAPSULE | ORAL | Status: DC | PRN
  Administered 2016-03-14: 4 mg via ORAL
  Filled 2016-03-14: qty 2

## 2016-03-14 NOTE — ED Provider Notes (Signed)
Pt seen by Dr Devoria AlbeIva Milynn [name].  Plan is to see if pt can tolerate PO.  BP is elevated this am.  I have ordered her home meds.  Discussed plan with patient.  She would like to go home and states she will grab something to eat on the way home.  We will at least try some water and crackers here.   [name] DibblesJon Osric Klopf, MD 03/14/16 [phone]

## 2016-03-14 NOTE — ED Triage Notes (Signed)
Pt c/o headache, weakness and chest pain that radiates to left neck and head; pt c/o n/v and diarrhea

## 2016-03-14 NOTE — ED Provider Notes (Signed)
[facility] DEPT Provider Note   CSN: [id number] Arrival date & time: 03/14/16  0555  Time seen 06:07 AM   History   Chief Complaint Chief Complaint  Patient presents with  . Chest Pain    HPI [name] [name] is a 82 y.o. female.  HPI patient states she started getting vomiting and diarrhea about 3 days ago. She states she's vomiting 2-3 times a day. She is having more diarrhea than vomiting which she describes as "phlegm". She has some pain in her mid lower abdomen that she describes as cramping. She states the pain gets worse when she has diarrhea. She has had subjective fever. She states when she gets out of bed she feels like her heart is racing. She states she's been having pain in her left lateral chest that comes and goes and cannot tell me how long it lasts. She has had a cough that was initially yellow and now is white. She states she feels short of breath when she walks. She states yesterday she started getting weak and when she stands up her legs will hold her weight. She reports she got her flu shot approximately a week ago. When asked if she has been around anybody else is sick she states "everybody". She reports her siblings have had vomiting and diarrhea. She denies being on any antibiotics recently.  Patient states her ophthalmologist that she had "bleeding behind her eyes".  PCP NP [facility] in [place]   Past Medical History:  Diagnosis Date  . ANXIETY   . Arteriosclerotic cardiovascular disease (ASCVD)    Cath in 9/99-70% mid LAD with diffuse distal disease, 70% T1, 60% mid circumflex, 50% mid RCA, normal ejection fraction; negative stress nuclear in 07/2008  . Carpal tunnel syndrome   . Cerebrovascular disease    carotid bruits; no focal disease in 1999, 2002 and 2006  . Closed left fibular fracture 11/30/11  . COPD   . Diabetes mellitus    Insulin treatment  . Diastolic CHF, chronic (HCC)    Normal EF  . Gastroesophageal reflux disease    With hiatal hernia; irritable bowel syndrome; H. pylori-treated; Colonoscopy 2008: non-specific colitis, IH, Diverticulosis, Rectal ulcer secondary to ASA  . Hyperlipidemia   . Hypertension   . Hypothyroidism   . OSTEOARTHRITIS, KNEES, BILATERAL    Bilateral TKA  . Pacemaker   . Peripheral neuropathy ([place])   . Sinoatrial node dysfunction ([place]) 2003   Medtronic dual-chamber device  . Sleep apnea   . VITAMIN B12 DEFICIENCY     Patient Active Problem List   Diagnosis Date Noted  . PVD (peripheral vascular disease) ([place]) 01/27/2016  . Diabetes mellitus, type 2 ([place]) 07/19/2014  . Skin moniliasis 05/31/2014  . OAB (overactive bladder) 12/23/2012  . Insomnia 04/06/2011  . Sinoatrial node dysfunction (HCC)   . Gastroesophageal reflux disease   . Type II diabetes mellitus with peripheral autonomic neuropathy (HCC)   . GAD (generalized anxiety disorder) 12/30/2009  . Peripheral neuropathy ([place]) 12/26/2009  . Vitamin B 12 deficiency 10/11/2009  . Morbid obesity ([place]) 09/16/2009  . Arteriosclerotic cardiovascular disease (ASCVD) 09/15/2009  . CEREBROVASCULAR DISEASE 09/15/2009  . Hyperlipidemia 08/29/2009  . Hypertension 08/29/2009  . Osteoarthrosis, unspecified whether generalized or localized, involving lower leg 08/29/2009  . COLONIC POLYPS, ADENOMATOUS, HX OF 02/22/2009  . Hypothyroid 02/17/2009  . Chronic obstructive pulmonary disease ([place]) 02/17/2009  . Chronic diastolic heart failure ([place]) 12/15/2008  . PPM-Medtronic 12/15/2008    Past Surgical History:  Procedure Laterality Date  . ABDOMINAL HYSTERECTOMY    . BLADDER REPAIR    . CARDIAC PACEMAKER PLACEMENT  2003   Medtronic, dual-chamber  . CARPAL TUNNEL RELEASE    . CHOLECYSTECTOMY    . COLONOSCOPY  2008  . HEMORRHOID SURGERY    . NASAL FRACTURE SURGERY    . THYROIDECTOMY  1980   Goiter  . TOTAL KNEE ARTHROPLASTY     Bilateral    OB History    No data available       Home Medications    Prior to  Admission medications   Medication Sig Start Date End Date Taking? Authorizing Provider  ALPRAZolam Duanne Moron) 0.5 MG tablet TAKE 1 TABLET TWICE DAILY AS NEEDED FOR ANXIETY 03/06/16   Sharion Balloon, FNP  amLODipine-benazepril (LOTREL) 10-40 MG capsule Take 1 capsule by mouth daily. 12/14/15   Cherre Robins, PharmD  aspirin EC 81 MG tablet Take 81 mg by mouth daily. Reported on 12/14/2015    Historical Provider, MD  ASSURE COMFORT LANCETS 30G [place]  07/16/14   Historical Provider, MD  Blood Glucose Calibration (ACCU-CHEK SMARTVIEW CONTROL) LIQD  09/14/13   Historical Provider, MD  Blood Glucose Monitoring Suppl (ACCU-CHEK NANO SMARTVIEW) W/DEVICE KIT USE TWICE DAILY 02/10/14   Rowe Clack, MD  budesonide-formoterol (SYMBICORT) 80-4.5 MCG/ACT inhaler Inhale 2 puffs into the lungs 2 (two) times daily. 03/06/16   Sharion Balloon, FNP  cyclobenzaprine (FLEXERIL) 10 MG tablet Take 1 tablet (10 mg total) by mouth 3 (three) times daily as needed f or muscle spasms. 10/24/14   Sharion Balloon, FNP  furosemide (LASIX) 40 MG tablet Take 1 tablet (40 mg total) by mouth daily as needed. Reported on 12/14/2015 02/10/16   Sharion Balloon, FNP  gabapentin (NEURONTIN) 300 MG capsule TAKE (1) CAPSULE THREE TIMES DAILY. 12/12/15   Sharion Balloon, FNP  glimepiride (AMARYL) 4 MG tablet TAKE (1) TABLET DAILY BE- FORE BREAKFAST. 01/02/16   Sharion Balloon, FNP  glucose blood test strip Use TID and prn 05/23/15   Sharion Balloon, FNP  HUMULIN N 100 UNIT/ML injection INJECT 12 UNITS SQ 2 TIMES A DAY BEFORE A MEAL 01/05/16   Sharion Balloon, FNP  HYDROcodone-acetaminophen (NORCO/VICODIN) 5-325 MG tablet Take 1 tablet by mouth every 6 (six) hours as needed for moderate pain. 03/06/16   Sharion Balloon, FNP  HYDROcodone-acetaminophen (NORCO/VICODIN) 5-325 MG tablet Take 1 tablet by mouth every 6 (six) hours as needed for moderate pain. 03/06/16   Sharion Balloon, FNP  HYDROcodone-acetaminophen (NORCO/VICODIN) 5-325 MG tablet TAKE (1) TABLET  EVERY SIX HOURS AS NEEDED FOR MODERATE PAIN 03/06/16   Sharion Balloon, FNP  levothyroxine (SYNTHROID, LEVOTHROID) 200 MCG tablet TAKE (1) TABLET DAILY BE- FORE BREAKFAST. 11/18/15   Fransisca Kaufmann Dettinger, MD  metFORMIN (GLUCOPHAGE) 1000 MG tablet TAKE (1) TABLET TWICE A DAY WITH MEALS (BREAKFAST AND SUPPER) FOR DIAB ETES 01/12/16   Sharion Balloon, FNP  metoprolol succinate (TOPROL-XL) 50 MG 24 hr tablet TAKE 1 TABLET ONCE DAILY AFTER MEAL 03/12/16   Sharion Balloon, FNP  Multiple Vitamins-Minerals (CENTRUM WOMEN PO) Take 1 tablet by mouth daily.    Historical Provider, MD  nitroGLYCERIN (NITROLINGUAL) 0.4 MG/SPRAY spray Place 1 spray under the tongue every 5 (five) minutes as needed. 10/28/15   Claretta Fraise, MD  nystatin (MYCOSTATIN/NYSTOP) powder APPLY TO AFFECTED AREAS TWICE A DAY 01/24/16   Sharion Balloon, FNP  nystatin-triamcinolone (MYCOLOG II) cream APPLY TO AFFECTED AREA  3 TIMES A DAY 01/24/16   Sharion Balloon, FNP  pravastatin (PRAVACHOL) 80 MG tablet Take 80 mg by mouth daily.    Historical Provider, MD  PROAIR HFA 108 [phone] Base) MCG/ACT inhaler 2 PUFFS EVERY 6 HOURS AS NEEDED FOR WHEEZING 01/12/16   Sharion Balloon, FNP  sertraline (ZOLOFT) 50 MG tablet Take 1 tablet (50 mg total) by mouth daily. 03/06/16   Sharion Balloon, FNP  SURE COMFORT INSULIN SYRINGE 31G X 5/16" 0.3 ML MISC  07/16/14   Historical Provider, MD    Family History Family History  Problem Relation Age of Onset  . COPD Father   . Heart attack Father   . Heart failure Mother   . Cancer Sister     colon  . Cancer Brother     lung  . Cancer Sister     bladder  . Cancer Brother     lung  . Early death Son   . Diabetes Son   . Heart disease Son   . Cancer Son     prostate  . Cancer Sister     kidney cancer  . Diabetes Sister     Social History Social History  Substance Use Topics  . Smoking status: Never Smoker  . Smokeless tobacco: Never Used  . Alcohol use No  Lives at home Lives alone Husband is in a nursing  home   Allergies   Penicillins   Review of Systems Review of Systems  All other systems reviewed and are negative.    Physical Exam Updated Vital Signs BP (!) 151/107 (BP Location: Right Arm)   Pulse 62   Temp 98.1 F (36.7 C) (Oral)   Resp 13   Ht '5\' 10"'  (1.778 m)   Wt 237 lb (107.5 kg)   SpO2 95%   BMI 34.01 kg/m   Vital signs normal except for hypertension   Physical Exam  Constitutional: She is oriented to person, place, and time. She appears well-developed and well-nourished.  Non-toxic appearance. She does not appear ill. No distress.  HENT:  Head: Normocephalic and atraumatic.  Right Ear: External ear normal.  Left Ear: External ear normal.  Nose: Nose normal. No mucosal edema or rhinorrhea.  Mouth/Throat: Mucous membranes are normal. No dental abscesses or uvula swelling.  Tongue is dry  Eyes: Conjunctivae and EOM are normal. Pupils are equal, round, and reactive to light.  Neck: Normal range of motion and full passive range of motion without pain. Neck supple.  Cardiovascular: Normal rate, regular rhythm and normal heart sounds.  Exam reveals no gallop and no friction rub.   No murmur heard. Pulmonary/Chest: Effort normal and breath sounds normal. No respiratory distress. She has no wheezes. She has no rhonchi. She has no rales. She exhibits no tenderness and no crepitus.    Area of chest pain noted  Abdominal: Soft. Normal appearance and bowel sounds are normal. She exhibits no distension. There is no tenderness. There is no rebound and no guarding.    Musculoskeletal: Normal range of motion. She exhibits no edema or tenderness.  Moves all extremities well.   Neurological: She is alert and oriented to person, place, and time. She has normal strength. No cranial nerve deficit.  Skin: Skin is warm, dry and intact. No rash noted. No erythema. No pallor.  Psychiatric: She has a normal mood and affect. Her speech is normal and behavior is normal. Her mood  appears not anxious.  Nursing note and vitals reviewed.  ED Treatments / Results  Labs (all labs ordered are listed, but only abnormal results are displayed) Results for orders placed or performed during the hospital encounter of 03/14/16  Comprehensive metabolic panel  Result Value Ref Range   Sodium 137 135 - 145 mmol/L   Potassium 3.9 3.5 - 5.1 mmol/L   Chloride 102 101 - 111 mmol/L   CO2 28 22 - 32 mmol/L   Glucose, Bld 143 (H) 65 - 99 mg/dL   BUN 8 6 - 20 mg/dL   Creatinine, Ser 0.66 0.44 - 1.00 mg/dL   Calcium 9.2 8.9 - 10.3 mg/dL   Total Protein 7.7 6.5 - 8.1 g/dL   Albumin 4.3 3.5 - 5.0 g/dL   AST 27 15 - 41 U/L   ALT 23 14 - 54 U/L   Alkaline Phosphatase 60 38 - 126 U/L   Total Bilirubin 0.7 0.3 - 1.2 mg/dL   GFR calc non Af Amer >60 >60 mL/min   GFR calc Af Amer >60 >60 mL/min   Anion gap 7 5 - 15  CBC with Differential  Result Value Ref Range   WBC 6.7 4.0 - 10.5 K/uL   RBC 4.52 3.87 - 5.11 MIL/uL   Hemoglobin 13.6 12.0 - 15.0 g/dL   HCT 40.8 36.0 - 46.0 %   MCV 90.3 78.0 - 100.0 fL   MCH 30.1 26.0 - 34.0 pg   MCHC 33.3 30.0 - 36.0 g/dL   RDW 12.4 11.5 - 15.5 %   Platelets 270 150 - 400 K/uL   Neutrophils Relative % 60 %   Neutro Abs 4.0 1.7 - 7.7 K/uL   Lymphocytes Relative 31 %   Lymphs Abs 2.1 0.7 - 4.0 K/uL   Monocytes Relative 6 %   Monocytes Absolute 0.4 0.1 - 1.0 K/uL   Eosinophils Relative 3 %   Eosinophils Absolute 0.2 0.0 - 0.7 K/uL   Basophils Relative 0 %   Basophils Absolute 0.0 0.0 - 0.1 K/uL  Troponin I  Result Value Ref Range   Troponin I <0.03 <0.03 ng/mL  I-Stat CG4 Lactic Acid, ED  Result Value Ref Range   Lactic Acid, Venous 1.64 0.5 - 1.9 mmol/L   Laboratory interpretation all normal    EKG  EKG Interpretation  Date/Time:  Wednesday March 14 2016 16:10:96 EDT Ventricular Rate:  60 PR Interval:    QRS Duration: 194 QT Interval:  553 QTC Calculation: 553 R Axis:   -71 Text Interpretation:  Atrial-ventricular  dual-paced rhythm No further analysis attempted due to paced rhythm No significant change since last tracing 26 Dec 2009 Confirmed by Naje Rice  MD-I, Jeweldean Drohan ([id number]) on 03/14/2016 6:41:07 AM       Radiology Dg Chest Port 1 View  Result Date: 03/14/2016 CLINICAL DATA:  Headache, weakness, and chest pain radiating to the left neck. Nausea and vomiting. Diarrhea. Palpitations. EXAM: PORTABLE CHEST 1 VIEW COMPARISON:  06/22/2013 FINDINGS: Cardiac pacemaker. Shallow inspiration with linear atelectasis in the right lung base. Increased density in the right cardiophrenic angle probably representing a pericardial cyst or lipoma. No change since prior study. No focal consolidation or airspace disease in the lungs. No blunting of costophrenic angles. No pneumothorax. Degenerative changes in the shoulders. IMPRESSION: Shallow inspiration with linear atelectasis in the right lung base. Electronically Signed   By: Lucienne Capers M.D.   On: 03/14/2016 06:43    Procedures Procedures (including critical care time)  Medications Ordered in ED Medications  loperamide (IMODIUM) capsule 4 mg (4 mg  Oral Given 03/14/16 0715)  sodium chloride 0.9 % bolus 1,000 mL (1,000 mLs Intravenous New Bag/Given 03/14/16 8882)  sodium chloride 0.9 % bolus 500 mL (0 mLs Intravenous Stopped 03/14/16 0707)  ondansetron (ZOFRAN) injection 4 mg (4 mg Intravenous Given 03/14/16 0626)     Initial Impression / Assessment and Plan / ED Course  I have reviewed the triage vital signs and the nursing notes.  Pertinent labs & imaging results that were available during my care of the patient were reviewed by me and considered in my medical decision making (see chart for details).  Clinical Course   Patient was given IV fluids, she has had vomiting and diarrhea and weakness when she tries to stand all consistent with dehydration. She was given IV Zofran for her nausea. Laboratory testing was started.  Recheck at 07:05 AM pt states she isn't  feeling better, however her nausea is gone. States she could try to take oral diarrhea medication.  Discussed her CXR and lab tests that have resulted so far (CBC, LA). SIL concerned about her pacemaker. Discussed she just had it checked on the 29th and she has had an EKG and she is on the cardiac monitor and showed him where to look.   07:15 AM rest of her labs resulted. These were relayed to the patient. She states her abdominal pain is improving. Will sign out to Dr Hillard Danker at change of shift to make sure she continues to improve. She may need admission to obs because she lives alone.   07:52 AM Dr Jerilee Hoh, states she doesn't feel patient needs obs admission at this time.  Will turn over to Dr Hillard Danker to see how she does with her treatment.    Review of her records shows she had a pacemaker check done on September 28 which was normal.  Final Clinical Impressions(s) / ED Diagnoses   Final diagnoses:  Nausea vomiting and diarrhea  Weakness  Chest pain, unspecified type  Dyspnea on exertion  Palpitations    Disposition pending  Rolland Porter, MD, Barbette Or, MD 03/14/16 [phone]

## 2016-03-14 NOTE — ED Notes (Signed)
Pt tolerating po fluids and crackers.

## 2016-03-14 NOTE — ED Notes (Signed)
Pharmacy called for lotensin, not available in pyxis.

## 2016-03-19 ENCOUNTER — Encounter: Payer: Self-pay | Admitting: *Deleted

## 2016-03-19 NOTE — Progress Notes (Signed)
Patient plans to follow up with [facility] concerning her knee issues.  She does not want to bother with the referral to [facility] at this time.

## 2016-03-22 ENCOUNTER — Ambulatory Visit: Payer: Self-pay | Admitting: Pharmacist

## 2016-03-22 ENCOUNTER — Other Ambulatory Visit: Payer: Self-pay | Admitting: Family

## 2016-03-26 ENCOUNTER — Ambulatory Visit: Payer: Self-pay | Admitting: Pharmacist

## 2016-03-28 ENCOUNTER — Encounter: Payer: Self-pay | Admitting: Pharmacist

## 2016-03-28 ENCOUNTER — Ambulatory Visit (INDEPENDENT_AMBULATORY_CARE_PROVIDER_SITE_OTHER): Payer: Medicare Other | Admitting: Pharmacist

## 2016-03-28 VITALS — BP 128/64 | HR 60 | Ht 70.0 in | Wt 226.5 lb

## 2016-03-28 DIAGNOSIS — E1165 Type 2 diabetes mellitus with hyperglycemia: Secondary | ICD-10-CM

## 2016-03-28 DIAGNOSIS — E6609 Other obesity due to excess calories: Secondary | ICD-10-CM | POA: Diagnosis not present

## 2016-03-28 DIAGNOSIS — G4709 Other insomnia: Secondary | ICD-10-CM | POA: Diagnosis not present

## 2016-03-28 DIAGNOSIS — Z794 Long term (current) use of insulin: Secondary | ICD-10-CM

## 2016-03-28 DIAGNOSIS — I10 Essential (primary) hypertension: Secondary | ICD-10-CM

## 2016-03-28 MED ORDER — FUROSEMIDE 40 MG PO TABS: 20.0000 mg | ORAL_TABLET | Freq: Every day | ORAL | 4 refills | Status: DC | PRN

## 2016-03-28 MED ORDER — INSULIN NPH (HUMAN) (ISOPHANE) 100 UNIT/ML ~~LOC~~ SUSP: SUBCUTANEOUS | 0 refills | Status: DC

## 2016-03-28 NOTE — Patient Instructions (Addendum)
Diabetes and Sick Day Management Blood sugar (glucose) can be more difficult to control when you are sick. Colds, fever, flu, nausea, vomiting, and diarrhea are all examples of common illnesses that can cause problems for people with diabetes. Loss of body fluids (dehydration) from fever, vomiting, diarrhea, infection, and the stress of a sickness can all cause blood glucose levels to increase. Because of this, it is very important to take your diabetes medicines and to eat some form of carbohydrate food when you are sick. Liquid or soft foods are often tolerated, and they help to replace fluids. HOME CARE INSTRUCTIONS These main guidelines are intended for managing a short-term (24 hours or less) sickness:  Take your usual dose of insulin or oral diabetes medicine. An exception would be if you take any form of metformin. If you cannot eat or drink, you can become dehydrated and should not take this medicine.  Continue to take your insulin even if you are unable to eat solid foods or are vomiting. Your insulin dose may stay the same, or it may need to be increased when you are sick.  You will need to test your blood glucose more often, generally every 2-4 hours. If you have type 1 diabetes, test your urine for ketones every 4 hours. If you have type 2 diabetes, test your urine for ketones as directed by your health care provider.  Eat some form of food that contains carbohydrates. The carbohydrates can be in solid or liquid form. You should eat 45-50 g of carbohydrates every 3-4 hours.  Replace fluids if you have a fever, vomit, or have diarrhea. Ask your health care provider for specific rehydration instructions. Drinks juices, Gatorade or Pedialyte if you cannot eat solid foods  Be careful with over-the-counter medicines. Read the labels. They may contain sugar or types of sugars that can increase your blood glucose level.  Food Choices for Illness All of the food choices below contain about 15 g  of carbohydrates. Plan ahead and keep some of these foods around.    to  cup carbonated beverage containing sugar. Carbonated beverages will usually be better tolerated if they are opened and left at room temperature for a few minutes.   of a twin frozen ice pop.   cup regular gelatin.   cup juice.   cup ice cream or frozen yogurt.   cup cooked cereal.   cup sherbet.  1 cup clear broth or soup.  1 cup cream soup.   cup regular custard.   cup regular pudding.  1 cup sports drink.  1 cup plain yogurt.  1 slice toast.  6 squares saltine crackers.  5 vanilla wafers. SEEK MEDICAL CARE IF:   You are unable to drink fluids, even small amounts.  You have nausea and vomiting for more than 6 hours.  You have diarrhea for more than 6 hours.  Your blood glucose level is more than 240 mg/dL, even with additional insulin.  There is a change in mental status.  You develop an additional serious sickness.  You have been sick for 2 days and are not getting better.  You have a fever.   Hypoglycemia Hypoglycemia occurs when the glucose in your blood is too low. (less than 80) Glucose is a type of sugar that is your body's main energy source. Hormones, such as insulin and glucagon, control the level of glucose in the blood. Insulin lowers blood glucose and glucagon increases blood glucose. Having too much insulin in your blood  stream, or not eating enough food containing sugar, can result in hypoglycemia. Hypoglycemia can happen to people with or without diabetes. It can develop quickly and can be a medical emergency.  CAUSES   Missing or delaying meals.  Not eating enough carbohydrates at meals.  Taking too much diabetes medicine.  Not timing your oral diabetes medicine or insulin doses with meals, snacks, and exercise.  Nausea and vomiting.  Certain medicines.  Severe illnesses, such as hepatitis, kidney disorders, and certain eating disorders.  Increased  activity or exercise without eating something extra or adjusting medicines.  Drinking too much alcohol.  A nerve disorder that affects body functions like your heart rate, blood pressure, and digestion (autonomic neuropathy).  A condition where the stomach muscles do not function properly (gastroparesis). Therefore, medicines and food may not absorb properly.  Rarely, a tumor of the pancreas can produce too much insulin. SYMPTOMS   Hunger.  Sweating (diaphoresis).  Change in body temperature.  Shakiness.  Headache.  Anxiety.  Lightheadedness.  Irritability.  Difficulty concentrating.  Dry mouth.  Tingling or numbness in the hands or feet.  Restless sleep or sleep disturbances.  Altered speech and coordination.  Change in mental status.  Seizures or prolonged convulsions.  Combativeness.  Drowsiness (lethargic).  Weakness.  Increased heart rate or palpitations.  Confusion.  Pale, gray skin color.  Blurred or double vision.  Fainting. TREATMENT  Usually, you can easily treat your hypoglycemia when you notice symptoms. Check your blood glucose. If it is less than 80 mg/dl, take one of the following:    3 or 4 glucose tablets.    cup or 4 ounces of juice.    cup or 4 ounces of regular soda.   1 cup skim milk.   -1 tube of glucose gel.   5 or 6 hard candies that are not sugar free like peppermint, butterscotch   Avoid high-fat drinks or food that may delay a rise in blood glucose levels.  Do not take more than the recommended amount of sugary foods, drinks, gel, or tablets. Doing so will cause your blood glucose to go too high.   Wait 10-15 minutes and recheck your blood glucose. If it is still less than 80 mg/dl or below your target range, repeat treatment.   Eat a snack if it is more than 1 hour until your next meal.   There may be a time when your blood glucose may go so low that you are unable to treat yourself at home when  you start to notice symptoms. You may need someone to help you. You may even faint or be unable to swallow. If you cannot treat yourself, someone will need to bring you to the hospital.  SEEK MEDICAL CARE IF:   You are having problems keeping your blood glucose in your target range.  You are having frequent episodes of hypoglycemia.  You feel you might be having side effects from your medicines.  You are not sure why your blood glucose is dropping so low.  You notice a change in vision or a new problem with your vision. SEEK IMMEDIATE MEDICAL CARE IF:   Confusion develops.  A change in mental status occurs.  The inability to swallow develops.  Fainting occurs.

## 2016-03-28 NOTE — Progress Notes (Signed)
Patient ID: [name] [name], female   DOB: [birth year], 82 y.o.   MRN: [id number] Subjective:    [name] [name] is a 82 y.o. female who presents for reevaluation of Type 2 diabetes mellitus, HTN and medications review.  Current medications for diabetes includes:  Humulin NPH 12 units qd, glimepride 4mg  1 tablet daily and metformin XR 1000mg  1 tablet bid.   At our last visit 3 months ago stopped lisinopril because she is taking generic lotrel (benazepril + amlodipine) for BP and also metoprolol  Edema -  Patient feels edema is well controlled but that the furosemide dose is took high because she gets up several times during the night to urinate.  I verified twice that patient is taking furosemide 40mg  qd in the morning.   Known diabetic complications: peripheral neuropathy and cardiovascular disease Cardiovascular risk factors: advanced age (older than 4055 for men, 865 for women), diabetes mellitus, dyslipidemia, family history of premature cardiovascular disease, hypertension, obesity (BMI >= 30 kg/m2) and sedentary lifestyle  Eye exam current (within one year): per patient she saw Dr Laveda Normanran earlier this year. Weight trend: has decreased by 7# since our last visit 12/2015 Prior visit with CDE: yes Current diet: has improved but patient admits to times of high CHO intake Current exercise: no regular exercise  Patient checks BG 1-2 times daily.  Lowest 69 and highest 279.  69 occurred while she was sick / nauseated and the 279 was a results of treating the low of 69. Has had about 2 other hypoglycemic episodes over the last 60 days.  Is She on ACE inhibitor or angiotensin II receptor blocker?  Yes The following portions of the patient's history were reviewed and updated as appropriate: allergies, current medications, past family history, past medical history, past social history, past surgical history and problem list.   Review of Systems  Constitutional: Positive for weight loss.  HENT: Negative.    Eyes: Negative.   Respiratory: Negative.   Cardiovascular: Positive for leg swelling (Legs swelling during day but normal in am). Negative for chest pain, palpitations, orthopnea, claudication and PND.  Gastrointestinal: Negative.   Genitourinary: Positive for frequency (pt reports nocturia - wakes about every 2 to 3 hours.). Negative for dysuria, flank pain, hematuria and urgency.          Musculoskeletal: Positive for myalgias (reports that legs feel weak in the morning. ). Negative for falls.  Skin: Negative for rash.  Neurological: Negative.   Endo/Heme/Allergies: Negative.   Psychiatric/Behavioral: The patient has insomnia (not sleeping well at night per patient this is because she is getting up several times during the night to urinate.).     Objective:    BP 128/64   Pulse 60   Ht 5\' 10"  (1.778 m)   Wt 226 lb 8 oz (102.7 kg)   BMI 32.50 kg/m   Trace edema right lower extremity No edema left lower extremity  Lab Review Glucose (mg/dL)  Date Value  14/78/295609/26/2017 72  12/02/2015 78  08/26/2015 119 (H)   Glucose, Bld (mg/dL)  Date Value  21/30/865710/09/2015 143 (H)  12/04/2012 153 (H)  06/20/2012 146 (H)   CO2 (mmol/L)  Date Value  03/14/2016 28  03/06/2016 26  12/02/2015 24   BUN (mg/dL)  Date Value  84/69/629510/09/2015 8  03/06/2016 12  12/02/2015 10  08/26/2015 15   Creat (mg/dL)  Date Value  28/41/324406/26/2014 0.88  06/20/2012 0.95  12/04/2010 1.05   Creatinine, Ser (mg/dL)  Date Value  01/02/725310/09/2015  0.66  03/06/2016 0.75  12/02/2015 0.87    Last A1c = 6.5%  Assessment:    Diabetes Mellitus type II, few high BG readings but I am concerned about the few lows she has experienced.  HTN - BP at goal and patient is taking AEC-I Hyperlipidemia - controlled with current medication Obesity - has lost about 15# since about 1 year ago Edema - adequate control Nocturia / Insomnia - patient is concerned furosemide if increase nocturia and affecting sleep  Plan:    1.  Rx  changes:   Decrease Insulin NPH to 10 units BID  Continue metformin to 1000mg - take 1 tablet BID with food-  Continue glimepiride to 4mg  1 tablet daily  Decrease furosemide 40mg  to 1/2 to 1 tablet each morning and reminded patient she can take only if she feel she needs it.   2.  High Risk meds:   D/c cyclobenzaprine - patient has not taken in several months.  Discussed risk vs benefit of alprazolam - she only takes at night prn sleep.  Hopefully with change in furosemide she will not need alprazolam for sleep / anxiety in future. 3.  Education: Reviewed 'ABCs' of diabetes management and treatment goals (respective goals in parentheses):  A1C (<7), blood pressure (<130/80), and cholesterol (LDL <100).  4. Continue with current dietary changes - great job with weight loss. 5. Requested note from diabetic eye exam from Dr Carmela Rima office. 6.  Discussed treatment of hypoglycemia and plan for sick days 7.   RTC in 2 month to see PCP;  AWV with me 12/2016     Henrene Pastor, PharmD, CPP, CDE Patient ID: [name] [name], female   DOB: Jul 28, 1932, 82 y.o.   MRN: [id number]

## 2016-04-02 ENCOUNTER — Other Ambulatory Visit: Payer: Self-pay | Admitting: Family

## 2016-04-02 ENCOUNTER — Ambulatory Visit: Payer: Self-pay | Admitting: Pharmacist

## 2016-04-03 ENCOUNTER — Other Ambulatory Visit: Payer: Self-pay | Admitting: Family Medicine

## 2016-04-09 ENCOUNTER — Other Ambulatory Visit: Payer: Self-pay | Admitting: Family

## 2016-04-26 ENCOUNTER — Other Ambulatory Visit: Payer: Self-pay | Admitting: Family

## 2016-04-26 NOTE — Telephone Encounter (Signed)
Pt states that she need an antibiotic for the red, rash like area between her legs - it has now came up on her belly and around her backside. She states that it has an odor and is draining some. She cant come in - she doesn't have anyone to watch her husband.

## 2016-04-27 NOTE — Telephone Encounter (Signed)
Patient aware, appointment made 05/02/16 at 3:55 pm [facility]

## 2016-04-27 NOTE — Telephone Encounter (Signed)
Patient NTBS  

## 2016-05-02 ENCOUNTER — Ambulatory Visit (INDEPENDENT_AMBULATORY_CARE_PROVIDER_SITE_OTHER): Payer: Medicare Other | Admitting: Family

## 2016-05-02 ENCOUNTER — Encounter: Payer: Self-pay | Admitting: Family

## 2016-05-02 ENCOUNTER — Other Ambulatory Visit: Payer: Self-pay | Admitting: Family

## 2016-05-02 VITALS — BP 137/70 | HR 64 | Temp 97.1°F | Ht 70.0 in | Wt 220.0 lb

## 2016-05-02 DIAGNOSIS — M25511 Pain in right shoulder: Secondary | ICD-10-CM | POA: Diagnosis not present

## 2016-05-02 DIAGNOSIS — G8929 Other chronic pain: Secondary | ICD-10-CM | POA: Diagnosis not present

## 2016-05-02 DIAGNOSIS — B3789 Other sites of candidiasis: Secondary | ICD-10-CM

## 2016-05-02 DIAGNOSIS — M25561 Pain in right knee: Secondary | ICD-10-CM

## 2016-05-02 DIAGNOSIS — M150 Primary generalized (osteo)arthritis: Secondary | ICD-10-CM

## 2016-05-02 DIAGNOSIS — M8949 Other hypertrophic osteoarthropathy, multiple sites: Secondary | ICD-10-CM

## 2016-05-02 DIAGNOSIS — L089 Local infection of the skin and subcutaneous tissue, unspecified: Secondary | ICD-10-CM | POA: Diagnosis not present

## 2016-05-02 DIAGNOSIS — G6289 Other specified polyneuropathies: Secondary | ICD-10-CM | POA: Diagnosis not present

## 2016-05-02 DIAGNOSIS — M159 Polyosteoarthritis, unspecified: Secondary | ICD-10-CM

## 2016-05-02 MED ORDER — NYSTATIN-TRIAMCINOLONE 100000-0.1 UNIT/GM-% EX CREA: TOPICAL_CREAM | CUTANEOUS | 2 refills | Status: DC

## 2016-05-02 MED ORDER — FLUCONAZOLE 150 MG PO TABS
150.0000 mg | ORAL_TABLET | ORAL | 0 refills | Status: DC
Start: 2016-05-02 — End: 2016-06-15

## 2016-05-02 MED ORDER — PREDNISONE 10 MG (21) PO TBPK: ORAL_TABLET | ORAL | 0 refills | Status: DC

## 2016-05-02 MED ORDER — HYDROCODONE-ACETAMINOPHEN 5-325 MG PO TABS: 1.0000 | ORAL_TABLET | Freq: Four times a day (QID) | ORAL | 0 refills | Status: DC | PRN

## 2016-05-02 MED ORDER — NYSTATIN 100000 UNIT/GM EX POWD: Freq: Three times a day (TID) | CUTANEOUS | 2 refills | Status: DC

## 2016-05-02 MED ORDER — HYDROCODONE-ACETAMINOPHEN 5-325 MG PO TABS: ORAL_TABLET | ORAL | 0 refills | Status: DC

## 2016-05-02 MED ORDER — CEPHALEXIN 500 MG PO CAPS: 500.0000 mg | ORAL_CAPSULE | Freq: Three times a day (TID) | ORAL | 0 refills | Status: DC

## 2016-05-02 NOTE — Progress Notes (Signed)
   Subjective:    Patient ID: [name] M Moncus, female    DOB: 03-11-1933, 82 y.o.   MRN: [id number]  Rash  This is a new problem. The current episode started more than 1 month ago. The problem has been gradually worsening since onset. The affected locations include the abdomen and groin. The rash is characterized by blistering, burning, pain and redness. Pertinent negatives include no congestion or sore throat. Past treatments include moisturizer (fungal cream). The treatment provided no relief.  Shoulder Pain   The pain is present in the right shoulder. This is a chronic problem. The current episode started more than 1 month ago. There has been no history of extremity trauma. The problem occurs constantly. The problem has been waxing and waning. The quality of the pain is described as aching. The pain is at a severity of 8/10. The pain is moderate. Associated symptoms include joint locking and a limited range of motion. She has tried NSAIDS, acetaminophen, rest and oral narcotics for the symptoms. The treatment provided mild relief.  Knee Pain        Review of Systems  HENT: Negative for congestion and sore throat.   Skin: Positive for rash.  All other systems reviewed and are negative.      Objective:   Physical Exam  Constitutional: She appears well-developed and well-nourished.  Cardiovascular: Normal rate, regular rhythm, normal heart sounds and intact distal pulses.   Pulmonary/Chest: Effort normal and breath sounds normal.  Musculoskeletal: She exhibits tenderness.  Decreased ROM of right shoulder with flexion.  Skin: Skin is warm and dry. Rash noted. There is erythema.  Erythemas yeast rash under Panniculus with drainage     BP 137/70   Pulse 64   Temp 97.1 F (36.2 C) (Oral)   Ht 5\' 10"  (1.778 m)   Wt 220 lb (99.8 kg)   BMI 31.57 kg/m       Assessment & Plan:  1. Candida rash of groin -Keep clean and dry -Do not scratch - fluconazole (DIFLUCAN) 150 MG tablet;  Take 1 tablet (150 mg total) by mouth every 3 (three) days.  Dispense: 3 tablet; Refill: 0 - nystatin (MYCOSTATIN/NYSTOP) powder; Apply topically 3 (three) times daily.  Dispense: 60 g; Refill: 2 - nystatin-triamcinolone (MYCOLOG II) cream; APPLY TO AFFECTED AREA 3 TIMES A DAY  Dispense: 60 g; Refill: 2  2. Skin infection -Clean and dry -Pt started on Keflex and diflucan today - cephALEXin (KEFLEX) 500 MG capsule; Take 1 capsule (500 mg total) by mouth 3 (three) times daily.  Dispense: 21 capsule; Refill: 0  3. Pain in joint of right shoulder -Continue pain medications - predniSONE (STERAPRED UNI-PAK 21 TAB) 10 MG (21) TBPK tablet; Use as directed  Dispense: 21 tablet; Refill: 0  4. Primary osteoarthritis involving multiple joints - predniSONE (STERAPRED UNI-PAK 21 TAB) 10 MG (21) TBPK tablet; Use as directed  Dispense: 21 tablet; Refill: 0  Jannifer Rodneyhristy Rehema Muffley, FNP

## 2016-05-02 NOTE — Addendum Note (Signed)
Addended by: Jannifer RodneyHAWKS, Harriet Bollen A on: 05/02/2016 04:29 PM   Modules accepted: Orders

## 2016-05-02 NOTE — Patient Instructions (Signed)
Skin Yeast Infection Skin yeast infection is a condition in which there is an overgrowth of yeast (candida) that normally lives on the skin. This condition usually occurs in areas of the skin that are constantly warm and moist, such as the armpits or the groin. What are the causes? This condition is caused by a change in the normal balance of the yeast and bacteria that live on the skin. What increases the risk? This condition is more likely to develop in:  People who are obese.  Pregnant women.  Women who take birth control pills.  People who have diabetes.  People who take antibiotic medicines.  People who take steroid medicines.  People who are malnourished.  People who have a weak defense (immune) system.  People who are 65 years of age or older. What are the signs or symptoms? Symptoms of this condition include:  A red, swollen area of the skin.  Bumps on the skin.  Itchiness. How is this diagnosed? This condition is diagnosed with a medical history and physical exam. Your health care provider may check for yeast by taking light scrapings of the skin to be viewed under a microscope. How is this treated? This condition is treated with medicine. Medicines may be prescribed or be available over-the-counter. The medicines may be:  Taken by mouth (orally).  Applied as a cream. Follow these instructions at home:  Take or apply over-the-counter and prescription medicines only as told by your health care provider.  Eat more yogurt. This may help to keep your yeast infection from returning.  Maintain a healthy weight. If you need help losing weight, talk with your health care provider.  Keep your skin clean and dry.  If you have diabetes, keep your blood sugar under control. Contact a health care provider if:  Your symptoms go away and then return.  Your symptoms do not get better with treatment.  Your symptoms get worse.  Your rash spreads.  You have a fever  or chills.  You have new symptoms.  You have new warmth or redness of your skin. This information is not intended to replace advice given to you by your health care provider. Make sure you discuss any questions you have with your health care provider. Document Released: 02/13/2011 Document Revised: 01/22/2016 Document Reviewed: 11/29/2014 Elsevier Interactive Patient Education  2017 Elsevier Inc.  

## 2016-05-04 ENCOUNTER — Other Ambulatory Visit: Payer: Self-pay | Admitting: Family

## 2016-05-25 DIAGNOSIS — I119 Hypertensive heart disease without heart failure: Secondary | ICD-10-CM | POA: Diagnosis not present

## 2016-05-25 DIAGNOSIS — M7651 Patellar tendinitis, right knee: Secondary | ICD-10-CM | POA: Diagnosis not present

## 2016-05-25 DIAGNOSIS — G8929 Other chronic pain: Secondary | ICD-10-CM | POA: Diagnosis not present

## 2016-05-25 DIAGNOSIS — M5441 Lumbago with sciatica, right side: Secondary | ICD-10-CM | POA: Diagnosis not present

## 2016-05-25 DIAGNOSIS — M25461 Effusion, right knee: Secondary | ICD-10-CM | POA: Diagnosis not present

## 2016-05-25 DIAGNOSIS — Z794 Long term (current) use of insulin: Secondary | ICD-10-CM | POA: Diagnosis not present

## 2016-05-25 DIAGNOSIS — Z880 Allergy status to penicillin: Secondary | ICD-10-CM | POA: Diagnosis not present

## 2016-05-25 DIAGNOSIS — M25561 Pain in right knee: Secondary | ICD-10-CM | POA: Diagnosis not present

## 2016-05-25 DIAGNOSIS — Z96651 Presence of right artificial knee joint: Secondary | ICD-10-CM | POA: Diagnosis not present

## 2016-05-25 DIAGNOSIS — Z79899 Other long term (current) drug therapy: Secondary | ICD-10-CM | POA: Diagnosis not present

## 2016-05-25 DIAGNOSIS — Z96653 Presence of artificial knee joint, bilateral: Secondary | ICD-10-CM | POA: Diagnosis not present

## 2016-05-25 DIAGNOSIS — E119 Type 2 diabetes mellitus without complications: Secondary | ICD-10-CM | POA: Diagnosis not present

## 2016-05-29 ENCOUNTER — Ambulatory Visit: Payer: Medicare Other | Attending: Surgical | Admitting: Physical Therapy

## 2016-05-29 ENCOUNTER — Other Ambulatory Visit: Payer: Self-pay | Admitting: Pharmacist

## 2016-05-29 ENCOUNTER — Other Ambulatory Visit: Payer: Self-pay | Admitting: Family

## 2016-05-29 DIAGNOSIS — M25561 Pain in right knee: Secondary | ICD-10-CM | POA: Insufficient documentation

## 2016-05-29 DIAGNOSIS — M545 Low back pain: Secondary | ICD-10-CM | POA: Insufficient documentation

## 2016-05-29 DIAGNOSIS — G8929 Other chronic pain: Secondary | ICD-10-CM | POA: Diagnosis not present

## 2016-05-29 DIAGNOSIS — F411 Generalized anxiety disorder: Secondary | ICD-10-CM

## 2016-05-29 DIAGNOSIS — R293 Abnormal posture: Secondary | ICD-10-CM

## 2016-05-29 NOTE — Therapy (Signed)
[facility] [address] [place], [place], [address] Phone: [phone]   Fax:  [phone]  Physical Therapy Evaluation  Patient Details  Name: [name] [name] MRN: [id number] Date of Birth: Jul 01, 1932 Referring Provider: Mariana Arn. PA-C  Encounter Date: 05/29/2016      PT End of Session - 05/29/16 1627    Visit Number 1   Number of Visits 12   Date for PT Re-Evaluation 07/28/16   PT Start Time 0317   PT Stop Time 0404   PT Time Calculation (min) 47 min   Activity Tolerance Patient tolerated treatment well   Behavior During Therapy [facility] for tasks assessed/performed      Past Medical History:  Diagnosis Date  . ANXIETY   . Arteriosclerotic cardiovascular disease (ASCVD)    Cath in 9/99-70% mid LAD with diffuse distal disease, 70% T1, 60% mid circumflex, 50% mid RCA, normal ejection fraction; negative stress nuclear in 07/2008  . Carpal tunnel syndrome   . Cerebrovascular disease    carotid bruits; no focal disease in 1999, 2002 and 2006  . Closed left fibular fracture 11/30/11  . COPD   . Diabetes mellitus    Insulin treatment  . Diastolic CHF, chronic (HCC)    Normal EF  . Gastroesophageal reflux disease    With hiatal hernia; irritable bowel syndrome; H. pylori-treated; Colonoscopy 2008: non-specific colitis, IH, Diverticulosis, Rectal ulcer secondary to ASA  . Hyperlipidemia   . Hypertension   . Hypothyroidism   . OSTEOARTHRITIS, KNEES, BILATERAL    Bilateral TKA  . Pacemaker   . Peripheral neuropathy (HCC)   . Sinoatrial node dysfunction (HCC) 2003   Medtronic dual-chamber device  . Sleep apnea   . VITAMIN B12 DEFICIENCY     Past Surgical History:  Procedure Laterality Date  . ABDOMINAL HYSTERECTOMY    . BLADDER REPAIR    . CARDIAC PACEMAKER PLACEMENT  2003   Medtronic, dual-chamber  . CARPAL TUNNEL RELEASE    . CHOLECYSTECTOMY    . COLONOSCOPY  2008  . HEMORRHOID SURGERY    . NASAL FRACTURE  SURGERY    . THYROIDECTOMY  1980   Goiter  . TOTAL KNEE ARTHROPLASTY     Bilateral    There were no vitals filed for this visit.       Subjective Assessment - 05/29/16 1622    Subjective The patient reports increasing right sided low back and right knee knee pain for more than 6 months.  She is unable to perform household chores due to a rising pain-level.  Her pain is rated at a 7/10 today.  Rest helps to decrease her pain.  The patient also reports her right LE feels weak.   Patient Stated Goals Decrease my pain and get stronger.   Pain Score 7    Pain Location Back   Pain Orientation Right   Pain Descriptors / Indicators Aching   Pain Type Chronic pain   Pain Onset More than a month ago   Pain Frequency Constant   Aggravating Factors  See above.   Pain Relieving Factors See above.   Multiple Pain Sites Yes   Pain Score 7   Pain Location Knee   Pain Orientation Right   Pain Descriptors / Indicators Aching   Pain Type Chronic pain   Pain Onset More than a month ago   Pain Frequency Constant   Aggravating Factors  See above.   Pain Relieving Factors See above.  [facility] PT Assessment - 05/29/16 0001      Assessment   Medical Diagnosis Lowback and right knee pain.   Referring Provider [facility] PA-C   Onset Date/Surgical Date --  6 months+.     Precautions   Precautions --  PACEMAKER.     Restrictions   Weight Bearing Restrictions No     Balance Screen   Has the patient fallen in the past 6 months No   Has the patient had a decrease in activity level because of a fear of falling?  Yes   Is the patient reluctant to leave their home because of a fear of falling?  No     Home Environment   Living Environment Private residence     Prior Function   Level of Independence Independent     Posture/Postural Control   Posture/Postural Control Postural limitations   Postural Limitations Rounded Shoulders;Forward head;Decreased lumbar  lordosis;Flexed trunk     ROM / Strength   AROM / PROM / Strength AROM;Strength     AROM   Overall AROM Comments Active lumbar flexion is 75% of normal and lumbar extension is -10 degrees from the neutral position.  Her right knee is WFL (she has had 2 previous TKA's)     Strength   Overall Strength Comments Right hip flexion= 3-/5; hip abduction= 3-/5; right knee extension= 4-/5.     Palpation   Palpation comment Tender to palpation over right SIJ region and lateral right patella.     Ambulation/Gait   Gait Comments Antalgic gait pattern with patient walking in spinal flexion.                   [facility] Adult PT Treatment/Exercise - 05/29/16 0001      Modalities   Modalities Moist Heat     Moist Heat Therapy   Number Minutes Moist Heat 15 Minutes   Moist Heat Location --  Low back.                  PT Short Term Goals - 05/29/16 1729      PT SHORT TERM GOAL #1   Title STG's=LTG's.           PT Long Term Goals - 05/29/16 1730      PT LONG TERM GOAL #1   Title Independent with a HEP.   Time 8   Period Weeks   Status New     PT LONG TERM GOAL #2   Title Perform ADL's with with pain not > 3/10.   Time 8   Period Weeks   Status New     PT LONG TERM GOAL #3   Title Achieve lumbar extension to 10 degrees to decrease joint reaction forces while walking and performing functional activites.   Time 8   Period Weeks   Status New               Plan - 05/29/16 1721    Clinical Impression Statement The patient has progressively worsening low back and right knee.  She has limitations of spinal extension and her pain reaches high levels which prevent her from performing her ADL's.  She is also quite weak over her right LE.  She walks in spinal flexion.   Rehab Potential Good   PT Frequency 2x / week   PT Duration 8 weeks   PT Treatment/Interventions Moist Heat;Therapeutic activities;Therapeutic exercise;Neuromuscular  re-education;Patient/family education;Manual techniques   PT Next Visit Plan STW/M  to right SIJ; Right LE strengthening; Nustep.     Consulted and Agree with Plan of Care Patient      Patient will benefit from skilled therapeutic intervention in order to improve the following deficits and impairments:  Pain, Decreased activity tolerance, Decreased strength, Decreased range of motion, Postural dysfunction  Visit Diagnosis: Chronic right-sided low back pain, with sciatica presence unspecified - Plan: PT plan of care cert/re-cert  Chronic pain of right knee - Plan: PT plan of care cert/re-cert  Abnormal posture - Plan: PT plan of care cert/re-cert      G-Codes - 05/29/16 1741    Functional Assessment Tool Used Clinical judgement..   Functional Limitation Mobility: Walking and moving around   Mobility: Walking and Moving Around Current Status [phone]([id number]) At least 60 percent but less than 80 percent impaired, limited or restricted   Mobility: Walking and Moving Around Goal Status [phone]([id number]) At least 20 percent but less than 40 percent impaired, limited or restricted       Problem List Patient Active Problem List   Diagnosis Date Noted  . PVD (peripheral vascular disease) (HCC) 01/27/2016  . Diabetes mellitus, type 2 (HCC) 07/19/2014  . Skin moniliasis 05/31/2014  . OAB (overactive bladder) 12/23/2012  . Insomnia 04/06/2011  . Sinoatrial node dysfunction (HCC)   . Gastroesophageal reflux disease   . Type II diabetes mellitus with peripheral autonomic neuropathy (HCC)   . GAD (generalized anxiety disorder) 12/30/2009  . Peripheral neuropathy (HCC) 12/26/2009  . Vitamin B 12 deficiency 10/11/2009  . Morbid obesity (HCC) 09/16/2009  . Arteriosclerotic cardiovascular disease (ASCVD) 09/15/2009  . CEREBROVASCULAR DISEASE 09/15/2009  . Hyperlipidemia 08/29/2009  . Hypertension 08/29/2009  . Osteoarthrosis, unspecified whether generalized or localized, involving lower leg 08/29/2009  .  COLONIC POLYPS, ADENOMATOUS, HX OF 02/22/2009  . Hypothyroid 02/17/2009  . Chronic obstructive pulmonary disease (HCC) 02/17/2009  . Chronic diastolic heart failure (HCC) 12/15/2008  . PPM-Medtronic 12/15/2008    Xitlally Mooneyham, [place] MPT 05/29/2016, 5:45 PM  [facility] [address] [place], [place], [address] Phone: [phone]   Fax:  [phone]  Name: [name] M [place] MRN: [id number] Date of Birth: 12/21/1932

## 2016-05-31 NOTE — Telephone Encounter (Signed)
rx called into pharmacy

## 2016-06-06 ENCOUNTER — Ambulatory Visit: Payer: Medicare Other | Admitting: *Deleted

## 2016-06-12 ENCOUNTER — Ambulatory Visit: Payer: Medicare Other | Attending: Surgical | Admitting: *Deleted

## 2016-06-12 DIAGNOSIS — R293 Abnormal posture: Secondary | ICD-10-CM

## 2016-06-12 DIAGNOSIS — G8929 Other chronic pain: Secondary | ICD-10-CM

## 2016-06-12 DIAGNOSIS — M545 Low back pain: Secondary | ICD-10-CM | POA: Insufficient documentation

## 2016-06-12 DIAGNOSIS — M25561 Pain in right knee: Secondary | ICD-10-CM | POA: Insufficient documentation

## 2016-06-12 NOTE — Therapy (Signed)
[facility] [address] [place], [place], [address] Phone: [phone]   Fax:  [phone]  Physical Therapy Treatment  Patient Details  Name: [name] [name] M Olesen MRN: [id number] Date of Birth: 12-15-1932 Referring Provider: Mariana ArnVincent Jodell Hill Jr. PA-C  Encounter Date: 06/12/2016      PT End of Session - 06/12/16 1346    Visit Number 2   Number of Visits 12   Date for PT Re-Evaluation 07/28/16   PT Start Time 1345   PT Stop Time 1445   PT Time Calculation (min) 60 min      Past Medical History:  Diagnosis Date  . ANXIETY   . Arteriosclerotic cardiovascular disease (ASCVD)    Cath in 9/99-70% mid LAD with diffuse distal disease, 70% T1, 60% mid circumflex, 50% mid RCA, normal ejection fraction; negative stress nuclear in 07/2008  . Carpal tunnel syndrome   . Cerebrovascular disease    carotid bruits; no focal disease in 1999, 2002 and 2006  . Closed left fibular fracture 11/30/11  . COPD   . Diabetes mellitus    Insulin treatment  . Diastolic CHF, chronic (HCC)    Normal EF  . Gastroesophageal reflux disease    With hiatal hernia; irritable bowel syndrome; H. pylori-treated; Colonoscopy 2008: non-specific colitis, IH, Diverticulosis, Rectal ulcer secondary to ASA  . Hyperlipidemia   . Hypertension   . Hypothyroidism   . OSTEOARTHRITIS, KNEES, BILATERAL    Bilateral TKA  . Pacemaker   . Peripheral neuropathy (HCC)   . Sinoatrial node dysfunction (HCC) 2003   Medtronic dual-chamber device  . Sleep apnea   . VITAMIN B12 DEFICIENCY     Past Surgical History:  Procedure Laterality Date  . ABDOMINAL HYSTERECTOMY    . BLADDER REPAIR    . CARDIAC PACEMAKER PLACEMENT  2003   Medtronic, dual-chamber  . CARPAL TUNNEL RELEASE    . CHOLECYSTECTOMY    . COLONOSCOPY  2008  . HEMORRHOID SURGERY    . NASAL FRACTURE SURGERY    . THYROIDECTOMY  1980   Goiter  . TOTAL KNEE ARTHROPLASTY     Bilateral    There were no vitals filed  for this visit.      Subjective Assessment - 06/12/16 1341    Subjective The patient reports increasing right sided low back and right knee knee pain for more than 6 months.  She is unable to perform household chores due to a rising pain-level.  Her pain is rated at a 7/10 today.  Rest helps to decrease her pain.  The patient also reports her right LE feels weak.   Patient Stated Goals Decrease my pain and get stronger.   Currently in Pain? Yes   Pain Score 7    Pain Location Back   Pain Orientation Right   Pain Descriptors / Indicators Aching   Pain Type Chronic pain   Pain Onset More than a month ago   Pain Frequency Constant                         [facility] Adult PT Treatment/Exercise - 06/12/16 0001      Exercises   Exercises Knee/Hip;Lumbar     Knee/Hip Exercises: Aerobic   Nustep L3 seat x 10 mins UE/LE activity with focus on posture     Knee/Hip Exercises: Seated   Long Arc Quad Right;3 sets;10 reps   Long Arc Quad Weight 2 lbs.     Modalities  Modalities Moist Heat     Moist Heat Therapy   Number Minutes Moist Heat 15 Minutes  In LT sidelying     Manual Therapy   Manual Therapy Myofascial release;Soft tissue mobilization   Soft tissue mobilization RT SIJ LIGAMENT  AND STW TO BIL. lumbar paras with Pt Lt sidelying                  PT Short Term Goals - 05/29/16 1729      PT SHORT TERM GOAL #1   Title STG's=LTG's.           PT Long Term Goals - 05/29/16 1730      PT LONG TERM GOAL #1   Title Independent with a HEP.   Time 8   Period Weeks   Status New     PT LONG TERM GOAL #2   Title Perform ADL's with with pain not > 3/10.   Time 8   Period Weeks   Status New     PT LONG TERM GOAL #3   Title Achieve lumbar extension to 10 degrees to decrease joint reaction forces while walking and performing functional activites.   Time 8   Period Weeks   Status New               Plan - 06/12/16 1401    Clinical  Impression Statement Pt did fairly well with therex today for LB and RT LE. She was very tender along  RT SIJ during STW and only light pressure was tolerated. She was not as tender along lumbar paras. She got a little dizzy during Rx , but had a piece of hard candy and was fine. She does not use an AD at this time for gait.    Rehab Potential Good   PT Frequency 2x / week   PT Duration 8 weeks   PT Treatment/Interventions Moist Heat;Therapeutic activities;Therapeutic exercise;Neuromuscular re-education;Patient/family education;Manual techniques   PT Next Visit Plan STW/M to right SIJ; Right LE strengthening; Nustep.     Consulted and Agree with Plan of Care Patient      Patient will benefit from skilled therapeutic intervention in order to improve the following deficits and impairments:  Pain, Decreased activity tolerance, Decreased strength, Decreased range of motion, Postural dysfunction  Visit Diagnosis: Chronic right-sided low back pain, with sciatica presence unspecified  Chronic pain of right knee  Abnormal posture     Problem List Patient Active Problem List   Diagnosis Date Noted  . PVD (peripheral vascular disease) (HCC) 01/27/2016  . Diabetes mellitus, type 2 (HCC) 07/19/2014  . Skin moniliasis 05/31/2014  . OAB (overactive bladder) 12/23/2012  . Insomnia 04/06/2011  . Sinoatrial node dysfunction (HCC)   . Gastroesophageal reflux disease   . Type II diabetes mellitus with peripheral autonomic neuropathy (HCC)   . GAD (generalized anxiety disorder) 12/30/2009  . Peripheral neuropathy (HCC) 12/26/2009  . Vitamin B 12 deficiency 10/11/2009  . Morbid obesity (HCC) 09/16/2009  . Arteriosclerotic cardiovascular disease (ASCVD) 09/15/2009  . CEREBROVASCULAR DISEASE 09/15/2009  . Hyperlipidemia 08/29/2009  . Hypertension 08/29/2009  . Osteoarthrosis, unspecified whether generalized or localized, involving lower leg 08/29/2009  . COLONIC POLYPS, ADENOMATOUS, HX OF  02/22/2009  . Hypothyroid 02/17/2009  . Chronic obstructive pulmonary disease (HCC) 02/17/2009  . Chronic diastolic heart failure (HCC) 12/15/2008  . PPM-Medtronic 12/15/2008    Ressie Slevin,CHRIS, PTA 06/12/2016, 3:15 PM  [facility] [address] [place], [place], [address] Phone: [phone]  Fax:  [phone]  Name: NAOMIE CROW MRN: [id number] Date of Birth: 03/06/1933

## 2016-06-14 ENCOUNTER — Ambulatory Visit: Payer: Medicare Other | Admitting: Physical Therapy

## 2016-06-14 ENCOUNTER — Encounter: Payer: Self-pay | Admitting: Physical Therapy

## 2016-06-14 DIAGNOSIS — M545 Low back pain: Secondary | ICD-10-CM | POA: Diagnosis not present

## 2016-06-14 DIAGNOSIS — R293 Abnormal posture: Secondary | ICD-10-CM | POA: Diagnosis not present

## 2016-06-14 DIAGNOSIS — M25561 Pain in right knee: Secondary | ICD-10-CM

## 2016-06-14 DIAGNOSIS — G8929 Other chronic pain: Secondary | ICD-10-CM | POA: Diagnosis not present

## 2016-06-14 NOTE — Therapy (Signed)
[facility] [address] [place], [place], [address] Phone: [phone]   Fax:  [phone]  Physical Therapy Treatment  Patient Details  Name: [name] [name] MRN: [id number] Date of Birth: Oct 08, 1932 Referring Provider: Mariana Arn. PA-C  Encounter Date: 06/14/2016      PT End of Session - 06/14/16 1339    Visit Number 3   Number of Visits 12   Date for PT Re-Evaluation 07/28/16   PT Start Time 1315   PT Stop Time 1411   PT Time Calculation (min) 56 min   Activity Tolerance Patient tolerated treatment well   Behavior During Therapy [facility] for tasks assessed/performed      Past Medical History:  Diagnosis Date  . ANXIETY   . Arteriosclerotic cardiovascular disease (ASCVD)    Cath in 9/99-70% mid LAD with diffuse distal disease, 70% T1, 60% mid circumflex, 50% mid RCA, normal ejection fraction; negative stress nuclear in 07/2008  . Carpal tunnel syndrome   . Cerebrovascular disease    carotid bruits; no focal disease in 1999, 2002 and 2006  . Closed left fibular fracture 11/30/11  . COPD   . Diabetes mellitus    Insulin treatment  . Diastolic CHF, chronic (HCC)    Normal EF  . Gastroesophageal reflux disease    With hiatal hernia; irritable bowel syndrome; H. pylori-treated; Colonoscopy 2008: non-specific colitis, IH, Diverticulosis, Rectal ulcer secondary to ASA  . Hyperlipidemia   . Hypertension   . Hypothyroidism   . OSTEOARTHRITIS, KNEES, BILATERAL    Bilateral TKA  . Pacemaker   . Peripheral neuropathy (HCC)   . Sinoatrial node dysfunction (HCC) 2003   Medtronic dual-chamber device  . Sleep apnea   . VITAMIN B12 DEFICIENCY     Past Surgical History:  Procedure Laterality Date  . ABDOMINAL HYSTERECTOMY    . BLADDER REPAIR    . CARDIAC PACEMAKER PLACEMENT  2003   Medtronic, dual-chamber  . CARPAL TUNNEL RELEASE    . CHOLECYSTECTOMY    . COLONOSCOPY  2008  . HEMORRHOID SURGERY    . NASAL FRACTURE  SURGERY    . THYROIDECTOMY  1980   Goiter  . TOTAL KNEE ARTHROPLASTY     Bilateral    There were no vitals filed for this visit.      Subjective Assessment - 06/14/16 1320    Subjective Patient has reported ongoing pain in right low back/hip and right knee pain, today more pain in hip reported   Patient Stated Goals Decrease my pain and get stronger.   Currently in Pain? Yes   Pain Score --  back 1/10, hip 7/10   Pain Location Back  hip   Pain Orientation Right   Pain Descriptors / Indicators Aching   Pain Type Chronic pain   Pain Onset More than a month ago   Pain Frequency Constant   Aggravating Factors  prolong activity   Pain Relieving Factors at rest   Pain Score 4   Pain Location Knee   Pain Orientation Right   Pain Descriptors / Indicators Aching   Pain Type Chronic pain   Pain Onset More than a month ago   Pain Frequency Constant   Aggravating Factors  prolong activity   Pain Relieving Factors at rest                         [facility] Adult PT Treatment/Exercise - 06/14/16 0001      Lumbar  Exercises: Standing   Heel Raises 10 reps   Other Standing Lumbar Exercises marching and hip abd 2x10 each     Knee/Hip Exercises: Aerobic   Nustep L3 seat x 12 mins UE/LE activity with focus on posture     Knee/Hip Exercises: Seated   Long Arc Quad Right;3 sets;10 reps   Long Arc Quad Weight 2 lbs.     Moist Heat Therapy   Number Minutes Moist Heat 15 Minutes   Moist Heat Location Hip     Manual Therapy   Manual Therapy Myofascial release;Soft tissue mobilization   Soft tissue mobilization RT SIJ LIGAMENT  AND STW TO BIL. lumbar paras with Pt Lt sidelying                  PT Short Term Goals - 05/29/16 1729      PT SHORT TERM GOAL #1   Title STG's=LTG's.           PT Long Term Goals - 06/14/16 1341      PT LONG TERM GOAL #1   Title Independent with a HEP.   Time 8   Period Weeks   Status On-going     PT LONG TERM GOAL #2    Title Perform ADL's with with pain not > 3/10.   Time 8   Period Weeks   Status On-going     PT LONG TERM GOAL #3   Title Achieve lumbar extension to 10 degrees to decrease joint reaction forces while walking and performing functional activites.   Time 8   Period Weeks   Status On-going               Plan - 06/14/16 1349    Clinical Impression Statement Patient tolerated exercises well with no difficulty. Patient has increased palpable pain esp in right hip. Patient has reported improvement in kne and back today. Patient unable to meet any further goals due to pain deficts. Antalgic gait throughout today.    Rehab Potential Good   PT Frequency 2x / week   PT Duration 8 weeks   PT Treatment/Interventions Moist Heat;Therapeutic activities;Therapeutic exercise;Neuromuscular re-education;Patient/family education;Manual techniques   PT Next Visit Plan STW/M to right SIJ; Right LE strengthening; Nustep.     Consulted and Agree with Plan of Care Patient      Patient will benefit from skilled therapeutic intervention in order to improve the following deficits and impairments:  Pain, Decreased activity tolerance, Decreased strength, Decreased range of motion, Postural dysfunction  Visit Diagnosis: Chronic right-sided low back pain, with sciatica presence unspecified  Chronic pain of right knee  Abnormal posture     Problem List Patient Active Problem List   Diagnosis Date Noted  . PVD (peripheral vascular disease) (HCC) 01/27/2016  . Diabetes mellitus, type 2 (HCC) 07/19/2014  . Skin moniliasis 05/31/2014  . OAB (overactive bladder) 12/23/2012  . Insomnia 04/06/2011  . Sinoatrial node dysfunction (HCC)   . Gastroesophageal reflux disease   . Type II diabetes mellitus with peripheral autonomic neuropathy (HCC)   . GAD (generalized anxiety disorder) 12/30/2009  . Peripheral neuropathy (HCC) 12/26/2009  . Vitamin B 12 deficiency 10/11/2009  . Morbid obesity (HCC)  09/16/2009  . Arteriosclerotic cardiovascular disease (ASCVD) 09/15/2009  . CEREBROVASCULAR DISEASE 09/15/2009  . Hyperlipidemia 08/29/2009  . Hypertension 08/29/2009  . Osteoarthrosis, unspecified whether generalized or localized, involving lower leg 08/29/2009  . COLONIC POLYPS, ADENOMATOUS, HX OF 02/22/2009  . Hypothyroid 02/17/2009  . Chronic obstructive pulmonary disease (HCC)  02/17/2009  . Chronic diastolic heart failure (HCC) 12/15/2008  . PPM-Medtronic 12/15/2008    Hermelinda Dellen, PTA 06/14/2016, 2:13 PM  [facility] [address] [place], [place], [address] Phone: [phone]   Fax:  [phone]  Name: [name] [name] MRN: [id number] Date of Birth: 1932/12/09

## 2016-06-15 ENCOUNTER — Other Ambulatory Visit: Payer: Self-pay | Admitting: *Deleted

## 2016-06-15 DIAGNOSIS — B3789 Other sites of candidiasis: Secondary | ICD-10-CM

## 2016-06-15 MED ORDER — FLUCONAZOLE 150 MG PO TABS
150.0000 mg | ORAL_TABLET | ORAL | 0 refills | Status: DC
Start: 2016-06-15 — End: 2016-07-09

## 2016-06-19 ENCOUNTER — Other Ambulatory Visit: Payer: Self-pay | Admitting: Family

## 2016-06-19 ENCOUNTER — Ambulatory Visit: Payer: Medicare Other | Admitting: Physical Therapy

## 2016-06-19 DIAGNOSIS — R293 Abnormal posture: Secondary | ICD-10-CM | POA: Diagnosis not present

## 2016-06-19 DIAGNOSIS — M545 Low back pain: Secondary | ICD-10-CM | POA: Diagnosis not present

## 2016-06-19 DIAGNOSIS — B3789 Other sites of candidiasis: Secondary | ICD-10-CM

## 2016-06-19 DIAGNOSIS — M25561 Pain in right knee: Secondary | ICD-10-CM

## 2016-06-19 DIAGNOSIS — G8929 Other chronic pain: Secondary | ICD-10-CM

## 2016-06-19 NOTE — Therapy (Signed)
[facility] [address] [place], [place], [address] Phone: [phone]   Fax:  [phone]  Physical Therapy Treatment  Patient Details  Name: [name] [name] MRN: [id number] Date of Birth: Jun 17, 1932 Referring Provider: Mariana Arn. PA-C  Encounter Date: 06/19/2016      PT End of Session - 06/19/16 1309    Visit Number 4   Number of Visits 12   Date for PT Re-Evaluation 07/28/16   PT Start Time 1301   PT Stop Time 1353   PT Time Calculation (min) 52 min   Activity Tolerance Patient tolerated treatment well   Behavior During Therapy WFL for tasks assessed/performed      Past Medical History:  Diagnosis Date  . ANXIETY   . Arteriosclerotic cardiovascular disease (ASCVD)    Cath in 9/99-70% mid LAD with diffuse distal disease, 70% T1, 60% mid circumflex, 50% mid RCA, normal ejection fraction; negative stress nuclear in 07/2008  . Carpal tunnel syndrome   . Cerebrovascular disease    carotid bruits; no focal disease in 1999, 2002 and 2006  . Closed left fibular fracture 11/30/11  . COPD   . Diabetes mellitus    Insulin treatment  . Diastolic CHF, chronic (HCC)    Normal EF  . Gastroesophageal reflux disease    With hiatal hernia; irritable bowel syndrome; H. pylori-treated; Colonoscopy 2008: non-specific colitis, IH, Diverticulosis, Rectal ulcer secondary to ASA  . Hyperlipidemia   . Hypertension   . Hypothyroidism   . OSTEOARTHRITIS, KNEES, BILATERAL    Bilateral TKA  . Pacemaker   . Peripheral neuropathy (HCC)   . Sinoatrial node dysfunction (HCC) 2003   Medtronic dual-chamber device  . Sleep apnea   . VITAMIN B12 DEFICIENCY     Past Surgical History:  Procedure Laterality Date  . ABDOMINAL HYSTERECTOMY    . BLADDER REPAIR    . CARDIAC PACEMAKER PLACEMENT  2003   Medtronic, dual-chamber  . CARPAL TUNNEL RELEASE    . CHOLECYSTECTOMY    . COLONOSCOPY  2008  . HEMORRHOID SURGERY    . NASAL FRACTURE  SURGERY    . THYROIDECTOMY  1980   Goiter  . TOTAL KNEE ARTHROPLASTY     Bilateral    There were no vitals filed for this visit.      Subjective Assessment - 06/19/16 1304    Subjective Patient reports pain in her R knee, hip and both shoulders today. She states she almost fell today when her knee gave out, but she caught herself on her couch.   Patient Stated Goals Decrease my pain and get stronger.   Currently in Pain? Yes   Pain Score 5    Pain Location Hip   Pain Orientation Right   Pain Descriptors / Indicators Aching   Pain Type Chronic pain   Pain Onset More than a month ago   Pain Frequency Constant   Aggravating Factors  prolonged activity   Pain Relieving Factors rest   Multiple Pain Sites Yes   Pain Score 4   Pain Location Knee   Pain Orientation Right   Pain Descriptors / Indicators Aching   Pain Type Chronic pain   Pain Onset More than a month ago   Pain Frequency Constant                         [facility] Adult PT Treatment/Exercise - 06/19/16 0001      Knee/Hip Exercises: Stretches  Other Knee/Hip Stretches ITB/QL stretch off EOB 2 x 30 sec     Knee/Hip Exercises: Aerobic   Nustep L3 seat x 12 mins UE/LE activity with focus on posture     Knee/Hip Exercises: Standing   Hip Flexion Stengthening;Both;10 reps     Modalities   Modalities Moist Heat     Moist Heat Therapy   Number Minutes Moist Heat 10 Minutes   Moist Heat Location Hip  ITB R     Manual Therapy   Manual Therapy Myofascial release;Soft tissue mobilization   Soft tissue mobilization to R TFL, quads, gluteals, piriformis   Myofascial Release to R ITB                  PT Short Term Goals - 05/29/16 1729      PT SHORT TERM GOAL #1   Title STG's=LTG's.           PT Long Term Goals - 06/14/16 1341      PT LONG TERM GOAL #1   Title Independent with a HEP.   Time 8   Period Weeks   Status On-going     PT LONG TERM GOAL #2   Title Perform ADL's  with with pain not > 3/10.   Time 8   Period Weeks   Status On-going     PT LONG TERM GOAL #3   Title Achieve lumbar extension to 10 degrees to decrease joint reaction forces while walking and performing functional activites.   Time 8   Period Weeks   Status On-going               Plan - 06/19/16 1542    Clinical Impression Statement Patient presented today with complaints of increased right hip pain down leg and into her R knee today. She did well on the Nustep, but was unable to complete standing marching due to pain. She has significant trigger points throughout her R gluteals, TFL, quads and lateral HS some of which may be contributing to her LBP.  She would benefit from TP dry needling in addition to current manual therapy. Her goals are ongoing.   Rehab Potential Good   PT Frequency 2x / week   PT Duration 8 weeks   PT Treatment/Interventions Moist Heat;Therapeutic activities;Therapeutic exercise;Neuromuscular re-education;Patient/family education;Manual techniques;Dry needling   PT Next Visit Plan STW/M to right SIJ; Right LE strengthening; Nustep.  DN if approved.   PT Home Exercise Plan SDLY ITB/QL stretch   Consulted and Agree with Plan of Care Patient      Patient will benefit from skilled therapeutic intervention in order to improve the following deficits and impairments:  Pain, Decreased activity tolerance, Decreased strength, Decreased range of motion, Postural dysfunction  Visit Diagnosis: Chronic right-sided low back pain, with sciatica presence unspecified - Plan: PT plan of care cert/re-cert  Chronic pain of right knee - Plan: PT plan of care cert/re-cert     Problem List Patient Active Problem List   Diagnosis Date Noted  . PVD (peripheral vascular disease) (HCC) 01/27/2016  . Diabetes mellitus, type 2 (HCC) 07/19/2014  . Skin moniliasis 05/31/2014  . OAB (overactive bladder) 12/23/2012  . Insomnia 04/06/2011  . Sinoatrial node dysfunction (HCC)    . Gastroesophageal reflux disease   . Type II diabetes mellitus with peripheral autonomic neuropathy (HCC)   . GAD (generalized anxiety disorder) 12/30/2009  . Peripheral neuropathy (HCC) 12/26/2009  . Vitamin B 12 deficiency 10/11/2009  . Morbid obesity (HCC) 09/16/2009  .  Arteriosclerotic cardiovascular disease (ASCVD) 09/15/2009  . CEREBROVASCULAR DISEASE 09/15/2009  . Hyperlipidemia 08/29/2009  . Hypertension 08/29/2009  . Osteoarthrosis, unspecified whether generalized or localized, involving lower leg 08/29/2009  . COLONIC POLYPS, ADENOMATOUS, HX OF 02/22/2009  . Hypothyroid 02/17/2009  . Chronic obstructive pulmonary disease (HCC) 02/17/2009  . Chronic diastolic heart failure (HCC) 12/15/2008  . PPM-Medtronic 12/15/2008    Solon PalmJulie Teirra Carapia PT 06/19/2016, 3:54 PM  [facility] [address] [place], [place], [address] Phone: [phone]   Fax:  [phone]  Name: [name] M Eisman MRN: [id number] Date of Birth: 08-18-1932

## 2016-06-19 NOTE — Patient Instructions (Signed)
  Iliotibial Band Stretch, Side-Lying   Lie on left side, back to edge of bed, top arm in front. Allow top leg to drape behind over edge. Hold 30 or more seconds.  Repeat 3 times per session. Do _2-3 sessions per day.   Trigger Point Dry Needling  . What is Trigger Point Dry Needling (DN)? o DN is a physical therapy technique used to treat muscle pain and dysfunction. Specifically, DN helps deactivate muscle trigger points (muscle knots).  o A thin filiform needle is used to penetrate the skin and stimulate the underlying trigger point. The goal is for a local twitch response (LTR) to occur and for the trigger point to relax. No medication of any kind is injected during the procedure.   . What Does Trigger Point Dry Needling Feel Like?  o The procedure feels different for each individual patient. Some patients report that they do not actually feel the needle enter the skin and overall the process is not painful. Very mild bleeding may occur. However, many patients feel a deep cramping in the muscle in which the needle was inserted. This is the local twitch response.   Marland Kitchen. How Will I feel after the treatment? o Soreness is normal, and the onset of soreness may not occur for a few hours. Typically this soreness does not last longer than two days.  o Bruising is uncommon, however; ice can be used to decrease any possible bruising.  o In rare cases feeling tired or nauseous after the treatment is normal. In addition, your symptoms may get worse before they get better, this period will typically not last longer than 24 hours.   . What Can I do After My Treatment? o Increase your hydration by drinking more water for the next 24 hours. o You may place ice or heat on the areas treated that have become sore, however, do not use heat on inflamed or bruised areas. Heat often brings more relief post needling. o You can continue your regular activities, but vigorous activity is not recommended initially  after the treatment for 24 hours. o DN is best combined with other physical therapy such as strengthening, stretching, and other therapies.    Precautions:  In some cases, dry needling is done over the lung field. While rare, there is a risk of pneumothorax (punctured lung). Because of this, if you ever experience shortness of breath on exertion, difficulty taking a deep breath, chest pain or a dry cough following dry needling, you should report to an emergency room and tell them that you have been dry needled over the thorax.  [name] PalmJulie Tollie [name], PT 06/19/16 1:46 PM;l [facility] [address] [place], [place], [address] Phone: [phone]   Fax:  [phone]

## 2016-06-21 ENCOUNTER — Encounter: Payer: Self-pay | Admitting: Physical Therapy

## 2016-06-21 ENCOUNTER — Ambulatory Visit: Payer: Medicare Other | Admitting: Physical Therapy

## 2016-06-21 DIAGNOSIS — M545 Low back pain: Secondary | ICD-10-CM | POA: Diagnosis not present

## 2016-06-21 DIAGNOSIS — G8929 Other chronic pain: Secondary | ICD-10-CM

## 2016-06-21 DIAGNOSIS — R293 Abnormal posture: Secondary | ICD-10-CM

## 2016-06-21 DIAGNOSIS — M25561 Pain in right knee: Secondary | ICD-10-CM

## 2016-06-21 NOTE — Patient Instructions (Signed)
Brushing Teeth    Place one foot on ledge and one hand on counter. Bend other knee slightly to keep back straight.  Copyright  VHI. All rights reserved.  Refrigerator   Squat with knees apart to reach lower shelves and drawers.   Copyright  VHI. All rights reserved.  Laundry Basket   Squat down and hold basket close to stand. Use leg muscles to do the work.   Copyright  VHI. All rights reserved.  Housework - Vacuuming   Hold the vacuum with arm held at side. Step back and forth to move it, keeping head up. Avoid twisting.   Copyright  VHI. All rights reserved.  Housework - Wiping   Position yourself as close as possible to reach work surface. Avoid straining your back.     Sleeping on Side   Place pillow between knees. Use cervical support under neck and a roll around waist as needed.   Copyright  VHI. All rights reserved.  Log Roll   Lying on back, bend left knee and place left arm across chest. Roll all in one movement to the right. Reverse to roll to the left. Always move as one unit.   Copyright  VHI. All rights reserved.  Stand to Sit / Sit to Stand   To sit: Bend knees to lower self onto front edge of chair, then scoot back on seat. To stand: Reverse sequence by placing one foot forward, and scoot to front of seat. Use rocking motion to stand up.  Copyright  VHI. All rights reserved.  Posture - Standing   Good posture is important. Avoid slouching and forward head thrust. Maintain curve in low back and align ears over shoul- ders, hips over ankles.   Copyright  VHI. All rights reserved.  Posture - Sitting   Sit upright, head facing forward. Try using a roll to support lower back. Keep shoulders relaxed, and avoid rounded back. Keep hips level with knees. Avoid crossing legs for long periods.   

## 2016-06-21 NOTE — Therapy (Addendum)
[facility] [place], [place], [address] Phone: [phone]   Fax:  [phone]  Physical Therapy Treatment  Patient Details  Name: [name] [name] MRN: [id number] Date of Birth: Jun 23, 1932 Referring Provider: Raynald Kemp. PA-C  Encounter Date: 06/21/2016      PT End of Session - 06/21/16 1325    Visit Number 5   Number of Visits 12   Date for PT Re-Evaluation 07/28/16   PT Start Time 1314   PT Stop Time 1409   PT Time Calculation (min) 55 min   Activity Tolerance Patient tolerated treatment well   Behavior During Therapy [facility] for tasks assessed/performed      Past Medical History:  Diagnosis Date  . ANXIETY   . Arteriosclerotic cardiovascular disease (ASCVD)    Cath in 9/99-70% mid LAD with diffuse distal disease, 70% T1, 60% mid circumflex, 50% mid RCA, normal ejection fraction; negative stress nuclear in 07/2008  . Carpal tunnel syndrome   . Cerebrovascular disease    carotid bruits; no focal disease in 1999, 2002 and 2006  . Closed left fibular fracture 11/30/11  . COPD   . Diabetes mellitus    Insulin treatment  . Diastolic CHF, chronic (HCC)    Normal EF  . Gastroesophageal reflux disease    With hiatal hernia; irritable bowel syndrome; H. pylori-treated; Colonoscopy 2008: non-specific colitis, IH, Diverticulosis, Rectal ulcer secondary to ASA  . Hyperlipidemia   . Hypertension   . Hypothyroidism   . OSTEOARTHRITIS, KNEES, BILATERAL    Bilateral TKA  . Pacemaker   . Peripheral neuropathy ([place])   . Sinoatrial node dysfunction ([place]) 2003   Medtronic dual-chamber device  . Sleep apnea   . VITAMIN B12 DEFICIENCY     Past Surgical History:  Procedure Laterality Date  . ABDOMINAL HYSTERECTOMY    . BLADDER REPAIR    . CARDIAC PACEMAKER PLACEMENT  2003   Medtronic, dual-chamber  . CARPAL TUNNEL RELEASE    . CHOLECYSTECTOMY    . COLONOSCOPY  2008  . HEMORRHOID SURGERY    . NASAL FRACTURE  SURGERY    . THYROIDECTOMY  1980   Goiter  . TOTAL KNEE ARTHROPLASTY     Bilateral    There were no vitals filed for this visit.      Subjective Assessment - 06/21/16 1321    Subjective Patient reported increased pain in back from cleaning house   Patient Stated Goals Decrease my pain and get stronger.   Currently in Pain? Yes   Pain Score 8    Pain Location Back  down to hip   Pain Orientation Right   Pain Descriptors / Indicators Aching;Sore   Pain Type Chronic pain   Pain Onset More than a month ago   Pain Frequency Constant   Aggravating Factors  increased activity   Pain Relieving Factors at rest   Pain Score 4   Pain Location Knee   Pain Orientation Right   Pain Descriptors / Indicators Aching   Pain Type Chronic pain   Pain Onset More than a month ago   Pain Frequency Constant   Aggravating Factors  increased activity   Pain Relieving Factors at rest                         [facility] Adult PT Treatment/Exercise - 06/21/16 0001      Lumbar Exercises: Supine   Ab Set 20 reps;3 seconds   Glut  Set 20 reps;3 seconds   Clam 3 seconds  2x10   Bent Knee Raise 3 seconds  2x10     Knee/Hip Exercises: Aerobic   Nustep L3 seat x 10 mins UE/LE activity with focus on posture     Knee/Hip Exercises: Supine   Short Arc Quad Sets Strengthening;Right;3 sets;10 reps   Straight Leg Raises Limitations  unable     Moist Heat Therapy   Number Minutes Moist Heat 15 Minutes   Moist Heat Location Hip;Lumbar Spine     Manual Therapy   Manual Therapy Myofascial release;Soft tissue mobilization   Soft tissue mobilization to right glut, piriformis area   Myofascial Release low back paraspinals                 PT Education - 06/21/16 1348    Education provided Yes   Education Details HEP posture/ADL's   Person(s) Educated Patient   Methods Explanation;Demonstration;Handout   Comprehension Verbalized understanding;Returned demonstration           PT Short Term Goals - 05/29/16 1729      PT SHORT TERM GOAL #1   Title STG's=LTG's.           PT Long Term Goals - 06/14/16 1341      PT LONG TERM GOAL #1   Title Independent with a HEP.   Time 8   Period Weeks   Status On-going     PT LONG TERM GOAL #2   Title Perform ADL's with with pain not > 3/10.   Time 8   Period Weeks   Status On-going     PT LONG TERM GOAL #3   Title Achieve lumbar extension to 10 degrees to decrease joint reaction forces while walking and performing functional activites.   Time 8   Period Weeks   Status On-going               Plan - 06/21/16 1341    Clinical Impression Statement Patient tolerated treatment well yet has reported increased after vacuum and mopping the floor. Patient unable to tolerate standing exercises due to pain today. Patient tolerated sitting and supine exercises with minimal pain. Patient unable to meet any further goals due to pain deficits. HEP given for ADL's posture.   Rehab Potential Good   PT Frequency 2x / week   PT Duration 8 weeks   PT Treatment/Interventions Moist Heat;Therapeutic activities;Therapeutic exercise;Neuromuscular re-education;Patient/family education;Manual techniques;Dry needling   PT Next Visit Plan STW/M to right SIJ; Right LE strengthening; Nustep.  (DN if approved, look for signature on recert)   Consulted and Agree with Plan of Care Patient      Patient will benefit from skilled therapeutic intervention in order to improve the following deficits and impairments:  Pain, Decreased activity tolerance, Decreased strength, Decreased range of motion, Postural dysfunction  Visit Diagnosis: Chronic right-sided low back pain, with sciatica presence unspecified  Chronic pain of right knee  Abnormal posture     Problem List Patient Active Problem List   Diagnosis Date Noted  . PVD (peripheral vascular disease) ([place]) 01/27/2016  . Diabetes mellitus, type 2 ([place]) 07/19/2014  . Skin  moniliasis 05/31/2014  . OAB (overactive bladder) 12/23/2012  . Insomnia 04/06/2011  . Sinoatrial node dysfunction (HCC)   . Gastroesophageal reflux disease   . Type II diabetes mellitus with peripheral autonomic neuropathy (HCC)   . GAD (generalized anxiety disorder) 12/30/2009  . Peripheral neuropathy ([place]) 12/26/2009  . Vitamin B 12 deficiency 10/11/2009  .  Morbid obesity ([place]) 09/16/2009  . Arteriosclerotic cardiovascular disease (ASCVD) 09/15/2009  . CEREBROVASCULAR DISEASE 09/15/2009  . Hyperlipidemia 08/29/2009  . Hypertension 08/29/2009  . Osteoarthrosis, unspecified whether generalized or localized, involving lower leg 08/29/2009  . COLONIC POLYPS, ADENOMATOUS, HX OF 02/22/2009  . Hypothyroid 02/17/2009  . Chronic obstructive pulmonary disease ([place]) 02/17/2009  . Chronic diastolic heart failure ([place]) 12/15/2008  . PPM-Medtronic 12/15/2008    Phillips Climes, PTA 06/21/2016, 2:09 PM  [facility] [place], [place], [address] Phone: [phone]   Fax:  [phone]  Name: [name] [name] MRN: [id number] Date of Birth: March 24, 1933  PHYSICAL THERAPY DISCHARGE SUMMARY  Visits from Start of Care: 5.  Current functional level related to goals / functional outcomes: See above.   Remaining deficits: Pt did not return.   Education / Equipment: HEP. Plan: Patient agrees to discharge.  Patient goals were not met. Patient is being discharged due to not returning since the last visit.  ?????         Mali Applegate MPT

## 2016-06-22 ENCOUNTER — Other Ambulatory Visit: Payer: Self-pay | Admitting: Pharmacist

## 2016-06-22 ENCOUNTER — Ambulatory Visit: Payer: Self-pay | Admitting: Family

## 2016-06-25 ENCOUNTER — Encounter: Payer: Medicare Other | Admitting: Physical Therapy

## 2016-06-25 ENCOUNTER — Encounter: Payer: Self-pay | Admitting: Family

## 2016-06-26 ENCOUNTER — Encounter: Payer: Medicare Other | Admitting: Physical Therapy

## 2016-06-27 ENCOUNTER — Encounter: Payer: Medicare Other | Admitting: Physical Therapy

## 2016-06-28 ENCOUNTER — Encounter: Payer: Medicare Other | Admitting: *Deleted

## 2016-06-29 ENCOUNTER — Encounter: Payer: Medicare Other | Admitting: Physical Therapy

## 2016-06-29 ENCOUNTER — Other Ambulatory Visit: Payer: Self-pay | Admitting: Family

## 2016-07-03 ENCOUNTER — Telehealth: Payer: Self-pay | Admitting: Family

## 2016-07-03 NOTE — Telephone Encounter (Signed)
LM for pt to call - so we can get more detail of the pain she is having

## 2016-07-04 ENCOUNTER — Ambulatory Visit: Payer: Medicare Other | Admitting: Physical Therapy

## 2016-07-05 ENCOUNTER — Encounter: Payer: Medicare Other | Admitting: Physical Therapy

## 2016-07-09 ENCOUNTER — Ambulatory Visit (INDEPENDENT_AMBULATORY_CARE_PROVIDER_SITE_OTHER): Payer: Medicare Other | Admitting: Family

## 2016-07-09 ENCOUNTER — Encounter: Payer: Self-pay | Admitting: Family

## 2016-07-09 VITALS — BP 104/64 | HR 59 | Temp 96.9°F | Ht 70.0 in | Wt 219.0 lb

## 2016-07-09 DIAGNOSIS — E039 Hypothyroidism, unspecified: Secondary | ICD-10-CM

## 2016-07-09 DIAGNOSIS — IMO0002 Reserved for concepts with insufficient information to code with codable children: Secondary | ICD-10-CM

## 2016-07-09 DIAGNOSIS — G8929 Other chronic pain: Secondary | ICD-10-CM

## 2016-07-09 DIAGNOSIS — I739 Peripheral vascular disease, unspecified: Secondary | ICD-10-CM | POA: Diagnosis not present

## 2016-07-09 DIAGNOSIS — K219 Gastro-esophageal reflux disease without esophagitis: Secondary | ICD-10-CM | POA: Diagnosis not present

## 2016-07-09 DIAGNOSIS — E785 Hyperlipidemia, unspecified: Secondary | ICD-10-CM

## 2016-07-09 DIAGNOSIS — J449 Chronic obstructive pulmonary disease, unspecified: Secondary | ICD-10-CM | POA: Diagnosis not present

## 2016-07-09 DIAGNOSIS — E1165 Type 2 diabetes mellitus with hyperglycemia: Secondary | ICD-10-CM | POA: Diagnosis not present

## 2016-07-09 DIAGNOSIS — I10 Essential (primary) hypertension: Secondary | ICD-10-CM | POA: Diagnosis not present

## 2016-07-09 DIAGNOSIS — M1710 Unilateral primary osteoarthritis, unspecified knee: Secondary | ICD-10-CM

## 2016-07-09 DIAGNOSIS — E1143 Type 2 diabetes mellitus with diabetic autonomic (poly)neuropathy: Secondary | ICD-10-CM | POA: Diagnosis not present

## 2016-07-09 DIAGNOSIS — E538 Deficiency of other specified B group vitamins: Secondary | ICD-10-CM

## 2016-07-09 DIAGNOSIS — M25561 Pain in right knee: Secondary | ICD-10-CM

## 2016-07-09 DIAGNOSIS — N3281 Overactive bladder: Secondary | ICD-10-CM | POA: Diagnosis not present

## 2016-07-09 DIAGNOSIS — I5032 Chronic diastolic (congestive) heart failure: Secondary | ICD-10-CM

## 2016-07-09 DIAGNOSIS — G6289 Other specified polyneuropathies: Secondary | ICD-10-CM

## 2016-07-09 DIAGNOSIS — F411 Generalized anxiety disorder: Secondary | ICD-10-CM

## 2016-07-09 DIAGNOSIS — Z794 Long term (current) use of insulin: Secondary | ICD-10-CM

## 2016-07-09 DIAGNOSIS — G4709 Other insomnia: Secondary | ICD-10-CM

## 2016-07-09 LAB — BAYER DCA HB A1C WAIVED: HB A1C (BAYER DCA - WAIVED): 7.1 % — ABNORMAL HIGH (ref <7.0)

## 2016-07-09 MED ORDER — ALPRAZOLAM 0.5 MG PO TABS: ORAL_TABLET | ORAL | 3 refills | Status: DC

## 2016-07-09 MED ORDER — METOPROLOL SUCCINATE ER 25 MG PO TB24: 25.0000 mg | ORAL_TABLET | Freq: Every day | ORAL | 3 refills | Status: DC

## 2016-07-09 MED ORDER — CYCLOBENZAPRINE HCL 10 MG PO TABS: 10.0000 mg | ORAL_TABLET | Freq: Three times a day (TID) | ORAL | 1 refills | Status: DC | PRN

## 2016-07-09 MED ORDER — HYDROCODONE-ACETAMINOPHEN 5-325 MG PO TABS: 1.0000 | ORAL_TABLET | Freq: Four times a day (QID) | ORAL | 0 refills | Status: DC | PRN

## 2016-07-09 MED ORDER — HYDROCODONE-ACETAMINOPHEN 5-325 MG PO TABS: ORAL_TABLET | ORAL | 0 refills | Status: DC

## 2016-07-09 NOTE — Addendum Note (Signed)
Addended by: Jannifer RodneyHAWKS, CHRISTY A on: 07/09/2016 12:12 PM   Modules accepted: Orders

## 2016-07-09 NOTE — Patient Instructions (Signed)
Stress and Stress Management Stress is a normal reaction to life events. It is what you feel when life demands more than you are used to or more than you can handle. Some stress can be useful. For example, the stress reaction can help you catch the last bus of the day, study for a test, or meet a deadline at work. But stress that occurs too often or for too long can cause problems. It can affect your emotional health and interfere with relationships and normal daily activities. Too much stress can weaken your immune system and increase your risk for physical illness. If you already have a medical problem, stress can make it worse. What are the causes? All sorts of life events may cause stress. An event that causes stress for one person may not be stressful for another person. Major life events commonly cause stress. These may be positive or negative. Examples include losing your job, moving into a new home, getting married, having a baby, or losing a loved one. Less obvious life events may also cause stress, especially if they occur day after day or in combination. Examples include working long hours, driving in traffic, caring for children, being in debt, or being in a difficult relationship. What are the signs or symptoms? Stress may cause emotional symptoms including, the following:  Anxiety. This is feeling worried, afraid, on edge, overwhelmed, or out of control.  Anger. This is feeling irritated or impatient.  Depression. This is feeling sad, down, helpless, or guilty.  Difficulty focusing, remembering, or making decisions. Stress may cause physical symptoms, including the following:  Aches and pains. These may affect your head, neck, back, stomach, or other areas of your body.  Tight muscles or clenched jaw.  Low energy or trouble sleeping. Stress may cause unhealthy behaviors, including the following:  Eating to feel better (overeating) or skipping meals.  Sleeping too little, too  much, or both.  Working too much or putting off tasks (procrastination).  Smoking, drinking alcohol, or using drugs to feel better. How is this diagnosed? Stress is diagnosed through an assessment by your health care provider. Your health care provider will ask questions about your symptoms and any stressful life events.Your health care provider will also ask about your medical history and may order blood tests or other tests. Certain medical conditions and medicine can cause physical symptoms similar to stress. Mental illness can cause emotional symptoms and unhealthy behaviors similar to stress. Your health care provider may refer you to a mental health professional for further evaluation. How is this treated? Stress management is the recommended treatment for stress.The goals of stress management are reducing stressful life events and coping with stress in healthy ways. Techniques for reducing stressful life events include the following:  Stress identification. Self-monitor for stress and identify what causes stress for you. These skills may help you to avoid some stressful events.  Time management. Set your priorities, keep a calendar of events, and learn to say "no." These tools can help you avoid making too many commitments. Techniques for coping with stress include the following:  Rethinking the problem. Try to think realistically about stressful events rather than ignoring them or overreacting. Try to find the positives in a stressful situation rather than focusing on the negatives.  Exercise. Physical exercise can release both physical and emotional tension. The key is to find a form of exercise you enjoy and do it regularly.  Relaxation techniques. These relax the body and mind. Examples include yoga,  meditation, tai chi, biofeedback, deep breathing, progressive muscle relaxation, listening to music, being out in nature, journaling, and other hobbies. Again, the key is to find one or  more that you enjoy and can do regularly.  Healthy lifestyle. Eat a balanced diet, get plenty of sleep, and do not smoke. Avoid using alcohol or drugs to relax.  Strong support network. Spend time with family, friends, or other people you enjoy being around.Express your feelings and talk things over with someone you trust. Counseling or talktherapy with a mental health professional may be helpful if you are having difficulty managing stress on your own. Medicine is typically not recommended for the treatment of stress.Talk to your health care provider if you think you need medicine for symptoms of stress. Follow these instructions at home:  Keep all follow-up visits as directed by your health care provider.  Take all medicines as directed by your health care provider. Contact a health care provider if:  Your symptoms get worse or you start having new symptoms.  You feel overwhelmed by your problems and can no longer manage them on your own. Get help right away if:  You feel like hurting yourself or someone else. This information is not intended to replace advice given to you by your health care provider. Make sure you discuss any questions you have with your health care provider. Document Released: 11/21/2000 Document Revised: 11/03/2015 Document Reviewed: 01/20/2013 Elsevier Interactive Patient Education  2017 [employer].

## 2016-07-09 NOTE — Progress Notes (Signed)
Subjective:    Patient ID: [name] [name], female    DOB: [birth year], 83 y.o.   MRN: [id number]  Pt presents to the office today for chronic follow up. Pt has pacemaker for sinoatrial node dysfunction, CHF, PVD,  and is followed by her Cardiologist every 6 months. PT states her daughter and granddaughter have passed away since our last visit. PT states she has a lot of anxiety from this.  Diabetes  She presents for her follow-up diabetic visit. She has type 2 diabetes mellitus. Her disease course has been fluctuating. Hypoglycemia symptoms include nervousness/anxiousness. Pertinent negatives for hypoglycemia include no confusion, dizziness, headaches or hunger. Associated symptoms include foot paresthesias. Pertinent negatives for diabetes include no blurred vision and no visual change. Pertinent negatives for hypoglycemia complications include no blackouts and no hospitalization. Symptoms are worsening. Diabetic complications include peripheral neuropathy. Pertinent negatives for diabetic complications include no CVA, heart disease or nephropathy. Risk factors for coronary artery disease include diabetes mellitus, dyslipidemia, hypertension, obesity, post-menopausal and family history. Current diabetic treatment includes insulin injections and oral agent (dual therapy). She is following a generally unhealthy diet. Her breakfast blood glucose range is generally 130-140 mg/dl. An ACE inhibitor/angiotensin II receptor blocker is being taken. Eye exam is not current.  Hypertension  This is a chronic problem. The current episode started more than 1 year ago. The problem has been resolved since onset. The problem is controlled. Associated symptoms include anxiety and peripheral edema. Pertinent negatives include no blurred vision, headaches or palpitations. Risk factors for coronary artery disease include diabetes mellitus, dyslipidemia, obesity and post-menopausal state. Past treatments include ACE inhibitors,  beta blockers and calcium channel blockers. The current treatment provides significant improvement. Hypertensive end-organ damage includes CAD/MI. There is no history of kidney disease, CVA or heart failure. Identifiable causes of hypertension include a thyroid problem. There is no history of sleep apnea.  Hyperlipidemia  This is a chronic problem. The current episode started more than 1 year ago. The problem is controlled. Recent lipid tests were reviewed and are normal. Exacerbating diseases include hypothyroidism and obesity. Pertinent negatives include no myalgias. Current antihyperlipidemic treatment includes statins. The current treatment provides significant improvement of lipids. Risk factors for coronary artery disease include diabetes mellitus, dyslipidemia, hypertension, obesity and post-menopausal.  Anxiety  Presents for follow-up visit. Onset was more than 5 years ago. The problem has been waxing and waning. Symptoms include depressed mood, excessive worry, insomnia and nervous/anxious behavior. Patient reports no confusion, dizziness, dry mouth, irritability, nausea or palpitations. Symptoms occur most days. The symptoms are aggravated by family issues. The quality of sleep is fair. Nighttime awakenings: occasional.   Her past medical history is significant for anxiety/panic attacks and CAD. There is no history of depression. Past treatments include benzodiazephines.  Gastroesophageal Reflux  She complains of a hoarse voice. She reports no belching, no heartburn or no nausea. This is a chronic problem. The current episode started more than 1 year ago. The problem occurs rarely. The problem has been resolved. The symptoms are aggravated by certain foods and lying down. Risk factors include obesity. She has tried a PPI for the symptoms. The treatment provided significant relief.  Thyroid Problem  Presents for follow-up visit. Symptoms include anxiety, depressed mood and hoarse voice. Patient  reports no constipation, palpitations or visual change. The symptoms have been stable. Past treatments include levothyroxine. The treatment provided significant relief. Her past medical history is significant for hyperlipidemia. There is no history of heart failure.  Arthritis  Presents for follow-up visit. She complains of stiffness and joint swelling. Affected locations include the right knee and left knee. Her pain is at a severity of 8/10. Pertinent negatives include no dry eyes or dry mouth.  OAB Pt currently taking myrrbetriq 25 daily. Pt states she continues to get up every hour to "pee'.  Peripheral Neuropathy Pt currently taking gabapentin 300 mg TID. PT states she continues to have numbness in bilateral feet.  COPD Pt currently taking albuterol daily, but is unsure if she is taking the Symbicort BID or as needed.   Pain assessment: Pain location- Neck and back Pain on scale of 1-10- 8 Frequency- Constantly What increases pain-Standing and walking What makes pain Better-Rest and Norco Effects on ADL -Hard to get out to appts  Current medications- Norco 5-325 mg every 6 hours as needed #60  Effectiveness of current meds-Table  Pill count performed-No Urine drug screen- No Was the [place] reviewed- Yes  If yes were their any concerning findings? -Has only received controlled medication from me.    Review of Systems  Constitutional: Negative for irritability.  HENT: Positive for hoarse voice.   Eyes: Negative.  Negative for blurred vision.  Cardiovascular: Negative.  Negative for palpitations.  Gastrointestinal: Negative.  Negative for constipation, heartburn and nausea.  Endocrine: Negative.   Genitourinary: Negative.   Musculoskeletal: Positive for arthralgias, arthritis, joint swelling and stiffness. Negative for myalgias.  Neurological: Negative.  Negative for dizziness and headaches.  Hematological: Negative.   Psychiatric/Behavioral: Negative for confusion. The patient  is nervous/anxious and has insomnia.   All other systems reviewed and are negative.      Objective:   Physical Exam  Constitutional: She is oriented to person, place, and time. She appears well-developed and well-nourished. No distress.  HENT:  Head: Normocephalic and atraumatic.  Eyes: Pupils are equal, round, and reactive to light.  Neck: Normal range of motion. Neck supple. No thyromegaly present.  Cardiovascular: Normal rate, regular rhythm, normal heart sounds and intact distal pulses.   No murmur heard. Pulmonary/Chest: Effort normal and breath sounds normal. No respiratory distress. She has no wheezes.  Abdominal: Soft. Bowel sounds are normal. She exhibits no distension. There is no tenderness.  Musculoskeletal: She exhibits no edema or tenderness.  Neurological: She is alert and oriented to person, place, and time. She has normal reflexes. No cranial nerve deficit.  Skin: Skin is warm and dry. Ecchymosis noted. No rash noted. No erythema.      Psychiatric: She has a normal mood and affect. Her behavior is normal. Judgment and thought content normal.  Vitals reviewed.  BP 104/64   Pulse (!) 59   Temp (!) 96.9 F (36.1 C) (Oral)   Ht '5\' 10"'  (1.778 m)   Wt 219 lb (99.3 kg)   BMI 31.42 kg/m      Assessment & Plan:  1. Essential hypertension -Pt's metoprolol decreased to 25 mg from 50 mg RTO in 2 weeks to recheck  - CMP14+EGFR - metoprolol succinate (TOPROL-XL) 25 MG 24 hr tablet; Take 1 tablet (25 mg total) by mouth daily.  Dispense: 90 tablet; Refill: 3  2. Chronic diastolic heart failure (HCC) - CMP14+EGFR  3. PVD (peripheral vascular disease) (HCC) - CMP14+EGFR  4. Chronic obstructive pulmonary disease, unspecified COPD type ([place]) - CMP14+EGFR  5. Gastroesophageal reflux disease, esophagitis presence not specified - CMP14+EGFR  6. Type II diabetes mellitus with peripheral autonomic neuropathy (HCC)  - CMP14+EGFR - Bayer DCA Hb A1c Waived  7.  Hypothyroidism, unspecified type - CMP14+EGFR - Thyroid Panel With TSH  8. Type 2 diabetes mellitus with hyperglycemia, with long-term current use of insulin (HCC)  - CMP14+EGFR  9. Other polyneuropathy ([place]) - HYDROcodone-acetaminophen (NORCO/VICODIN) 5-325 MG tablet; Take 1 tablet by mouth every 6 (six) hours as needed for moderate pain.  Dispense: 60 tablet; Refill: 0 - HYDROcodone-acetaminophen (NORCO/VICODIN) 5-325 MG tablet; Take 1 tablet by mouth every 6 (six) hours as needed for moderate pain.  Dispense: 60 tablet; Refill: 0 - HYDROcodone-acetaminophen (NORCO/VICODIN) 5-325 MG tablet; TAKE (1) TABLET EVERY SIX HOURS AS NEEDED FOR MODERATE PAIN  Dispense: 60 tablet; Refill: 0 - CMP14+EGFR - ToxASSURE Select 13 (MW), Urine  10. Osteoarthrosis involving lower leg  - CMP14+EGFR  11. OAB (overactive bladder)  - CMP14+EGFR  12. Vitamin B 12 deficiency - CMP14+EGFR  13. Morbid obesity ([place])  - CMP14+EGFR  14. Other insomnia  - CMP14+EGFR  15. Hyperlipidemia, unspecified hyperlipidemia type - CMP14+EGFR  16. GAD (generalized anxiety disorder)  - CMP14+EGFR - ALPRAZolam (XANAX) 0.5 MG tablet; TAKE 1 TABLET TWICE DAILY AS NEEDED FOR ANXIETY  Dispense: 60 tablet; Refill: 3  17. Chronic pain of right knee  - HYDROcodone-acetaminophen (NORCO/VICODIN) 5-325 MG tablet; TAKE (1) TABLET EVERY SIX HOURS AS NEEDED FOR MODERATE PAIN  Dispense: 60 tablet; Refill: 0 - CMP14+EGFR - ToxASSURE Select 13 (MW), Urine   Continue all meds Labs pending Health Maintenance reviewed Diet and exercise encouraged RTO 2 weeks to recheck HTN after decreasing metoprolol   Evelina Dun, FNP

## 2016-07-10 ENCOUNTER — Other Ambulatory Visit: Payer: Self-pay | Admitting: Family

## 2016-07-10 LAB — CMP14+EGFR
A/G RATIO: 1.5 (ref 1.2–2.2)
ALBUMIN: 3.8 g/dL (ref 3.5–4.7)
ALT: 16 IU/L (ref 0–32)
AST: 19 IU/L (ref 0–40)
Alkaline Phosphatase: 76 IU/L (ref 39–117)
BUN / CREAT RATIO: 17 (ref 12–28)
BUN: 17 mg/dL (ref 8–27)
Bilirubin Total: 0.2 mg/dL (ref 0.0–1.2)
CALCIUM: 9.9 mg/dL (ref 8.7–10.3)
CO2: 21 mmol/L (ref 18–29)
CREATININE: 1.03 mg/dL — AB (ref 0.57–1.00)
Chloride: 99 mmol/L (ref 96–106)
GFR calc Af Amer: 59 mL/min/{1.73_m2} — ABNORMAL LOW (ref >59)
GFR, EST NON AFRICAN AMERICAN: 51 mL/min/{1.73_m2} — AB (ref >59)
GLOBULIN, TOTAL: 2.5 g/dL (ref 1.5–4.5)
Glucose: 161 mg/dL — ABNORMAL HIGH (ref 65–99)
Potassium: 5 mmol/L (ref 3.5–5.2)
SODIUM: 138 mmol/L (ref 134–144)
Total Protein: 6.3 g/dL (ref 6.0–8.5)

## 2016-07-10 LAB — THYROID PANEL WITH TSH
Free Thyroxine Index: 2 (ref 1.2–4.9)
T3 UPTAKE RATIO: 28 % (ref 24–39)
T4 TOTAL: 7.3 ug/dL (ref 4.5–12.0)
TSH: 7.92 u[IU]/mL — ABNORMAL HIGH (ref 0.450–4.500)

## 2016-07-12 ENCOUNTER — Telehealth: Payer: Self-pay | Admitting: Family

## 2016-07-12 ENCOUNTER — Other Ambulatory Visit: Payer: Self-pay | Admitting: Family

## 2016-07-12 DIAGNOSIS — B3789 Other sites of candidiasis: Secondary | ICD-10-CM

## 2016-07-12 NOTE — Telephone Encounter (Signed)
Labs mailed to pt

## 2016-07-14 LAB — TOXASSURE SELECT 13 (MW), URINE

## 2016-07-23 ENCOUNTER — Other Ambulatory Visit: Payer: Self-pay | Admitting: *Deleted

## 2016-07-23 ENCOUNTER — Other Ambulatory Visit: Payer: Self-pay | Admitting: Family

## 2016-07-23 DIAGNOSIS — B3789 Other sites of candidiasis: Secondary | ICD-10-CM

## 2016-07-23 MED ORDER — MELOXICAM 15 MG PO TABS: 15.0000 mg | ORAL_TABLET | Freq: Every day | ORAL | 0 refills | Status: DC

## 2016-07-23 NOTE — Telephone Encounter (Signed)
Refill sent to pharmacy.   

## 2016-07-23 NOTE — Telephone Encounter (Signed)
Go ahead and send one month of meloxicam 15 mg with no refills and have her follow up and discuss it with Christy in the future.

## 2016-07-25 DIAGNOSIS — Z471 Aftercare following joint replacement surgery: Secondary | ICD-10-CM | POA: Diagnosis not present

## 2016-07-25 DIAGNOSIS — Z96651 Presence of right artificial knee joint: Secondary | ICD-10-CM | POA: Diagnosis not present

## 2016-08-22 ENCOUNTER — Other Ambulatory Visit: Payer: Self-pay | Admitting: Family Medicine

## 2016-08-30 ENCOUNTER — Other Ambulatory Visit: Payer: Self-pay | Admitting: Family

## 2016-08-30 DIAGNOSIS — F411 Generalized anxiety disorder: Secondary | ICD-10-CM

## 2016-09-05 ENCOUNTER — Other Ambulatory Visit: Payer: Self-pay | Admitting: Family

## 2016-09-18 ENCOUNTER — Other Ambulatory Visit: Payer: Self-pay | Admitting: Family

## 2016-09-28 ENCOUNTER — Other Ambulatory Visit: Payer: Self-pay | Admitting: Family

## 2016-10-02 ENCOUNTER — Ambulatory Visit: Payer: Medicare Other | Admitting: Family

## 2016-10-11 ENCOUNTER — Ambulatory Visit: Payer: Medicare Other | Admitting: Family

## 2016-10-12 ENCOUNTER — Encounter: Payer: Self-pay | Admitting: Family

## 2016-10-12 ENCOUNTER — Telehealth: Payer: Self-pay | Admitting: Family

## 2016-10-12 NOTE — Telephone Encounter (Signed)
Left message for patient to call to reschedule.

## 2016-10-15 ENCOUNTER — Ambulatory Visit (INDEPENDENT_AMBULATORY_CARE_PROVIDER_SITE_OTHER): Payer: Medicare Other | Admitting: Physician Assistant

## 2016-10-15 ENCOUNTER — Ambulatory Visit (INDEPENDENT_AMBULATORY_CARE_PROVIDER_SITE_OTHER): Payer: Medicare Other

## 2016-10-15 ENCOUNTER — Encounter: Payer: Self-pay | Admitting: Physician Assistant

## 2016-10-15 VITALS — BP 131/65 | HR 60 | Temp 97.4°F | Ht 70.0 in | Wt 230.0 lb

## 2016-10-15 DIAGNOSIS — S79911A Unspecified injury of right hip, initial encounter: Secondary | ICD-10-CM

## 2016-10-15 DIAGNOSIS — M25551 Pain in right hip: Secondary | ICD-10-CM

## 2016-10-15 NOTE — Patient Instructions (Signed)
Hip Pain The hip is the joint between the upper legs and the lower pelvis. The bones, cartilage, tendons, and muscles of your hip joint support your body and allow you to move around. Hip pain can range from a minor ache to severe pain in one or both of your hips. The pain may be felt on the inside of the hip joint near the groin, or the outside near the buttocks and upper thigh. You may also have swelling or stiffness. Follow these instructions at home: Managing pain, stiffness, and swelling   If directed, apply ice to the injured area.  Put ice in a plastic bag.  Place a towel between your skin and the bag.  Leave the ice on for 20 minutes, 2-3 times a day  Sleep with a pillow between your legs on your most comfortable side.  Avoid any activities that cause pain. General instructions   Take over-the-counter and prescription medicines only as told by your health care provider.  Do any exercises as told by your health care provider.  Record the following:  How often you have hip pain.  The location of your pain.  What the pain feels like.  What makes the pain worse.  Keep all follow-up visits as told by your health care provider. This is important. Contact a health care provider if:  You cannot put weight on your leg.  Your pain or swelling continues or gets worse after one week.  It gets harder to walk.  You have a fever. Get help right away if:  You fall.  You have a sudden increase in pain and swelling in your hip.  Your hip is red or swollen or very tender to touch. Summary  Hip pain can range from a minor ache to severe pain in one or both of your hips.  The pain may be felt on the inside of the hip joint near the groin, or the outside near the buttocks and upper thigh.  Avoid any activities that cause pain.  Record how often you have hip pain, the location of the pain, what makes it worse and what it feels like. This information is not intended to  replace advice given to you by your health care provider. Make sure you discuss any questions you have with your health care provider. Document Released: 11/15/2009 Document Revised: 04/30/2016 Document Reviewed: 04/30/2016 Elsevier Interactive Patient Education  2017 Elsevier Inc.  

## 2016-10-15 NOTE — Progress Notes (Signed)
BP 131/65   Pulse 60   Temp 97.4 F (36.3 C) (Oral)   Ht 5\' 10"  (1.778 m)   Wt 230 lb (104.3 kg)   BMI 33.00 kg/m    Subjective:    Patient ID: [name] [name], female    DOB: 1933-05-25, 83 y.o.   MRN: [id number]  HPI: [name] [name] is a 83 y.o. female presenting on 10/15/2016 for hip/leg pain (Right )  October 01, 2016 the patient was in her bed and while reaching to pick up something she fell and landed in her right hip. Has had pain and soreness in the hip.  Tried topical meds and has oral pain meds. She has been having weakness in the leg and cannot support herself on this side. She is in a wheelchair here in the office. She has had a lift chair in the past, but it was bought by a son. She is interested in getting another one. Told her daughter that they should contact [employer], [employer] or [employer] about getting one. She will need a face-to-face appointment with her PCP for the paperwork related to this.  Relevant past medical, surgical, family and social history reviewed and updated as indicated. Allergies and medications reviewed and updated.  Past Medical History:  Diagnosis Date  . ANXIETY   . Arteriosclerotic cardiovascular disease (ASCVD)    Cath in 9/99-70% mid LAD with diffuse distal disease, 70% T1, 60% mid circumflex, 50% mid RCA, normal ejection fraction; negative stress nuclear in 07/2008  . Carpal tunnel syndrome   . Cerebrovascular disease    carotid bruits; no focal disease in 1999, 2002 and 2006  . Closed left fibular fracture 11/30/11  . COPD   . Diabetes mellitus    Insulin treatment  . Diastolic CHF, chronic (HCC)    Normal EF  . Gastroesophageal reflux disease    With hiatal hernia; irritable bowel syndrome; H. pylori-treated; Colonoscopy 2008: non-specific colitis, IH, Diverticulosis, Rectal ulcer secondary to ASA  . Hyperlipidemia   . Hypertension   . Hypothyroidism   . OSTEOARTHRITIS, KNEES, BILATERAL    Bilateral TKA  .  Pacemaker   . Peripheral neuropathy   . Sinoatrial node dysfunction (HCC) 2003   Medtronic dual-chamber device  . Sleep apnea   . VITAMIN B12 DEFICIENCY     Past Surgical History:  Procedure Laterality Date  . ABDOMINAL HYSTERECTOMY    . BLADDER REPAIR    . CARDIAC PACEMAKER PLACEMENT  2003   Medtronic, dual-chamber  . CARPAL TUNNEL RELEASE    . CHOLECYSTECTOMY    . COLONOSCOPY  2008  . HEMORRHOID SURGERY    . NASAL FRACTURE SURGERY    . THYROIDECTOMY  1980   Goiter  . TOTAL KNEE ARTHROPLASTY     Bilateral    Review of Systems  Constitutional: Negative for activity change, fatigue and fever.  HENT: Negative.   Eyes: Negative.   Respiratory: Negative.  Negative for cough.   Cardiovascular: Negative.  Negative for chest pain.  Gastrointestinal: Negative.  Negative for abdominal pain.  Endocrine: Negative.   Genitourinary: Negative.  Negative for dysuria.  Musculoskeletal: Positive for arthralgias and gait problem.  Skin: Negative.   Neurological: Positive for weakness. Negative for dizziness, syncope and numbness.    Allergies as of 10/15/2016      Reactions   Penicillins       Medication List       Accurate as of 10/15/16  2:08 PM. Always use your  most recent med list.          ALPRAZolam 0.5 MG tablet Commonly known as:  XANAX TAKE 1 TABLET TWICE DAILY AS NEEDED FOR ANXIETY   amLODipine-benazepril 10-20 MG capsule Commonly known as:  LOTREL Take 1 capsule by mouth daily.   aspirin EC 81 MG tablet Take 81 mg by mouth daily. Reported on 12/14/2015   ASSURE COMFORT LANCETS 30G Misc   budesonide-formoterol 80-4.5 MCG/ACT inhaler Commonly known as:  SYMBICORT Inhale 2 puffs into the lungs 2 (two) times daily.   CENTRUM WOMEN PO Take 1 tablet by mouth daily.   cyclobenzaprine 10 MG tablet Commonly known as:  FLEXERIL Take 1 tablet (10 mg total) by mouth 3 (three) times daily as needed for muscle spasms.   furosemide 40 MG tablet Commonly known as:   LASIX Take 0.5-1 tablets (20-40 mg total) by mouth daily as needed. Reported on 12/14/2015   gabapentin 300 MG capsule Commonly known as:  NEURONTIN TAKE (1) CAPSULE THREE TIMES DAILY.   glimepiride 4 MG tablet Commonly known as:  AMARYL TAKE (1) TABLET DAILY BE- FORE BREAKFAST.   glucose blood test strip Use TID and prn   ONETOUCH VERIO test strip Generic drug:  glucose blood Use as instructed   HUMULIN N 100 UNIT/ML injection Generic drug:  insulin NPH Human INJECT 10 UNITS SQ 2 TIMES A DAY BEFORE A MEAL   HYDROcodone-acetaminophen 5-325 MG tablet Commonly known as:  NORCO/VICODIN Take 1 tablet by mouth every 6 (six) hours as needed for moderate pain.   HYDROcodone-acetaminophen 5-325 MG tablet Commonly known as:  NORCO/VICODIN Take 1 tablet by mouth every 6 (six) hours as needed for moderate pain.   HYDROcodone-acetaminophen 5-325 MG tablet Commonly known as:  NORCO/VICODIN TAKE (1) TABLET EVERY SIX HOURS AS NEEDED FOR MODERATE PAIN   levothyroxine 200 MCG tablet Commonly known as:  SYNTHROID, LEVOTHROID TAKE (1) TABLET DAILY BE- FORE BREAKFAST.   lisinopril 10 MG tablet Commonly known as:  PRINIVIL,ZESTRIL Take 10 mg by mouth daily.   meloxicam 15 MG tablet Commonly known as:  MOBIC TAKE 1 TABLET DAILY   metFORMIN 1000 MG tablet Commonly known as:  GLUCOPHAGE TAKE (1) TABLET TWICE A DAY WITH MEALS (BREAKFAST AND SUPPER) FOR DIAB ETES   metoprolol succinate 25 MG 24 hr tablet Commonly known as:  TOPROL-XL Take 1 tablet (25 mg total) by mouth daily.   nitroGLYCERIN 0.4 MG/SPRAY spray Commonly known as:  NITROLINGUAL Place 1 spray under the tongue every 5 (five) minutes as needed.   nystatin powder Commonly known as:  MYCOSTATIN/NYSTOP APPLY 3 TIMES A DAY AS DIRECTED   nystatin-triamcinolone cream Commonly known as:  MYCOLOG II APPLY TO AFFECTED AREA 3 TIMES A DAY   pravastatin 80 MG tablet Commonly known as:  PRAVACHOL TAKE 1 TABLET ONCE DAILY FOR  CHOLESTEROL   PROAIR HFA 108 (90 Base) MCG/ACT inhaler Generic drug:  albuterol 2 PUFFS EVERY 6 HOURS AS NEEDED FOR WHEEZING   sertraline 50 MG tablet Commonly known as:  ZOLOFT TAKE 1 TABLET DAILY   SURE COMFORT INSULIN SYRINGE 31G X 5/16" 0.3 ML Misc Generic drug:  Insulin Syringe-Needle U-100          Objective:    BP 131/65   Pulse 60   Temp 97.4 F (36.3 C) (Oral)   Ht 5\' 10"  (1.778 m)   Wt 230 lb (104.3 kg)   BMI 33.00 kg/m   Allergies  Allergen Reactions  . Penicillins  Physical Exam  Constitutional: She is oriented to person, place, and time. She appears well-developed and well-nourished.  Patient is accompanied by daughter. She is wheelchair-bound today. She has been having severe pain in her hip from her fall.  HENT:  Head: Normocephalic and atraumatic.  Right Ear: Tympanic membrane, external ear and ear canal normal.  Left Ear: Tympanic membrane, external ear and ear canal normal.  Nose: Nose normal. No rhinorrhea.  Mouth/Throat: Oropharynx is clear and moist and mucous membranes are normal. No oropharyngeal exudate or posterior oropharyngeal erythema.  Eyes: Conjunctivae and EOM are normal. Pupils are equal, round, and reactive to light.  Neck: Normal range of motion. Neck supple.  Cardiovascular: Normal rate, regular rhythm, normal heart sounds and intact distal pulses.   Pulmonary/Chest: Effort normal and breath sounds normal.  Abdominal: Soft. Bowel sounds are normal.  Musculoskeletal:       Right hip: She exhibits decreased range of motion and decreased strength. She exhibits no deformity.  Neurological: She is alert and oriented to person, place, and time. She has normal reflexes.  Skin: Skin is warm and dry. No rash noted.  Psychiatric: She has a normal mood and affect. Her behavior is normal. Judgment and thought content normal.        Assessment & Plan:   1. Injury of right hip, initial encounter - DG HIP UNILAT WITH PELVIS 2-3 VIEWS  RIGHT; Future Rest, wheelchair, ice Requesting lift chair. Instructed them to make face-to-face appointment with her PCP for evaluation of this. She is given names of companies in the area that sell medical equipment like this.  Current Outpatient Prescriptions:  .  ALPRAZolam (XANAX) 0.5 MG tablet, TAKE 1 TABLET TWICE DAILY AS NEEDED FOR ANXIETY, Disp: 60 tablet, Rfl: 3 .  amLODipine-benazepril (LOTREL) 10-20 MG capsule, Take 1 capsule by mouth daily. , Disp: , Rfl:  .  aspirin EC 81 MG tablet, Take 81 mg by mouth daily. Reported on 12/14/2015, Disp: , Rfl:  .  ASSURE COMFORT LANCETS 30G MISC, , Disp: , Rfl:  .  budesonide-formoterol (SYMBICORT) 80-4.5 MCG/ACT inhaler, Inhale 2 puffs into the lungs 2 (two) times daily., Disp: 10.2 g, Rfl: 3 .  cyclobenzaprine (FLEXERIL) 10 MG tablet, Take 1 tablet (10 mg total) by mouth 3 (three) times daily as needed for muscle spasms., Disp: 30 tablet, Rfl: 1 .  furosemide (LASIX) 40 MG tablet, Take 0.5-1 tablets (20-40 mg total) by mouth daily as needed. Reported on 12/14/2015, Disp: 30 tablet, Rfl: 4 .  gabapentin (NEURONTIN) 300 MG capsule, TAKE (1) CAPSULE THREE TIMES DAILY., Disp: 90 capsule, Rfl: 0 .  glimepiride (AMARYL) 4 MG tablet, TAKE (1) TABLET DAILY BE- FORE BREAKFAST., Disp: 90 tablet, Rfl: 0 .  glucose blood test strip, Use TID and prn, Disp: 100 each, Rfl: 12 .  HUMULIN N 100 UNIT/ML injection, INJECT 10 UNITS SQ 2 TIMES A DAY BEFORE A MEAL, Disp: 20 mL, Rfl: 0 .  HYDROcodone-acetaminophen (NORCO/VICODIN) 5-325 MG tablet, Take 1 tablet by mouth every 6 (six) hours as needed for moderate pain., Disp: 60 tablet, Rfl: 0 .  HYDROcodone-acetaminophen (NORCO/VICODIN) 5-325 MG tablet, Take 1 tablet by mouth every 6 (six) hours as needed for moderate pain., Disp: 60 tablet, Rfl: 0 .  HYDROcodone-acetaminophen (NORCO/VICODIN) 5-325 MG tablet, TAKE (1) TABLET EVERY SIX HOURS AS NEEDED FOR MODERATE PAIN, Disp: 60 tablet, Rfl: 0 .  levothyroxine (SYNTHROID,  LEVOTHROID) 200 MCG tablet, TAKE (1) TABLET DAILY BE- FORE BREAKFAST., Disp: 90 tablet, Rfl: 3 .  lisinopril (PRINIVIL,ZESTRIL) 10 MG tablet, Take 10 mg by mouth daily., Disp: , Rfl:  .  meloxicam (MOBIC) 15 MG tablet, TAKE 1 TABLET DAILY, Disp: 30 tablet, Rfl: 0 .  metFORMIN (GLUCOPHAGE) 1000 MG tablet, TAKE (1) TABLET TWICE A DAY WITH MEALS (BREAKFAST AND SUPPER) FOR DIAB ETES, Disp: 180 tablet, Rfl: 0 .  metoprolol succinate (TOPROL-XL) 25 MG 24 hr tablet, Take 1 tablet (25 mg total) by mouth daily., Disp: 90 tablet, Rfl: 3 .  Multiple Vitamins-Minerals (CENTRUM WOMEN PO), Take 1 tablet by mouth daily., Disp: , Rfl:  .  nitroGLYCERIN (NITROLINGUAL) 0.4 MG/SPRAY spray, Place 1 spray under the tongue every 5 (five) minutes as needed., Disp: 12 g, Rfl: 2 .  nystatin (MYCOSTATIN/NYSTOP) powder, APPLY 3 TIMES A DAY AS DIRECTED, Disp: 60 g, Rfl: 2 .  nystatin-triamcinolone (MYCOLOG II) cream, APPLY TO AFFECTED AREA 3 TIMES A DAY, Disp: 60 g, Rfl: 2 .  ONETOUCH VERIO test strip, Use as instructed, Disp: 100 each, Rfl: 1 .  pravastatin (PRAVACHOL) 80 MG tablet, TAKE 1 TABLET ONCE DAILY FOR CHOLESTEROL, Disp: 90 tablet, Rfl: 0 .  PROAIR HFA 108 (90 Base) MCG/ACT inhaler, 2 PUFFS EVERY 6 HOURS AS NEEDED FOR WHEEZING, Disp: 25.5 g, Rfl: 1 .  sertraline (ZOLOFT) 50 MG tablet, TAKE 1 TABLET DAILY, Disp: 90 tablet, Rfl: 0 .  SURE COMFORT INSULIN SYRINGE 31G X 5/16" 0.3 ML MISC, , Disp: , Rfl:   Continue all other maintenance medications as listed above.  Follow up plan: Follow PCP for face to face eval for lift chair  Educational handout given for   Remus LofflerAngel S. Miriah Maruyama PA-C [facility] [address]  [place], [place] [id number] [phone]   10/15/2016, 2:08 PM

## 2016-10-17 ENCOUNTER — Telehealth: Payer: Self-pay | Admitting: Family

## 2016-10-17 NOTE — Telephone Encounter (Signed)
Daughter aware per dpr.  

## 2016-10-17 NOTE — Telephone Encounter (Signed)
The results showed no fracture in the hip.

## 2016-10-17 NOTE — Telephone Encounter (Signed)
Attempted to call- no answer

## 2016-10-17 NOTE — Telephone Encounter (Signed)
[email or web address]

## 2016-10-17 NOTE — Telephone Encounter (Signed)
Wanting hip xray results from 5/7 when she seen [facility]. Please review and advise

## 2016-10-18 ENCOUNTER — Other Ambulatory Visit: Payer: Self-pay | Admitting: Family

## 2016-10-25 ENCOUNTER — Ambulatory Visit (INDEPENDENT_AMBULATORY_CARE_PROVIDER_SITE_OTHER): Payer: Medicare Other | Admitting: Family

## 2016-10-25 VITALS — BP 131/58 | HR 59 | Temp 96.7°F | Ht 70.0 in | Wt 230.0 lb

## 2016-10-25 DIAGNOSIS — IMO0002 Reserved for concepts with insufficient information to code with codable children: Secondary | ICD-10-CM

## 2016-10-25 DIAGNOSIS — G6289 Other specified polyneuropathies: Secondary | ICD-10-CM

## 2016-10-25 DIAGNOSIS — I10 Essential (primary) hypertension: Secondary | ICD-10-CM | POA: Diagnosis not present

## 2016-10-25 DIAGNOSIS — J449 Chronic obstructive pulmonary disease, unspecified: Secondary | ICD-10-CM

## 2016-10-25 DIAGNOSIS — I5032 Chronic diastolic (congestive) heart failure: Secondary | ICD-10-CM

## 2016-10-25 DIAGNOSIS — E039 Hypothyroidism, unspecified: Secondary | ICD-10-CM

## 2016-10-25 DIAGNOSIS — E1143 Type 2 diabetes mellitus with diabetic autonomic (poly)neuropathy: Secondary | ICD-10-CM | POA: Diagnosis not present

## 2016-10-25 DIAGNOSIS — M1710 Unilateral primary osteoarthritis, unspecified knee: Secondary | ICD-10-CM | POA: Diagnosis not present

## 2016-10-25 DIAGNOSIS — E785 Hyperlipidemia, unspecified: Secondary | ICD-10-CM | POA: Diagnosis not present

## 2016-10-25 DIAGNOSIS — G8929 Other chronic pain: Secondary | ICD-10-CM

## 2016-10-25 DIAGNOSIS — M25561 Pain in right knee: Secondary | ICD-10-CM

## 2016-10-25 DIAGNOSIS — M545 Low back pain: Secondary | ICD-10-CM

## 2016-10-25 DIAGNOSIS — F411 Generalized anxiety disorder: Secondary | ICD-10-CM | POA: Diagnosis not present

## 2016-10-25 DIAGNOSIS — K219 Gastro-esophageal reflux disease without esophagitis: Secondary | ICD-10-CM

## 2016-10-25 LAB — BAYER DCA HB A1C WAIVED: HB A1C (BAYER DCA - WAIVED): 6.7 % (ref <7.0)

## 2016-10-25 MED ORDER — HYDROCODONE-ACETAMINOPHEN 5-325 MG PO TABS: 1.0000 | ORAL_TABLET | Freq: Four times a day (QID) | ORAL | 0 refills | Status: DC | PRN

## 2016-10-25 MED ORDER — HYDROCODONE-ACETAMINOPHEN 5-325 MG PO TABS: ORAL_TABLET | ORAL | 0 refills | Status: DC

## 2016-10-25 NOTE — Patient Instructions (Signed)
Diabetes Mellitus and Food It is important for you to manage your blood sugar (glucose) level. Your blood glucose level can be greatly affected by what you eat. Eating healthier foods in the appropriate amounts throughout the day at about the same time each day will help you control your blood glucose level. It can also help slow or prevent worsening of your diabetes mellitus. Healthy eating may even help you improve the level of your blood pressure and reach or maintain a healthy weight. General recommendations for healthful eating and cooking habits include:  Eating meals and snacks regularly. Avoid going long periods of time without eating to lose weight.  Eating a diet that consists mainly of plant-based foods, such as fruits, vegetables, nuts, legumes, and whole grains.  Using low-heat cooking methods, such as baking, instead of high-heat cooking methods, such as deep frying.  Work with your dietitian to make sure you understand how to use the Nutrition Facts information on food labels. How can food affect me? Carbohydrates Carbohydrates affect your blood glucose level more than any other type of food. Your dietitian will help you determine how many carbohydrates to eat at each meal and teach you how to count carbohydrates. Counting carbohydrates is important to keep your blood glucose at a healthy level, especially if you are using insulin or taking certain medicines for diabetes mellitus. Alcohol Alcohol can cause sudden decreases in blood glucose (hypoglycemia), especially if you use insulin or take certain medicines for diabetes mellitus. Hypoglycemia can be a life-threatening condition. Symptoms of hypoglycemia (sleepiness, dizziness, and disorientation) are similar to symptoms of having too much alcohol. If your health care provider has given you approval to drink alcohol, do so in moderation and use the following guidelines:  Women should not have more than one drink per day, and men  should not have more than two drinks per day. One drink is equal to: ? 12 oz of beer. ? 5 oz of wine. ? 1 oz of hard liquor.  Do not drink on an empty stomach.  Keep yourself hydrated. Have water, diet soda, or unsweetened iced tea.  Regular soda, juice, and other mixers might contain a lot of carbohydrates and should be counted.  What foods are not recommended? As you make food choices, it is important to remember that all foods are not the same. Some foods have fewer nutrients per serving than other foods, even though they might have the same number of calories or carbohydrates. It is difficult to get your body what it needs when you eat foods with fewer nutrients. Examples of foods that you should avoid that are high in calories and carbohydrates but low in nutrients include:  Trans fats (most processed foods list trans fats on the Nutrition Facts label).  Regular soda.  Juice.  Candy.  Sweets, such as cake, pie, doughnuts, and cookies.  Fried foods.  What foods can I eat? Eat nutrient-rich foods, which will nourish your body and keep you healthy. The food you should eat also will depend on several factors, including:  The calories you need.  The medicines you take.  Your weight.  Your blood glucose level.  Your blood pressure level.  Your cholesterol level.  You should eat a variety of foods, including:  Protein. ? Lean cuts of meat. ? Proteins low in saturated fats, such as fish, egg whites, and beans. Avoid processed meats.  Fruits and vegetables. ? Fruits and vegetables that may help control blood glucose levels, such as apples,   mangoes, and yams.  Dairy products. ? Choose fat-free or low-fat dairy products, such as milk, yogurt, and cheese.  Grains, bread, pasta, and rice. ? Choose whole grain products, such as multigrain bread, whole oats, and brown rice. These foods may help control blood pressure.  Fats. ? Foods containing healthful fats, such as  nuts, avocado, olive oil, canola oil, and fish.  Does everyone with diabetes mellitus have the same meal plan? Because every person with diabetes mellitus is different, there is not one meal plan that works for everyone. It is very important that you meet with a dietitian who will help you create a meal plan that is just right for you. This information is not intended to replace advice given to you by your health care provider. Make sure you discuss any questions you have with your health care provider. Document Released: 02/22/2005 Document Revised: 11/03/2015 Document Reviewed: 04/24/2013 Elsevier Interactive Patient Education  2017 [employer] Inc.  

## 2016-10-25 NOTE — Progress Notes (Signed)
Subjective:    Patient ID: [name] [name], female    DOB: 1933/04/30, 83 y.o.   MRN: [id number]  Pt presents to the office today for chronic follow up. Pt has pacemaker for sinoatrial node dysfunction, CHF, PVD,  and is followed by her Cardiologist every 6 months. .  Diabetes  She presents for her follow-up diabetic visit. She has type 2 diabetes mellitus. There are no hypoglycemic associated symptoms. Associated symptoms include fatigue and foot paresthesias. Pertinent negatives for diabetes include no chest pain. Symptoms are worsening. Diabetic complications include a CVA, heart disease and peripheral neuropathy. Her breakfast blood glucose range is generally 130-140 mg/dl.  Hypertension  This is a chronic problem. The current episode started more than 1 year ago. The problem has been waxing and waning since onset. The problem is controlled. Associated symptoms include peripheral edema ("sometimes in right ankle at night") and shortness of breath (at times). Pertinent negatives include no chest pain. The current treatment provides mild improvement. Hypertensive end-organ damage includes kidney disease, CAD/MI and CVA. Identifiable causes of hypertension include a thyroid problem.  Arthritis  Presents for follow-up visit. She complains of pain and stiffness. Affected locations include the right knee and right hip (back). Her pain is at a severity of 10/10. Associated symptoms include fatigue. Pertinent negatives include no diarrhea.  Thyroid Problem  Presents for follow-up visit. Symptoms include fatigue. Patient reports no constipation or diarrhea. The symptoms have been stable. Her past medical history is significant for hyperlipidemia.  Hyperlipidemia  This is a chronic problem. The current episode started more than 1 year ago. The problem is controlled. Recent lipid tests were reviewed and are normal. Associated symptoms include shortness of breath (at times). Pertinent negatives include no  chest pain. Current antihyperlipidemic treatment includes statins. The current treatment provides moderate improvement of lipids. Risk factors for coronary artery disease include dyslipidemia.  Depression         This is a chronic problem.  The current episode started more than 1 year ago.   The onset quality is gradual.   The problem occurs daily.  Associated symptoms include fatigue, irritable and sad.  Associated symptoms include no helplessness and no hopelessness.  Past treatments include SSRIs - Selective serotonin reuptake inhibitors.  Past medical history includes thyroid problem.   COPD PT taking Symbicort. Stable  Pain assessment: Cause of pain- Right hip and back Pain on scale of 1-10- 7  Frequency- Constantly What increases pain-Laying down and standing up too long What makes pain Better-Rest and pain medication   Current medications- Norco every 6 hours as needed, #60  Effectiveness of current meds-Stable  Pill count performed-No Urine drug screen- No Was the [facility] reviewed- Yes  If yes were their any concerning findings? Pt has only received controlled medications from me   Review of Systems  Constitutional: Positive for fatigue.  Respiratory: Positive for shortness of breath (at times).   Cardiovascular: Negative for chest pain.  Gastrointestinal: Negative for constipation and diarrhea.  Musculoskeletal: Positive for arthralgias, arthritis, back pain, gait problem and stiffness.  Psychiatric/Behavioral: Positive for depression.  All other systems reviewed and are negative.      Objective:   Physical Exam  Constitutional: She is oriented to person, place, and time. She appears well-developed and well-nourished. She is irritable. No distress.  HENT:  Head: Normocephalic and atraumatic.  Right Ear: External ear normal.  Left Ear: External ear normal.  Nose: Nose normal.  Mouth/Throat: Oropharynx is clear and moist.  Eyes: Pupils are equal, round, and reactive to  light.  Neck: Normal range of motion. Neck supple. No thyromegaly present.  Cardiovascular: Normal rate, regular rhythm, normal heart sounds and intact distal pulses.   No murmur heard. Pulmonary/Chest: Effort normal and breath sounds normal. No respiratory distress. She has no wheezes.  Abdominal: Soft. Bowel sounds are normal. She exhibits no distension. There is no tenderness.  Musculoskeletal: Normal range of motion. She exhibits tenderness. She exhibits no edema.  Tenderness in right hip with extension or flexion and lower back with flexion.   Neurological: She is alert and oriented to person, place, and time.  Skin: Skin is warm and dry.  Psychiatric: She has a normal mood and affect. Her behavior is normal. Judgment and thought content normal.  Vitals reviewed.    BP (!) 147/74   Pulse 89   Temp (!) 96.7 F (35.9 C) (Oral)   Ht '5\' 10"'  (1.778 m)   Wt 230 lb (104.3 kg)   BMI 33.00 kg/m      Assessment & Plan:  1. Essential hypertension - CMP14+EGFR  2. Type II diabetes mellitus with peripheral autonomic neuropathy (HCC) - CMP14+EGFR - Bayer DCA Hb A1c Waived  3. Hypothyroidism, unspecified type - CMP14+EGFR - Thyroid Panel With TSH  4. Osteoarthrosis involving lower leg - CMP14+EGFR  5. Hyperlipidemia, unspecified hyperlipidemia type - CMP14+EGFR - Lipid panel  6. Gastroesophageal reflux disease, esophagitis presence not specified - CMP14+EGFR  7. Chronic diastolic heart failure (HCC) - CMP14+EGFR  8. Chronic obstructive pulmonary disease, unspecified COPD type ([place]) - CMP14+EGFR  9. GAD (generalized anxiety disorder) - CMP14+EGFR  10. Morbid obesity ([place]) - CMP14+EGFR  11. Other polyneuropathy - HYDROcodone-acetaminophen (NORCO/VICODIN) 5-325 MG tablet; TAKE (1) TABLET EVERY SIX HOURS AS NEEDED FOR MODERATE PAIN  Dispense: 60 tablet; Refill: 0 - HYDROcodone-acetaminophen (NORCO/VICODIN) 5-325 MG tablet; Take 1 tablet by mouth every 6 (six) hours as  needed for moderate pain.  Dispense: 60 tablet; Refill: 0 - HYDROcodone-acetaminophen (NORCO/VICODIN) 5-325 MG tablet; Take 1 tablet by mouth every 6 (six) hours as needed for moderate pain.  Dispense: 60 tablet; Refill: 0  12. Chronic pain of right knee - HYDROcodone-acetaminophen (NORCO/VICODIN) 5-325 MG tablet; TAKE (1) TABLET EVERY SIX HOURS AS NEEDED FOR MODERATE PAIN  Dispense: 60 tablet; Refill: 0   Continue all meds, keep all follow up appts with specialists  Labs pending Health Maintenance reviewed Diet and exercise encouraged RTO 3 months   Evelina Dun, FNP

## 2016-10-26 LAB — CMP14+EGFR
ALT: 15 IU/L (ref 0–32)
AST: 23 IU/L (ref 0–40)
Albumin/Globulin Ratio: 1.9 (ref 1.2–2.2)
Albumin: 4.3 g/dL (ref 3.5–4.7)
Alkaline Phosphatase: 72 IU/L (ref 39–117)
BUN/Creatinine Ratio: 20 (ref 12–28)
BUN: 23 mg/dL (ref 8–27)
Bilirubin Total: 0.4 mg/dL (ref 0.0–1.2)
CO2: 23 mmol/L (ref 18–29)
Calcium: 9.8 mg/dL (ref 8.7–10.3)
Chloride: 97 mmol/L (ref 96–106)
Creatinine, Ser: 1.14 mg/dL — ABNORMAL HIGH (ref 0.57–1.00)
GFR calc Af Amer: 52 mL/min/1.73 — ABNORMAL LOW (ref >59)
GFR calc non Af Amer: 45 mL/min/1.73 — ABNORMAL LOW (ref >59)
Globulin, Total: 2.3 g/dL (ref 1.5–4.5)
Glucose: 92 mg/dL (ref 65–99)
Potassium: 5 mmol/L (ref 3.5–5.2)
Sodium: 138 mmol/L (ref 134–144)
Total Protein: 6.6 g/dL (ref 6.0–8.5)

## 2016-10-26 LAB — THYROID PANEL WITH TSH
FREE THYROXINE INDEX: 1.8 (ref 1.2–4.9)
T3 UPTAKE RATIO: 27 % (ref 24–39)
T4, Total: 6.6 ug/dL (ref 4.5–12.0)
TSH: 11.14 u[IU]/mL — ABNORMAL HIGH (ref 0.450–4.500)

## 2016-10-26 LAB — LIPID PANEL
CHOLESTEROL TOTAL: 153 mg/dL (ref 100–199)
Chol/HDL Ratio: 2.8 ratio (ref 0.0–4.4)
HDL: 55 mg/dL (ref >39)
LDL CALC: 71 mg/dL (ref 0–99)
TRIGLYCERIDES: 133 mg/dL (ref 0–149)
VLDL Cholesterol Cal: 27 mg/dL (ref 5–40)

## 2016-10-29 ENCOUNTER — Telehealth: Payer: Self-pay | Admitting: Family

## 2016-10-29 ENCOUNTER — Other Ambulatory Visit: Payer: Self-pay | Admitting: Family

## 2016-10-29 NOTE — Telephone Encounter (Signed)
Pt states she is taking 200 mcg of levothyroxine daily.

## 2016-10-29 NOTE — Telephone Encounter (Signed)
Pt is a poor historian. I will hold off on any med changes at this time and will recheck. If elevated will change at next appt.

## 2016-11-14 ENCOUNTER — Other Ambulatory Visit: Payer: Self-pay | Admitting: Family

## 2016-11-16 ENCOUNTER — Other Ambulatory Visit: Payer: Self-pay | Admitting: Family

## 2016-11-24 ENCOUNTER — Other Ambulatory Visit: Payer: Self-pay | Admitting: Family

## 2016-11-24 DIAGNOSIS — F411 Generalized anxiety disorder: Secondary | ICD-10-CM

## 2016-11-29 ENCOUNTER — Ambulatory Visit (INDEPENDENT_AMBULATORY_CARE_PROVIDER_SITE_OTHER): Payer: Medicare Other | Admitting: Internal Medicine

## 2016-11-29 ENCOUNTER — Encounter: Payer: Self-pay | Admitting: Internal Medicine

## 2016-11-29 DIAGNOSIS — I495 Sick sinus syndrome: Secondary | ICD-10-CM | POA: Diagnosis not present

## 2016-11-29 NOTE — Progress Notes (Signed)
HPI [name] [name] returns today for followup. She is a pleasant elderly woman, with a h/o symptomatic bradycardia due to CHB, who underwent PPM years ago. Since then, she has done fairly well. She denies chest pain. No edema. She is sleeping well though she gets up a couple of times at night. No hospitaliations.   Allergies  Allergen Reactions  . Penicillins      Current Outpatient Prescriptions  Medication Sig Dispense Refill  . ALPRAZolam (XANAX) 0.5 MG tablet TAKE 1 TABLET TWICE DAILY AS NEEDED FOR ANXIETY 60 tablet 3  . amLODipine-benazepril (LOTREL) 10-20 MG capsule Take 1 capsule by mouth daily.     Marland Kitchen aspirin EC 81 MG tablet Take 81 mg by mouth daily. Reported on 12/14/2015    . ASSURE COMFORT LANCETS 30G MISC     . budesonide-formoterol (SYMBICORT) 80-4.5 MCG/ACT inhaler Inhale 2 puffs into the lungs 2 (two) times daily. 10.2 g 3  . cyclobenzaprine (FLEXERIL) 10 MG tablet Take 1 tablet (10 mg total) by mouth 3 (three) times daily as needed for muscle spasms. 30 tablet 1  . furosemide (LASIX) 40 MG tablet Take 0.5-1 tablets (20-40 mg total) by mouth daily as needed. Reported on 12/14/2015 30 tablet 4  . gabapentin (NEURONTIN) 300 MG capsule TAKE (1) CAPSULE THREE TIMES DAILY. 90 capsule 1  . glimepiride (AMARYL) 4 MG tablet TAKE (1) TABLET DAILY BE- FORE BREAKFAST. 90 tablet 0  . glucose blood test strip Use TID and prn 100 each 12  . HUMULIN N 100 UNIT/ML injection INJECT 10 UNITS SQ 2 TIMES A DAY BEFORE A MEAL 20 mL 0  . HYDROcodone-acetaminophen (NORCO/VICODIN) 5-325 MG tablet TAKE (1) TABLET EVERY SIX HOURS AS NEEDED FOR MODERATE PAIN 60 tablet 0  . levothyroxine (SYNTHROID, LEVOTHROID) 200 MCG tablet TAKE (1) TABLET DAILY BE- FORE BREAKFAST. 90 tablet 3  . lisinopril (PRINIVIL,ZESTRIL) 10 MG tablet TAKE 1 TABLET DAILY 90 tablet 1  . meloxicam (MOBIC) 15 MG tablet TAKE 1 TABLET DAILY 30 tablet 0  . metFORMIN (GLUCOPHAGE) 1000 MG tablet TAKE (1) TABLET TWICE A DAY WITH MEALS (BREAKFAST  AND SUPPER) FOR DIAB ETES 180 tablet 0  . metoprolol succinate (TOPROL-XL) 25 MG 24 hr tablet Take 1 tablet (25 mg total) by mouth daily. 90 tablet 3  . Multiple Vitamins-Minerals (CENTRUM WOMEN PO) Take 1 tablet by mouth daily.    . nitroGLYCERIN (NITROLINGUAL) 0.4 MG/SPRAY spray Place 1 spray under the tongue every 5 (five) minutes as needed. 12 g 2  . nystatin-triamcinolone (MYCOLOG II) cream APPLY TO AFFECTED AREA 3 TIMES A DAY 60 g 0  . ONETOUCH VERIO test strip Use as instructed 100 each 1  . pravastatin (PRAVACHOL) 80 MG tablet TAKE 1 TABLET ONCE DAILY FOR CHOLESTEROL 90 tablet 1  . PROAIR HFA 108 (90 Base) MCG/ACT inhaler 2 PUFFS EVERY 6 HOURS AS NEEDED FOR WHEEZING 25.5 g 1  . sertraline (ZOLOFT) 50 MG tablet TAKE 1 TABLET DAILY 90 tablet 0  . SURE COMFORT INSULIN SYRINGE 31G X 5/16" 0.3 ML MISC     . amLODipine-benazepril (LOTREL) 10-40 MG capsule TAKE (1) CAPSULE DAILY (Patient not taking: Reported on 11/29/2016) 90 capsule 1  . HYDROcodone-acetaminophen (NORCO/VICODIN) 5-325 MG tablet Take 1 tablet by mouth every 6 (six) hours as needed for moderate pain. (Patient not taking: Reported on 11/29/2016) 60 tablet 0  . HYDROcodone-acetaminophen (NORCO/VICODIN) 5-325 MG tablet Take 1 tablet by mouth every 6 (six) hours as needed for moderate pain. (Patient not taking: Reported on  11/29/2016) 60 tablet 0  . metoprolol succinate (TOPROL-XL) 50 MG 24 hr tablet TAKE 1 TABLET ONCE DAILY AFTER MEAL (Patient not taking: Reported on 11/29/2016) 90 tablet 1   No current facility-administered medications for this visit.      Past Medical History:  Diagnosis Date  . ANXIETY   . Arteriosclerotic cardiovascular disease (ASCVD)    Cath in 9/99-70% mid LAD with diffuse distal disease, 70% T1, 60% mid circumflex, 50% mid RCA, normal ejection fraction; negative stress nuclear in 07/2008  . Carpal tunnel syndrome   . Cerebrovascular disease    carotid bruits; no focal disease in 1999, 2002 and 2006  .  Closed left fibular fracture 11/30/11  . COPD   . Diabetes mellitus    Insulin treatment  . Diastolic CHF, chronic (HCC)    Normal EF  . Gastroesophageal reflux disease    With hiatal hernia; irritable bowel syndrome; H. pylori-treated; Colonoscopy 2008: non-specific colitis, IH, Diverticulosis, Rectal ulcer secondary to ASA  . Hyperlipidemia   . Hypertension   . Hypothyroidism   . OSTEOARTHRITIS, KNEES, BILATERAL    Bilateral TKA  . Pacemaker   . Peripheral neuropathy   . Sinoatrial node dysfunction (HCC) 2003   Medtronic dual-chamber device  . Sleep apnea   . VITAMIN B12 DEFICIENCY     ROS:   All systems reviewed and negative except as noted in the HPI.   Past Surgical History:  Procedure Laterality Date  . ABDOMINAL HYSTERECTOMY    . BLADDER REPAIR    . CARDIAC PACEMAKER PLACEMENT  2003   Medtronic, dual-chamber  . CARPAL TUNNEL RELEASE    . CHOLECYSTECTOMY    . COLONOSCOPY  2008  . HEMORRHOID SURGERY    . NASAL FRACTURE SURGERY    . THYROIDECTOMY  1980   Goiter  . TOTAL KNEE ARTHROPLASTY     Bilateral     Family History  Problem Relation Age of Onset  . COPD Father   . Heart attack Father   . Heart failure Mother   . Cancer Sister        colon  . Cancer Brother        lung  . Cancer Sister        bladder  . Cancer Brother        lung  . Early death Son   . Diabetes Son   . Heart disease Son   . Cancer Son        prostate  . Cancer Sister        kidney cancer  . Diabetes Sister      Social History   Social History  . Marital status: Married    Spouse name: husband has dementia  . Number of children: N/A  . Years of education: 8   Occupational History  . retired Retired   Social History Main Topics  . Smoking status: Never Smoker  . Smokeless tobacco: Never Used  . Alcohol use No  . Drug use: No  . Sexual activity: Not Currently   Other Topics Concern  . Not on file   Social History Narrative  . No narrative on file     BP  129/83   Pulse 61   Ht 5\' 10"  (1.778 m)   Wt 212 lb (96.2 kg)   BMI 30.42 kg/m   Physical Exam:  Obese appearing elderly woman, NAD HEENT: Unremarkable Neck:  7 cm JVD, no thyromegally Lungs:  Clear with no wheezes, rales, or  rhonchi. Well-healed pacemaker incision HEART:  Regular rate rhythm, no murmurs, no rubs, no clicks Abd:  soft, positive bowel sounds, no organomegally, no rebound, no guarding Ext:  2 plus pulses, 2+ peripheral edema, no cyanosis, no clubbing Skin:  No rashes no nodules Neuro:  CN II through XII intact, motor grossly intact   DEVICE  Normal device function.  See PaceArt for details.   Assess/Plan:  1. Complete heart block - she is asymptomatic, s/p PPM insertion 2. HTN - her blood pressure is well controlled. She is encouraged to maintain a low sodium diet. 3. PPM - her medtronic DDD PM is working normally. Will recheck in several months. 4. CAD - she denies anginal symptoms. She is encouraged to walk more and to increase her activity.  Lewayne Bunting, M.D.

## 2016-11-29 NOTE — Patient Instructions (Signed)
Medication Instructions:    Your physician recommends that you continue on your current medications as directed. Please refer to the Current Medication list given to you today.  --- If you need a refill on your cardiac medications before your next appointment, please call your pharmacy. ---  Labwork:  None ordered  Testing/Procedures:  None ordered  Follow-Up:  Your physician wants you to follow-up in: 6 months with device clinic.  You will receive a reminder letter in the mail two months in advance. If you don't receive a letter, please call our office to schedule the follow-up appointment.    Your physician wants you to follow-up in: 1 year with Dr. Taylor.  You will receive a reminder letter in the mail two months in advance. If you don't receive a letter, please call our office to schedule the follow-up appointment.  Thank you for choosing [facility]!!            

## 2016-12-17 ENCOUNTER — Other Ambulatory Visit: Payer: Self-pay | Admitting: Family

## 2016-12-18 LAB — CUP PACEART INCLINIC DEVICE CHECK
Battery Impedance: 1519 Ohm
Battery Voltage: 2.76 V
Brady Statistic AS VP Percent: 27 %
Date Time Interrogation Session: 20180621181613
Implantable Lead Implant Date: 20030716
Implantable Lead Implant Date: 20030716
Implantable Lead Location: 753859
Implantable Lead Model: 5076
Implantable Pulse Generator Implant Date: 20120628
Lead Channel Impedance Value: 654 Ohm
Lead Channel Pacing Threshold Amplitude: 0.375 V
Lead Channel Pacing Threshold Pulse Width: 0.4 ms
Lead Channel Pacing Threshold Pulse Width: 0.4 ms
Lead Channel Setting Pacing Amplitude: 2.5 V
MDC IDC LEAD LOCATION: 753860
MDC IDC MSMT BATTERY REMAINING LONGEVITY: 37 mo
MDC IDC MSMT LEADCHNL RA IMPEDANCE VALUE: 462 Ohm
MDC IDC MSMT LEADCHNL RA PACING THRESHOLD AMPLITUDE: 0.5 V
MDC IDC MSMT LEADCHNL RA PACING THRESHOLD PULSEWIDTH: 0.4 ms
MDC IDC MSMT LEADCHNL RV PACING THRESHOLD AMPLITUDE: 0.5 V
MDC IDC MSMT LEADCHNL RV PACING THRESHOLD AMPLITUDE: 0.75 V
MDC IDC MSMT LEADCHNL RV PACING THRESHOLD PULSEWIDTH: 0.4 ms
MDC IDC SET LEADCHNL RA PACING AMPLITUDE: 2 V
MDC IDC SET LEADCHNL RV PACING PULSEWIDTH: 0.4 ms
MDC IDC SET LEADCHNL RV SENSING SENSITIVITY: 2.8 mV
MDC IDC STAT BRADY AP VP PERCENT: 73 %
MDC IDC STAT BRADY AP VS PERCENT: 0 %
MDC IDC STAT BRADY AS VS PERCENT: 0 %

## 2016-12-27 ENCOUNTER — Ambulatory Visit: Payer: Self-pay | Admitting: Pharmacist

## 2016-12-28 ENCOUNTER — Other Ambulatory Visit: Payer: Self-pay | Admitting: Family

## 2017-01-10 ENCOUNTER — Other Ambulatory Visit: Payer: Self-pay | Admitting: Family

## 2017-01-14 ENCOUNTER — Ambulatory Visit (INDEPENDENT_AMBULATORY_CARE_PROVIDER_SITE_OTHER): Payer: Medicare Other | Admitting: Pharmacist

## 2017-01-14 ENCOUNTER — Encounter: Payer: Self-pay | Admitting: Pharmacist

## 2017-01-14 VITALS — BP 92/52 | HR 68 | Ht 70.0 in | Wt 215.0 lb

## 2017-01-14 DIAGNOSIS — Z0000 Encounter for general adult medical examination without abnormal findings: Secondary | ICD-10-CM

## 2017-01-14 DIAGNOSIS — E890 Postprocedural hypothyroidism: Secondary | ICD-10-CM | POA: Diagnosis not present

## 2017-01-14 DIAGNOSIS — E11649 Type 2 diabetes mellitus with hypoglycemia without coma: Secondary | ICD-10-CM

## 2017-01-14 DIAGNOSIS — Z780 Asymptomatic menopausal state: Secondary | ICD-10-CM

## 2017-01-14 DIAGNOSIS — N644 Mastodynia: Secondary | ICD-10-CM

## 2017-01-14 MED ORDER — INSULIN NPH (HUMAN) (ISOPHANE) 100 UNIT/ML ~~LOC~~ SUSP: 8.0000 [IU] | Freq: Two times a day (BID) | SUBCUTANEOUS | 0 refills | Status: DC

## 2017-01-14 MED ORDER — METOPROLOL SUCCINATE ER 25 MG PO TB24: 25.0000 mg | ORAL_TABLET | Freq: Every day | ORAL | 0 refills | Status: DC

## 2017-01-14 MED ORDER — METOPROLOL SUCCINATE ER 50 MG PO TB24: 25.0000 mg | ORAL_TABLET | Freq: Every day | ORAL | 0 refills | Status: DC

## 2017-01-14 NOTE — Progress Notes (Signed)
Patient ID: [name] [name], female   DOB: [birth year], 83 y.o.   MRN: [id number]     Subjective:   [name] [name] is a 83 y.o. female who presents for a subsquent Medicare Annual Wellness Visit.  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   Alcohol/Tobacco/Substances: none  In reviewing patient's medications / she brought in bottle there are several medication related concerns that I have.  Two strengths of metoprolol on her profile - 25mg  qd and 50mg  qd.  She has 2.5 bottles of metoprolol 50mg  but it appears she has not taken in awhile based on refill history and amt in bottles.  Has two strengths of generic Lotrel (amlodipne / benazepril) on profile 10/20mg  and 10/40mg  - she also has 2 bottle of the 10/40mg  strength which appears that she is not taking on a regular basis.  She is also taking lisinopril 10mg  daily. Appears to be compliant based on refill history and amount in bottle.  She reports that she fell 3 weeks ago due to dizziness.   When asked about hypoglycemia - reports about 2 episodes per week of BG in 60's to 80's  Patient also reports pain in left breast - last Mammogram 2012  Current Medications (verified) Outpatient Encounter Prescriptions as of 01/14/2017  Medication Sig  . ALPRAZolam (XANAX) 0.5 MG tablet TAKE 1 TABLET TWICE DAILY AS NEEDED FOR ANXIETY  . aspirin EC 81 MG tablet Take 81 mg by mouth daily. Reported on 12/14/2015  . ASSURE COMFORT LANCETS 30G MISC   . budesonide-formoterol (SYMBICORT) 80-4.5 MCG/ACT inhaler Inhale 2 puffs into the lungs 2 (two) times daily.  . furosemide (LASIX) 40 MG tablet Take 0.5-1 tablets (20-40 mg total) by mouth daily as needed. Reported on 12/14/2015  . gabapentin (NEURONTIN) 300 MG capsule TAKE (1) CAPSULE THREE TIMES DAILY.  Marland Kitchen. glimepiride (AMARYL) 4 MG tablet TAKE (1) TABLET DAILY BE- FORE BREAKFAST.  Marland Kitchen.  glucose blood test strip Use TID and prn  . HYDROcodone-acetaminophen (NORCO/VICODIN) 5-325 MG tablet TAKE (1) TABLET EVERY SIX HOURS AS NEEDED FOR MODERATE PAIN  . HYDROcodone-acetaminophen (NORCO/VICODIN) 5-325 MG tablet Take 1 tablet by mouth every 6 (six) hours as needed for moderate pain.  Marland Kitchen. HYDROcodone-acetaminophen (NORCO/VICODIN) 5-325 MG tablet Take 1 tablet by mouth every 6 (six) hours as needed for moderate pain.  Marland Kitchen. insulin NPH Human (HUMULIN N) 100 UNIT/ML injection Inject 0.08-0.1 mLs (8-10 Units total) into the skin 2 (two) times daily before a meal.  . levothyroxine (SYNTHROID, LEVOTHROID) 200 MCG tablet TAKE (1) TABLET DAILY BE- FORE BREAKFAST.  Marland Kitchen. lisinopril (PRINIVIL,ZESTRIL) 10 MG tablet TAKE 1 TABLET DAILY  . metFORMIN (GLUCOPHAGE) 1000 MG tablet TAKE (1) TABLET TWICE A DAY WITH MEALS (BREAKFAST AND SUPPER) FOR DIAB ETES (Patient taking differently: TAKE (1) TABLET TWICE A DAY WITH MEALS (BREAKFAST AND SUPPER) FOR DIABETES)  . Multiple Vitamins-Minerals (CENTRUM WOMEN PO) Take 1 tablet by mouth daily.  . nitroGLYCERIN (NITROLINGUAL) 0.4 MG/SPRAY spray Place 1 spray under the tongue every 5 (five) minutes as needed.  Letta Pate. ONETOUCH VERIO test strip Use as instructed  . pravastatin (PRAVACHOL) 80 MG tablet TAKE 1 TABLET ONCE DAILY FOR CHOLESTEROL  . PROAIR HFA 108 (90 Base) MCG/ACT inhaler 2 PUFFS EVERY 6 HOURS AS NEEDED FOR WHEEZING  . sertraline (ZOLOFT) 50 MG tablet TAKE 1 TABLET DAILY  . SURE COMFORT INSULIN SYRINGE 31G X 5/16" 0.3 ML MISC   . [  DISCONTINUED] amLODipine-benazepril (LOTREL) 10-40 MG capsule TAKE (1) CAPSULE DAILY  . [DISCONTINUED] HUMULIN N 100 UNIT/ML injection INJECT 10 UNITS SQ 2 TIMES A DAY BEFORE A MEAL  . [DISCONTINUED] insulin NPH Human (HUMULIN N) 100 UNIT/ML injection Inject 0.08-0.1 mLs (8-10 Units total) into the skin 2 (two) times daily before a meal.  . cyclobenzaprine (FLEXERIL) 10 MG tablet Take 1 tablet (10 mg total) by mouth 3 (three) times daily as  needed for muscle spasms. (Patient not taking: Reported on 01/14/2017)  . meloxicam (MOBIC) 15 MG tablet TAKE 1 TABLET DAILY (Patient not taking: Reported on 01/14/2017)  . metoprolol succinate (TOPROL-XL) 25 MG 24 hr tablet Take 1 tablet (25 mg total) by mouth daily. Take with or immediately following a meal.  . nystatin-triamcinolone (MYCOLOG II) cream APPLY TO AFFECTED AREA 3 TIMES A DAY (Patient not taking: Reported on 01/14/2017)  . [DISCONTINUED] amLODipine-benazepril (LOTREL) 10-20 MG capsule Take 1 capsule by mouth daily.   . [DISCONTINUED] metoprolol succinate (TOPROL-XL) 25 MG 24 hr tablet Take 1 tablet (25 mg total) by mouth daily. (Patient not taking: Reported on 01/14/2017)  . [DISCONTINUED] metoprolol succinate (TOPROL-XL) 50 MG 24 hr tablet TAKE 1 TABLET ONCE DAILY AFTER MEAL (Patient not taking: Reported on 11/29/2016)  . [DISCONTINUED] metoprolol succinate (TOPROL-XL) 50 MG 24 hr tablet Take 1 tablet (50 mg total) by mouth daily. Take with or immediately following a meal.   No facility-administered encounter medications on file as of 01/14/2017.     Allergies (verified) Penicillins   History: Past Medical History:  Diagnosis Date  . ANXIETY   . Arteriosclerotic cardiovascular disease (ASCVD)    Cath in 9/99-70% mid LAD with diffuse distal disease, 70% T1, 60% mid circumflex, 50% mid RCA, normal ejection fraction; negative stress nuclear in 07/2008  . Carpal tunnel syndrome   . Cerebrovascular disease    carotid bruits; no focal disease in 1999, 2002 and 2006  . Closed left fibular fracture 11/30/11  . COPD   . Diabetes mellitus    Insulin treatment  . Diastolic CHF, chronic (HCC)    Normal EF  . Gastroesophageal reflux disease    With hiatal hernia; irritable bowel syndrome; H. pylori-treated; Colonoscopy 2008: non-specific colitis, IH, Diverticulosis, Rectal ulcer secondary to ASA  . Hyperlipidemia   . Hypertension   . Hypothyroidism   . OSTEOARTHRITIS, KNEES, BILATERAL     Bilateral TKA  . Pacemaker   . Peripheral neuropathy   . Sinoatrial node dysfunction (HCC) 2003   Medtronic dual-chamber device  . Sleep apnea   . VITAMIN B12 DEFICIENCY    Past Surgical History:  Procedure Laterality Date  . ABDOMINAL HYSTERECTOMY    . BLADDER REPAIR    . CARDIAC PACEMAKER PLACEMENT  2003   Medtronic, dual-chamber  . CARPAL TUNNEL RELEASE    . CHOLECYSTECTOMY    . COLONOSCOPY  2008  . HEMORRHOID SURGERY    . NASAL FRACTURE SURGERY    . THYROIDECTOMY  1980   Goiter  . TOTAL KNEE ARTHROPLASTY     Bilateral   Family History  Problem Relation Age of Onset  . COPD Father   . Heart attack Father 11  . Heart failure Mother   . Cancer Sister        colon  . Cancer Brother        lung  . Cancer Sister        bladder  . Cancer Brother        lung  .  Early death Son   . Diabetes Son   . Heart disease Son   . Cancer Son        prostate  . Cancer Sister        kidney cancer  . Diabetes Sister   . Cancer Daughter    Social History   Occupational History  . retired Retired   Social History Main Topics  . Smoking status: Never Smoker  . Smokeless tobacco: Never Used  . Alcohol use No  . Drug use: No  . Sexual activity: Not Currently    Do you feel safe at home?  Yes Are there smokers in your home (other than you)? No  Dietary issues and exercise activities: Exercise limited by: orthopedic condition(s)  Current Dietary habits:  Tried to limit sugar intake.  Eating lots of vegetables currently, mostly canned.   Objective:    Today's Vitals   01/14/17 1445  BP: (!) 92/52  Pulse: 68  Weight: 215 lb (97.5 kg)  Height: 5\' 10"  (1.778 m)  PainSc: 5    Body mass index is 30.85 kg/m.  Activities of Daily Living In your present state of health, do you have any difficulty performing the following activities: 01/14/2017  Hearing? N  Vision? N  Difficulty concentrating or making decisions? N  Walking or climbing stairs? N  Dressing or bathing?  N  Doing errands, shopping? N  Preparing Food and eating ? N  Using the Toilet? N  In the past six months, have you accidently leaked urine? N  Do you have problems with loss of bowel control? N  Managing your Medications? N  Managing your Finances? N  Housekeeping or managing your Housekeeping? N  Some recent data might be hidden     Cardiac Risk Factors include: advanced age (>37men, >13 women);diabetes mellitus;dyslipidemia;hypertension;microalbuminuria;obesity (BMI >30kg/m2);sedentary lifestyle  Depression Screen PHQ 2/9 Scores 01/14/2017 10/25/2016 07/09/2016 03/06/2016  PHQ - 2 Score 0 0 0 3  PHQ- 9 Score 2 - - 5     Fall Risk Fall Risk  01/14/2017 10/25/2016 07/09/2016 03/06/2016 01/27/2016  Falls in the past year? Yes Yes No No No  Number falls in past yr: 1 1 - - -  Injury with Fall? Yes No - - -  Risk for fall due to : - - - - -  Risk for fall due to: Comment - - - - -  Follow up Falls prevention discussed - - - -   Fall Assessment - increased risk due to hypoglycemia and hypotension.   Cognitive Function: MMSE - Mini Mental State Exam 01/14/2017 12/14/2015 08/20/2014  Orientation to time 5 5 5   Orientation to Place 5 5 5   Registration 3 3 3   Attention/ Calculation 1 0 1  Attention/Calculation-comments - patient unable to complete due to decreased literacy -  Recall 3 3 3   Language- name 2 objects 2 2 2   Language- repeat 1 1 1   Language- follow 3 step command 3 3 3   Language- read & follow direction 1 1 0  Language-read & follow direction-comments - - patient cannot read  Write a sentence 1 0 0  Write a sentence-comments - pt unable to complete due to decreased literacy patient cannot read  Copy design 1 1 1   Total score 26 24 24     Immunizations and Health Maintenance Immunization History  Administered Date(s) Administered  . Influenza Split 03/13/2011  . Influenza Whole 03/11/2009, 02/28/2010  . Influenza,inj,Quad PF,36+ Mos 04/14/2013, 04/16/2014, 05/02/2015,  03/06/2016  .  Influenza-Unspecified 03/19/2012  . Pneumococcal Conjugate-13 08/20/2014  . Pneumococcal Polysaccharide-23 03/19/2012  . Td 08/29/2009  . Zoster 01/11/2015   Health Maintenance Due  Topic Date Due  . DEXA SCAN  08/10/2016  . INFLUENZA VACCINE  01/09/2017    Patient Care Team: Junie Spencer, FNP as PCP - General (Nurse Practitioner) Marinus Maw, MD as Consulting Physician (Cardiology) Louis Meckel, MD as Consulting Physician (Gastroenterology) Tonny Bollman, MD as Consulting Physician (Cardiology)  Indicate any recent Medical Services you may have received from other than [facility] providers in the past year (date may be approximate).    Assessment:    Annual Wellness Visit  Left breast pain.  Dizziness / hypotension / hypoglycemia Medication management.   Screening Tests Health Maintenance  Topic Date Due  . DEXA SCAN  08/10/2016  . INFLUENZA VACCINE  01/09/2017  . OPHTHALMOLOGY EXAM  03/07/2017  . HEMOGLOBIN A1C  04/27/2017  . FOOT EXAM  10/25/2017  . TETANUS/TDAP  08/30/2019  . PNA vac Low Risk Adult  Completed        Plan:   During the course of the visit [name] [name] was educated and counseled about the following appropriate screening and preventive services:   Vaccines to include Pneumoccal, Influenza, Td and Shingles - UTD  Colorectal cancer screening - past age requirement  Cardiovascular disease screening  Diabetes - decreased Humulin N to 8 unit bid  Reviewed s/s of hypoglycemia and proper treatment  Bone Denisty / Osteoporosis Screening - referral sent  Mammogram - recommended since having pain in left breast - referral sent for diagnostic  Glaucoma screening / Diabetic Eye Exam - reminded that exam is due in September 2018  Nutrition counseling  Advanced Directives  Physical Activity  D/C generic Lotrel 10/20.  Restart metoprolol 50mg  (due to CHF) take 1/2 tablet daily.  Continue lisinopril 10mg  qd.   RTC in 2 weeks to see  PCP to follow up DM and HTN  Orders Placed This Encounter  Procedures  . DG [facility] DEXA    Standing Status:   Future    Standing Expiration Date:   03/16/2018    Scheduling Instructions:     Last DEXA 08/2014    Order Specific Question:   Reason for Exam (SYMPTOM  OR DIAGNOSIS REQUIRED)    Answer:   post menopausal female  . MM Digital Diagnostic Bilat    Order Specific Question:   Reason for Exam (SYMPTOM  OR DIAGNOSIS REQUIRED)    Answer:   left breast pain    Order Specific Question:   Preferred imaging location?    Answer:   [facility]  . Thyroid Panel With TSH  . Microalbumin / creatinine urine ratio      Patient Instructions (the written plan) were given to the patient.   Henrene Pastor, PharmD   01/15/2017

## 2017-01-14 NOTE — Patient Instructions (Addendum)
  [name] , Thank you for taking time to come for your Medicare Wellness Visit. I appreciate your ongoing commitment to your health goals. Please review the following plan we discussed and let me know if I can assist you in the future.   These are the goals we discussed:  Take metoprolol ER 50mg  tablet - take 1/2 tablet once a day with food  Stop amlodipine / benazepril 10/40mg    Continue to take lisinopril 10mg  - take 1 tablet daily  Decrease NPH insulin to 8 units - inject subcutaneously twice a day with breakfast and supper.   Try do chair exercise daily.   This is a list of the screening recommended for you and due dates:  Health Maintenance  Topic Date Due  . DEXA scan (bone density measurement)  08/10/2016  . Flu Shot  01/09/2017  . Eye exam for diabetics  03/07/2017  . Hemoglobin A1C  04/27/2017  . Complete foot exam   10/25/2017  . Tetanus Vaccine  08/30/2019  . Pneumonia vaccines  Completed

## 2017-01-15 ENCOUNTER — Other Ambulatory Visit: Payer: Self-pay | Admitting: Family

## 2017-01-15 DIAGNOSIS — N644 Mastodynia: Secondary | ICD-10-CM

## 2017-01-15 LAB — THYROID PANEL WITH TSH
FREE THYROXINE INDEX: 1.7 (ref 1.2–4.9)
T3 UPTAKE RATIO: 26 % (ref 24–39)
T4 TOTAL: 6.4 ug/dL (ref 4.5–12.0)
TSH: 8.79 u[IU]/mL — ABNORMAL HIGH (ref 0.450–4.500)

## 2017-01-15 LAB — MICROALBUMIN / CREATININE URINE RATIO
CREATININE, UR: 57.3 mg/dL
MICROALBUM., U, RANDOM: 24.8 ug/mL
Microalb/Creat Ratio: 43.3 mg/g creat — ABNORMAL HIGH (ref 0.0–30.0)

## 2017-01-16 ENCOUNTER — Other Ambulatory Visit: Payer: Self-pay | Admitting: Family

## 2017-01-23 ENCOUNTER — Other Ambulatory Visit: Payer: Self-pay | Admitting: Family

## 2017-01-24 ENCOUNTER — Other Ambulatory Visit: Payer: Medicare Other

## 2017-01-28 ENCOUNTER — Ambulatory Visit (INDEPENDENT_AMBULATORY_CARE_PROVIDER_SITE_OTHER): Payer: Medicare Other | Admitting: Family

## 2017-01-28 ENCOUNTER — Other Ambulatory Visit: Payer: Self-pay | Admitting: Family

## 2017-01-28 ENCOUNTER — Ambulatory Visit: Payer: Medicare Other

## 2017-01-28 ENCOUNTER — Encounter: Payer: Self-pay | Admitting: Family

## 2017-01-28 VITALS — BP 130/66 | HR 61 | Temp 97.0°F | Ht 70.0 in | Wt 216.0 lb

## 2017-01-28 DIAGNOSIS — E1143 Type 2 diabetes mellitus with diabetic autonomic (poly)neuropathy: Secondary | ICD-10-CM | POA: Diagnosis not present

## 2017-01-28 DIAGNOSIS — I739 Peripheral vascular disease, unspecified: Secondary | ICD-10-CM

## 2017-01-28 DIAGNOSIS — I5032 Chronic diastolic (congestive) heart failure: Secondary | ICD-10-CM

## 2017-01-28 DIAGNOSIS — M545 Low back pain, unspecified: Secondary | ICD-10-CM

## 2017-01-28 DIAGNOSIS — E039 Hypothyroidism, unspecified: Secondary | ICD-10-CM

## 2017-01-28 DIAGNOSIS — E1142 Type 2 diabetes mellitus with diabetic polyneuropathy: Secondary | ICD-10-CM

## 2017-01-28 DIAGNOSIS — E785 Hyperlipidemia, unspecified: Secondary | ICD-10-CM | POA: Diagnosis not present

## 2017-01-28 DIAGNOSIS — F411 Generalized anxiety disorder: Secondary | ICD-10-CM

## 2017-01-28 DIAGNOSIS — M1710 Unilateral primary osteoarthritis, unspecified knee: Secondary | ICD-10-CM

## 2017-01-28 DIAGNOSIS — K219 Gastro-esophageal reflux disease without esophagitis: Secondary | ICD-10-CM

## 2017-01-28 DIAGNOSIS — M25561 Pain in right knee: Secondary | ICD-10-CM

## 2017-01-28 DIAGNOSIS — I10 Essential (primary) hypertension: Secondary | ICD-10-CM | POA: Diagnosis not present

## 2017-01-28 DIAGNOSIS — I495 Sick sinus syndrome: Secondary | ICD-10-CM

## 2017-01-28 DIAGNOSIS — IMO0002 Reserved for concepts with insufficient information to code with codable children: Secondary | ICD-10-CM

## 2017-01-28 DIAGNOSIS — J449 Chronic obstructive pulmonary disease, unspecified: Secondary | ICD-10-CM

## 2017-01-28 DIAGNOSIS — G6289 Other specified polyneuropathies: Secondary | ICD-10-CM

## 2017-01-28 DIAGNOSIS — M549 Dorsalgia, unspecified: Secondary | ICD-10-CM

## 2017-01-28 DIAGNOSIS — G8929 Other chronic pain: Secondary | ICD-10-CM

## 2017-01-28 LAB — BAYER DCA HB A1C WAIVED: HB A1C: 6.2 % (ref <7.0)

## 2017-01-28 MED ORDER — HYDROCODONE-ACETAMINOPHEN 5-325 MG PO TABS: 1.0000 | ORAL_TABLET | Freq: Four times a day (QID) | ORAL | 0 refills | Status: DC | PRN

## 2017-01-28 MED ORDER — HYDROCODONE-ACETAMINOPHEN 5-325 MG PO TABS: ORAL_TABLET | ORAL | 0 refills | Status: DC

## 2017-01-28 MED ORDER — ALPRAZOLAM 0.5 MG PO TABS: 0.5000 mg | ORAL_TABLET | Freq: Two times a day (BID) | ORAL | 5 refills | Status: DC | PRN

## 2017-01-28 NOTE — Telephone Encounter (Signed)
RX printed. Pt has appt today.

## 2017-01-28 NOTE — Telephone Encounter (Signed)
Christy - last seen in [facility] by you. - please address refills - route to nurse for phone in

## 2017-01-28 NOTE — Progress Notes (Signed)
Subjective:    Patient ID: [name] [name], female    DOB: 18-Mar-1933, 83 y.o.   MRN: [id number]  Pt presents to the office today for chronic follow up. Pt has pacemaker for sinoatrial node dysfunction, CHF, PVD, and is followed by her Cardiologist every 6 months.  Congestive Heart Failure  Presents for follow-up visit. Associated symptoms include fatigue and palpitations. Pertinent negatives include no edema. The symptoms have been stable.  Hypertension  This is a chronic problem. The current episode started more than 1 year ago. The problem has been resolved since onset. The problem is controlled. Associated symptoms include anxiety, malaise/fatigue and palpitations. Pertinent negatives include no peripheral edema. Risk factors for coronary artery disease include dyslipidemia, obesity, post-menopausal state, sedentary lifestyle and family history. The current treatment provides moderate improvement. Hypertensive end-organ damage includes CAD/MI and heart failure. There is no history of CVA. Identifiable causes of hypertension include a thyroid problem.  Hyperlipidemia  This is a chronic problem. The current episode started more than 1 year ago. The problem is controlled. Recent lipid tests were reviewed and are normal. Exacerbating diseases include obesity. Current antihyperlipidemic treatment includes statins. The current treatment provides moderate improvement of lipids. Risk factors for coronary artery disease include diabetes mellitus, post-menopausal, a sedentary lifestyle, hypertension, obesity, family history and dyslipidemia.  Gastroesophageal Reflux  She complains of a hoarse voice. She reports no belching or no coughing. This is a chronic problem. The current episode started more than 1 year ago. The problem occurs occasionally. The problem has been waxing and waning. The symptoms are aggravated by certain foods. Associated symptoms include fatigue. She has tried a PPI for the symptoms. The  treatment provided significant relief.  Thyroid Problem  Presents for follow-up visit. Symptoms include anxiety, constipation, depressed mood, fatigue, hoarse voice and palpitations. The symptoms have been worsening. Her past medical history is significant for heart failure and hyperlipidemia.  Anxiety  Presents for follow-up visit. Symptoms include depressed mood, excessive worry, irritability, nervous/anxious behavior and palpitations. Symptoms occur occasionally.    Back Pain  This is a chronic problem. The current episode started more than 1 year ago. The problem occurs constantly. The problem has been waxing and waning since onset. The pain is present in the lumbar spine. The quality of the pain is described as aching. The pain is at a severity of 9/10. The pain is moderate.  COPD PT uses Symbicort BID and uses albuterol as needed for wheezing.  Diabetic Neuropathy  Pt taking gabapentin for bilateral foot pain and numbness. States has intermittent pain of 5 out 10.    Review of Systems  Constitutional: Positive for fatigue, irritability and malaise/fatigue.  HENT: Positive for hoarse voice.   Respiratory: Negative for cough.   Cardiovascular: Positive for palpitations.  Gastrointestinal: Positive for constipation.  Musculoskeletal: Positive for back pain.  Psychiatric/Behavioral: The patient is nervous/anxious.   All other systems reviewed and are negative.      Objective:   Physical Exam  Constitutional: She is oriented to person, place, and time. She appears well-developed and well-nourished. No distress.  HENT:  Head: Normocephalic and atraumatic.  Right Ear: External ear normal.  Left Ear: External ear normal.  Nose: Nose normal.  Mouth/Throat: Oropharynx is clear and moist.  Eyes: Pupils are equal, round, and reactive to light.  Neck: Normal range of motion. Neck supple. No thyromegaly present.  Cardiovascular: Normal rate, regular rhythm, normal heart sounds and  intact distal pulses.   No murmur heard.  Pulmonary/Chest: Effort normal and breath sounds normal. No respiratory distress. She has no wheezes.  Abdominal: Soft. Bowel sounds are normal. She exhibits no distension. There is no tenderness.  Musculoskeletal: Normal range of motion. She exhibits no edema or tenderness.  Neurological: She is alert and oriented to person, place, and time.  Skin: Skin is warm and dry.  Psychiatric: She has a normal mood and affect. Her behavior is normal. Judgment and thought content normal.  Vitals reviewed.    BP 130/66   Pulse 61   Temp (!) 97 F (36.1 C) (Oral)   Ht _0  (1.778 m)   Wt 216 lb (98 kg)   BMI 30.99 kg/m      Assessment & Plan:  1. Essential hypertension - CMP14+EGFR  2. Chronic diastolic heart failure (HCC) - CMP14+EGFR  3. Chronic obstructive pulmonary disease, unspecified COPD type (HCC) - CMP14+EGFR  4. Gastroesophageal reflux disease, esophagitis presence not specified - CMP14+EGFR  5. Hypothyroidism, unspecified typ - CMP14+EGFR - TSH  6. Type II diabetes mellitus with peripheral autonomic neuropathy (HCC) - Bayer DCA Hb A1c Waived - CMP14+EGFR  7. Diabetic polyneuropathy associated with type 2 diabetes mellitus (HCC) - Bayer DCA Hb A1c Waived - CMP14+EGFR  8. Osteoarthrosis involving lower leg - CMP14+EGFR  9. Morbid obesity ([place]) - CMP14+EGFR  10. Hyperlipidemia, unspecified hyperlipidemia type - CMP14+EGFR - Lipid panel  11. GAD (generalized anxiety disorder)  - ALPRAZolam (XANAX) 0.5 MG tablet; Take 1 tablet (0.5 mg total) by mouth 2 (two) times daily as needed for anxiety.  Dispense: 60 tablet; Refill: 5 - CMP14+EGFR  12. PVD (peripheral vascular disease) (HCC) - CMP14+EGFR  13. Sinoatrial node dysfunction (HCC) - CMP14+EGFR  14. Chronic bilateral low back pain without sciatica - CMP14+EGFR  15. Other polyneuropathy - HYDROcodone-acetaminophen (NORCO/VICODIN) 5-325 MG tablet; TAKE (1)  TABLET EVERY SIX HOURS AS NEEDED FOR MODERATE PAIN  Dispense: 60 tablet; Refill: 0 - HYDROcodone-acetaminophen (NORCO/VICODIN) 5-325 MG tablet; Take 1 tablet by mouth every 6 (six) hours as needed for moderate pain.  Dispense: 60 tablet; Refill: 0 - HYDROcodone-acetaminophen (NORCO/VICODIN) 5-325 MG tablet; Take 1 tablet by mouth every 6 (six) hours as needed for moderate pain.  Dispense: 60 tablet; Refill: 0 - CMP14+EGFR  16. Chronic pain of right knee  - HYDROcodone-acetaminophen (NORCO/VICODIN) 5-325 MG tablet; TAKE (1) TABLET EVERY SIX HOURS AS NEEDED FOR MODERATE PAIN  Dispense: 60 tablet; Refill: 0 - CMP14+EGFR   Continue all meds Labs pending Health Maintenance reviewed Diet and exercise encouraged RTO 3 months   Evelina Dun, FNP

## 2017-01-28 NOTE — Patient Instructions (Signed)

## 2017-01-29 ENCOUNTER — Ambulatory Visit (HOSPITAL_COMMUNITY)
Admission: RE | Admit: 2017-01-29 | Discharge: 2017-01-29 | Disposition: A | Discharge status: Home / Self Care | Payer: Medicare Other | Source: Ambulatory Visit | Attending: Family | Admitting: Family

## 2017-01-29 ENCOUNTER — Other Ambulatory Visit: Payer: Self-pay | Admitting: Family

## 2017-01-29 DIAGNOSIS — R928 Other abnormal and inconclusive findings on diagnostic imaging of breast: Secondary | ICD-10-CM | POA: Diagnosis not present

## 2017-01-29 DIAGNOSIS — N644 Mastodynia: Secondary | ICD-10-CM | POA: Insufficient documentation

## 2017-01-29 DIAGNOSIS — N6489 Other specified disorders of breast: Secondary | ICD-10-CM | POA: Diagnosis not present

## 2017-01-29 LAB — CMP14+EGFR
ALBUMIN: 4.1 g/dL (ref 3.5–4.7)
ALK PHOS: 71 IU/L (ref 39–117)
ALT: 16 IU/L (ref 0–32)
AST: 23 IU/L (ref 0–40)
Albumin/Globulin Ratio: 1.7 (ref 1.2–2.2)
BUN / CREAT RATIO: 16 (ref 12–28)
BUN: 16 mg/dL (ref 8–27)
Bilirubin Total: 0.2 mg/dL (ref 0.0–1.2)
CO2: 25 mmol/L (ref 20–29)
CREATININE: 1.01 mg/dL — AB (ref 0.57–1.00)
Calcium: 9.9 mg/dL (ref 8.7–10.3)
Chloride: 100 mmol/L (ref 96–106)
GFR calc Af Amer: 60 mL/min/{1.73_m2} (ref >59)
GFR calc non Af Amer: 52 mL/min/{1.73_m2} — ABNORMAL LOW (ref >59)
GLUCOSE: 96 mg/dL (ref 65–99)
Globulin, Total: 2.4 g/dL (ref 1.5–4.5)
Potassium: 5 mmol/L (ref 3.5–5.2)
Sodium: 138 mmol/L (ref 134–144)
Total Protein: 6.5 g/dL (ref 6.0–8.5)

## 2017-01-29 LAB — LIPID PANEL
CHOLESTEROL TOTAL: 126 mg/dL (ref 100–199)
Chol/HDL Ratio: 2.5 ratio (ref 0.0–4.4)
HDL: 51 mg/dL (ref >39)
LDL Calculated: 53 mg/dL (ref 0–99)
TRIGLYCERIDES: 108 mg/dL (ref 0–149)
VLDL CHOLESTEROL CAL: 22 mg/dL (ref 5–40)

## 2017-01-29 LAB — TSH: TSH: 9.79 u[IU]/mL — ABNORMAL HIGH (ref 0.450–4.500)

## 2017-01-29 MED ORDER — LEVOTHYROXINE SODIUM 25 MCG PO TABS: 25.0000 ug | ORAL_TABLET | Freq: Every day | ORAL | 1 refills | Status: DC

## 2017-01-30 ENCOUNTER — Other Ambulatory Visit: Payer: Self-pay | Admitting: Family

## 2017-02-06 ENCOUNTER — Other Ambulatory Visit: Payer: Self-pay | Admitting: Family

## 2017-02-15 ENCOUNTER — Other Ambulatory Visit: Payer: Self-pay | Admitting: Family

## 2017-02-26 ENCOUNTER — Other Ambulatory Visit: Payer: Self-pay | Admitting: Family

## 2017-02-26 DIAGNOSIS — F411 Generalized anxiety disorder: Secondary | ICD-10-CM

## 2017-03-01 ENCOUNTER — Telehealth: Payer: Self-pay | Admitting: Family

## 2017-03-01 MED ORDER — ONDANSETRON 4 MG PO TBDP: 4.0000 mg | ORAL_TABLET | Freq: Three times a day (TID) | ORAL | 0 refills | Status: DC | PRN

## 2017-03-01 NOTE — Telephone Encounter (Signed)
Pt c/o of diarrhea and stomach pain and throwing up denies fever x 3 days. Has not tried anything otc. Patient states she is afraid to drive to come in. Please advise

## 2017-03-01 NOTE — Telephone Encounter (Signed)
Aware of script for zofran.   She says her blood  sugar is at 60 because she can not keep food down.  Advised her to eat some sweet food.  Advised she may need EMS but she refused.  She says a friend is coming over today to check on her but all family either live out of town or on vacation. Advised her to please call for help if things do not improve.  We are open in the Am also.

## 2017-03-01 NOTE — Telephone Encounter (Signed)
zofran  Prescription sent to pharmacy   

## 2017-03-07 ENCOUNTER — Other Ambulatory Visit: Payer: Self-pay | Admitting: Family

## 2017-03-09 ENCOUNTER — Other Ambulatory Visit: Payer: Self-pay | Admitting: Family

## 2017-03-18 ENCOUNTER — Other Ambulatory Visit: Payer: Self-pay | Admitting: Family

## 2017-03-29 ENCOUNTER — Other Ambulatory Visit: Payer: Self-pay | Admitting: Family

## 2017-03-29 ENCOUNTER — Ambulatory Visit (INDEPENDENT_AMBULATORY_CARE_PROVIDER_SITE_OTHER): Payer: Medicare Other | Admitting: Family

## 2017-03-29 ENCOUNTER — Encounter: Payer: Self-pay | Admitting: Family

## 2017-03-29 VITALS — BP 127/63 | HR 82 | Temp 97.1°F | Ht 70.0 in | Wt 205.6 lb

## 2017-03-29 DIAGNOSIS — Z23 Encounter for immunization: Secondary | ICD-10-CM | POA: Diagnosis not present

## 2017-03-29 DIAGNOSIS — R634 Abnormal weight loss: Secondary | ICD-10-CM | POA: Diagnosis not present

## 2017-03-29 DIAGNOSIS — K2100 Gastro-esophageal reflux disease with esophagitis, without bleeding: Secondary | ICD-10-CM

## 2017-03-29 DIAGNOSIS — W57XXXA Bitten or stung by nonvenomous insect and other nonvenomous arthropods, initial encounter: Secondary | ICD-10-CM

## 2017-03-29 DIAGNOSIS — R21 Rash and other nonspecific skin eruption: Secondary | ICD-10-CM | POA: Diagnosis not present

## 2017-03-29 MED ORDER — OMEPRAZOLE 20 MG PO CPDR: 20.0000 mg | DELAYED_RELEASE_CAPSULE | Freq: Every day | ORAL | 1 refills | Status: DC

## 2017-03-29 NOTE — Patient Instructions (Addendum)
Bedbugs Bedbugs are tiny bugs that live in and around beds. They stay hidden during the day, and they come out at night and bite. Bedbugs need blood to live and grow. Where are bedbugs found? Bedbugs can be found anywhere, whether a place is clean or dirty. They are most often found in places where many people come and go, such as hotels, shelters, dorms, and health care settings. It is also common for them to be found in homes where there are many birds or bats nearby. What are bedbug bites like? A bedbug bite leaves a small red bump with a darker red dot in the middle. The bump may appear soon after a person is bitten or a day or more later. Bedbug bites usually do not hurt, but they may itch. Most people do not need treatment for bedbug bites. The bumps usually go away on their own in a few days. How do I check for bedbugs? Bedbugs are reddish-brown, oval, and flat. They range in size from 1 mm to 7 mm and they cannot fly. Look for bedbugs in these places:  On mattresses, bed frames, headboards, and box springs.  On drapes and curtains in bedrooms.  Under carpeting in bedrooms.  Behind electrical outlets.  Behind any wallpaper that is peeling.  Inside luggage.  Also look for black or red spots or stains on or near the bed. Stains can come from bedbugs that have been crushed or from bedbug waste. What should I do if I find bedbugs? When Traveling If you find bedbugs while traveling, check all of your possessions carefully before you bring them into your home. Consider throwing away anything that has bedbugs on it. At Home If you find bedbugs at home, your bedroom may need to be treated by a pest control expert. You may also need to throw away mattresses or luggage. To help keep bedbugs from coming back, consider taking these actions:  Put a plastic cover over your mattress.  Wash your clothes and bedding in water that is hotter than 120F (48.9C) and dry them on a hot setting.  Bedbugs are killed by high temperatures.  Vacuum often around the bed and in all of the cracks and crevices where the bugs might hide.  Carefully check all used furniture, bedding, or clothes that you bring into your home.  Eliminate bird nests and bat roosts that are near your home.  In Your Bed If you find bedbugs in your bed, consider wearing pajamas that have long sleeves and pant legs. Bedbugs usually bite areas of the skin that are not covered. This information is not intended to replace advice given to you by your health care provider. Make sure you discuss any questions you have with your health care provider. Document Released: 06/30/2010 Document Revised: 11/03/2015 Document Reviewed: 05/24/2014 Elsevier Interactive Patient Education  2018 [employer]   Heartburn Heartburn is a type of pain or discomfort that can happen in the throat or chest. It is often described as a burning pain. It may also cause a bad taste in the mouth. Heartburn may feel worse when you lie down or bend over, and it is often worse at night. Heartburn may be caused by stomach contents that move back up into the esophagus (reflux). Follow these instructions at home: Take these actions to decrease your discomfort and to help avoid complications. Diet  Follow a diet as recommended by your health care provider. This may involve avoiding foods and drinks such as: ?  Coffee and tea (with or without caffeine). ? Drinks that contain alcohol. ? Energy drinks and sports drinks. ? Carbonated drinks or sodas. ? Chocolate and cocoa. ? Peppermint and mint flavorings. ? Garlic and onions. ? Horseradish. ? Spicy and acidic foods, including peppers, chili powder, curry powder, vinegar, hot sauces, and barbecue sauce. ? Citrus fruit juices and citrus fruits, such as oranges, lemons, and limes. ? Tomato-based foods, such as red sauce, chili, salsa, and pizza with red sauce. ? Fried and fatty foods, such as donuts,  french fries, potato chips, and high-fat dressings. ? High-fat meats, such as hot dogs and fatty cuts of red and white meats, such as rib eye steak, sausage, ham, and bacon. ? High-fat dairy items, such as whole milk, butter, and cream cheese.  Eat small, frequent meals instead of large meals.  Avoid drinking large amounts of liquid with your meals.  Avoid eating meals during the 2-3 hours before bedtime.  Avoid lying down right after you eat.  Do not exercise right after you eat. General instructions  Pay attention to any changes in your symptoms.  Take over-the-counter and prescription medicines only as told by your health care provider. Do not take aspirin, ibuprofen, or other NSAIDs unless your health care provider told you to do so.  Do not use any tobacco products, including cigarettes, chewing tobacco, and e-cigarettes. If you need help quitting, ask your health care provider.  Wear loose-fitting clothing. Do not wear anything tight around your waist that causes pressure on your abdomen.  Raise (elevate) the head of your bed about 6 inches (15 cm).  Try to reduce your stress, such as with yoga or meditation. If you need help reducing stress, ask your health care provider.  If you are overweight, reduce your weight to an amount that is healthy for you. Ask your health care provider for guidance about a safe weight loss goal.  Keep all follow-up visits as told by your health care provider. This is important. Contact a health care provider if:  You have new symptoms.  You have unexplained weight loss.  You have difficulty swallowing, or it hurts to swallow.  You have wheezing or a persistent cough.  Your symptoms do not improve with treatment.  You have frequent heartburn for more than two weeks. Get help right away if:  You have pain in your arms, neck, jaw, teeth, or back.  You feel sweaty, dizzy, or light-headed.  You have chest pain or shortness of  breath.  You vomit and your vomit looks like blood or coffee grounds.  Your stool is bloody or black. This information is not intended to replace advice given to you by your health care provider. Make sure you discuss any questions you have with your health care provider. Document Released: 10/14/2008 Document Revised: 11/03/2015 Document Reviewed: 09/22/2014 Elsevier Interactive Patient Education  2017 [employer].

## 2017-03-29 NOTE — Progress Notes (Signed)
   Subjective:    Patient ID: [name] [name], female    DOB: 1933/05/02, 83 y.o.   MRN: [id number]  Pt presents to the office today with weight loss of 11 pounds since her last visit on 01/28/17. Pt states she has lost a total of 67 pounds over the last 5 years.   According to EPIC , 2-3 years ago pt has lost 30 lbs. Pt states she has cut out "sweets" and she is trying to control her DM.  PT complaining of GERD symptoms.  Pt complaining of bedbugs at her home. States she is looking for an Conservation officer, nature.  Gastroesophageal Reflux  She complains of dysphagia, heartburn, a hoarse voice and a sore throat. This is a new problem. The current episode started more than 1 year ago. The problem occurs frequently. The problem has been waxing and waning. The symptoms are aggravated by certain foods. Risk factors include obesity. She has tried nothing for the symptoms. The treatment provided no relief.       Review of Systems  HENT: Positive for hoarse voice and sore throat.   Gastrointestinal: Positive for dysphagia and heartburn.  Skin: Positive for rash.  All other systems reviewed and are negative.      Objective:   Physical Exam  Constitutional: She is oriented to person, place, and time. She appears well-developed and well-nourished. No distress.  HENT:  Head: Normocephalic.  Eyes: Pupils are equal, round, and reactive to light.  Neck: Normal range of motion. Neck supple. No thyromegaly present.  Cardiovascular: Normal rate, regular rhythm, normal heart sounds and intact distal pulses.   No murmur heard. Pulmonary/Chest: Effort normal and breath sounds normal. No respiratory distress. She has no wheezes.  Abdominal: Soft. Bowel sounds are normal. She exhibits no distension. There is no tenderness.  Musculoskeletal: Normal range of motion. She exhibits no edema or tenderness.  Neurological: She is alert and oriented to person, place, and time.  Skin: Skin is warm and dry. Rash (erythemas  rash in groin) noted.  Psychiatric: She has a normal mood and affect. Her behavior is normal. Judgment and thought content normal.  Vitals reviewed.     BP 127/63   Pulse 82   Temp (!) 97.1 F (36.2 C) (Oral)   Ht '5\' 10"'$  (1.778 m)   Wt 205 lb 9.6 oz (93.3 kg)   BMI 29.50 kg/m      Assessment & Plan:  1. Weight loss - I believe this is related to her diet changes. - CMP14+EGFR - Thyroid Panel With TSH - Anemia Profile B  2. Gastroesophageal reflux disease with esophagitis Pt started on Prilosec 20 mg today -Diet discussed- Avoid fried, spicy, citrus foods, caffeine and alcohol -Do not eat 2-3 hours before bedtime -Encouraged small frequent meals -Avoid NSAID's - CMP14+EGFR - omeprazole (PRILOSEC) 20 MG capsule; Take 1 capsule (20 mg total) by mouth daily.  Dispense: 90 capsule; Refill: 1  3. Bedbug bite, initial encounter Discussed sleeping with long clothing Getting exterminator  Vacuum all beds Wash all bedding and curtains in hot water - [place], FNP

## 2017-03-30 LAB — ANEMIA PROFILE B
BASOS: 0 %
Basophils Absolute: 0 10*3/uL (ref 0.0–0.2)
EOS (ABSOLUTE): 0.1 10*3/uL (ref 0.0–0.4)
Eos: 2 %
FERRITIN: 71 ng/mL (ref 15–150)
HEMOGLOBIN: 13 g/dL (ref 11.1–15.9)
Hematocrit: 39.4 % (ref 34.0–46.6)
IMMATURE GRANS (ABS): 0 10*3/uL (ref 0.0–0.1)
Immature Granulocytes: 0 %
Iron Saturation: 17 % (ref 15–55)
Iron: 53 ug/dL (ref 27–139)
LYMPHS ABS: 2.2 10*3/uL (ref 0.7–3.1)
LYMPHS: 31 %
MCH: 30 pg (ref 26.6–33.0)
MCHC: 33 g/dL (ref 31.5–35.7)
MCV: 91 fL (ref 79–97)
MONOS ABS: 0.4 10*3/uL (ref 0.1–0.9)
Monocytes: 6 %
NEUTROS PCT: 61 %
Neutrophils Absolute: 4.4 10*3/uL (ref 1.4–7.0)
Platelets: 235 10*3/uL (ref 150–379)
RBC: 4.34 x10E6/uL (ref 3.77–5.28)
RDW: 13.2 % (ref 12.3–15.4)
Retic Ct Pct: 1.8 % (ref 0.6–2.6)
Total Iron Binding Capacity: 305 ug/dL (ref 250–450)
UIBC: 252 ug/dL (ref 118–369)
Vitamin B-12: 218 pg/mL — ABNORMAL LOW (ref 232–1245)
WBC: 7.2 10*3/uL (ref 3.4–10.8)

## 2017-03-30 LAB — CMP14+EGFR
ALBUMIN: 4 g/dL (ref 3.5–4.7)
ALT: 20 IU/L (ref 0–32)
AST: 26 IU/L (ref 0–40)
Albumin/Globulin Ratio: 1.9 (ref 1.2–2.2)
Alkaline Phosphatase: 59 IU/L (ref 39–117)
BUN/Creatinine Ratio: 11 — ABNORMAL LOW (ref 12–28)
BUN: 11 mg/dL (ref 8–27)
Bilirubin Total: 0.4 mg/dL (ref 0.0–1.2)
CO2: 24 mmol/L (ref 20–29)
CREATININE: 0.97 mg/dL (ref 0.57–1.00)
Calcium: 9.5 mg/dL (ref 8.7–10.3)
Chloride: 100 mmol/L (ref 96–106)
GFR calc Af Amer: 63 mL/min/{1.73_m2} (ref >59)
GFR, EST NON AFRICAN AMERICAN: 55 mL/min/{1.73_m2} — AB (ref >59)
GLOBULIN, TOTAL: 2.1 g/dL (ref 1.5–4.5)
Glucose: 100 mg/dL — ABNORMAL HIGH (ref 65–99)
POTASSIUM: 4.5 mmol/L (ref 3.5–5.2)
Sodium: 141 mmol/L (ref 134–144)
TOTAL PROTEIN: 6.1 g/dL (ref 6.0–8.5)

## 2017-03-30 LAB — THYROID PANEL WITH TSH
FREE THYROXINE INDEX: 2.2 (ref 1.2–4.9)
T3 UPTAKE RATIO: 28 % (ref 24–39)
T4 TOTAL: 7.9 ug/dL (ref 4.5–12.0)
TSH: 3.92 u[IU]/mL (ref 0.450–4.500)

## 2017-04-02 ENCOUNTER — Telehealth: Payer: Self-pay | Admitting: Family

## 2017-04-02 ENCOUNTER — Ambulatory Visit: Payer: Medicare Other

## 2017-04-02 NOTE — Telephone Encounter (Signed)
[email or web address]

## 2017-04-09 ENCOUNTER — Encounter: Payer: Self-pay | Admitting: *Deleted

## 2017-04-11 NOTE — Telephone Encounter (Signed)
Letter with results was mailed by nurse.

## 2017-04-16 ENCOUNTER — Other Ambulatory Visit: Payer: Self-pay | Admitting: Family

## 2017-04-30 ENCOUNTER — Other Ambulatory Visit: Payer: Self-pay | Admitting: Family

## 2017-05-14 ENCOUNTER — Encounter: Payer: Self-pay | Admitting: Family

## 2017-05-14 ENCOUNTER — Ambulatory Visit (INDEPENDENT_AMBULATORY_CARE_PROVIDER_SITE_OTHER): Payer: Medicare Other | Admitting: Family

## 2017-05-14 ENCOUNTER — Other Ambulatory Visit: Payer: Self-pay | Admitting: Family

## 2017-05-14 VITALS — BP 174/80 | HR 71 | Temp 96.6°F | Ht 70.0 in | Wt 199.0 lb

## 2017-05-14 DIAGNOSIS — K2100 Gastro-esophageal reflux disease with esophagitis, without bleeding: Secondary | ICD-10-CM

## 2017-05-14 DIAGNOSIS — M545 Low back pain, unspecified: Secondary | ICD-10-CM

## 2017-05-14 DIAGNOSIS — I152 Hypertension secondary to endocrine disorders: Secondary | ICD-10-CM

## 2017-05-14 DIAGNOSIS — F411 Generalized anxiety disorder: Secondary | ICD-10-CM | POA: Diagnosis not present

## 2017-05-14 DIAGNOSIS — E039 Hypothyroidism, unspecified: Secondary | ICD-10-CM | POA: Diagnosis not present

## 2017-05-14 DIAGNOSIS — E1142 Type 2 diabetes mellitus with diabetic polyneuropathy: Secondary | ICD-10-CM

## 2017-05-14 DIAGNOSIS — E1159 Type 2 diabetes mellitus with other circulatory complications: Secondary | ICD-10-CM

## 2017-05-14 DIAGNOSIS — G8929 Other chronic pain: Secondary | ICD-10-CM

## 2017-05-14 DIAGNOSIS — I10 Essential (primary) hypertension: Secondary | ICD-10-CM

## 2017-05-14 DIAGNOSIS — M25561 Pain in right knee: Secondary | ICD-10-CM | POA: Diagnosis not present

## 2017-05-14 DIAGNOSIS — E1143 Type 2 diabetes mellitus with diabetic autonomic (poly)neuropathy: Secondary | ICD-10-CM | POA: Diagnosis not present

## 2017-05-14 DIAGNOSIS — G6289 Other specified polyneuropathies: Secondary | ICD-10-CM

## 2017-05-14 DIAGNOSIS — I5032 Chronic diastolic (congestive) heart failure: Secondary | ICD-10-CM | POA: Diagnosis not present

## 2017-05-14 DIAGNOSIS — E1169 Type 2 diabetes mellitus with other specified complication: Secondary | ICD-10-CM | POA: Diagnosis not present

## 2017-05-14 DIAGNOSIS — E785 Hyperlipidemia, unspecified: Secondary | ICD-10-CM

## 2017-05-14 LAB — BAYER DCA HB A1C WAIVED: HB A1C: 6.3 % (ref <7.0)

## 2017-05-14 MED ORDER — HYDROCODONE-ACETAMINOPHEN 5-325 MG PO TABS: 1.0000 | ORAL_TABLET | Freq: Four times a day (QID) | ORAL | 0 refills | Status: DC | PRN

## 2017-05-14 MED ORDER — ALPRAZOLAM 0.5 MG PO TABS: 0.5000 mg | ORAL_TABLET | Freq: Every evening | ORAL | 5 refills | Status: DC | PRN

## 2017-05-14 MED ORDER — OMEPRAZOLE 20 MG PO CPDR: 20.0000 mg | DELAYED_RELEASE_CAPSULE | Freq: Two times a day (BID) | ORAL | 1 refills | Status: DC

## 2017-05-14 MED ORDER — HYDROCODONE-ACETAMINOPHEN 5-325 MG PO TABS
ORAL_TABLET | ORAL | 0 refills | Status: DC
Start: 2017-05-14 — End: 2017-06-28

## 2017-05-14 NOTE — Progress Notes (Signed)
Subjective:    Patient ID: [name] [name], female    DOB: 1933-02-04, 83 y.o.   MRN: [id number]  Pt presents to the office today for chronic follow up. Pt has pacemaker for sinoatrial node dysfunction, CHF, PVD, and is followed by her Cardiologist every 6 months.  Diabetes  She presents for her follow-up diabetic visit. She has type 2 diabetes mellitus. Her disease course has been stable. Hypoglycemia symptoms include nervousness/anxiousness. Associated symptoms include fatigue and foot paresthesias. Pertinent negatives for diabetes include no blurred vision and no foot ulcerations. Symptoms are stable. Diabetic complications include heart disease, nephropathy and peripheral neuropathy. Pertinent negatives for diabetic complications include no CVA. Risk factors for coronary artery disease include diabetes mellitus, dyslipidemia, hypertension, obesity, sedentary lifestyle, post-menopausal, stress and family history. She is following a generally healthy diet. Her breakfast blood glucose range is generally 110-130 mg/dl.  Thyroid Problem  Presents for follow-up visit. Symptoms include anxiety, fatigue and hoarse voice. Patient reports no constipation, depressed mood or diarrhea. The symptoms have been stable. Her past medical history is significant for hyperlipidemia.  Hyperlipidemia  This is a chronic problem. The current episode started more than 1 year ago. The problem is controlled. Recent lipid tests were reviewed and are normal. Exacerbating diseases include obesity. Current antihyperlipidemic treatment includes statins. The current treatment provides moderate improvement of lipids. Risk factors for coronary artery disease include diabetes mellitus, dyslipidemia, a sedentary lifestyle, post-menopausal, hypertension, obesity and family history.  Gastroesophageal Reflux  She complains of belching, coughing, heartburn and a hoarse voice. This is a chronic problem. The current episode started more than  1 year ago. The problem occurs frequently. The problem has been waxing and waning. Associated symptoms include fatigue. She has tried a PPI for the symptoms. The treatment provided mild relief.  Back Pain  This is a chronic problem. The current episode started more than 1 year ago. The problem occurs constantly. The problem has been waxing and waning since onset. The pain is present in the lumbar spine. The quality of the pain is described as aching. The pain is at a severity of 3/10. The pain is moderate.  Anxiety  Presents for follow-up visit. Symptoms include excessive worry, nervous/anxious behavior, panic and restlessness. Patient reports no depressed mood. Symptoms occur occasionally. The severity of symptoms is moderate. The quality of sleep is good.        Review of Systems  Constitutional: Positive for fatigue.  HENT: Positive for hoarse voice.   Eyes: Negative for blurred vision.  Respiratory: Positive for cough.   Gastrointestinal: Positive for heartburn. Negative for constipation and diarrhea.  Musculoskeletal: Positive for back pain.  Psychiatric/Behavioral: The patient is nervous/anxious.   All other systems reviewed and are negative.      Objective:   Physical Exam  Constitutional: She is oriented to person, place, and time. She appears well-developed and well-nourished. No distress.  HENT:  Head: Normocephalic and atraumatic.  Right Ear: External ear normal.  Left Ear: External ear normal.  Nose: Nose normal.  Mouth/Throat: Oropharynx is clear and moist.  Eyes: Pupils are equal, round, and reactive to light.  Neck: Normal range of motion. Neck supple. No thyromegaly present.  Cardiovascular: Normal rate, regular rhythm, normal heart sounds and intact distal pulses.  No murmur heard. Pulmonary/Chest: Effort normal and breath sounds normal. No respiratory distress. She has no wheezes.  Abdominal: Soft. Bowel sounds are normal. She exhibits no distension. There is no  tenderness.  Musculoskeletal: She exhibits tenderness (  pain in lower lumbar with flexion or extension). She exhibits no edema.  Neurological: She is alert and oriented to person, place, and time.  Skin: Skin is warm and dry.  Psychiatric: She has a normal mood and affect. Her behavior is normal. Judgment and thought content normal.  Vitals reviewed.     BP (!) 174/80   Pulse 71   Temp (!) 96.6 F (35.9 C) (Oral)   Ht '5\' 10"'  (1.778 m)   Wt 199 lb (90.3 kg)   BMI 28.55 kg/m      Assessment & Plan:  1. Type II diabetes mellitus with peripheral autonomic neuropathy (HCC) - Bayer DCA Hb A1c Waived - CMP14+EGFR  2. Diabetic polyneuropathy associated with type 2 diabetes mellitus (HCC) - Bayer DCA Hb A1c Waived - CMP14+EGFR  3. Hypertension associated with diabetes ([place]) - CMP14+EGFR  4. Chronic diastolic heart failure (HCC) - CMP14+EGFR  5. Hyperlipidemia associated with type 2 diabetes mellitus (HCC) - CMP14+EGFR - Lipid panel  6. Gastroesophageal reflux disease with esophagitis Prilosec increased to 20 mg BID from daily -Diet discussed- Avoid fried, spicy, citrus foods, caffeine and alcohol -Do not eat 2-3 hours before bedtime -Encouraged small frequent meals -Avoid NSAID's - CMP14+EGFR - omeprazole (PRILOSEC) 20 MG capsule; Take 1 capsule (20 mg total) by mouth 2 (two) times daily before a meal.  Dispense: 180 capsule; Refill: 1  7. Hypothyroidism, unspecified type - CMP14+EGFR - TSH  8. Chronic bilateral low back pain without sciatica - CMP14+EGFR  9. Other polyneuropathy - CMP14+EGFR - HYDROcodone-acetaminophen (NORCO/VICODIN) 5-325 MG tablet; TAKE (1) TABLET EVERY SIX HOURS AS NEEDED FOR MODERATE PAIN  Dispense: 60 tablet; Refill: 0 - HYDROcodone-acetaminophen (NORCO/VICODIN) 5-325 MG tablet; Take 1 tablet by mouth every 6 (six) hours as needed for moderate pain.  Dispense: 60 tablet; Refill: 0 - HYDROcodone-acetaminophen (NORCO/VICODIN) 5-325 MG tablet;  Take 1 tablet by mouth every 6 (six) hours as needed for moderate pain.  Dispense: 60 tablet; Refill: 0  10. Chronic pain of right knee - CMP14+EGFR - HYDROcodone-acetaminophen (NORCO/VICODIN) 5-325 MG tablet; TAKE (1) TABLET EVERY SIX HOURS AS NEEDED FOR MODERATE PAIN  Dispense: 60 tablet; Refill: 0  11. GAD (generalized anxiety disorder) Decreased xanax to daily instead of BID - CMP14+EGFR - ALPRAZolam (XANAX) 0.5 MG tablet; Take 1 tablet (0.5 mg total) by mouth at bedtime as needed for anxiety.  Dispense: 30 tablet; Refill: 5   Pt reviewed in [place] controlled database- Pt has only received controlled medication from me. Discussed we decrease xanax to daily with the intent of stopping xanax completely.   Continue all meds Labs pending Health Maintenance reviewed Diet and exercise encouraged RTO 3 months   Evelina Dun, FNP

## 2017-05-14 NOTE — Patient Instructions (Signed)

## 2017-05-15 LAB — CMP14+EGFR
A/G RATIO: 2 (ref 1.2–2.2)
ALBUMIN: 4.3 g/dL (ref 3.5–4.7)
ALT: 16 IU/L (ref 0–32)
AST: 27 IU/L (ref 0–40)
Alkaline Phosphatase: 74 IU/L (ref 39–117)
BILIRUBIN TOTAL: 0.3 mg/dL (ref 0.0–1.2)
BUN / CREAT RATIO: 14 (ref 12–28)
BUN: 12 mg/dL (ref 8–27)
CALCIUM: 10.6 mg/dL — AB (ref 8.7–10.3)
CHLORIDE: 98 mmol/L (ref 96–106)
CO2: 25 mmol/L (ref 20–29)
Creatinine, Ser: 0.85 mg/dL (ref 0.57–1.00)
GFR, EST AFRICAN AMERICAN: 74 mL/min/{1.73_m2} (ref >59)
GFR, EST NON AFRICAN AMERICAN: 64 mL/min/{1.73_m2} (ref >59)
GLOBULIN, TOTAL: 2.2 g/dL (ref 1.5–4.5)
Glucose: 128 mg/dL — ABNORMAL HIGH (ref 65–99)
POTASSIUM: 4.6 mmol/L (ref 3.5–5.2)
Sodium: 138 mmol/L (ref 134–144)
TOTAL PROTEIN: 6.5 g/dL (ref 6.0–8.5)

## 2017-05-15 LAB — LIPID PANEL
CHOL/HDL RATIO: 2.7 ratio (ref 0.0–4.4)
Cholesterol, Total: 132 mg/dL (ref 100–199)
HDL: 49 mg/dL (ref >39)
LDL Calculated: 63 mg/dL (ref 0–99)
Triglycerides: 102 mg/dL (ref 0–149)
VLDL Cholesterol Cal: 20 mg/dL (ref 5–40)

## 2017-05-15 LAB — TSH: TSH: 3.95 u[IU]/mL (ref 0.450–4.500)

## 2017-05-16 ENCOUNTER — Other Ambulatory Visit: Payer: Self-pay | Admitting: Family

## 2017-05-16 ENCOUNTER — Telehealth: Payer: Self-pay | Admitting: Family

## 2017-05-16 NOTE — Telephone Encounter (Signed)
I increased her omeprazole to BID instead of daily. This should help this. If continues may need GI referral

## 2017-05-24 ENCOUNTER — Telehealth: Payer: Self-pay | Admitting: Family

## 2017-05-24 NOTE — Telephone Encounter (Signed)
Left message, [phone]), to increase omeprazole to twice daily and call if problem continues so we can do a referral.

## 2017-05-24 NOTE — Telephone Encounter (Signed)
Left message for patient to take an omeprazole in AM and again in PM.

## 2017-05-27 ENCOUNTER — Other Ambulatory Visit (INDEPENDENT_AMBULATORY_CARE_PROVIDER_SITE_OTHER): Payer: Self-pay | Admitting: Family

## 2017-05-29 ENCOUNTER — Other Ambulatory Visit: Payer: Self-pay | Admitting: Family

## 2017-05-29 DIAGNOSIS — F411 Generalized anxiety disorder: Secondary | ICD-10-CM

## 2017-05-30 ENCOUNTER — Encounter: Payer: Self-pay | Admitting: *Deleted

## 2017-06-08 ENCOUNTER — Telehealth: Payer: Self-pay | Admitting: Family

## 2017-06-08 NOTE — Telephone Encounter (Signed)
Spoke with patient and advised to stop taking glimeperide and calcium. Patient stated understanding.

## 2017-06-12 ENCOUNTER — Other Ambulatory Visit: Payer: Self-pay | Admitting: Family

## 2017-06-14 ENCOUNTER — Other Ambulatory Visit: Payer: Self-pay | Admitting: Family

## 2017-06-18 ENCOUNTER — Other Ambulatory Visit: Payer: Self-pay | Admitting: *Deleted

## 2017-06-18 MED ORDER — METOPROLOL SUCCINATE ER 25 MG PO TB24: 25.0000 mg | ORAL_TABLET | Freq: Every day | ORAL | 0 refills | Status: DC

## 2017-06-20 DIAGNOSIS — H25813 Combined forms of age-related cataract, bilateral: Secondary | ICD-10-CM | POA: Diagnosis not present

## 2017-06-20 DIAGNOSIS — Z794 Long term (current) use of insulin: Secondary | ICD-10-CM | POA: Diagnosis not present

## 2017-06-20 DIAGNOSIS — H04123 Dry eye syndrome of bilateral lacrimal glands: Secondary | ICD-10-CM | POA: Diagnosis not present

## 2017-06-20 DIAGNOSIS — Z7984 Long term (current) use of oral hypoglycemic drugs: Secondary | ICD-10-CM | POA: Diagnosis not present

## 2017-06-20 LAB — HM DIABETES EYE EXAM

## 2017-06-27 ENCOUNTER — Encounter (HOSPITAL_COMMUNITY): Payer: Self-pay | Admitting: Emergency Medicine

## 2017-06-27 ENCOUNTER — Inpatient Hospital Stay (HOSPITAL_COMMUNITY)
Admission: EM | Admit: 2017-06-27 | Discharge: 2017-06-29 | DRG: 292 | Disposition: A | Discharge status: Home Health | Payer: Medicare HMO | Attending: Family Medicine | Admitting: Family Medicine

## 2017-06-27 ENCOUNTER — Other Ambulatory Visit: Payer: Self-pay

## 2017-06-27 DIAGNOSIS — K449 Diaphragmatic hernia without obstruction or gangrene: Secondary | ICD-10-CM | POA: Diagnosis present

## 2017-06-27 DIAGNOSIS — I2510 Atherosclerotic heart disease of native coronary artery without angina pectoris: Secondary | ICD-10-CM | POA: Diagnosis not present

## 2017-06-27 DIAGNOSIS — Z8673 Personal history of transient ischemic attack (TIA), and cerebral infarction without residual deficits: Secondary | ICD-10-CM

## 2017-06-27 DIAGNOSIS — D649 Anemia, unspecified: Secondary | ICD-10-CM | POA: Diagnosis present

## 2017-06-27 DIAGNOSIS — E039 Hypothyroidism, unspecified: Secondary | ICD-10-CM | POA: Diagnosis not present

## 2017-06-27 DIAGNOSIS — K589 Irritable bowel syndrome without diarrhea: Secondary | ICD-10-CM | POA: Diagnosis present

## 2017-06-27 DIAGNOSIS — R7989 Other specified abnormal findings of blood chemistry: Secondary | ICD-10-CM

## 2017-06-27 DIAGNOSIS — I110 Hypertensive heart disease with heart failure: Principal | ICD-10-CM | POA: Diagnosis present

## 2017-06-27 DIAGNOSIS — Z79899 Other long term (current) drug therapy: Secondary | ICD-10-CM

## 2017-06-27 DIAGNOSIS — Z950 Presence of cardiac pacemaker: Secondary | ICD-10-CM

## 2017-06-27 DIAGNOSIS — Z7982 Long term (current) use of aspirin: Secondary | ICD-10-CM

## 2017-06-27 DIAGNOSIS — E890 Postprocedural hypothyroidism: Secondary | ICD-10-CM | POA: Diagnosis present

## 2017-06-27 DIAGNOSIS — L8990 Pressure ulcer of unspecified site, unspecified stage: Secondary | ICD-10-CM | POA: Diagnosis present

## 2017-06-27 DIAGNOSIS — J449 Chronic obstructive pulmonary disease, unspecified: Secondary | ICD-10-CM | POA: Diagnosis not present

## 2017-06-27 DIAGNOSIS — R634 Abnormal weight loss: Secondary | ICD-10-CM | POA: Diagnosis present

## 2017-06-27 DIAGNOSIS — R778 Other specified abnormalities of plasma proteins: Secondary | ICD-10-CM

## 2017-06-27 DIAGNOSIS — R079 Chest pain, unspecified: Secondary | ICD-10-CM | POA: Diagnosis not present

## 2017-06-27 DIAGNOSIS — R748 Abnormal levels of other serum enzymes: Secondary | ICD-10-CM | POA: Diagnosis not present

## 2017-06-27 DIAGNOSIS — I495 Sick sinus syndrome: Secondary | ICD-10-CM | POA: Diagnosis present

## 2017-06-27 DIAGNOSIS — E1159 Type 2 diabetes mellitus with other circulatory complications: Secondary | ICD-10-CM | POA: Diagnosis present

## 2017-06-27 DIAGNOSIS — E119 Type 2 diabetes mellitus without complications: Secondary | ICD-10-CM

## 2017-06-27 DIAGNOSIS — E782 Mixed hyperlipidemia: Secondary | ICD-10-CM | POA: Diagnosis present

## 2017-06-27 DIAGNOSIS — I152 Hypertension secondary to endocrine disorders: Secondary | ICD-10-CM | POA: Diagnosis present

## 2017-06-27 DIAGNOSIS — R0682 Tachypnea, not elsewhere classified: Secondary | ICD-10-CM | POA: Diagnosis not present

## 2017-06-27 DIAGNOSIS — Z6827 Body mass index (BMI) 27.0-27.9, adult: Secondary | ICD-10-CM

## 2017-06-27 DIAGNOSIS — I5033 Acute on chronic diastolic (congestive) heart failure: Secondary | ICD-10-CM | POA: Diagnosis present

## 2017-06-27 DIAGNOSIS — Z794 Long term (current) use of insulin: Secondary | ICD-10-CM

## 2017-06-27 DIAGNOSIS — R1310 Dysphagia, unspecified: Secondary | ICD-10-CM | POA: Diagnosis present

## 2017-06-27 DIAGNOSIS — B356 Tinea cruris: Secondary | ICD-10-CM | POA: Diagnosis present

## 2017-06-27 DIAGNOSIS — R05 Cough: Secondary | ICD-10-CM | POA: Diagnosis not present

## 2017-06-27 DIAGNOSIS — Z96653 Presence of artificial knee joint, bilateral: Secondary | ICD-10-CM | POA: Diagnosis present

## 2017-06-27 DIAGNOSIS — I248 Other forms of acute ischemic heart disease: Secondary | ICD-10-CM | POA: Diagnosis present

## 2017-06-27 DIAGNOSIS — K210 Gastro-esophageal reflux disease with esophagitis: Secondary | ICD-10-CM | POA: Diagnosis not present

## 2017-06-27 DIAGNOSIS — F411 Generalized anxiety disorder: Secondary | ICD-10-CM | POA: Diagnosis present

## 2017-06-27 DIAGNOSIS — E538 Deficiency of other specified B group vitamins: Secondary | ICD-10-CM | POA: Diagnosis present

## 2017-06-27 DIAGNOSIS — Z791 Long term (current) use of non-steroidal anti-inflammatories (NSAID): Secondary | ICD-10-CM

## 2017-06-27 DIAGNOSIS — R69 Illness, unspecified: Secondary | ICD-10-CM | POA: Diagnosis not present

## 2017-06-27 DIAGNOSIS — R0602 Shortness of breath: Secondary | ICD-10-CM | POA: Diagnosis not present

## 2017-06-27 DIAGNOSIS — E1142 Type 2 diabetes mellitus with diabetic polyneuropathy: Secondary | ICD-10-CM | POA: Diagnosis present

## 2017-06-27 DIAGNOSIS — K219 Gastro-esophageal reflux disease without esophagitis: Secondary | ICD-10-CM | POA: Diagnosis present

## 2017-06-27 DIAGNOSIS — G4730 Sleep apnea, unspecified: Secondary | ICD-10-CM | POA: Diagnosis present

## 2017-06-27 DIAGNOSIS — I10 Essential (primary) hypertension: Secondary | ICD-10-CM

## 2017-06-27 DIAGNOSIS — Z880 Allergy status to penicillin: Secondary | ICD-10-CM

## 2017-06-27 NOTE — ED Triage Notes (Signed)
Pt states she gets sob on exertion. Pt lives by herself at home and called ems. Ems states pt seems confused and paranoid.

## 2017-06-28 ENCOUNTER — Other Ambulatory Visit: Payer: Self-pay

## 2017-06-28 ENCOUNTER — Observation Stay (HOSPITAL_BASED_OUTPATIENT_CLINIC_OR_DEPARTMENT_OTHER): Payer: Medicare HMO

## 2017-06-28 ENCOUNTER — Emergency Department (HOSPITAL_COMMUNITY): Payer: Medicare HMO

## 2017-06-28 ENCOUNTER — Encounter (HOSPITAL_COMMUNITY): Payer: Self-pay | Admitting: Internal Medicine

## 2017-06-28 DIAGNOSIS — B356 Tinea cruris: Secondary | ICD-10-CM | POA: Diagnosis present

## 2017-06-28 DIAGNOSIS — I248 Other forms of acute ischemic heart disease: Secondary | ICD-10-CM | POA: Diagnosis present

## 2017-06-28 DIAGNOSIS — K219 Gastro-esophageal reflux disease without esophagitis: Secondary | ICD-10-CM | POA: Diagnosis present

## 2017-06-28 DIAGNOSIS — G4730 Sleep apnea, unspecified: Secondary | ICD-10-CM | POA: Diagnosis present

## 2017-06-28 DIAGNOSIS — I152 Hypertension secondary to endocrine disorders: Secondary | ICD-10-CM | POA: Diagnosis present

## 2017-06-28 DIAGNOSIS — I110 Hypertensive heart disease with heart failure: Secondary | ICD-10-CM | POA: Diagnosis present

## 2017-06-28 DIAGNOSIS — D649 Anemia, unspecified: Secondary | ICD-10-CM | POA: Diagnosis not present

## 2017-06-28 DIAGNOSIS — E039 Hypothyroidism, unspecified: Secondary | ICD-10-CM | POA: Diagnosis not present

## 2017-06-28 DIAGNOSIS — R1310 Dysphagia, unspecified: Secondary | ICD-10-CM | POA: Diagnosis present

## 2017-06-28 DIAGNOSIS — R69 Illness, unspecified: Secondary | ICD-10-CM | POA: Diagnosis not present

## 2017-06-28 DIAGNOSIS — R748 Abnormal levels of other serum enzymes: Secondary | ICD-10-CM | POA: Diagnosis not present

## 2017-06-28 DIAGNOSIS — E890 Postprocedural hypothyroidism: Secondary | ICD-10-CM | POA: Diagnosis present

## 2017-06-28 DIAGNOSIS — F411 Generalized anxiety disorder: Secondary | ICD-10-CM | POA: Diagnosis not present

## 2017-06-28 DIAGNOSIS — R05 Cough: Secondary | ICD-10-CM | POA: Diagnosis not present

## 2017-06-28 DIAGNOSIS — I509 Heart failure, unspecified: Secondary | ICD-10-CM | POA: Insufficient documentation

## 2017-06-28 DIAGNOSIS — R0602 Shortness of breath: Secondary | ICD-10-CM | POA: Diagnosis not present

## 2017-06-28 DIAGNOSIS — R634 Abnormal weight loss: Secondary | ICD-10-CM | POA: Diagnosis present

## 2017-06-28 DIAGNOSIS — R7989 Other specified abnormal findings of blood chemistry: Secondary | ICD-10-CM

## 2017-06-28 DIAGNOSIS — I340 Nonrheumatic mitral (valve) insufficiency: Secondary | ICD-10-CM

## 2017-06-28 DIAGNOSIS — J449 Chronic obstructive pulmonary disease, unspecified: Secondary | ICD-10-CM | POA: Diagnosis not present

## 2017-06-28 DIAGNOSIS — K449 Diaphragmatic hernia without obstruction or gangrene: Secondary | ICD-10-CM | POA: Diagnosis present

## 2017-06-28 DIAGNOSIS — L8990 Pressure ulcer of unspecified site, unspecified stage: Secondary | ICD-10-CM | POA: Diagnosis present

## 2017-06-28 DIAGNOSIS — E538 Deficiency of other specified B group vitamins: Secondary | ICD-10-CM | POA: Diagnosis present

## 2017-06-28 DIAGNOSIS — I2510 Atherosclerotic heart disease of native coronary artery without angina pectoris: Secondary | ICD-10-CM | POA: Diagnosis not present

## 2017-06-28 DIAGNOSIS — Z880 Allergy status to penicillin: Secondary | ICD-10-CM | POA: Diagnosis not present

## 2017-06-28 DIAGNOSIS — I5033 Acute on chronic diastolic (congestive) heart failure: Secondary | ICD-10-CM | POA: Diagnosis not present

## 2017-06-28 DIAGNOSIS — K210 Gastro-esophageal reflux disease with esophagitis: Secondary | ICD-10-CM | POA: Diagnosis not present

## 2017-06-28 DIAGNOSIS — R079 Chest pain, unspecified: Secondary | ICD-10-CM | POA: Diagnosis not present

## 2017-06-28 DIAGNOSIS — E1142 Type 2 diabetes mellitus with diabetic polyneuropathy: Secondary | ICD-10-CM | POA: Diagnosis present

## 2017-06-28 DIAGNOSIS — E782 Mixed hyperlipidemia: Secondary | ICD-10-CM | POA: Diagnosis present

## 2017-06-28 DIAGNOSIS — E119 Type 2 diabetes mellitus without complications: Secondary | ICD-10-CM

## 2017-06-28 DIAGNOSIS — I495 Sick sinus syndrome: Secondary | ICD-10-CM | POA: Diagnosis present

## 2017-06-28 DIAGNOSIS — R778 Other specified abnormalities of plasma proteins: Secondary | ICD-10-CM | POA: Diagnosis present

## 2017-06-28 DIAGNOSIS — Z96653 Presence of artificial knee joint, bilateral: Secondary | ICD-10-CM | POA: Diagnosis present

## 2017-06-28 DIAGNOSIS — K589 Irritable bowel syndrome without diarrhea: Secondary | ICD-10-CM | POA: Diagnosis present

## 2017-06-28 LAB — HEPATIC FUNCTION PANEL
ALBUMIN: 3.2 g/dL — AB (ref 3.5–5.0)
ALK PHOS: 58 U/L (ref 38–126)
ALT: 19 U/L (ref 14–54)
AST: 32 U/L (ref 15–41)
BILIRUBIN TOTAL: 0.8 mg/dL (ref 0.3–1.2)
Bilirubin, Direct: 0.1 mg/dL (ref 0.1–0.5)
Indirect Bilirubin: 0.7 mg/dL (ref 0.3–0.9)
TOTAL PROTEIN: 6 g/dL — AB (ref 6.5–8.1)

## 2017-06-28 LAB — CBC WITH DIFFERENTIAL/PLATELET
BASOS PCT: 0 %
Basophils Absolute: 0 10*3/uL (ref 0.0–0.1)
EOS ABS: 0.1 10*3/uL (ref 0.0–0.7)
EOS PCT: 1 %
HCT: 35.4 % — ABNORMAL LOW (ref 36.0–46.0)
HEMOGLOBIN: 11.5 g/dL — AB (ref 12.0–15.0)
LYMPHS ABS: 1.6 10*3/uL (ref 0.7–4.0)
Lymphocytes Relative: 24 %
MCH: 29.1 pg (ref 26.0–34.0)
MCHC: 32.5 g/dL (ref 30.0–36.0)
MCV: 89.6 fL (ref 78.0–100.0)
MONOS PCT: 8 %
Monocytes Absolute: 0.5 10*3/uL (ref 0.1–1.0)
NEUTROS PCT: 67 %
Neutro Abs: 4.3 10*3/uL (ref 1.7–7.7)
Platelets: 194 10*3/uL (ref 150–400)
RBC: 3.95 MIL/uL (ref 3.87–5.11)
RDW: 13.1 % (ref 11.5–15.5)
WBC: 6.5 10*3/uL (ref 4.0–10.5)

## 2017-06-28 LAB — TROPONIN I
TROPONIN I: 0.04 ng/mL — AB (ref <0.03)
TROPONIN I: 0.04 ng/mL — AB (ref <0.03)
TROPONIN I: 0.04 ng/mL — AB (ref <0.03)
TROPONIN I: 0.03 ng/mL — AB (ref <0.03)
Troponin I: 0.05 ng/mL (ref <0.03)

## 2017-06-28 LAB — GLUCOSE, CAPILLARY
GLUCOSE-CAPILLARY: 118 mg/dL — AB (ref 65–99)
GLUCOSE-CAPILLARY: 150 mg/dL — AB (ref 65–99)
Glucose-Capillary: 143 mg/dL — ABNORMAL HIGH (ref 65–99)
Glucose-Capillary: 133 mg/dL — ABNORMAL HIGH (ref 65–99)
Glucose-Capillary: 126 mg/dL — ABNORMAL HIGH (ref 65–99)

## 2017-06-28 LAB — BASIC METABOLIC PANEL
Anion gap: 9 (ref 5–15)
BUN: 15 mg/dL (ref 6–20)
CALCIUM: 8.3 mg/dL — AB (ref 8.9–10.3)
CHLORIDE: 100 mmol/L — AB (ref 101–111)
CO2: 27 mmol/L (ref 22–32)
CREATININE: 0.77 mg/dL (ref 0.44–1.00)
GFR calc non Af Amer: >60 mL/min (ref >60)
GLUCOSE: 159 mg/dL — AB (ref 65–99)
Potassium: 3.6 mmol/L (ref 3.5–5.1)
Sodium: 136 mmol/L (ref 135–145)

## 2017-06-28 LAB — PHOSPHORUS: Phosphorus: 3.1 mg/dL (ref 2.5–4.6)

## 2017-06-28 LAB — ECHOCARDIOGRAM COMPLETE
Height: 70 in
WEIGHTICAEL: 3054.69 [oz_av]

## 2017-06-28 LAB — MRSA PCR SCREENING: MRSA by PCR: POSITIVE — AB

## 2017-06-28 LAB — BRAIN NATRIURETIC PEPTIDE: B NATRIURETIC PEPTIDE 5: 1485 pg/mL — AB (ref 0.0–100.0)

## 2017-06-28 LAB — MAGNESIUM: Magnesium: 0.9 mg/dL — CL (ref 1.7–2.4)

## 2017-06-28 MED ORDER — INSULIN NPH (HUMAN) (ISOPHANE) 100 UNIT/ML ~~LOC~~ SUSP
8.0000 [IU] | Freq: Two times a day (BID) | SUBCUTANEOUS | Status: DC
  Administered 2017-06-28 – 2017-06-29 (×2): 8 [IU] via SUBCUTANEOUS
  Filled 2017-06-28: qty 10

## 2017-06-28 MED ORDER — HYDROCODONE-ACETAMINOPHEN 5-325 MG PO TABS: 1.0000 | ORAL_TABLET | Freq: Four times a day (QID) | ORAL | Status: DC | PRN

## 2017-06-28 MED ORDER — FUROSEMIDE 10 MG/ML IJ SOLN
40.0000 mg | Freq: Two times a day (BID) | INTRAMUSCULAR | Status: DC
  Administered 2017-06-28 – 2017-06-29 (×3): 40 mg via INTRAVENOUS
  Filled 2017-06-28 (×3): qty 4

## 2017-06-28 MED ORDER — CLOTRIMAZOLE 1 % EX CREA
TOPICAL_CREAM | Freq: Two times a day (BID) | CUTANEOUS | Status: DC
  Administered 2017-06-28 – 2017-06-29 (×3): via TOPICAL
  Filled 2017-06-28: qty 15

## 2017-06-28 MED ORDER — PANTOPRAZOLE SODIUM 40 MG PO TBEC
40.0000 mg | DELAYED_RELEASE_TABLET | Freq: Every day | ORAL | Status: DC
  Administered 2017-06-28 – 2017-06-29 (×2): 40 mg via ORAL
  Filled 2017-06-28 (×2): qty 1

## 2017-06-28 MED ORDER — ALPRAZOLAM 0.5 MG PO TABS
0.5000 mg | ORAL_TABLET | Freq: Every evening | ORAL | Status: DC | PRN
  Administered 2017-06-28: 0.5 mg via ORAL
  Filled 2017-06-28: qty 1

## 2017-06-28 MED ORDER — METFORMIN HCL 500 MG PO TABS
1000.0000 mg | ORAL_TABLET | Freq: Two times a day (BID) | ORAL | Status: DC
Start: 2017-06-28 — End: 2017-06-28
  Filled 2017-06-28: qty 2

## 2017-06-28 MED ORDER — SERTRALINE HCL 50 MG PO TABS
50.0000 mg | ORAL_TABLET | Freq: Every day | ORAL | Status: DC
  Administered 2017-06-28 – 2017-06-29 (×2): 50 mg via ORAL
  Filled 2017-06-28 (×2): qty 1

## 2017-06-28 MED ORDER — CYCLOBENZAPRINE HCL 10 MG PO TABS: 10.0000 mg | ORAL_TABLET | Freq: Three times a day (TID) | ORAL | Status: DC | PRN

## 2017-06-28 MED ORDER — METOPROLOL SUCCINATE ER 25 MG PO TB24
25.0000 mg | ORAL_TABLET | Freq: Every day | ORAL | Status: DC
  Administered 2017-06-28 – 2017-06-29 (×2): 25 mg via ORAL
  Filled 2017-06-28 (×2): qty 1

## 2017-06-28 MED ORDER — POTASSIUM CHLORIDE CRYS ER 20 MEQ PO TBCR
40.0000 meq | EXTENDED_RELEASE_TABLET | Freq: Once | ORAL | Status: AC
  Administered 2017-06-28: 40 meq via ORAL
  Filled 2017-06-28: qty 2

## 2017-06-28 MED ORDER — GABAPENTIN 300 MG PO CAPS
300.0000 mg | ORAL_CAPSULE | Freq: Three times a day (TID) | ORAL | Status: DC
  Administered 2017-06-28 – 2017-06-29 (×4): 300 mg via ORAL
  Filled 2017-06-28 (×4): qty 1

## 2017-06-28 MED ORDER — LEVOTHYROXINE SODIUM 25 MCG PO TABS: 25.0000 ug | ORAL_TABLET | Freq: Every day | ORAL | Status: DC

## 2017-06-28 MED ORDER — LISINOPRIL 10 MG PO TABS
10.0000 mg | ORAL_TABLET | Freq: Every day | ORAL | Status: DC
  Administered 2017-06-28 – 2017-06-29 (×2): 10 mg via ORAL
  Filled 2017-06-28 (×2): qty 1

## 2017-06-28 MED ORDER — PRAVASTATIN SODIUM 40 MG PO TABS
80.0000 mg | ORAL_TABLET | Freq: Every day | ORAL | Status: DC
  Administered 2017-06-28 – 2017-06-29 (×2): 80 mg via ORAL
  Filled 2017-06-28 (×2): qty 2

## 2017-06-28 MED ORDER — GLIMEPIRIDE 2 MG PO TABS
4.0000 mg | ORAL_TABLET | Freq: Every day | ORAL | Status: DC
Start: 2017-06-28 — End: 2017-06-29
  Administered 2017-06-29: 4 mg via ORAL
  Filled 2017-06-28 (×2): qty 2

## 2017-06-28 MED ORDER — LEVOTHYROXINE SODIUM 75 MCG PO TABS
225.0000 ug | ORAL_TABLET | Freq: Every day | ORAL | Status: DC
  Administered 2017-06-28 – 2017-06-29 (×2): 225 ug via ORAL
  Filled 2017-06-28 (×2): qty 3

## 2017-06-28 MED ORDER — ENOXAPARIN SODIUM 40 MG/0.4ML ~~LOC~~ SOLN
40.0000 mg | SUBCUTANEOUS | Status: DC
  Administered 2017-06-28 – 2017-06-29 (×2): 40 mg via SUBCUTANEOUS
  Filled 2017-06-28 (×2): qty 0.4

## 2017-06-28 MED ORDER — MOMETASONE FURO-FORMOTEROL FUM 100-5 MCG/ACT IN AERO
2.0000 | INHALATION_SPRAY | Freq: Two times a day (BID) | RESPIRATORY_TRACT | Status: DC
  Administered 2017-06-28 – 2017-06-29 (×3): 2 via RESPIRATORY_TRACT
  Filled 2017-06-28: qty 8.8

## 2017-06-28 MED ORDER — ONDANSETRON HCL 4 MG/2ML IJ SOLN
4.0000 mg | Freq: Once | INTRAMUSCULAR | Status: AC
  Administered 2017-06-28: 4 mg via INTRAVENOUS
  Filled 2017-06-28: qty 2

## 2017-06-28 MED ORDER — INSULIN ASPART 100 UNIT/ML ~~LOC~~ SOLN
0.0000 [IU] | Freq: Three times a day (TID) | SUBCUTANEOUS | Status: DC
  Administered 2017-06-29: 2 [IU] via SUBCUTANEOUS

## 2017-06-28 MED ORDER — MAGNESIUM SULFATE 2 GM/50ML IV SOLN
2.0000 g | INTRAVENOUS | Status: AC
  Administered 2017-06-28 (×2): 2 g via INTRAVENOUS
  Filled 2017-06-28 (×3): qty 50

## 2017-06-28 MED ORDER — NITROGLYCERIN 0.4 MG/SPRAY TL SOLN
1.0000 | Status: DC | PRN
  Filled 2017-06-28: qty 4.9

## 2017-06-28 MED ORDER — FUROSEMIDE 10 MG/ML IJ SOLN
40.0000 mg | Freq: Once | INTRAMUSCULAR | Status: AC
  Administered 2017-06-28: 40 mg via INTRAVENOUS
  Filled 2017-06-28: qty 4

## 2017-06-28 MED ORDER — HYDRALAZINE HCL 20 MG/ML IJ SOLN
10.0000 mg | Freq: Once | INTRAMUSCULAR | Status: AC
  Administered 2017-06-28: 10 mg via INTRAVENOUS
  Filled 2017-06-28: qty 1

## 2017-06-28 MED ORDER — ASPIRIN EC 81 MG PO TBEC
81.0000 mg | DELAYED_RELEASE_TABLET | Freq: Every day | ORAL | Status: DC
  Administered 2017-06-28 – 2017-06-29 (×2): 81 mg via ORAL
  Filled 2017-06-28 (×2): qty 1

## 2017-06-28 MED ORDER — NITROGLYCERIN 0.4 MG SL SUBL
0.4000 mg | SUBLINGUAL_TABLET | SUBLINGUAL | Status: AC | PRN
  Administered 2017-06-28 (×3): 0.4 mg via SUBLINGUAL
  Filled 2017-06-28: qty 1

## 2017-06-28 MED ORDER — LEVOTHYROXINE SODIUM 100 MCG PO TABS: 200.0000 ug | ORAL_TABLET | Freq: Every day | ORAL | Status: DC

## 2017-06-28 MED ORDER — ASPIRIN 81 MG PO CHEW
324.0000 mg | CHEWABLE_TABLET | Freq: Once | ORAL | Status: AC
  Administered 2017-06-28: 324 mg via ORAL
  Filled 2017-06-28: qty 4

## 2017-06-28 MED ORDER — MELOXICAM 7.5 MG PO TABS
7.5000 mg | ORAL_TABLET | Freq: Every day | ORAL | Status: DC
  Administered 2017-06-28 – 2017-06-29 (×2): 7.5 mg via ORAL
  Filled 2017-06-28 (×5): qty 1

## 2017-06-28 MED ORDER — ALBUTEROL SULFATE (2.5 MG/3ML) 0.083% IN NEBU: 2.5000 mg | INHALATION_SOLUTION | RESPIRATORY_TRACT | Status: DC | PRN

## 2017-06-28 MED ORDER — CHLORHEXIDINE GLUCONATE CLOTH 2 % EX PADS
6.0000 | MEDICATED_PAD | Freq: Every day | CUTANEOUS | Status: DC
  Administered 2017-06-29: 6 via TOPICAL

## 2017-06-28 MED ORDER — VITAMIN B-12 1000 MCG PO TABS
1000.0000 ug | ORAL_TABLET | Freq: Every day | ORAL | Status: DC
  Administered 2017-06-28 – 2017-06-29 (×2): 1000 ug via ORAL
  Filled 2017-06-28 (×2): qty 1

## 2017-06-28 MED ORDER — ONDANSETRON 4 MG PO TBDP
4.0000 mg | ORAL_TABLET | Freq: Three times a day (TID) | ORAL | Status: DC | PRN
  Filled 2017-06-28: qty 1

## 2017-06-28 MED ORDER — MUPIROCIN 2 % EX OINT
1.0000 "application " | TOPICAL_OINTMENT | Freq: Two times a day (BID) | CUTANEOUS | Status: DC
  Administered 2017-06-28 – 2017-06-29 (×3): 1 via NASAL
  Filled 2017-06-28: qty 22

## 2017-06-28 NOTE — Progress Notes (Signed)
*  PRELIMINARY RESULTS* Echocardiogram 2D Echocardiogram has been performed.  Jeryl Columbialliott, Rozell Kettlewell 06/28/2017, 4:16 PM

## 2017-06-28 NOTE — Progress Notes (Signed)
PROGRESS NOTE                                                                                                                                                                                                             Patient Demographics:    [name] [name], is a 84 y.o. female, DOB - Jan 25, 1933, WGN:[id number]  Admit date - 06/27/2017   Admitting Physician Bobette Moavid Manuel Ortiz, MD  Outpatient Primary MD for the patient is Junie SpencerHawks, Christy A, FNP  LOS - 0  Outpatient Specialists: None  Chief Complaint  Patient presents with  . Shortness of Breath       Brief Narrative   84 year old female with anxiety, history of CVA, COPD, type 2 diabetes mellitus, GERD, hypertension, hyperlipidemia, hypothyroidism neuropathy, sleep apnea, B12 deficiency, coronary artery disease with sinoatrial node dysfunction status post pacemaker, chronic diastolic CHF presented to the ED with shortness of breath for past 2 days worsened with exertion and also complains of some choking on food and productive cough . Patient denies lower extremity edema and PND but reported orthopnea. She reports losing almost 70 pounds in the past 6 months. (Unintentional). Patient found to have elevated BNP up to 1500 and mildly elevated troponin (0.04). Had severely low magnesium 0.9 and chest x-ray showing moderate right and trace left pleural effusions, right basilar opacity (possibly atelectasis)    Subjective:    reports feeling weak. Feels her shortness of breath is better after receiving Lasix   Assessment  & Plan :    Principal Problem:   Acute on chronic diastolic congestive heart failure (HCC) And tinea IV Lasix 40 mg twice a day. Strict I/O and daily weight. Check 2-D echo.  Active Problems: Dysphagia and weight loss Reports choking on food and almost 60 lbs unintentional  wt loss in 5-6 months. SLP eval.   Elevated troponin Likely demand ischemia with  CHF. No chest pain symptoms. Check echo. Monitor on telemetry.  Anemia Check stool for hemoccult. hb from 2 years back was normal at 14. Recent iron panel unremarkable. Low B12. Added cyanocobalamin. GI eval outpt.  Significant weight loss Reports 70lb weight loss in 6 months. Noted some drop in H&H from baseline. Check stool for Hemoccult. Needs outpatient GI evaluation.  Dysphagia and choking on food. Swallowing evaluation ordered.  Tinea cruris Added topical antifungal.  Hypothyroid continue synthroid. recent TSH normal   type 2 diabetes mellitus, controlled (HCC) Continue NPH twice a day, Amaryl and hold metformin. Monitor on sliding scale insulin.    COPD Stable. Bronchodilators as needed. Not on home O2.  Denies anxiety disorder Continue Xanax and Zoloft.  Missed hyperlipidemia   continue statin.  Hypomagnesemia Replenish. Added potassium supplement.      Code Status : full code  family communication: None at bedside   Disposition Plan  home once clinically improved, possibly in the next 1-2 days.  Barriers For Discharge :  active symptoms   Consults: None  Procedures  :  2d echo  DVT Prophylaxis  :  Lovenox -   Lab Results  Component Value Date   PLT 194 06/28/2017    Antibiotics  :    Anti-infectives (From admission, onward)   None        Objective:   Vitals:   06/28/17 0300 06/28/17 0440 06/28/17 0500 06/28/17 0600  BP: (!) 180/72 (!) 193/69 (!) 155/48 107/86  Pulse: 60 62 60 60  Resp: (!) 21 20 (!) 21 15  Temp:  98 F (36.7 C)    TempSrc:  Oral    SpO2: 97% 96% 92% 96%  Weight:  86.6 kg (190 lb 14.7 oz)    Height:  5\' 10"  (1.778 m)      Wt Readings from Last 3 Encounters:  06/28/17 86.6 kg (190 lb 14.7 oz)  05/14/17 90.3 kg (199 lb)  03/29/17 93.3 kg (205 lb 9.6 oz)    No intake or output data in the 24 hours ending 06/28/17 0834   Physical Exam  Gen: not in distress, appears fatigued  HEENT:no pallor, no JVD, t  mucosa, supple neck Chest: Diminished breath sounds over lung bases. CVS: N S1&S2, no murmurs, rubs or gallop GI: soft, NT, ND, BS+ Musculoskeletal: warm, no edema     Data Review:    CBC Recent Labs  Lab 06/28/17 0111  WBC 6.5  HGB 11.5*  HCT 35.4*  PLT 194  MCV 89.6  MCH 29.1  MCHC 32.5  RDW 13.1  LYMPHSABS 1.6  MONOABS 0.5  EOSABS 0.1  BASOSABS 0.0    Chemistries  Recent Labs  Lab 06/28/17 0111 06/28/17 0349  NA 136  --   K 3.6  --   CL 100*  --   CO2 27  --   GLUCOSE 159*  --   BUN 15  --   CREATININE 0.77  --   CALCIUM 8.3*  --   MG  --  0.9*  AST  --  32  ALT  --  19  ALKPHOS  --  58  BILITOT  --  0.8   ------------------------------------------------------------------------------------------------------------------ No results for input(s): CHOL, HDL, LDLCALC, TRIG, CHOLHDL, LDLDIRECT in the last 72 hours.  Lab Results  Component Value Date   HGBA1C 6.4 08/02/2015   ------------------------------------------------------------------------------------------------------------------ No results for input(s): TSH, T4TOTAL, T3FREE, THYROIDAB in the last 72 hours.  Invalid input(s): FREET3 ------------------------------------------------------------------------------------------------------------------ No results for input(s): VITAMINB12, FOLATE, FERRITIN, TIBC, IRON, RETICCTPCT in the last 72 hours.  Coagulation profile No results for input(s): INR, PROTIME in the last 168 hours.  No results for input(s): DDIMER in the last 72 hours.  Cardiac Enzymes Recent Labs  Lab 06/28/17 0111 06/28/17 0349  TROPONINI 0.04* 0.05*   ------------------------------------------------------------------------------------------------------------------    Component Value Date/Time   BNP 1,485.0 (H) 06/28/2017 0111    Inpatient Medications  Scheduled Meds: . aspirin EC  81 mg Oral Daily  . clotrimazole   Topical BID  . enoxaparin (LOVENOX) injection  40  mg Subcutaneous Q24H  . furosemide  40 mg Intravenous BID  . gabapentin  300 mg Oral TID  . glimepiride  4 mg Oral Q breakfast  . insulin aspart  0-9 Units Subcutaneous TID WC  . insulin NPH Human  8 Units Subcutaneous BID AC  . levothyroxine  225 mcg Oral QAC breakfast  . lisinopril  10 mg Oral Daily  . meloxicam  7.5 mg Oral Daily  . metFORMIN  1,000 mg Oral BID WC  . metoprolol succinate  25 mg Oral Q breakfast  . mometasone-formoterol  2 puff Inhalation BID  . pantoprazole  40 mg Oral Daily  . potassium chloride  40 mEq Oral Once  . pravastatin  80 mg Oral Daily  . sertraline  50 mg Oral Daily  . vitamin B-12  1,000 mcg Oral Daily   Continuous Infusions: . magnesium sulfate 1 - 4 g bolus IVPB Stopped (06/28/17 0925)   PRN Meds:.albuterol, ALPRAZolam, cyclobenzaprine, HYDROcodone-acetaminophen, nitroGLYCERIN, ondansetron  Micro Results No results found for this or any previous visit (from the past 240 hour(s)).  Radiology Reports Dg Chest 2 View  Result Date: 06/28/2017 CLINICAL DATA:  84 y/o F; shortness of breath, cough, and mid lower chest pain for 2 days. EXAM: CHEST  2 VIEW COMPARISON:  03/14/2016 chest radiograph FINDINGS: Stable cardiac silhouette given projection and technique. Aortic atherosclerosis with calcification. Two lead pacemaker. Moderate right and trace left pleural effusions. Reticular opacities of the lungs. No pneumothorax. No acute osseous abnormality is evident. IMPRESSION: Moderate right and trace left pleural effusions, right basilar opacity may represent associated atelectasis or pneumonia. Reticular opacities of lungs, probably interstitial edema. Electronically Signed   By: Mitzi Hansen M.D.   On: 06/28/2017 00:45    Time Spent in minutes  20   Meesha Sek M.D on 06/28/2017 at 8:34 AM  Between 7am to 7pm - Pager - [phone]  After 7pm go to [email or web address] - password [facility]  [facility] -  Office  [phone]

## 2017-06-28 NOTE — Progress Notes (Signed)
CRITICAL VALUE ALERT  Critical Value:  Troponin .05, mag 0.9   Date & Time Notied:  06-28-17  0500  Provider Notified: MD Robb Matarrtiz    Orders Received/Actions taken: 4G Mag IVP, hep gtt consult per pharm team

## 2017-06-28 NOTE — Evaluation (Signed)
Physical Therapy Evaluation Patient Details Name: [name] [name] M Lalone MRN: [id number] DOB: 11-15-1932 Today's Date: 06/28/2017   History of Present Illness  [name] [name] is a 84 y.o. female with medical history significant of anxiety, carpal tunnel syndrome, history of CVA, history of left fibular fracture, COPD, type 2 diabetes, GERD, hyperlipidemia, hypertension, hypothyroidism, needle gastritis, peripheral neuropathy, sleep apnea, vitamin B12 deficiency, CAD, sinoatrial node dysfunction, pacemaker placement, chronic diastolic CHF who is coming to the emergency department with complaints of shortness of breath for the past 2 days, particularly while exerting associated with whitish/yellowish productive cough.  She denies lower extremity edema, PND, but reports orthopnea and states that she has been sleeping on her recliner.  No chest pain, palpitations, dizziness or diaphoresis.  She denies fever, chills, sore throat, wheezing or hemoptysis.      Clinical Impression  Patient limited for functional mobility as stated below secondary to BLE weakness, fatigue and fair standing balance.  Patient will benefit from continued physical therapy in hospital and recommended venue below to increase strength, balance, endurance for safe ADLs and gait.     Follow Up Recommendations Home health PT;Supervision - Intermittent    Equipment Recommendations  None recommended by PT    Recommendations for Other Services       Precautions / Restrictions Precautions Precautions: Fall Restrictions Weight Bearing Restrictions: No      Mobility  Bed Mobility Overal bed mobility: Modified Independent                Transfers Overall transfer level: Needs assistance Equipment used: None Transfers: Sit to/from Stand;Stand Pivot Transfers Sit to Stand: Min guard Stand pivot transfers: Min guard       General transfer comment: slow labored movement  Ambulation/Gait Ambulation/Gait assistance: Min  guard Ambulation Distance (Feet): 30 Feet Assistive device: None Gait Pattern/deviations: Decreased step length - right;Decreased step length - left;Decreased stride length   Gait velocity interpretation: Below normal speed for age/gender General Gait Details: pateint able to ambulate without assistive device demonstrating slightly labored slow cadence with frequent leaning on siderail, nearby objects for support, no loss of balance and O2 saturation 88-94% on room air  Stairs            Wheelchair Mobility    Modified Rankin (Stroke Patients Only)       Balance Overall balance assessment: Needs assistance Sitting-balance support: No upper extremity supported;Feet supported Sitting balance-Leahy Scale: Good     Standing balance support: No upper extremity supported;During functional activity Standing balance-Leahy Scale: Fair                               Pertinent Vitals/Pain Pain Assessment: No/denies pain    Home Living Family/patient expects to be discharged to:: Private residence Living Arrangements: Spouse/significant other Available Help at Discharge: Neighbor;Friend(s) Type of Home: House Home Access: Ramped entrance     Home Layout: One level Home Equipment: Cane - single point;Walker - 2 wheels;Bedside commode;Shower seat;Wheelchair - manual      Prior Function Level of Independence: Independent         Comments: usually ambulates household distances without an assistive device     Hand Dominance        Extremity/Trunk Assessment   Upper Extremity Assessment Upper Extremity Assessment: Generalized weakness    Lower Extremity Assessment Lower Extremity Assessment: Generalized weakness    Cervical / Trunk Assessment Cervical / Trunk Assessment: Normal  Communication  Communication: No difficulties  Cognition Arousal/Alertness: Awake/alert Behavior During Therapy: WFL for tasks assessed/performed Overall Cognitive  Status: Within Functional Limits for tasks assessed                                        General Comments      Exercises     Assessment/Plan    PT Assessment Patient needs continued PT services  PT Problem List Decreased strength;Decreased activity tolerance;Decreased balance;Decreased mobility       PT Treatment Interventions Gait training;Functional mobility training;Therapeutic activities;Therapeutic exercise;Patient/family education    PT Goals (Current goals can be found in the Care Plan section)  Acute Rehab PT Goals Patient Stated Goal: return home with neighbors and friend to assist PT Goal Formulation: With patient Time For Goal Achievement: 07/01/17 Potential to Achieve Goals: Good    Frequency Min 3X/week   Barriers to discharge        Co-evaluation               AM-PAC PT "6 Clicks" Daily Activity  Outcome Measure Difficulty turning over in bed (including adjusting bedclothes, sheets and blankets)?: None Difficulty moving from lying on back to sitting on the side of the bed? : None Difficulty sitting down on and standing up from a chair with arms (e.g., wheelchair, bedside commode, etc,.)?: A Little Help needed moving to and from a bed to chair (including a wheelchair)?: A Little Help needed walking in hospital room?: A Little Help needed climbing 3-5 steps with a railing? : A Little 6 Click Score: 20    End of Session   Activity Tolerance: Patient tolerated treatment well;Patient limited by fatigue Patient left: in chair;with call bell/phone within reach Nurse Communication: Mobility status PT Visit Diagnosis: Unsteadiness on feet (R26.81);Other abnormalities of gait and mobility (R26.89);Muscle weakness (generalized) (M62.81)    Time: 0981-1914 PT Time Calculation (min) (ACUTE ONLY): 31 min   Charges:   PT Evaluation $PT Eval Moderate Complexity: 1 Mod PT Treatments $Therapeutic Activity: 23-37 mins   PT G Codes:         3:00 PM, 07/23/2017 Ocie Bob, MPT Physical Therapist with [facility] [phone] office [phone] mobile phone

## 2017-06-28 NOTE — Plan of Care (Signed)
  Acute Rehab PT Goals(only PT should resolve) Patient Will Perform Sitting Balance 06/28/2017 1500 - Progressing by Ocie BobWatkins, Nedda Gains, PT Flowsheets Taken 06/28/2017 1500  Patient will perform sitting balance with modified independence Patient Will Transfer Sit To/From Stand 06/28/2017 1500 - Progressing by Ocie BobWatkins, Kennedey Digilio, PT Flowsheets Taken 06/28/2017 1500  Patient will transfer sit to/from stand Independently Pt Will Transfer Bed To Chair/Chair To Bed 06/28/2017 1500 - Progressing by Ocie BobWatkins, Shyan Scalisi, PT Flowsheets Taken 06/28/2017 1500  Pt will Transfer Bed to Chair/Chair to Bed with modified independence Pt Will Ambulate 06/28/2017 1500 - Progressing by Ocie BobWatkins, Donaven Criswell, PT Flowsheets Taken 06/28/2017 1500  Pt will Ambulate 75 feet;with modified independence;with rolling walker;with cane  3:02 PM, 06/28/17 Ocie BobJames Nikea Settle, MPT Physical Therapist [facility] [phone] office [phone] mobile phone

## 2017-06-28 NOTE — ED Provider Notes (Signed)
[facility] EMERGENCY DEPARTMENT Provider Note   CSN: [id number] Arrival date & time: 06/27/17  2348     History   Chief Complaint Chief Complaint  Patient presents with  . Shortness of Breath    HPI [name] [name] is a 84 y.o. female.  The history is provided by the patient.  She has history of hypertension, diabetes, hyperlipidemia, COPD, coronary artery disease, peripheral vascular disease, diastolic heart failure and comes in with sounds like she is choking.  There is associated dyspnea, nausea, vomiting but no diaphoresis.  This came on tonight, but she states she has had less severe episodes the last 2 nights.  She denies any cough.  She has not done any treatment for any of this.  She also endorses 75 pound weight loss over the last 8months.  Past Medical History:  Diagnosis Date  . ANXIETY   . Arteriosclerotic cardiovascular disease (ASCVD)    Cath in 9/99-70% mid LAD with diffuse distal disease, 70% T1, 60% mid circumflex, 50% mid RCA, normal ejection fraction; negative stress nuclear in 07/2008  . Carpal tunnel syndrome   . Cerebrovascular disease    carotid bruits; no focal disease in 1999, 2002 and 2006  . Closed left fibular fracture 11/30/11  . COPD   . Diabetes mellitus    Insulin treatment  . Diastolic CHF, chronic (HCC)    Normal EF  . Gastroesophageal reflux disease    With hiatal hernia; irritable bowel syndrome; H. pylori-treated; Colonoscopy 2008: non-specific colitis, IH, Diverticulosis, Rectal ulcer secondary to ASA  . Hyperlipidemia   . Hypertension   . Hypothyroidism   . OSTEOARTHRITIS, KNEES, BILATERAL    Bilateral TKA  . Pacemaker   . Peripheral neuropathy   . Sinoatrial node dysfunction (HCC) 2003   Medtronic dual-chamber device  . Sleep apnea   . VITAMIN B12 DEFICIENCY     Patient Active Problem List   Diagnosis Date Noted  . Hyperlipidemia associated with type 2 diabetes mellitus (HCC) 05/14/2017  . Chronic back pain 01/28/2017  . PVD  (peripheral vascular disease) (HCC) 01/27/2016  . Skin moniliasis 05/31/2014  . OAB (overactive bladder) 12/23/2012  . Insomnia 04/06/2011  . Sinoatrial node dysfunction (HCC)   . Gastroesophageal reflux disease   . Type II diabetes mellitus with peripheral autonomic neuropathy (HCC)   . GAD (generalized anxiety disorder) 12/30/2009  . Diabetic neuropathy (HCC) 12/26/2009  . Vitamin B 12 deficiency 10/11/2009  . Morbid obesity (HCC) 09/16/2009  . Arteriosclerotic cardiovascular disease (ASCVD) 09/15/2009  . CEREBROVASCULAR DISEASE 09/15/2009  . Hypertension associated with diabetes (HCC) 08/29/2009  . Osteoarthrosis involving lower leg 08/29/2009  . COLONIC POLYPS, ADENOMATOUS, HX OF 02/22/2009  . Hypothyroid 02/17/2009  . Chronic obstructive pulmonary disease (HCC) 02/17/2009  . Chronic diastolic heart failure (HCC) 12/15/2008  . PPM-Medtronic 12/15/2008    Past Surgical History:  Procedure Laterality Date  . ABDOMINAL HYSTERECTOMY    . BLADDER REPAIR    . CARDIAC PACEMAKER PLACEMENT  2003   Medtronic, dual-chamber  . CARPAL TUNNEL RELEASE    . CHOLECYSTECTOMY    . COLONOSCOPY  2008  . HEMORRHOID SURGERY    . NASAL FRACTURE SURGERY    . THYROIDECTOMY  1980   Goiter  . TOTAL KNEE ARTHROPLASTY     Bilateral    OB History    No data available       Home Medications    Prior to Admission medications   Medication Sig Start Date End Date Taking? Authorizing  Provider  ALPRAZolam Prudy Feeler) 0.5 MG tablet Take 1 tablet (0.5 mg total) by mouth at bedtime as needed for anxiety. 05/14/17   Jannifer Rodney A, FNP  aspirin EC 81 MG tablet Take 81 mg by mouth daily. Reported on 12/14/2015    [provider]  ASSURE COMFORT LANCETS 30G MISC  07/16/14   [provider]  budesonide-formoterol (SYMBICORT) 80-4.5 MCG/ACT inhaler Inhale 2 puffs into the lungs 2 (two) times daily. 03/06/16   Junie Spencer, FNP  cyclobenzaprine (FLEXERIL) 10 MG tablet Take 1 tablet by mouth  3 (three) times daily as needed f or muscle spasms. 02/07/17   Junie Spencer, FNP  furosemide (LASIX) 40 MG tablet Take 0.5-1 tablets (20-40 mg total) by mouth daily as needed. Reported on 12/14/2015 03/28/16   Henrene Pastor, PharmD  gabapentin (NEURONTIN) 300 MG capsule TAKE (1) CAPSULE THREE TIMES DAILY. 06/13/17   Jannifer Rodney A, FNP  glimepiride (AMARYL) 4 MG tablet TAKE (1) TABLET DAILY BE- FORE BREAKFAST. 05/29/17   Jannifer Rodney A, FNP  glucose blood test strip Use TID and prn 05/23/15   Jannifer Rodney A, FNP  HYDROcodone-acetaminophen (NORCO/VICODIN) 5-325 MG tablet TAKE (1) TABLET EVERY SIX HOURS AS NEEDED FOR MODERATE PAIN. 01/28/17   Jannifer Rodney A, FNP  HYDROcodone-acetaminophen (NORCO/VICODIN) 5-325 MG tablet TAKE (1) TABLET EVERY SIX HOURS AS NEEDED FOR MODERATE PAIN 05/14/17   Jannifer Rodney A, FNP  HYDROcodone-acetaminophen (NORCO/VICODIN) 5-325 MG tablet Take 1 tablet by mouth every 6 (six) hours as needed for moderate pain. 05/14/17   Jannifer Rodney A, FNP  insulin NPH Human (HUMULIN N) 100 UNIT/ML injection Inject 0.08-0.1 mLs (8-10 Units total) into the skin 2 (two) times daily before a meal. 01/14/17   Henrene Pastor, PharmD  levothyroxine (SYNTHROID, LEVOTHROID) 200 MCG tablet TAKE (1) TABLET DAILY BE- FORE BREAKFAST. 03/29/17   Junie Spencer, FNP  levothyroxine (SYNTHROID, LEVOTHROID) 25 MCG tablet Take 1 tablet (25 mcg total) by mouth daily before breakfast. 01/29/17   Jannifer Rodney A, FNP  lisinopril (PRINIVIL,ZESTRIL) 10 MG tablet TAKE 1 TABLET DAILY 11/26/16   Jannifer Rodney A, FNP  lisinopril (PRINIVIL,ZESTRIL) 10 MG tablet TAKE 1 TABLET DAILY 05/29/17   Jannifer Rodney A, FNP  meloxicam (MOBIC) 15 MG tablet TAKE 1 TABLET DAILY 06/13/17   Jannifer Rodney A, FNP  metFORMIN (GLUCOPHAGE) 1000 MG tablet TAKE (1) TABLET TWICE A DAY WITH MEALS (BREAKFAST AND SUPPER) FOR DIAB ETES 06/17/17   Jannifer Rodney A, FNP  metoprolol succinate (TOPROL-XL) 25 MG 24 hr tablet Take 1 tablet (25 mg  total) by mouth daily. Take with or immediately following a meal. 06/18/17   Hawks, Neysa Bonito A, FNP  Multiple Vitamins-Minerals (CENTRUM WOMEN PO) Take 1 tablet by mouth daily.    [provider]  nitroGLYCERIN (NITROLINGUAL) 0.4 MG/SPRAY spray Place 1 spray under the tongue every 5 (five) minutes as needed. 10/28/15   Mechele Claude, MD  omeprazole (PRILOSEC) 20 MG capsule Take 1 capsule (20 mg total) by mouth 2 (two) times daily before a meal. 05/14/17   Hawks, Neysa Bonito A, FNP  ondansetron (ZOFRAN ODT) 4 MG disintegrating tablet Take 1 tablet (4 mg total) by mouth every 8 (eight) hours as needed for nausea or vomiting. 03/01/17   Junie Spencer, FNP  [facility] VERIO test strip Use as instructed 05/30/16   Jannifer Rodney A, FNP  pravastatin (PRAVACHOL) 80 MG tablet TAKE 1 TABLET ONCE DAILY FOR CHOLESTEROL 05/29/17   Junie Spencer, FNP  PROAIR HFA 108 (  90 Base) MCG/ACT inhaler 2 PUFFS EVERY 6 HOURS AS NEEDED FOR WHEEZING 01/12/16   Jannifer Rodney A, FNP  sertraline (ZOLOFT) 50 MG tablet TAKE 1 TABLET DAILY 05/29/17   Junie Spencer, FNP  SURE COMFORT INSULIN SYRINGE 31G X 5/16" 0.3 ML MISC  07/16/14   [provider]    Family History Family History  Problem Relation Age of Onset  . COPD Father   . Heart attack Father 38  . Heart failure Mother   . Cancer Sister        colon  . Cancer Brother        lung  . Cancer Sister        bladder  . Cancer Brother        lung  . Early death Son   . Diabetes Son   . Heart disease Son   . Cancer Son        prostate  . Cancer Sister        kidney cancer  . Diabetes Sister   . Cancer Daughter     Social History Social History   Tobacco Use  . Smoking status: Never Smoker  . Smokeless tobacco: Never Used  Substance Use Topics  . Alcohol use: No    Alcohol/week: 0.0 oz  . Drug use: No     Allergies   Penicillins   Review of Systems Review of Systems  All other systems reviewed and are negative.    Physical  Exam Updated Vital Signs BP (!) 191/75 (BP Location: Right Arm)   Pulse 68   Temp 97.8 F (36.6 C)   Resp 18   Ht 5\' 10"  (1.778 m)   Wt 85.7 kg (189 lb)   SpO2 92%   BMI 27.12 kg/m   Physical Exam  Nursing note and vitals reviewed.  84 year old female, resting comfortably and in no acute distress. Vital signs are significant for hypertension. Oxygen saturation is 92%, which is normal. Head is normocephalic and atraumatic. PERRLA, EOMI. Oropharynx is clear. Neck is nontender and supple without adenopathy or JVD. Back is nontender and there is no CVA tenderness. Lungs are clear without rales, wheezes, or rhonchi. Chest is nontender.  Pacemaker present left subclavian area. Heart has regular rate and rhythm without murmur. Abdomen is soft, flat, nontender without masses or hepatosplenomegaly and peristalsis is normoactive. Extremities have no cyanosis or edema, full range of motion is present. Skin is warm and dry without rash. Neurologic: Mental status is normal, cranial nerves are intact, there are no motor or sensory deficits.  ED Treatments / Results  Labs (all labs ordered are listed, but only abnormal results are displayed) Labs Reviewed  BASIC METABOLIC PANEL - Abnormal; Notable for the following components:      Result Value   Chloride 100 (*)    Glucose, Bld 159 (*)    Calcium 8.3 (*)    All other components within normal limits  CBC WITH DIFFERENTIAL/PLATELET - Abnormal; Notable for the following components:   Hemoglobin 11.5 (*)    HCT 35.4 (*)    All other components within normal limits  TROPONIN I - Abnormal; Notable for the following components:   Troponin I 0.04 (*)    All other components within normal limits  BRAIN NATRIURETIC PEPTIDE - Abnormal; Notable for the following components:   B Natriuretic Peptide 1,485.0 (*)    All other components within normal limits  TROPONIN I  MAGNESIUM    EKG  EKG Interpretation  Date/Time:  Friday June 28 2017  00:00:12 EST Ventricular Rate:  65 PR Interval:    QRS Duration: 204 QT Interval:  545 QTC Calculation: 567 R Axis:   -69 Text Interpretation:  A-V dual-paced rhythm with some inhibition No further analysis attempted due to paced rhythm When compared with ECG of 03/14/2016, No significant change was found Confirmed by Dione BoozeGlick, Calven Gilkes ([id number]) on 06/28/2017 12:05:52 AM       Radiology Dg Chest 2 View  Result Date: 06/28/2017 CLINICAL DATA:  84 y/o F; shortness of breath, cough, and mid lower chest pain for 2 days. EXAM: CHEST  2 VIEW COMPARISON:  03/14/2016 chest radiograph FINDINGS: Stable cardiac silhouette given projection and technique. Aortic atherosclerosis with calcification. Two lead pacemaker. Moderate right and trace left pleural effusions. Reticular opacities of the lungs. No pneumothorax. No acute osseous abnormality is evident. IMPRESSION: Moderate right and trace left pleural effusions, right basilar opacity may represent associated atelectasis or pneumonia. Reticular opacities of lungs, probably interstitial edema. Electronically Signed   By: Mitzi HansenLance  Furusawa-Stratton M.D.   On: 06/28/2017 00:45    Procedures Procedures (including critical care time)  Medications Ordered in ED Medications  ondansetron (ZOFRAN) injection 4 mg (4 mg Intravenous Given 06/28/17 0040)  aspirin chewable tablet 324 mg (324 mg Oral Given 06/28/17 0040)  nitroGLYCERIN (NITROSTAT) SL tablet 0.4 mg (0.4 mg Sublingual Given 06/28/17 0050)  furosemide (LASIX) injection 40 mg (40 mg Intravenous Given 06/28/17 0302)     Initial Impression / Assessment and Plan / ED Course  I have reviewed the triage vital signs and the nursing notes.  Pertinent labs & imaging results that were available during my care of the patient were reviewed by me and considered in my medical decision making (see chart for details).  Chest discomfort which may represent CHF or possibly angina equivalent.  ECG shows dual paced rhythm and  is not reliable for ST or T wave changes.  Old records are reviewed, and she has no recent ED visits or hospitalizations for chest pain or dyspnea.  X-ray shows significant right pleural effusion.  At this point, I suspect that heart failure is responsible for her symptoms.  She will be given aspirin and nitroglycerin as well as furosemide.  Troponin is come back borderline elevated and will need to be repeated.  BNP is markedly elevated and chest x-ray shows new right pleural effusion consistent with heart failure.  At this point, I feel all of her symptoms are heart failure and I am holding off on heparin unless troponin is climbing.  Case is discussed with Dr. Robb Matarrtiz of [facility], who agrees to admit the patient.  Final Clinical Impressions(s) / ED Diagnoses   Final diagnoses:  Acute on chronic diastolic heart failure (HCC)  Elevated troponin I level  Normochromic normocytic anemia    ED Discharge Orders    None       Dione BoozeGlick, Bernadene Garside, MD 06/28/17 [phone]

## 2017-06-28 NOTE — Evaluation (Signed)
Clinical/Bedside Swallow Evaluation Patient Details  Name: [name] [name] MRN: [id number] Date of Birth: [birth year]  Today's Date: 06/28/2017 Time: SLP Start Time (ACUTE ONLY): 1930 SLP Stop Time (ACUTE ONLY): 1945 SLP Time Calculation (min) (ACUTE ONLY): 15 min  Past Medical History:  Past Medical History:  Diagnosis Date  . ANXIETY   . Arteriosclerotic cardiovascular disease (ASCVD)    Cath in 9/99-70% mid LAD with diffuse distal disease, 70% T1, 60% mid circumflex, 50% mid RCA, normal ejection fraction; negative stress nuclear in 07/2008  . Carpal tunnel syndrome   . Cerebrovascular disease    carotid bruits; no focal disease in 1999, 2002 and 2006  . Closed left fibular fracture 11/30/11  . COPD   . Diabetes mellitus    Insulin treatment  . Diastolic CHF, chronic (HCC)    Normal EF  . Gastroesophageal reflux disease    With hiatal hernia; irritable bowel syndrome; H. pylori-treated; Colonoscopy 2008: non-specific colitis, IH, Diverticulosis, Rectal ulcer secondary to ASA  . Hyperlipidemia   . Hypertension   . Hypothyroidism   . OSTEOARTHRITIS, KNEES, BILATERAL    Bilateral TKA  . Pacemaker   . Peripheral neuropathy   . Sinoatrial node dysfunction (HCC) 2003   Medtronic dual-chamber device  . Sleep apnea   . VITAMIN B12 DEFICIENCY    Past Surgical History:  Past Surgical History:  Procedure Laterality Date  . ABDOMINAL HYSTERECTOMY    . BLADDER REPAIR    . CARDIAC PACEMAKER PLACEMENT  2003   Medtronic, dual-chamber  . CARPAL TUNNEL RELEASE    . CHOLECYSTECTOMY    . COLONOSCOPY  2008  . HEMORRHOID SURGERY    . NASAL FRACTURE SURGERY    . THYROIDECTOMY  1980   Goiter  . TOTAL KNEE ARTHROPLASTY     Bilateral   HPI:  Pt is an 84 y.o. female with PMH ofanxiety, carpal tunnel syndrome, history of CVA, history of left fibular fracture, COPD, type 2 diabetes, GERD, hyperlipidemia, hypertension, hypothyroidism, needle gastritis, peripheral neuropathy, sleep apnea,  vitamin B12 deficiency,CAD, sinoatrial node dysfunction, pacemaker placement, chronic diastolic CHF who is coming to the emergency department with complaints of shortness of breathfor the past 2 days, particularly while exerting associated with whitish/yellowish productive cough. She denies lower extremity edema, PND, but reports orthopnea and states that she has been sleeping on her recliner.She also complains of epigastric painwith nausea since yesterday, but denies emesis, diarrhea, melena orhematochezia. She denies dysuria, frequency or hematuria. The patient states that she has been having right inguinal area irritation and redness for several weeks now, despite using topical treatment. CXR showed Moderate right and trace left pleural effusions, right basilar opacity may represent associated atelectasis or pneumonia. Reticular opacities of lungs, probably interstitial edema. Bedside swallow eval ordered.   Assessment / Plan / Recommendation Clinical Impression  Pt showed no overt s/s of aspiration with thin liquids and declined attempting any other PO trials due to reflux (doesn't like to eat right before bed). Pt described frequent globus sensation/ at times feelings of "choking" while eating. She was unable to name specific foods that give her more difficulty but did not feel that this happens with liquids. Pt also very hoarse. Suspect that this is an esophageal dysphagia increasing aspiration risk/ ? if vocal folds have been affected due to reflux. Recommend continuing regular diet/ thin liquids with strict reflux precautions which were reviewed with pt. Pt would also benefit from a GI consult to further assess esophageal function. SLP will f/u x1  for diet tolerance. SLP Visit Diagnosis: Dysphagia, unspecified (R13.10)    Aspiration Risk  Mild aspiration risk    Diet Recommendation Regular;Thin liquid   Liquid Administration via: Straw Medication Administration: Whole meds with  liquid Supervision: Patient able to self feed;Intermittent supervision to cue for compensatory strategies Compensations: Slow rate;Small sips/bites;Follow solids with liquid Postural Changes: Seated upright at 90 degrees;Remain upright for at least 30 minutes after po intake    Other  Recommendations Recommended Consults: Consider GI evaluation;Consider esophageal assessment Oral Care Recommendations: Oral care BID   Follow up Recommendations None      Frequency and Duration min 1 x/week  1 week       Prognosis        Swallow Study   General HPI: Pt is an 84 y.o. female with PMH ofanxiety, carpal tunnel syndrome, history of CVA, history of left fibular fracture, COPD, type 2 diabetes, GERD, hyperlipidemia, hypertension, hypothyroidism, needle gastritis, peripheral neuropathy, sleep apnea, vitamin B12 deficiency,CAD, sinoatrial node dysfunction, pacemaker placement, chronic diastolic CHF who is coming to the emergency department with complaints of shortness of breathfor the past 2 days, particularly while exerting associated with whitish/yellowish productive cough. She denies lower extremity edema, PND, but reports orthopnea and states that she has been sleeping on her recliner.She also complains of epigastric painwith nausea since yesterday, but denies emesis, diarrhea, melena orhematochezia. She denies dysuria, frequency or hematuria. The patient states that she has been having right inguinal area irritation and redness for several weeks now, despite using topical treatment. CXR showed Moderate right and trace left pleural effusions, right basilar opacity may represent associated atelectasis or pneumonia. Reticular opacities of lungs, probably interstitial edema. Bedside swallow eval ordered. Type of Study: Bedside Swallow Evaluation Previous Swallow Assessment: none in chart Diet Prior to this Study: Regular;Thin liquids Temperature Spikes Noted: No Respiratory Status: Room  air History of Recent Intubation: No Behavior/Cognition: Alert;Cooperative;Pleasant mood Oral Cavity Assessment: Within Functional Limits Oral Care Completed by SLP: No Oral Cavity - Dentition: Adequate natural dentition Vision: Functional for self-feeding Self-Feeding Abilities: Able to feed self Patient Positioning: Upright in bed Baseline Vocal Quality: Hoarse Volitional Cough: Strong Volitional Swallow: Able to elicit    Oral/Motor/Sensory Function Overall Oral Motor/Sensory Function: Within functional limits   Ice Chips Ice chips: Not tested   Thin Liquid Thin Liquid: Within functional limits    Nectar Thick Nectar Thick Liquid: Not tested   Honey Thick Honey Thick Liquid: Not tested   Puree Puree: Not tested   Solid   GO   Solid: Not tested(refused)        Kyndle Schlender Cecille Aver, MA, CCC-SLP 06/28/2017,7:51 PM

## 2017-06-28 NOTE — H&P (Signed)
History and Physical    [name] [name] ZOX:[id number] DOB: 1932-10-05 DOA: 06/27/2017  PCP: Junie Spencer, FNP   Patient coming from: Home.  I have personally briefly reviewed patient's old medical records in [facility]  Chief Complaint: Shortness of breath.  HPI: [name] [name] is a 84 y.o. female with medical history significant of anxiety, carpal tunnel syndrome, history of CVA, history of left fibular fracture, COPD, type 2 diabetes, GERD, hyperlipidemia, hypertension, hypothyroidism, needle gastritis, peripheral neuropathy, sleep apnea, vitamin B12 deficiency, CAD, sinoatrial node dysfunction, pacemaker placement, chronic diastolic CHF who is coming to the emergency department with complaints of shortness of breath for the past 2 days, particularly while exerting associated with whitish/yellowish productive cough.  She denies lower extremity edema, PND, but reports orthopnea and states that she has been sleeping on her recliner.  No chest pain, palpitations, dizziness or diaphoresis.  She denies fever, chills, sore throat, wheezing or hemoptysis.    She also complains of epigastric pain with nausea since yesterday, but denies emesis, diarrhea, melena or hematochezia.  She denies dysuria, frequency or hematuria.  The patient states that she has been having right inguinal area irritation and redness for several weeks now, despite using topical treatment.  She states that her blood glucose has been under control and denies polyuria, polydipsia or blurred vision.  She mentions that she has lost over 70 pounds since June last year.  ED Course: Initial vital signs temperature 36.6C (97.8 F), pulse 68, respiration 18, blood pressure 191/75 mmHg and O2 sat 92% on room air.  Her EKG is unchanged.  BNP was 1485.0 pg/mL.  Troponin was mildly elevated at 0.04 ng/mL.  Her CBC showed a white count of 6.5 with a normal differential, hemoglobin of 11.5 g/dL and platelets 811.  BMP shows sodium  of 136, potassium 3.6, chloride 100 and CO2 27 millimolar/L with a normal anion gap.  BUN was 15, creatinine 0.77, calcium 8.3 and glucose 159 mg/dL.    Her two-view chest radiograph was reported as moderate right and trace left pleural effusions, right basilar opacity may represent associated atelectasis or pneumonia.  Reticular opacities of lungs, probably interstitial edema.  Please see images and full radiology report for further detail.  Review of Systems: As per HPI otherwise 10 point review of systems negative.    Past Medical History:  Diagnosis Date  . ANXIETY   . Arteriosclerotic cardiovascular disease (ASCVD)    Cath in 9/99-70% mid LAD with diffuse distal disease, 70% T1, 60% mid circumflex, 50% mid RCA, normal ejection fraction; negative stress nuclear in 07/2008  . Carpal tunnel syndrome   . Cerebrovascular disease    carotid bruits; no focal disease in 1999, 2002 and 2006  . Closed left fibular fracture 11/30/11  . COPD   . Diabetes mellitus    Insulin treatment  . Diastolic CHF, chronic (HCC)    Normal EF  . Gastroesophageal reflux disease    With hiatal hernia; irritable bowel syndrome; H. pylori-treated; Colonoscopy 2008: non-specific colitis, IH, Diverticulosis, Rectal ulcer secondary to ASA  . Hyperlipidemia   . Hypertension   . Hypothyroidism   . OSTEOARTHRITIS, KNEES, BILATERAL    Bilateral TKA  . Pacemaker   . Peripheral neuropathy   . Sinoatrial node dysfunction (HCC) 2003   Medtronic dual-chamber device  . Sleep apnea   . VITAMIN B12 DEFICIENCY     Past Surgical History:  Procedure Laterality Date  . ABDOMINAL HYSTERECTOMY    .  BLADDER REPAIR    . CARDIAC PACEMAKER PLACEMENT  2003   Medtronic, dual-chamber  . CARPAL TUNNEL RELEASE    . CHOLECYSTECTOMY    . COLONOSCOPY  2008  . HEMORRHOID SURGERY    . NASAL FRACTURE SURGERY    . THYROIDECTOMY  1980   Goiter  . TOTAL KNEE ARTHROPLASTY     Bilateral     reports that  has never smoked. she has  never used smokeless tobacco. She reports that she does not drink alcohol or use drugs.  Allergies  Allergen Reactions  . Penicillins     Family History  Problem Relation Age of Onset  . COPD Father   . Heart attack Father 94  . Heart failure Mother   . Cancer Sister        colon  . Cancer Brother        lung  . Cancer Sister        bladder  . Cancer Brother        lung  . Early death Son   . Diabetes Son   . Heart disease Son   . Cancer Son        prostate  . Cancer Sister        kidney cancer  . Diabetes Sister   . Cancer Daughter     Prior to Admission medications   Medication Sig Start Date End Date Taking? Authorizing Provider  ALPRAZolam Prudy Feeler) 0.5 MG tablet Take 1 tablet (0.5 mg total) by mouth at bedtime as needed for anxiety. 05/14/17   Jannifer Rodney A, FNP  aspirin EC 81 MG tablet Take 81 mg by mouth daily. Reported on 12/14/2015    [provider]  ASSURE COMFORT LANCETS 30G MISC  07/16/14   [provider]  budesonide-formoterol (SYMBICORT) 80-4.5 MCG/ACT inhaler Inhale 2 puffs into the lungs 2 (two) times daily. 03/06/16   Junie Spencer, FNP  cyclobenzaprine (FLEXERIL) 10 MG tablet Take 1 tablet by mouth 3 (three) times daily as needed f or muscle spasms. 02/07/17   Junie Spencer, FNP  furosemide (LASIX) 40 MG tablet Take 0.5-1 tablets (20-40 mg total) by mouth daily as needed. Reported on 12/14/2015 03/28/16   Henrene Pastor, PharmD  gabapentin (NEURONTIN) 300 MG capsule TAKE (1) CAPSULE THREE TIMES DAILY. 06/13/17   Jannifer Rodney A, FNP  glimepiride (AMARYL) 4 MG tablet TAKE (1) TABLET DAILY BE- FORE BREAKFAST. 05/29/17   Jannifer Rodney A, FNP  glucose blood test strip Use TID and prn 05/23/15   Jannifer Rodney A, FNP  HYDROcodone-acetaminophen (NORCO/VICODIN) 5-325 MG tablet TAKE (1) TABLET EVERY SIX HOURS AS NEEDED FOR MODERATE PAIN. 01/28/17   Jannifer Rodney A, FNP  HYDROcodone-acetaminophen (NORCO/VICODIN) 5-325 MG tablet TAKE (1) TABLET  EVERY SIX HOURS AS NEEDED FOR MODERATE PAIN 05/14/17   Jannifer Rodney A, FNP  HYDROcodone-acetaminophen (NORCO/VICODIN) 5-325 MG tablet Take 1 tablet by mouth every 6 (six) hours as needed for moderate pain. 05/14/17   Jannifer Rodney A, FNP  insulin NPH Human (HUMULIN N) 100 UNIT/ML injection Inject 0.08-0.1 mLs (8-10 Units total) into the skin 2 (two) times daily before a meal. 01/14/17   Henrene Pastor, PharmD  levothyroxine (SYNTHROID, LEVOTHROID) 200 MCG tablet TAKE (1) TABLET DAILY BE- FORE BREAKFAST. 03/29/17   Junie Spencer, FNP  levothyroxine (SYNTHROID, LEVOTHROID) 25 MCG tablet Take 1 tablet (25 mcg total) by mouth daily before breakfast. 01/29/17   Jannifer Rodney A, FNP  lisinopril (PRINIVIL,ZESTRIL) 10 MG tablet TAKE 1  TABLET DAILY 11/26/16   Jannifer Rodney A, FNP  lisinopril (PRINIVIL,ZESTRIL) 10 MG tablet TAKE 1 TABLET DAILY 05/29/17   Jannifer Rodney A, FNP  meloxicam (MOBIC) 15 MG tablet TAKE 1 TABLET DAILY 06/13/17   Jannifer Rodney A, FNP  metFORMIN (GLUCOPHAGE) 1000 MG tablet TAKE (1) TABLET TWICE A DAY WITH MEALS (BREAKFAST AND SUPPER) FOR DIAB ETES 06/17/17   Jannifer Rodney A, FNP  metoprolol succinate (TOPROL-XL) 25 MG 24 hr tablet Take 1 tablet (25 mg total) by mouth daily. Take with or immediately following a meal. 06/18/17   Hawks, Neysa Bonito A, FNP  Multiple Vitamins-Minerals (CENTRUM WOMEN PO) Take 1 tablet by mouth daily.    [provider]  nitroGLYCERIN (NITROLINGUAL) 0.4 MG/SPRAY spray Place 1 spray under the tongue every 5 (five) minutes as needed. 10/28/15   Mechele Claude, MD  omeprazole (PRILOSEC) 20 MG capsule Take 1 capsule (20 mg total) by mouth 2 (two) times daily before a meal. 05/14/17   Hawks, Edilia Bo, FNP  ondansetron (ZOFRAN ODT) 4 MG disintegrating tablet Take 1 tablet (4 mg total) by mouth every 8 (eight) hours as needed for nausea or vomiting. 03/01/17   Junie Spencer, FNP  [facility] VERIO test strip Use as instructed 05/30/16   Junie Spencer, FNP    pravastatin (PRAVACHOL) 80 MG tablet TAKE 1 TABLET ONCE DAILY FOR CHOLESTEROL 05/29/17   Junie Spencer, FNP  PROAIR HFA 108 [phone] Base) MCG/ACT inhaler 2 PUFFS EVERY 6 HOURS AS NEEDED FOR WHEEZING 01/12/16   Jannifer Rodney A, FNP  sertraline (ZOLOFT) 50 MG tablet TAKE 1 TABLET DAILY 05/29/17   Junie Spencer, FNP  SURE COMFORT INSULIN SYRINGE 31G X 5/16" 0.3 ML MISC  07/16/14   [provider]    Physical Exam: Vitals:   06/28/17 0042 06/28/17 0047 06/28/17 0100 06/28/17 0300  BP: 132/63 (!) 141/59 (!) 172/57 (!) 180/72  Pulse: 63 62 60 60  Resp: 15 17 (!) 22 (!) 21  Temp:      SpO2: (!) 89% (!) 88% 91% 97%  Weight:      Height:        Constitutional: NAD, calm, comfortable Eyes: PERRL, lids and conjunctivae normal ENMT: Mucous membranes are moist. Posterior pharynx clear of any exudate or lesions. Neck: normal, supple, no masses, no thyromegaly Respiratory: Decreased breath sounds on bases, otherwise clear to auscultation bilaterally, no wheezing, no crackles. Normal respiratory effort. No accessory muscle use.  Cardiovascular: Regular rate and rhythm, no murmurs / rubs / gallops. Trace bilateral lower extremity pitting edema. 2+ pedal pulses. No carotid bruits.  Abdomen: no tenderness, no masses palpated. No hepatosplenomegaly. Bowel sounds positive.  Musculoskeletal: no clubbing / cyanosis. Good ROM, no contractures. Normal muscle tone.  Skin: Positive right inguinal area erythema.  Please see below picture. Neurologic: CN 2-12 grossly intact. Sensation intact, DTR normal. Strength 5/5 in all 4.  Psychiatric: Normal judgment and insight. Alert and oriented x 3. Normal mood.       Labs on Admission: I have personally reviewed following labs and imaging studies  CBC: Recent Labs  Lab 06/28/17 0111  WBC 6.5  NEUTROABS 4.3  HGB 11.5*  HCT 35.4*  MCV 89.6  PLT 194   Basic Metabolic Panel: Recent Labs  Lab 06/28/17 0111  NA 136  K 3.6  CL 100*  CO2 27   GLUCOSE 159*  BUN 15  CREATININE 0.77  CALCIUM 8.3*   GFR: Estimated Creatinine Clearance: 64.5 mL/min (by C-G formula  based on SCr of 0.77 mg/dL). Liver Function Tests: No results for input(s): AST, ALT, ALKPHOS, BILITOT, PROT, ALBUMIN in the last 168 hours. No results for input(s): LIPASE, AMYLASE in the last 168 hours. No results for input(s): AMMONIA in the last 168 hours. Coagulation Profile: No results for input(s): INR, PROTIME in the last 168 hours. Cardiac Enzymes: Recent Labs  Lab 06/28/17 0111  TROPONINI 0.04*   BNP (last 3 results) No results for input(s): PROBNP in the last 8760 hours. HbA1C: No results for input(s): HGBA1C in the last 72 hours. CBG: No results for input(s): GLUCAP in the last 168 hours. Lipid Profile: No results for input(s): CHOL, HDL, LDLCALC, TRIG, CHOLHDL, LDLDIRECT in the last 72 hours. Thyroid Function Tests: No results for input(s): TSH, T4TOTAL, FREET4, T3FREE, THYROIDAB in the last 72 hours. Anemia Panel: No results for input(s): VITAMINB12, FOLATE, FERRITIN, TIBC, IRON, RETICCTPCT in the last 72 hours. Urine analysis:    Component Value Date/Time   COLORURINE LT. YELLOW 12/23/2012 1110   APPEARANCEUR Clear 08/26/2015 1540   LABSPEC <=1.005 12/23/2012 1110   PHURINE 6.5 12/23/2012 1110   GLUCOSEU Negative 08/26/2015 1540   GLUCOSEU NEGATIVE 12/23/2012 1110   HGBUR TRACE-INTACT 12/23/2012 1110   HGBUR large 05/16/2010 0746   BILIRUBINUR Negative 08/26/2015 1540   KETONESUR NEGATIVE 12/23/2012 1110   PROTEINUR Negative 08/26/2015 1540   PROTEINUR NEGATIVE 12/25/2009 2356   UROBILINOGEN negative 07/05/2015 1431   UROBILINOGEN 0.2 12/23/2012 1110   NITRITE Negative 08/26/2015 1540   NITRITE NEGATIVE 12/23/2012 1110   LEUKOCYTESUR Negative 08/26/2015 1540    Radiological Exams on Admission: Dg Chest 2 View  Result Date: 06/28/2017 CLINICAL DATA:  84 y/o F; shortness of breath, cough, and mid lower chest pain for 2 days.  EXAM: CHEST  2 VIEW COMPARISON:  03/14/2016 chest radiograph FINDINGS: Stable cardiac silhouette given projection and technique. Aortic atherosclerosis with calcification. Two lead pacemaker. Moderate right and trace left pleural effusions. Reticular opacities of the lungs. No pneumothorax. No acute osseous abnormality is evident. IMPRESSION: Moderate right and trace left pleural effusions, right basilar opacity may represent associated atelectasis or pneumonia. Reticular opacities of lungs, probably interstitial edema. Electronically Signed   By: Mitzi Hansen M.D.   On: 06/28/2017 00:45    EKG: Independently reviewed. Vent. rate 65 BPM PR interval * ms QRS duration 204 ms QT/QTc 545/567 ms P-R-T axes -35 -69 100 A-V dual-paced rhythm with some inhibition No further analysis attempted due to paced rhythm No significant change when compared to previous tracing.  Assessment/Plan Principal Problem:   Acute on chronic diastolic congestive heart failure (HCC) Observation/telemetry. Continue supplemental oxygen. Fluid restriction. Daily weights. Monitor intake and output. Furosemide 80 mg IVP twice daily. Trend troponin level. Check echocardiogram.  Active Problems:   Elevated troponin Likely due to minimal demand ischemia in the setting of acute CHF. Continue above treatment. Trend troponin level. Check echocardiogram    Tinea cruris We will start oral and topical antifungal (pending hepatic function panel). I advised the patient to keep close blood glucose control.    Arteriosclerotic cardiovascular disease (ASCVD) Continue low-dose aspirin. Continue metoprolol 25 mg po daily Continue Pravastatin 80 mg po daily.    Hypothyroid Continue levothyroxine 225 mcg p.o. daily. Check TSH as needed.    Mixed hyperlipidemia Continue pravastatin 80 mg p.o. daily. Monitor LFTs as needed. Fasting lipid panel to be follow by PCP as an outpatient.    GAD (generalized anxiety  disorder) Continue sertraline 50 mg p.o. daily.  Continue alprazolam 0.5 mg p.o. at bedtime.    Hypertension associated with diabetes (HCC) Continue lisinopril 10 mg po daily. Continue metoprolol succinate 25 mg po daily.    Chronic obstructive pulmonary disease (HCC) Continue supplemental oxygen. Bronchodilators as needed.    Gastroesophageal reflux disease Pantoprazole 40 mg p.o. daily.    Type II diabetes mellitus (HCC) Per patient, her diabetes is controlled. Last hemoglobin A1c was 6.4% in February 2017 Carbohydrate modified diet. Continue NPH 8-10 units twice daily. Continue Amaryl 4 mg p.o. daily. Continue metformin 1000 mg p.o. twice daily. CBG monitoring with regular insulin sliding scale in the hospital.    Normocytic anemia Anemia panel done in October 2018 showed decreased B12 level. Iron studies and folic acid level were unremarkable.    Sleep apnea Not on CPAP.    DVT prophylaxis: Lovenox SQ. Code Status: Full code. Family Communication:  Disposition Plan: Observation for CHF exacerbation treatment and further evaluation. Consults called:  Admission status: Observation/telemetry.   Bobette Moavid Manuel Ortiz MD [facility] Pager [phone].  If 7PM-7AM, please contact night-coverage [email or web address] Password [email or web address]  06/28/2017, 3:52 AM

## 2017-06-28 NOTE — ED Notes (Signed)
Date and time results received: 06/28/17 0203 (use smartphrase ".now" to insert current time)  Test: troponin Critical Value: 0.04  Name of Provider Notified: Dr Preston FleetingGlick  Orders Received? Or Actions Taken?: Actions Taken: no orders received

## 2017-06-29 DIAGNOSIS — E039 Hypothyroidism, unspecified: Secondary | ICD-10-CM

## 2017-06-29 DIAGNOSIS — F411 Generalized anxiety disorder: Secondary | ICD-10-CM

## 2017-06-29 DIAGNOSIS — L8990 Pressure ulcer of unspecified site, unspecified stage: Secondary | ICD-10-CM

## 2017-06-29 DIAGNOSIS — D649 Anemia, unspecified: Secondary | ICD-10-CM

## 2017-06-29 DIAGNOSIS — K210 Gastro-esophageal reflux disease with esophagitis: Secondary | ICD-10-CM

## 2017-06-29 DIAGNOSIS — J449 Chronic obstructive pulmonary disease, unspecified: Secondary | ICD-10-CM

## 2017-06-29 DIAGNOSIS — I2510 Atherosclerotic heart disease of native coronary artery without angina pectoris: Secondary | ICD-10-CM

## 2017-06-29 DIAGNOSIS — R748 Abnormal levels of other serum enzymes: Secondary | ICD-10-CM

## 2017-06-29 LAB — MAGNESIUM: Magnesium: 1.4 mg/dL — ABNORMAL LOW (ref 1.7–2.4)

## 2017-06-29 LAB — BASIC METABOLIC PANEL
ANION GAP: 9 (ref 5–15)
BUN: 14 mg/dL (ref 6–20)
CHLORIDE: 101 mmol/L (ref 101–111)
CO2: 29 mmol/L (ref 22–32)
Calcium: 8.4 mg/dL — ABNORMAL LOW (ref 8.9–10.3)
Creatinine, Ser: 0.74 mg/dL (ref 0.44–1.00)
GFR calc non Af Amer: >60 mL/min (ref >60)
Glucose, Bld: 108 mg/dL — ABNORMAL HIGH (ref 65–99)
POTASSIUM: 3.7 mmol/L (ref 3.5–5.1)
Sodium: 139 mmol/L (ref 135–145)

## 2017-06-29 LAB — GLUCOSE, CAPILLARY
GLUCOSE-CAPILLARY: 92 mg/dL (ref 65–99)
GLUCOSE-CAPILLARY: 179 mg/dL — AB (ref 65–99)

## 2017-06-29 MED ORDER — POTASSIUM CHLORIDE ER 20 MEQ PO TBCR: 20.0000 meq | EXTENDED_RELEASE_TABLET | Freq: Every day | ORAL | 0 refills | Status: DC

## 2017-06-29 MED ORDER — CLOTRIMAZOLE 1 % EX CREA: TOPICAL_CREAM | CUTANEOUS | 0 refills | Status: DC

## 2017-06-29 MED ORDER — MAGNESIUM SULFATE 2 GM/50ML IV SOLN
2.0000 g | Freq: Once | INTRAVENOUS | Status: AC
  Administered 2017-06-29: 2 g via INTRAVENOUS
  Filled 2017-06-29: qty 50

## 2017-06-29 MED ORDER — INSULIN NPH (HUMAN) (ISOPHANE) 100 UNIT/ML ~~LOC~~ SUSP: 8.0000 [IU] | Freq: Two times a day (BID) | SUBCUTANEOUS | Status: DC

## 2017-06-29 MED ORDER — FLUCONAZOLE 100 MG PO TABS: 100.0000 mg | ORAL_TABLET | Freq: Every day | ORAL | 0 refills | Status: AC

## 2017-06-29 MED ORDER — FUROSEMIDE 40 MG PO TABS: 40.0000 mg | ORAL_TABLET | Freq: Every day | ORAL | 4 refills | Status: DC

## 2017-06-29 MED ORDER — MELOXICAM 7.5 MG PO TABS: 7.5000 mg | ORAL_TABLET | Freq: Every day | ORAL | 0 refills | Status: DC | PRN

## 2017-06-29 MED ORDER — FLUCONAZOLE 100 MG PO TABS
200.0000 mg | ORAL_TABLET | Freq: Once | ORAL | Status: AC
  Administered 2017-06-29: 200 mg via ORAL
  Filled 2017-06-29: qty 2

## 2017-06-29 NOTE — Progress Notes (Signed)
Patient discharged home with belongings, IV removed and site intact.

## 2017-06-29 NOTE — Care Management Note (Signed)
Case Management Note  Patient Details  Name: [name] [name] MRN: [id number] Date of Birth: 19-Feb-1933  Subjective/Objective:          Spoke w patient over the phone. [facility]. Patient denies need for DME. Patient requested to have CM speak to daughter, to notify her that she neds a ride home. CM left message with daughter. No other CM needs identified. Lawerance SabalDebbie Amaree Loisel RN [place] [phone]          Action/Plan:   Expected Discharge Date:  06/29/17               Expected Discharge Plan:  Home w Home Health Services  In-House Referral:     Discharge planning Services  CM Consult  Post Acute Care Choice:  Home Health Choice offered to:  Patient  DME Arranged:    DME Agency:     HH Arranged:  RN, PT, Social Work [facility] Agency:  [facility]  Status of Service:  Completed, signed off  If discussed at [employer] Meetings, dates discussed:    Additional Comments:  Lawerance SabalDebbie Analis Distler, RN 06/29/2017, 11:54 AM

## 2017-06-29 NOTE — Discharge Summary (Signed)
Physician Discharge Summary  Allean Foundda M Droke ZOX:[id number] DOB: 1933/02/24 DOA: 06/27/2017  PCP: Junie SpencerHawks, Christy A, FNP  Admit date: 06/27/2017 Discharge date: 06/29/2017  Admitted From: Home  Disposition: Home with home health services  Recommendations for Outpatient Follow-up:  1. Follow up with PCP in 1 weeks 2. Follow up with cardiologist in 2 weeks 3. Please refer to GI for outpatient GI eval (anemia and weight loss) 4. Please obtain BMP/CBC/Magnesium in one week  Home Health: YES, RN, PT, SW  Discharge Condition: STABLE  CODE STATUS: FULL    Brief Hospitalization Summary: Please see all hospital notes, images, labs for full details of the hospitalization. HPI: [name] [name] is a 84 y.o. female with medical history significant of anxiety, carpal tunnel syndrome, history of CVA, history of left fibular fracture, COPD, type 2 diabetes, GERD, hyperlipidemia, hypertension, hypothyroidism, needle gastritis, peripheral neuropathy, sleep apnea, vitamin B12 deficiency, CAD, sinoatrial node dysfunction, pacemaker placement, chronic diastolic CHF who is coming to the emergency department with complaints of shortness of breath for the past 2 days, particularly while exerting associated with whitish/yellowish productive cough.  She denies lower extremity edema, PND, but reports orthopnea and states that she has been sleeping on her recliner.  No chest pain, palpitations, dizziness or diaphoresis.  She denies fever, chills, sore throat, wheezing or hemoptysis.    She also complains of epigastric pain with nausea since yesterday, but denies emesis, diarrhea, melena or hematochezia.  She denies dysuria, frequency or hematuria.  The patient states that she has been having right inguinal area irritation and redness for several weeks now, despite using topical treatment.  She states that her blood glucose has been under control and denies polyuria, polydipsia or blurred vision.  She mentions that she has lost  over 70 pounds since June last year.  ED Course: Initial vital signs temperature 36.6C (97.8 F), pulse 68, respiration 18, blood pressure 191/75 mmHg and O2 sat 92% on room air.  Her EKG is unchanged.  BNP was 1485.0 pg/mL.  Troponin was mildly elevated at 0.04 ng/mL.  Her CBC showed a white count of 6.5 with a normal differential, hemoglobin of 11.5 g/dL and platelets [id number].  BMP shows sodium of 136, potassium 3.6, chloride 100 and CO2 27 millimolar/L with a normal anion gap.  BUN was 15, creatinine 0.77, calcium 8.3 and glucose 159 mg/dL.    Her two-view chest radiograph was reported as moderate right and trace left pleural effusions, right basilar opacity may represent associated atelectasis or pneumonia.  Reticular opacities of lungs, probably interstitial edema.  Please see images and full radiology report for further detail.   Acute on chronic diastolic congestive heart failure (HCC) Treated with IV Lasix 40 mg twice a day. Echocardiogram below.  The estimated ejection fraction was in the  range of 35% to 40%. Diffuse hypokinesis. Doppler parameters are  consistent with abnormal left ventricular relaxation (grade 1  diastolic dysfunction). Pt diuresed and felt much better.  She is discharged home on lasix 40 mg daily with oral potassium supplement.  She is on toprol XL and lisinopril.    Dysphagia and weight loss Reports choking on food and almost 60 lbs unintentional  wt loss in 5-6 months. SLP evaluated patient and recommended regular consistency diet, thin liquids.   Elevated troponin Likely demand ischemia with CHF. No chest pain symptoms.  Monitored on telemetry.  Anemia Check stool for hemoccult. hb from 2 years back was normal at 14. Recent iron panel unremarkable. Low B12.  Added cyanocobalamin. GI eval outpt. Recommended. Further work up outpatient with PCP.   Significant weight loss Reports 70lb weight loss in 6 months. Noted some drop in H&H from baseline. Check stool  for Hemoccult. Needs outpatient GI evaluation.  Tinea cruris Added topical antifungal and oral fluconazole for 4 more days.    Hypothyroid continue synthroid. recent TSH normal   type 2 diabetes mellitus, controlled (HCC) Continue NPH twice a day, Amaryl and hold metformin. Resume home regimen at discharge  COPD Stable. Bronchodilators as needed. Not on home O2.  If qualifies, will arrange at discharge.   Denies anxiety disorder Continue Xanax and Zoloft.  Mixed hyperlipidemia   continue statin.  Hypomagnesemia Replenished IV. Recheck in 1 week with PCP.    Code Status : full code  family communication: None present during rounds   Disposition Plan Home with home health services.  Barriers For Discharge :  active symptoms   ECHOCARDIOGRAM 06/2017 ------------------------------------------------------------------- Study Conclusions  - Left ventricle: The cavity size was normal. Wall thickness was   increased in a pattern of moderate LVH. Systolic function was   moderately reduced. The estimated ejection fraction was in the   range of 35% to 40%. Diffuse hypokinesis. Doppler parameters are   consistent with abnormal left ventricular relaxation (grade 1   diastolic dysfunction). Doppler parameters are consistent with   both elevated ventricular end-diastolic filling pressure and   elevated left atrial filling pressure. - Mitral valve: Moderately calcified posterior annulus. There was   mild regurgitation. - Left atrium: The atrium was moderately to severely dilated. - Right ventricle: Pacer wire or catheter noted in right ventricle. - Right atrium: The atrium was mildly dilated. Pacer wire or   catheter noted in right atrium. - Atrial septum: No defect or patent foramen ovale was identified. - Tricuspid valve: There was mild regurgitation. - Pulmonary arteries: PA peak pressure: 42 mm Hg (S).  Discharge Diagnoses:  Principal Problem:   Acute on  chronic diastolic congestive heart failure (HCC) Active Problems:   Hypothyroid   Mixed hyperlipidemia   GAD (generalized anxiety disorder)   Hypertension associated with diabetes (HCC)   Arteriosclerotic cardiovascular disease (ASCVD)   Chronic obstructive pulmonary disease (HCC)   Gastroesophageal reflux disease   Type II diabetes mellitus (HCC)   Elevated troponin   Normocytic anemia   Sleep apnea   Tinea cruris   Acute on chronic diastolic (congestive) heart failure (HCC)   Pressure injury of skin  Discharge Instructions: Discharge Instructions    (HEART FAILURE PATIENTS) Call MD:  Anytime you have any of the following symptoms: 1) 3 pound weight gain in 24 hours or 5 pounds in 1 week 2) shortness of breath, with or without a dry hacking cough 3) swelling in the hands, feet or stomach 4) if you have to sleep on extra pillows at night in order to breathe.   Complete by:  As directed    Call MD for:  difficulty breathing, headache or visual disturbances   Complete by:  As directed    Call MD for:  extreme fatigue   Complete by:  As directed    Call MD for:  persistant dizziness or light-headedness   Complete by:  As directed    Call MD for:  persistant nausea and vomiting   Complete by:  As directed    Call MD for:  severe uncontrolled pain   Complete by:  As directed    Diet - low sodium heart healthy  Complete by:  As directed    Increase activity slowly   Complete by:  As directed      Allergies as of 06/29/2017      Reactions   Penicillins    Has patient had a PCN reaction causing immediate rash, facial/tongue/throat swelling, SOB or lightheadedness with hypotension: Unknown Has patient had a PCN reaction causing severe rash involving mucus membranes or skin necrosis: Unknown Has patient had a PCN reaction that required hospitalization: Unknown Has patient had a PCN reaction occurring within the last 10 years: No If all of the above answers are "NO", then may proceed  with Cephalosporin use.      Medication List    TAKE these medications   ALPRAZolam 0.5 MG tablet Commonly known as:  XANAX Take 1 tablet (0.5 mg total) by mouth at bedtime as needed for anxiety.   aspirin EC 81 MG tablet Take 81 mg by mouth daily. Reported on 12/14/2015   budesonide-formoterol 80-4.5 MCG/ACT inhaler Commonly known as:  SYMBICORT Inhale 2 puffs into the lungs 2 (two) times daily.   clotrimazole 1 % cream Commonly known as:  LOTRIMIN Apply to affected right groin area twice daily   cyclobenzaprine 10 MG tablet Commonly known as:  FLEXERIL Take 1 tablet by mouth 3 (three) times daily as needed f or muscle spasms.   fluconazole 100 MG tablet Commonly known as:  DIFLUCAN Take 1 tablet (100 mg total) by mouth daily for 4 days. Start taking on:  06/30/2017   furosemide 40 MG tablet Commonly known as:  LASIX Take 1 tablet (40 mg total) by mouth daily. Reported on 12/14/2015 Start taking on:  06/30/2017 What changed:    how much to take  when to take this  reasons to take this   gabapentin 300 MG capsule Commonly known as:  NEURONTIN TAKE (1) CAPSULE THREE TIMES DAILY.   glimepiride 4 MG tablet Commonly known as:  AMARYL TAKE (1) TABLET DAILY BE- FORE BREAKFAST.   HYDROcodone-acetaminophen 5-325 MG tablet Commonly known as:  NORCO/VICODIN Take 1 tablet by mouth every 6 (six) hours as needed for moderate pain.   insulin NPH Human 100 UNIT/ML injection Commonly known as:  HUMULIN N Inject 0.08 mLs (8 Units total) into the skin 2 (two) times daily before a meal.   levothyroxine 200 MCG tablet Commonly known as:  SYNTHROID, LEVOTHROID TAKE (1) TABLET DAILY BE- FORE BREAKFAST.   lisinopril 10 MG tablet Commonly known as:  PRINIVIL,ZESTRIL TAKE 1 TABLET DAILY   meloxicam 7.5 MG tablet Commonly known as:  MOBIC Take 1 tablet (7.5 mg total) by mouth daily as needed for pain. What changed:    medication strength  how much to take  when to take  this  reasons to take this   metFORMIN 1000 MG tablet Commonly known as:  GLUCOPHAGE TAKE (1) TABLET TWICE A DAY WITH MEALS (BREAKFAST AND SUPPER) FOR DIAB ETES   metoprolol succinate 25 MG 24 hr tablet Commonly known as:  TOPROL-XL Take 1 tablet (25 mg total) by mouth daily. Take with or immediately following a meal. What changed:    how much to take  additional instructions   nitroGLYCERIN 0.4 MG/SPRAY spray Commonly known as:  NITROLINGUAL Place 1 spray under the tongue every 5 (five) minutes as needed.   ondansetron 4 MG disintegrating tablet Commonly known as:  ZOFRAN ODT Take 1 tablet (4 mg total) by mouth every 8 (eight) hours as needed for nausea or vomiting.   Potassium  Chloride ER 20 MEQ Tbcr Take 20 mEq by mouth daily.   pravastatin 80 MG tablet Commonly known as:  PRAVACHOL TAKE 1 TABLET ONCE DAILY FOR CHOLESTEROL   PROAIR HFA 108 (90 Base) MCG/ACT inhaler Generic drug:  albuterol 2 PUFFS EVERY 6 HOURS AS NEEDED FOR WHEEZING   sertraline 50 MG tablet Commonly known as:  ZOLOFT TAKE 1 TABLET DAILY      Follow-up Information    Junie Spencer, FNP. Schedule an appointment as soon as possible for a visit in 1 week(s).   Specialty:  Family Medicine Contact information: [address] [place] [address] [phone]        Marinus Maw, MD. Schedule an appointment as soon as possible for a visit in 2 week(s).   Specialty:  Cardiology Contact information: [address] [place] [address] [phone]          Allergies  Allergen Reactions  . Penicillins     Has patient had a PCN reaction causing immediate rash, facial/tongue/throat swelling, SOB or lightheadedness with hypotension: Unknown Has patient had a PCN reaction causing severe rash involving mucus membranes or skin necrosis: Unknown Has patient had a PCN reaction that required hospitalization: Unknown Has patient had a PCN reaction occurring within  the last 10 years: No If all of the above answers are "NO", then may proceed with Cephalosporin use.    Allergies as of 06/29/2017      Reactions   Penicillins    Has patient had a PCN reaction causing immediate rash, facial/tongue/throat swelling, SOB or lightheadedness with hypotension: Unknown Has patient had a PCN reaction causing severe rash involving mucus membranes or skin necrosis: Unknown Has patient had a PCN reaction that required hospitalization: Unknown Has patient had a PCN reaction occurring within the last 10 years: No If all of the above answers are "NO", then may proceed with Cephalosporin use.      Medication List    TAKE these medications   ALPRAZolam 0.5 MG tablet Commonly known as:  XANAX Take 1 tablet (0.5 mg total) by mouth at bedtime as needed for anxiety.   aspirin EC 81 MG tablet Take 81 mg by mouth daily. Reported on 12/14/2015   budesonide-formoterol 80-4.5 MCG/ACT inhaler Commonly known as:  SYMBICORT Inhale 2 puffs into the lungs 2 (two) times daily.   clotrimazole 1 % cream Commonly known as:  LOTRIMIN Apply to affected right groin area twice daily   cyclobenzaprine 10 MG tablet Commonly known as:  FLEXERIL Take 1 tablet by mouth 3 (three) times daily as needed f or muscle spasms.   fluconazole 100 MG tablet Commonly known as:  DIFLUCAN Take 1 tablet (100 mg total) by mouth daily for 4 days. Start taking on:  06/30/2017   furosemide 40 MG tablet Commonly known as:  LASIX Take 1 tablet (40 mg total) by mouth daily. Reported on 12/14/2015 Start taking on:  06/30/2017 What changed:    how much to take  when to take this  reasons to take this   gabapentin 300 MG capsule Commonly known as:  NEURONTIN TAKE (1) CAPSULE THREE TIMES DAILY.   glimepiride 4 MG tablet Commonly known as:  AMARYL TAKE (1) TABLET DAILY BE- FORE BREAKFAST.   HYDROcodone-acetaminophen 5-325 MG tablet Commonly known as:  NORCO/VICODIN Take 1 tablet by mouth every  6 (six) hours as needed for moderate pain.   insulin NPH Human 100 UNIT/ML injection Commonly known as:  HUMULIN N  Inject 0.08 mLs (8 Units total) into the skin 2 (two) times daily before a meal.   levothyroxine 200 MCG tablet Commonly known as:  SYNTHROID, LEVOTHROID TAKE (1) TABLET DAILY BE- FORE BREAKFAST.   lisinopril 10 MG tablet Commonly known as:  PRINIVIL,ZESTRIL TAKE 1 TABLET DAILY   meloxicam 7.5 MG tablet Commonly known as:  MOBIC Take 1 tablet (7.5 mg total) by mouth daily as needed for pain. What changed:    medication strength  how much to take  when to take this  reasons to take this   metFORMIN 1000 MG tablet Commonly known as:  GLUCOPHAGE TAKE (1) TABLET TWICE A DAY WITH MEALS (BREAKFAST AND SUPPER) FOR DIAB ETES   metoprolol succinate 25 MG 24 hr tablet Commonly known as:  TOPROL-XL Take 1 tablet (25 mg total) by mouth daily. Take with or immediately following a meal. What changed:    how much to take  additional instructions   nitroGLYCERIN 0.4 MG/SPRAY spray Commonly known as:  NITROLINGUAL Place 1 spray under the tongue every 5 (five) minutes as needed.   ondansetron 4 MG disintegrating tablet Commonly known as:  ZOFRAN ODT Take 1 tablet (4 mg total) by mouth every 8 (eight) hours as needed for nausea or vomiting.   Potassium Chloride ER 20 MEQ Tbcr Take 20 mEq by mouth daily.   pravastatin 80 MG tablet Commonly known as:  PRAVACHOL TAKE 1 TABLET ONCE DAILY FOR CHOLESTEROL   PROAIR HFA 108 (90 Base) MCG/ACT inhaler Generic drug:  albuterol 2 PUFFS EVERY 6 HOURS AS NEEDED FOR WHEEZING   sertraline 50 MG tablet Commonly known as:  ZOLOFT TAKE 1 TABLET DAILY       Procedures/Studies: Dg Chest 2 View  Result Date: 06/28/2017 CLINICAL DATA:  84 y/o F; shortness of breath, cough, and mid lower chest pain for 2 days. EXAM: CHEST  2 VIEW COMPARISON:  03/14/2016 chest radiograph FINDINGS: Stable cardiac silhouette given projection and  technique. Aortic atherosclerosis with calcification. Two lead pacemaker. Moderate right and trace left pleural effusions. Reticular opacities of the lungs. No pneumothorax. No acute osseous abnormality is evident. IMPRESSION: Moderate right and trace left pleural effusions, right basilar opacity may represent associated atelectasis or pneumonia. Reticular opacities of lungs, probably interstitial edema. Electronically Signed   By: Mitzi Hansen M.D.   On: 06/28/2017 00:45      Subjective: Pt says she feels much better today.  She is asking about going home.  No CP, No SOB.    Discharge Exam: Vitals:   06/29/17 0438 06/29/17 0951  BP: (!) 157/50   Pulse: 69   Resp: 16   Temp: 98.2 F (36.8 C)   SpO2: 94% 94%   Vitals:   06/28/17 2021 06/28/17 2057 06/29/17 0438 06/29/17 0951  BP:  (!) 147/48 (!) 157/50   Pulse:  60 69   Resp:  15 16   Temp:  (!) 97.5 F (36.4 C) 98.2 F (36.8 C)   TempSrc:  Oral Oral   SpO2: 98% 95% 94% 94%  Weight:   88.4 kg (194 lb 14.2 oz)   Height:        General: Pt is alert, awake, not in acute distress Cardiovascular: normal S1/S2 +, no rubs, no gallops Respiratory: CTA bilaterally, no wheezing, no rhonchi, no crackles.  Abdominal: Soft, NT, ND, bowel sounds + Extremities: no edema, no cyanosis, warm, good perfusion.    The results of significant diagnostics from this hospitalization (including imaging, microbiology, ancillary and  laboratory) are listed below for reference.     Microbiology: Recent Results (from the past 240 hour(s))  MRSA PCR Screening     Status: Abnormal   Collection Time: 06/28/17  4:17 AM  Result Value Ref Range Status   MRSA by PCR POSITIVE (A) NEGATIVE Final    Comment: RESULT CALLED TO, READ BACK BY AND VERIFIED WITH: MCDANIEL M. AT 1221P ON [id number] BY THOMPSON S.        The GeneXpert MRSA Assay (FDA approved for NASAL specimens only), is one component of a comprehensive MRSA colonization surveillance  program. It is not intended to diagnose MRSA infection nor to guide or monitor treatment for MRSA infections.    Labs: BNP (last 3 results) Recent Labs    06/28/17 0111  BNP 1,485.0*   Basic Metabolic Panel: Recent Labs  Lab 06/28/17 0111 06/28/17 0349 06/29/17 0714  NA 136  --  139  K 3.6  --  3.7  CL 100*  --  101  CO2 27  --  29  GLUCOSE 159*  --  108*  BUN 15  --  14  CREATININE 0.77  --  0.74  CALCIUM 8.3*  --  8.4*  MG  --  0.9* 1.4*  PHOS  --  3.1  --    Liver Function Tests: Recent Labs  Lab 06/28/17 0349  AST 32  ALT 19  ALKPHOS 58  BILITOT 0.8  PROT 6.0*  ALBUMIN 3.2*   No results for input(s): LIPASE, AMYLASE in the last 168 hours. No results for input(s): AMMONIA in the last 168 hours. CBC: Recent Labs  Lab 06/28/17 0111  WBC 6.5  NEUTROABS 4.3  HGB 11.5*  HCT 35.4*  MCV 89.6  PLT 194   Cardiac Enzymes: Recent Labs  Lab 06/28/17 0111 06/28/17 0349 06/28/17 0950 06/28/17 1540 06/28/17 2108  TROPONINI 0.04* 0.05* 0.04* 0.04* 0.03*   BNP: Invalid input(s): POCBNP CBG: Recent Labs  Lab 06/28/17 1143 06/28/17 1613 06/28/17 2102 06/29/17 0815 06/29/17 1108  GLUCAP 133* 150* 126* 92 179*   D-Dimer No results for input(s): DDIMER in the last 72 hours. Hgb A1c No results for input(s): HGBA1C in the last 72 hours. Lipid Profile No results for input(s): CHOL, HDL, LDLCALC, TRIG, CHOLHDL, LDLDIRECT in the last 72 hours. Thyroid function studies No results for input(s): TSH, T4TOTAL, T3FREE, THYROIDAB in the last 72 hours.  Invalid input(s): FREET3 Anemia work up No results for input(s): VITAMINB12, FOLATE, FERRITIN, TIBC, IRON, RETICCTPCT in the last 72 hours. Urinalysis    Component Value Date/Time   COLORURINE LT. YELLOW 12/23/2012 1110   APPEARANCEUR Clear 08/26/2015 1540   LABSPEC <=1.005 12/23/2012 1110   PHURINE 6.5 12/23/2012 1110   GLUCOSEU Negative 08/26/2015 1540   GLUCOSEU NEGATIVE 12/23/2012 1110   HGBUR  TRACE-INTACT 12/23/2012 1110   HGBUR large 05/16/2010 0746   BILIRUBINUR Negative 08/26/2015 1540   KETONESUR NEGATIVE 12/23/2012 1110   PROTEINUR Negative 08/26/2015 1540   PROTEINUR NEGATIVE 12/25/2009 2356   UROBILINOGEN negative 07/05/2015 1431   UROBILINOGEN 0.2 12/23/2012 1110   NITRITE Negative 08/26/2015 1540   NITRITE NEGATIVE 12/23/2012 1110   LEUKOCYTESUR Negative 08/26/2015 1540   Sepsis Labs Invalid input(s): PROCALCITONIN,  WBC,  LACTICIDVEN Microbiology Recent Results (from the past 240 hour(s))  MRSA PCR Screening     Status: Abnormal   Collection Time: 06/28/17  4:17 AM  Result Value Ref Range Status   MRSA by PCR POSITIVE (A) NEGATIVE Final    Comment:  RESULT CALLED TO, READ BACK BY AND VERIFIED WITH: MCDANIEL M. AT 1221P ON [id number] BY THOMPSON S.        The GeneXpert MRSA Assay (FDA approved for NASAL specimens only), is one component of a comprehensive MRSA colonization surveillance program. It is not intended to diagnose MRSA infection nor to guide or monitor treatment for MRSA infections.    Time coordinating discharge: 32 mins  SIGNED:  Standley Dakins, MD  [facility] 06/29/2017, 11:30 AM Pager [phone]  If 7PM-7AM, please contact night-coverage [email or web address] Password [email or web address]

## 2017-06-29 NOTE — Discharge Instructions (Signed)
Follow with Primary MD  Hawks, Christy A, FNP  and other consultant's as instructed your Hospitalist MD ° °Please get a complete blood count and chemistry panel checked by your Primary MD at your next visit, and again as instructed by your Primary MD. ° °Get Medicines reviewed and adjusted: °Please take all your medications with you for your next visit with your Primary MD ° °Laboratory/radiological data: °Please request your Primary MD to go over all hospital tests and procedure/radiological results at the follow up, please ask your Primary MD to get all Hospital records sent to his/her office. ° °In some cases, they will be blood work, cultures and biopsy results pending at the time of your discharge. Please request that your primary care M.D. follows up on these results. ° °Also Note the following: °If you experience worsening of your admission symptoms, develop shortness of breath, life threatening emergency, suicidal or homicidal thoughts you must seek medical attention immediately by calling 911 or calling your MD immediately  if symptoms less severe. ° °You must read complete instructions/literature along with all the possible adverse reactions/side effects for all the Medicines you take and that have been prescribed to you. Take any new Medicines after you have completely understood and accpet all the possible adverse reactions/side effects.  ° °Do not drive when taking Pain medications or sleeping medications (Benzodaizepines) ° °Do not take more than prescribed Pain, Sleep and Anxiety Medications. It is not advisable to combine anxiety,sleep and pain medications without talking with your primary care practitioner ° °Special Instructions: If you have smoked or chewed Tobacco  in the last 2 yrs please stop smoking, stop any regular Alcohol  and or any Recreational drug use. ° °Wear Seat belts while driving. ° °Please note: °You were cared for by a hospitalist during your hospital stay. Once you are  discharged, your primary care physician will handle any further medical issues. Please note that NO REFILLS for any discharge medications will be authorized once you are discharged, as it is imperative that you return to your primary care physician (or establish a relationship with a primary care physician if you do not have one) for your post hospital discharge needs so that they can reassess your need for medications and monitor your lab values. ° ° ° ° °

## 2017-07-01 ENCOUNTER — Other Ambulatory Visit: Payer: Self-pay | Admitting: *Deleted

## 2017-07-01 ENCOUNTER — Emergency Department (HOSPITAL_COMMUNITY): Payer: Medicare HMO

## 2017-07-01 ENCOUNTER — Encounter (HOSPITAL_COMMUNITY): Payer: Self-pay | Admitting: *Deleted

## 2017-07-01 ENCOUNTER — Other Ambulatory Visit: Payer: Self-pay

## 2017-07-01 ENCOUNTER — Telehealth: Payer: Self-pay | Admitting: *Deleted

## 2017-07-01 ENCOUNTER — Observation Stay (HOSPITAL_COMMUNITY)
Admission: EM | Admit: 2017-07-01 | Discharge: 2017-07-03 | Disposition: A | Discharge status: Home / Self Care | Payer: Medicare HMO | Attending: Internal Medicine | Admitting: Internal Medicine

## 2017-07-01 DIAGNOSIS — E039 Hypothyroidism, unspecified: Secondary | ICD-10-CM | POA: Diagnosis not present

## 2017-07-01 DIAGNOSIS — E785 Hyperlipidemia, unspecified: Secondary | ICD-10-CM | POA: Diagnosis not present

## 2017-07-01 DIAGNOSIS — I5023 Acute on chronic systolic (congestive) heart failure: Secondary | ICD-10-CM | POA: Diagnosis not present

## 2017-07-01 DIAGNOSIS — J90 Pleural effusion, not elsewhere classified: Secondary | ICD-10-CM | POA: Insufficient documentation

## 2017-07-01 DIAGNOSIS — Z7982 Long term (current) use of aspirin: Secondary | ICD-10-CM | POA: Diagnosis not present

## 2017-07-01 DIAGNOSIS — I110 Hypertensive heart disease with heart failure: Principal | ICD-10-CM | POA: Insufficient documentation

## 2017-07-01 DIAGNOSIS — E1143 Type 2 diabetes mellitus with diabetic autonomic (poly)neuropathy: Secondary | ICD-10-CM | POA: Diagnosis present

## 2017-07-01 DIAGNOSIS — Z7189 Other specified counseling: Secondary | ICD-10-CM | POA: Insufficient documentation

## 2017-07-01 DIAGNOSIS — R4182 Altered mental status, unspecified: Secondary | ICD-10-CM | POA: Diagnosis not present

## 2017-07-01 DIAGNOSIS — I5042 Chronic combined systolic (congestive) and diastolic (congestive) heart failure: Secondary | ICD-10-CM

## 2017-07-01 DIAGNOSIS — Z794 Long term (current) use of insulin: Secondary | ICD-10-CM | POA: Diagnosis not present

## 2017-07-01 DIAGNOSIS — B356 Tinea cruris: Secondary | ICD-10-CM | POA: Diagnosis not present

## 2017-07-01 DIAGNOSIS — E119 Type 2 diabetes mellitus without complications: Secondary | ICD-10-CM | POA: Diagnosis not present

## 2017-07-01 DIAGNOSIS — R1310 Dysphagia, unspecified: Secondary | ICD-10-CM | POA: Diagnosis not present

## 2017-07-01 DIAGNOSIS — R1032 Left lower quadrant pain: Secondary | ICD-10-CM | POA: Diagnosis not present

## 2017-07-01 DIAGNOSIS — Z79899 Other long term (current) drug therapy: Secondary | ICD-10-CM | POA: Diagnosis not present

## 2017-07-01 DIAGNOSIS — R69 Illness, unspecified: Secondary | ICD-10-CM | POA: Diagnosis not present

## 2017-07-01 DIAGNOSIS — J449 Chronic obstructive pulmonary disease, unspecified: Secondary | ICD-10-CM | POA: Diagnosis not present

## 2017-07-01 DIAGNOSIS — Z9889 Other specified postprocedural states: Secondary | ICD-10-CM

## 2017-07-01 DIAGNOSIS — K219 Gastro-esophageal reflux disease without esophagitis: Secondary | ICD-10-CM | POA: Diagnosis not present

## 2017-07-01 DIAGNOSIS — I6782 Cerebral ischemia: Secondary | ICD-10-CM | POA: Insufficient documentation

## 2017-07-01 DIAGNOSIS — R079 Chest pain, unspecified: Secondary | ICD-10-CM | POA: Diagnosis not present

## 2017-07-01 DIAGNOSIS — I509 Heart failure, unspecified: Secondary | ICD-10-CM

## 2017-07-01 DIAGNOSIS — Z950 Presence of cardiac pacemaker: Secondary | ICD-10-CM | POA: Diagnosis not present

## 2017-07-01 DIAGNOSIS — Z96653 Presence of artificial knee joint, bilateral: Secondary | ICD-10-CM | POA: Diagnosis not present

## 2017-07-01 DIAGNOSIS — I5033 Acute on chronic diastolic (congestive) heart failure: Secondary | ICD-10-CM | POA: Diagnosis not present

## 2017-07-01 DIAGNOSIS — I2510 Atherosclerotic heart disease of native coronary artery without angina pectoris: Secondary | ICD-10-CM | POA: Insufficient documentation

## 2017-07-01 DIAGNOSIS — R05 Cough: Secondary | ICD-10-CM | POA: Diagnosis not present

## 2017-07-01 DIAGNOSIS — R0602 Shortness of breath: Secondary | ICD-10-CM | POA: Diagnosis not present

## 2017-07-01 DIAGNOSIS — R531 Weakness: Secondary | ICD-10-CM | POA: Diagnosis not present

## 2017-07-01 DIAGNOSIS — I5043 Acute on chronic combined systolic (congestive) and diastolic (congestive) heart failure: Secondary | ICD-10-CM | POA: Diagnosis not present

## 2017-07-01 DIAGNOSIS — Z515 Encounter for palliative care: Secondary | ICD-10-CM

## 2017-07-01 LAB — CBC WITH DIFFERENTIAL/PLATELET
Basophils Absolute: 0 10*3/uL (ref 0.0–0.1)
Basophils Relative: 0 %
EOS ABS: 0.1 10*3/uL (ref 0.0–0.7)
Eosinophils Relative: 2 %
HEMATOCRIT: 39.4 % (ref 36.0–46.0)
HEMOGLOBIN: 12.9 g/dL (ref 12.0–15.0)
LYMPHS ABS: 2.4 10*3/uL (ref 0.7–4.0)
LYMPHS PCT: 29 %
MCH: 29.3 pg (ref 26.0–34.0)
MCHC: 32.7 g/dL (ref 30.0–36.0)
MCV: 89.3 fL (ref 78.0–100.0)
MONOS PCT: 7 %
Monocytes Absolute: 0.6 10*3/uL (ref 0.1–1.0)
NEUTROS PCT: 62 %
Neutro Abs: 5.1 10*3/uL (ref 1.7–7.7)
Platelets: 126 10*3/uL — ABNORMAL LOW (ref 150–400)
RBC: 4.41 MIL/uL (ref 3.87–5.11)
RDW: 13.2 % (ref 11.5–15.5)
WBC: 8.2 10*3/uL (ref 4.0–10.5)

## 2017-07-01 LAB — COMPREHENSIVE METABOLIC PANEL
ALT: 20 U/L (ref 14–54)
ANION GAP: 15 (ref 5–15)
AST: 35 U/L (ref 15–41)
Albumin: 4.2 g/dL (ref 3.5–5.0)
Alkaline Phosphatase: 69 U/L (ref 38–126)
BILIRUBIN TOTAL: 0.9 mg/dL (ref 0.3–1.2)
BUN: 14 mg/dL (ref 6–20)
CO2: 21 mmol/L — AB (ref 22–32)
CREATININE: 0.76 mg/dL (ref 0.44–1.00)
Calcium: 9.7 mg/dL (ref 8.9–10.3)
Chloride: 98 mmol/L — ABNORMAL LOW (ref 101–111)
GFR calc Af Amer: >60 mL/min (ref >60)
GFR calc non Af Amer: >60 mL/min (ref >60)
GLUCOSE: 126 mg/dL — AB (ref 65–99)
Potassium: 4.6 mmol/L (ref 3.5–5.1)
Sodium: 134 mmol/L — ABNORMAL LOW (ref 135–145)
Total Protein: 7.7 g/dL (ref 6.5–8.1)

## 2017-07-01 LAB — URINALYSIS, ROUTINE W REFLEX MICROSCOPIC
Bilirubin Urine: NEGATIVE
GLUCOSE, UA: NEGATIVE mg/dL
Hgb urine dipstick: NEGATIVE
Ketones, ur: NEGATIVE mg/dL
LEUKOCYTES UA: NEGATIVE
Nitrite: NEGATIVE
PH: 7 (ref 5.0–8.0)
Protein, ur: NEGATIVE mg/dL
Specific Gravity, Urine: 1.004 — ABNORMAL LOW (ref 1.005–1.030)

## 2017-07-01 LAB — BRAIN NATRIURETIC PEPTIDE: B Natriuretic Peptide: 1641 pg/mL — ABNORMAL HIGH (ref 0.0–100.0)

## 2017-07-01 MED ORDER — CLOTRIMAZOLE 1 % EX CREA: TOPICAL_CREAM | CUTANEOUS | 0 refills | Status: DC

## 2017-07-01 MED ORDER — FUROSEMIDE 40 MG PO TABS: 40.0000 mg | ORAL_TABLET | Freq: Every day | ORAL | 2 refills | Status: DC

## 2017-07-01 NOTE — ED Provider Notes (Signed)
[facility]GENCY [facility]PARTMENT Provider Note   CSN: [id number] Arrival date & time: 07/01/17  2029     History   Chief Complaint Chief Complaint  Patient presents with  . Abdominal Pain   Complaint chest pain HPI [name] [name] is a 84 y.o. female.  Patient complains of chest pain onset this afternoon accompanied by cough productive of yellowish clear sputum.  She denies any fever.  She admits to shortness of breath earlier today however denies shortness of breath now reports "burning between my legs ", though denies any dysuria.  She also reports 4 episodes of diarrhea earlier today.  No treatment prior to coming here brought by EMS.  She denies abdominal pain.  States she is presently hungry.  She also reports leg swelling, bilaterally since today.  He was discharged from here 2 days ago with diagnosis acute on chronic congestive heart failure  HPI  Past Medical History:  Diagnosis Date  . ANXIETY   . Arteriosclerotic cardiovascular disease (ASCVD)    Cath in 9/99-70% mid LAD with diffuse distal disease, 70% T1, 60% mid circumflex, 50% mid RCA, normal ejection fraction; negative stress nuclear in 07/2008  . Carpal tunnel syndrome   . Cerebrovascular disease    carotid bruits; no focal disease in 1999, 2002 and 2006  . Closed left fibular fracture 11/30/11  . COPD   . Diabetes mellitus    Insulin treatment  . Diastolic CHF, chronic (HCC)    Normal EF  . Gastroesophageal reflux disease    With hiatal hernia; irritable bowel syndrome; H. pylori-treated; Colonoscopy 2008: non-specific colitis, IH, Diverticulosis, Rectal ulcer secondary to ASA  . Hyperlipidemia   . Hypertension   . Hypothyroidism   . OSTEOARTHRITIS, KNEES, BILATERAL    Bilateral TKA  . Pacemaker   . Peripheral neuropathy   . Sinoatrial node dysfunction (HCC) 2003   Medtronic dual-chamber device  . Sleep apnea   . VITAMIN B12 DEFICIENCY     Patient Active Problem List   Diagnosis Date Noted  . Pressure  injury of skin 06/29/2017  . Acute CHF (congestive heart failure) (HCC) 06/28/2017  . Type II diabetes mellitus (HCC) 06/28/2017  . Elevated troponin 06/28/2017  . Normocytic anemia 06/28/2017  . Sleep apnea 06/28/2017  . Acute on chronic diastolic congestive heart failure (HCC) 06/28/2017  . Tinea cruris 06/28/2017  . Acute on chronic diastolic (congestive) heart failure (HCC) 06/28/2017  . Hyperlipidemia associated with type 2 diabetes mellitus (HCC) 05/14/2017  . Chronic back pain 01/28/2017  . PVD (peripheral vascular disease) (HCC) 01/27/2016  . Skin moniliasis 05/31/2014  . OAB (overactive bladder) 12/23/2012  . Insomnia 04/06/2011  . Sinoatrial node dysfunction (HCC)   . Gastroesophageal reflux disease   . Type II diabetes mellitus with peripheral autonomic neuropathy (HCC)   . GAD (generalized anxiety disorder) 12/30/2009  . Diabetic neuropathy (HCC) 12/26/2009  . Mixed hyperlipidemia 12/07/2009  . Vitamin B 12 deficiency 10/11/2009  . Morbid obesity (HCC) 09/16/2009  . Arteriosclerotic cardiovascular disease (ASCVD) 09/15/2009  . CEREBROVASCULAR DISEASE 09/15/2009  . Hypertension associated with diabetes (HCC) 08/29/2009  . Osteoarthrosis involving lower leg 08/29/2009  . COLONIC POLYPS, ADENOMATOUS, HX OF 02/22/2009  . Hypothyroid 02/17/2009  . Chronic obstructive pulmonary disease (HCC) 02/17/2009  . Chronic diastolic heart failure (HCC) 12/15/2008  . PPM-Medtronic 12/15/2008    Past Surgical History:  Procedure Laterality Date  . ABDOMINAL HYSTERECTOMY    . BLADDER REPAIR    . CARDIAC PACEMAKER PLACEMENT  2003  Medtronic, dual-chamber  . CARPAL TUNNEL RELEASE    . CHOLECYSTECTOMY    . COLONOSCOPY  2008  . HEMORRHOID SURGERY    . NASAL FRACTURE SURGERY    . THYROIDECTOMY  1980   Goiter  . TOTAL KNEE ARTHROPLASTY     Bilateral    OB History    No data available       Home Medications    Prior to Admission medications   Medication Sig Start Date  End Date Taking? Authorizing Provider  ALPRAZolam Prudy Feeler) 0.5 MG tablet Take 1 tablet (0.5 mg total) by mouth at bedtime as needed for anxiety. 05/14/17   Jannifer Rodney A, FNP  aspirin EC 81 MG tablet Take 81 mg by mouth daily. Reported on 12/14/2015    [provider]  budesonide-formoterol (SYMBICORT) 80-4.5 MCG/ACT inhaler Inhale 2 puffs into the lungs 2 (two) times daily. 03/06/16   Junie Spencer, FNP  clotrimazole (LOTRIMIN) 1 % cream Apply to affected right groin area twice daily 07/01/17   Jannifer Rodney A, FNP  cyclobenzaprine (FLEXERIL) 10 MG tablet Take 1 tablet by mouth 3 (three) times daily as needed f or muscle spasms. 02/07/17   Jannifer Rodney A, FNP  fluconazole (DIFLUCAN) 100 MG tablet Take 1 tablet (100 mg total) by mouth daily for 4 days. 06/30/17 07/04/17  Johnson, Clanford L, MD  furosemide (LASIX) 40 MG tablet Take 1 tablet (40 mg total) by mouth daily. Reported on 12/14/2015 07/01/17   Junie Spencer, FNP  gabapentin (NEURONTIN) 300 MG capsule TAKE (1) CAPSULE THREE TIMES DAILY. 06/13/17   Jannifer Rodney A, FNP  glimepiride (AMARYL) 4 MG tablet TAKE (1) TABLET DAILY BE- FORE BREAKFAST. 05/29/17   Junie Spencer, FNP  HYDROcodone-acetaminophen (NORCO/VICODIN) 5-325 MG tablet Take 1 tablet by mouth every 6 (six) hours as needed for moderate pain. 05/14/17   Jannifer Rodney A, FNP  insulin NPH Human (HUMULIN N) 100 UNIT/ML injection Inject 0.08 mLs (8 Units total) into the skin 2 (two) times daily before a meal. 06/29/17   Johnson, Clanford L, MD  levothyroxine (SYNTHROID, LEVOTHROID) 200 MCG tablet TAKE (1) TABLET DAILY BE- FORE BREAKFAST. 03/29/17   Junie Spencer, FNP  lisinopril (PRINIVIL,ZESTRIL) 10 MG tablet TAKE 1 TABLET DAILY 11/26/16   Jannifer Rodney A, FNP  meloxicam (MOBIC) 7.5 MG tablet Take 1 tablet (7.5 mg total) by mouth daily as needed for pain. 06/29/17   Johnson, Clanford L, MD  metFORMIN (GLUCOPHAGE) 1000 MG tablet TAKE (1) TABLET TWICE A DAY WITH MEALS  (BREAKFAST AND SUPPER) FOR DIAB ETES 06/17/17   Hawks, Neysa Bonito A, FNP  metoprolol succinate (TOPROL-XL) 25 MG 24 hr tablet Take 1 tablet (25 mg total) by mouth daily. Take with or immediately following a meal. Patient taking differently: Take 12.5 mg by mouth daily. Take with or immediately following a meal. 06/18/17   Hawks, Neysa Bonito A, FNP  nitroGLYCERIN (NITROLINGUAL) 0.4 MG/SPRAY spray Place 1 spray under the tongue every 5 (five) minutes as needed. 10/28/15   Mechele Claude, MD  ondansetron (ZOFRAN ODT) 4 MG disintegrating tablet Take 1 tablet (4 mg total) by mouth every 8 (eight) hours as needed for nausea or vomiting. 03/01/17   Jannifer Rodney A, FNP  potassium chloride 20 MEQ TBCR Take 20 mEq by mouth daily. 06/29/17 07/29/17  Cleora Fleet, MD  pravastatin (PRAVACHOL) 80 MG tablet TAKE 1 TABLET ONCE DAILY FOR CHOLESTEROL 05/29/17   Junie Spencer, FNP  PROAIR HFA 108 [id number] Base) MCG/ACT inhaler  2 PUFFS EVERY 6 HOURS AS NEEDED FOR WHEEZING 01/12/16   Jannifer Rodney A, FNP  sertraline (ZOLOFT) 50 MG tablet TAKE 1 TABLET DAILY 05/29/17   Junie Spencer, FNP    Family History Family History  Problem Relation Age of Onset  . COPD Father   . Heart attack Father 71  . Heart failure Mother   . Cancer Sister        colon  . Cancer Brother        lung  . Cancer Sister        bladder  . Cancer Brother        lung  . Early death Son   . Diabetes Son   . Heart disease Son   . Cancer Son        prostate  . Cancer Sister        kidney cancer  . Diabetes Sister   . Cancer Daughter     Social History Social History   Tobacco Use  . Smoking status: Never Smoker  . Smokeless tobacco: Never Used  Substance Use Topics  . Alcohol use: No    Alcohol/week: 0.0 oz  . Drug use: No     Allergies   Penicillins   Review of Systems Review of Systems  Constitutional: Negative.   HENT: Negative.   Respiratory: Positive for cough and shortness of breath.        Short of breath earlier  today, none  Cardiovascular: Positive for chest pain and leg swelling.  Gastrointestinal: Positive for diarrhea. Negative for abdominal pain.  Musculoskeletal: Negative.   Skin: Negative.   Neurological: Negative.   Psychiatric/Behavioral: Negative.   All other systems reviewed and are negative.    Physical Exam Updated Vital Signs BP (!) 156/74 (BP Location: Right Arm)   Pulse 67   Temp 97.6 F (36.4 C) (Oral)   Resp 18   Ht 5\' 4"  (1.626 m)   Wt 87.5 kg (193 lb)   SpO2 94%   BMI 33.13 kg/m   Physical Exam  Constitutional: She is oriented to person, place, and time. She appears well-developed and well-nourished.  HENT:  Head: Normocephalic and atraumatic.  Eyes: Conjunctivae are normal. Pupils are equal, round, and reactive to light.  Neck: Neck supple. No tracheal deviation present. No thyromegaly present.  Cardiovascular: Normal rate and regular rhythm.  No murmur heard. Pulmonary/Chest: Effort normal and breath sounds normal.  Abdominal: Soft. Bowel sounds are normal. She exhibits no distension. There is no tenderness.  Musculoskeletal: Normal range of motion. She exhibits edema. She exhibits no tenderness.  Trace pretibial pitting edema bilaterally  Neurological: She is alert and oriented to person, place, and time. Coordination normal.  Oriented to name date and hospital  Skin: Skin is warm and dry. No rash noted.  Psychiatric: She has a normal mood and affect.  Nursing note and vitals reviewed.    ED Treatments / Results  Labs (all labs ordered are listed, but only abnormal results are displayed) Labs Reviewed  COMPREHENSIVE METABOLIC PANEL  CBC WITH DIFFERENTIAL/PLATELET  URINALYSIS, ROUTINE W REFLEX MICROSCOPIC  BRAIN NATRIURETIC PEPTIDE    EKG  EKG Interpretation  Date/Time:  Monday July 01 2017 21:33:36 EST Ventricular Rate:  68 PR Interval:    QRS Duration: 190 QT Interval:  548 QTC Calculation: 583 R Axis:   -66 Text Interpretation:  A-V  dual-paced rhythm with some inhibition No further analysis attempted due to paced rhythm Baseline wander in lead(s) V2  No significant change since last tracing Confirmed by Doug Sou [phone]) on 07/01/2017 9:42:14 PM       Radiology No results found.  Procedures Procedures (including critical care time)  Medications Ordered in ED Medications - No data to display  Results for orders placed or performed during the hospital encounter of 07/01/17  Comprehensive metabolic panel  Result Value Ref Range   Sodium 134 (L) 135 - 145 mmol/L   Potassium 4.6 3.5 - 5.1 mmol/L   Chloride 98 (L) 101 - 111 mmol/L   CO2 21 (L) 22 - 32 mmol/L   Glucose, Bld 126 (H) 65 - 99 mg/dL   BUN 14 6 - 20 mg/dL   Creatinine, Ser 1.91 0.44 - 1.00 mg/dL   Calcium 9.7 8.9 - 47.8 mg/dL   Total Protein 7.7 6.5 - 8.1 g/dL   Albumin 4.2 3.5 - 5.0 g/dL   AST 35 15 - 41 U/L   ALT 20 14 - 54 U/L   Alkaline Phosphatase 69 38 - 126 U/L   Total Bilirubin 0.9 0.3 - 1.2 mg/dL   GFR calc non Af Amer >60 >60 mL/min   GFR calc Af Amer >60 >60 mL/min   Anion gap 15 5 - 15  Urinalysis, Routine w reflex microscopic  Result Value Ref Range   Color, Urine COLORLESS (A) YELLOW   APPearance CLEAR CLEAR   Specific Gravity, Urine 1.004 (L) 1.005 - 1.030   pH 7.0 5.0 - 8.0   Glucose, UA NEGATIVE NEGATIVE mg/dL   Hgb urine dipstick NEGATIVE NEGATIVE   Bilirubin Urine NEGATIVE NEGATIVE   Ketones, ur NEGATIVE NEGATIVE mg/dL   Protein, ur NEGATIVE NEGATIVE mg/dL   Nitrite NEGATIVE NEGATIVE   Leukocytes, UA NEGATIVE NEGATIVE  Brain natriuretic peptide  Result Value Ref Range   B Natriuretic Peptide 1,641.0 (H) 0.0 - 100.0 pg/mL  CBC with Differential/Platelet  Result Value Ref Range   WBC 8.2 4.0 - 10.5 K/uL   RBC 4.41 3.87 - 5.11 MIL/uL   Hemoglobin 12.9 12.0 - 15.0 g/dL   HCT 29.5 62.1 - 30.8 %   MCV 89.3 78.0 - 100.0 fL   MCH 29.3 26.0 - 34.0 pg   MCHC 32.7 30.0 - 36.0 g/dL   RDW 65.7 84.6 - 96.2 %   Platelets  126 (L) 150 - 400 K/uL   Neutrophils Relative % 62 %   Neutro Abs 5.1 1.7 - 7.7 K/uL   Lymphocytes Relative 29 %   Lymphs Abs 2.4 0.7 - 4.0 K/uL   Monocytes Relative 7 %   Monocytes Absolute 0.6 0.1 - 1.0 K/uL   Eosinophils Relative 2 %   Eosinophils Absolute 0.1 0.0 - 0.7 K/uL   Basophils Relative 0 %   Basophils Absolute 0.0 0.0 - 0.1 K/uL   Dg Chest 2 View  Result Date: 07/01/2017 CLINICAL DATA:  Dyspnea and cough today. EXAM: CHEST  2 VIEW COMPARISON:  06/27/2017 FINDINGS: Heart and mediastinal contours are stable with aortic atherosclerosis. No aneurysm. Moderate right and trace left effusions are stable. Right atrial and right ventricular leads are noted with left-sided pacemaker apparatus projecting over the left upper lobe. Mild interstitial edema is stable. No acute osseous abnormality. Osteoarthritis of the [facility] and glenohumeral joints bilaterally. IMPRESSION: 1. Chronic stable mild interstitial edema with moderate right and trace left pleural effusions. 2. Aortic atherosclerosis. Electronically Signed   By: Tollie Eth M.D.   On: 07/01/2017 22:48   Dg Chest 2 View  Result Date: 06/28/2017 CLINICAL  DATA:  84 y/o F; shortness of breath, cough, and mid lower chest pain for 2 days. EXAM: CHEST  2 VIEW COMPARISON:  03/14/2016 chest radiograph FINDINGS: Stable cardiac silhouette given projection and technique. Aortic atherosclerosis with calcification. Two lead pacemaker. Moderate right and trace left pleural effusions. Reticular opacities of the lungs. No pneumothorax. No acute osseous abnormality is evident. IMPRESSION: Moderate right and trace left pleural effusions, right basilar opacity may represent associated atelectasis or pneumonia. Reticular opacities of lungs, probably interstitial edema. Electronically Signed   By: Mitzi Hansen M.D.   On: 06/28/2017 00:45  Chest x-ray viewed by me Initial Impression / Assessment and Plan / ED Course  I have reviewed the triage vital  signs and the nursing notes.  Pertinent labs & imaging results that were available during my care of the patient were reviewed by me and considered in my medical decision making (see chart for details).     BNP increased over 3 days ago.  X-ray consistent with congestive heart failure.  Intravenous Lasix and topical nitrates ordered Hospitalist Dr. Melynda Ripple consulted stay.  Palliative care consult ordered by me to discuss goals of care Final Clinical Impressions(s) / ED Diagnoses  Diagnosis acute on chronic congestive heart failure Final diagnoses:  None    ED Discharge Orders    None       Doug Sou, MD 07/02/17 0025

## 2017-07-01 NOTE — ED Notes (Signed)
After triage complete and EMS left, pt c/o burning and swelling between the tops of her legs and her vagina, pt states she was given a prescription that she filled today

## 2017-07-01 NOTE — ED Triage Notes (Signed)
Pt brought in by [facility] for c/o left lower abdominal pain; pt was just discharged from unit 300; pt is confused and ems was called out initially for choking; pt cbg 153

## 2017-07-01 NOTE — Telephone Encounter (Signed)
Pt was admitted to [facility] from 1/17 - 1/19 Home Health needing to know if we want a diuretic protocol on patient Please advise and call [facility] [name] [name]

## 2017-07-02 ENCOUNTER — Encounter (HOSPITAL_COMMUNITY): Payer: Self-pay

## 2017-07-02 ENCOUNTER — Observation Stay (HOSPITAL_COMMUNITY): Payer: Medicare HMO

## 2017-07-02 ENCOUNTER — Other Ambulatory Visit: Payer: Self-pay

## 2017-07-02 DIAGNOSIS — I2510 Atherosclerotic heart disease of native coronary artery without angina pectoris: Secondary | ICD-10-CM | POA: Diagnosis not present

## 2017-07-02 DIAGNOSIS — Z7189 Other specified counseling: Secondary | ICD-10-CM

## 2017-07-02 DIAGNOSIS — I5042 Chronic combined systolic (congestive) and diastolic (congestive) heart failure: Secondary | ICD-10-CM

## 2017-07-02 DIAGNOSIS — Z515 Encounter for palliative care: Secondary | ICD-10-CM

## 2017-07-02 DIAGNOSIS — E119 Type 2 diabetes mellitus without complications: Secondary | ICD-10-CM | POA: Diagnosis not present

## 2017-07-02 DIAGNOSIS — J90 Pleural effusion, not elsewhere classified: Secondary | ICD-10-CM | POA: Diagnosis not present

## 2017-07-02 DIAGNOSIS — E1143 Type 2 diabetes mellitus with diabetic autonomic (poly)neuropathy: Secondary | ICD-10-CM

## 2017-07-02 DIAGNOSIS — R0600 Dyspnea, unspecified: Secondary | ICD-10-CM | POA: Diagnosis not present

## 2017-07-02 DIAGNOSIS — I509 Heart failure, unspecified: Secondary | ICD-10-CM | POA: Diagnosis not present

## 2017-07-02 DIAGNOSIS — R4182 Altered mental status, unspecified: Secondary | ICD-10-CM | POA: Diagnosis not present

## 2017-07-02 DIAGNOSIS — I5043 Acute on chronic combined systolic (congestive) and diastolic (congestive) heart failure: Secondary | ICD-10-CM | POA: Diagnosis not present

## 2017-07-02 LAB — BODY FLUID CELL COUNT WITH DIFFERENTIAL
Eos, Fluid: 0 %
LYMPHS FL: 74 %
Monocyte-Macrophage-Serous Fluid: 19 % — ABNORMAL LOW (ref 50–90)
NEUTROPHIL FLUID: 7 % (ref 0–25)
Total Nucleated Cell Count, Fluid: 830 cu mm (ref 0–1000)

## 2017-07-02 LAB — LACTATE DEHYDROGENASE: LDH: 201 U/L — ABNORMAL HIGH (ref 98–192)

## 2017-07-02 LAB — GLUCOSE, CAPILLARY
GLUCOSE-CAPILLARY: 121 mg/dL — AB (ref 65–99)
GLUCOSE-CAPILLARY: 154 mg/dL — AB (ref 65–99)
GLUCOSE-CAPILLARY: 110 mg/dL — AB (ref 65–99)
GLUCOSE-CAPILLARY: 130 mg/dL — AB (ref 65–99)
Glucose-Capillary: 123 mg/dL — ABNORMAL HIGH (ref 65–99)

## 2017-07-02 LAB — GRAM STAIN

## 2017-07-02 LAB — PROTEIN, PLEURAL OR PERITONEAL FLUID

## 2017-07-02 LAB — LACTATE DEHYDROGENASE, PLEURAL OR PERITONEAL FLUID: LD FL: 67 U/L — AB (ref 3–23)

## 2017-07-02 LAB — HEMOGLOBIN A1C
HEMOGLOBIN A1C: 6.2 % — AB (ref 4.8–5.6)
Mean Plasma Glucose: 131.24 mg/dL

## 2017-07-02 MED ORDER — INSULIN ASPART 100 UNIT/ML ~~LOC~~ SOLN
0.0000 [IU] | Freq: Three times a day (TID) | SUBCUTANEOUS | Status: DC
  Administered 2017-07-02: 2 [IU] via SUBCUTANEOUS
  Administered 2017-07-02: 3 [IU] via SUBCUTANEOUS

## 2017-07-02 MED ORDER — ACETAMINOPHEN 325 MG PO TABS
650.0000 mg | ORAL_TABLET | ORAL | Status: DC | PRN
  Administered 2017-07-02 (×2): 650 mg via ORAL
  Filled 2017-07-02 (×2): qty 2

## 2017-07-02 MED ORDER — SODIUM CHLORIDE 0.9 % IV SOLN: 250.0000 mL | INTRAVENOUS | Status: DC | PRN

## 2017-07-02 MED ORDER — SODIUM CHLORIDE 0.9% FLUSH
3.0000 mL | Freq: Two times a day (BID) | INTRAVENOUS | Status: DC
  Administered 2017-07-02 – 2017-07-03 (×4): 3 mL via INTRAVENOUS

## 2017-07-02 MED ORDER — GABAPENTIN 300 MG PO CAPS
300.0000 mg | ORAL_CAPSULE | Freq: Three times a day (TID) | ORAL | Status: DC
  Administered 2017-07-02 – 2017-07-03 (×4): 300 mg via ORAL
  Filled 2017-07-02 (×4): qty 1

## 2017-07-02 MED ORDER — MOMETASONE FURO-FORMOTEROL FUM 100-5 MCG/ACT IN AERO
2.0000 | INHALATION_SPRAY | Freq: Two times a day (BID) | RESPIRATORY_TRACT | Status: DC
  Administered 2017-07-02 – 2017-07-03 (×3): 2 via RESPIRATORY_TRACT
  Filled 2017-07-02: qty 8.8

## 2017-07-02 MED ORDER — PRAVASTATIN SODIUM 40 MG PO TABS
80.0000 mg | ORAL_TABLET | Freq: Every day | ORAL | Status: DC
  Administered 2017-07-02: 80 mg via ORAL
  Filled 2017-07-02: qty 2
  Filled 2017-07-02 (×2): qty 1

## 2017-07-02 MED ORDER — FUROSEMIDE 10 MG/ML IJ SOLN
40.0000 mg | Freq: Once | INTRAMUSCULAR | Status: AC
  Administered 2017-07-02: 40 mg via INTRAVENOUS
  Filled 2017-07-02: qty 4

## 2017-07-02 MED ORDER — ALPRAZOLAM 0.5 MG PO TABS: 0.5000 mg | ORAL_TABLET | Freq: Every evening | ORAL | Status: DC | PRN

## 2017-07-02 MED ORDER — INSULIN ASPART 100 UNIT/ML ~~LOC~~ SOLN: 0.0000 [IU] | Freq: Every day | SUBCUTANEOUS | Status: DC

## 2017-07-02 MED ORDER — FUROSEMIDE 10 MG/ML IJ SOLN
80.0000 mg | Freq: Two times a day (BID) | INTRAMUSCULAR | Status: DC
  Administered 2017-07-02: 80 mg via INTRAVENOUS
  Filled 2017-07-02: qty 8

## 2017-07-02 MED ORDER — INSULIN ASPART 100 UNIT/ML ~~LOC~~ SOLN
0.0000 [IU] | Freq: Three times a day (TID) | SUBCUTANEOUS | Status: DC
  Administered 2017-07-02 – 2017-07-03 (×2): 2 [IU] via SUBCUTANEOUS
  Administered 2017-07-03: 3 [IU] via SUBCUTANEOUS

## 2017-07-02 MED ORDER — NITROGLYCERIN 2 % TD OINT
1.0000 [in_us] | TOPICAL_OINTMENT | Freq: Once | TRANSDERMAL | Status: AC
  Administered 2017-07-02: 1 [in_us] via TOPICAL
  Filled 2017-07-02: qty 1

## 2017-07-02 MED ORDER — LISINOPRIL 10 MG PO TABS
10.0000 mg | ORAL_TABLET | Freq: Every day | ORAL | Status: DC
  Administered 2017-07-02 – 2017-07-03 (×2): 10 mg via ORAL
  Filled 2017-07-02 (×2): qty 1

## 2017-07-02 MED ORDER — POTASSIUM CHLORIDE 20 MEQ PO PACK
20.0000 meq | PACK | Freq: Every day | ORAL | Status: DC
  Administered 2017-07-02 – 2017-07-03 (×2): 20 meq via ORAL
  Filled 2017-07-02 (×2): qty 1

## 2017-07-02 MED ORDER — SODIUM CHLORIDE 0.9% FLUSH: 3.0000 mL | INTRAVENOUS | Status: DC | PRN

## 2017-07-02 MED ORDER — LEVOTHYROXINE SODIUM 50 MCG PO TABS
200.0000 ug | ORAL_TABLET | Freq: Every day | ORAL | Status: DC
  Administered 2017-07-02 – 2017-07-03 (×2): 200 ug via ORAL
  Filled 2017-07-02 (×2): qty 4

## 2017-07-02 MED ORDER — ALBUTEROL SULFATE (2.5 MG/3ML) 0.083% IN NEBU: 2.5000 mg | INHALATION_SOLUTION | RESPIRATORY_TRACT | Status: DC | PRN

## 2017-07-02 MED ORDER — METOPROLOL SUCCINATE 12.5 MG HALF TABLET
12.5000 mg | ORAL_TABLET | Freq: Every day | ORAL | Status: DC
  Administered 2017-07-02 – 2017-07-03 (×2): 12.5 mg via ORAL
  Filled 2017-07-02 (×4): qty 1

## 2017-07-02 MED ORDER — SERTRALINE HCL 50 MG PO TABS
50.0000 mg | ORAL_TABLET | Freq: Every day | ORAL | Status: DC
  Administered 2017-07-02 – 2017-07-03 (×2): 50 mg via ORAL
  Filled 2017-07-02 (×2): qty 1

## 2017-07-02 MED ORDER — FUROSEMIDE 40 MG PO TABS
40.0000 mg | ORAL_TABLET | Freq: Two times a day (BID) | ORAL | Status: DC
  Administered 2017-07-02 – 2017-07-03 (×2): 40 mg via ORAL
  Filled 2017-07-02 (×2): qty 1

## 2017-07-02 MED ORDER — ONDANSETRON HCL 4 MG/2ML IJ SOLN: 4.0000 mg | Freq: Four times a day (QID) | INTRAMUSCULAR | Status: DC | PRN

## 2017-07-02 MED ORDER — ASPIRIN EC 81 MG PO TBEC
81.0000 mg | DELAYED_RELEASE_TABLET | Freq: Every day | ORAL | Status: DC
  Administered 2017-07-02 – 2017-07-03 (×2): 81 mg via ORAL
  Filled 2017-07-02 (×2): qty 1

## 2017-07-02 MED ORDER — FLUCONAZOLE 100 MG PO TABS
100.0000 mg | ORAL_TABLET | Freq: Every day | ORAL | Status: DC
  Administered 2017-07-02: 100 mg via ORAL
  Filled 2017-07-02: qty 1

## 2017-07-02 MED ORDER — ENOXAPARIN SODIUM 40 MG/0.4ML ~~LOC~~ SOLN
40.0000 mg | SUBCUTANEOUS | Status: DC
  Administered 2017-07-02: 40 mg via SUBCUTANEOUS
  Filled 2017-07-02: qty 0.4

## 2017-07-02 NOTE — ED Notes (Signed)
Pt to bedside commode with minimal assistance, prefers to use over [employer]

## 2017-07-02 NOTE — ED Notes (Signed)
Purewick placed on pt. 

## 2017-07-02 NOTE — Progress Notes (Signed)
Thoracentesis complete no signs of distress.  

## 2017-07-02 NOTE — Consult Note (Signed)
Consultation Note Date: 07/02/2017   Patient Name: [name] [name]  DOB: [birth year]  MRN: [id number]  Age / Sex: 84 y.o., female  PCP: Junie Spencer, FNP Referring Physician: Catarina Hartshorn, MD  Reason for Consultation: Establishing goals of care and Psychosocial/spiritual support  HPI/Patient Profile: 84 y.o. female  with past medical history of systolic and diastolic heart failure, COPD, diabetes, high blood pressure and cholesterol, coronary artery disease, sinus node dysfunction with PPM, low thyroid, recent hospitalization for CHF admitted on 07/01/2017 with acute on chronic systolic and diastolic heart failure.   Clinical Assessment and Goals of Care: Mrs. Pousson is sitting up at the edge of the bed. She is getting her meal tray ready.  There is no family at bedside at this time.   We talk briefly about making a good plan for her. She states that she would be happy when her home got back together. I ask if she's had trouble at the house, She shares that her husband is "over there". I ask if she means at [facility], but she says no the other one ([place]). She shares that someone is to come and see them 3 days a week, her son-in-law is to come one day a week, and a family friend is to come another day. Mrs. Pagel seems a little confused, but I am unable to ask her orientation questions as her phone rings, and she decides that she would rather talk on the telephone at this time. I share that I will come back later, preferably when family is available. Plan of care discussed with nursing staff.  Healthcare power of attorney NEXT OF KIN - unsure of HC POA at this time, unable to ask Mrs. Warne today.   SUMMARY OF RECOMMENDATIONS   at this point, full scope treatment. Continued code status discussions  Code Status/Advance Care Planning:  Full code - unable to discuss code status today. No family present, Mrs.  Iacovelli decided she would rather speak on the telephone.  Symptom Management:   per hospitalist, no additional needs at this time.  Palliative Prophylaxis:   No additional needs at this time  Additional Recommendations (Limitations, Scope, Preferences):  Full Scope Treatment  Psycho-social/Spiritual:   Desire for further Chaplaincy support:no  Additional Recommendations: Caregiving  Support/Resources and Education on Hospice  Prognosis:   < 6 months, would not be surprising based on apparent frailty, 2 hospitalizations and 6 months, severity of heart failure and buildup of pleural fluid.  Discharge Planning: To be determined, likely home with home health      Primary Diagnoses: Present on Admission: . Arteriosclerotic cardiovascular disease (ASCVD) . Type II diabetes mellitus with peripheral autonomic neuropathy (HCC)   I have reviewed the medical record, interviewed the patient and family, and examined the patient. The following aspects are pertinent.  Past Medical History:  Diagnosis Date  . ANXIETY   . Arteriosclerotic cardiovascular disease (ASCVD)    Cath in 9/99-70% mid LAD with diffuse distal disease, 70% T1, 60% mid circumflex, 50% mid RCA,  normal ejection fraction; negative stress nuclear in 07/2008  . Carpal tunnel syndrome   . Cerebrovascular disease    carotid bruits; no focal disease in 1999, 2002 and 2006  . Closed left fibular fracture 11/30/11  . COPD   . Diabetes mellitus    Insulin treatment  . Diastolic CHF, chronic (HCC)    Normal EF  . Gastroesophageal reflux disease    With hiatal hernia; irritable bowel syndrome; H. pylori-treated; Colonoscopy 2008: non-specific colitis, IH, Diverticulosis, Rectal ulcer secondary to ASA  . Hyperlipidemia   . Hypertension   . Hypothyroidism   . OSTEOARTHRITIS, KNEES, BILATERAL    Bilateral TKA  . Pacemaker   . Peripheral neuropathy   . Sinoatrial node dysfunction (HCC) 2003   Medtronic dual-chamber  device  . Sleep apnea   . VITAMIN B12 DEFICIENCY    Social History   Socioeconomic History  . Marital status: Married    Spouse name: husband has dementia  . Number of children: None  . Years of education: 8  . Highest education level: None  Social Needs  . Financial resource strain: None  . Food insecurity - worry: None  . Food insecurity - inability: None  . Transportation needs - medical: None  . Transportation needs - non-medical: None  Occupational History  . Occupation: retired    Associate Professor: RETIRED  Tobacco Use  . Smoking status: Never Smoker  . Smokeless tobacco: Never Used  Substance and Sexual Activity  . Alcohol use: No    Alcohol/week: 0.0 oz  . Drug use: No  . Sexual activity: Not Currently  Other Topics Concern  . None  Social History Narrative  . None   Family History  Problem Relation Age of Onset  . COPD Father   . Heart attack Father 65  . Heart failure Mother   . Cancer Sister        colon  . Cancer Brother        lung  . Cancer Sister        bladder  . Cancer Brother        lung  . Early death Son   . Diabetes Son   . Heart disease Son   . Cancer Son        prostate  . Cancer Sister        kidney cancer  . Diabetes Sister   . Cancer Daughter    Scheduled Meds: . aspirin EC  81 mg Oral Daily  . enoxaparin (LOVENOX) injection  40 mg Subcutaneous Q24H  . furosemide  40 mg Oral BID  . gabapentin  300 mg Oral TID  . insulin aspart  0-15 Units Subcutaneous TID WC  . insulin aspart  0-5 Units Subcutaneous QHS  . levothyroxine  200 mcg Oral QAC breakfast  . lisinopril  10 mg Oral Daily  . metoprolol succinate  12.5 mg Oral Daily  . mometasone-formoterol  2 puff Inhalation BID  . potassium chloride  20 mEq Oral Daily  . pravastatin  80 mg Oral Daily  . sertraline  50 mg Oral Daily  . sodium chloride flush  3 mL Intravenous Q12H   Continuous Infusions: . sodium chloride     PRN Meds:.sodium chloride, acetaminophen, albuterol,  ALPRAZolam, ondansetron (ZOFRAN) IV, sodium chloride flush Medications Prior to Admission:  Prior to Admission medications   Medication Sig Start Date End Date Taking? Authorizing Provider  ALPRAZolam Prudy Feeler) 0.5 MG tablet Take 1 tablet (0.5 mg total) by mouth at  bedtime as needed for anxiety. 05/14/17  Yes Hawks, Christy A, FNP  aspirin EC 81 MG tablet Take 81 mg by mouth daily. Reported on 12/14/2015   Yes [provider]  budesonide-formoterol (SYMBICORT) 80-4.5 MCG/ACT inhaler Inhale 2 puffs into the lungs 2 (two) times daily. 03/06/16  Yes Hawks, Christy A, FNP  clotrimazole (LOTRIMIN) 1 % cream Apply to affected right groin area twice daily 07/01/17  Yes Hawks, Christy A, FNP  cyclobenzaprine (FLEXERIL) 10 MG tablet Take 1 tablet by mouth 3 (three) times daily as needed f or muscle spasms. 02/07/17  Yes Hawks, Christy A, FNP  fluconazole (DIFLUCAN) 100 MG tablet Take 1 tablet (100 mg total) by mouth daily for 4 days. 06/30/17 07/04/17 Yes Johnson, Clanford L, MD  furosemide (LASIX) 40 MG tablet Take 1 tablet (40 mg total) by mouth daily. Reported on 12/14/2015 07/01/17  Yes Hawks, Christy A, FNP  gabapentin (NEURONTIN) 300 MG capsule TAKE (1) CAPSULE THREE TIMES DAILY. 06/13/17  Yes Hawks, Christy A, FNP  glimepiride (AMARYL) 4 MG tablet TAKE (1) TABLET DAILY BE- FORE BREAKFAST. 05/29/17  Yes Hawks, Christy A, FNP  HYDROcodone-acetaminophen (NORCO/VICODIN) 5-325 MG tablet Take 1 tablet by mouth every 6 (six) hours as needed for moderate pain. 05/14/17  Yes Hawks, Christy A, FNP  insulin NPH Human (HUMULIN N) 100 UNIT/ML injection Inject 0.08 mLs (8 Units total) into the skin 2 (two) times daily before a meal. 06/29/17  Yes Johnson, Clanford L, MD  levothyroxine (SYNTHROID, LEVOTHROID) 200 MCG tablet TAKE (1) TABLET DAILY BE- FORE BREAKFAST. 03/29/17  Yes Hawks, Christy A, FNP  meloxicam (MOBIC) 7.5 MG tablet Take 1 tablet (7.5 mg total) by mouth daily as needed for pain. 06/29/17  Yes Johnson,  Clanford L, MD  metFORMIN (GLUCOPHAGE) 1000 MG tablet TAKE (1) TABLET TWICE A DAY WITH MEALS (BREAKFAST AND SUPPER) FOR DIAB ETES 06/17/17  Yes Hawks, Christy A, FNP  metoprolol succinate (TOPROL-XL) 25 MG 24 hr tablet Take 1 tablet (25 mg total) by mouth daily. Take with or immediately following a meal. Patient taking differently: Take 12.5 mg by mouth daily. Take with or immediately following a meal. 06/18/17  Yes Hawks, Christy A, FNP  nitroGLYCERIN (NITROLINGUAL) 0.4 MG/SPRAY spray Place 1 spray under the tongue every 5 (five) minutes as needed. 10/28/15  Yes Stacks, Broadus John, MD  omeprazole (PRILOSEC) 20 MG capsule Take 20 mg by mouth daily.   Yes [provider]  potassium chloride 20 MEQ TBCR Take 20 mEq by mouth daily. 06/29/17 07/29/17 Yes Johnson, Clanford L, MD  pravastatin (PRAVACHOL) 80 MG tablet TAKE 1 TABLET ONCE DAILY FOR CHOLESTEROL 05/29/17  Yes Junie Spencer, FNP  PROAIR HFA 108 [phone] Base) MCG/ACT inhaler 2 PUFFS EVERY 6 HOURS AS NEEDED FOR WHEEZING 01/12/16  Yes Jannifer Rodney A, FNP  sertraline (ZOLOFT) 50 MG tablet TAKE 1 TABLET DAILY 05/29/17  Yes Hawks, Christy A, FNP  lisinopril (PRINIVIL,ZESTRIL) 10 MG tablet TAKE 1 TABLET DAILY 11/26/16   Junie Spencer, FNP   Allergies  Allergen Reactions  . Penicillins     Has patient had a PCN reaction causing immediate rash, facial/tongue/throat swelling, SOB or lightheadedness with hypotension: Unknown Has patient had a PCN reaction causing severe rash involving mucus membranes or skin necrosis: Unknown Has patient had a PCN reaction that required hospitalization: Unknown Has patient had a PCN reaction occurring within the last 10 years: No If all of the above answers are "NO", then may proceed with Cephalosporin use.  Review of Systems  Unable to perform ROS: Age    Physical Exam  Constitutional: She appears ill.  Appears chronically ill, frail  HENT:  Head: Atraumatic.  Some periorbital wasting  Cardiovascular:  Normal rate.  Pulmonary/Chest: Effort normal. No respiratory distress.  Abdominal: Soft. She exhibits no distension and no ascites.  Neurological: She is alert.  Unable to determine orientation.  Skin: Skin is warm and dry.  Nursing note and vitals reviewed.   Vital Signs: BP (!) 133/51   Pulse 63   Temp 97.7 F (36.5 C)   Resp 18   Ht 5\' 10"  (1.778 m)   Wt 86.6 kg (190 lb 14.7 oz)   SpO2 98%   BMI 27.39 kg/m  Pain Assessment: No/denies pain   Pain Score: 6    SpO2: SpO2: 98 % O2 Device:SpO2: 98 % O2 Flow Rate: .   IO: Intake/output summary:   Intake/Output Summary (Last 24 hours) at 07/02/2017 1659 Last data filed at 07/02/2017 [id number] Gross per 24 hour  Intake 240 ml  Output 1000 ml  Net -760 ml    LBM: Last BM Date: 07/01/17 Baseline Weight: Weight: 87.5 kg (193 lb) Most recent weight: Weight: 86.6 kg (190 lb 14.7 oz)     Palliative Assessment/Data:   Flowsheet Rows     Most Recent Value  Intake Tab  Referral Department  Hospitalist  Unit at Time of Referral  Cardiac/Telemetry Unit  Palliative Care Primary Diagnosis  Cardiac  Date Notified  07/02/17  Palliative Care Type  New Palliative care  Reason for referral  Clarify Goals of Care  Date of Admission  07/01/17  Date first seen by Palliative Care  07/02/17  # of days Palliative referral response time  0 Day(s)  # of days IP prior to Palliative referral  1  Clinical Assessment  Palliative Performance Scale Score  30%  Pain Max last 24 hours  Not able to report  Pain Min Last 24 hours  Not able to report  Dyspnea Max Last 24 Hours  Not able to report  Dyspnea Min Last 24 hours  Not able to report  Psychosocial & Spiritual Assessment  Palliative Care Outcomes  Palliative Care Outcomes  Clarified goals of care      Time In: 1630 Time Out: 1700 Time Total: 30 minutes Greater than 50%  of this time was spent counseling and coordinating care related to the above assessment and plan.  Signed  by: Katheran Aweasha A Kaiden Pech, NP   Please contact Palliative Medicine Team phone at [phone] for questions and concerns.  For individual provider: See Loretha StaplerAmion

## 2017-07-02 NOTE — H&P (Signed)
[facility] History and Physical  [name] [name] IHK:[id number] DOB: 03-20-1933 DOA: 07/01/2017  Referring physician:  PCP: Junie Spencer, FNP   Chief Complaint: "I was getting choked up."  HPI: [name] [name] is a 84 y.o. female  with significant pmhx of COPD, DM, CHF and CVD. Pt state she had choking and sob sensation this evening. She state she has been taking all of her meds. Has been coughing up white sputum. Pt has no hx of thoracentesis. No fevers.   ED Course: Diuresis started in ED. CXR shows CHF. Hospitalist consulted for admit.   Review of Systems:  As per HPI otherwise 10 point review of systems negative.    Past Medical History:  Diagnosis Date  . ANXIETY   . Arteriosclerotic cardiovascular disease (ASCVD)    Cath in 9/99-70% mid LAD with diffuse distal disease, 70% T1, 60% mid circumflex, 50% mid RCA, normal ejection fraction; negative stress nuclear in 07/2008  . Carpal tunnel syndrome   . Cerebrovascular disease    carotid bruits; no focal disease in 1999, 2002 and 2006  . Closed left fibular fracture 11/30/11  . COPD   . Diabetes mellitus    Insulin treatment  . Diastolic CHF, chronic (HCC)    Normal EF  . Gastroesophageal reflux disease    With hiatal hernia; irritable bowel syndrome; H. pylori-treated; Colonoscopy 2008: non-specific colitis, IH, Diverticulosis, Rectal ulcer secondary to ASA  . Hyperlipidemia   . Hypertension   . Hypothyroidism   . OSTEOARTHRITIS, KNEES, BILATERAL    Bilateral TKA  . Pacemaker   . Peripheral neuropathy   . Sinoatrial node dysfunction (HCC) 2003   Medtronic dual-chamber device  . Sleep apnea   . VITAMIN B12 DEFICIENCY    Past Surgical History:  Procedure Laterality Date  . ABDOMINAL HYSTERECTOMY    . BLADDER REPAIR    . CARDIAC PACEMAKER PLACEMENT  2003   Medtronic, dual-chamber  . CARPAL TUNNEL RELEASE    . CHOLECYSTECTOMY    . COLONOSCOPY  2008  . HEMORRHOID SURGERY    . NASAL FRACTURE SURGERY    .  THYROIDECTOMY  1980   Goiter  . TOTAL KNEE ARTHROPLASTY     Bilateral   Social History:  reports that  has never smoked. she has never used smokeless tobacco. She reports that she does not drink alcohol or use drugs.  Allergies  Allergen Reactions  . Penicillins     Has patient had a PCN reaction causing immediate rash, facial/tongue/throat swelling, SOB or lightheadedness with hypotension: Unknown Has patient had a PCN reaction causing severe rash involving mucus membranes or skin necrosis: Unknown Has patient had a PCN reaction that required hospitalization: Unknown Has patient had a PCN reaction occurring within the last 10 years: No If all of the above answers are "NO", then may proceed with Cephalosporin use.     Family History  Problem Relation Age of Onset  . COPD Father   . Heart attack Father 73  . Heart failure Mother   . Cancer Sister        colon  . Cancer Brother        lung  . Cancer Sister        bladder  . Cancer Brother        lung  . Early death Son   . Diabetes Son   . Heart disease Son   . Cancer Son        prostate  . Cancer Sister  kidney cancer  . Diabetes Sister   . Cancer Daughter      Prior to Admission medications   Medication Sig Start Date End Date Taking? Authorizing Provider  aspirin EC 81 MG tablet Take 81 mg by mouth daily. Reported on 12/14/2015   Yes [provider]  budesonide-formoterol (SYMBICORT) 80-4.5 MCG/ACT inhaler Inhale 2 puffs into the lungs 2 (two) times daily. 03/06/16  Yes Hawks, Christy A, FNP  HYDROcodone-acetaminophen (NORCO/VICODIN) 5-325 MG tablet Take 1 tablet by mouth every 6 (six) hours as needed for moderate pain. 05/14/17  Yes Hawks, Christy A, FNP  insulin NPH Human (HUMULIN N) 100 UNIT/ML injection Inject 0.08 mLs (8 Units total) into the skin 2 (two) times daily before a meal. 06/29/17  Yes Johnson, Clanford L, MD  metoprolol succinate (TOPROL-XL) 25 MG 24 hr tablet Take 1 tablet (25 mg total) by  mouth daily. Take with or immediately following a meal. Patient taking differently: Take 12.5 mg by mouth daily. Take with or immediately following a meal. 06/18/17  Yes Hawks, Christy A, FNP  nitroGLYCERIN (NITROLINGUAL) 0.4 MG/SPRAY spray Place 1 spray under the tongue every 5 (five) minutes as needed. 10/28/15  Yes Stacks, Broadus JohnWarren, MD  potassium chloride 20 MEQ TBCR Take 20 mEq by mouth daily. 06/29/17 07/29/17 Yes Johnson, Clanford L, MD  PROAIR HFA 108 (90 Base) MCG/ACT inhaler 2 PUFFS EVERY 6 HOURS AS NEEDED FOR WHEEZING 01/12/16  Yes Hawks, Christy A, FNP  ALPRAZolam (XANAX) 0.5 MG tablet Take 1 tablet (0.5 mg total) by mouth at bedtime as needed for anxiety. 05/14/17   Junie SpencerHawks, Christy A, FNP  clotrimazole (LOTRIMIN) 1 % cream Apply to affected right groin area twice daily 07/01/17   Jannifer RodneyHawks, Christy A, FNP  cyclobenzaprine (FLEXERIL) 10 MG tablet Take 1 tablet by mouth 3 (three) times daily as needed f or muscle spasms. 02/07/17   Jannifer RodneyHawks, Christy A, FNP  fluconazole (DIFLUCAN) 100 MG tablet Take 1 tablet (100 mg total) by mouth daily for 4 days. 06/30/17 07/04/17  Johnson, Clanford L, MD  furosemide (LASIX) 40 MG tablet Take 1 tablet (40 mg total) by mouth daily. Reported on 12/14/2015 07/01/17   Junie SpencerHawks, Christy A, FNP  gabapentin (NEURONTIN) 300 MG capsule TAKE (1) CAPSULE THREE TIMES DAILY. 06/13/17   Jannifer RodneyHawks, Christy A, FNP  glimepiride (AMARYL) 4 MG tablet TAKE (1) TABLET DAILY BE- FORE BREAKFAST. 05/29/17   Jannifer RodneyHawks, Christy A, FNP  levothyroxine (SYNTHROID, LEVOTHROID) 200 MCG tablet TAKE (1) TABLET DAILY BE- FORE BREAKFAST. 03/29/17   Junie SpencerHawks, Christy A, FNP  lisinopril (PRINIVIL,ZESTRIL) 10 MG tablet TAKE 1 TABLET DAILY 11/26/16   Jannifer RodneyHawks, Christy A, FNP  meloxicam (MOBIC) 7.5 MG tablet Take 1 tablet (7.5 mg total) by mouth daily as needed for pain. 06/29/17   Johnson, Clanford L, MD  metFORMIN (GLUCOPHAGE) 1000 MG tablet TAKE (1) TABLET TWICE A DAY WITH MEALS (BREAKFAST AND SUPPER) FOR DIAB ETES 06/17/17   Jannifer RodneyHawks,  Christy A, FNP  pravastatin (PRAVACHOL) 80 MG tablet TAKE 1 TABLET ONCE DAILY FOR CHOLESTEROL 05/29/17   Jannifer RodneyHawks, Christy A, FNP  sertraline (ZOLOFT) 50 MG tablet TAKE 1 TABLET DAILY 05/29/17   Junie SpencerHawks, Christy A, FNP   Physical Exam: Vitals:   07/01/17 2033 07/02/17 0016  BP: (!) 156/74 (!) 191/77  Pulse: 67 60  Resp: 18 (!) 22  Temp: 97.6 F (36.4 C) 98.1 F (36.7 C)  TempSrc: Oral Oral  SpO2: 94% 95%  Weight: 87.5 kg (193 lb)   Height: 5\' 4"  (1.626  m)     Wt Readings from Last 3 Encounters:  07/01/17 87.5 kg (193 lb)  06/29/17 88.4 kg (194 lb 14.2 oz)  05/14/17 90.3 kg (199 lb)    General:  Appears calm and comfortable; a&Ox3, hard of hearing Eyes:  PERRL, EOMI, normal lids, iris ENT:  grossly normal hearing, lips & tongue Neck:  no LAD, masses or thyromegaly Cardiovascular:  RRR, no m/r/g. No LE edema.  Respiratory:  Tachypnea, diffuse wheeze, incr wob Abdomen:  soft, ntnd Skin:  no rash or induration seen on limited exam Musculoskeletal:  grossly normal tone BUE/BLE Psychiatric:  grossly normal mood and affect, speech fluent and appropriate Neurologic:  CN 2-12 grossly intact, moves all extremities in coordinated fashion.          Labs on Admission:  Basic Metabolic Panel: Recent Labs  Lab 06/28/17 0111 06/28/17 0349 06/29/17 0714 07/01/17 2201  NA 136  --  139 134*  K 3.6  --  3.7 4.6  CL 100*  --  101 98*  CO2 27  --  29 21*  GLUCOSE 159*  --  108* 126*  BUN 15  --  14 14  CREATININE 0.77  --  0.74 0.76  CALCIUM 8.3*  --  8.4* 9.7  MG  --  0.9* 1.4*  --   PHOS  --  3.1  --   --    Liver Function Tests: Recent Labs  Lab 06/28/17 0349 07/01/17 2201  AST 32 35  ALT 19 20  ALKPHOS 58 69  BILITOT 0.8 0.9  PROT 6.0* 7.7  ALBUMIN 3.2* 4.2   No results for input(s): LIPASE, AMYLASE in the last 168 hours. No results for input(s): AMMONIA in the last 168 hours. CBC: Recent Labs  Lab 06/28/17 0111 07/01/17 2254  WBC 6.5 8.2  NEUTROABS 4.3 5.1    HGB 11.5* 12.9  HCT 35.4* 39.4  MCV 89.6 89.3  PLT 194 126*   Cardiac Enzymes: Recent Labs  Lab 06/28/17 0111 06/28/17 0349 06/28/17 0950 06/28/17 1540 06/28/17 2108  TROPONINI 0.04* 0.05* 0.04* 0.04* 0.03*    BNP (last 3 results) Recent Labs    06/28/17 0111 07/01/17 2254  BNP 1,485.0* 1,641.0*    ProBNP (last 3 results) No results for input(s): PROBNP in the last 8760 hours.   Serum creatinine: 0.76 mg/dL 40/98/11 9147 Estimated creatinine clearance: 58 mL/min  CBG: Recent Labs  Lab 06/28/17 1143 06/28/17 1613 06/28/17 2102 06/29/17 0815 06/29/17 1108  GLUCAP 133* 150* 126* 92 179*    Radiological Exams on Admission: Dg Chest 2 View  Result Date: 07/01/2017 CLINICAL DATA:  Dyspnea and cough today. EXAM: CHEST  2 VIEW COMPARISON:  06/27/2017 FINDINGS: Heart and mediastinal contours are stable with aortic atherosclerosis. No aneurysm. Moderate right and trace left effusions are stable. Right atrial and right ventricular leads are noted with left-sided pacemaker apparatus projecting over the left upper lobe. Mild interstitial edema is stable. No acute osseous abnormality. Osteoarthritis of the [facility] and glenohumeral joints bilaterally. IMPRESSION: 1. Chronic stable mild interstitial edema with moderate right and trace left pleural effusions. 2. Aortic atherosclerosis. Electronically Signed   By: Tollie Eth M.D.   On: 07/01/2017 22:48    EKG: Independently reviewed. AV dual paced  Assessment/Plan Active Problems:   Acute heart failure (HCC)   Acute heart failure Has failed outpatient treatments, would likely benefit from therapeutic thoracentesis Serial troponin stable Diuresis IV Fluid seen on CHF Need for ECHO eval in AM, recent ECHO  1/18 Ins and outs Continuous pulse ox Cont lisinopril, toprol, potassium  Hypothyroidism Cont OP synthroid 200 mcg qd No signs of hyper or hypothyroidism  Hypertension When necessary hydralazine 10 mg IV as needed for  severe blood pressure  DM SSI AC Hold oral meds  COPD Prn albuterol Cont symbicort->dulera  Yeast Skin Infxn Start nystatin powder Diflucan till 1/24  Anxiety Prn xanax Cont zoloft  Chronic pain TID gabapentin  Hyperlipidemia Continue statin   Code Status: FC  DVT Prophylaxis: SCD Family Communication: none available Disposition Plan: Pending Improvement  Status: tele obs  Haydee Salter, MD Family Medicine [facility] [email or web address] Password [email or web address]

## 2017-07-02 NOTE — Care Management Obs Status (Signed)
MEDICARE OBSERVATION STATUS NOTIFICATION   Patient Details  Name: [name] [name] M [place] MRN: [id number] Date of Birth: January 18, 1933   Medicare Observation Status Notification Given:  Yes    Aminta Sakurai, Chrystine OilerSharley Diane, RN 07/02/2017, 11:21 AM

## 2017-07-02 NOTE — Procedures (Signed)
PreOperative Dx: RIGHT pleural effusion Postoperative Dx: RIGHT pleural effusion Procedure:   US guided RIGHT thoracentesis Radiologist:  Bridgette Wolden Anesthesia:  10 ml of 1% lidocaine Specimen:  900 mL of amber colored fluid EBL:   < 1 ml Complications: None  

## 2017-07-02 NOTE — Progress Notes (Addendum)
PROGRESS NOTE  [name] [name] ZOX:[id number] DOB: 1932-12-07 DOA: 07/01/2017 PCP: Junie Spencer, FNP  Brief History:  84 year old female with a history of systolic and diastolic CHF, COPD, diabetes mellitus, hypertension, hyperlipidemia, diabetes mellitus, sinus node dysfunction s/p PPM, hypothyroidism, coronary artery disease presenting with 1 day history of shortness of breath that started on the evening of 07/01/2017.  This was associated with orthopnea and coughing with clear sputum.  The patient was recently discharged from the hospital after a stay from 06/27/17 through 06/29/17.  She was treated for CHF decompensation at that time and discharged home with furosemide 40 mg daily.  Her discharge weight was 194 pounds.  She endorsed compliance with her medications.  The patient was started on intravenous furosemide with good clinical effect.  Assessment/Plan: Acute on chronic systolic and diastolic CHF --daily weights--NEG 4 lbs -accurate I/O's--not accurate -fluid restrict -06/28/2017 echo EF 35-40%, diffuse HK, grade 1 DD, PASP 42, mild TR -continue IV Lasix>> transition to oral Lasix -Appears close to euvolemic -Continue metoprolol succinate -am BMP -may need to increase home dose to 40 mg bid -Personally reviewed chest x-ray--bilateral pleural effusion, right greater than left, increased interstitial markings  -Personally reviewed EKG--AV paced  COPD -Stable on room air -Continue bronchodilators  Pleural Effusion -R>L -07/02/17 thoracocentesis--900cc -follow up cell count, LDH, protein  Essential hypertension -Continue lisinopril and metoprolol succinate -May need to titrate up lisinopril -avoiding CCB due to depressed EF  Diabetes mellitus type 2 -Continue NovoLog sliding scale -CBGs controlled -Outpatient NPH on hold -Holding Amaryl and metformin -Check hemoglobin A1c  Hypothyroidism -Continue Synthroid  Hyperlipidemia -Continue statin  Tinea  cruris -Discontinue fluconazole -Overall improved  Depression/anxiety -Continue home dose of  Zoloft and alprazolam      Disposition Plan:   Home 1/23 if stable Family Communication:  No  Family at bedside  Consultants:  none  Code Status:  FULL   DVT Prophylaxis:  [place] Lovenox   Procedures: As Listed in Progress Note Above  Antibiotics: None    Subjective: Patient denies fevers, chills, headache, chest pain, dyspnea, nausea, vomiting, diarrhea, abdominal pain, dysuria, hematuria, hematochezia, and melena. She is able to lay flat   Objective: Vitals:   07/02/17 0253 07/02/17 0900 07/02/17 1115 07/02/17 1139  BP: (!) 188/82  (!) 159/82 (!) 159/67  Pulse: 67  67 62  Resp: 20  18 18   Temp: 98.4 F (36.9 C)     TempSrc: Oral     SpO2: 94% 94% 93% 95%  Weight: 86.6 kg (190 lb 14.7 oz)     Height: 5\' 10"  (1.778 m)       Intake/Output Summary (Last 24 hours) at 07/02/2017 1310 Last data filed at 07/02/2017 0946 Gross per 24 hour  Intake 240 ml  Output 1000 ml  Net -760 ml   Weight change:  Exam:   General:  Pt is alert, follows commands appropriately, not in acute distress  HEENT: No icterus, No thrush, No neck mass, [place]/AT  Cardiovascular: RRR, S1/S2, no rubs, no gallops  Respiratory: Bibasilar crackles.  No wheezing.  Abdomen: Soft/+BS, non tender, non distended, no guarding  Extremities: trace LE edema, No lymphangitis, No petechiae, No rashes, no synovitis   Data Reviewed: I have personally reviewed following labs and imaging studies Basic Metabolic Panel: Recent Labs  Lab 06/28/17 0111 06/28/17 0349 06/29/17 0714 07/01/17 2201  NA 136  --  139 134*  K 3.6  --  3.7 4.6  CL 100*  --  101 98*  CO2 27  --  29 21*  GLUCOSE 159*  --  108* 126*  BUN 15  --  14 14  CREATININE 0.77  --  0.74 0.76  CALCIUM 8.3*  --  8.4* 9.7  MG  --  0.9* 1.4*  --   PHOS  --  3.1  --   --    Liver Function Tests: Recent Labs  Lab 06/28/17 0349  07/01/17 2201  AST 32 35  ALT 19 20  ALKPHOS 58 69  BILITOT 0.8 0.9  PROT 6.0* 7.7  ALBUMIN 3.2* 4.2   No results for input(s): LIPASE, AMYLASE in the last 168 hours. No results for input(s): AMMONIA in the last 168 hours. Coagulation Profile: No results for input(s): INR, PROTIME in the last 168 hours. CBC: Recent Labs  Lab 06/28/17 0111 07/01/17 2254  WBC 6.5 8.2  NEUTROABS 4.3 5.1  HGB 11.5* 12.9  HCT 35.4* 39.4  MCV 89.6 89.3  PLT 194 126*   Cardiac Enzymes: Recent Labs  Lab 06/28/17 0111 06/28/17 0349 06/28/17 0950 06/28/17 1540 06/28/17 2108  TROPONINI 0.04* 0.05* 0.04* 0.04* 0.03*   BNP: Invalid input(s): POCBNP CBG: Recent Labs  Lab 06/28/17 2102 06/29/17 0815 06/29/17 1108 07/02/17 0742 07/02/17 1215  GLUCAP 126* 92 179* 121* 154*   HbA1C: No results for input(s): HGBA1C in the last 72 hours. Urine analysis:    Component Value Date/Time   COLORURINE COLORLESS (A) 07/01/2017 2059   APPEARANCEUR CLEAR 07/01/2017 2059   APPEARANCEUR Clear 08/26/2015 1540   LABSPEC 1.004 (L) 07/01/2017 2059   PHURINE 7.0 07/01/2017 2059   GLUCOSEU NEGATIVE 07/01/2017 2059   GLUCOSEU NEGATIVE 12/23/2012 1110   HGBUR NEGATIVE 07/01/2017 2059   HGBUR large 05/16/2010 0746   BILIRUBINUR NEGATIVE 07/01/2017 2059   BILIRUBINUR Negative 08/26/2015 1540   KETONESUR NEGATIVE 07/01/2017 2059   PROTEINUR NEGATIVE 07/01/2017 2059   UROBILINOGEN negative 07/05/2015 1431   UROBILINOGEN 0.2 12/23/2012 1110   NITRITE NEGATIVE 07/01/2017 2059   LEUKOCYTESUR NEGATIVE 07/01/2017 2059   LEUKOCYTESUR Negative 08/26/2015 1540   Sepsis Labs: @LABRCNTIP (procalcitonin:4,lacticidven:4) ) Recent Results (from the past 240 hour(s))  MRSA PCR Screening     Status: Abnormal   Collection Time: 06/28/17  4:17 AM  Result Value Ref Range Status   MRSA by PCR POSITIVE (A) NEGATIVE Final    Comment: RESULT CALLED TO, READ BACK BY AND VERIFIED WITH: MCDANIEL M. AT 1221P ON [id number] BY  THOMPSON S.        The GeneXpert MRSA Assay (FDA approved for NASAL specimens only), is one component of a comprehensive MRSA colonization surveillance program. It is not intended to diagnose MRSA infection nor to guide or monitor treatment for MRSA infections.      Scheduled Meds: . aspirin EC  81 mg Oral Daily  . fluconazole  100 mg Oral Daily  . furosemide  80 mg Intravenous BID  . gabapentin  300 mg Oral TID  . insulin aspart  0-15 Units Subcutaneous TID WC  . levothyroxine  200 mcg Oral QAC breakfast  . lisinopril  10 mg Oral Daily  . metoprolol succinate  12.5 mg Oral Daily  . mometasone-formoterol  2 puff Inhalation BID  . potassium chloride  20 mEq Oral Daily  . pravastatin  80 mg Oral Daily  . sertraline  50 mg Oral Daily  . sodium chloride flush  3 mL Intravenous Q12H   Continuous Infusions: . sodium  chloride      Procedures/Studies: Dg Chest 1 View  Result Date: 07/02/2017 CLINICAL DATA:  Post RIGHT thoracentesis EXAM: CHEST 1 VIEW COMPARISON:  07/01/2017 FINDINGS: Expiratory technique. LEFT subclavian pacemaker leads project over RIGHT atrium and RIGHT ventricle unchanged. Enlargement of cardiac silhouette with slight pulmonary vascular congestion. Atherosclerotic calcifications aorta. Bibasilar atelectasis. Probable small subpulmonic LEFT pleural effusion with significantly decreased RIGHT pleural effusion post thoracentesis. No pneumothorax or acute infiltrate. Bones demineralized. IMPRESSION: No pneumothorax following RIGHT thoracentesis. Small bibasilar pleural effusions and basilar atelectasis greater on LEFT. Electronically Signed   By: Ulyses Southward M.D.   On: 07/02/2017 12:27   Dg Chest 2 View  Result Date: 07/01/2017 CLINICAL DATA:  Dyspnea and cough today. EXAM: CHEST  2 VIEW COMPARISON:  06/27/2017 FINDINGS: Heart and mediastinal contours are stable with aortic atherosclerosis. No aneurysm. Moderate right and trace left effusions are stable. Right atrial  and right ventricular leads are noted with left-sided pacemaker apparatus projecting over the left upper lobe. Mild interstitial edema is stable. No acute osseous abnormality. Osteoarthritis of the [facility] and glenohumeral joints bilaterally. IMPRESSION: 1. Chronic stable mild interstitial edema with moderate right and trace left pleural effusions. 2. Aortic atherosclerosis. Electronically Signed   By: Tollie Eth M.D.   On: 07/01/2017 22:48   Dg Chest 2 View  Result Date: 06/28/2017 CLINICAL DATA:  84 y/o F; shortness of breath, cough, and mid lower chest pain for 2 days. EXAM: CHEST  2 VIEW COMPARISON:  03/14/2016 chest radiograph FINDINGS: Stable cardiac silhouette given projection and technique. Aortic atherosclerosis with calcification. Two lead pacemaker. Moderate right and trace left pleural effusions. Reticular opacities of the lungs. No pneumothorax. No acute osseous abnormality is evident. IMPRESSION: Moderate right and trace left pleural effusions, right basilar opacity may represent associated atelectasis or pneumonia. Reticular opacities of lungs, probably interstitial edema. Electronically Signed   By: Mitzi Hansen M.D.   On: 06/28/2017 00:45   US Thoracentesis Asp Pleural Space W/img Guide  Result Date: 07/02/2017 INDICATION: RIGHT pleural effusion EXAM: ULTRASOUND GUIDED DIAGNOSTIC AND THERAPEUTIC RIGHT THORACENTESIS MEDICATIONS: None. COMPLICATIONS: None immediate. PROCEDURE: Procedure, benefits, and risks of procedure were discussed with patient. Written informed consent for procedure was obtained. Time out protocol followed. Pleural effusion localized by ultrasound at the posterior RIGHT hemithorax. Skin prepped and draped in usual sterile fashion. Skin and soft tissues anesthetized with 10 mL of 1% lidocaine. 8 French thoracentesis catheter placed into the RIGHT pleural space. 900 mL of amber colored RIGHT pleural fluid aspirated by syringe pump. Procedure tolerated well by patient  without immediate complication. FINDINGS: A total of approximately 900 mL of RIGHT pleural fluid was removed. Samples were sent to the laboratory as requested by the clinical team. IMPRESSION: Successful ultrasound guided RIGHT thoracentesis yielding 900 mL of pleural fluid. Electronically Signed   By: Ulyses Southward M.D.   On: 07/02/2017 12:24    Catarina Hartshorn, DO  [facility] Pager [phone]  If 7PM-7AM, please contact night-coverage [email or web address] Password [email or web address] 07/02/2017, 1:10 PM   LOS: 0 days

## 2017-07-03 DIAGNOSIS — I5043 Acute on chronic combined systolic (congestive) and diastolic (congestive) heart failure: Secondary | ICD-10-CM | POA: Diagnosis not present

## 2017-07-03 LAB — GLUCOSE, CAPILLARY
GLUCOSE-CAPILLARY: 132 mg/dL — AB (ref 65–99)
GLUCOSE-CAPILLARY: 178 mg/dL — AB (ref 65–99)

## 2017-07-03 LAB — BASIC METABOLIC PANEL
ANION GAP: 11 (ref 5–15)
BUN: 20 mg/dL (ref 6–20)
CALCIUM: 9.2 mg/dL (ref 8.9–10.3)
CO2: 28 mmol/L (ref 22–32)
Chloride: 96 mmol/L — ABNORMAL LOW (ref 101–111)
Creatinine, Ser: 0.86 mg/dL (ref 0.44–1.00)
GFR calc Af Amer: >60 mL/min (ref >60)
GLUCOSE: 146 mg/dL — AB (ref 65–99)
Potassium: 3.9 mmol/L (ref 3.5–5.1)
Sodium: 135 mmol/L (ref 135–145)

## 2017-07-03 LAB — CBC
HCT: 36.2 % (ref 36.0–46.0)
Hemoglobin: 11.8 g/dL — ABNORMAL LOW (ref 12.0–15.0)
MCH: 29.2 pg (ref 26.0–34.0)
MCHC: 32.6 g/dL (ref 30.0–36.0)
MCV: 89.6 fL (ref 78.0–100.0)
PLATELETS: 222 10*3/uL (ref 150–400)
RBC: 4.04 MIL/uL (ref 3.87–5.11)
RDW: 13.3 % (ref 11.5–15.5)
WBC: 5.7 10*3/uL (ref 4.0–10.5)

## 2017-07-03 LAB — MAGNESIUM: MAGNESIUM: 1.3 mg/dL — AB (ref 1.7–2.4)

## 2017-07-03 MED ORDER — FUROSEMIDE 40 MG PO TABS: 40.0000 mg | ORAL_TABLET | Freq: Two times a day (BID) | ORAL | 2 refills | Status: DC

## 2017-07-03 NOTE — Care Management Note (Signed)
Case Management Note  Patient Details  Name: [name] [name] MRN: [id number] Date of Birth: 1932-12-16    Expected Discharge Date:     07/03/2017             Expected Discharge Plan:  Home w Home Health Services  In-House Referral:     Discharge planning Services  CM Consult  Post Acute Care Choice:  Home Health, Resumption of Svcs/PTA Provider Choice offered to:  Patient  DME Arranged:    DME Agency:     HH Arranged:    HH Agency:  [facility]  Status of Service:  Completed, signed off  If discussed at Long Length of Stay Meetings, dates discussed:    Additional Comments: Patient discharging home today. She is active with [facility] for RN, PT, and SW. She has cane, RW and BSC at home if needed. Her husband is currently at rehab. She has a daughter who is retired and a friend who helps her if needed. Her son in law checks on her and does any grocery shopping she needs. Spoke with daughter Lyla SonCardilia, we discuss home health and all services that patient will receive, RN, PT and SW. She reports that someone has suggested CAP services for her mom and I explain what that is and steps they will need to take to pursue. She will transport patient home today.   Yoshie Kosel, Chrystine OilerSharley Diane, RN 07/03/2017, 11:32 AM

## 2017-07-03 NOTE — Progress Notes (Signed)
Patient still alert, asking for something to eat now. Patient ambulated to bathrrom and back to bed with assistance. U/A not collected d/t dirty collection hat. Clean collection hat in place at this time. Will continue to monitor.

## 2017-07-03 NOTE — Discharge Instructions (Signed)
Heart Failure °Heart failure means your heart has trouble pumping blood. This makes it hard for your body to work well. Heart failure is usually a long-term (chronic) condition. You must take good care of yourself and follow your doctor's treatment plan. °Follow these instructions at home: °· Take your heart medicine as told by your doctor. °? Do not stop taking medicine unless your doctor tells you to. °? Do not skip any dose of medicine. °? Refill your medicines before they run out. °? Take other medicines only as told by your doctor or pharmacist. °· Stay active if told by your doctor. The elderly and people with severe heart failure should talk with a doctor about physical activity. °· Eat heart-healthy foods. Choose foods that are without trans fat and are low in saturated fat, cholesterol, and salt (sodium). This includes fresh or frozen fruits and vegetables, fish, lean meats, fat-free or low-fat dairy foods, whole grains, and high-fiber foods. Lentils and dried peas and beans (legumes) are also good choices. °· Limit salt if told by your doctor. °· Cook in a healthy way. Roast, grill, broil, bake, poach, steam, or stir-fry foods. °· Limit fluids as told by your doctor. °· Weigh yourself every morning. Do this after you pee (urinate) and before you eat breakfast. Write down your weight to give to your doctor. °· Take your blood pressure and write it down if your doctor tells you to. °· Ask your doctor how to check your pulse. Check your pulse as told. °· Lose weight if told by your doctor. °· Stop smoking or chewing tobacco. Do not use gum or patches that help you quit without your doctor's approval. °· Schedule and go to doctor visits as told. °· Nonpregnant women should have no more than 1 drink a day. Men should have no more than 2 drinks a day. Talk to your doctor about drinking alcohol. °· Stop illegal drug use. °· Stay current with shots (immunizations). °· Manage your health conditions as told by your  doctor. °· Learn to manage your stress. °· Rest when you are tired. °· If it is really hot outside: °? Avoid intense activities. °? Use air conditioning or fans, or get in a cooler place. °? Avoid caffeine and alcohol. °? Wear loose-fitting, lightweight, and light-colored clothing. °· If it is really cold outside: °? Avoid intense activities. °? Layer your clothing. °? Wear mittens or gloves, a hat, and a scarf when going outside. °? Avoid alcohol. °· Learn about heart failure and get support as needed. °· Get help to maintain or improve your quality of life and your ability to care for yourself as needed. °Contact a doctor if: °· You gain weight quickly. °· You are more short of breath than usual. °· You cannot do your normal activities. °· You tire easily. °· You cough more than normal, especially with activity. °· You have any or more puffiness (swelling) in areas such as your hands, feet, ankles, or belly (abdomen). °· You cannot sleep because it is hard to breathe. °· You feel like your heart is beating fast (palpitations). °· You get dizzy or light-headed when you stand up. °Get help right away if: °· You have trouble breathing. °· There is a change in mental status, such as becoming less alert or not being able to focus. °· You have chest pain or discomfort. °· You faint. °This information is not intended to replace advice given to you by your health care provider. Make sure you   discuss any questions you have with your health care provider. °Document Released: 03/06/2008 Document Revised: 11/03/2015 Document Reviewed: 07/14/2012 °[employer]vier Interactive Patient Education © 2017 [employer] ° °

## 2017-07-03 NOTE — Discharge Summary (Signed)
Physician Discharge Summary  [name] [name] ZOX:[id number] DOB: [birth year] DOA: 07/01/2017  PCP: [name] Spencer, FNP  Admit date: 07/01/2017 Discharge date: 07/03/2017  Time spent: 45 minutes  Recommendations for Outpatient Follow-up:  -To be discharged home today. -Advise follow-up with primary care provider within 1-2 weeks. -Outpatient Lasix dose has been increased.  Discharge Diagnoses:  Active Problems:   Arteriosclerotic cardiovascular disease (ASCVD)   Type II diabetes mellitus with peripheral autonomic neuropathy (HCC)   Acute heart failure (HCC)   Acute on chronic combined systolic and diastolic CHF (congestive heart failure) (HCC)   Pleural effusion   Palliative care encounter   Goals of care, counseling/discussion   Discharge Condition: Stable and improved  Filed Weights   07/01/17 2033 07/02/17 0253  Weight: 87.5 kg (193 lb) 86.6 kg (190 lb 14.7 oz)    History of present illness:  As per Dr. Melynda Ripple in 1/22: [name] is a 84 y.o. female  with significant pmhx of COPD, DM, CHF and CVD. Pt state she had choking and sob sensation this evening. She state she has been taking all of her meds. Has been coughing up white sputum. Pt has no hx of thoracentesis. No fevers.   ED Course: Diuresis started in ED. CXR shows CHF. Hospitalist consulted for admit.    Hospital Course:   Acute on chronic systolic and diastolic CHF -At time of discharge she is euvolemic (no crackles on lung auscultation and no LE edema), there is a documented 1.5 L overall negative balance since admission. -Her outpatient Lasix dose has been increased from 40 mg of Lasix daily to twice daily. -Echo from June 28, 2017 shows an ejection fraction of 35-40% with diffuse hypokinesis and grade 1 diastolic dysfunction.  COPD -Stable on room air -Continue bronchodilators  Pleural Effusion -R>L -07/02/17 thoracocentesis--900cc -Appears transudative and likely due to CHF.  Essential  hypertension -Continue lisinopril and metoprolol succinate -avoiding CCB due to depressed EF  Diabetes mellitus type 2 -Fair control, continue to monitor as an outpatient.  Hypothyroidism -Continue Synthroid  Hyperlipidemia -Continue statin  Tinea cruris -Discontinue fluconazole -Overall improved  Depression/anxiety -Continue home dose of  Zoloft and alprazolam      Procedures:  None  Consultations:  None  Discharge Instructions  Discharge Instructions    Diet - low sodium heart healthy   Complete by:  As directed    Increase activity slowly   Complete by:  As directed      Allergies as of 07/03/2017      Reactions   Penicillins    Has patient had a PCN reaction causing immediate rash, facial/tongue/throat swelling, SOB or lightheadedness with hypotension: Unknown Has patient had a PCN reaction causing severe rash involving mucus membranes or skin necrosis: Unknown Has patient had a PCN reaction that required hospitalization: Unknown Has patient had a PCN reaction occurring within the last 10 years: No If all of the above answers are "NO", then may proceed with Cephalosporin use.      Medication List    TAKE these medications   ALPRAZolam 0.5 MG tablet Commonly known as:  XANAX Take 1 tablet (0.5 mg total) by mouth at bedtime as needed for anxiety.   aspirin EC 81 MG tablet Take 81 mg by mouth daily. Reported on 12/14/2015   budesonide-formoterol 80-4.5 MCG/ACT inhaler Commonly known as:  SYMBICORT Inhale 2 puffs into the lungs 2 (two) times daily.   clotrimazole 1 % cream Commonly known as:  LOTRIMIN Apply  to affected right groin area twice daily   cyclobenzaprine 10 MG tablet Commonly known as:  FLEXERIL Take 1 tablet by mouth 3 (three) times daily as needed f or muscle spasms.   fluconazole 100 MG tablet Commonly known as:  DIFLUCAN Take 1 tablet (100 mg total) by mouth daily for 4 days.   furosemide 40 MG tablet Commonly known as:   LASIX Take 1 tablet (40 mg total) by mouth 2 (two) times daily. What changed:    when to take this  additional instructions   gabapentin 300 MG capsule Commonly known as:  NEURONTIN TAKE (1) CAPSULE THREE TIMES DAILY.   glimepiride 4 MG tablet Commonly known as:  AMARYL TAKE (1) TABLET DAILY BE- FORE BREAKFAST.   HYDROcodone-acetaminophen 5-325 MG tablet Commonly known as:  NORCO/VICODIN Take 1 tablet by mouth every 6 (six) hours as needed for moderate pain.   insulin NPH Human 100 UNIT/ML injection Commonly known as:  HUMULIN N Inject 0.08 mLs (8 Units total) into the skin 2 (two) times daily before a meal.   levothyroxine 200 MCG tablet Commonly known as:  SYNTHROID, LEVOTHROID TAKE (1) TABLET DAILY BE- FORE BREAKFAST.   lisinopril 10 MG tablet Commonly known as:  PRINIVIL,ZESTRIL TAKE 1 TABLET DAILY   meloxicam 7.5 MG tablet Commonly known as:  MOBIC Take 1 tablet (7.5 mg total) by mouth daily as needed for pain.   metFORMIN 1000 MG tablet Commonly known as:  GLUCOPHAGE TAKE (1) TABLET TWICE A DAY WITH MEALS (BREAKFAST AND SUPPER) FOR DIAB ETES   metoprolol succinate 25 MG 24 hr tablet Commonly known as:  TOPROL-XL Take 1 tablet (25 mg total) by mouth daily. Take with or immediately following a meal. What changed:    how much to take  additional instructions   nitroGLYCERIN 0.4 MG/SPRAY spray Commonly known as:  NITROLINGUAL Place 1 spray under the tongue every 5 (five) minutes as needed.   omeprazole 20 MG capsule Commonly known as:  PRILOSEC Take 20 mg by mouth daily.   Potassium Chloride ER 20 MEQ Tbcr Take 20 mEq by mouth daily.   pravastatin 80 MG tablet Commonly known as:  PRAVACHOL TAKE 1 TABLET ONCE DAILY FOR CHOLESTEROL   PROAIR HFA 108 (90 Base) MCG/ACT inhaler Generic drug:  albuterol 2 PUFFS EVERY 6 HOURS AS NEEDED FOR WHEEZING   sertraline 50 MG tablet Commonly known as:  ZOLOFT TAKE 1 TABLET DAILY      Allergies  Allergen  Reactions  . Penicillins     Has patient had a PCN reaction causing immediate rash, facial/tongue/throat swelling, SOB or lightheadedness with hypotension: Unknown Has patient had a PCN reaction causing severe rash involving mucus membranes or skin necrosis: Unknown Has patient had a PCN reaction that required hospitalization: Unknown Has patient had a PCN reaction occurring within the last 10 years: No If all of the above answers are "NO", then may proceed with Cephalosporin use.    Follow-up Information    [name] Spencer, FNP. Schedule an appointment as soon as possible for a visit in 2 week(s).   Specialty:  Family Medicine Contact information: [address] [place] [address] [phone]        [facility] Follow up.   Why:  RN, PT, Social work Tour manager: [address] [place] [address] [phone]           The results of significant diagnostics from this hospitalization (including imaging, microbiology, ancillary and laboratory)  are listed below for reference.    Significant Diagnostic Studies: Dg Chest 1 View  Result Date: 07/02/2017 CLINICAL DATA:  Post RIGHT thoracentesis EXAM: CHEST 1 VIEW COMPARISON:  07/01/2017 FINDINGS: Expiratory technique. LEFT subclavian pacemaker leads project over RIGHT atrium and RIGHT ventricle unchanged. Enlargement of cardiac silhouette with slight pulmonary vascular congestion. Atherosclerotic calcifications aorta. Bibasilar atelectasis. Probable small subpulmonic LEFT pleural effusion with significantly decreased RIGHT pleural effusion post thoracentesis. No pneumothorax or acute infiltrate. Bones demineralized. IMPRESSION: No pneumothorax following RIGHT thoracentesis. Small bibasilar pleural effusions and basilar atelectasis greater on LEFT. Electronically Signed   By: Ulyses SouthwardMark  Boles M.D.   On: 07/02/2017 12:27   Dg Chest 2 View  Result Date: 07/01/2017 CLINICAL DATA:  Dyspnea and  cough today. EXAM: CHEST  2 VIEW COMPARISON:  06/27/2017 FINDINGS: Heart and mediastinal contours are stable with aortic atherosclerosis. No aneurysm. Moderate right and trace left effusions are stable. Right atrial and right ventricular leads are noted with left-sided pacemaker apparatus projecting over the left upper lobe. Mild interstitial edema is stable. No acute osseous abnormality. Oste[facility] of the [facility] and glenohumeral joints bilaterally. IMPRESSION: 1. Chronic stable mild interstitial edema with moderate right and trace left pleural effusions. 2. Aortic atherosclerosis. Electronically Signed   By: Tollie Ethavid  Kwon M.D.   On: 07/01/2017 22:48   Dg Chest 2 View  Result Date: 06/28/2017 CLINICAL DATA:  84 y/o F; shortness of breath, cough, and mid lower chest pain for 2 days. EXAM: CHEST  2 VIEW COMPARISON:  03/14/2016 chest radiograph FINDINGS: Stable cardiac silhouette given projection and technique. Aortic atherosclerosis with calcification. Two lead pacemaker. Moderate right and trace left pleural effusions. Reticular opacities of the lungs. No pneumothorax. No acute osseous abnormality is evident. IMPRESSION: Moderate right and trace left pleural effusions, right basilar opacity may represent associated atelectasis or pneumonia. Reticular opacities of lungs, probably interstitial edema. Electronically Signed   By: Mitzi HansenLance  Furusawa-Stratton M.D.   On: 06/28/2017 00:45   Ct Head Wo Contrast  Result Date: 07/03/2017 CLINICAL DATA:  Initial evaluation for acute altered mental status, difficulty waking of. EXAM: CT HEAD WITHOUT CONTRAST TECHNIQUE: Contiguous axial images were obtained from the base of the skull through the vertex without intravenous contrast. COMPARISON:  Prior CT from 12/10/2009. FINDINGS: Brain: Generalized age appropriate cerebral atrophy. Mild chronic microvascular ischemic changes present within the supratentorial cerebral white matter. No acute intracranial hemorrhage. No evidence  for acute large vessel territory infarct. No mass lesion, midline shift or mass effect. No hydrocephalus. No extra-axial fluid collection. Vascular: No hyperdense vessel. Scattered vascular calcifications noted within the carotid siphons. Skull: Scalp soft tissues and calvarium within normal limits. Sinuses/Orbits: Globes and orbital soft tissues normal. Paranasal sinuses and mastoid air cells are clear. Other: None. IMPRESSION: 1. No acute intracranial abnormality. 2. Mild chronic microvascular ischemic disease. Electronically Signed   By: Rise MuBenjamin  McClintock M.D.   On: 07/03/2017 01:06   [place] Thoracentesis Asp Pleural Space W/img Guide  Result Date: 07/02/2017 INDICATION: RIGHT pleural effusion EXAM: ULTRASOUND GUIDED DIAGNOSTIC AND THERAPEUTIC RIGHT THORACENTESIS MEDICATIONS: None. COMPLICATIONS: None immediate. PROCEDURE: Procedure, benefits, and risks of procedure were discussed with patient. Written informed consent for procedure was obtained. Time out protocol followed. Pleural effusion localized by ultrasound at the posterior RIGHT hemithorax. Skin prepped and draped in usual sterile fashion. Skin and soft tissues anesthetized with 10 mL of 1% lidocaine. 8 French thoracentesis catheter placed into the RIGHT pleural space. 900 mL of amber colored RIGHT pleural fluid aspirated by syringe  pump. Procedure tolerated well by patient without immediate complication. FINDINGS: A total of approximately 900 mL of RIGHT pleural fluid was removed. Samples were sent to the laboratory as requested by the clinical team. IMPRESSION: Successful ultrasound guided RIGHT thoracentesis yielding 900 mL of pleural fluid. Electronically Signed   By: Ulyses Southward M.D.   On: 07/02/2017 12:24    Microbiology: Recent Results (from the past 240 hour(s))  MRSA PCR Screening     Status: Abnormal   Collection Time: 06/28/17  4:17 AM  Result Value Ref Range Status   MRSA by PCR POSITIVE (A) NEGATIVE Final    Comment: RESULT CALLED  TO, READ BACK BY AND VERIFIED WITH: MCDANIEL M. AT 1221P ON [id number] BY THOMPSON S.        The GeneXpert MRSA Assay (FDA approved for NASAL specimens only), is one component of a comprehensive MRSA colonization surveillance program. It is not intended to diagnose MRSA infection nor to guide or monitor treatment for MRSA infections.   Stat Gram stain     Status: None   Collection Time: 07/02/17 11:58 AM  Result Value Ref Range Status   Specimen Description PLEURAL FLUID  Final   Special Requests NONE  Final   Gram Stain   Final    CYTOSPIN SMEAR WBC PRESENT, PREDOMINANTLY MONONUCLEAR NO ORGANISMS SEEN   Report Status 07/02/2017 FINAL  Final  Culture, body fluid-bottle     Status: None (Preliminary result)   Collection Time: 07/02/17 11:58 AM  Result Value Ref Range Status   Specimen Description PLEURAL  Final   Special Requests BOTTLES DRAWN AEROBIC AND ANAEROBIC 10CC  Final   Culture NO GROWTH < 24 HOURS  Final   Report Status PENDING  Incomplete     Labs: Basic Metabolic Panel: Recent Labs  Lab 06/28/17 0111 06/28/17 0349 06/29/17 0714 07/01/17 2201 07/03/17 0617  NA 136  --  139 134* 135  K 3.6  --  3.7 4.6 3.9  CL 100*  --  101 98* 96*  CO2 27  --  29 21* 28  GLUCOSE 159*  --  108* 126* 146*  BUN 15  --  14 14 20   CREATININE 0.77  --  0.74 0.76 0.86  CALCIUM 8.3*  --  8.4* 9.7 9.2  MG  --  0.9* 1.4*  --  1.3*  PHOS  --  3.1  --   --   --    Liver Function Tests: Recent Labs  Lab 06/28/17 0349 07/01/17 2201  AST 32 35  ALT 19 20  ALKPHOS 58 69  BILITOT 0.8 0.9  PROT 6.0* 7.7  ALBUMIN 3.2* 4.2   No results for input(s): LIPASE, AMYLASE in the last 168 hours. No results for input(s): AMMONIA in the last 168 hours. CBC: Recent Labs  Lab 06/28/17 0111 07/01/17 2254 07/03/17 0617  WBC 6.5 8.2 5.7  NEUTROABS 4.3 5.1  --   HGB 11.5* 12.9 11.8*  HCT 35.4* 39.4 36.2  MCV 89.6 89.3 89.6  PLT 194 126* 222   Cardiac Enzymes: Recent Labs  Lab  06/28/17 0111 06/28/17 0349 06/28/17 0950 06/28/17 1540 06/28/17 2108  TROPONINI 0.04* 0.05* 0.04* 0.04* 0.03*   BNP: BNP (last 3 results) Recent Labs    06/28/17 0111 07/01/17 2254  BNP 1,485.0* 1,641.0*    ProBNP (last 3 results) No results for input(s): PROBNP in the last 8760 hours.  CBG: Recent Labs  Lab 07/02/17 1705 07/02/17 2101 07/02/17 2246 07/03/17 0721 07/03/17 1110  GLUCAP 123* 110* 130* 132* 178*       Signed:  Chaya Jan  [facility] Pager: [phone] 07/03/2017, 6:44 PM

## 2017-07-03 NOTE — Evaluation (Signed)
Physical Therapy Evaluation Patient Details Name: [name] [name] MRN: [id number] DOB: 07-05-1932 Today's Date: 07/03/2017   History of Present Illness  [name] [name] is a 84 y.o. female  with significant pmhx of COPD, DM, CHF and CVD. Pt state she had choking and sob sensation this evening. She state she has been taking all of her meds. Has been coughing up white sputum. Pt has no hx of thoracentesis. No fevers.     Clinical Impression  Patient functioning near baseline for functional mobility and gait and states she has help at night and her daughter can check on her doing the day.  PLAN:  Discharge patient from physical therapy to care of nursing secondary to patient discharging home today.      Follow Up Recommendations Home health PT;Supervision - Intermittent    Equipment Recommendations  None recommended by PT    Recommendations for Other Services       Precautions / Restrictions Precautions Precautions: Fall Restrictions Weight Bearing Restrictions: No      Mobility  Bed Mobility Overal bed mobility: Modified Independent                Transfers Overall transfer level: Modified independent   Transfers: Sit to/from Stand Sit to Stand: Modified independent (Device/Increase time)            Ambulation/Gait Ambulation/Gait assistance: Supervision Ambulation Distance (Feet): 50 Feet Assistive device: None Gait Pattern/deviations: Decreased step length - right;Decreased step length - left;Decreased stride length   Gait velocity interpretation: Below normal speed for age/gender General Gait Details: demonstrates slow slightly labored cadence with frequent leaning on siderails for support in hallway  Stairs            Wheelchair Mobility    Modified Rankin (Stroke Patients Only)       Balance Overall balance assessment: No apparent balance deficits (not formally assessed) Sitting-balance support: No upper extremity supported;Feet  supported Sitting balance-Leahy Scale: Good     Standing balance support: No upper extremity supported;During functional activity Standing balance-Leahy Scale: Fair                               Pertinent Vitals/Pain Pain Assessment: No/denies pain    Home Living Family/patient expects to be discharged to:: Private residence Living Arrangements: Alone Available Help at Discharge: Neighbor;Friend(s);Family(states her daughter checks on her daily) Type of Home: House Home Access: Ramped entrance     Home Layout: One level Home Equipment: [place] - single point;Bedside commode;Shower seat Additional Comments: patient stated she had RW and W/C on previous admission, but denied having RW and W/C when asked today    Prior Function Level of Independence: Independent with assistive device(s)         Comments: leans on furniture for support or uses SPC for longer distances     Hand Dominance        Extremity/Trunk Assessment   Upper Extremity Assessment Upper Extremity Assessment: Overall WFL for tasks assessed    Lower Extremity Assessment Lower Extremity Assessment: Overall WFL for tasks assessed    Cervical / Trunk Assessment Cervical / Trunk Assessment: Normal  Communication   Communication: No difficulties  Cognition Arousal/Alertness: Awake/alert Behavior During Therapy: WFL for tasks assessed/performed Overall Cognitive Status: Within Functional Limits for tasks assessed  General Comments      Exercises     Assessment/Plan    PT Assessment All further PT needs can be met in the next venue of care  PT Problem List Decreased activity tolerance;Decreased mobility;Decreased balance       PT Treatment Interventions      PT Goals (Current goals can be found in the Care Plan section)  Acute Rehab PT Goals Patient Stated Goal: return home with neighbors and friend to assist PT Goal  Formulation: With patient Time For Goal Achievement: 07-20-17 Potential to Achieve Goals: Good    Frequency     Barriers to discharge        Co-evaluation               AM-PAC PT "6 Clicks" Daily Activity  Outcome Measure Difficulty turning over in bed (including adjusting bedclothes, sheets and blankets)?: None Difficulty moving from lying on back to sitting on the side of the bed? : None Difficulty sitting down on and standing up from a chair with arms (e.g., wheelchair, bedside commode, etc,.)?: None Help needed moving to and from a bed to chair (including a wheelchair)?: None Help needed walking in hospital room?: A Little Help needed climbing 3-5 steps with a railing? : A Little 6 Click Score: 22    End of Session   Activity Tolerance: Patient tolerated treatment well Patient left: in bed;with call bell/phone within reach(seated at bedside) Nurse Communication: Mobility status PT Visit Diagnosis: Unsteadiness on feet (R26.81);Other abnormalities of gait and mobility (R26.89);Muscle weakness (generalized) (M62.81)    Time: 3709-6438 PT Time Calculation (min) (ACUTE ONLY): 21 min   Charges:   PT Evaluation $PT Eval Moderate Complexity: 1 Mod PT Treatments $Therapeutic Activity: 8-22 mins   PT G Codes:        12:05 PM, 2017/07/20 Lonell Grandchild, MPT Physical Therapist with [facility] [phone] office [phone] mobile phone

## 2017-07-03 NOTE — Progress Notes (Signed)
Patient's IV removed, patient tolerated well, 2x2 gauze and paper tape applied to site, reviewed AVS with patient and RX given to patient for Lasix, patient verbalized understanding of all discharge instructions. Patient taken taken down to lobby via wheelchair, and transported home by daughter.

## 2017-07-03 NOTE — Progress Notes (Signed)
At approximately 1920, patient was alert and asking for something to eat. This RN entered patient's room at 2208 while patient sleeping. This RN attempted to do assessment and given meds. With touch, patient would open eyes and look at this RN, but not respond verbally. Patient would then close eyes again. This RN had to produce continual stimuli and ask questions several times in order to get some answers to questions, but not all. Patient eventually stated name. After some time, patient eventually stated month and day of birthdate, but not year. Pupils equally round and reactive to light. Hand grips equal. Patient slow to respond, and would sometimes just smile at this RN. [name] [name], Consulting civil engineerCharge RN notified, and assisted with assessment. Patient eventually stated that she was at home, then stated school. [name] BattenKim, MD notified and came to assess patient. Patient slowly became more alert and more oriented. Patient A&O to person and place at this time. Patient states wrong month and year [id number]. Patient getting CT head done. U/A ordered. Will continue to monitor.

## 2017-07-04 ENCOUNTER — Ambulatory Visit: Payer: Medicare Other | Admitting: Family

## 2017-07-04 DIAGNOSIS — J449 Chronic obstructive pulmonary disease, unspecified: Secondary | ICD-10-CM | POA: Diagnosis not present

## 2017-07-04 DIAGNOSIS — E785 Hyperlipidemia, unspecified: Secondary | ICD-10-CM | POA: Diagnosis not present

## 2017-07-04 DIAGNOSIS — R1310 Dysphagia, unspecified: Secondary | ICD-10-CM | POA: Diagnosis not present

## 2017-07-04 DIAGNOSIS — I2510 Atherosclerotic heart disease of native coronary artery without angina pectoris: Secondary | ICD-10-CM | POA: Diagnosis not present

## 2017-07-04 DIAGNOSIS — J90 Pleural effusion, not elsewhere classified: Secondary | ICD-10-CM | POA: Diagnosis not present

## 2017-07-04 DIAGNOSIS — K219 Gastro-esophageal reflux disease without esophagitis: Secondary | ICD-10-CM | POA: Diagnosis not present

## 2017-07-04 DIAGNOSIS — I5033 Acute on chronic diastolic (congestive) heart failure: Secondary | ICD-10-CM | POA: Diagnosis not present

## 2017-07-04 DIAGNOSIS — B356 Tinea cruris: Secondary | ICD-10-CM | POA: Diagnosis not present

## 2017-07-04 DIAGNOSIS — E119 Type 2 diabetes mellitus without complications: Secondary | ICD-10-CM | POA: Diagnosis not present

## 2017-07-04 DIAGNOSIS — I110 Hypertensive heart disease with heart failure: Secondary | ICD-10-CM | POA: Diagnosis not present

## 2017-07-05 DIAGNOSIS — E785 Hyperlipidemia, unspecified: Secondary | ICD-10-CM | POA: Diagnosis not present

## 2017-07-05 DIAGNOSIS — I2510 Atherosclerotic heart disease of native coronary artery without angina pectoris: Secondary | ICD-10-CM | POA: Diagnosis not present

## 2017-07-05 DIAGNOSIS — I5033 Acute on chronic diastolic (congestive) heart failure: Secondary | ICD-10-CM | POA: Diagnosis not present

## 2017-07-05 DIAGNOSIS — J449 Chronic obstructive pulmonary disease, unspecified: Secondary | ICD-10-CM | POA: Diagnosis not present

## 2017-07-05 DIAGNOSIS — E119 Type 2 diabetes mellitus without complications: Secondary | ICD-10-CM | POA: Diagnosis not present

## 2017-07-05 DIAGNOSIS — J90 Pleural effusion, not elsewhere classified: Secondary | ICD-10-CM | POA: Diagnosis not present

## 2017-07-05 DIAGNOSIS — I110 Hypertensive heart disease with heart failure: Secondary | ICD-10-CM | POA: Diagnosis not present

## 2017-07-05 DIAGNOSIS — B356 Tinea cruris: Secondary | ICD-10-CM | POA: Diagnosis not present

## 2017-07-05 DIAGNOSIS — K219 Gastro-esophageal reflux disease without esophagitis: Secondary | ICD-10-CM | POA: Diagnosis not present

## 2017-07-05 DIAGNOSIS — R1310 Dysphagia, unspecified: Secondary | ICD-10-CM | POA: Diagnosis not present

## 2017-07-07 LAB — CULTURE, BODY FLUID W GRAM STAIN -BOTTLE: Culture: NO GROWTH

## 2017-07-08 DIAGNOSIS — I5033 Acute on chronic diastolic (congestive) heart failure: Secondary | ICD-10-CM | POA: Diagnosis not present

## 2017-07-08 DIAGNOSIS — I2510 Atherosclerotic heart disease of native coronary artery without angina pectoris: Secondary | ICD-10-CM | POA: Diagnosis not present

## 2017-07-08 DIAGNOSIS — E119 Type 2 diabetes mellitus without complications: Secondary | ICD-10-CM | POA: Diagnosis not present

## 2017-07-08 DIAGNOSIS — K219 Gastro-esophageal reflux disease without esophagitis: Secondary | ICD-10-CM | POA: Diagnosis not present

## 2017-07-08 DIAGNOSIS — E785 Hyperlipidemia, unspecified: Secondary | ICD-10-CM | POA: Diagnosis not present

## 2017-07-08 DIAGNOSIS — J90 Pleural effusion, not elsewhere classified: Secondary | ICD-10-CM | POA: Diagnosis not present

## 2017-07-08 DIAGNOSIS — I110 Hypertensive heart disease with heart failure: Secondary | ICD-10-CM | POA: Diagnosis not present

## 2017-07-08 DIAGNOSIS — R1310 Dysphagia, unspecified: Secondary | ICD-10-CM | POA: Diagnosis not present

## 2017-07-08 DIAGNOSIS — J449 Chronic obstructive pulmonary disease, unspecified: Secondary | ICD-10-CM | POA: Diagnosis not present

## 2017-07-08 DIAGNOSIS — B356 Tinea cruris: Secondary | ICD-10-CM | POA: Diagnosis not present

## 2017-07-08 NOTE — Progress Notes (Signed)
Cardiology Office Note    Date:  07/09/2017   ID:  [name] [name], DOB 1932/09/30, MRN [id number]  PCP:  Junie Spencer, FNP  Cardiologist: Dr. Ladona Ridgel; Previously followed by Dr. Antoine Poche (last visit in 2015)   Chief Complaint  Patient presents with  . Hospitalization Follow-up    History of Present Illness:    [name] [name] is a 84 y.o. female with past medical history of CAD (cath in 1999 showing moderate multivessel CAD with medical management pursued, low-risk NST in 2010, partially reversible defect on NST in 08/2012), chronic diastolic CHF, SSS (s/p Medtronic PPM placement in 2003), COPD, Type 2 DM, HTN, and HLD who presents to the office today for hospital follow-up.   She was last examined by Dr. Ladona Ridgel in 11/2016 and denied any recent chest pain, palpitations, or dyspnea at that time. She was continued on her current medication regimen.   In the interim, she was admitted to [facility] from 1/17 - 06/29/2017 for evaluation of worsening dyspnea and epigastric pain. BNP was elevated to 1485 and she was diuresed with IV Lasix. A repeat echocardiogram was obtained and showed a reduced EF of 35-40% with diffuse HK, mild MR, and mild TR. No recent echos are available for comparison but EF was at 65% in 08/2012 at the time of her NST. She was discharged on PO Lasix 40mg  daily along with being continued on Toprol-XL and Lisinopril. She did report a 70 lb unintentional weight loss in the past 6 months with outpatient GI evaluation recommended.   Presented back to [facility] on 1/21 with recurrent abdominal pain and dyspnea. CXR showed a moderate right pleural effusion and she underwent a thoracentesis on 1/22 with 900 mL of fluid removed. Her outpatient Lasix dosing was increased from 40mg  daily to 40mg  BID with a discharge weight of 190 lbs on 07/03/2017.    In talking with the patient today, she reports weight has been variable on her home scales between 180 - 185 lbs. She is at 193 lbs on  the office scales today. She denies any recurrent dyspnea, orthopnea, or lower extremity edema since hospital discharge. Has noted occasional episodes of chest pain which occurs when she is up walking or sitting down, lasting for 5 - 30 minute intervals. Says these symptoms led to her second hospitalization this month.   She has Home Health coming to visit twice per week and vitals are checked at that time. She is unsure of the exact numbers but says they are "good". BP is initially at 164/90 during today's visit, improved to 148/86 on recheck. Reports being under increased stress as she is currently living by herself while her husband is at rehab.   Past Medical History:  Diagnosis Date  . ANXIETY   . Arteriosclerotic cardiovascular disease (ASCVD)    Cath in 9/99-70% mid LAD with diffuse distal disease, 70% T1, 60% mid circumflex, 50% mid RCA, normal ejection fraction; negative stress nuclear in 07/2008  . Carpal tunnel syndrome   . Cerebrovascular disease    carotid bruits; no focal disease in 1999, 2002 and 2006  . Closed left fibular fracture 11/30/11  . COPD   . Diabetes mellitus    Insulin treatment  . Diastolic CHF, chronic (HCC)    Normal EF  . Gastroesophageal reflux disease    With hiatal hernia; irritable bowel syndrome; H. pylori-treated; Colonoscopy 2008: non-specific colitis, IH, Diverticulosis, Rectal ulcer secondary to ASA  . Hyperlipidemia   . Hypertension   .  Hypothyroidism   . OSTEOARTHRITIS, KNEES, BILATERAL    Bilateral TKA  . Pacemaker   . Peripheral neuropathy   . Sinoatrial node dysfunction (HCC) 2003   Medtronic dual-chamber device  . Sleep apnea   . VITAMIN B12 DEFICIENCY     Past Surgical History:  Procedure Laterality Date  . ABDOMINAL HYSTERECTOMY    . BLADDER REPAIR    . CARDIAC PACEMAKER PLACEMENT  2003   Medtronic, dual-chamber  . CARPAL TUNNEL RELEASE    . CHOLECYSTECTOMY    . COLONOSCOPY  2008  . HEMORRHOID SURGERY    . NASAL FRACTURE  SURGERY    . THYROIDECTOMY  1980   Goiter  . TOTAL KNEE ARTHROPLASTY     Bilateral    Current Medications: Outpatient Medications Prior to Visit  Medication Sig Dispense Refill  . ALPRAZolam (XANAX) 0.5 MG tablet Take 1 tablet (0.5 mg total) by mouth at bedtime as needed for anxiety. 30 tablet 5  . aspirin EC 81 MG tablet Take 81 mg by mouth daily. Reported on 12/14/2015    . budesonide-formoterol (SYMBICORT) 80-4.5 MCG/ACT inhaler Inhale 2 puffs into the lungs 2 (two) times daily. 10.2 g 3  . clotrimazole (LOTRIMIN) 1 % cream Apply to affected right groin area twice daily 113 g 0  . cyclobenzaprine (FLEXERIL) 10 MG tablet Take 1 tablet by mouth 3 (three) times daily as needed f or muscle spasms. 30 tablet 2  . furosemide (LASIX) 40 MG tablet Take 1 tablet (40 mg total) by mouth 2 (two) times daily. 60 tablet 2  . gabapentin (NEURONTIN) 300 MG capsule TAKE (1) CAPSULE THREE TIMES DAILY. 90 capsule 0  . glimepiride (AMARYL) 4 MG tablet TAKE (1) TABLET DAILY BE- FORE BREAKFAST. 90 tablet 0  . HYDROcodone-acetaminophen (NORCO/VICODIN) 5-325 MG tablet Take 1 tablet by mouth every 6 (six) hours as needed for moderate pain. 60 tablet 0  . insulin NPH Human (HUMULIN N) 100 UNIT/ML injection Inject 0.08 mLs (8 Units total) into the skin 2 (two) times daily before a meal.    . levothyroxine (SYNTHROID, LEVOTHROID) 200 MCG tablet TAKE (1) TABLET DAILY BE- FORE BREAKFAST. 90 tablet 3  . lisinopril (PRINIVIL,ZESTRIL) 10 MG tablet TAKE 1 TABLET DAILY 90 tablet 1  . meloxicam (MOBIC) 7.5 MG tablet Take 1 tablet (7.5 mg total) by mouth daily as needed for pain. 15 tablet 0  . metFORMIN (GLUCOPHAGE) 1000 MG tablet TAKE (1) TABLET TWICE A DAY WITH MEALS (BREAKFAST AND SUPPER) FOR DIAB ETES 180 tablet 0  . metoprolol succinate (TOPROL-XL) 25 MG 24 hr tablet Take 1 tablet (25 mg total) by mouth daily. Take with or immediately following a meal. (Patient taking differently: Take 12.5 mg by mouth daily. Take with or  immediately following a meal.) 90 tablet 0  . omeprazole (PRILOSEC) 20 MG capsule Take 20 mg by mouth daily.    . potassium chloride 20 MEQ TBCR Take 20 mEq by mouth daily. 30 tablet 0  . pravastatin (PRAVACHOL) 80 MG tablet TAKE 1 TABLET ONCE DAILY FOR CHOLESTEROL 90 tablet 0  . PROAIR HFA 108 (90 Base) MCG/ACT inhaler 2 PUFFS EVERY 6 HOURS AS NEEDED FOR WHEEZING 25.5 g 1  . sertraline (ZOLOFT) 50 MG tablet TAKE 1 TABLET DAILY 90 tablet 0  . nitroGLYCERIN (NITROLINGUAL) 0.4 MG/SPRAY spray Place 1 spray under the tongue every 5 (five) minutes as needed. 12 g 2   No facility-administered medications prior to visit.      Allergies:   Penicillins  Social History   Socioeconomic History  . Marital status: Married    Spouse name: husband has dementia  . Number of children: None  . Years of education: 8  . Highest education level: None  Social Needs  . Financial resource strain: None  . Food insecurity - worry: None  . Food insecurity - inability: None  . Transportation needs - medical: None  . Transportation needs - non-medical: None  Occupational History  . Occupation: retired    Associate Professor: RETIRED  Tobacco Use  . Smoking status: Never Smoker  . Smokeless tobacco: Never Used  Substance and Sexual Activity  . Alcohol use: No    Alcohol/week: 0.0 oz  . Drug use: No  . Sexual activity: Not Currently  Other Topics Concern  . None  Social History Narrative  . None     Family History:  The patient's family history includes COPD in her father; Cancer in her brother, brother, daughter, sister, sister, sister, and son; Diabetes in her sister and son; Early death in her son; Heart attack (age of onset: 63) in her father; Heart disease in her son; Heart failure in her mother.   Review of Systems:   Please see the history of present illness.     General:  No chills, fever, night sweats or weight changes.  Cardiovascular:  No dyspnea on exertion, edema, orthopnea, palpitations,  paroxysmal nocturnal dyspnea. Positive for chest pain.  Dermatological: No rash, lesions/masses Respiratory: No cough, Positive for dyspnea (now improved).  Urologic: No hematuria, dysuria Abdominal:   No nausea, vomiting, diarrhea, bright red blood per rectum, melena, or hematemesis Neurologic:  No visual changes, wkns, changes in mental status. All other systems reviewed and are otherwise negative except as noted above.   Physical Exam:    VS:  BP (!) 148/86   Pulse 94   Ht 5\' 10"  (1.778 m)   Wt 193 lb (87.5 kg)   SpO2 97%   BMI 27.69 kg/m    General: Well developed, elderly Caucasian female appearing in no acute distress. Head: Normocephalic, atraumatic, sclera non-icteric, no xanthomas, nares are without discharge.  Neck: No carotid bruits. JVD not elevated.  Lungs: Respirations regular and unlabored, without wheezes or rales.  Heart: Regular rate and rhythm. No S3 or S4.  No murmur, no rubs, or gallops appreciated. Abdomen: Soft, non-tender, non-distended with normoactive bowel sounds. No hepatomegaly. No rebound/guarding. No obvious abdominal masses. Msk:  Strength and tone appear normal for age. No joint deformities or effusions. Extremities: No clubbing or cyanosis. Trace lower extremity edema.  Distal pedal pulses are 2+ bilaterally. Neuro: Alert and oriented X 3. Moves all extremities spontaneously. No focal deficits noted. Psych:  Responds to questions appropriately with a normal affect. Skin: No rashes or lesions noted  Wt Readings from Last 3 Encounters:  07/09/17 193 lb (87.5 kg)  07/02/17 190 lb 14.7 oz (86.6 kg)  06/29/17 194 lb 14.2 oz (88.4 kg)     Studies/Labs Reviewed:   EKG:  EKG is not ordered today.   Recent Labs: 05/14/2017: TSH 3.950 07/01/2017: ALT 20; B Natriuretic Peptide 1,641.0 07/03/2017: BUN 20; Creatinine, Ser 0.86; Hemoglobin 11.8; Magnesium 1.3; Platelets 222; Potassium 3.9; Sodium 135   Lipid Panel    Component Value Date/Time   CHOL  132 05/14/2017 1633   TRIG 102 05/14/2017 1633   TRIG 120 02/20/2013 1310   TRIG 101 12/11/2009   HDL 49 05/14/2017 1633   HDL 65 02/20/2013 1310   CHOLHDL 2.7  05/14/2017 1633   CHOLHDL 3 10/22/2011 1151   VLDL 31.8 10/22/2011 1151   LDLCALC 63 05/14/2017 1633   LDLCALC 69 02/20/2013 1310    Additional studies/ records that were reviewed today include:   Echocardiogram: 06/28/2017 Study Conclusions  - Left ventricle: The cavity size was normal. Wall thickness was   increased in a pattern of moderate LVH. Systolic function was   moderately reduced. The estimated ejection fraction was in the   range of 35% to 40%. Diffuse hypokinesis. Doppler parameters are   consistent with abnormal left ventricular relaxation (grade 1   diastolic dysfunction). Doppler parameters are consistent with   both elevated ventricular end-diastolic filling pressure and   elevated left atrial filling pressure. - Mitral valve: Moderately calcified posterior annulus. There was   mild regurgitation. - Left atrium: The atrium was moderately to severely dilated. - Right ventricle: Pacer wire or catheter noted in right ventricle. - Right atrium: The atrium was mildly dilated. Pacer wire or   catheter noted in right atrium. - Atrial septum: No defect or patent foramen ovale was identified. - Tricuspid valve: There was mild regurgitation. - Pulmonary arteries: PA peak pressure: 42 mm Hg (S).  Assessment:    1. Chronic combined systolic and diastolic CHF (congestive heart failure) (HCC)   2. Chest pain, unspecified type   3. Coronary artery disease involving native coronary artery of native heart with angina pectoris (HCC)   4. Sinoatrial node dysfunction (HCC)   5. Essential hypertension   6. Hyperlipidemia LDL goal <70      Plan:   In order of problems listed above:  1. Chronic Combined Systolic CHF - the patient has experienced two hospitalizations within the past month for CHF exacerbations with  her recent echo showing a reduced EF of 35-40% with diffuse HK, mild MR, and mild TR. Required a thoracentesis on 1/22 with 900 mL of fluid removed.  - she reports breathing has been stable since hospital discharge. Weight variable between 180 - 185 lbs on her home scales. I have asked her to weigh daily and to take an extra Lasix tablet if weight increases by more than 3 lbs overnight. Will continue on current baseline dosing of 40mg  BID. Plan for a repeat BMET at her next office visit. Sodium and fluid restriction also reviewed.  - she remains on Toprol-XL and Lisinopril. Would further titrate her medication regimen at her next office visit pending BP response.   2. CAD/ Chest Pain - cath in 1999 showing moderate multivessel CAD with medical management pursued at that time. Most recent ischemic evaluation was a NST in 08/2012 which showed a partially reversible defect but overall low-risk. - recent echo showed a reduced EF of 35-40% (EF at 65% at the time of NST in 08/2012). She reports episodes of chest pain leading to her prior admission with cyclic cardiac enzymes being flat at 0.04. With her reduced EF and episodes of chest pain, will obtain a repeat Lexiscan Myoview for ischemic evaluation. Would not pursue an invasive evaluation unless a high-level of ischemia noted or symptoms persist.   3. SSS - s/p Medtronic PPM placement in 2003. - followed by Dr. Ladona Ridgelaylor. Most recent device check in 11/2016 showed normal function. Due for remote check.    4. HTN - BP is initially at 164/90 during today's visit, improved to 148/86 on recheck. Will have this continued to be checked by Home Health. I have asked the patient to record her readings when this is  checked and bring to her next visit.  - continue Toprol-XL 25mg  daily and Lisinopril 10mg  daily. If BP remains elevated, would further titrate Lisinopril to 20mg  daily.   5. HLD - FLP in 05/2017 showed total cholesterol of 132, HDL 49, and LDL 63. At  goal of LDL < 70. - remains on Pravastatin 80mg  daily.    Medication Adjustments/Labs and Tests Ordered: Current medicines are reviewed at length with the patient today.  Concerns regarding medicines are outlined above.  Medication changes, Labs and Tests ordered today are listed in the Patient Instructions below. Patient Instructions  Your physician recommends that you schedule a follow-up appointment in: 3-4 weeks with Randall An PA-C  IF you gain more then 3 pounds OVER NIGHT, you may take an EXTRA Lasix 40 mg  Your physician has requested that you have a lexiscan myoview. For further information please visit [email or web address] Please follow instruction sheet, as given.  No lab work ordered today  Thank you for choosing [facility] !         Signed, Ellsworth Lennox, PA-C  07/09/2017 4:32 PM    [facility] [address] [place], [place] [address] Phone: [phone]

## 2017-07-09 ENCOUNTER — Ambulatory Visit (INDEPENDENT_AMBULATORY_CARE_PROVIDER_SITE_OTHER): Payer: Medicare HMO | Admitting: Student

## 2017-07-09 ENCOUNTER — Encounter: Payer: Self-pay | Admitting: Student

## 2017-07-09 VITALS — BP 148/86 | HR 94 | Ht 70.0 in | Wt 193.0 lb

## 2017-07-09 DIAGNOSIS — E785 Hyperlipidemia, unspecified: Secondary | ICD-10-CM | POA: Diagnosis not present

## 2017-07-09 DIAGNOSIS — I5042 Chronic combined systolic (congestive) and diastolic (congestive) heart failure: Secondary | ICD-10-CM

## 2017-07-09 DIAGNOSIS — I10 Essential (primary) hypertension: Secondary | ICD-10-CM

## 2017-07-09 DIAGNOSIS — I495 Sick sinus syndrome: Secondary | ICD-10-CM

## 2017-07-09 DIAGNOSIS — I25119 Atherosclerotic heart disease of native coronary artery with unspecified angina pectoris: Secondary | ICD-10-CM

## 2017-07-09 DIAGNOSIS — R079 Chest pain, unspecified: Secondary | ICD-10-CM | POA: Diagnosis not present

## 2017-07-09 MED ORDER — NITROGLYCERIN 0.4 MG/SPRAY TL SOLN: 1.0000 | 2 refills | Status: DC | PRN

## 2017-07-09 NOTE — Patient Instructions (Addendum)
Your physician recommends that you schedule a follow-up appointment in: 3-4 weeks with Randall AnBrittany Strader PA-C   IF you gain more then 3 pounds OVER NIGHT, you may take an EXTRA Lasix 40 mg   Your physician has requested that you have a lexiscan myoview. For further information please visit [email or web address]. Please follow instruction sheet, as given.    No lab work ordered today     Thank you for choosing [facility] !

## 2017-07-10 DIAGNOSIS — B356 Tinea cruris: Secondary | ICD-10-CM | POA: Diagnosis not present

## 2017-07-10 DIAGNOSIS — E785 Hyperlipidemia, unspecified: Secondary | ICD-10-CM | POA: Diagnosis not present

## 2017-07-10 DIAGNOSIS — I5033 Acute on chronic diastolic (congestive) heart failure: Secondary | ICD-10-CM | POA: Diagnosis not present

## 2017-07-10 DIAGNOSIS — J449 Chronic obstructive pulmonary disease, unspecified: Secondary | ICD-10-CM | POA: Diagnosis not present

## 2017-07-10 DIAGNOSIS — R1310 Dysphagia, unspecified: Secondary | ICD-10-CM | POA: Diagnosis not present

## 2017-07-10 DIAGNOSIS — I110 Hypertensive heart disease with heart failure: Secondary | ICD-10-CM | POA: Diagnosis not present

## 2017-07-10 DIAGNOSIS — J90 Pleural effusion, not elsewhere classified: Secondary | ICD-10-CM | POA: Diagnosis not present

## 2017-07-10 DIAGNOSIS — E119 Type 2 diabetes mellitus without complications: Secondary | ICD-10-CM | POA: Diagnosis not present

## 2017-07-10 DIAGNOSIS — K219 Gastro-esophageal reflux disease without esophagitis: Secondary | ICD-10-CM | POA: Diagnosis not present

## 2017-07-10 DIAGNOSIS — I2510 Atherosclerotic heart disease of native coronary artery without angina pectoris: Secondary | ICD-10-CM | POA: Diagnosis not present

## 2017-07-11 ENCOUNTER — Encounter: Payer: Self-pay | Admitting: Family

## 2017-07-11 ENCOUNTER — Ambulatory Visit (INDEPENDENT_AMBULATORY_CARE_PROVIDER_SITE_OTHER): Payer: Medicare HMO | Admitting: Family

## 2017-07-11 VITALS — BP 106/61 | HR 62 | Temp 95.2°F

## 2017-07-11 DIAGNOSIS — I2510 Atherosclerotic heart disease of native coronary artery without angina pectoris: Secondary | ICD-10-CM | POA: Diagnosis not present

## 2017-07-11 DIAGNOSIS — B356 Tinea cruris: Secondary | ICD-10-CM | POA: Diagnosis not present

## 2017-07-11 DIAGNOSIS — I5043 Acute on chronic combined systolic (congestive) and diastolic (congestive) heart failure: Secondary | ICD-10-CM

## 2017-07-11 DIAGNOSIS — Z09 Encounter for follow-up examination after completed treatment for conditions other than malignant neoplasm: Secondary | ICD-10-CM

## 2017-07-11 DIAGNOSIS — I5032 Chronic diastolic (congestive) heart failure: Secondary | ICD-10-CM | POA: Diagnosis not present

## 2017-07-11 DIAGNOSIS — I5033 Acute on chronic diastolic (congestive) heart failure: Secondary | ICD-10-CM | POA: Diagnosis not present

## 2017-07-11 DIAGNOSIS — J90 Pleural effusion, not elsewhere classified: Secondary | ICD-10-CM | POA: Diagnosis not present

## 2017-07-11 DIAGNOSIS — R1310 Dysphagia, unspecified: Secondary | ICD-10-CM | POA: Diagnosis not present

## 2017-07-11 DIAGNOSIS — J449 Chronic obstructive pulmonary disease, unspecified: Secondary | ICD-10-CM | POA: Diagnosis not present

## 2017-07-11 DIAGNOSIS — E1143 Type 2 diabetes mellitus with diabetic autonomic (poly)neuropathy: Secondary | ICD-10-CM

## 2017-07-11 DIAGNOSIS — E119 Type 2 diabetes mellitus without complications: Secondary | ICD-10-CM | POA: Diagnosis not present

## 2017-07-11 DIAGNOSIS — K219 Gastro-esophageal reflux disease without esophagitis: Secondary | ICD-10-CM | POA: Diagnosis not present

## 2017-07-11 DIAGNOSIS — I110 Hypertensive heart disease with heart failure: Secondary | ICD-10-CM | POA: Diagnosis not present

## 2017-07-11 DIAGNOSIS — E785 Hyperlipidemia, unspecified: Secondary | ICD-10-CM | POA: Diagnosis not present

## 2017-07-11 LAB — MICROSCOPIC EXAMINATION: RBC MICROSCOPIC, UA: NONE SEEN /HPF (ref 0–?)

## 2017-07-11 LAB — URINALYSIS, COMPLETE
Bilirubin, UA: NEGATIVE
GLUCOSE, UA: NEGATIVE
KETONES UA: NEGATIVE
Nitrite, UA: NEGATIVE
PROTEIN UA: NEGATIVE
RBC, UA: NEGATIVE
SPEC GRAV UA: 1.01 (ref 1.005–1.030)
Urobilinogen, Ur: 0.2 mg/dL (ref 0.2–1.0)
pH, UA: 7.5 (ref 5.0–7.5)

## 2017-07-11 MED ORDER — GLUCOSE BLOOD VI STRP: ORAL_STRIP | 12 refills | Status: DC

## 2017-07-11 NOTE — Progress Notes (Signed)
   Subjective:    Patient ID: [name] [name], female    DOB: May 29, 1933, 84 y.o.   MRN: [id number]  Pt presents to the office today for hospital follow up today. PT admitted to the hospital on 06/27/17-06/29/17 and 07/01/17 for CHF and was discharged on 07/03/17.   Pt has seen the Cardiologists 07/09/17. PT had a reduced EF of 35-40% on her Echo.  Congestive Heart Failure  Presents for follow-up visit. Pertinent negatives include no chest pain, chest pressure, edema, palpitations, shortness of breath or unexpected weight change. The symptoms have been stable.  Diabetes  She presents for her follow-up diabetic visit. She has type 2 diabetes mellitus. Her disease course has been stable. Associated symptoms include blurred vision. Pertinent negatives for diabetes include no chest pain and no foot paresthesias. Symptoms are stable. Her breakfast blood glucose range is generally 110-130 mg/dl.    [facility] notes reviewed.   Review of Systems  Constitutional: Negative for unexpected weight change.  Eyes: Positive for blurred vision.  Respiratory: Negative for shortness of breath.   Cardiovascular: Negative for chest pain and palpitations.  All other systems reviewed and are negative.      Objective:   Physical Exam  Constitutional: She is oriented to person, place, and time. She appears well-developed and well-nourished. No distress.  HENT:  Head: Normocephalic and atraumatic.  Right Ear: External ear normal.  Left Ear: External ear normal.  Nose: Nose normal.  Mouth/Throat: Oropharynx is clear and moist.  Eyes: Pupils are equal, round, and reactive to light.  Neck: Normal range of motion. Neck supple. No thyromegaly present.  Cardiovascular: Normal rate, regular rhythm, normal heart sounds and intact distal pulses.  No murmur heard. Pulmonary/Chest: Effort normal and breath sounds normal. No respiratory distress. She has no wheezes.  Abdominal: Soft. Bowel sounds are normal. She  exhibits no distension. There is no tenderness.  Musculoskeletal: Normal range of motion. She exhibits edema (trace BLE). She exhibits no tenderness.  Neurological: She is alert and oriented to person, place, and time. She has normal reflexes. No cranial nerve deficit.  Skin: Skin is warm and dry.  Psychiatric: She has a normal mood and affect. Her behavior is normal. Judgment and thought content normal.  Vitals reviewed.    BP 106/61   Pulse 62   Temp (!) 95.2 F (35.1 C) (Oral)      Assessment & Plan:  1. Type II diabetes mellitus with peripheral autonomic neuropathy (HCC) - glucose blood (GLUCOSE METER TEST) test strip; Use BID  Dispense: 100 each; Refill: 12 - CBC with Differential/Platelet - BMP8+EGFR  2. Chronic diastolic heart failure (HCC) - CBC with Differential/Platelet - BMP8+EGFR  3. Acute on chronic combined systolic and diastolic CHF (congestive heart failure) (HCC) - CBC with Differential/Platelet - BMP8+EGFR  4. Morbid obesity (HCC) - CBC with Differential/Platelet - BMP8+EGFR  5. Hospital discharge follow-up - CBC with Differential/Platelet - BMP8+EGFR - Urinalysis, Complete   Continue all meds Labs pending Health Maintenance reviewed Diet and exercise encouraged RTO 2 month for chronic follow up   Evelina Dun, FNP

## 2017-07-11 NOTE — Patient Instructions (Signed)
Heart Failure °Heart failure means your heart has trouble pumping blood. This makes it hard for your body to work well. Heart failure is usually a long-term (chronic) condition. You must take good care of yourself and follow your doctor's treatment plan. °Follow these instructions at home: °· Take your heart medicine as told by your doctor. °? Do not stop taking medicine unless your doctor tells you to. °? Do not skip any dose of medicine. °? Refill your medicines before they run out. °? Take other medicines only as told by your doctor or pharmacist. °· Stay active if told by your doctor. The elderly and people with severe heart failure should talk with a doctor about physical activity. °· Eat heart-healthy foods. Choose foods that are without trans fat and are low in saturated fat, cholesterol, and salt (sodium). This includes fresh or frozen fruits and vegetables, fish, lean meats, fat-free or low-fat dairy foods, whole grains, and high-fiber foods. Lentils and dried peas and beans (legumes) are also good choices. °· Limit salt if told by your doctor. °· Cook in a healthy way. Roast, grill, broil, bake, poach, steam, or stir-fry foods. °· Limit fluids as told by your doctor. °· Weigh yourself every morning. Do this after you pee (urinate) and before you eat breakfast. Write down your weight to give to your doctor. °· Take your blood pressure and write it down if your doctor tells you to. °· Ask your doctor how to check your pulse. Check your pulse as told. °· Lose weight if told by your doctor. °· Stop smoking or chewing tobacco. Do not use gum or patches that help you quit without your doctor's approval. °· Schedule and go to doctor visits as told. °· Nonpregnant women should have no more than 1 drink a day. Men should have no more than 2 drinks a day. Talk to your doctor about drinking alcohol. °· Stop illegal drug use. °· Stay current with shots (immunizations). °· Manage your health conditions as told by your  doctor. °· Learn to manage your stress. °· Rest when you are tired. °· If it is really hot outside: °? Avoid intense activities. °? Use air conditioning or fans, or get in a cooler place. °? Avoid caffeine and alcohol. °? Wear loose-fitting, lightweight, and light-colored clothing. °· If it is really cold outside: °? Avoid intense activities. °? Layer your clothing. °? Wear mittens or gloves, a hat, and a scarf when going outside. °? Avoid alcohol. °· Learn about heart failure and get support as needed. °· Get help to maintain or improve your quality of life and your ability to care for yourself as needed. °Contact a doctor if: °· You gain weight quickly. °· You are more short of breath than usual. °· You cannot do your normal activities. °· You tire easily. °· You cough more than normal, especially with activity. °· You have any or more puffiness (swelling) in areas such as your hands, feet, ankles, or belly (abdomen). °· You cannot sleep because it is hard to breathe. °· You feel like your heart is beating fast (palpitations). °· You get dizzy or light-headed when you stand up. °Get help right away if: °· You have trouble breathing. °· There is a change in mental status, such as becoming less alert or not being able to focus. °· You have chest pain or discomfort. °· You faint. °This information is not intended to replace advice given to you by your health care provider. Make sure you   discuss any questions you have with your health care provider. °Document Released: 03/06/2008 Document Revised: 11/03/2015 Document Reviewed: 07/14/2012 °Elsevier Interactive Patient Education © 2017 [employer] ° °

## 2017-07-12 LAB — CBC WITH DIFFERENTIAL/PLATELET
BASOS ABS: 0 10*3/uL (ref 0.0–0.2)
Basos: 0 %
EOS (ABSOLUTE): 0.2 10*3/uL (ref 0.0–0.4)
Eos: 2 %
Hematocrit: 39.2 % (ref 34.0–46.6)
Hemoglobin: 12.8 g/dL (ref 11.1–15.9)
Immature Grans (Abs): 0 10*3/uL (ref 0.0–0.1)
Immature Granulocytes: 0 %
LYMPHS ABS: 2.2 10*3/uL (ref 0.7–3.1)
Lymphs: 32 %
MCH: 29.4 pg (ref 26.6–33.0)
MCHC: 32.7 g/dL (ref 31.5–35.7)
MCV: 90 fL (ref 79–97)
MONOS ABS: 0.4 10*3/uL (ref 0.1–0.9)
Monocytes: 6 %
NEUTROS ABS: 4.2 10*3/uL (ref 1.4–7.0)
Neutrophils: 60 %
PLATELETS: 250 10*3/uL (ref 150–379)
RBC: 4.35 x10E6/uL (ref 3.77–5.28)
RDW: 14.2 % (ref 12.3–15.4)
WBC: 7 10*3/uL (ref 3.4–10.8)

## 2017-07-12 LAB — BMP8+EGFR
BUN / CREAT RATIO: 15 (ref 12–28)
BUN: 15 mg/dL (ref 8–27)
CHLORIDE: 100 mmol/L (ref 96–106)
CO2: 24 mmol/L (ref 20–29)
Calcium: 9.6 mg/dL (ref 8.7–10.3)
Creatinine, Ser: 0.99 mg/dL (ref 0.57–1.00)
GFR calc non Af Amer: 53 mL/min/{1.73_m2} — ABNORMAL LOW (ref >59)
GFR, EST AFRICAN AMERICAN: 61 mL/min/{1.73_m2} (ref >59)
GLUCOSE: 128 mg/dL — AB (ref 65–99)
Potassium: 4.9 mmol/L (ref 3.5–5.2)
SODIUM: 139 mmol/L (ref 134–144)

## 2017-07-17 DIAGNOSIS — J90 Pleural effusion, not elsewhere classified: Secondary | ICD-10-CM | POA: Diagnosis not present

## 2017-07-17 DIAGNOSIS — J449 Chronic obstructive pulmonary disease, unspecified: Secondary | ICD-10-CM | POA: Diagnosis not present

## 2017-07-17 DIAGNOSIS — I110 Hypertensive heart disease with heart failure: Secondary | ICD-10-CM | POA: Diagnosis not present

## 2017-07-17 DIAGNOSIS — E785 Hyperlipidemia, unspecified: Secondary | ICD-10-CM | POA: Diagnosis not present

## 2017-07-17 DIAGNOSIS — B356 Tinea cruris: Secondary | ICD-10-CM | POA: Diagnosis not present

## 2017-07-17 DIAGNOSIS — R1310 Dysphagia, unspecified: Secondary | ICD-10-CM | POA: Diagnosis not present

## 2017-07-17 DIAGNOSIS — K219 Gastro-esophageal reflux disease without esophagitis: Secondary | ICD-10-CM | POA: Diagnosis not present

## 2017-07-17 DIAGNOSIS — I5033 Acute on chronic diastolic (congestive) heart failure: Secondary | ICD-10-CM | POA: Diagnosis not present

## 2017-07-17 DIAGNOSIS — I2510 Atherosclerotic heart disease of native coronary artery without angina pectoris: Secondary | ICD-10-CM | POA: Diagnosis not present

## 2017-07-17 DIAGNOSIS — E119 Type 2 diabetes mellitus without complications: Secondary | ICD-10-CM | POA: Diagnosis not present

## 2017-07-18 ENCOUNTER — Encounter (HOSPITAL_COMMUNITY): Payer: Self-pay

## 2017-07-18 ENCOUNTER — Ambulatory Visit (HOSPITAL_COMMUNITY)
Admission: RE | Admit: 2017-07-18 | Discharge: 2017-07-18 | Disposition: A | Discharge status: Home / Self Care | Payer: Medicare HMO | Source: Ambulatory Visit | Attending: Student | Admitting: Student

## 2017-07-18 ENCOUNTER — Encounter (HOSPITAL_COMMUNITY)
Admission: RE | Admit: 2017-07-18 | Discharge: 2017-07-18 | Disposition: A | Discharge status: Home / Self Care | Payer: Medicare HMO | Source: Ambulatory Visit | Attending: Student | Admitting: Student

## 2017-07-18 DIAGNOSIS — I5042 Chronic combined systolic (congestive) and diastolic (congestive) heart failure: Secondary | ICD-10-CM

## 2017-07-18 DIAGNOSIS — R079 Chest pain, unspecified: Secondary | ICD-10-CM | POA: Diagnosis not present

## 2017-07-18 LAB — NM MYOCAR MULTI W/SPECT W/WALL MOTION / EF
CHL CUP NUCLEAR SDS: 2
CHL CUP NUCLEAR SRS: 1
LV dias vol: 151 mL (ref 46–106)
LVSYSVOL: 106 mL
Peak HR: 77 {beats}/min
RATE: 0.31
Rest HR: 63 {beats}/min
SSS: 3
TID: 1.05

## 2017-07-18 MED ORDER — REGADENOSON 0.4 MG/5ML IV SOLN
INTRAVENOUS | Status: AC
  Administered 2017-07-18: 0.4 mg via INTRAVENOUS
  Filled 2017-07-18: qty 5

## 2017-07-18 MED ORDER — SODIUM CHLORIDE 0.9% FLUSH
INTRAVENOUS | Status: AC
  Administered 2017-07-18: 10 mL via INTRAVENOUS
  Filled 2017-07-18: qty 10

## 2017-07-18 MED ORDER — TECHNETIUM TC 99M TETROFOSMIN IV KIT
30.0000 | PACK | Freq: Once | INTRAVENOUS | Status: AC | PRN
  Administered 2017-07-18: 31 via INTRAVENOUS

## 2017-07-18 MED ORDER — TECHNETIUM TC 99M TETROFOSMIN IV KIT
10.0000 | PACK | Freq: Once | INTRAVENOUS | Status: AC | PRN
  Administered 2017-07-18: 11 via INTRAVENOUS

## 2017-07-19 DIAGNOSIS — R69 Illness, unspecified: Secondary | ICD-10-CM | POA: Diagnosis not present

## 2017-07-22 ENCOUNTER — Ambulatory Visit (INDEPENDENT_AMBULATORY_CARE_PROVIDER_SITE_OTHER): Payer: Medicare HMO

## 2017-07-22 DIAGNOSIS — J449 Chronic obstructive pulmonary disease, unspecified: Secondary | ICD-10-CM

## 2017-07-22 DIAGNOSIS — I110 Hypertensive heart disease with heart failure: Secondary | ICD-10-CM | POA: Diagnosis not present

## 2017-07-22 DIAGNOSIS — I2510 Atherosclerotic heart disease of native coronary artery without angina pectoris: Secondary | ICD-10-CM

## 2017-07-22 DIAGNOSIS — I5033 Acute on chronic diastolic (congestive) heart failure: Secondary | ICD-10-CM | POA: Diagnosis not present

## 2017-07-24 DIAGNOSIS — J449 Chronic obstructive pulmonary disease, unspecified: Secondary | ICD-10-CM | POA: Diagnosis not present

## 2017-07-24 DIAGNOSIS — J90 Pleural effusion, not elsewhere classified: Secondary | ICD-10-CM | POA: Diagnosis not present

## 2017-07-24 DIAGNOSIS — I2510 Atherosclerotic heart disease of native coronary artery without angina pectoris: Secondary | ICD-10-CM | POA: Diagnosis not present

## 2017-07-24 DIAGNOSIS — K219 Gastro-esophageal reflux disease without esophagitis: Secondary | ICD-10-CM | POA: Diagnosis not present

## 2017-07-24 DIAGNOSIS — E785 Hyperlipidemia, unspecified: Secondary | ICD-10-CM | POA: Diagnosis not present

## 2017-07-24 DIAGNOSIS — I110 Hypertensive heart disease with heart failure: Secondary | ICD-10-CM | POA: Diagnosis not present

## 2017-07-24 DIAGNOSIS — E119 Type 2 diabetes mellitus without complications: Secondary | ICD-10-CM | POA: Diagnosis not present

## 2017-07-24 DIAGNOSIS — B356 Tinea cruris: Secondary | ICD-10-CM | POA: Diagnosis not present

## 2017-07-24 DIAGNOSIS — I5033 Acute on chronic diastolic (congestive) heart failure: Secondary | ICD-10-CM | POA: Diagnosis not present

## 2017-07-24 DIAGNOSIS — R1310 Dysphagia, unspecified: Secondary | ICD-10-CM | POA: Diagnosis not present

## 2017-07-25 ENCOUNTER — Telehealth: Payer: Self-pay | Admitting: Family

## 2017-07-25 DIAGNOSIS — H35033 Hypertensive retinopathy, bilateral: Secondary | ICD-10-CM | POA: Diagnosis not present

## 2017-07-25 DIAGNOSIS — H25813 Combined forms of age-related cataract, bilateral: Secondary | ICD-10-CM | POA: Diagnosis not present

## 2017-07-25 DIAGNOSIS — H353131 Nonexudative age-related macular degeneration, bilateral, early dry stage: Secondary | ICD-10-CM | POA: Diagnosis not present

## 2017-07-26 ENCOUNTER — Other Ambulatory Visit: Payer: Self-pay | Admitting: Family

## 2017-07-26 MED ORDER — NYSTATIN 100000 UNIT/GM EX POWD: Freq: Four times a day (QID) | CUTANEOUS | 0 refills | Status: DC

## 2017-07-26 MED ORDER — KETOCONAZOLE 2 % EX CREA: 1.0000 "application " | TOPICAL_CREAM | Freq: Every day | CUTANEOUS | 0 refills | Status: DC

## 2017-07-26 MED ORDER — FLUCONAZOLE 150 MG PO TABS: 150.0000 mg | ORAL_TABLET | Freq: Every day | ORAL | 0 refills | Status: DC

## 2017-07-26 NOTE — Telephone Encounter (Signed)
Aware. 

## 2017-07-26 NOTE — Telephone Encounter (Signed)
I sent in Diflucan, nystatin powder, and Ketoconazole cream. Keep clean and dry

## 2017-07-29 ENCOUNTER — Telehealth: Payer: Self-pay

## 2017-07-29 MED ORDER — FUROSEMIDE 40 MG PO TABS: 40.0000 mg | ORAL_TABLET | Freq: Two times a day (BID) | ORAL | 2 refills | Status: DC

## 2017-07-29 NOTE — Addendum Note (Signed)
Addended by: Jannifer RodneyHAWKS, CHRISTY A on: 07/29/2017 04:52 PM   Modules accepted: Orders

## 2017-07-29 NOTE — Telephone Encounter (Signed)
Prescription sent to pharmacy.

## 2017-07-29 NOTE — Telephone Encounter (Signed)
[facility] said they need a new RX for Furosemide 40 1 tablet daily up to 3 times a day  In med list as BID

## 2017-07-29 NOTE — Progress Notes (Signed)
Cardiology Office Note    Date:  07/30/2017   ID:  [name] [name], DOB 1932-11-19, MRN [id number]  PCP:  Junie Spencer, FNP  Cardiologist: Dr. Ladona Ridgel; Previously followed by Dr. Antoine Poche (last visit in 2015)   Chief Complaint  Patient presents with  . Follow-up    3 week visit    History of Present Illness:    [name] [name] is a 84 y.o. female with past medical history of CAD (cath in 1999 showing moderate multivessel CAD with medical management pursued, low-risk NST in 2010, partially reversible defect on NST in 08/2012), chronic combined systolic and diastolic CHF (EF 40-98% by echo in 06/2017), SSS (s/p Medtronic PPM placement in 2003), COPD, Type 2 DM, HTN, and HLD who presents to the office today for 3 week follow-up.  She was last examined by myself on 07/09/2017 following a recent hospitalization at [facility] for a CHF exacerbation. Her weight have been 190 pounds at the time of hospital discharge but was at 193 lbs on the office scales. She did note episodes of chest discomfort when walking or at rest which would last for 5-30-minute intervals. A repeat Lexiscan Myoview was recommended for ischemic evaluation. This was obtained on 07/18/2017 and showed a small apical anterior septal and inferior defect which was most consistent with scar versus soft tissue attenuation and no definitive ischemia was noted. EF was noted to be reduced to 30% (35-40% by recent echo).   In talking with the patient today, she denies any repeat episodes of chest discomfort. Does report occasional pain along her abdominal region which is relieved with belching. No association with food consumption or bowel movements. Reports breathing has been at baseline and she denies any recent orthopnea, PND, or palpitations.  She has been following her weight at home and says this is been stable at 184-185 lbs. Reports good compliance with her Lasix dosing and has been taking an occasional extra tablet as needed for  lower extremity edema.   Past Medical History:  Diagnosis Date  . ANXIETY   . Arteriosclerotic cardiovascular disease (ASCVD)    Cath in 9/99-70% mid LAD with diffuse distal disease, 70% T1, 60% mid circumflex, 50% mid RCA, normal ejection fraction; negative stress nuclear in 07/2008  . Carpal tunnel syndrome   . Cerebrovascular disease    carotid bruits; no focal disease in 1999, 2002 and 2006  . Closed left fibular fracture 11/30/11  . COPD   . Diabetes mellitus    Insulin treatment  . Diastolic CHF, chronic (HCC)    Normal EF  . Gastroesophageal reflux disease    With hiatal hernia; irritable bowel syndrome; H. pylori-treated; Colonoscopy 2008: non-specific colitis, IH, Diverticulosis, Rectal ulcer secondary to ASA  . Hyperlipidemia   . Hypertension   . Hypothyroidism   . OSTEOARTHRITIS, KNEES, BILATERAL    Bilateral TKA  . Pacemaker   . Peripheral neuropathy   . Sinoatrial node dysfunction (HCC) 2003   Medtronic dual-chamber device  . Sleep apnea   . VITAMIN B12 DEFICIENCY     Past Surgical History:  Procedure Laterality Date  . ABDOMINAL HYSTERECTOMY    . BLADDER REPAIR    . CARDIAC PACEMAKER PLACEMENT  2003   Medtronic, dual-chamber  . CARPAL TUNNEL RELEASE    . CHOLECYSTECTOMY    . COLONOSCOPY  2008  . HEMORRHOID SURGERY    . NASAL FRACTURE SURGERY    . THYROIDECTOMY  1980   Goiter  . TOTAL KNEE ARTHROPLASTY  Bilateral    Current Medications: Outpatient Medications Prior to Visit  Medication Sig Dispense Refill  . ALPRAZolam (XANAX) 0.5 MG tablet Take 1 tablet (0.5 mg total) by mouth at bedtime as needed for anxiety. 30 tablet 5  . aspirin EC 81 MG tablet Take 81 mg by mouth daily. Reported on 12/14/2015    . budesonide-formoterol (SYMBICORT) 80-4.5 MCG/ACT inhaler Inhale 2 puffs into the lungs 2 (two) times daily. 10.2 g 3  . clotrimazole (LOTRIMIN) 1 % cream Apply to affected right groin area twice daily 113 g 0  . cyclobenzaprine (FLEXERIL) 10 MG  tablet Take 1 tablet by mouth 3 (three) times daily as needed f or muscle spasms. 30 tablet 2  . fluconazole (DIFLUCAN) 150 MG tablet Take 1 tablet (150 mg total) by mouth daily. 7 tablet 0  . gabapentin (NEURONTIN) 300 MG capsule TAKE (1) CAPSULE THREE TIMES DAILY. 90 capsule 0  . glimepiride (AMARYL) 4 MG tablet TAKE (1) TABLET DAILY BE- FORE BREAKFAST. 90 tablet 0  . glucose blood (GLUCOSE METER TEST) test strip Use BID 100 each 12  . HYDROcodone-acetaminophen (NORCO/VICODIN) 5-325 MG tablet Take 1 tablet by mouth every 6 (six) hours as needed for moderate pain. 60 tablet 0  . insulin NPH Human (HUMULIN N) 100 UNIT/ML injection Inject 0.08 mLs (8 Units total) into the skin 2 (two) times daily before a meal.    . ketoconazole (NIZORAL) 2 % cream Apply 1 application topically daily. 15 g 0  . levothyroxine (SYNTHROID, LEVOTHROID) 200 MCG tablet TAKE (1) TABLET DAILY BE- FORE BREAKFAST. 90 tablet 3  . levothyroxine (SYNTHROID, LEVOTHROID) 25 MCG tablet TAKE 1 TABLET DAILY BEFORE BREAKFAST 90 tablet 2  . lisinopril (PRINIVIL,ZESTRIL) 10 MG tablet TAKE 1 TABLET DAILY 90 tablet 1  . meloxicam (MOBIC) 7.5 MG tablet Take 1 tablet (7.5 mg total) by mouth daily as needed for pain. 15 tablet 0  . metFORMIN (GLUCOPHAGE) 1000 MG tablet TAKE (1) TABLET TWICE A DAY WITH MEALS (BREAKFAST AND SUPPER) FOR DIAB ETES 180 tablet 0  . metoprolol succinate (TOPROL-XL) 25 MG 24 hr tablet Take 1 tablet (25 mg total) by mouth daily. Take with or immediately following a meal. (Patient taking differently: Take 12.5 mg by mouth daily. Take with or immediately following a meal.) 90 tablet 0  . nitroGLYCERIN (NITROLINGUAL) 0.4 MG/SPRAY spray Place 1 spray under the tongue every 5 (five) minutes as needed. 12 g 2  . nystatin (MYCOSTATIN/NYSTOP) powder Apply topically 4 (four) times daily. 15 g 0  . omeprazole (PRILOSEC) 20 MG capsule Take 20 mg by mouth daily.    . pravastatin (PRAVACHOL) 80 MG tablet TAKE 1 TABLET ONCE DAILY  FOR CHOLESTEROL 90 tablet 0  . PROAIR HFA 108 (90 Base) MCG/ACT inhaler 2 PUFFS EVERY 6 HOURS AS NEEDED FOR WHEEZING 25.5 g 1  . sertraline (ZOLOFT) 50 MG tablet TAKE 1 TABLET DAILY 90 tablet 0  . furosemide (LASIX) 40 MG tablet Take 1 tablet (40 mg total) by mouth 2 (two) times daily. 60 tablet 2  . potassium chloride 20 MEQ TBCR Take 20 mEq by mouth daily. 30 tablet 0   No facility-administered medications prior to visit.      Allergies:   Penicillins   Social History   Socioeconomic History  . Marital status: Married    Spouse name: husband has dementia  . Number of children: None  . Years of education: 8  . Highest education level: None  Social Needs  . Financial  resource strain: None  . Food insecurity - worry: None  . Food insecurity - inability: None  . Transportation needs - medical: None  . Transportation needs - non-medical: None  Occupational History  . Occupation: retired    Associate Professor: RETIRED  Tobacco Use  . Smoking status: Never Smoker  . Smokeless tobacco: Never Used  Substance and Sexual Activity  . Alcohol use: No    Alcohol/week: 0.0 oz  . Drug use: No  . Sexual activity: Not Currently  Other Topics Concern  . None  Social History Narrative  . None     Family History:  The patient's family history includes COPD in her father; Cancer in her brother, brother, daughter, sister, sister, sister, and son; Diabetes in her sister and son; Early death in her son; Heart attack (age of onset: 5) in her father; Heart disease in her son; Heart failure in her mother.   Review of Systems:   Please see the history of present illness.     General:  No chills, fever, night sweats or weight changes.  Cardiovascular:  No chest pain, orthopnea, palpitations, paroxysmal nocturnal dyspnea. Positive for dyspnea on exertion and edema.  Dermatological: No rash, lesions/masses Respiratory: No cough, dyspnea Urologic: No hematuria, dysuria Abdominal:   No nausea, vomiting,  diarrhea, bright red blood per rectum, melena, or hematemesis Neurologic:  No visual changes, wkns, changes in mental status. All other systems reviewed and are otherwise negative except as noted above.   Physical Exam:    VS:  BP 122/72 (BP Location: Left Arm)   Pulse 97   Ht 5\' 9"  (1.753 m)   Wt 191 lb (86.6 kg)   SpO2 93%   BMI 28.21 kg/m    General: Well developed, elderly Caucasian female appearing in no acute distress. Head: Normocephalic, atraumatic, sclera non-icteric, no xanthomas, nares are without discharge.  Neck: No carotid bruits. JVD not elevated.  Lungs: Respirations regular and unlabored, without wheezes or rales.  Heart: Regular rate and rhythm. No S3 or S4.  No murmur, no rubs, or gallops appreciated. Abdomen: Soft, non-tender, non-distended with normoactive bowel sounds. No hepatomegaly. No rebound/guarding. No obvious abdominal masses. Msk:  Strength and tone appear normal for age. No joint deformities or effusions. Extremities: No clubbing or cyanosis. Trace lower extremity edema.  Distal pedal pulses are 2+ bilaterally. Neuro: Alert and oriented X 3. Moves all extremities spontaneously. No focal deficits noted. Psych:  Responds to questions appropriately with a normal affect. Skin: No rashes or lesions noted  Wt Readings from Last 3 Encounters:  07/30/17 191 lb (86.6 kg)  07/09/17 193 lb (87.5 kg)  07/02/17 190 lb 14.7 oz (86.6 kg)     Studies/Labs Reviewed:   EKG:  EKG is not ordered today.    Recent Labs: 05/14/2017: TSH 3.950 07/01/2017: ALT 20; B Natriuretic Peptide 1,641.0 07/03/2017: Magnesium 1.3 07/11/2017: BUN 15; Creatinine, Ser 0.99; Hemoglobin 12.8; Platelets 250; Potassium 4.9; Sodium 139   Lipid Panel    Component Value Date/Time   CHOL 132 05/14/2017 1633   TRIG 102 05/14/2017 1633   TRIG 120 02/20/2013 1310   TRIG 101 12/11/2009   HDL 49 05/14/2017 1633   HDL 65 02/20/2013 1310   CHOLHDL 2.7 05/14/2017 1633   CHOLHDL 3 10/22/2011  1151   VLDL 31.8 10/22/2011 1151   LDLCALC 63 05/14/2017 1633   LDLCALC 69 02/20/2013 1310    Additional studies/ records that were reviewed today include:   Echocardiogram: 06/28/2017 Study Conclusions  -  Left ventricle: The cavity size was normal. Wall thickness was   increased in a pattern of moderate LVH. Systolic function was   moderately reduced. The estimated ejection fraction was in the   range of 35% to 40%. Diffuse hypokinesis. Doppler parameters are   consistent with abnormal left ventricular relaxation (grade 1   diastolic dysfunction). Doppler parameters are consistent with   both elevated ventricular end-diastolic filling pressure and   elevated left atrial filling pressure. - Mitral valve: Moderately calcified posterior annulus. There was   mild regurgitation. - Left atrium: The atrium was moderately to severely dilated. - Right ventricle: Pacer wire or catheter noted in right ventricle. - Right atrium: The atrium was mildly dilated. Pacer wire or   catheter noted in right atrium. - Atrial septum: No defect or patent foramen ovale was identified. - Tricuspid valve: There was mild regurgitation. - Pulmonary arteries: PA peak pressure: 42 mm Hg (S).  NST: 07/18/2017  Dual-chamber paced rhythm throughout.  Small, mild intensity, apical anteroseptal and inferior inferoseptal defects that are most consistent with scar versus soft tissue attenuation. No definitive ischemic defects.  This is a high risk study based on reduced ejection fraction.  Nuclear stress EF: 30%. Diffuse hypokinesis noted, consider echocardiogram for confirmation.  Assessment:    1. Chronic combined systolic and diastolic CHF (congestive heart failure) (HCC)   2. Coronary artery disease involving native coronary artery of native heart with angina pectoris (HCC)   3. Sinoatrial node dysfunction (HCC)   4. Essential hypertension   5. Hyperlipidemia LDL goal <70      Plan:   In order of  problems listed above:  1. Chronic Combined Systolic and Diastolic CHF - the patient has a known reduced EF of 35-40% by recent echo with NST showing no significant ischemia. She denies any recent orthopnea, PND, or changes in her respiratory status. Does have occasional lower extremity edema. Her energy level has continued to improve as she mentions traveling to hear bluegrass music last week for the first time in several months.  - she does not appear volume overloaded on physical examination and weight has declined by 2 lbs since her last office visit.  - will continue on Lasix at 40mg  BID. May take an additional tablet for edema or weight gain > 3 lbs overnight or > 5 lbs in one week. Recent BMET on 07/11/2017 showed stable kidney function and K+ levels.   2. CAD - s/p catheterization in 1999 showing moderate multivessel CAD with medical management recommended. Recent NST on 07/18/2017 showed a small apical anterior septal and inferior defect which was most consistent with scar versus soft tissue attenuation and no definitive ischemia was noted.  - she denies any recurrent chest pain. Does note abdominal pain occurring at times and associated with belching. She is already on PPI therapy. I recommended she follow-up with her PCP to further address her symptoms if they worsen or persist.  - continue ASA, BB, and statin therapy.   3. SSS - s/p Medtronic PPM placement in 2003. Due for remote check. Will arrange as she missed her check in 06/2017 due to her hospitalization.   4. HTN - BP is well-controlled at 122/72 during today's visit.  - continue current medication regimen.   5. HLD - recent FLP showed LDL was at 63. - continue Pravastatin 80mg  daily.    Medication Adjustments/Labs and Tests Ordered: Current medicines are reviewed at length with the patient today.  Concerns regarding medicines are outlined  above.  Medication changes, Labs and Tests ordered today are listed in the Patient  Instructions below. Patient Instructions  Medication Instructions:  Your physician recommends that you continue on your current medications as directed. Please refer to the Current Medication list given to you today.  Labwork: NONE   Testing/Procedures: NONE   Follow-Up: Your physician recommends that you schedule a follow-up appointment with Dr. Ladona Ridgelaylor.   Any Other Special Instructions Will Be Listed Below (If Applicable).  If you need a refill on your cardiac medications before your next appointment, please call your pharmacy.  Thank you for choosing [facility]!    Signed, Ellsworth LennoxBrittany M Strader, PA-C  07/30/2017 7:29 PM    [facility] [address] [place], [place] [address] Phone: [phone]

## 2017-07-30 ENCOUNTER — Ambulatory Visit: Payer: Medicare HMO | Admitting: Student

## 2017-07-30 ENCOUNTER — Encounter: Payer: Self-pay | Admitting: Student

## 2017-07-30 VITALS — BP 122/72 | HR 97 | Ht 69.0 in | Wt 191.0 lb

## 2017-07-30 DIAGNOSIS — I5042 Chronic combined systolic (congestive) and diastolic (congestive) heart failure: Secondary | ICD-10-CM | POA: Diagnosis not present

## 2017-07-30 DIAGNOSIS — I25119 Atherosclerotic heart disease of native coronary artery with unspecified angina pectoris: Secondary | ICD-10-CM | POA: Diagnosis not present

## 2017-07-30 DIAGNOSIS — E785 Hyperlipidemia, unspecified: Secondary | ICD-10-CM

## 2017-07-30 DIAGNOSIS — I495 Sick sinus syndrome: Secondary | ICD-10-CM

## 2017-07-30 DIAGNOSIS — I10 Essential (primary) hypertension: Secondary | ICD-10-CM

## 2017-07-30 MED ORDER — FUROSEMIDE 40 MG PO TABS: 40.0000 mg | ORAL_TABLET | Freq: Two times a day (BID) | ORAL | 11 refills | Status: DC

## 2017-07-30 MED ORDER — POTASSIUM CHLORIDE ER 20 MEQ PO TBCR: 20.0000 meq | EXTENDED_RELEASE_TABLET | Freq: Every day | ORAL | 11 refills | Status: DC

## 2017-07-30 NOTE — Patient Instructions (Signed)
Medication Instructions:  Your physician recommends that you continue on your current medications as directed. Please refer to the Current Medication list given to you today.   Labwork: NONE  Testing/Procedures: NONE  Follow-Up: Your physician recommends that you schedule a follow-up appointment with Dr.Taylor   Any Other Special Instructions Will Be Listed Below (If Applicable).     If you need a refill on your cardiac medications before your next appointment, please call your pharmacy.  Thank you for choosing [facility]!   

## 2017-08-02 NOTE — Patient Instructions (Signed)
Your procedure is scheduled on: 08/09/2017  Report to [facility] at  645   AM.  Call this number if you have problems the morning of surgery: [phone]   Do not eat food or drink liquids :After Midnight.      Take these medicines the morning of surgery with A SIP OF WATER: flexaril, gabapentin, hydrocodone, levothyroxine, lisinopril, mobic, metoprolol, prilosec, zoloft. Use your inhalers before you come.   Do not wear jewelry, make-up or nail polish.  Do not wear lotions, powders, or perfumes. You may wear deodorant.  Do not shave 48 hours prior to surgery.  Do not bring valuables to the hospital.  Contacts, dentures or bridgework may not be worn into surgery.  Leave suitcase in the car. After surgery it may be brought to your room.  For patients admitted to the hospital, checkout time is 11:00 AM the day of discharge.   Patients discharged the day of surgery will not be allowed to drive home.  :     Please read over the following fact sheets that you were given: Coughing and Deep Breathing, Surgical Site Infection Prevention, Anesthesia Post-op Instructions and Care and Recovery After Surgery    Cataract A cataract is a clouding of the lens of the eye. When a lens becomes cloudy, vision is reduced based on the degree and nature of the clouding. Many cataracts reduce vision to some degree. Some cataracts make people more near-sighted as they develop. Other cataracts increase glare. Cataracts that are ignored and become worse can sometimes look white. The white color can be seen through the pupil. CAUSES   Aging. However, cataracts may occur at any age, even in newborns.   Certain drugs.   Trauma to the eye.   Certain diseases such as diabetes.   Specific eye diseases such as chronic inflammation inside the eye or a sudden attack of a rare form of glaucoma.   Inherited or acquired medical problems.  SYMPTOMS   Gradual, progressive drop in vision in the affected eye.   Severe,  rapid visual loss. This most often happens when trauma is the cause.  DIAGNOSIS  To detect a cataract, an eye doctor examines the lens. Cataracts are best diagnosed with an exam of the eyes with the pupils enlarged (dilated) by drops.  TREATMENT  For an early cataract, vision may improve by using different eyeglasses or stronger lighting. If that does not help your vision, surgery is the only effective treatment. A cataract needs to be surgically removed when vision loss interferes with your everyday activities, such as driving, reading, or watching TV. A cataract may also have to be removed if it prevents examination or treatment of another eye problem. Surgery removes the cloudy lens and usually replaces it with a substitute lens (intraocular lens, IOL).  At a time when both you and your doctor agree, the cataract will be surgically removed. If you have cataracts in both eyes, only one is usually removed at a time. This allows the operated eye to heal and be out of danger from any possible problems after surgery (such as infection or poor wound healing). In rare cases, a cataract may be doing damage to your eye. In these cases, your caregiver may advise surgical removal right away. The vast majority of people who have cataract surgery have better vision afterward. HOME CARE INSTRUCTIONS  If you are not planning surgery, you may be asked to do the following:  Use different eyeglasses.   Use stronger  or brighter lighting.   Ask your eye doctor about reducing your medicine dose or changing medicines if it is thought that a medicine caused your cataract. Changing medicines does not make the cataract go away on its own.   Become familiar with your surroundings. Poor vision can lead to injury. Avoid bumping into things on the affected side. You are at a higher risk for tripping or falling.   Exercise extreme care when driving or operating machinery.   Wear sunglasses if you are sensitive to bright  light or experiencing problems with glare.  SEEK IMMEDIATE MEDICAL CARE IF:   You have a worsening or sudden vision loss.   You notice redness, swelling, or increasing pain in the eye.   You have a fever.  Document Released: 05/28/2005 Document Revised: 05/17/2011 Document Reviewed: 01/19/2011 [facility] Patient Information 2012 [place], [place].PATIENT INSTRUCTIONS POST-ANESTHESIA  IMMEDIATELY FOLLOWING SURGERY:  Do not drive or operate machinery for the first twenty four hours after surgery.  Do not make any important decisions for twenty four hours after surgery or while taking narcotic pain medications or sedatives.  If you develop intractable nausea and vomiting or a severe headache please notify your doctor immediately.  FOLLOW-UP:  Please make an appointment with your surgeon as instructed. You do not need to follow up with anesthesia unless specifically instructed to do so.  WOUND CARE INSTRUCTIONS (if applicable):  Keep a dry clean dressing on the anesthesia/puncture wound site if there is drainage.  Once the wound has quit draining you may leave it open to air.  Generally you should leave the bandage intact for twenty four hours unless there is drainage.  If the epidural site drains for more than 36-48 hours please call the anesthesia department.  QUESTIONS?:  Please feel free to call your physician or the hospital operator if you have any questions, and they will be happy to assist you.

## 2017-08-05 DIAGNOSIS — B356 Tinea cruris: Secondary | ICD-10-CM | POA: Diagnosis not present

## 2017-08-05 DIAGNOSIS — E119 Type 2 diabetes mellitus without complications: Secondary | ICD-10-CM | POA: Diagnosis not present

## 2017-08-05 DIAGNOSIS — E785 Hyperlipidemia, unspecified: Secondary | ICD-10-CM | POA: Diagnosis not present

## 2017-08-05 DIAGNOSIS — I5033 Acute on chronic diastolic (congestive) heart failure: Secondary | ICD-10-CM | POA: Diagnosis not present

## 2017-08-05 DIAGNOSIS — I110 Hypertensive heart disease with heart failure: Secondary | ICD-10-CM | POA: Diagnosis not present

## 2017-08-05 DIAGNOSIS — J90 Pleural effusion, not elsewhere classified: Secondary | ICD-10-CM | POA: Diagnosis not present

## 2017-08-05 DIAGNOSIS — R1310 Dysphagia, unspecified: Secondary | ICD-10-CM | POA: Diagnosis not present

## 2017-08-05 DIAGNOSIS — I2510 Atherosclerotic heart disease of native coronary artery without angina pectoris: Secondary | ICD-10-CM | POA: Diagnosis not present

## 2017-08-05 DIAGNOSIS — J449 Chronic obstructive pulmonary disease, unspecified: Secondary | ICD-10-CM | POA: Diagnosis not present

## 2017-08-05 DIAGNOSIS — K219 Gastro-esophageal reflux disease without esophagitis: Secondary | ICD-10-CM | POA: Diagnosis not present

## 2017-08-06 ENCOUNTER — Encounter (HOSPITAL_COMMUNITY): Payer: Self-pay

## 2017-08-06 ENCOUNTER — Other Ambulatory Visit: Payer: Self-pay

## 2017-08-06 ENCOUNTER — Encounter (HOSPITAL_COMMUNITY)
Admission: RE | Admit: 2017-08-06 | Discharge: 2017-08-06 | Disposition: A | Discharge status: Home / Self Care | Payer: Medicare HMO | Source: Ambulatory Visit | Attending: Ophthalmology | Admitting: Ophthalmology

## 2017-08-06 DIAGNOSIS — I110 Hypertensive heart disease with heart failure: Secondary | ICD-10-CM | POA: Diagnosis not present

## 2017-08-06 DIAGNOSIS — J449 Chronic obstructive pulmonary disease, unspecified: Secondary | ICD-10-CM | POA: Diagnosis not present

## 2017-08-06 DIAGNOSIS — E039 Hypothyroidism, unspecified: Secondary | ICD-10-CM | POA: Diagnosis not present

## 2017-08-06 DIAGNOSIS — K219 Gastro-esophageal reflux disease without esophagitis: Secondary | ICD-10-CM | POA: Diagnosis not present

## 2017-08-06 DIAGNOSIS — H25812 Combined forms of age-related cataract, left eye: Secondary | ICD-10-CM | POA: Diagnosis not present

## 2017-08-06 DIAGNOSIS — Z79899 Other long term (current) drug therapy: Secondary | ICD-10-CM | POA: Diagnosis not present

## 2017-08-06 DIAGNOSIS — E1136 Type 2 diabetes mellitus with diabetic cataract: Secondary | ICD-10-CM | POA: Diagnosis present

## 2017-08-06 DIAGNOSIS — I2510 Atherosclerotic heart disease of native coronary artery without angina pectoris: Secondary | ICD-10-CM | POA: Diagnosis not present

## 2017-08-06 DIAGNOSIS — F419 Anxiety disorder, unspecified: Secondary | ICD-10-CM | POA: Diagnosis not present

## 2017-08-06 DIAGNOSIS — I509 Heart failure, unspecified: Secondary | ICD-10-CM | POA: Diagnosis not present

## 2017-08-06 DIAGNOSIS — Z950 Presence of cardiac pacemaker: Secondary | ICD-10-CM | POA: Diagnosis not present

## 2017-08-06 DIAGNOSIS — Z794 Long term (current) use of insulin: Secondary | ICD-10-CM | POA: Diagnosis not present

## 2017-08-06 DIAGNOSIS — Z880 Allergy status to penicillin: Secondary | ICD-10-CM | POA: Diagnosis not present

## 2017-08-06 DIAGNOSIS — R69 Illness, unspecified: Secondary | ICD-10-CM | POA: Diagnosis not present

## 2017-08-06 DIAGNOSIS — G4730 Sleep apnea, unspecified: Secondary | ICD-10-CM | POA: Diagnosis not present

## 2017-08-06 DIAGNOSIS — E1151 Type 2 diabetes mellitus with diabetic peripheral angiopathy without gangrene: Secondary | ICD-10-CM | POA: Diagnosis not present

## 2017-08-06 HISTORY — DX: Unspecified hearing loss, unspecified ear: H91.90

## 2017-08-06 LAB — SURGICAL PCR SCREEN
MRSA, PCR: NEGATIVE
Staphylococcus aureus: NEGATIVE

## 2017-08-07 ENCOUNTER — Other Ambulatory Visit: Payer: Self-pay | Admitting: Family

## 2017-08-07 DIAGNOSIS — J449 Chronic obstructive pulmonary disease, unspecified: Secondary | ICD-10-CM

## 2017-08-08 DIAGNOSIS — J90 Pleural effusion, not elsewhere classified: Secondary | ICD-10-CM | POA: Diagnosis not present

## 2017-08-08 DIAGNOSIS — I110 Hypertensive heart disease with heart failure: Secondary | ICD-10-CM | POA: Diagnosis not present

## 2017-08-08 DIAGNOSIS — K219 Gastro-esophageal reflux disease without esophagitis: Secondary | ICD-10-CM | POA: Diagnosis not present

## 2017-08-08 DIAGNOSIS — R1310 Dysphagia, unspecified: Secondary | ICD-10-CM | POA: Diagnosis not present

## 2017-08-08 DIAGNOSIS — I5033 Acute on chronic diastolic (congestive) heart failure: Secondary | ICD-10-CM | POA: Diagnosis not present

## 2017-08-08 DIAGNOSIS — B356 Tinea cruris: Secondary | ICD-10-CM | POA: Diagnosis not present

## 2017-08-08 DIAGNOSIS — E119 Type 2 diabetes mellitus without complications: Secondary | ICD-10-CM | POA: Diagnosis not present

## 2017-08-08 DIAGNOSIS — J449 Chronic obstructive pulmonary disease, unspecified: Secondary | ICD-10-CM | POA: Diagnosis not present

## 2017-08-08 DIAGNOSIS — E785 Hyperlipidemia, unspecified: Secondary | ICD-10-CM | POA: Diagnosis not present

## 2017-08-08 DIAGNOSIS — I2510 Atherosclerotic heart disease of native coronary artery without angina pectoris: Secondary | ICD-10-CM | POA: Diagnosis not present

## 2017-08-09 ENCOUNTER — Ambulatory Visit (HOSPITAL_COMMUNITY): Payer: Medicare HMO | Admitting: Anesthesiology

## 2017-08-09 ENCOUNTER — Encounter (HOSPITAL_COMMUNITY): Payer: Self-pay

## 2017-08-09 ENCOUNTER — Encounter (HOSPITAL_COMMUNITY)
Admission: RE | Disposition: A | Discharge status: Home / Self Care | Payer: Self-pay | Source: Ambulatory Visit | Attending: Ophthalmology

## 2017-08-09 ENCOUNTER — Ambulatory Visit (HOSPITAL_COMMUNITY)
Admission: RE | Admit: 2017-08-09 | Discharge: 2017-08-09 | Disposition: A | Discharge status: Home / Self Care | Payer: Medicare HMO | Source: Ambulatory Visit | Attending: Ophthalmology | Admitting: Ophthalmology

## 2017-08-09 DIAGNOSIS — E1151 Type 2 diabetes mellitus with diabetic peripheral angiopathy without gangrene: Secondary | ICD-10-CM | POA: Diagnosis not present

## 2017-08-09 DIAGNOSIS — E039 Hypothyroidism, unspecified: Secondary | ICD-10-CM | POA: Diagnosis not present

## 2017-08-09 DIAGNOSIS — I2510 Atherosclerotic heart disease of native coronary artery without angina pectoris: Secondary | ICD-10-CM | POA: Insufficient documentation

## 2017-08-09 DIAGNOSIS — H25812 Combined forms of age-related cataract, left eye: Secondary | ICD-10-CM | POA: Diagnosis not present

## 2017-08-09 DIAGNOSIS — Z950 Presence of cardiac pacemaker: Secondary | ICD-10-CM | POA: Insufficient documentation

## 2017-08-09 DIAGNOSIS — E1136 Type 2 diabetes mellitus with diabetic cataract: Secondary | ICD-10-CM | POA: Insufficient documentation

## 2017-08-09 DIAGNOSIS — I509 Heart failure, unspecified: Secondary | ICD-10-CM | POA: Insufficient documentation

## 2017-08-09 DIAGNOSIS — F419 Anxiety disorder, unspecified: Secondary | ICD-10-CM | POA: Insufficient documentation

## 2017-08-09 DIAGNOSIS — I110 Hypertensive heart disease with heart failure: Secondary | ICD-10-CM | POA: Diagnosis not present

## 2017-08-09 DIAGNOSIS — J449 Chronic obstructive pulmonary disease, unspecified: Secondary | ICD-10-CM | POA: Insufficient documentation

## 2017-08-09 DIAGNOSIS — R69 Illness, unspecified: Secondary | ICD-10-CM | POA: Diagnosis not present

## 2017-08-09 DIAGNOSIS — Z880 Allergy status to penicillin: Secondary | ICD-10-CM | POA: Insufficient documentation

## 2017-08-09 DIAGNOSIS — Z794 Long term (current) use of insulin: Secondary | ICD-10-CM | POA: Insufficient documentation

## 2017-08-09 DIAGNOSIS — Z79899 Other long term (current) drug therapy: Secondary | ICD-10-CM | POA: Insufficient documentation

## 2017-08-09 DIAGNOSIS — G4730 Sleep apnea, unspecified: Secondary | ICD-10-CM | POA: Diagnosis not present

## 2017-08-09 DIAGNOSIS — K219 Gastro-esophageal reflux disease without esophagitis: Secondary | ICD-10-CM | POA: Insufficient documentation

## 2017-08-09 DIAGNOSIS — H2512 Age-related nuclear cataract, left eye: Secondary | ICD-10-CM | POA: Diagnosis not present

## 2017-08-09 HISTORY — PX: CATARACT EXTRACTION W/PHACO: SHX586

## 2017-08-09 LAB — GLUCOSE, CAPILLARY: Glucose-Capillary: 114 mg/dL — ABNORMAL HIGH (ref 65–99)

## 2017-08-09 SURGERY — PHACOEMULSIFICATION, CATARACT, WITH IOL INSERTION
Anesthesia: Monitor Anesthesia Care | Site: Eye | Laterality: Left

## 2017-08-09 MED ORDER — TETRACAINE 0.5 % OP SOLN OPTIME - NO CHARGE
OPHTHALMIC | Status: DC | PRN
  Administered 2017-08-09: 2 [drp] via OPHTHALMIC

## 2017-08-09 MED ORDER — IPRATROPIUM-ALBUTEROL 0.5-2.5 (3) MG/3ML IN SOLN
3.0000 mL | Freq: Once | RESPIRATORY_TRACT | Status: AC
  Administered 2017-08-09: 3 mL via RESPIRATORY_TRACT

## 2017-08-09 MED ORDER — FENTANYL CITRATE (PF) 100 MCG/2ML IJ SOLN
25.0000 ug | Freq: Once | INTRAMUSCULAR | Status: AC
  Administered 2017-08-09: 25 ug via INTRAVENOUS

## 2017-08-09 MED ORDER — SODIUM HYALURONATE 23 MG/ML IO SOLN
INTRAOCULAR | Status: DC | PRN
  Administered 2017-08-09: 0.6 mL via INTRAOCULAR

## 2017-08-09 MED ORDER — NEOMYCIN-POLYMYXIN-DEXAMETH 3.5-10000-0.1 OP SUSP
OPHTHALMIC | Status: DC | PRN
  Administered 2017-08-09: 2 [drp] via OPHTHALMIC

## 2017-08-09 MED ORDER — TETRACAINE HCL 0.5 % OP SOLN
1.0000 [drp] | OPHTHALMIC | Status: AC
  Administered 2017-08-09 (×3): 1 [drp] via OPHTHALMIC

## 2017-08-09 MED ORDER — BSS IO SOLN
INTRAOCULAR | Status: DC | PRN
Start: 2017-08-09 — End: 2017-08-09
  Administered 2017-08-09: 15 mL

## 2017-08-09 MED ORDER — POVIDONE-IODINE 5 % OP SOLN
OPHTHALMIC | Status: DC | PRN
  Administered 2017-08-09: 1 via OPHTHALMIC

## 2017-08-09 MED ORDER — LIDOCAINE HCL 3.5 % OP GEL
1.0000 "application " | Freq: Once | OPHTHALMIC | Status: AC
  Administered 2017-08-09: 1 via OPHTHALMIC

## 2017-08-09 MED ORDER — IPRATROPIUM-ALBUTEROL 0.5-2.5 (3) MG/3ML IN SOLN
RESPIRATORY_TRACT | Status: AC
  Filled 2017-08-09: qty 3

## 2017-08-09 MED ORDER — LACTATED RINGERS IV SOLN
INTRAVENOUS | Status: DC
  Administered 2017-08-09: 08:00:00 via INTRAVENOUS

## 2017-08-09 MED ORDER — PROVISC 10 MG/ML IO SOLN
INTRAOCULAR | Status: DC | PRN
  Administered 2017-08-09: 0.85 mL via INTRAOCULAR

## 2017-08-09 MED ORDER — EPINEPHRINE PF 1 MG/ML IJ SOLN
INTRAOCULAR | Status: DC | PRN
  Administered 2017-08-09: 500 mL

## 2017-08-09 MED ORDER — CYCLOPENTOLATE-PHENYLEPHRINE 0.2-1 % OP SOLN
1.0000 [drp] | OPHTHALMIC | Status: AC
  Administered 2017-08-09 (×3): 1 [drp] via OPHTHALMIC

## 2017-08-09 MED ORDER — PHENYLEPHRINE HCL 2.5 % OP SOLN
1.0000 [drp] | OPHTHALMIC | Status: AC
  Administered 2017-08-09 (×3): 1 [drp] via OPHTHALMIC

## 2017-08-09 MED ORDER — MIDAZOLAM HCL 2 MG/2ML IJ SOLN
INTRAMUSCULAR | Status: AC
  Filled 2017-08-09: qty 2

## 2017-08-09 MED ORDER — LIDOCAINE HCL (PF) 1 % IJ SOLN
INTRAOCULAR | Status: DC | PRN
  Administered 2017-08-09: 1 mL via OPHTHALMIC

## 2017-08-09 MED ORDER — FENTANYL CITRATE (PF) 100 MCG/2ML IJ SOLN
INTRAMUSCULAR | Status: AC
  Filled 2017-08-09: qty 2

## 2017-08-09 MED ORDER — MIDAZOLAM HCL 2 MG/2ML IJ SOLN
1.0000 mg | INTRAMUSCULAR | Status: AC
  Administered 2017-08-09 (×2): 1 mg via INTRAVENOUS

## 2017-08-09 SURGICAL SUPPLY — 14 items
CLOTH BEACON ORANGE TIMEOUT ST (SAFETY) ×2 IMPLANT
EYE SHIELD UNIVERSAL CLEAR (GAUZE/BANDAGES/DRESSINGS) ×2 IMPLANT
GLOVE BIOGEL PI IND STRL 6.5 (GLOVE) ×1 IMPLANT
GLOVE BIOGEL PI IND STRL 7.0 (GLOVE) ×1 IMPLANT
GLOVE BIOGEL PI INDICATOR 6.5 (GLOVE) ×1
GLOVE BIOGEL PI INDICATOR 7.0 (GLOVE) ×1
LENS ALC ACRYL/TECN (Ophthalmic Related) ×2 IMPLANT
NEEDLE HYPO 18GX1.5 BLUNT FILL (NEEDLE) ×2 IMPLANT
PAD ARMBOARD 7.5X6 YLW CONV (MISCELLANEOUS) ×2 IMPLANT
SYR TB 1ML LL NO SAFETY (SYRINGE) ×2 IMPLANT
TAPE SURG TRANSPORE 1 IN (GAUZE/BANDAGES/DRESSINGS) ×1 IMPLANT
TAPE SURGICAL TRANSPORE 1 IN (GAUZE/BANDAGES/DRESSINGS) ×1
VISCOELASTIC ADDITIONAL (OPHTHALMIC RELATED) ×2 IMPLANT
WATER STERILE IRR 250ML POUR (IV SOLUTION) ×2 IMPLANT

## 2017-08-09 NOTE — Op Note (Signed)
Date of procedure: 08/09/17  Pre-operative diagnosis: Visually significant cataract, Left Eye  Post-operative diagnosis: Visually significant cataract, Left Eye  Procedure: Removal of cataract via phacoemulsification and insertion of intra-ocular lens [name] [name] and [place]  +19.5D into the capsular bag of the Left Eye  Attending surgeon: Gerda Diss. Noga Fogg, MD, MA  Anesthesia: MAC, Topical [place]  Complications: None  Estimated Blood Loss: <70m (minimal)  Specimens: None  Implants: As above  Indications:  Visually significant cataract, Left Eye  Procedure:  The patient was seen and identified in the pre-operative area. The operative eye was identified and dilated.  The operative eye was marked.  Topical anesthesia was administered to the operative eye.     The patient was then to the operative suite and placed in the supine position.  A timeout was performed confirming the patient, procedure to be performed, and all other relevant information.   The patient's face was prepped and draped in the usual fashion for intra-ocular surgery.  A lid speculum was placed into the operative eye and the surgical microscope moved into place and focused.  An inferotemporal paracentesis was created using a 20 gauge paracentesis blade.  Shugarcaine was injected into the anterior chamber.  Viscoelastic was injected into the anterior chamber.  A temporal clear-corneal main wound incision was created using a 2.485mmicrokeratome.  A continuous curvilinear capsulorrhexis was initiated using an irrigating cystitome and completed using capsulorrhexis forceps.  Hydrodissection and hydrodeliniation were performed.  Viscoelastic was injected into the anterior chamber.  A phacoemulsification handpiece and a chopper as a second instrument were used to remove the nucleus and epinucleus. The irrigation/aspiration handpiece was used to remove any remaining cortical material.   The capsular bag was reinflated with  viscoelastic, checked, and found to be intact.  The intraocular lens was inserted into the capsular bag and dialed into place using a Kuglen hook.  The irrigation/aspiration handpiece was used to remove any remaining viscoelastic.  The clear corneal wound and paracentesis wounds were then hydrated and checked with Weck-Cels to be watertight.  The lid-speculum and drape was removed, and the patient's face was cleaned with a wet and dry 4x4.  Maxitrol was instilled in the eye before a clear shield was taped over the eye. The patient was taken to the post-operative care unit in good condition, having tolerated the procedure well.  Post-Op Instructions: [facility] and will receive all other orders and instructions.

## 2017-08-09 NOTE — Anesthesia Postprocedure Evaluation (Signed)
Anesthesia Post Note  Patient: [name] [name]  Procedure(s) Performed: CATARACT EXTRACTION PHACO AND INTRAOCULAR LENS PLACEMENT ([place]) (Left Eye)  Patient location during evaluation: Short Stay Anesthesia Type: MAC Level of consciousness: awake Pain management: pain level controlled Vital Signs Assessment: post-procedure vital signs reviewed and stable Respiratory status: spontaneous breathing, nonlabored ventilation and respiratory function stable Cardiovascular status: blood pressure returned to baseline Postop Assessment: adequate PO intake and no apparent nausea or vomiting Anesthetic complications: no     Last Vitals:  Vitals:   08/09/17 0805 08/09/17 0810  BP: (!) 178/70   Pulse:    Resp: 20 (!) 22  Temp:    SpO2: 92% 92%    Last Pain:  Vitals:   08/09/17 0732  TempSrc: Oral                 Paulina Muchmore J

## 2017-08-09 NOTE — H&P (Signed)
The H and P was reviewed and updated. The patient was examined.  No changes were found after exam.  The surgical eye was marked.  

## 2017-08-09 NOTE — Discharge Instructions (Signed)
Please discharge patient when stable, will follow up today with Dr. Jessyca Sloan at the [facility] office immediately following discharge.  Leave shield in place until visit.  All paperwork with discharge instructions will be given at the office. ° ° °PATIENT INSTRUCTIONS °POST-ANESTHESIA ° °IMMEDIATELY FOLLOWING SURGERY:  Do not drive or operate machinery for the first twenty four hours after surgery.  Do not make any important decisions for twenty four hours after surgery or while taking narcotic pain medications or sedatives.  If you develop intractable nausea and vomiting or a severe headache please notify your doctor immediately. ° °FOLLOW-UP:  Please make an appointment with your surgeon as instructed. You do not need to follow up with anesthesia unless specifically instructed to do so. ° °WOUND CARE INSTRUCTIONS (if applicable):  Keep a dry clean dressing on the anesthesia/puncture wound site if there is drainage.  Once the wound has quit draining you may leave it open to air.  Generally you should leave the bandage intact for twenty four hours unless there is drainage.  If the epidural site drains for more than 36-48 hours please call the anesthesia department. ° °QUESTIONS?:  Please feel free to call your physician or the hospital operator if you have any questions, and they will be happy to assist you.    ° ° ° °

## 2017-08-09 NOTE — Anesthesia Preprocedure Evaluation (Signed)
Anesthesia Evaluation  Patient identified by MRN, date of birth, ID band Patient awake    Reviewed: Allergy & Precautions, NPO status , Patient's Chart, lab work & pertinent test results  Airway Mallampati: III  TM Distance: >3 FB     Dental  (+) Teeth Intact, Poor Dentition, Chipped   Pulmonary sleep apnea , COPD,   RA sat=88%  breath sounds clear to auscultation       Cardiovascular hypertension, + CAD, + Peripheral Vascular Disease and +CHF  + pacemaker  Rhythm:Regular Rate:Normal     Neuro/Psych PSYCHIATRIC DISORDERS Anxiety  Neuromuscular disease    GI/Hepatic GERD  ,  Endo/Other  diabetes, Type 2Hypothyroidism   Renal/GU      Musculoskeletal   Abdominal   Peds  Hematology   Anesthesia Other Findings   Reproductive/Obstetrics                             Anesthesia Physical Anesthesia Plan  ASA: III  Anesthesia Plan: MAC   Post-op Pain Management:    Induction: Intravenous  PONV Risk Score and Plan:   Airway Management Planned: Nasal Cannula  Additional Equipment:   Intra-op Plan:   Post-operative Plan:   Informed Consent: I have reviewed the patients History and Physical, chart, labs and discussed the procedure including the risks, benefits and alternatives for the proposed anesthesia with the patient or authorized representative who has indicated his/her understanding and acceptance.     Plan Discussed with:   Anesthesia Plan Comments:         Anesthesia Quick Evaluation

## 2017-08-09 NOTE — Transfer of Care (Signed)
Immediate Anesthesia Transfer of Care Note  Patient: [name] [name]  Procedure(s) Performed: CATARACT EXTRACTION PHACO AND INTRAOCULAR LENS PLACEMENT (IOC) (Left Eye)  Patient Location: PACU  Anesthesia Type:MAC  Level of Consciousness: awake and patient cooperative  Airway & Oxygen Therapy: Patient Spontanous Breathing  Post-op Assessment: Report given to RN, Post -op Vital signs reviewed and stable and Patient moving all extremities  Post vital signs: Reviewed and stable  Last Vitals:  Vitals:   08/09/17 0805 08/09/17 0810  BP: (!) 178/70   Pulse:    Resp: 20 (!) 22  Temp:    SpO2: 92% 92%    Last Pain:  Vitals:   08/09/17 0732  TempSrc: Oral         Complications: No apparent anesthesia complications

## 2017-08-12 ENCOUNTER — Encounter (HOSPITAL_COMMUNITY): Payer: Self-pay | Admitting: Ophthalmology

## 2017-08-12 DIAGNOSIS — E119 Type 2 diabetes mellitus without complications: Secondary | ICD-10-CM | POA: Diagnosis not present

## 2017-08-12 DIAGNOSIS — I2510 Atherosclerotic heart disease of native coronary artery without angina pectoris: Secondary | ICD-10-CM | POA: Diagnosis not present

## 2017-08-12 DIAGNOSIS — K219 Gastro-esophageal reflux disease without esophagitis: Secondary | ICD-10-CM | POA: Diagnosis not present

## 2017-08-12 DIAGNOSIS — B356 Tinea cruris: Secondary | ICD-10-CM | POA: Diagnosis not present

## 2017-08-12 DIAGNOSIS — E785 Hyperlipidemia, unspecified: Secondary | ICD-10-CM | POA: Diagnosis not present

## 2017-08-12 DIAGNOSIS — I5033 Acute on chronic diastolic (congestive) heart failure: Secondary | ICD-10-CM | POA: Diagnosis not present

## 2017-08-12 DIAGNOSIS — I110 Hypertensive heart disease with heart failure: Secondary | ICD-10-CM | POA: Diagnosis not present

## 2017-08-12 DIAGNOSIS — R1310 Dysphagia, unspecified: Secondary | ICD-10-CM | POA: Diagnosis not present

## 2017-08-12 DIAGNOSIS — J90 Pleural effusion, not elsewhere classified: Secondary | ICD-10-CM | POA: Diagnosis not present

## 2017-08-12 DIAGNOSIS — J449 Chronic obstructive pulmonary disease, unspecified: Secondary | ICD-10-CM | POA: Diagnosis not present

## 2017-08-19 ENCOUNTER — Encounter (HOSPITAL_COMMUNITY)
Admission: RE | Admit: 2017-08-19 | Discharge: 2017-08-19 | Disposition: A | Discharge status: Home / Self Care | Payer: Medicare HMO | Source: Ambulatory Visit | Attending: Ophthalmology | Admitting: Ophthalmology

## 2017-08-19 ENCOUNTER — Encounter (HOSPITAL_COMMUNITY): Payer: Self-pay

## 2017-08-20 ENCOUNTER — Other Ambulatory Visit: Payer: Self-pay | Admitting: Family

## 2017-08-20 DIAGNOSIS — G6289 Other specified polyneuropathies: Secondary | ICD-10-CM

## 2017-08-23 DIAGNOSIS — I2510 Atherosclerotic heart disease of native coronary artery without angina pectoris: Secondary | ICD-10-CM | POA: Diagnosis not present

## 2017-08-23 DIAGNOSIS — I110 Hypertensive heart disease with heart failure: Secondary | ICD-10-CM | POA: Diagnosis not present

## 2017-08-23 DIAGNOSIS — R1310 Dysphagia, unspecified: Secondary | ICD-10-CM | POA: Diagnosis not present

## 2017-08-23 DIAGNOSIS — J90 Pleural effusion, not elsewhere classified: Secondary | ICD-10-CM | POA: Diagnosis not present

## 2017-08-23 DIAGNOSIS — B356 Tinea cruris: Secondary | ICD-10-CM | POA: Diagnosis not present

## 2017-08-23 DIAGNOSIS — E119 Type 2 diabetes mellitus without complications: Secondary | ICD-10-CM | POA: Diagnosis not present

## 2017-08-23 DIAGNOSIS — K219 Gastro-esophageal reflux disease without esophagitis: Secondary | ICD-10-CM | POA: Diagnosis not present

## 2017-08-23 DIAGNOSIS — E785 Hyperlipidemia, unspecified: Secondary | ICD-10-CM | POA: Diagnosis not present

## 2017-08-23 DIAGNOSIS — I5033 Acute on chronic diastolic (congestive) heart failure: Secondary | ICD-10-CM | POA: Diagnosis not present

## 2017-08-23 DIAGNOSIS — J449 Chronic obstructive pulmonary disease, unspecified: Secondary | ICD-10-CM | POA: Diagnosis not present

## 2017-08-28 ENCOUNTER — Other Ambulatory Visit: Payer: Self-pay | Admitting: Family

## 2017-08-28 DIAGNOSIS — F411 Generalized anxiety disorder: Secondary | ICD-10-CM

## 2017-08-29 ENCOUNTER — Telehealth: Payer: Self-pay | Admitting: Family

## 2017-08-29 NOTE — Telephone Encounter (Signed)
Pt thought she had missed an appt, but [facility] advised of this and voiced understanding.

## 2017-09-02 ENCOUNTER — Ambulatory Visit (INDEPENDENT_AMBULATORY_CARE_PROVIDER_SITE_OTHER): Payer: Medicare HMO

## 2017-09-02 ENCOUNTER — Telehealth: Payer: Self-pay | Admitting: Family

## 2017-09-02 ENCOUNTER — Ambulatory Visit (INDEPENDENT_AMBULATORY_CARE_PROVIDER_SITE_OTHER): Payer: Medicare HMO | Admitting: Family Medicine

## 2017-09-02 ENCOUNTER — Encounter: Payer: Self-pay | Admitting: Family Medicine

## 2017-09-02 VITALS — BP 154/67 | HR 72 | Temp 100.3°F | Ht 70.0 in | Wt 186.2 lb

## 2017-09-02 DIAGNOSIS — R05 Cough: Secondary | ICD-10-CM

## 2017-09-02 DIAGNOSIS — G6289 Other specified polyneuropathies: Secondary | ICD-10-CM | POA: Diagnosis not present

## 2017-09-02 DIAGNOSIS — R059 Cough, unspecified: Secondary | ICD-10-CM

## 2017-09-02 DIAGNOSIS — R071 Chest pain on breathing: Secondary | ICD-10-CM | POA: Diagnosis not present

## 2017-09-02 MED ORDER — LEVOFLOXACIN 750 MG PO TABS
750.0000 mg | ORAL_TABLET | Freq: Every day | ORAL | 0 refills | Status: DC
Start: 2017-09-02 — End: 2017-10-14

## 2017-09-02 MED ORDER — HYDROCODONE-ACETAMINOPHEN 5-325 MG PO TABS: 1.0000 | ORAL_TABLET | Freq: Four times a day (QID) | ORAL | 0 refills | Status: DC | PRN

## 2017-09-02 NOTE — Telephone Encounter (Signed)
appt scheduled

## 2017-09-02 NOTE — Telephone Encounter (Signed)
What symptoms do you have? Getting so that she can't breath and coughing up stuff that makes her not be able to breathe.  How long have you been sick? 08-31-17  Have you been seen for this problem? Has been in the hospital twice for it   If your provider decides to give you a prescription, which pharmacy would you like for it to be sent to? [facility].   Patient informed that this information will be sent to the clinical staff for review and that they should receive a follow up call.

## 2017-09-02 NOTE — Progress Notes (Signed)
   HPI  Patient presents today here with cough.  Patient states that she has had slightly worsening shortness of breath, cough, and right-sided pleuritic chest pain for about 3 or 4 days.  Her son-in-law is present with her and states that she has been having a hard time breathing for 5 or more years, however she does seem to be worse than usual. Patient states that she has similar pain compared to previous episode of pleural effusion during a CHF exacerbation which was treated with thoracentesis.  Patient denies any severe shortness of breath.  She states she is felt ill for 3 or 4 days. She is tolerating food and fluids like usual. She reports very good urine output with Lasix, she denies any leg swelling  PMH: Smoking status noted ROS: Per HPI  Objective: BP (!) 154/67   Pulse 72   Temp 100.3 F (37.9 C) (Oral)   Ht 5\' 10"  (1.778 m)   Wt 186 lb 3.2 oz (84.5 kg)   SpO2 92%   BMI 26.72 kg/m  Gen: NAD, alert, cooperative with exam HEENT: NCAT CV: RRR, good S1/S2, no murmur Resp: Nonlabored, soft rales at the bilateral bases Ext: No edema, warm Neuro: Alert and oriented, No gross deficits  Assessment and plan:  #Cough Pleural effusion versus pneumonia, given her temperature I have gone ahead and covered for pneumonia. She has good likelihood of having a recurrent right-sided effusion, I would recommend seeing her cardiologist right away if this has redeveloped to its previous level. If it has recurred I would also recommend considering pulmonology evaluation.  I have refilled a small amount of her hydrocodone, recommended that she follow-up with her PCP as soon as possible for follow-up of chronic medical conditions.     Meds ordered this encounter  Medications  . DISCONTD: HYDROcodone-acetaminophen (NORCO/VICODIN) 5-325 MG tablet    Sig: Take 1 tablet by mouth every 6 (six) hours as needed for moderate pain.    Dispense:  20 tablet    Refill:  0    DO Not fill until  30 days from prescription date  . HYDROcodone-acetaminophen (NORCO/VICODIN) 5-325 MG tablet    Sig: Take 1 tablet by mouth every 6 (six) hours as needed for moderate pain.    Dispense:  20 tablet    Refill:  0  . levofloxacin (LEVAQUIN) 750 MG tablet    Sig: Take 1 tablet (750 mg total) by mouth daily.    Dispense:  7 tablet    Refill:  0    Murtis SinkSam Nikoletta Varma, MD [facility] 09/02/2017, 1:30 PM

## 2017-09-03 DIAGNOSIS — I5020 Unspecified systolic (congestive) heart failure: Secondary | ICD-10-CM | POA: Diagnosis not present

## 2017-09-03 DIAGNOSIS — E871 Hypo-osmolality and hyponatremia: Secondary | ICD-10-CM | POA: Diagnosis not present

## 2017-09-03 DIAGNOSIS — R079 Chest pain, unspecified: Secondary | ICD-10-CM | POA: Diagnosis not present

## 2017-09-03 DIAGNOSIS — E1340 Other specified diabetes mellitus with diabetic neuropathy, unspecified: Secondary | ICD-10-CM | POA: Diagnosis not present

## 2017-09-03 DIAGNOSIS — E7800 Pure hypercholesterolemia, unspecified: Secondary | ICD-10-CM | POA: Diagnosis not present

## 2017-09-03 DIAGNOSIS — R0602 Shortness of breath: Secondary | ICD-10-CM | POA: Diagnosis not present

## 2017-09-03 DIAGNOSIS — M6281 Muscle weakness (generalized): Secondary | ICD-10-CM | POA: Diagnosis not present

## 2017-09-03 DIAGNOSIS — E039 Hypothyroidism, unspecified: Secondary | ICD-10-CM | POA: Diagnosis not present

## 2017-09-03 DIAGNOSIS — I5021 Acute systolic (congestive) heart failure: Secondary | ICD-10-CM | POA: Diagnosis not present

## 2017-09-03 DIAGNOSIS — R0902 Hypoxemia: Secondary | ICD-10-CM | POA: Diagnosis not present

## 2017-09-03 DIAGNOSIS — G629 Polyneuropathy, unspecified: Secondary | ICD-10-CM | POA: Diagnosis not present

## 2017-09-03 DIAGNOSIS — I5023 Acute on chronic systolic (congestive) heart failure: Secondary | ICD-10-CM | POA: Diagnosis not present

## 2017-09-03 DIAGNOSIS — I110 Hypertensive heart disease with heart failure: Secondary | ICD-10-CM | POA: Diagnosis not present

## 2017-09-03 DIAGNOSIS — I10 Essential (primary) hypertension: Secondary | ICD-10-CM | POA: Diagnosis not present

## 2017-09-03 DIAGNOSIS — R7989 Other specified abnormal findings of blood chemistry: Secondary | ICD-10-CM | POA: Diagnosis not present

## 2017-09-03 DIAGNOSIS — R531 Weakness: Secondary | ICD-10-CM | POA: Diagnosis not present

## 2017-09-03 DIAGNOSIS — I509 Heart failure, unspecified: Secondary | ICD-10-CM | POA: Diagnosis not present

## 2017-09-03 DIAGNOSIS — J159 Unspecified bacterial pneumonia: Secondary | ICD-10-CM | POA: Diagnosis not present

## 2017-09-03 DIAGNOSIS — J189 Pneumonia, unspecified organism: Secondary | ICD-10-CM | POA: Diagnosis not present

## 2017-09-03 DIAGNOSIS — J439 Emphysema, unspecified: Secondary | ICD-10-CM | POA: Diagnosis not present

## 2017-09-03 DIAGNOSIS — R69 Illness, unspecified: Secondary | ICD-10-CM | POA: Diagnosis not present

## 2017-09-03 DIAGNOSIS — I5032 Chronic diastolic (congestive) heart failure: Secondary | ICD-10-CM | POA: Diagnosis not present

## 2017-09-03 DIAGNOSIS — E119 Type 2 diabetes mellitus without complications: Secondary | ICD-10-CM | POA: Diagnosis not present

## 2017-09-03 DIAGNOSIS — R2681 Unsteadiness on feet: Secondary | ICD-10-CM | POA: Diagnosis not present

## 2017-09-03 DIAGNOSIS — R0789 Other chest pain: Secondary | ICD-10-CM | POA: Diagnosis not present

## 2017-09-10 ENCOUNTER — Ambulatory Visit: Payer: Medicare HMO | Admitting: Family

## 2017-09-11 DIAGNOSIS — M6281 Muscle weakness (generalized): Secondary | ICD-10-CM | POA: Diagnosis not present

## 2017-09-11 DIAGNOSIS — I5021 Acute systolic (congestive) heart failure: Secondary | ICD-10-CM | POA: Diagnosis not present

## 2017-09-11 DIAGNOSIS — E7800 Pure hypercholesterolemia, unspecified: Secondary | ICD-10-CM | POA: Diagnosis not present

## 2017-09-11 DIAGNOSIS — E039 Hypothyroidism, unspecified: Secondary | ICD-10-CM | POA: Diagnosis not present

## 2017-09-11 DIAGNOSIS — R2681 Unsteadiness on feet: Secondary | ICD-10-CM | POA: Diagnosis not present

## 2017-09-11 DIAGNOSIS — E1340 Other specified diabetes mellitus with diabetic neuropathy, unspecified: Secondary | ICD-10-CM | POA: Diagnosis not present

## 2017-09-11 DIAGNOSIS — R0902 Hypoxemia: Secondary | ICD-10-CM | POA: Diagnosis not present

## 2017-09-11 DIAGNOSIS — R531 Weakness: Secondary | ICD-10-CM | POA: Diagnosis not present

## 2017-09-11 DIAGNOSIS — G629 Polyneuropathy, unspecified: Secondary | ICD-10-CM | POA: Diagnosis not present

## 2017-09-11 DIAGNOSIS — E119 Type 2 diabetes mellitus without complications: Secondary | ICD-10-CM | POA: Diagnosis not present

## 2017-09-11 DIAGNOSIS — R69 Illness, unspecified: Secondary | ICD-10-CM | POA: Diagnosis not present

## 2017-09-11 DIAGNOSIS — I10 Essential (primary) hypertension: Secondary | ICD-10-CM | POA: Diagnosis not present

## 2017-09-11 DIAGNOSIS — J189 Pneumonia, unspecified organism: Secondary | ICD-10-CM | POA: Diagnosis not present

## 2017-09-11 DIAGNOSIS — J449 Chronic obstructive pulmonary disease, unspecified: Secondary | ICD-10-CM | POA: Diagnosis not present

## 2017-09-11 DIAGNOSIS — J159 Unspecified bacterial pneumonia: Secondary | ICD-10-CM | POA: Diagnosis not present

## 2017-09-11 DIAGNOSIS — I5032 Chronic diastolic (congestive) heart failure: Secondary | ICD-10-CM | POA: Diagnosis not present

## 2017-09-11 DIAGNOSIS — I5020 Unspecified systolic (congestive) heart failure: Secondary | ICD-10-CM | POA: Diagnosis not present

## 2017-09-11 DIAGNOSIS — M1990 Unspecified osteoarthritis, unspecified site: Secondary | ICD-10-CM | POA: Diagnosis not present

## 2017-09-16 DIAGNOSIS — I5020 Unspecified systolic (congestive) heart failure: Secondary | ICD-10-CM | POA: Diagnosis not present

## 2017-09-16 DIAGNOSIS — J159 Unspecified bacterial pneumonia: Secondary | ICD-10-CM | POA: Diagnosis not present

## 2017-09-16 DIAGNOSIS — E039 Hypothyroidism, unspecified: Secondary | ICD-10-CM | POA: Diagnosis not present

## 2017-09-17 DIAGNOSIS — I5020 Unspecified systolic (congestive) heart failure: Secondary | ICD-10-CM | POA: Diagnosis not present

## 2017-09-17 DIAGNOSIS — G629 Polyneuropathy, unspecified: Secondary | ICD-10-CM | POA: Diagnosis not present

## 2017-09-17 DIAGNOSIS — J449 Chronic obstructive pulmonary disease, unspecified: Secondary | ICD-10-CM | POA: Diagnosis not present

## 2017-09-17 DIAGNOSIS — M1990 Unspecified osteoarthritis, unspecified site: Secondary | ICD-10-CM | POA: Diagnosis not present

## 2017-09-25 DIAGNOSIS — E119 Type 2 diabetes mellitus without complications: Secondary | ICD-10-CM | POA: Diagnosis not present

## 2017-09-30 DIAGNOSIS — J159 Unspecified bacterial pneumonia: Secondary | ICD-10-CM | POA: Diagnosis not present

## 2017-09-30 DIAGNOSIS — E119 Type 2 diabetes mellitus without complications: Secondary | ICD-10-CM | POA: Diagnosis not present

## 2017-10-02 DIAGNOSIS — E039 Hypothyroidism, unspecified: Secondary | ICD-10-CM | POA: Diagnosis not present

## 2017-10-02 DIAGNOSIS — E119 Type 2 diabetes mellitus without complications: Secondary | ICD-10-CM | POA: Diagnosis not present

## 2017-10-02 DIAGNOSIS — I5020 Unspecified systolic (congestive) heart failure: Secondary | ICD-10-CM | POA: Diagnosis not present

## 2017-10-02 DIAGNOSIS — J449 Chronic obstructive pulmonary disease, unspecified: Secondary | ICD-10-CM | POA: Diagnosis not present

## 2017-10-03 DIAGNOSIS — Z7984 Long term (current) use of oral hypoglycemic drugs: Secondary | ICD-10-CM | POA: Diagnosis not present

## 2017-10-03 DIAGNOSIS — I110 Hypertensive heart disease with heart failure: Secondary | ICD-10-CM | POA: Diagnosis not present

## 2017-10-03 DIAGNOSIS — J189 Pneumonia, unspecified organism: Secondary | ICD-10-CM | POA: Diagnosis not present

## 2017-10-03 DIAGNOSIS — E119 Type 2 diabetes mellitus without complications: Secondary | ICD-10-CM | POA: Diagnosis not present

## 2017-10-03 DIAGNOSIS — I5020 Unspecified systolic (congestive) heart failure: Secondary | ICD-10-CM | POA: Diagnosis not present

## 2017-10-03 DIAGNOSIS — R69 Illness, unspecified: Secondary | ICD-10-CM | POA: Diagnosis not present

## 2017-10-07 DIAGNOSIS — I110 Hypertensive heart disease with heart failure: Secondary | ICD-10-CM | POA: Diagnosis not present

## 2017-10-07 DIAGNOSIS — R69 Illness, unspecified: Secondary | ICD-10-CM | POA: Diagnosis not present

## 2017-10-07 DIAGNOSIS — I5020 Unspecified systolic (congestive) heart failure: Secondary | ICD-10-CM | POA: Diagnosis not present

## 2017-10-07 DIAGNOSIS — E119 Type 2 diabetes mellitus without complications: Secondary | ICD-10-CM | POA: Diagnosis not present

## 2017-10-07 DIAGNOSIS — J189 Pneumonia, unspecified organism: Secondary | ICD-10-CM | POA: Diagnosis not present

## 2017-10-07 DIAGNOSIS — Z7984 Long term (current) use of oral hypoglycemic drugs: Secondary | ICD-10-CM | POA: Diagnosis not present

## 2017-10-08 DIAGNOSIS — R69 Illness, unspecified: Secondary | ICD-10-CM | POA: Diagnosis not present

## 2017-10-08 DIAGNOSIS — I110 Hypertensive heart disease with heart failure: Secondary | ICD-10-CM | POA: Diagnosis not present

## 2017-10-08 DIAGNOSIS — I5020 Unspecified systolic (congestive) heart failure: Secondary | ICD-10-CM | POA: Diagnosis not present

## 2017-10-08 DIAGNOSIS — E119 Type 2 diabetes mellitus without complications: Secondary | ICD-10-CM | POA: Diagnosis not present

## 2017-10-08 DIAGNOSIS — Z7984 Long term (current) use of oral hypoglycemic drugs: Secondary | ICD-10-CM | POA: Diagnosis not present

## 2017-10-08 DIAGNOSIS — J189 Pneumonia, unspecified organism: Secondary | ICD-10-CM | POA: Diagnosis not present

## 2017-10-10 DIAGNOSIS — E119 Type 2 diabetes mellitus without complications: Secondary | ICD-10-CM | POA: Diagnosis not present

## 2017-10-10 DIAGNOSIS — I110 Hypertensive heart disease with heart failure: Secondary | ICD-10-CM | POA: Diagnosis not present

## 2017-10-10 DIAGNOSIS — Z7984 Long term (current) use of oral hypoglycemic drugs: Secondary | ICD-10-CM | POA: Diagnosis not present

## 2017-10-10 DIAGNOSIS — R69 Illness, unspecified: Secondary | ICD-10-CM | POA: Diagnosis not present

## 2017-10-10 DIAGNOSIS — I5020 Unspecified systolic (congestive) heart failure: Secondary | ICD-10-CM | POA: Diagnosis not present

## 2017-10-10 DIAGNOSIS — J189 Pneumonia, unspecified organism: Secondary | ICD-10-CM | POA: Diagnosis not present

## 2017-10-14 ENCOUNTER — Ambulatory Visit (INDEPENDENT_AMBULATORY_CARE_PROVIDER_SITE_OTHER): Payer: Medicare HMO | Admitting: Family

## 2017-10-14 ENCOUNTER — Ambulatory Visit (INDEPENDENT_AMBULATORY_CARE_PROVIDER_SITE_OTHER): Payer: Medicare HMO

## 2017-10-14 ENCOUNTER — Encounter: Payer: Self-pay | Admitting: Family

## 2017-10-14 VITALS — BP 139/78 | HR 80 | Temp 96.8°F | Ht 70.0 in | Wt 177.0 lb

## 2017-10-14 DIAGNOSIS — F411 Generalized anxiety disorder: Secondary | ICD-10-CM

## 2017-10-14 DIAGNOSIS — R918 Other nonspecific abnormal finding of lung field: Secondary | ICD-10-CM | POA: Diagnosis not present

## 2017-10-14 DIAGNOSIS — E1143 Type 2 diabetes mellitus with diabetic autonomic (poly)neuropathy: Secondary | ICD-10-CM | POA: Diagnosis not present

## 2017-10-14 DIAGNOSIS — R69 Illness, unspecified: Secondary | ICD-10-CM | POA: Diagnosis not present

## 2017-10-14 DIAGNOSIS — Z09 Encounter for follow-up examination after completed treatment for conditions other than malignant neoplasm: Secondary | ICD-10-CM

## 2017-10-14 DIAGNOSIS — E039 Hypothyroidism, unspecified: Secondary | ICD-10-CM | POA: Diagnosis not present

## 2017-10-14 DIAGNOSIS — J189 Pneumonia, unspecified organism: Secondary | ICD-10-CM | POA: Diagnosis not present

## 2017-10-14 DIAGNOSIS — F4321 Adjustment disorder with depressed mood: Secondary | ICD-10-CM

## 2017-10-14 DIAGNOSIS — G6289 Other specified polyneuropathies: Secondary | ICD-10-CM | POA: Diagnosis not present

## 2017-10-14 LAB — BAYER DCA HB A1C WAIVED: HB A1C (BAYER DCA - WAIVED): 5.9 % (ref <7.0)

## 2017-10-14 MED ORDER — HYDROCODONE-ACETAMINOPHEN 5-325 MG PO TABS: 1.0000 | ORAL_TABLET | Freq: Four times a day (QID) | ORAL | 0 refills | Status: DC | PRN

## 2017-10-14 MED ORDER — ALPRAZOLAM 0.5 MG PO TABS: 0.5000 mg | ORAL_TABLET | Freq: Every evening | ORAL | 1 refills | Status: DC | PRN

## 2017-10-14 NOTE — Patient Instructions (Signed)
Coping With Loss, Adult People experience loss in many different ways throughout their lives. Events such as moving, changing jobs, and losing friends can create a sense of loss. The loss may be as serious as a major health change, divorce, death of a pet, or death of a loved one. All of these types of loss are likely to create a physical and emotional reaction known as grief. Grief is the result of a major change or an absence of something or someone that you count on. Grief is a normal reaction to loss. How to recognize changes A variety of factors can affect your grieving experience, including:  The nature of your loss.  Your relationship to what or whom you lost.  Your understanding of grief and how to cope with it.  Your support system.  The way that you deal with your grief will affect your ability to function as you normally do. When you are grieving, you may experience:  Numbness, shock, sadness, anxiety, anger, denial, and guilt.  Thoughts about death.  Unexpected crying.  A physical sensation of emptiness in your gut.  Problems sleeping and eating.  Fatigue.  Loss of interest in normal activities.  Dreaming about or imagining seeing the person who died.  A need to remember what or whom you lost.  Difficulty thinking about anything other than your loss for a period of time.  Relief. If you have been expecting the loss for a while, you may feel a sense of relief when it happens.  Where to find support To get support for coping with loss:  Ask your health care provider for help and recommendations, such as grief counseling or therapy.  Think about joining a support group for people who are coping with loss.  Follow these instructions at home:  Be patient with yourself and others. Allow the grieving process to happen, and remember that grieving takes time. ? It is likely that you may never feel completely done with some grief. You may find a way to move on while  still cherishing memories and feelings about your loss. ? Accepting your loss is a process. It can take months or longer to adjust.  Express your feelings in healthy ways, such as: ? Talking with others about your loss. It may be helpful to find others who have had a similar loss, such as a support group. ? Writing down your feelings in a journal. ? Doing physical activities to release stress and emotional energy. ? Doing creative activities like painting, sculpting, or playing or listening to music. ? Practicing resilience. This is the ability to recover and adjust after facing challenges. Reading some resources that encourage resilience may help you to learn ways to practice those behaviors.  Keep to your normal routine as much as possible. If you have trouble focusing or doing normal activities, it is acceptable to take some time away from your normal routine.  Spend time with friends and loved ones.  Eat a healthy diet, get plenty of sleep, and rest when you feel tired. Where to find more information: You can find more information about coping with loss from:  American Society of Clinical Oncology: www.cancer.net  American Psychological Association: www.apa.org  Contact a health care provider if:  Your grief is extreme and keeps getting worse.  You have ongoing grief that does not improve.  Your body shows symptoms of grief, such as illness.  You feel depressed, anxious, or lonely. Get help right away if:  You have   thoughts about hurting yourself or others. If you ever feel like you may hurt yourself or others, or have thoughts about taking your own life, get help right away. You can go to your nearest emergency department or call:  Your local emergency services (911 in the [place]  A suicide crisis helpline, such as the National Suicide Prevention Lifeline at 1-800-273-8255. This is open 24 hours a day.  Summary  Grief is a normal part of experiencing a loss. It is the  result of a major change or an absence of something or someone that you count on.  The depth of grief and the period of recovery depend on the type of loss as well as your ability to adjust to the change and process your feelings.  Processing grief requires patience and a willingness to accept your feelings and talk about your loss with people who are supportive.  It is important to find resources that work for you and to realize that we are all different when it comes to grief. There is not one single grieving process that works for everyone in the same way.  Be aware that when grief becomes extreme, it can lead to more severe issues like isolation, depression, anxiety, or suicidal thoughts. Talk with your health care provider if you have any of these issues. This information is not intended to replace advice given to you by your health care provider. Make sure you discuss any questions you have with your health care provider. Document Released: 10/11/2016 Document Revised: 10/11/2016 Document Reviewed: 10/11/2016 Elsevier Interactive Patient Education  2018 Elsevier Inc.  

## 2017-10-14 NOTE — Progress Notes (Signed)
Subjective:    Patient ID: [name] [name], female    DOB: 1932-06-24, 84 y.o.   MRN: [id number]  Chief Complaint  Patient presents with  . follow up from rehabilitation    PT presents to the office today for follow up with SNF. Pt was admitted to [facility] with bilateral  Pneumonia in April 2019. Pt was discharged to a SNF two weeks ago and been staying with her daughter. Her husband passed away while she was in the SNF. She is trying to cope, but is having a hard time. Diabetes  She presents for her follow-up diabetic visit. She has type 2 diabetes mellitus. There are no hypoglycemic associated symptoms. Associated symptoms include fatigue and weight loss. Symptoms are stable. She is following a generally healthy diet. An ACE inhibitor/angiotensin II receptor blocker is being taken.  Thyroid Problem  Presents for follow-up visit. Symptoms include dry skin, fatigue and weight loss. The symptoms have been worsening.      Review of Systems  Constitutional: Positive for fatigue and weight loss.  All other systems reviewed and are negative.      Objective:   Physical Exam  Constitutional: She is oriented to person, place, and time. She appears well-developed and well-nourished. No distress.  HENT:  Head: Normocephalic and atraumatic.  Right Ear: External ear normal.  Left Ear: External ear normal.  Mouth/Throat: Oropharynx is clear and moist.  Eyes: Pupils are equal, round, and reactive to light.  Neck: Normal range of motion. Neck supple. No thyromegaly present.  Cardiovascular: Normal rate, regular rhythm, normal heart sounds and intact distal pulses.  No murmur heard. Pulmonary/Chest: Effort normal and breath sounds normal. No respiratory distress. She has no wheezes.  Abdominal: Soft. Bowel sounds are normal. She exhibits no distension. There is no tenderness.  Musculoskeletal: She exhibits no edema or tenderness.  Generalized weakness, using cane to walk  Neurological:  She is alert and oriented to person, place, and time. She has normal reflexes. No cranial nerve deficit.  Skin: Skin is warm and dry.  Psychiatric: She has a normal mood and affect. Her behavior is normal. Judgment and thought content normal.  Vitals reviewed.     BP 139/78   Pulse 80   Temp (!) 96.8 F (36 C) (Oral)   Ht '5\' 10"'$  (1.778 m)   Wt 177 lb (80.3 kg)   BMI 25.40 kg/m      Assessment & Plan:  [name] [name] comes in today with chief complaint of follow up from rehabilitation   Diagnosis and orders addressed:  1. Hypothyroidism, unspecified type - CMP14+EGFR - CBC with Differential/Platelet - TSH  2. Type II diabetes mellitus with peripheral autonomic neuropathy (HCC) - CMP14+EGFR - CBC with Differential/Platelet - Bayer DCA Hb A1c Waived  3. Hospital discharge follow-up - CMP14+EGFR - CBC with Differential/Platelet - DG Chest 2 View; Future  4. Community acquired pneumonia, unspecified laterality - CMP14+EGFR - CBC with Differential/Platelet - DG Chest 2 View; Future  5. Grieving  6. Other polyneuropath - HYDROcodone-acetaminophen (NORCO/VICODIN) 5-325 MG tablet; Take 1 tablet by mouth every 6 (six) hours as needed for moderate pain.  Dispense: 20 tablet; Refill: 0  7. GAD (generalized anxiety disorder) - ALPRAZolam (XANAX) 0.5 MG tablet; Take 1 tablet (0.5 mg total) by mouth at bedtime as needed for anxiety.  Dispense: 30 tablet; Refill: 1   Labs pending Health Maintenance reviewed Diet and exercise encouraged  Follow up plan: 1 month chronic follow up  Evelina Dun, FNP

## 2017-10-15 ENCOUNTER — Other Ambulatory Visit: Payer: Self-pay | Admitting: Family

## 2017-10-15 DIAGNOSIS — Z7984 Long term (current) use of oral hypoglycemic drugs: Secondary | ICD-10-CM | POA: Diagnosis not present

## 2017-10-15 DIAGNOSIS — I700 Atherosclerosis of aorta: Secondary | ICD-10-CM | POA: Insufficient documentation

## 2017-10-15 DIAGNOSIS — E119 Type 2 diabetes mellitus without complications: Secondary | ICD-10-CM | POA: Diagnosis not present

## 2017-10-15 DIAGNOSIS — I110 Hypertensive heart disease with heart failure: Secondary | ICD-10-CM | POA: Diagnosis not present

## 2017-10-15 DIAGNOSIS — J189 Pneumonia, unspecified organism: Secondary | ICD-10-CM | POA: Diagnosis not present

## 2017-10-15 DIAGNOSIS — R69 Illness, unspecified: Secondary | ICD-10-CM | POA: Diagnosis not present

## 2017-10-15 DIAGNOSIS — I5020 Unspecified systolic (congestive) heart failure: Secondary | ICD-10-CM | POA: Diagnosis not present

## 2017-10-15 LAB — CBC WITH DIFFERENTIAL/PLATELET
BASOS ABS: 0 10*3/uL (ref 0.0–0.2)
Basos: 0 %
EOS (ABSOLUTE): 0.1 10*3/uL (ref 0.0–0.4)
Eos: 1 %
HEMATOCRIT: 37.2 % (ref 34.0–46.6)
HEMOGLOBIN: 11.4 g/dL (ref 11.1–15.9)
IMMATURE GRANS (ABS): 0 10*3/uL (ref 0.0–0.1)
Immature Granulocytes: 0 %
LYMPHS ABS: 2.5 10*3/uL (ref 0.7–3.1)
Lymphs: 37 %
MCH: 27.9 pg (ref 26.6–33.0)
MCHC: 30.6 g/dL — AB (ref 31.5–35.7)
MCV: 91 fL (ref 79–97)
MONOCYTES: 7 %
Monocytes Absolute: 0.5 10*3/uL (ref 0.1–0.9)
NEUTROS ABS: 3.8 10*3/uL (ref 1.4–7.0)
Neutrophils: 55 %
Platelets: 293 10*3/uL (ref 150–379)
RBC: 4.09 x10E6/uL (ref 3.77–5.28)
RDW: 15.3 % (ref 12.3–15.4)
WBC: 6.9 10*3/uL (ref 3.4–10.8)

## 2017-10-15 LAB — CMP14+EGFR
A/G RATIO: 1.6 (ref 1.2–2.2)
ALBUMIN: 3.9 g/dL (ref 3.5–4.7)
ALT: 16 IU/L (ref 0–32)
AST: 30 IU/L (ref 0–40)
Alkaline Phosphatase: 65 IU/L (ref 39–117)
BILIRUBIN TOTAL: 0.4 mg/dL (ref 0.0–1.2)
BUN / CREAT RATIO: 13 (ref 12–28)
BUN: 13 mg/dL (ref 8–27)
CHLORIDE: 99 mmol/L (ref 96–106)
CO2: 21 mmol/L (ref 20–29)
Calcium: 9.1 mg/dL (ref 8.7–10.3)
Creatinine, Ser: 1.04 mg/dL — ABNORMAL HIGH (ref 0.57–1.00)
GFR calc non Af Amer: 50 mL/min/{1.73_m2} — ABNORMAL LOW (ref >59)
GFR, EST AFRICAN AMERICAN: 58 mL/min/{1.73_m2} — AB (ref >59)
Globulin, Total: 2.4 g/dL (ref 1.5–4.5)
Glucose: 135 mg/dL — ABNORMAL HIGH (ref 65–99)
Potassium: 5 mmol/L (ref 3.5–5.2)
Sodium: 139 mmol/L (ref 134–144)
TOTAL PROTEIN: 6.3 g/dL (ref 6.0–8.5)

## 2017-10-15 LAB — TSH: TSH: 1.6 u[IU]/mL (ref 0.450–4.500)

## 2017-10-17 DIAGNOSIS — J189 Pneumonia, unspecified organism: Secondary | ICD-10-CM | POA: Diagnosis not present

## 2017-10-17 DIAGNOSIS — I5020 Unspecified systolic (congestive) heart failure: Secondary | ICD-10-CM | POA: Diagnosis not present

## 2017-10-17 DIAGNOSIS — R69 Illness, unspecified: Secondary | ICD-10-CM | POA: Diagnosis not present

## 2017-10-17 DIAGNOSIS — Z7984 Long term (current) use of oral hypoglycemic drugs: Secondary | ICD-10-CM | POA: Diagnosis not present

## 2017-10-17 DIAGNOSIS — E119 Type 2 diabetes mellitus without complications: Secondary | ICD-10-CM | POA: Diagnosis not present

## 2017-10-17 DIAGNOSIS — I110 Hypertensive heart disease with heart failure: Secondary | ICD-10-CM | POA: Diagnosis not present

## 2017-10-21 ENCOUNTER — Inpatient Hospital Stay (HOSPITAL_COMMUNITY): Admission: RE | Admit: 2017-10-21 | Payer: Medicare HMO | Source: Ambulatory Visit

## 2017-10-22 DIAGNOSIS — I110 Hypertensive heart disease with heart failure: Secondary | ICD-10-CM | POA: Diagnosis not present

## 2017-10-22 DIAGNOSIS — R69 Illness, unspecified: Secondary | ICD-10-CM | POA: Diagnosis not present

## 2017-10-22 DIAGNOSIS — Z7984 Long term (current) use of oral hypoglycemic drugs: Secondary | ICD-10-CM | POA: Diagnosis not present

## 2017-10-22 DIAGNOSIS — J189 Pneumonia, unspecified organism: Secondary | ICD-10-CM | POA: Diagnosis not present

## 2017-10-22 DIAGNOSIS — E119 Type 2 diabetes mellitus without complications: Secondary | ICD-10-CM | POA: Diagnosis not present

## 2017-10-22 DIAGNOSIS — I5020 Unspecified systolic (congestive) heart failure: Secondary | ICD-10-CM | POA: Diagnosis not present

## 2017-10-23 ENCOUNTER — Ambulatory Visit (INDEPENDENT_AMBULATORY_CARE_PROVIDER_SITE_OTHER): Payer: Medicare HMO

## 2017-10-23 ENCOUNTER — Telehealth: Payer: Self-pay | Admitting: Family

## 2017-10-23 ENCOUNTER — Other Ambulatory Visit: Payer: Self-pay | Admitting: *Deleted

## 2017-10-23 DIAGNOSIS — R69 Illness, unspecified: Secondary | ICD-10-CM | POA: Diagnosis not present

## 2017-10-23 DIAGNOSIS — J189 Pneumonia, unspecified organism: Secondary | ICD-10-CM | POA: Diagnosis not present

## 2017-10-23 DIAGNOSIS — E119 Type 2 diabetes mellitus without complications: Secondary | ICD-10-CM

## 2017-10-23 DIAGNOSIS — F329 Major depressive disorder, single episode, unspecified: Secondary | ICD-10-CM | POA: Diagnosis not present

## 2017-10-23 DIAGNOSIS — I5020 Unspecified systolic (congestive) heart failure: Secondary | ICD-10-CM | POA: Diagnosis not present

## 2017-10-23 DIAGNOSIS — I110 Hypertensive heart disease with heart failure: Secondary | ICD-10-CM | POA: Diagnosis not present

## 2017-10-23 DIAGNOSIS — Z7984 Long term (current) use of oral hypoglycemic drugs: Secondary | ICD-10-CM

## 2017-10-24 ENCOUNTER — Other Ambulatory Visit: Payer: Self-pay | Admitting: Family

## 2017-10-24 DIAGNOSIS — E119 Type 2 diabetes mellitus without complications: Secondary | ICD-10-CM | POA: Diagnosis not present

## 2017-10-24 DIAGNOSIS — R69 Illness, unspecified: Secondary | ICD-10-CM | POA: Diagnosis not present

## 2017-10-24 DIAGNOSIS — I5020 Unspecified systolic (congestive) heart failure: Secondary | ICD-10-CM | POA: Diagnosis not present

## 2017-10-24 DIAGNOSIS — Z7984 Long term (current) use of oral hypoglycemic drugs: Secondary | ICD-10-CM | POA: Diagnosis not present

## 2017-10-24 DIAGNOSIS — I110 Hypertensive heart disease with heart failure: Secondary | ICD-10-CM | POA: Diagnosis not present

## 2017-10-24 DIAGNOSIS — J189 Pneumonia, unspecified organism: Secondary | ICD-10-CM | POA: Diagnosis not present

## 2017-10-24 NOTE — Telephone Encounter (Signed)
Yes she can continue her metformin. We had stopped her glimepiride.

## 2017-10-24 NOTE — Telephone Encounter (Signed)
Pt notified to take Metformin Verbalizes understanding

## 2017-10-24 NOTE — Telephone Encounter (Signed)
Please advise if she should continue with metformin.

## 2017-10-25 ENCOUNTER — Encounter: Payer: Self-pay | Admitting: *Deleted

## 2017-10-25 ENCOUNTER — Ambulatory Visit (HOSPITAL_COMMUNITY): Admission: RE | Admit: 2017-10-25 | Payer: Medicare HMO | Source: Ambulatory Visit | Admitting: Ophthalmology

## 2017-10-25 ENCOUNTER — Encounter (HOSPITAL_COMMUNITY): Admission: RE | Payer: Self-pay | Source: Ambulatory Visit

## 2017-10-25 SURGERY — PHACOEMULSIFICATION, CATARACT, WITH IOL INSERTION
Anesthesia: Monitor Anesthesia Care | Laterality: Right

## 2017-10-29 DIAGNOSIS — I5020 Unspecified systolic (congestive) heart failure: Secondary | ICD-10-CM | POA: Diagnosis not present

## 2017-10-29 DIAGNOSIS — E119 Type 2 diabetes mellitus without complications: Secondary | ICD-10-CM | POA: Diagnosis not present

## 2017-10-29 DIAGNOSIS — Z7984 Long term (current) use of oral hypoglycemic drugs: Secondary | ICD-10-CM | POA: Diagnosis not present

## 2017-10-29 DIAGNOSIS — J189 Pneumonia, unspecified organism: Secondary | ICD-10-CM | POA: Diagnosis not present

## 2017-10-29 DIAGNOSIS — I110 Hypertensive heart disease with heart failure: Secondary | ICD-10-CM | POA: Diagnosis not present

## 2017-10-29 DIAGNOSIS — R69 Illness, unspecified: Secondary | ICD-10-CM | POA: Diagnosis not present

## 2017-10-31 DIAGNOSIS — I110 Hypertensive heart disease with heart failure: Secondary | ICD-10-CM | POA: Diagnosis not present

## 2017-10-31 DIAGNOSIS — I5020 Unspecified systolic (congestive) heart failure: Secondary | ICD-10-CM | POA: Diagnosis not present

## 2017-10-31 DIAGNOSIS — Z7984 Long term (current) use of oral hypoglycemic drugs: Secondary | ICD-10-CM | POA: Diagnosis not present

## 2017-10-31 DIAGNOSIS — J189 Pneumonia, unspecified organism: Secondary | ICD-10-CM | POA: Diagnosis not present

## 2017-10-31 DIAGNOSIS — R69 Illness, unspecified: Secondary | ICD-10-CM | POA: Diagnosis not present

## 2017-10-31 DIAGNOSIS — E119 Type 2 diabetes mellitus without complications: Secondary | ICD-10-CM | POA: Diagnosis not present

## 2017-11-01 ENCOUNTER — Other Ambulatory Visit: Payer: Self-pay | Admitting: Family

## 2017-11-01 DIAGNOSIS — G6289 Other specified polyneuropathies: Secondary | ICD-10-CM

## 2017-11-05 DIAGNOSIS — I110 Hypertensive heart disease with heart failure: Secondary | ICD-10-CM | POA: Diagnosis not present

## 2017-11-05 DIAGNOSIS — Z7984 Long term (current) use of oral hypoglycemic drugs: Secondary | ICD-10-CM | POA: Diagnosis not present

## 2017-11-05 DIAGNOSIS — I5020 Unspecified systolic (congestive) heart failure: Secondary | ICD-10-CM | POA: Diagnosis not present

## 2017-11-05 DIAGNOSIS — R69 Illness, unspecified: Secondary | ICD-10-CM | POA: Diagnosis not present

## 2017-11-05 DIAGNOSIS — E119 Type 2 diabetes mellitus without complications: Secondary | ICD-10-CM | POA: Diagnosis not present

## 2017-11-05 DIAGNOSIS — J189 Pneumonia, unspecified organism: Secondary | ICD-10-CM | POA: Diagnosis not present

## 2017-11-07 DIAGNOSIS — I5020 Unspecified systolic (congestive) heart failure: Secondary | ICD-10-CM | POA: Diagnosis not present

## 2017-11-07 DIAGNOSIS — E119 Type 2 diabetes mellitus without complications: Secondary | ICD-10-CM | POA: Diagnosis not present

## 2017-11-07 DIAGNOSIS — R69 Illness, unspecified: Secondary | ICD-10-CM | POA: Diagnosis not present

## 2017-11-07 DIAGNOSIS — Z7984 Long term (current) use of oral hypoglycemic drugs: Secondary | ICD-10-CM | POA: Diagnosis not present

## 2017-11-07 DIAGNOSIS — I110 Hypertensive heart disease with heart failure: Secondary | ICD-10-CM | POA: Diagnosis not present

## 2017-11-07 DIAGNOSIS — J189 Pneumonia, unspecified organism: Secondary | ICD-10-CM | POA: Diagnosis not present

## 2017-11-12 DIAGNOSIS — I5020 Unspecified systolic (congestive) heart failure: Secondary | ICD-10-CM | POA: Diagnosis not present

## 2017-11-12 DIAGNOSIS — E119 Type 2 diabetes mellitus without complications: Secondary | ICD-10-CM | POA: Diagnosis not present

## 2017-11-12 DIAGNOSIS — J189 Pneumonia, unspecified organism: Secondary | ICD-10-CM | POA: Diagnosis not present

## 2017-11-12 DIAGNOSIS — I110 Hypertensive heart disease with heart failure: Secondary | ICD-10-CM | POA: Diagnosis not present

## 2017-11-12 DIAGNOSIS — Z7984 Long term (current) use of oral hypoglycemic drugs: Secondary | ICD-10-CM | POA: Diagnosis not present

## 2017-11-12 DIAGNOSIS — R69 Illness, unspecified: Secondary | ICD-10-CM | POA: Diagnosis not present

## 2017-11-13 ENCOUNTER — Ambulatory Visit (INDEPENDENT_AMBULATORY_CARE_PROVIDER_SITE_OTHER): Payer: Medicare HMO | Admitting: Family

## 2017-11-13 ENCOUNTER — Encounter: Payer: Self-pay | Admitting: Family

## 2017-11-13 DIAGNOSIS — F339 Major depressive disorder, recurrent, unspecified: Secondary | ICD-10-CM

## 2017-11-13 DIAGNOSIS — F411 Generalized anxiety disorder: Secondary | ICD-10-CM

## 2017-11-13 DIAGNOSIS — R69 Illness, unspecified: Secondary | ICD-10-CM | POA: Diagnosis not present

## 2017-11-13 DIAGNOSIS — E1143 Type 2 diabetes mellitus with diabetic autonomic (poly)neuropathy: Secondary | ICD-10-CM | POA: Diagnosis not present

## 2017-11-13 MED ORDER — SERTRALINE HCL 100 MG PO TABS: 100.0000 mg | ORAL_TABLET | Freq: Every day | ORAL | 5 refills | Status: DC

## 2017-11-13 MED ORDER — ALPRAZOLAM 0.5 MG PO TABS: 0.5000 mg | ORAL_TABLET | Freq: Every evening | ORAL | 1 refills | Status: DC | PRN

## 2017-11-13 NOTE — Progress Notes (Signed)
Subjective:    Patient ID: [name] [name], female    DOB: 27-Oct-1932, 84 y.o.   MRN: [id number]  Chief Complaint  Patient presents with  . depression recheck    Depression         This is a chronic problem.  The current episode started more than 1 year ago.   The onset quality is gradual.   The problem occurs intermittently.  The problem has been waxing and waning since onset.  Associated symptoms include helplessness, hopelessness, restlessness and sad.     The symptoms are aggravated by family issues.  Past treatments include SSRIs - Selective serotonin reuptake inhibitors.  Past medical history includes anxiety.   Diabetes  She presents for her follow-up diabetic visit. She has type 2 diabetes mellitus. Her disease course has been stable. Hypoglycemia symptoms include nervousness/anxiousness. Associated symptoms include foot paresthesias and visual change. Symptoms are stable. Diabetic complications include heart disease. Risk factors for coronary artery disease include dyslipidemia, diabetes mellitus, family history, hypertension and female sex. She is following a generally healthy diet. Her overall blood glucose range is 110-130 mg/dl.  Anxiety  Presents for follow-up visit. Symptoms include depressed mood, excessive worry, irritability, nervous/anxious behavior and restlessness. Symptoms occur most days. The severity of symptoms is mild.        Review of Systems  Constitutional: Positive for irritability.  Psychiatric/Behavioral: Positive for depression. The patient is nervous/anxious.   All other systems reviewed and are negative.      Objective:   Physical Exam  Constitutional: She is oriented to person, place, and time. She appears well-developed and well-nourished. No distress.  HENT:  Head: Normocephalic and atraumatic.  Right Ear: External ear normal.  Left Ear: External ear normal.  Mouth/Throat: Oropharynx is clear and moist.  Eyes: Pupils are equal, round, and  reactive to light.  Neck: Normal range of motion. Neck supple. No thyromegaly present.  Cardiovascular: Normal rate, regular rhythm, normal heart sounds and intact distal pulses.  No murmur heard. Pulmonary/Chest: Effort normal and breath sounds normal. No respiratory distress. She has no wheezes.  Abdominal: Soft. Bowel sounds are normal. She exhibits no distension. There is no tenderness.  Musculoskeletal: Normal range of motion. She exhibits no edema or tenderness.  Neurological: She is alert and oriented to person, place, and time. She has normal reflexes. No cranial nerve deficit.  Skin: Skin is warm and dry.  Psychiatric: She has a normal mood and affect. Her behavior is normal. Judgment and thought content normal.  Vitals reviewed.     BP 124/67   Pulse 63   Ht _0  (1.778 m)   Wt 184 lb 9.6 oz (83.7 kg)   BMI 26.49 kg/m      Assessment & Plan:  [name] [name] comes in today with chief complaint of depression recheck   Diagnosis and orders addressed:  1. GAD (generalized anxiety disorder) Will increase zoloft today to 100 mg from 50 mg Stress management discussed - sertraline (ZOLOFT) 100 MG tablet; Take 1 tablet (100 mg total) by mouth daily.  Dispense: 30 tablet; Refill: 5 - ALPRAZolam (XANAX) 0.5 MG tablet; Take 1 tablet (0.5 mg total) by mouth at bedtime as needed for anxiety.  Dispense: 30 tablet; Refill: 1 - CMP14+EGFR  2. Morbid obesity ([place]) - CMP14+EGFR  3. Type II diabetes mellitus with peripheral autonomic neuropathy (HCC) - CMP14+EGFR  4. Depression, recurrent ([place]) - sertraline (ZOLOFT) 100 MG tablet; Take 1 tablet (100 mg total) by mouth  daily.  Dispense: 30 tablet; Refill: 5 - CMP14+EGFR   Labs pending Health Maintenance reviewed Diet and exercise encouraged  Follow up plan: 3 months   Evelina Dun, FNP

## 2017-11-13 NOTE — Patient Instructions (Signed)
Coping With Loss, Adult People experience loss in many different ways throughout their lives. Events such as moving, changing jobs, and losing friends can create a sense of loss. The loss may be as serious as a major health change, divorce, death of a pet, or death of a loved one. All of these types of loss are likely to create a physical and emotional reaction known as grief. Grief is the result of a major change or an absence of something or someone that you count on. Grief is a normal reaction to loss. How to recognize changes A variety of factors can affect your grieving experience, including:  The nature of your loss.  Your relationship to what or whom you lost.  Your understanding of grief and how to cope with it.  Your support system.  The way that you deal with your grief will affect your ability to function as you normally do. When you are grieving, you may experience:  Numbness, shock, sadness, anxiety, anger, denial, and guilt.  Thoughts about death.  Unexpected crying.  A physical sensation of emptiness in your gut.  Problems sleeping and eating.  Fatigue.  Loss of interest in normal activities.  Dreaming about or imagining seeing the person who died.  A need to remember what or whom you lost.  Difficulty thinking about anything other than your loss for a period of time.  Relief. If you have been expecting the loss for a while, you may feel a sense of relief when it happens.  Where to find support To get support for coping with loss:  Ask your health care provider for help and recommendations, such as grief counseling or therapy.  Think about joining a support group for people who are coping with loss.  Follow these instructions at home:  Be patient with yourself and others. Allow the grieving process to happen, and remember that grieving takes time. ? It is likely that you may never feel completely done with some grief. You may find a way to move on while  still cherishing memories and feelings about your loss. ? Accepting your loss is a process. It can take months or longer to adjust.  Express your feelings in healthy ways, such as: ? Talking with others about your loss. It may be helpful to find others who have had a similar loss, such as a support group. ? Writing down your feelings in a journal. ? Doing physical activities to release stress and emotional energy. ? Doing creative activities like painting, sculpting, or playing or listening to music. ? Practicing resilience. This is the ability to recover and adjust after facing challenges. Reading some resources that encourage resilience may help you to learn ways to practice those behaviors.  Keep to your normal routine as much as possible. If you have trouble focusing or doing normal activities, it is acceptable to take some time away from your normal routine.  Spend time with friends and loved ones.  Eat a healthy diet, get plenty of sleep, and rest when you feel tired. Where to find more information: You can find more information about coping with loss from:  American Society of Clinical Oncology: www.cancer.net  American Psychological Association: www.apa.org  Contact a health care provider if:  Your grief is extreme and keeps getting worse.  You have ongoing grief that does not improve.  Your body shows symptoms of grief, such as illness.  You feel depressed, anxious, or lonely. Get help right away if:  You have   thoughts about hurting yourself or others. If you ever feel like you may hurt yourself or others, or have thoughts about taking your own life, get help right away. You can go to your nearest emergency department or call:  Your local emergency services (911 in the [place]  A suicide crisis helpline, such as the National Suicide Prevention Lifeline at 1-800-273-8255. This is open 24 hours a day.  Summary  Grief is a normal part of experiencing a loss. It is the  result of a major change or an absence of something or someone that you count on.  The depth of grief and the period of recovery depend on the type of loss as well as your ability to adjust to the change and process your feelings.  Processing grief requires patience and a willingness to accept your feelings and talk about your loss with people who are supportive.  It is important to find resources that work for you and to realize that we are all different when it comes to grief. There is not one single grieving process that works for everyone in the same way.  Be aware that when grief becomes extreme, it can lead to more severe issues like isolation, depression, anxiety, or suicidal thoughts. Talk with your health care provider if you have any of these issues. This information is not intended to replace advice given to you by your health care provider. Make sure you discuss any questions you have with your health care provider. Document Released: 10/11/2016 Document Revised: 10/11/2016 Document Reviewed: 10/11/2016 Elsevier Interactive Patient Education  2018 Elsevier Inc.  

## 2017-11-14 LAB — CMP14+EGFR
ALT: 19 IU/L (ref 0–32)
AST: 33 IU/L (ref 0–40)
Albumin/Globulin Ratio: 1.6 (ref 1.2–2.2)
Albumin: 4.1 g/dL (ref 3.5–4.7)
Alkaline Phosphatase: 84 IU/L (ref 39–117)
BILIRUBIN TOTAL: 0.3 mg/dL (ref 0.0–1.2)
BUN/Creatinine Ratio: 19 (ref 12–28)
BUN: 19 mg/dL (ref 8–27)
CALCIUM: 9.4 mg/dL (ref 8.7–10.3)
CHLORIDE: 99 mmol/L (ref 96–106)
CO2: 24 mmol/L (ref 20–29)
Creatinine, Ser: 1.02 mg/dL — ABNORMAL HIGH (ref 0.57–1.00)
GFR calc non Af Amer: 51 mL/min/{1.73_m2} — ABNORMAL LOW (ref >59)
GFR, EST AFRICAN AMERICAN: 59 mL/min/{1.73_m2} — AB (ref >59)
GLUCOSE: 127 mg/dL — AB (ref 65–99)
Globulin, Total: 2.6 g/dL (ref 1.5–4.5)
Potassium: 4.9 mmol/L (ref 3.5–5.2)
Sodium: 138 mmol/L (ref 134–144)
TOTAL PROTEIN: 6.7 g/dL (ref 6.0–8.5)

## 2017-11-15 ENCOUNTER — Ambulatory Visit: Payer: Medicare HMO | Admitting: Family

## 2017-11-16 ENCOUNTER — Other Ambulatory Visit: Payer: Self-pay | Admitting: Family

## 2017-11-18 ENCOUNTER — Other Ambulatory Visit: Payer: Self-pay | Admitting: Family

## 2017-12-11 ENCOUNTER — Other Ambulatory Visit: Payer: Self-pay | Admitting: Family

## 2017-12-18 DIAGNOSIS — R69 Illness, unspecified: Secondary | ICD-10-CM | POA: Diagnosis not present

## 2017-12-27 ENCOUNTER — Other Ambulatory Visit: Payer: Self-pay | Admitting: Family

## 2017-12-30 DIAGNOSIS — R69 Illness, unspecified: Secondary | ICD-10-CM | POA: Diagnosis not present

## 2018-01-08 ENCOUNTER — Ambulatory Visit (INDEPENDENT_AMBULATORY_CARE_PROVIDER_SITE_OTHER): Payer: Medicare HMO | Admitting: Family

## 2018-01-08 ENCOUNTER — Encounter: Payer: Self-pay | Admitting: Family

## 2018-01-08 VITALS — BP 166/81 | HR 62 | Temp 97.2°F | Ht 70.0 in | Wt 179.8 lb

## 2018-01-08 DIAGNOSIS — N3001 Acute cystitis with hematuria: Secondary | ICD-10-CM | POA: Diagnosis not present

## 2018-01-08 DIAGNOSIS — R059 Cough, unspecified: Secondary | ICD-10-CM

## 2018-01-08 DIAGNOSIS — R109 Unspecified abdominal pain: Secondary | ICD-10-CM | POA: Diagnosis not present

## 2018-01-08 DIAGNOSIS — R05 Cough: Secondary | ICD-10-CM | POA: Diagnosis not present

## 2018-01-08 LAB — URINALYSIS, COMPLETE
BILIRUBIN UA: NEGATIVE
GLUCOSE, UA: NEGATIVE
NITRITE UA: NEGATIVE
PH UA: 7 (ref 5.0–7.5)
Specific Gravity, UA: 1.02 (ref 1.005–1.030)
UUROB: 0.2 mg/dL (ref 0.2–1.0)

## 2018-01-08 LAB — MICROSCOPIC EXAMINATION

## 2018-01-08 MED ORDER — DOXYCYCLINE HYCLATE 100 MG PO TABS: 100.0000 mg | ORAL_TABLET | Freq: Two times a day (BID) | ORAL | 0 refills | Status: DC

## 2018-01-08 NOTE — Progress Notes (Signed)
   Subjective:    Patient ID: [name] [name], female    DOB: Oct 29, 1932, 84 y.o.   MRN: [id number]  Chief Complaint  Patient presents with  . cough and congestion    Cough  The current episode started in the past 7 days. The problem has been waxing and waning. The problem occurs every few minutes.  Urinary Frequency   This is a new problem. The current episode started in the past 7 days. The problem occurs every urination. The pain is mild. Associated symptoms include flank pain, frequency, nausea and urgency. Pertinent negatives include no hematuria or hesitancy. She has tried increased fluids for the symptoms. The treatment provided mild relief.      Review of Systems  Respiratory: Positive for cough.   Gastrointestinal: Positive for nausea.  Genitourinary: Positive for flank pain, frequency and urgency. Negative for hematuria and hesitancy.  All other systems reviewed and are negative.      Objective:   Physical Exam  Constitutional: She is oriented to person, place, and time. She appears well-developed and well-nourished. No distress.  HENT:  Head: Normocephalic and atraumatic.  Right Ear: External ear normal.  Left Ear: External ear normal.  Mouth/Throat: Oropharynx is clear and moist.  Eyes: Pupils are equal, round, and reactive to light.  Neck: Normal range of motion. Neck supple. No thyromegaly present.  Cardiovascular: Normal rate, regular rhythm, normal heart sounds and intact distal pulses.  No murmur heard. Pulmonary/Chest: Effort normal and breath sounds normal. No respiratory distress. She has no wheezes.  Abdominal: Soft. Bowel sounds are normal. She exhibits no distension. There is no tenderness.  Musculoskeletal: Normal range of motion. She exhibits no edema or tenderness.  Neurological: She is alert and oriented to person, place, and time. She has normal reflexes. No cranial nerve deficit.  Skin: Skin is warm and dry.  Psychiatric: She has a normal mood and  affect. Her behavior is normal. Judgment and thought content normal.  Vitals reviewed.     BP (!) 166/81   Pulse 62   Temp (!) 97.2 F (36.2 C) (Oral)   Ht 5\' 10"  (1.778 m)   Wt 179 lb 12.8 oz (81.6 kg)   BMI 25.80 kg/m      Assessment & Plan:  Leonardo was seen today for cough and congestion.  Diagnoses and all orders for this visit:  Flank pain -     Urinalysis, Complete -     doxycycline (VIBRA-TABS) 100 MG tablet; Take 1 tablet (100 mg total) by mouth 2 (two) times daily.  Acute cystitis with hematuria -     Urine Culture -     doxycycline (VIBRA-TABS) 100 MG tablet; Take 1 tablet (100 mg total) by mouth 2 (two) times daily.  Cough -     doxycycline (VIBRA-TABS) 100 MG tablet; Take 1 tablet (100 mg total) by mouth 2 (two) times daily.   Force fluids RTO prn Culture pending   Jannifer Rodneyhristy Hawks, FNP

## 2018-01-08 NOTE — Patient Instructions (Signed)

## 2018-01-09 LAB — URINE CULTURE

## 2018-01-18 ENCOUNTER — Other Ambulatory Visit: Payer: Self-pay | Admitting: Family

## 2018-01-24 ENCOUNTER — Other Ambulatory Visit: Payer: Self-pay | Admitting: Family

## 2018-01-28 DIAGNOSIS — R69 Illness, unspecified: Secondary | ICD-10-CM | POA: Diagnosis not present

## 2018-02-11 ENCOUNTER — Other Ambulatory Visit: Payer: Self-pay | Admitting: Family

## 2018-02-11 DIAGNOSIS — F411 Generalized anxiety disorder: Secondary | ICD-10-CM

## 2018-02-20 ENCOUNTER — Other Ambulatory Visit: Payer: Self-pay | Admitting: Family

## 2018-02-21 NOTE — Telephone Encounter (Signed)
Last seen 01/08/18  [place] 05/14/17

## 2018-02-23 ENCOUNTER — Encounter (HOSPITAL_COMMUNITY): Payer: Self-pay

## 2018-02-23 ENCOUNTER — Emergency Department (HOSPITAL_COMMUNITY): Payer: Medicare HMO

## 2018-02-23 ENCOUNTER — Other Ambulatory Visit: Payer: Self-pay

## 2018-02-23 ENCOUNTER — Observation Stay (HOSPITAL_BASED_OUTPATIENT_CLINIC_OR_DEPARTMENT_OTHER)
Admission: EM | Admit: 2018-02-23 | Discharge: 2018-02-25 | Disposition: A | Discharge status: Home / Self Care | Payer: Medicare HMO | Source: Home / Self Care | Attending: Emergency Medicine | Admitting: Emergency Medicine

## 2018-02-23 DIAGNOSIS — I110 Hypertensive heart disease with heart failure: Secondary | ICD-10-CM | POA: Diagnosis not present

## 2018-02-23 DIAGNOSIS — Z79899 Other long term (current) drug therapy: Secondary | ICD-10-CM

## 2018-02-23 DIAGNOSIS — E538 Deficiency of other specified B group vitamins: Secondary | ICD-10-CM | POA: Diagnosis present

## 2018-02-23 DIAGNOSIS — F411 Generalized anxiety disorder: Secondary | ICD-10-CM | POA: Insufficient documentation

## 2018-02-23 DIAGNOSIS — R531 Weakness: Secondary | ICD-10-CM | POA: Diagnosis not present

## 2018-02-23 DIAGNOSIS — M6281 Muscle weakness (generalized): Secondary | ICD-10-CM | POA: Diagnosis not present

## 2018-02-23 DIAGNOSIS — I5043 Acute on chronic combined systolic (congestive) and diastolic (congestive) heart failure: Secondary | ICD-10-CM | POA: Diagnosis not present

## 2018-02-23 DIAGNOSIS — R52 Pain, unspecified: Secondary | ICD-10-CM | POA: Diagnosis not present

## 2018-02-23 DIAGNOSIS — R609 Edema, unspecified: Secondary | ICD-10-CM | POA: Diagnosis not present

## 2018-02-23 DIAGNOSIS — I5033 Acute on chronic diastolic (congestive) heart failure: Secondary | ICD-10-CM | POA: Diagnosis not present

## 2018-02-23 DIAGNOSIS — I679 Cerebrovascular disease, unspecified: Secondary | ICD-10-CM | POA: Diagnosis not present

## 2018-02-23 DIAGNOSIS — G4730 Sleep apnea, unspecified: Secondary | ICD-10-CM | POA: Insufficient documentation

## 2018-02-23 DIAGNOSIS — Z833 Family history of diabetes mellitus: Secondary | ICD-10-CM | POA: Diagnosis not present

## 2018-02-23 DIAGNOSIS — I495 Sick sinus syndrome: Secondary | ICD-10-CM | POA: Diagnosis not present

## 2018-02-23 DIAGNOSIS — N3000 Acute cystitis without hematuria: Secondary | ICD-10-CM | POA: Diagnosis not present

## 2018-02-23 DIAGNOSIS — E039 Hypothyroidism, unspecified: Secondary | ICD-10-CM | POA: Insufficient documentation

## 2018-02-23 DIAGNOSIS — K589 Irritable bowel syndrome without diarrhea: Secondary | ICD-10-CM | POA: Diagnosis present

## 2018-02-23 DIAGNOSIS — I152 Hypertension secondary to endocrine disorders: Secondary | ICD-10-CM | POA: Diagnosis present

## 2018-02-23 DIAGNOSIS — G4700 Insomnia, unspecified: Secondary | ICD-10-CM | POA: Diagnosis not present

## 2018-02-23 DIAGNOSIS — Z515 Encounter for palliative care: Secondary | ICD-10-CM | POA: Diagnosis not present

## 2018-02-23 DIAGNOSIS — D519 Vitamin B12 deficiency anemia, unspecified: Secondary | ICD-10-CM

## 2018-02-23 DIAGNOSIS — Z45010 Encounter for checking and testing of cardiac pacemaker pulse generator [battery]: Secondary | ICD-10-CM

## 2018-02-23 DIAGNOSIS — Z7982 Long term (current) use of aspirin: Secondary | ICD-10-CM | POA: Insufficient documentation

## 2018-02-23 DIAGNOSIS — Z96653 Presence of artificial knee joint, bilateral: Secondary | ICD-10-CM

## 2018-02-23 DIAGNOSIS — R296 Repeated falls: Secondary | ICD-10-CM | POA: Diagnosis not present

## 2018-02-23 DIAGNOSIS — Z7189 Other specified counseling: Secondary | ICD-10-CM | POA: Diagnosis not present

## 2018-02-23 DIAGNOSIS — N390 Urinary tract infection, site not specified: Secondary | ICD-10-CM | POA: Diagnosis not present

## 2018-02-23 DIAGNOSIS — I509 Heart failure, unspecified: Secondary | ICD-10-CM | POA: Diagnosis not present

## 2018-02-23 DIAGNOSIS — S199XXA Unspecified injury of neck, initial encounter: Secondary | ICD-10-CM | POA: Diagnosis not present

## 2018-02-23 DIAGNOSIS — R7989 Other specified abnormal findings of blood chemistry: Secondary | ICD-10-CM

## 2018-02-23 DIAGNOSIS — S0993XA Unspecified injury of face, initial encounter: Secondary | ICD-10-CM | POA: Diagnosis not present

## 2018-02-23 DIAGNOSIS — Z7984 Long term (current) use of oral hypoglycemic drugs: Secondary | ICD-10-CM

## 2018-02-23 DIAGNOSIS — K449 Diaphragmatic hernia without obstruction or gangrene: Secondary | ICD-10-CM | POA: Diagnosis present

## 2018-02-23 DIAGNOSIS — S79911A Unspecified injury of right hip, initial encounter: Secondary | ICD-10-CM | POA: Diagnosis not present

## 2018-02-23 DIAGNOSIS — E1159 Type 2 diabetes mellitus with other circulatory complications: Secondary | ICD-10-CM | POA: Diagnosis present

## 2018-02-23 DIAGNOSIS — H9190 Unspecified hearing loss, unspecified ear: Secondary | ICD-10-CM | POA: Diagnosis present

## 2018-02-23 DIAGNOSIS — R778 Other specified abnormalities of plasma proteins: Secondary | ICD-10-CM | POA: Diagnosis present

## 2018-02-23 DIAGNOSIS — I10 Essential (primary) hypertension: Secondary | ICD-10-CM | POA: Diagnosis not present

## 2018-02-23 DIAGNOSIS — Y92009 Unspecified place in unspecified non-institutional (private) residence as the place of occurrence of the external cause: Secondary | ICD-10-CM | POA: Diagnosis not present

## 2018-02-23 DIAGNOSIS — E1143 Type 2 diabetes mellitus with diabetic autonomic (poly)neuropathy: Secondary | ICD-10-CM | POA: Diagnosis not present

## 2018-02-23 DIAGNOSIS — Z950 Presence of cardiac pacemaker: Secondary | ICD-10-CM | POA: Diagnosis not present

## 2018-02-23 DIAGNOSIS — R5383 Other fatigue: Secondary | ICD-10-CM | POA: Diagnosis not present

## 2018-02-23 DIAGNOSIS — I5032 Chronic diastolic (congestive) heart failure: Secondary | ICD-10-CM | POA: Diagnosis present

## 2018-02-23 DIAGNOSIS — S3992XA Unspecified injury of lower back, initial encounter: Secondary | ICD-10-CM | POA: Diagnosis not present

## 2018-02-23 DIAGNOSIS — Z23 Encounter for immunization: Secondary | ICD-10-CM | POA: Diagnosis not present

## 2018-02-23 DIAGNOSIS — Z825 Family history of asthma and other chronic lower respiratory diseases: Secondary | ICD-10-CM | POA: Diagnosis not present

## 2018-02-23 DIAGNOSIS — J449 Chronic obstructive pulmonary disease, unspecified: Secondary | ICD-10-CM

## 2018-02-23 DIAGNOSIS — R1084 Generalized abdominal pain: Secondary | ICD-10-CM | POA: Insufficient documentation

## 2018-02-23 DIAGNOSIS — E785 Hyperlipidemia, unspecified: Secondary | ICD-10-CM | POA: Diagnosis present

## 2018-02-23 DIAGNOSIS — Z9189 Other specified personal risk factors, not elsewhere classified: Secondary | ICD-10-CM | POA: Diagnosis not present

## 2018-02-23 DIAGNOSIS — K219 Gastro-esophageal reflux disease without esophagitis: Secondary | ICD-10-CM | POA: Diagnosis present

## 2018-02-23 DIAGNOSIS — I2510 Atherosclerotic heart disease of native coronary artery without angina pectoris: Secondary | ICD-10-CM | POA: Diagnosis not present

## 2018-02-23 DIAGNOSIS — M545 Low back pain: Secondary | ICD-10-CM | POA: Diagnosis not present

## 2018-02-23 DIAGNOSIS — I5042 Chronic combined systolic (congestive) and diastolic (congestive) heart failure: Secondary | ICD-10-CM | POA: Diagnosis not present

## 2018-02-23 DIAGNOSIS — E1142 Type 2 diabetes mellitus with diabetic polyneuropathy: Secondary | ICD-10-CM | POA: Diagnosis present

## 2018-02-23 DIAGNOSIS — E871 Hypo-osmolality and hyponatremia: Secondary | ICD-10-CM | POA: Diagnosis not present

## 2018-02-23 DIAGNOSIS — R69 Illness, unspecified: Secondary | ICD-10-CM | POA: Diagnosis not present

## 2018-02-23 DIAGNOSIS — R748 Abnormal levels of other serum enzymes: Secondary | ICD-10-CM | POA: Diagnosis not present

## 2018-02-23 DIAGNOSIS — J90 Pleural effusion, not elsewhere classified: Secondary | ICD-10-CM | POA: Diagnosis not present

## 2018-02-23 DIAGNOSIS — M25551 Pain in right hip: Secondary | ICD-10-CM | POA: Diagnosis not present

## 2018-02-23 DIAGNOSIS — W19XXXA Unspecified fall, initial encounter: Secondary | ICD-10-CM

## 2018-02-23 DIAGNOSIS — S0990XA Unspecified injury of head, initial encounter: Secondary | ICD-10-CM | POA: Diagnosis not present

## 2018-02-23 DIAGNOSIS — I248 Other forms of acute ischemic heart disease: Secondary | ICD-10-CM | POA: Diagnosis not present

## 2018-02-23 DIAGNOSIS — E1169 Type 2 diabetes mellitus with other specified complication: Secondary | ICD-10-CM | POA: Diagnosis present

## 2018-02-23 DIAGNOSIS — K5790 Diverticulosis of intestine, part unspecified, without perforation or abscess without bleeding: Secondary | ICD-10-CM | POA: Diagnosis present

## 2018-02-23 LAB — URINALYSIS, ROUTINE W REFLEX MICROSCOPIC
Bilirubin Urine: NEGATIVE
GLUCOSE, UA: NEGATIVE mg/dL
Hgb urine dipstick: NEGATIVE
KETONES UR: NEGATIVE mg/dL
Nitrite: NEGATIVE
PH: 6 (ref 5.0–8.0)
Protein, ur: 100 mg/dL — AB
SPECIFIC GRAVITY, URINE: 1.012 (ref 1.005–1.030)

## 2018-02-23 LAB — CBC WITH DIFFERENTIAL/PLATELET
Basophils Absolute: 0 10*3/uL (ref 0.0–0.1)
Basophils Relative: 0 %
EOS ABS: 0 10*3/uL (ref 0.0–0.7)
Eosinophils Relative: 0 %
HEMATOCRIT: 30.5 % — AB (ref 36.0–46.0)
Hemoglobin: 10.3 g/dL — ABNORMAL LOW (ref 12.0–15.0)
LYMPHS ABS: 0.9 10*3/uL (ref 0.7–4.0)
LYMPHS PCT: 11 %
MCH: 28.7 pg (ref 26.0–34.0)
MCHC: 33.8 g/dL (ref 30.0–36.0)
MCV: 85 fL (ref 78.0–100.0)
MONOS PCT: 7 %
Monocytes Absolute: 0.6 10*3/uL (ref 0.1–1.0)
Neutro Abs: 6.6 10*3/uL (ref 1.7–7.7)
Neutrophils Relative %: 82 %
Platelets: 204 10*3/uL (ref 150–400)
RBC: 3.59 MIL/uL — ABNORMAL LOW (ref 3.87–5.11)
RDW: 14.5 % (ref 11.5–15.5)
WBC: 8.1 10*3/uL (ref 4.0–10.5)

## 2018-02-23 LAB — TROPONIN I
TROPONIN I: 0.04 ng/mL — AB (ref <0.03)
Troponin I: 0.05 ng/mL (ref <0.03)

## 2018-02-23 LAB — BASIC METABOLIC PANEL
ANION GAP: 9 (ref 5–15)
BUN: 11 mg/dL (ref 8–23)
CHLORIDE: 88 mmol/L — AB (ref 98–111)
CO2: 28 mmol/L (ref 22–32)
Calcium: 8.7 mg/dL — ABNORMAL LOW (ref 8.9–10.3)
Creatinine, Ser: 0.77 mg/dL (ref 0.44–1.00)
GFR calc Af Amer: >60 mL/min (ref >60)
GFR calc non Af Amer: >60 mL/min (ref >60)
Glucose, Bld: 166 mg/dL — ABNORMAL HIGH (ref 70–99)
POTASSIUM: 3.6 mmol/L (ref 3.5–5.1)
SODIUM: 125 mmol/L — AB (ref 135–145)

## 2018-02-23 LAB — HEPATIC FUNCTION PANEL
ALK PHOS: 56 U/L (ref 38–126)
ALT: 36 U/L (ref 0–44)
AST: 41 U/L (ref 15–41)
Albumin: 3.4 g/dL — ABNORMAL LOW (ref 3.5–5.0)
BILIRUBIN DIRECT: 0.3 mg/dL — AB (ref 0.0–0.2)
BILIRUBIN TOTAL: 1.2 mg/dL (ref 0.3–1.2)
Indirect Bilirubin: 0.9 mg/dL (ref 0.3–0.9)
Total Protein: 5.9 g/dL — ABNORMAL LOW (ref 6.5–8.1)

## 2018-02-23 LAB — GLUCOSE, CAPILLARY: Glucose-Capillary: 158 mg/dL — ABNORMAL HIGH (ref 70–99)

## 2018-02-23 LAB — BRAIN NATRIURETIC PEPTIDE: B Natriuretic Peptide: 4168 pg/mL — ABNORMAL HIGH (ref 0.0–100.0)

## 2018-02-23 LAB — CK: CK TOTAL: 61 U/L (ref 38–234)

## 2018-02-23 MED ORDER — ONDANSETRON HCL 4 MG/2ML IJ SOLN: 4.0000 mg | Freq: Four times a day (QID) | INTRAMUSCULAR | Status: DC | PRN

## 2018-02-23 MED ORDER — NYSTATIN-TRIAMCINOLONE 100000-0.1 UNIT/GM-% EX CREA
TOPICAL_CREAM | Freq: Two times a day (BID) | CUTANEOUS | Status: DC
  Administered 2018-02-23 – 2018-02-25 (×4): via TOPICAL
  Filled 2018-02-23 (×2): qty 15

## 2018-02-23 MED ORDER — ACETAMINOPHEN 650 MG RE SUPP: 650.0000 mg | Freq: Four times a day (QID) | RECTAL | Status: DC | PRN

## 2018-02-23 MED ORDER — SODIUM CHLORIDE 0.9% FLUSH
3.0000 mL | INTRAVENOUS | Status: DC | PRN
  Administered 2018-02-25: 3 mL via INTRAVENOUS
  Filled 2018-02-23: qty 3

## 2018-02-23 MED ORDER — ASPIRIN EC 81 MG PO TBEC
81.0000 mg | DELAYED_RELEASE_TABLET | Freq: Every day | ORAL | Status: DC
  Administered 2018-02-24 – 2018-02-25 (×2): 81 mg via ORAL
  Filled 2018-02-23 (×2): qty 1

## 2018-02-23 MED ORDER — SERTRALINE HCL 50 MG PO TABS
100.0000 mg | ORAL_TABLET | Freq: Every day | ORAL | Status: DC
  Administered 2018-02-24 – 2018-02-25 (×2): 100 mg via ORAL
  Filled 2018-02-23 (×2): qty 2

## 2018-02-23 MED ORDER — SODIUM CHLORIDE 0.9 % IV SOLN
INTRAVENOUS | Status: DC
  Administered 2018-02-23 – 2018-02-24 (×3): via INTRAVENOUS

## 2018-02-23 MED ORDER — SODIUM CHLORIDE 0.9 % IV SOLN: 250.0000 mL | INTRAVENOUS | Status: DC | PRN

## 2018-02-23 MED ORDER — SERTRALINE HCL 50 MG PO TABS
50.0000 mg | ORAL_TABLET | Freq: Every day | ORAL | Status: DC
  Filled 2018-02-23: qty 1

## 2018-02-23 MED ORDER — ALBUTEROL SULFATE HFA 108 (90 BASE) MCG/ACT IN AERS: 2.0000 | INHALATION_SPRAY | Freq: Four times a day (QID) | RESPIRATORY_TRACT | Status: DC | PRN

## 2018-02-23 MED ORDER — METOPROLOL SUCCINATE ER 25 MG PO TB24
25.0000 mg | ORAL_TABLET | Freq: Every day | ORAL | Status: DC
  Administered 2018-02-24 – 2018-02-25 (×2): 25 mg via ORAL
  Filled 2018-02-23 (×2): qty 1

## 2018-02-23 MED ORDER — SODIUM CHLORIDE 0.9% FLUSH: 3.0000 mL | Freq: Two times a day (BID) | INTRAVENOUS | Status: DC

## 2018-02-23 MED ORDER — INSULIN ASPART 100 UNIT/ML ~~LOC~~ SOLN
0.0000 [IU] | Freq: Three times a day (TID) | SUBCUTANEOUS | Status: DC
  Administered 2018-02-24: 1 [IU] via SUBCUTANEOUS
  Administered 2018-02-24: 2 [IU] via SUBCUTANEOUS
  Administered 2018-02-24 – 2018-02-25 (×3): 1 [IU] via SUBCUTANEOUS

## 2018-02-23 MED ORDER — ONDANSETRON HCL 4 MG PO TABS: 4.0000 mg | ORAL_TABLET | Freq: Four times a day (QID) | ORAL | Status: DC | PRN

## 2018-02-23 MED ORDER — LISINOPRIL 10 MG PO TABS
10.0000 mg | ORAL_TABLET | Freq: Every day | ORAL | Status: DC
  Administered 2018-02-24 – 2018-02-25 (×2): 10 mg via ORAL
  Filled 2018-02-23 (×2): qty 1

## 2018-02-23 MED ORDER — HYDRALAZINE HCL 20 MG/ML IJ SOLN: 10.0000 mg | Freq: Four times a day (QID) | INTRAMUSCULAR | Status: DC | PRN

## 2018-02-23 MED ORDER — LEVOTHYROXINE SODIUM 25 MCG PO TABS
25.0000 ug | ORAL_TABLET | Freq: Every day | ORAL | Status: DC
  Administered 2018-02-24 – 2018-02-25 (×2): 25 ug via ORAL
  Filled 2018-02-23 (×2): qty 1

## 2018-02-23 MED ORDER — SODIUM CHLORIDE 0.9 % IV BOLUS
1000.0000 mL | Freq: Once | INTRAVENOUS | Status: AC
  Administered 2018-02-23: 1000 mL via INTRAVENOUS

## 2018-02-23 MED ORDER — METFORMIN HCL 500 MG PO TABS
1000.0000 mg | ORAL_TABLET | Freq: Two times a day (BID) | ORAL | Status: DC
  Administered 2018-02-23 – 2018-02-25 (×4): 1000 mg via ORAL
  Filled 2018-02-23 (×4): qty 2

## 2018-02-23 MED ORDER — NITROGLYCERIN 0.4 MG/SPRAY TL SOLN
1.0000 | Status: DC | PRN
  Filled 2018-02-23: qty 4.9

## 2018-02-23 MED ORDER — POLYETHYLENE GLYCOL 3350 17 G PO PACK: 17.0000 g | PACK | Freq: Every day | ORAL | Status: DC | PRN

## 2018-02-23 MED ORDER — INSULIN ASPART 100 UNIT/ML ~~LOC~~ SOLN: 0.0000 [IU] | Freq: Every day | SUBCUTANEOUS | Status: DC

## 2018-02-23 MED ORDER — HEPARIN SODIUM (PORCINE) 5000 UNIT/ML IJ SOLN
5000.0000 [IU] | Freq: Three times a day (TID) | INTRAMUSCULAR | Status: DC
  Administered 2018-02-23 – 2018-02-25 (×5): 5000 [IU] via SUBCUTANEOUS
  Filled 2018-02-23 (×5): qty 1

## 2018-02-23 MED ORDER — TRAZODONE HCL 50 MG PO TABS: 50.0000 mg | ORAL_TABLET | Freq: Every evening | ORAL | Status: DC | PRN

## 2018-02-23 MED ORDER — ACETAMINOPHEN 325 MG PO TABS
650.0000 mg | ORAL_TABLET | Freq: Four times a day (QID) | ORAL | Status: DC | PRN
  Administered 2018-02-23: 650 mg via ORAL
  Filled 2018-02-23: qty 2

## 2018-02-23 MED ORDER — ALBUTEROL SULFATE (2.5 MG/3ML) 0.083% IN NEBU: 2.5000 mg | INHALATION_SOLUTION | RESPIRATORY_TRACT | Status: DC | PRN

## 2018-02-23 MED ORDER — MOMETASONE FURO-FORMOTEROL FUM 100-5 MCG/ACT IN AERO
2.0000 | INHALATION_SPRAY | Freq: Two times a day (BID) | RESPIRATORY_TRACT | Status: DC
  Administered 2018-02-24 – 2018-02-25 (×3): 2 via RESPIRATORY_TRACT
  Filled 2018-02-23 (×2): qty 8.8

## 2018-02-23 MED ORDER — GABAPENTIN 300 MG PO CAPS
300.0000 mg | ORAL_CAPSULE | Freq: Three times a day (TID) | ORAL | Status: DC
  Administered 2018-02-23 – 2018-02-25 (×7): 300 mg via ORAL
  Filled 2018-02-23 (×7): qty 1

## 2018-02-23 MED ORDER — ALPRAZOLAM 0.5 MG PO TABS: 0.5000 mg | ORAL_TABLET | Freq: Every evening | ORAL | Status: DC | PRN

## 2018-02-23 MED ORDER — PRAVASTATIN SODIUM 40 MG PO TABS
80.0000 mg | ORAL_TABLET | Freq: Every day | ORAL | Status: DC
  Administered 2018-02-24 – 2018-02-25 (×2): 80 mg via ORAL
  Filled 2018-02-23: qty 2
  Filled 2018-02-23: qty 4
  Filled 2018-02-23: qty 2
  Filled 2018-02-23: qty 4

## 2018-02-23 NOTE — ED Provider Notes (Signed)
[facility] EMERGENCY DEPARTMENT Provider Note   CSN: [id number] Arrival date & time: 02/23/18  1137     History   Chief Complaint Chief Complaint  Patient presents with  . Fall    HPI DELPHA PERKO is a 84 y.o. female.  Patient fell around 7 PM last night and was found this morning around 10 AM.  She does not understand why she fell.  She complains of some hip pains and back pain  The history is provided by the patient. No language interpreter was used.  Fall  This is a new problem. The current episode started 12 to 24 hours ago. The problem occurs rarely. The problem has been resolved. Pertinent negatives include no chest pain, no abdominal pain and no headaches. Nothing aggravates the symptoms. Nothing relieves the symptoms. She has tried nothing for the symptoms. The treatment provided no relief.    Past Medical History:  Diagnosis Date  . ANXIETY   . Arteriosclerotic cardiovascular disease (ASCVD)    Cath in 9/99-70% mid LAD with diffuse distal disease, 70% T1, 60% mid circumflex, 50% mid RCA, normal ejection fraction; negative stress nuclear in 07/2008  . Carpal tunnel syndrome   . Cerebrovascular disease    carotid bruits; no focal disease in 1999, 2002 and 2006  . Closed left fibular fracture 11/30/11  . COPD   . Diabetes mellitus    Insulin treatment  . Diastolic CHF, chronic (HCC)    Normal EF  . Gastroesophageal reflux disease    With hiatal hernia; irritable bowel syndrome; H. pylori-treated; Colonoscopy 2008: non-specific colitis, IH, Diverticulosis, Rectal ulcer secondary to ASA  . HOH (hard of hearing)   . Hyperlipidemia   . Hypertension   . Hypothyroidism   . OSTEOARTHRITIS, KNEES, BILATERAL    Bilateral TKA  . Pacemaker   . Peripheral neuropathy   . Sinoatrial node dysfunction (HCC) 2003   Medtronic dual-chamber device  . Sleep apnea    pt could not tolerate  . VITAMIN B12 DEFICIENCY     Patient Active Problem List   Diagnosis Date Noted  .  Fall at home, initial encounter 02/23/2018  . Fall 02/23/2018  . Aortic atherosclerosis (HCC) 10/15/2017  . Acute on chronic combined systolic and diastolic CHF (congestive heart failure) (HCC) 07/02/2017  . Pleural effusion   . Palliative care encounter   . Goals of care, counseling/discussion   . Pressure injury of skin 06/29/2017  . Elevated troponin 06/28/2017  . Normocytic anemia 06/28/2017  . Sleep apnea 06/28/2017  . Tinea cruris 06/28/2017  . Hyperlipidemia associated with type 2 diabetes mellitus (HCC) 05/14/2017  . Chronic back pain 01/28/2017  . PVD (peripheral vascular disease) (HCC) 01/27/2016  . Skin moniliasis 05/31/2014  . OAB (overactive bladder) 12/23/2012  . Insomnia 04/06/2011  . Sinoatrial node dysfunction (HCC)   . Gastroesophageal reflux disease   . Type II diabetes mellitus with peripheral autonomic neuropathy (HCC)   . GAD (generalized anxiety disorder) 12/30/2009  . Diabetic neuropathy (HCC) 12/26/2009  . Vitamin B 12 deficiency 10/11/2009  . Morbid obesity (HCC) 09/16/2009  . Arteriosclerotic cardiovascular disease (ASCVD) 09/15/2009  . CEREBROVASCULAR DISEASE 09/15/2009  . Hypertension associated with diabetes (HCC) 08/29/2009  . Osteoarthrosis involving lower leg 08/29/2009  . COLONIC POLYPS, ADENOMATOUS, HX OF 02/22/2009  . Hypothyroid 02/17/2009  . Chronic obstructive pulmonary disease (HCC) 02/17/2009  . Chronic diastolic heart failure (HCC) 12/15/2008  . PPM-Medtronic 12/15/2008    Past Surgical History:  Procedure Laterality Date  .  ABDOMINAL HYSTERECTOMY    . BLADDER REPAIR    . CARDIAC PACEMAKER PLACEMENT  2003   Medtronic, dual-chamber  . CARPAL TUNNEL RELEASE    . CATARACT EXTRACTION W/PHACO Left 08/09/2017   Procedure: CATARACT EXTRACTION PHACO AND INTRAOCULAR LENS PLACEMENT (IOC);  Surgeon: Fabio Pierce, MD;  Location: [facility];  Service: Ophthalmology;  Laterality: Left;  CDE: 6.26  . CHOLECYSTECTOMY    . COLONOSCOPY  2008  .  HEMORRHOID SURGERY    . NASAL FRACTURE SURGERY    . THYROIDECTOMY  1980   Goiter  . TOTAL KNEE ARTHROPLASTY     Bilateral     OB History   None      Home Medications    Prior to Admission medications   Medication Sig Start Date End Date Taking? Authorizing Provider  levothyroxine (SYNTHROID, LEVOTHROID) 25 MCG tablet TAKE 1 TABLET DAILY BEFORE BREAKFAST 07/26/17  Yes Hawks, Christy A, FNP  lisinopril (PRINIVIL,ZESTRIL) 10 MG tablet TAKE 1 TABLET DAILY 02/21/18  Yes Jannifer Rodney A, FNP  metoprolol succinate (TOPROL-XL) 25 MG 24 hr tablet TAKE (1) TABLET DAILY WITH FOOD. 12/13/17  Yes Hawks, Christy A, FNP  pravastatin (PRAVACHOL) 80 MG tablet TAKE 1 TABLET ONCE DAILY FOR CHOLESTEROL 02/21/18  Yes Hawks, Christy A, FNP  sertraline (ZOLOFT) 100 MG tablet Take 1 tablet (100 mg total) by mouth daily. 11/13/17  Yes Hawks, Neysa Bonito A, FNP  sertraline (ZOLOFT) 50 MG tablet TAKE 1 TABLET DAILY 02/21/18  Yes Hawks, Christy A, FNP  ALPRAZolam (XANAX) 0.5 MG tablet Take 1 tablet (0.5 mg total) by mouth at bedtime as needed for anxiety. 11/13/17   Jannifer Rodney A, FNP  aspirin EC 81 MG tablet Take 81 mg by mouth daily. Reported on 12/14/2015    [provider]  budesonide-formoterol (SYMBICORT) 80-4.5 MCG/ACT inhaler 2 PUFFS 2 TIMES A DAY 08/07/17   Hawks, Neysa Bonito A, FNP  doxycycline (VIBRA-TABS) 100 MG tablet Take 1 tablet (100 mg total) by mouth 2 (two) times daily. 01/08/18   Junie Spencer, FNP  furosemide (LASIX) 40 MG tablet Take 1 tablet (40 mg total) by mouth 2 (two) times daily. 07/30/17   Strader, Lennart Pall, PA-C  gabapentin (NEURONTIN) 300 MG capsule TAKE (1) CAPSULE THREE TIMES DAILY. 01/20/18   Jannifer Rodney A, FNP  glucose blood (GLUCOSE METER TEST) test strip Use BID 07/11/17   Hawks, Neysa Bonito A, FNP  levothyroxine (SYNTHROID, LEVOTHROID) 200 MCG tablet TAKE (1) TABLET DAILY BE- FORE BREAKFAST. 03/29/17   Junie Spencer, FNP  lisinopril (PRINIVIL,ZESTRIL) 10 MG tablet TAKE 1 TABLET  DAILY 11/26/16   Jannifer Rodney A, FNP  metFORMIN (GLUCOPHAGE) 1000 MG tablet TAKE (1) TABLET TWICE A DAY WITH MEALS (BREAKFAST AND SUPPER) FOR DIAB ETES 01/24/18   Hawks, Neysa Bonito A, FNP  nitroGLYCERIN (NITROLINGUAL) 0.4 MG/SPRAY spray Place 1 spray under the tongue every 5 (five) minutes as needed. 07/09/17   Strader, Lennart Pall, PA-C  nystatin-triamcinolone (MYCOLOG II) cream APPLY TO AFFECTED AREA 3 TIMES A DAY 12/28/17   Junie Spencer, FNP  PROAIR HFA 108 [phone] Base) MCG/ACT inhaler 2 PUFFS EVERY 6 HOURS AS NEEDED FOR WHEEZING 08/07/17   Junie Spencer, FNP    Family History Family History  Problem Relation Age of Onset  . COPD Father   . Heart attack Father 63  . Heart failure Mother   . Cancer Sister        colon  . Cancer Brother        lung  .  Cancer Sister        bladder  . Cancer Brother        lung  . Early death Son   . Diabetes Son   . Heart disease Son   . Cancer Son        prostate  . Cancer Sister        kidney cancer  . Diabetes Sister   . Cancer Daughter     Social History Social History   Tobacco Use  . Smoking status: Never Smoker  . Smokeless tobacco: Never Used  Substance Use Topics  . Alcohol use: No    Alcohol/week: 0.0 standard drinks  . Drug use: No     Allergies   Penicillins   Review of Systems Review of Systems  Constitutional: Positive for fatigue. Negative for appetite change.  HENT: Negative for congestion, ear discharge and sinus pressure.   Eyes: Negative for discharge.  Respiratory: Negative for cough.   Cardiovascular: Negative for chest pain.  Gastrointestinal: Negative for abdominal pain and diarrhea.  Genitourinary: Negative for frequency and hematuria.  Musculoskeletal: Negative for back pain.       Pain and lower back  Skin: Negative for rash.  Neurological: Negative for seizures and headaches.  Psychiatric/Behavioral: Negative for hallucinations.     Physical Exam Updated Vital Signs BP (!) 165/62 (BP  Location: Right Arm)   Pulse 71   Temp 97.8 F (36.6 C) (Oral)   Resp 16   Ht 5\' 10"  (1.778 m)   Wt 83.9 kg   SpO2 95%   BMI 26.54 kg/m   Physical Exam  Constitutional: She is oriented to person, place, and time. She appears well-developed.  HENT:  Head: Normocephalic.  Mucous membranes dry,   Eyes: Conjunctivae and EOM are normal. No scleral icterus.  Neck: Neck supple. No thyromegaly present.  Cardiovascular: Normal rate and regular rhythm. Exam reveals no gallop and no friction rub.  No murmur heard. Pulmonary/Chest: No stridor. She has no wheezes. She has no rales. She exhibits no tenderness.  Abdominal: She exhibits no distension. There is no tenderness. There is no rebound.  Musculoskeletal: Normal range of motion. She exhibits no edema.  Mild tenderness to lumbar spine  Lymphadenopathy:    She has no cervical adenopathy.  Neurological: She is oriented to person, place, and time. She exhibits normal muscle tone. Coordination normal.  Skin: No rash noted. No erythema.  Psychiatric: She has a normal mood and affect. Her behavior is normal.     ED Treatments / Results  Labs (all labs ordered are listed, but only abnormal results are displayed) Labs Reviewed  CBC WITH DIFFERENTIAL/PLATELET - Abnormal; Notable for the following components:      Result Value   RBC 3.59 (*)    Hemoglobin 10.3 (*)    HCT 30.5 (*)    All other components within normal limits  BASIC METABOLIC PANEL - Abnormal; Notable for the following components:   Sodium 125 (*)    Chloride 88 (*)    Glucose, Bld 166 (*)    Calcium 8.7 (*)    All other components within normal limits  HEPATIC FUNCTION PANEL - Abnormal; Notable for the following components:   Total Protein 5.9 (*)    Albumin 3.4 (*)    Bilirubin, Direct 0.3 (*)    All other components within normal limits  TROPONIN I - Abnormal; Notable for the following components:   Troponin I 0.04 (*)    All other components  within normal limits   BRAIN NATRIURETIC PEPTIDE - Abnormal; Notable for the following components:   B Natriuretic Peptide 4,168.0 (*)    All other components within normal limits  CK  URINALYSIS, ROUTINE W REFLEX MICROSCOPIC  TROPONIN I  TROPONIN I    EKG None  Radiology Dg Chest 1 View  Result Date: 02/23/2018 CLINICAL DATA:  Fall EXAM: CHEST  1 VIEW COMPARISON:  10/14/2017 FINDINGS: Small right pleural effusion. Associated right lower lobe opacity, likely atelectasis. Cardiomegaly. No frank interstitial edema. Left subclavian pacemaker. IMPRESSION: Small right pleural effusion. Associated right lower lobe opacity, likely atelectasis. Electronically Signed   By: Charline BillsSriyesh  Krishnan M.D.   On: 02/23/2018 13:52   Dg Lumbar Spine Complete  Result Date: 02/23/2018 CLINICAL DATA:  Fall EXAM: LUMBAR SPINE - COMPLETE 4+ VIEW COMPARISON:  None. FINDINGS: Five lumbar-type vertebral bodies. Mild straightening of the lumbar spine. No evidence of fracture or dislocation. Vertebral body heights are maintained. Mild to moderate multilevel degenerative changes. Visualized bony pelvis appears intact. IMPRESSION: No fracture or dislocation is seen. Mild to moderate degenerative changes. Electronically Signed   By: Charline BillsSriyesh  Krishnan M.D.   On: 02/23/2018 13:50   Ct Head Wo Contrast  Result Date: 02/23/2018 CLINICAL DATA:  Larey SeatFell.  Hit head. EXAM: CT HEAD WITHOUT CONTRAST CT MAXILLOFACIAL WITHOUT CONTRAST CT CERVICAL SPINE WITHOUT CONTRAST TECHNIQUE: Multidetector CT imaging of the head, cervical spine, and maxillofacial structures were performed using the standard protocol without intravenous contrast. Multiplanar CT image reconstructions of the cervical spine and maxillofacial structures were also generated. COMPARISON:  07/02/2017 FINDINGS: CT HEAD FINDINGS Brain: Stable age related cerebral atrophy, ventriculomegaly and periventricular white matter disease. No extra-axial fluid collections are identified. No CT findings for acute  hemispheric infarction or intracranial hemorrhage. No mass lesions. The brainstem and cerebellum are normal. Vascular: No hyperdense vessel or unexpected calcification. Skull: No acute skull fracture. Other: No scalp hematoma or obvious laceration. CT MAXILLOFACIAL FINDINGS Osseous: No acute facial bone fractures. The mandibular condyles are normally located. No mandible fracture. Orbits: No orbital fracture.  The globes are intact. Sinuses: The paranasal sinuses and mastoid air cells are clear. Concha bullosa of the left middle turbinates noted and there is deviation of the bony nasal septum rightward. Soft tissues: No significant soft tissue swelling or hematoma. CT CERVICAL SPINE FINDINGS Alignment: Exam degraded by motion artifact. Grossly normal alignment. Skull base and vertebrae: No obvious fracture. Soft tissues and spinal canal: No abnormal prevertebral soft tissue swelling or canal hematoma. Disc levels: The spinal canal is generous. No spinal stenosis. Mild multilevel foraminal narrowing due to uncinate spurring. Upper chest: The lung apices are grossly clear. Other: Permanent left-sided pacemaker noted. Advanced carotid artery calcifications. IMPRESSION: 1. No acute intracranial findings or skull fracture. 2. No acute facial bone fractures. 3. Cervical spine images are degraded by motion artifact but grossly normal alignment and no obvious acute fracture. Electronically Signed   By: Rudie MeyerP.  Gallerani M.D.   On: 02/23/2018 13:44   Ct Cervical Spine Wo Contrast  Result Date: 02/23/2018 CLINICAL DATA:  Larey SeatFell.  Hit head. EXAM: CT HEAD WITHOUT CONTRAST CT MAXILLOFACIAL WITHOUT CONTRAST CT CERVICAL SPINE WITHOUT CONTRAST TECHNIQUE: Multidetector CT imaging of the head, cervical spine, and maxillofacial structures were performed using the standard protocol without intravenous contrast. Multiplanar CT image reconstructions of the cervical spine and maxillofacial structures were also generated. COMPARISON:   07/02/2017 FINDINGS: CT HEAD FINDINGS Brain: Stable age related cerebral atrophy, ventriculomegaly and periventricular white matter disease. No extra-axial  fluid collections are identified. No CT findings for acute hemispheric infarction or intracranial hemorrhage. No mass lesions. The brainstem and cerebellum are normal. Vascular: No hyperdense vessel or unexpected calcification. Skull: No acute skull fracture. Other: No scalp hematoma or obvious laceration. CT MAXILLOFACIAL FINDINGS Osseous: No acute facial bone fractures. The mandibular condyles are normally located. No mandible fracture. Orbits: No orbital fracture.  The globes are intact. Sinuses: The paranasal sinuses and mastoid air cells are clear. Concha bullosa of the left middle turbinates noted and there is deviation of the bony nasal septum rightward. Soft tissues: No significant soft tissue swelling or hematoma. CT CERVICAL SPINE FINDINGS Alignment: Exam degraded by motion artifact. Grossly normal alignment. Skull base and vertebrae: No obvious fracture. Soft tissues and spinal canal: No abnormal prevertebral soft tissue swelling or canal hematoma. Disc levels: The spinal canal is generous. No spinal stenosis. Mild multilevel foraminal narrowing due to uncinate spurring. Upper chest: The lung apices are grossly clear. Other: Permanent left-sided pacemaker noted. Advanced carotid artery calcifications. IMPRESSION: 1. No acute intracranial findings or skull fracture. 2. No acute facial bone fractures. 3. Cervical spine images are degraded by motion artifact but grossly normal alignment and no obvious acute fracture. Electronically Signed   By: Rudie Meyer M.D.   On: 02/23/2018 13:44   Dg Hip Unilat W Or Wo Pelvis 2-3 Views Right  Result Date: 02/23/2018 CLINICAL DATA:  Fall EXAM: DG HIP (WITH OR WITHOUT PELVIS) 2-3V RIGHT COMPARISON:  None. FINDINGS: No fracture or dislocation is seen. Bilateral hip joint spaces are preserved. Visualized bony  pelvis appears intact. Degenerative changes of the lower lumbar spine. IMPRESSION: Negative. Electronically Signed   By: Charline Bills M.D.   On: 02/23/2018 13:51   Ct Maxillofacial Wo Contrast  Result Date: 02/23/2018 CLINICAL DATA:  Larey Seat.  Hit head. EXAM: CT HEAD WITHOUT CONTRAST CT MAXILLOFACIAL WITHOUT CONTRAST CT CERVICAL SPINE WITHOUT CONTRAST TECHNIQUE: Multidetector CT imaging of the head, cervical spine, and maxillofacial structures were performed using the standard protocol without intravenous contrast. Multiplanar CT image reconstructions of the cervical spine and maxillofacial structures were also generated. COMPARISON:  07/02/2017 FINDINGS: CT HEAD FINDINGS Brain: Stable age related cerebral atrophy, ventriculomegaly and periventricular white matter disease. No extra-axial fluid collections are identified. No CT findings for acute hemispheric infarction or intracranial hemorrhage. No mass lesions. The brainstem and cerebellum are normal. Vascular: No hyperdense vessel or unexpected calcification. Skull: No acute skull fracture. Other: No scalp hematoma or obvious laceration. CT MAXILLOFACIAL FINDINGS Osseous: No acute facial bone fractures. The mandibular condyles are normally located. No mandible fracture. Orbits: No orbital fracture.  The globes are intact. Sinuses: The paranasal sinuses and mastoid air cells are clear. Concha bullosa of the left middle turbinates noted and there is deviation of the bony nasal septum rightward. Soft tissues: No significant soft tissue swelling or hematoma. CT CERVICAL SPINE FINDINGS Alignment: Exam degraded by motion artifact. Grossly normal alignment. Skull base and vertebrae: No obvious fracture. Soft tissues and spinal canal: No abnormal prevertebral soft tissue swelling or canal hematoma. Disc levels: The spinal canal is generous. No spinal stenosis. Mild multilevel foraminal narrowing due to uncinate spurring. Upper chest: The lung apices are grossly  clear. Other: Permanent left-sided pacemaker noted. Advanced carotid artery calcifications. IMPRESSION: 1. No acute intracranial findings or skull fracture. 2. No acute facial bone fractures. 3. Cervical spine images are degraded by motion artifact but grossly normal alignment and no obvious acute fracture. Electronically Signed   By: Rudie Meyer  M.D.   On: 02/23/2018 13:44    Procedures Procedures (including critical care time)  Medications Ordered in ED Medications  aspirin EC tablet 81 mg (has no administration in time range)  lisinopril (PRINIVIL,ZESTRIL) tablet 10 mg (has no administration in time range)  nitroGLYCERIN (NITROLINGUAL) 0.4 MG/SPRAY spray 1 spray (has no administration in time range)  levothyroxine (SYNTHROID, LEVOTHROID) tablet 25 mcg (has no administration in time range)  mometasone-formoterol (DULERA) 100-5 MCG/ACT inhaler 2 puff (has no administration in time range)  albuterol (PROVENTIL HFA;VENTOLIN HFA) 108 (90 Base) MCG/ACT inhaler 2 puff (has no administration in time range)  sertraline (ZOLOFT) tablet 100 mg (has no administration in time range)  ALPRAZolam (XANAX) tablet 0.5 mg (has no administration in time range)  metoprolol succinate (TOPROL-XL) 24 hr tablet 25 mg (has no administration in time range)  nystatin-triamcinolone (MYCOLOG II) cream (has no administration in time range)  gabapentin (NEURONTIN) capsule 300 mg (has no administration in time range)  metFORMIN (GLUCOPHAGE) tablet 1,000 mg (has no administration in time range)  pravastatin (PRAVACHOL) tablet 20 mg (has no administration in time range)  sertraline (ZOLOFT) tablet 50 mg (has no administration in time range)  sodium chloride flush (NS) 0.9 % injection 3 mL (has no administration in time range)  sodium chloride flush (NS) 0.9 % injection 3 mL (has no administration in time range)  0.9 %  sodium chloride infusion (has no administration in time range)  acetaminophen (TYLENOL) tablet 650 mg  (has no administration in time range)    Or  acetaminophen (TYLENOL) suppository 650 mg (has no administration in time range)  traZODone (DESYREL) tablet 50 mg (has no administration in time range)  polyethylene glycol (MIRALAX / GLYCOLAX) packet 17 g (has no administration in time range)  ondansetron (ZOFRAN) tablet 4 mg (has no administration in time range)    Or  ondansetron (ZOFRAN) injection 4 mg (has no administration in time range)  albuterol (PROVENTIL) (2.5 MG/3ML) 0.083% nebulizer solution 2.5 mg (has no administration in time range)  heparin injection 5,000 Units (has no administration in time range)  insulin aspart (novoLOG) injection 0-9 Units (has no administration in time range)  insulin aspart (novoLOG) injection 0-5 Units (has no administration in time range)  0.9 %  sodium chloride infusion (has no administration in time range)  hydrALAZINE (APRESOLINE) injection 10 mg (has no administration in time range)  sodium chloride 0.9 % bolus 1,000 mL (1,000 mLs Intravenous New Bag/Given 02/23/18 1258)     Initial Impression / Assessment and Plan / ED Course  I have reviewed the triage vital signs and the nursing notes.  Pertinent labs & imaging results that were available during my care of the patient were reviewed by me and considered in my medical decision making (see chart for details).     Labs show patient to be hyponatremic and congestive heart failure.  Patient will be admitted to medicine Final Clinical Impressions(s) / ED Diagnoses   Final diagnoses:  Fall, initial encounter    ED Discharge Orders    None       Bethann Berkshire, MD 02/23/18 1536

## 2018-02-23 NOTE — ED Notes (Signed)
CRITICAL VALUE ALERT  Critical Value:  Troponin 0.04  Date & Time Notied:  02/23/2018, 1320  Provider Notified: Dr. Estell HarpinZammit  Orders Received/Actions taken:   See chart

## 2018-02-23 NOTE — ED Notes (Signed)
Medtr[employer] rep called and reports the only abnormality seen on pace maker interrogation was a ventricular high rate in oct of 2018.  Notified hospitalist.

## 2018-02-23 NOTE — H&P (Signed)
Patient Demographics:    [name] [name], is a 84 y.o. female  MRN: [id number]   DOB - 1933-01-31  Admit Date - 02/23/2018  Outpatient Primary MD for the patient is Junie Spencer, FNP   Assessment & Plan:    Principal Problem:   Fall at home, initial encounter Active Problems:   Hypothyroid   Vitamin B 12 deficiency   GAD (generalized anxiety disorder)   Hypertension associated with diabetes (HCC)   Chronic diastolic heart failure (HCC)   PPM-Medtronic   Elevated troponin   Sleep apnea   Fall  Place patient in observation status- At this time patient remains very weak unable to perform mobility related activities of daily living, patient will need physical therapy evaluation, she had no oral intake for over 18 hours now, patient will need gentle hydration with IV fluids   1)Fall at home/Generalized weakness/debility-get physical therapy evaluation, no acute fractures or serious injuries identified  2)H/o arrhythmia/sick sinus syndrome--status post Medtronic dual-chamber pacemaker placement, pacemaker interrogated by Medtronic tech at this time no significant abnormalities noted recently  3)DM2-excellent control, recent A1c 6.2, continue metformin 1 g twice daily with meals, Use Novolog/Humalog Sliding scale insulin with Accu-Cheks/Fingersticks as ordered   4)HTN-okay to restart metoprolol XL 25 mg daily,, lisinopril 10 mg daily,   may use IV Hydralazine 10 mg  Every 4 hours Prn for systolic blood pressure over 160 mmhg  5)History of CAD--no ACS type symptoms at this time, troponin is flat and close to patient's usual baseline, continue aspirin, pravastatin and metoprolol  6)HFpEF--- patient with history of chronic diastolic dysfunction CHF, no evidence of acute exacerbation at this time, elevated BNP noted,  but clinically and auscultatively no evidence of significant CHF exacerbation, chest x-ray also without significant pulmonary edema or pulmonary venous congestion   With History of - Reviewed by me  Past Medical History:  Diagnosis Date  . ANXIETY   . Arteriosclerotic cardiovascular disease (ASCVD)    Cath in 9/99-70% mid LAD with diffuse distal disease, 70% T1, 60% mid circumflex, 50% mid RCA, normal ejection fraction; negative stress nuclear in 07/2008  . Carpal tunnel syndrome   . Cerebrovascular disease    carotid bruits; no focal disease in 1999, 2002 and 2006  . Closed left fibular fracture 11/30/11  . COPD   . Diabetes mellitus    Insulin treatment  . Diastolic CHF, chronic (HCC)    Normal EF  . Gastroesophageal reflux disease    With hiatal hernia; irritable bowel syndrome; H. pylori-treated; Colonoscopy 2008: non-specific colitis, IH, Diverticulosis, Rectal ulcer secondary to ASA  . HOH (hard of hearing)   . Hyperlipidemia   . Hypertension   . Hypothyroidism   . OSTEOARTHRITIS, KNEES, BILATERAL    Bilateral TKA  . Pacemaker   . Peripheral neuropathy   . Sinoatrial node dysfunction (HCC) 2003   Medtronic dual-chamber device  . Sleep apnea    pt could not tolerate  .  VITAMIN B12 DEFICIENCY       Past Surgical History:  Procedure Laterality Date  . ABDOMINAL HYSTERECTOMY    . BLADDER REPAIR    . CARDIAC PACEMAKER PLACEMENT  2003   Medtronic, dual-chamber  . CARPAL TUNNEL RELEASE    . CATARACT EXTRACTION W/PHACO Left 08/09/2017   Procedure: CATARACT EXTRACTION PHACO AND INTRAOCULAR LENS PLACEMENT (IOC);  Surgeon: Fabio PierceWrzosek, James, MD;  Location: [facility];  Service: Ophthalmology;  Laterality: Left;  CDE: 6.26  . CHOLECYSTECTOMY    . COLONOSCOPY  2008  . HEMORRHOID SURGERY    . NASAL FRACTURE SURGERY    . THYROIDECTOMY  1980   Goiter  . TOTAL KNEE ARTHROPLASTY     Bilateral      Chief Complaint  Patient presents with  . Fall      HPI:    [name] [name]  is a  84 y.o. female past medical history relevant for hypothyroidism, CAD, status post pacemaker placement for cardiac arrhythmia, diabetes and hypertension who presents after being found down at home she apparently fell around 7 PM last evening she was found by family about 15 hours later........ patient believes she had a mechanical fall and just could not get up, no tongue biting or any concerns about seizures  In the ED imaging studies without acute fractures or other acute findings,  Patient denies chest pains palpitations or dizziness  At this time patient remains very weak unable to perform mobility related activities of daily living, patient will need physical therapy evaluation, she had no oral intake for over 18 hours now, patient will need gentle hydration with IV fluids,     Review of systems:    In addition to the HPI above,   A full Review of  Systems was done, all other systems reviewed are negative except as noted above in HPI , .    Social History:  Reviewed by me    Social History   Tobacco Use  . Smoking status: Never Smoker  . Smokeless tobacco: Never Used  Substance Use Topics  . Alcohol use: No    Alcohol/week: 0.0 standard drinks      Family History :  Reviewed by me    Family History  Problem Relation Age of Onset  . COPD Father   . Heart attack Father [id number]  . Heart failure Mother   . Cancer Sister        colon  . Cancer Brother        lung  . Cancer Sister        bladder  . Cancer Brother        lung  . Early death Son   . Diabetes Son   . Heart disease Son   . Cancer Son        prostate  . Cancer Sister        kidney cancer  . Diabetes Sister   . Cancer Daughter      Home Medications:   Prior to Admission medications   Medication Sig Start Date End Date Taking? Authorizing Provider  levothyroxine (SYNTHROID, LEVOTHROID) 25 MCG tablet TAKE 1 TABLET DAILY BEFORE BREAKFAST 07/26/17  Yes Hawks, Christy A, FNP  lisinopril  (PRINIVIL,ZESTRIL) 10 MG tablet TAKE 1 TABLET DAILY 02/21/18  Yes Hawks, Neysa Bonitohristy A, FNP  metoprolol succinate (TOPROL-XL) 25 MG 24 hr tablet TAKE (1) TABLET DAILY WITH FOOD. 12/13/17  Yes Hawks, Christy A, FNP  pravastatin (PRAVACHOL) 80 MG tablet TAKE 1 TABLET ONCE DAILY  FOR CHOLESTEROL 02/21/18  Yes Hawks, Christy A, FNP  sertraline (ZOLOFT) 100 MG tablet Take 1 tablet (100 mg total) by mouth daily. 11/13/17  Yes Hawks, Neysa Bonito A, FNP  sertraline (ZOLOFT) 50 MG tablet TAKE 1 TABLET DAILY 02/21/18  Yes Hawks, Christy A, FNP  ALPRAZolam (XANAX) 0.5 MG tablet Take 1 tablet (0.5 mg total) by mouth at bedtime as needed for anxiety. 11/13/17   Jannifer Rodney A, FNP  aspirin EC 81 MG tablet Take 81 mg by mouth daily. Reported on 12/14/2015    [provider]  budesonide-formoterol (SYMBICORT) 80-4.5 MCG/ACT inhaler 2 PUFFS 2 TIMES A DAY 08/07/17   Hawks, Neysa Bonito A, FNP  doxycycline (VIBRA-TABS) 100 MG tablet Take 1 tablet (100 mg total) by mouth 2 (two) times daily. 01/08/18   Junie Spencer, FNP  furosemide (LASIX) 40 MG tablet Take 1 tablet (40 mg total) by mouth 2 (two) times daily. 07/30/17   Strader, Lennart Pall, PA-C  gabapentin (NEURONTIN) 300 MG capsule TAKE (1) CAPSULE THREE TIMES DAILY. 01/20/18   Jannifer Rodney A, FNP  glucose blood (GLUCOSE METER TEST) test strip Use BID 07/11/17   Hawks, Neysa Bonito A, FNP  levothyroxine (SYNTHROID, LEVOTHROID) 200 MCG tablet TAKE (1) TABLET DAILY BE- FORE BREAKFAST. 03/29/17   Junie Spencer, FNP  lisinopril (PRINIVIL,ZESTRIL) 10 MG tablet TAKE 1 TABLET DAILY 11/26/16   Jannifer Rodney A, FNP  metFORMIN (GLUCOPHAGE) 1000 MG tablet TAKE (1) TABLET TWICE A DAY WITH MEALS (BREAKFAST AND SUPPER) FOR DIAB ETES 01/24/18   Hawks, Neysa Bonito A, FNP  nitroGLYCERIN (NITROLINGUAL) 0.4 MG/SPRAY spray Place 1 spray under the tongue every 5 (five) minutes as needed. 07/09/17   Strader, Lennart Pall, PA-C  nystatin-triamcinolone (MYCOLOG II) cream APPLY TO AFFECTED AREA 3 TIMES A DAY  12/28/17   Junie Spencer, FNP  PROAIR HFA 108 [phone] Base) MCG/ACT inhaler 2 PUFFS EVERY 6 HOURS AS NEEDED FOR WHEEZING 08/07/17   Junie Spencer, FNP     Allergies:     Allergies  Allergen Reactions  . Penicillins     Has patient had a PCN reaction causing immediate rash, facial/tongue/throat swelling, SOB or lightheadedness with hypotension: Unknown Has patient had a PCN reaction causing severe rash involving mucus membranes or skin necrosis: Unknown Has patient had a PCN reaction that required hospitalization: Unknown Has patient had a PCN reaction occurring within the last 10 years: No If all of the above answers are "NO", then may proceed with Cephalosporin use.      Physical Exam:   Vitals  Blood pressure (!) 150/76, pulse 64, temperature 97.8 F (36.6 C), temperature source Oral, resp. rate 20, height 5\' 10"  (1.778 m), weight 83.9 kg, SpO2 97 %.  Physical Examination: General appearance - alert, well appearing, and in no distress  Mental status - alert, oriented to person, place, and time,  Eyes - sclera anicteric Neck - supple, no JVD elevation , Chest - clear  to auscultation bilaterally, symmetrical air movement,  Heart - S1 and S2 normal, pacemaker in situ Abdomen - soft, nontender, nondistended, no masses or organomegaly Neurological - screening mental status exam normal, neck supple without rigidity, cranial nerves II through XII intact, DTR's normal and symmetric Extremities -intact peripheral pulses , bruises/ecchymosis from falls Skin - warm, dry, facial abrasions/bruises/ecchymosis from falls     Data Review:    CBC Recent Labs  Lab 02/23/18 1200  WBC 8.1  HGB 10.3*  HCT 30.5*  PLT 204  MCV 85.0  MCH  28.7  MCHC 33.8  RDW 14.5  LYMPHSABS 0.9  MONOABS 0.6  EOSABS 0.0  BASOSABS 0.0   ------------------------------------------------------------------------------------------------------------------  Chemistries  Recent Labs  Lab 02/23/18 1200    NA 125*  K 3.6  CL 88*  CO2 28  GLUCOSE 166*  BUN 11  CREATININE 0.77  CALCIUM 8.7*  AST 41  ALT 36  ALKPHOS 56  BILITOT 1.2   ------------------------------------------------------------------------------------------------------------------ estimated creatinine clearance is 62.8 mL/min (by C-G formula based on SCr of 0.77 mg/dL). ------------------------------------------------------------------------------------------------------------------ No results for input(s): TSH, T4TOTAL, T3FREE, THYROIDAB in the last 72 hours.  Invalid input(s): FREET3   Coagulation profile No results for input(s): INR, PROTIME in the last 168 hours. ------------------------------------------------------------------------------------------------------------------- No results for input(s): DDIMER in the last 72 hours. -------------------------------------------------------------------------------------------------------------------  Cardiac Enzymes Recent Labs  Lab 02/23/18 1200 02/23/18 1743  TROPONINI 0.04* 0.05*   ------------------------------------------------------------------------------------------------------------------    Component Value Date/Time   BNP 4,168.0 (H) 02/23/2018 1200     ---------------------------------------------------------------------------------------------------------------  Urinalysis    Component Value Date/Time   COLORURINE YELLOW 02/23/2018 1147   APPEARANCEUR HAZY (A) 02/23/2018 1147   APPEARANCEUR Clear 01/08/2018 1442   LABSPEC 1.012 02/23/2018 1147   PHURINE 6.0 02/23/2018 1147   GLUCOSEU NEGATIVE 02/23/2018 1147   GLUCOSEU NEGATIVE 12/23/2012 1110   HGBUR NEGATIVE 02/23/2018 1147   HGBUR large 05/16/2010 0746   BILIRUBINUR NEGATIVE 02/23/2018 1147   BILIRUBINUR Negative 01/08/2018 1442   KETONESUR NEGATIVE 02/23/2018 1147   PROTEINUR 100 (A) 02/23/2018 1147   UROBILINOGEN negative 07/05/2015 1431   UROBILINOGEN 0.2 12/23/2012 1110    NITRITE NEGATIVE 02/23/2018 1147   LEUKOCYTESUR MODERATE (A) 02/23/2018 1147   LEUKOCYTESUR 1+ (A) 01/08/2018 1442    ----------------------------------------------------------------------------------------------------------------   Imaging Results:    Dg Chest 1 View  Result Date: 02/23/2018 CLINICAL DATA:  Fall EXAM: CHEST  1 VIEW COMPARISON:  10/14/2017 FINDINGS: Small right pleural effusion. Associated right lower lobe opacity, likely atelectasis. Cardiomegaly. No frank interstitial edema. Left subclavian pacemaker. IMPRESSION: Small right pleural effusion. Associated right lower lobe opacity, likely atelectasis. Electronically Signed   By: Charline Bills M.D.   On: 02/23/2018 13:52   Dg Lumbar Spine Complete  Result Date: 02/23/2018 CLINICAL DATA:  Fall EXAM: LUMBAR SPINE - COMPLETE 4+ VIEW COMPARISON:  None. FINDINGS: Five lumbar-type vertebral bodies. Mild straightening of the lumbar spine. No evidence of fracture or dislocation. Vertebral body heights are maintained. Mild to moderate multilevel degenerative changes. Visualized bony pelvis appears intact. IMPRESSION: No fracture or dislocation is seen. Mild to moderate degenerative changes. Electronically Signed   By: Charline Bills M.D.   On: 02/23/2018 13:50   Ct Head Wo Contrast  Result Date: 02/23/2018 CLINICAL DATA:  Larey Seat.  Hit head. EXAM: CT HEAD WITHOUT CONTRAST CT MAXILLOFACIAL WITHOUT CONTRAST CT CERVICAL SPINE WITHOUT CONTRAST TECHNIQUE: Multidetector CT imaging of the head, cervical spine, and maxillofacial structures were performed using the standard protocol without intravenous contrast. Multiplanar CT image reconstructions of the cervical spine and maxillofacial structures were also generated. COMPARISON:  07/02/2017 FINDINGS: CT HEAD FINDINGS Brain: Stable age related cerebral atrophy, ventriculomegaly and periventricular white matter disease. No extra-axial fluid collections are identified. No CT findings for acute  hemispheric infarction or intracranial hemorrhage. No mass lesions. The brainstem and cerebellum are normal. Vascular: No hyperdense vessel or unexpected calcification. Skull: No acute skull fracture. Other: No scalp hematoma or obvious laceration. CT MAXILLOFACIAL FINDINGS Osseous: No acute facial bone fractures. The mandibular condyles are normally located. No mandible fracture. Orbits: No orbital  fracture.  The globes are intact. Sinuses: The paranasal sinuses and mastoid air cells are clear. Concha bullosa of the left middle turbinates noted and there is deviation of the bony nasal septum rightward. Soft tissues: No significant soft tissue swelling or hematoma. CT CERVICAL SPINE FINDINGS Alignment: Exam degraded by motion artifact. Grossly normal alignment. Skull base and vertebrae: No obvious fracture. Soft tissues and spinal canal: No abnormal prevertebral soft tissue swelling or canal hematoma. Disc levels: The spinal canal is generous. No spinal stenosis. Mild multilevel foraminal narrowing due to uncinate spurring. Upper chest: The lung apices are grossly clear. Other: Permanent left-sided pacemaker noted. Advanced carotid artery calcifications. IMPRESSION: 1. No acute intracranial findings or skull fracture. 2. No acute facial bone fractures. 3. Cervical spine images are degraded by motion artifact but grossly normal alignment and no obvious acute fracture. Electronically Signed   By: Rudie Meyer M.D.   On: 02/23/2018 13:44   Ct Cervical Spine Wo Contrast  Result Date: 02/23/2018 CLINICAL DATA:  Larey Seat.  Hit head. EXAM: CT HEAD WITHOUT CONTRAST CT MAXILLOFACIAL WITHOUT CONTRAST CT CERVICAL SPINE WITHOUT CONTRAST TECHNIQUE: Multidetector CT imaging of the head, cervical spine, and maxillofacial structures were performed using the standard protocol without intravenous contrast. Multiplanar CT image reconstructions of the cervical spine and maxillofacial structures were also generated. COMPARISON:   07/02/2017 FINDINGS: CT HEAD FINDINGS Brain: Stable age related cerebral atrophy, ventriculomegaly and periventricular white matter disease. No extra-axial fluid collections are identified. No CT findings for acute hemispheric infarction or intracranial hemorrhage. No mass lesions. The brainstem and cerebellum are normal. Vascular: No hyperdense vessel or unexpected calcification. Skull: No acute skull fracture. Other: No scalp hematoma or obvious laceration. CT MAXILLOFACIAL FINDINGS Osseous: No acute facial bone fractures. The mandibular condyles are normally located. No mandible fracture. Orbits: No orbital fracture.  The globes are intact. Sinuses: The paranasal sinuses and mastoid air cells are clear. Concha bullosa of the left middle turbinates noted and there is deviation of the bony nasal septum rightward. Soft tissues: No significant soft tissue swelling or hematoma. CT CERVICAL SPINE FINDINGS Alignment: Exam degraded by motion artifact. Grossly normal alignment. Skull base and vertebrae: No obvious fracture. Soft tissues and spinal canal: No abnormal prevertebral soft tissue swelling or canal hematoma. Disc levels: The spinal canal is generous. No spinal stenosis. Mild multilevel foraminal narrowing due to uncinate spurring. Upper chest: The lung apices are grossly clear. Other: Permanent left-sided pacemaker noted. Advanced carotid artery calcifications. IMPRESSION: 1. No acute intracranial findings or skull fracture. 2. No acute facial bone fractures. 3. Cervical spine images are degraded by motion artifact but grossly normal alignment and no obvious acute fracture. Electronically Signed   By: Rudie Meyer M.D.   On: 02/23/2018 13:44   Dg Hip Unilat W Or Wo Pelvis 2-3 Views Right  Result Date: 02/23/2018 CLINICAL DATA:  Fall EXAM: DG HIP (WITH OR WITHOUT PELVIS) 2-3V RIGHT COMPARISON:  None. FINDINGS: No fracture or dislocation is seen. Bilateral hip joint spaces are preserved. Visualized bony  pelvis appears intact. Degenerative changes of the lower lumbar spine. IMPRESSION: Negative. Electronically Signed   By: Charline Bills M.D.   On: 02/23/2018 13:51   Ct Maxillofacial Wo Contrast  Result Date: 02/23/2018 CLINICAL DATA:  Larey Seat.  Hit head. EXAM: CT HEAD WITHOUT CONTRAST CT MAXILLOFACIAL WITHOUT CONTRAST CT CERVICAL SPINE WITHOUT CONTRAST TECHNIQUE: Multidetector CT imaging of the head, cervical spine, and maxillofacial structures were performed using the standard protocol without intravenous contrast. Multiplanar CT image reconstructions  of the cervical spine and maxillofacial structures were also generated. COMPARISON:  07/02/2017 FINDINGS: CT HEAD FINDINGS Brain: Stable age related cerebral atrophy, ventriculomegaly and periventricular white matter disease. No extra-axial fluid collections are identified. No CT findings for acute hemispheric infarction or intracranial hemorrhage. No mass lesions. The brainstem and cerebellum are normal. Vascular: No hyperdense vessel or unexpected calcification. Skull: No acute skull fracture. Other: No scalp hematoma or obvious laceration. CT MAXILLOFACIAL FINDINGS Osseous: No acute facial bone fractures. The mandibular condyles are normally located. No mandible fracture. Orbits: No orbital fracture.  The globes are intact. Sinuses: The paranasal sinuses and mastoid air cells are clear. Concha bullosa of the left middle turbinates noted and there is deviation of the bony nasal septum rightward. Soft tissues: No significant soft tissue swelling or hematoma. CT CERVICAL SPINE FINDINGS Alignment: Exam degraded by motion artifact. Grossly normal alignment. Skull base and vertebrae: No obvious fracture. Soft tissues and spinal canal: No abnormal prevertebral soft tissue swelling or canal hematoma. Disc levels: The spinal canal is generous. No spinal stenosis. Mild multilevel foraminal narrowing due to uncinate spurring. Upper chest: The lung apices are grossly  clear. Other: Permanent left-sided pacemaker noted. Advanced carotid artery calcifications. IMPRESSION: 1. No acute intracranial findings or skull fracture. 2. No acute facial bone fractures. 3. Cervical spine images are degraded by motion artifact but grossly normal alignment and no obvious acute fracture. Electronically Signed   By: Rudie Meyer M.D.   On: 02/23/2018 13:44    Radiological Exams on Admission: Dg Chest 1 View  Result Date: 02/23/2018 CLINICAL DATA:  Fall EXAM: CHEST  1 VIEW COMPARISON:  10/14/2017 FINDINGS: Small right pleural effusion. Associated right lower lobe opacity, likely atelectasis. Cardiomegaly. No frank interstitial edema. Left subclavian pacemaker. IMPRESSION: Small right pleural effusion. Associated right lower lobe opacity, likely atelectasis. Electronically Signed   By: Charline Bills M.D.   On: 02/23/2018 13:52   Dg Lumbar Spine Complete  Result Date: 02/23/2018 CLINICAL DATA:  Fall EXAM: LUMBAR SPINE - COMPLETE 4+ VIEW COMPARISON:  None. FINDINGS: Five lumbar-type vertebral bodies. Mild straightening of the lumbar spine. No evidence of fracture or dislocation. Vertebral body heights are maintained. Mild to moderate multilevel degenerative changes. Visualized bony pelvis appears intact. IMPRESSION: No fracture or dislocation is seen. Mild to moderate degenerative changes. Electronically Signed   By: Charline Bills M.D.   On: 02/23/2018 13:50   Ct Head Wo Contrast  Result Date: 02/23/2018 CLINICAL DATA:  Larey Seat.  Hit head. EXAM: CT HEAD WITHOUT CONTRAST CT MAXILLOFACIAL WITHOUT CONTRAST CT CERVICAL SPINE WITHOUT CONTRAST TECHNIQUE: Multidetector CT imaging of the head, cervical spine, and maxillofacial structures were performed using the standard protocol without intravenous contrast. Multiplanar CT image reconstructions of the cervical spine and maxillofacial structures were also generated. COMPARISON:  07/02/2017 FINDINGS: CT HEAD FINDINGS Brain: Stable age  related cerebral atrophy, ventriculomegaly and periventricular white matter disease. No extra-axial fluid collections are identified. No CT findings for acute hemispheric infarction or intracranial hemorrhage. No mass lesions. The brainstem and cerebellum are normal. Vascular: No hyperdense vessel or unexpected calcification. Skull: No acute skull fracture. Other: No scalp hematoma or obvious laceration. CT MAXILLOFACIAL FINDINGS Osseous: No acute facial bone fractures. The mandibular condyles are normally located. No mandible fracture. Orbits: No orbital fracture.  The globes are intact. Sinuses: The paranasal sinuses and mastoid air cells are clear. Concha bullosa of the left middle turbinates noted and there is deviation of the bony nasal septum rightward. Soft tissues: No significant soft  tissue swelling or hematoma. CT CERVICAL SPINE FINDINGS Alignment: Exam degraded by motion artifact. Grossly normal alignment. Skull base and vertebrae: No obvious fracture. Soft tissues and spinal canal: No abnormal prevertebral soft tissue swelling or canal hematoma. Disc levels: The spinal canal is generous. No spinal stenosis. Mild multilevel foraminal narrowing due to uncinate spurring. Upper chest: The lung apices are grossly clear. Other: Permanent left-sided pacemaker noted. Advanced carotid artery calcifications. IMPRESSION: 1. No acute intracranial findings or skull fracture. 2. No acute facial bone fractures. 3. Cervical spine images are degraded by motion artifact but grossly normal alignment and no obvious acute fracture. Electronically Signed   By: Rudie Meyer M.D.   On: 02/23/2018 13:44   Ct Cervical Spine Wo Contrast  Result Date: 02/23/2018 CLINICAL DATA:  Larey Seat.  Hit head. EXAM: CT HEAD WITHOUT CONTRAST CT MAXILLOFACIAL WITHOUT CONTRAST CT CERVICAL SPINE WITHOUT CONTRAST TECHNIQUE: Multidetector CT imaging of the head, cervical spine, and maxillofacial structures were performed using the standard protocol  without intravenous contrast. Multiplanar CT image reconstructions of the cervical spine and maxillofacial structures were also generated. COMPARISON:  07/02/2017 FINDINGS: CT HEAD FINDINGS Brain: Stable age related cerebral atrophy, ventriculomegaly and periventricular white matter disease. No extra-axial fluid collections are identified. No CT findings for acute hemispheric infarction or intracranial hemorrhage. No mass lesions. The brainstem and cerebellum are normal. Vascular: No hyperdense vessel or unexpected calcification. Skull: No acute skull fracture. Other: No scalp hematoma or obvious laceration. CT MAXILLOFACIAL FINDINGS Osseous: No acute facial bone fractures. The mandibular condyles are normally located. No mandible fracture. Orbits: No orbital fracture.  The globes are intact. Sinuses: The paranasal sinuses and mastoid air cells are clear. Concha bullosa of the left middle turbinates noted and there is deviation of the bony nasal septum rightward. Soft tissues: No significant soft tissue swelling or hematoma. CT CERVICAL SPINE FINDINGS Alignment: Exam degraded by motion artifact. Grossly normal alignment. Skull base and vertebrae: No obvious fracture. Soft tissues and spinal canal: No abnormal prevertebral soft tissue swelling or canal hematoma. Disc levels: The spinal canal is generous. No spinal stenosis. Mild multilevel foraminal narrowing due to uncinate spurring. Upper chest: The lung apices are grossly clear. Other: Permanent left-sided pacemaker noted. Advanced carotid artery calcifications. IMPRESSION: 1. No acute intracranial findings or skull fracture. 2. No acute facial bone fractures. 3. Cervical spine images are degraded by motion artifact but grossly normal alignment and no obvious acute fracture. Electronically Signed   By: Rudie Meyer M.D.   On: 02/23/2018 13:44   Dg Hip Unilat W Or Wo Pelvis 2-3 Views Right  Result Date: 02/23/2018 CLINICAL DATA:  Fall EXAM: DG HIP (WITH OR  WITHOUT PELVIS) 2-3V RIGHT COMPARISON:  None. FINDINGS: No fracture or dislocation is seen. Bilateral hip joint spaces are preserved. Visualized bony pelvis appears intact. Degenerative changes of the lower lumbar spine. IMPRESSION: Negative. Electronically Signed   By: Charline Bills M.D.   On: 02/23/2018 13:51   Ct Maxillofacial Wo Contrast  Result Date: 02/23/2018 CLINICAL DATA:  Larey Seat.  Hit head. EXAM: CT HEAD WITHOUT CONTRAST CT MAXILLOFACIAL WITHOUT CONTRAST CT CERVICAL SPINE WITHOUT CONTRAST TECHNIQUE: Multidetector CT imaging of the head, cervical spine, and maxillofacial structures were performed using the standard protocol without intravenous contrast. Multiplanar CT image reconstructions of the cervical spine and maxillofacial structures were also generated. COMPARISON:  07/02/2017 FINDINGS: CT HEAD FINDINGS Brain: Stable age related cerebral atrophy, ventriculomegaly and periventricular white matter disease. No extra-axial fluid collections are identified. No CT findings for  acute hemispheric infarction or intracranial hemorrhage. No mass lesions. The brainstem and cerebellum are normal. Vascular: No hyperdense vessel or unexpected calcification. Skull: No acute skull fracture. Other: No scalp hematoma or obvious laceration. CT MAXILLOFACIAL FINDINGS Osseous: No acute facial bone fractures. The mandibular condyles are normally located. No mandible fracture. Orbits: No orbital fracture.  The globes are intact. Sinuses: The paranasal sinuses and mastoid air cells are clear. Concha bullosa of the left middle turbinates noted and there is deviation of the bony nasal septum rightward. Soft tissues: No significant soft tissue swelling or hematoma. CT CERVICAL SPINE FINDINGS Alignment: Exam degraded by motion artifact. Grossly normal alignment. Skull base and vertebrae: No obvious fracture. Soft tissues and spinal canal: No abnormal prevertebral soft tissue swelling or canal hematoma. Disc levels: The  spinal canal is generous. No spinal stenosis. Mild multilevel foraminal narrowing due to uncinate spurring. Upper chest: The lung apices are grossly clear. Other: Permanent left-sided pacemaker noted. Advanced carotid artery calcifications. IMPRESSION: 1. No acute intracranial findings or skull fracture. 2. No acute facial bone fractures. 3. Cervical spine images are degraded by motion artifact but grossly normal alignment and no obvious acute fracture. Electronically Signed   By: Rudie Meyer M.D.   On: 02/23/2018 13:44    DVT Prophylaxis -SCD   AM Labs Ordered, also please review Full Orders  Family Communication: Admission, patients condition and plan of care including tests being ordered have been discussed with the patient who indicate understanding and agree with the plan   Code Status - Full Code  Likely DC to depending on physical therapy eval  Condition   stable  Shon Hale M.D on 02/23/2018 at 7:22 PM Pager---[phone] Go to [email or web address] - password [email or web address] for contact info  [facility] - Office  [phone]

## 2018-02-23 NOTE — ED Notes (Signed)
Pacemaker interrogated. 

## 2018-02-23 NOTE — ED Notes (Signed)
Pt understands we need a urine sample. Unable to get sample at this time. Pt will notify us when she is able to give sample

## 2018-02-23 NOTE — ED Triage Notes (Signed)
Pt reports she was locking her house last night around 8pm.  Reports she woke up in the floor and doesn't remember falling.  EMS reports pt fell another time recently and struck head on night stand.   Pt has multiple bruises to face.  Today pt c/o pain in r hip but was ambulatory.  Family got pt out of floor and put her on couch before ems arrived.  Pt hasn't had breakfast or meds today.  Family told ems pt had uti last month and took antibiotics.  Reports uti is unresolved.  PT presently alert and oriented.  Pt reports generalized weakness.  CBG 186.  bp 172/92 pta.

## 2018-02-23 NOTE — ED Notes (Signed)
Patient assisted with bedpan, tolerated well. Patient had large BM. Peri care done.

## 2018-02-24 DIAGNOSIS — Y92009 Unspecified place in unspecified non-institutional (private) residence as the place of occurrence of the external cause: Secondary | ICD-10-CM | POA: Diagnosis not present

## 2018-02-24 DIAGNOSIS — W19XXXA Unspecified fall, initial encounter: Secondary | ICD-10-CM | POA: Diagnosis not present

## 2018-02-24 DIAGNOSIS — I10 Essential (primary) hypertension: Secondary | ICD-10-CM | POA: Diagnosis not present

## 2018-02-24 LAB — CBC
HEMATOCRIT: 25.3 % — AB (ref 36.0–46.0)
Hemoglobin: 8.3 g/dL — ABNORMAL LOW (ref 12.0–15.0)
MCH: 28.5 pg (ref 26.0–34.0)
MCHC: 32.8 g/dL (ref 30.0–36.0)
MCV: 86.9 fL (ref 78.0–100.0)
Platelets: 184 10*3/uL (ref 150–400)
RBC: 2.91 MIL/uL — ABNORMAL LOW (ref 3.87–5.11)
RDW: 13.9 % (ref 11.5–15.5)
WBC: 6.9 10*3/uL (ref 4.0–10.5)

## 2018-02-24 LAB — TROPONIN I
Troponin I: 0.04 ng/mL (ref <0.03)
Troponin I: 0.05 ng/mL (ref <0.03)

## 2018-02-24 LAB — GLUCOSE, CAPILLARY
GLUCOSE-CAPILLARY: 142 mg/dL — AB (ref 70–99)
Glucose-Capillary: 176 mg/dL — ABNORMAL HIGH (ref 70–99)
Glucose-Capillary: 134 mg/dL — ABNORMAL HIGH (ref 70–99)
Glucose-Capillary: 125 mg/dL — ABNORMAL HIGH (ref 70–99)

## 2018-02-24 LAB — BASIC METABOLIC PANEL
ANION GAP: 7 (ref 5–15)
BUN: 10 mg/dL (ref 8–23)
CO2: 28 mmol/L (ref 22–32)
Calcium: 8.3 mg/dL — ABNORMAL LOW (ref 8.9–10.3)
Chloride: 92 mmol/L — ABNORMAL LOW (ref 98–111)
Creatinine, Ser: 0.72 mg/dL (ref 0.44–1.00)
GFR calc Af Amer: >60 mL/min (ref >60)
Glucose, Bld: 148 mg/dL — ABNORMAL HIGH (ref 70–99)
POTASSIUM: 3.8 mmol/L (ref 3.5–5.1)
SODIUM: 127 mmol/L — AB (ref 135–145)

## 2018-02-24 LAB — MRSA PCR SCREENING: MRSA BY PCR: NEGATIVE

## 2018-02-24 MED ORDER — TAMSULOSIN HCL 0.4 MG PO CAPS
0.4000 mg | ORAL_CAPSULE | Freq: Every day | ORAL | Status: DC
  Administered 2018-02-24: 0.4 mg via ORAL
  Filled 2018-02-24: qty 1

## 2018-02-24 NOTE — Progress Notes (Signed)
Patient Demographics:    [name] [name], is a 84 y.o. female, DOB - 1933/01/18, RUE:[id number]  Admit date - 02/23/2018   Admitting Physician Dariela Stoker Mariea Clonts, MD  Outpatient Primary MD for the patient is Junie Spencer, FNP  LOS - 0   Chief Complaint  Patient presents with  . Fall        Subjective:    [name] [name] today has no fevers, no emesis,  No chest pain, complains of generalized weakness and fatigue,  Assessment  & Plan :    Principal Problem:   Fall at home, initial encounter Active Problems:   Hypothyroid   Vitamin B 12 deficiency   GAD (generalized anxiety disorder)   Hypertension associated with diabetes (HCC)   Chronic diastolic heart failure (HCC)   PPM-Medtronic   Elevated troponin   Sleep apnea   Fall   Brief Summary 84 year old female with past medical history relevant for hypothyroidism, CAD, status post pacemaker placement for cardiac dysrhythmia as well as hypertension and diabetic admitted on 02/23/2018 after being found at home on the floor laying there for at least 15 hours after falling.   Plan:- 1)Fall at home/Generalized weakness/debility- physical therapy evaluation appreciated,, no acute fractures or serious injuries identified, need for SNF rehab prior to returning home.  Patient remains very weak unable to perform mobility related activities of daily living, multiple abrasions/ecchymosis/bruises at different stages of healing from head to toe suggesting multiple falls at home   2)H/o arrhythmia/sick sinus syndrome--status post Medtronic dual-chamber pacemaker placement, pacemaker interrogated by Medtronic tech at this time no significant abnormalities noted recently  3)DM2-excellent control, recent A1c 6.2, continue metformin 1 g twice daily with meals, Use Novolog/Humalog Sliding scale insulin with Accu-Cheks/Fingersticks as ordered   4)HTN- c/n metoprolol XL  25 mg daily,, lisinopril 10 mg daily,   may use IV Hydralazine 10 mg  Every 4 hours Prn for systolic blood pressure over 160 mmhg  5)History of CAD--no ACS type symptoms at this time, troponin is flat and close to patient's usual baseline, continue aspirin, pravastatin and metoprolol  6)HFpEF--- patient with history of chronic diastolic dysfunction CHF, no evidence of acute exacerbation at this time, elevated BNP noted, but clinically and auscultatively no evidence of significant CHF exacerbation, chest x-ray also without significant pulmonary edema or pulmonary venous congestion  7) Candida intertrigo----okay to use Mycolog ointment  8) hyponatremia--- sodium is up to 127, continue gentle hydration with normal saline, avoid excessive free water  Disposition/Need for in-Hospital Stay- patient unable to be discharged at this time due to need for SNF rehab prior to returning home.  Patient remains very weak unable to perform mobility related activities of daily living, physical therapy evaluation appreciated, she had no oral intake for over 18 hours now, patient will need gentle hydration with IV fluids  Code Status : full    Disposition Plan  : SNF Rehab  Consults  :  PT/SW   DVT Prophylaxis  :   Heparin   Lab Results  Component Value Date   PLT 184 02/24/2018    Inpatient Medications  Scheduled Meds: . aspirin EC  81 mg Oral Daily  . gabapentin  300 mg Oral TID  . heparin  5,000 Units Subcutaneous Q8H  .  insulin aspart  0-5 Units Subcutaneous QHS  . insulin aspart  0-9 Units Subcutaneous TID WC  . levothyroxine  25 mcg Oral QAC breakfast  . lisinopril  10 mg Oral Daily  . metFORMIN  1,000 mg Oral BID WC  . metoprolol succinate  25 mg Oral Daily  . mometasone-formoterol  2 puff Inhalation BID  . nystatin-triamcinolone   Topical BID  . pravastatin  80 mg Oral Daily  . sertraline  100 mg Oral Daily  . sodium chloride flush  3 mL Intravenous Q12H  . tamsulosin  0.4 mg Oral  QPC supper   Continuous Infusions: . sodium chloride    . sodium chloride 50 mL/hr at 02/24/18 1500   PRN Meds:.sodium chloride, acetaminophen **OR** acetaminophen, albuterol, ALPRAZolam, hydrALAZINE, nitroGLYCERIN, ondansetron **OR** ondansetron (ZOFRAN) IV, polyethylene glycol, sodium chloride flush, traZODone    Anti-infectives (From admission, onward)   None        Objective:   Vitals:   02/24/18 0601 02/24/18 0748 02/24/18 0855 02/24/18 1614  BP: (!) 165/61   (!) 145/69  Pulse: 64  70 61  Resp: 20   16  Temp: (!) 97.4 F (36.3 C)   98.3 F (36.8 C)  TempSrc: Oral   Oral  SpO2: 97% 96%  100%  Weight:      Height:        Wt Readings from Last 3 Encounters:  02/23/18 83.9 kg  01/08/18 81.6 kg  11/13/17 83.7 kg     Intake/Output Summary (Last 24 hours) at 02/24/2018 1817 Last data filed at 02/24/2018 1500 Gross per 24 hour  Intake 947.81 ml  Output 1000 ml  Net -52.19 ml     Physical Exam Patient is examined daily including today on 02/24/18 , exams remain the same as of yesterday except that has changed   Gen:- Awake Alert,  In no apparent distress  HEENT:- [place].AT, No sclera icterus Neck-Supple Neck,No JVD,.  Lungs-  CTAB , good air movement CV- S1, S2 normal Abd-  +ve B.Sounds, Abd Soft, No tenderness,    Extremity/Skin:-Good pulses, multiple abrasions/ecchymosis/bruises at different stages of healing from head to toe suggesting multiple falls at home Psych-affect is appropriate, oriented x3 Neuro-generalized weakness without new focal deficits, no tremors   Data Review:   Micro Results Recent Results (from the past 240 hour(s))  MRSA PCR Screening     Status: None   Collection Time: 02/24/18  6:44 AM  Result Value Ref Range Status   MRSA by PCR NEGATIVE NEGATIVE Final    Comment:        The GeneXpert MRSA Assay (FDA approved for NASAL specimens only), is one component of a comprehensive MRSA colonization surveillance program. It is  not intended to diagnose MRSA infection nor to guide or monitor treatment for MRSA infections. Performed at [facility], [address] [place], [place] [address]     Radiology Reports Dg Chest 1 View  Result Date: 02/23/2018 CLINICAL DATA:  Fall EXAM: CHEST  1 VIEW COMPARISON:  10/14/2017 FINDINGS: Small right pleural effusion. Associated right lower lobe opacity, likely atelectasis. Cardiomegaly. No frank interstitial edema. Left subclavian pacemaker. IMPRESSION: Small right pleural effusion. Associated right lower lobe opacity, likely atelectasis. Electronically Signed   By: Charline Bills M.D.   On: 02/23/2018 13:52   Dg Lumbar Spine Complete  Result Date: 02/23/2018 CLINICAL DATA:  Fall EXAM: LUMBAR SPINE - COMPLETE 4+ VIEW COMPARISON:  None. FINDINGS: Five lumbar-type vertebral bodies. Mild straightening of the lumbar spine.  No evidence of fracture or dislocation. Vertebral body heights are maintained. Mild to moderate multilevel degenerative changes. Visualized bony pelvis appears intact. IMPRESSION: No fracture or dislocation is seen. Mild to moderate degenerative changes. Electronically Signed   By: Charline Bills M.D.   On: 02/23/2018 13:50   Ct Head Wo Contrast  Result Date: 02/23/2018 CLINICAL DATA:  Larey Seat.  Hit head. EXAM: CT HEAD WITHOUT CONTRAST CT MAXILLOFACIAL WITHOUT CONTRAST CT CERVICAL SPINE WITHOUT CONTRAST TECHNIQUE: Multidetector CT imaging of the head, cervical spine, and maxillofacial structures were performed using the standard protocol without intravenous contrast. Multiplanar CT image reconstructions of the cervical spine and maxillofacial structures were also generated. COMPARISON:  07/02/2017 FINDINGS: CT HEAD FINDINGS Brain: Stable age related cerebral atrophy, ventriculomegaly and periventricular white matter disease. No extra-axial fluid collections are identified. No CT findings for acute hemispheric infarction or intracranial hemorrhage. No mass lesions.  The brainstem and cerebellum are normal. Vascular: No hyperdense vessel or unexpected calcification. Skull: No acute skull fracture. Other: No scalp hematoma or obvious laceration. CT MAXILLOFACIAL FINDINGS Osseous: No acute facial bone fractures. The mandibular condyles are normally located. No mandible fracture. Orbits: No orbital fracture.  The globes are intact. Sinuses: The paranasal sinuses and mastoid air cells are clear. Concha bullosa of the left middle turbinates noted and there is deviation of the bony nasal septum rightward. Soft tissues: No significant soft tissue swelling or hematoma. CT CERVICAL SPINE FINDINGS Alignment: Exam degraded by motion artifact. Grossly normal alignment. Skull base and vertebrae: No obvious fracture. Soft tissues and spinal canal: No abnormal prevertebral soft tissue swelling or canal hematoma. Disc levels: The spinal canal is generous. No spinal stenosis. Mild multilevel foraminal narrowing due to uncinate spurring. Upper chest: The lung apices are grossly clear. Other: Permanent left-sided pacemaker noted. Advanced carotid artery calcifications. IMPRESSION: 1. No acute intracranial findings or skull fracture. 2. No acute facial bone fractures. 3. Cervical spine images are degraded by motion artifact but grossly normal alignment and no obvious acute fracture. Electronically Signed   By: Rudie Meyer M.D.   On: 02/23/2018 13:44   Ct Cervical Spine Wo Contrast  Result Date: 02/23/2018 CLINICAL DATA:  Larey Seat.  Hit head. EXAM: CT HEAD WITHOUT CONTRAST CT MAXILLOFACIAL WITHOUT CONTRAST CT CERVICAL SPINE WITHOUT CONTRAST TECHNIQUE: Multidetector CT imaging of the head, cervical spine, and maxillofacial structures were performed using the standard protocol without intravenous contrast. Multiplanar CT image reconstructions of the cervical spine and maxillofacial structures were also generated. COMPARISON:  07/02/2017 FINDINGS: CT HEAD FINDINGS Brain: Stable age related cerebral  atrophy, ventriculomegaly and periventricular white matter disease. No extra-axial fluid collections are identified. No CT findings for acute hemispheric infarction or intracranial hemorrhage. No mass lesions. The brainstem and cerebellum are normal. Vascular: No hyperdense vessel or unexpected calcification. Skull: No acute skull fracture. Other: No scalp hematoma or obvious laceration. CT MAXILLOFACIAL FINDINGS Osseous: No acute facial bone fractures. The mandibular condyles are normally located. No mandible fracture. Orbits: No orbital fracture.  The globes are intact. Sinuses: The paranasal sinuses and mastoid air cells are clear. Concha bullosa of the left middle turbinates noted and there is deviation of the bony nasal septum rightward. Soft tissues: No significant soft tissue swelling or hematoma. CT CERVICAL SPINE FINDINGS Alignment: Exam degraded by motion artifact. Grossly normal alignment. Skull base and vertebrae: No obvious fracture. Soft tissues and spinal canal: No abnormal prevertebral soft tissue swelling or canal hematoma. Disc levels: The spinal canal is generous. No spinal stenosis. Mild multilevel foraminal  narrowing due to uncinate spurring. Upper chest: The lung apices are grossly clear. Other: Permanent left-sided pacemaker noted. Advanced carotid artery calcifications. IMPRESSION: 1. No acute intracranial findings or skull fracture. 2. No acute facial bone fractures. 3. Cervical spine images are degraded by motion artifact but grossly normal alignment and no obvious acute fracture. Electronically Signed   By: Rudie Meyer M.D.   On: 02/23/2018 13:44   Dg Hip Unilat W Or Wo Pelvis 2-3 Views Right  Result Date: 02/23/2018 CLINICAL DATA:  Fall EXAM: DG HIP (WITH OR WITHOUT PELVIS) 2-3V RIGHT COMPARISON:  None. FINDINGS: No fracture or dislocation is seen. Bilateral hip joint spaces are preserved. Visualized bony pelvis appears intact. Degenerative changes of the lower lumbar spine.  IMPRESSION: Negative. Electronically Signed   By: Charline Bills M.D.   On: 02/23/2018 13:51   Ct Maxillofacial Wo Contrast  Result Date: 02/23/2018 CLINICAL DATA:  Larey Seat.  Hit head. EXAM: CT HEAD WITHOUT CONTRAST CT MAXILLOFACIAL WITHOUT CONTRAST CT CERVICAL SPINE WITHOUT CONTRAST TECHNIQUE: Multidetector CT imaging of the head, cervical spine, and maxillofacial structures were performed using the standard protocol without intravenous contrast. Multiplanar CT image reconstructions of the cervical spine and maxillofacial structures were also generated. COMPARISON:  07/02/2017 FINDINGS: CT HEAD FINDINGS Brain: Stable age related cerebral atrophy, ventriculomegaly and periventricular white matter disease. No extra-axial fluid collections are identified. No CT findings for acute hemispheric infarction or intracranial hemorrhage. No mass lesions. The brainstem and cerebellum are normal. Vascular: No hyperdense vessel or unexpected calcification. Skull: No acute skull fracture. Other: No scalp hematoma or obvious laceration. CT MAXILLOFACIAL FINDINGS Osseous: No acute facial bone fractures. The mandibular condyles are normally located. No mandible fracture. Orbits: No orbital fracture.  The globes are intact. Sinuses: The paranasal sinuses and mastoid air cells are clear. Concha bullosa of the left middle turbinates noted and there is deviation of the bony nasal septum rightward. Soft tissues: No significant soft tissue swelling or hematoma. CT CERVICAL SPINE FINDINGS Alignment: Exam degraded by motion artifact. Grossly normal alignment. Skull base and vertebrae: No obvious fracture. Soft tissues and spinal canal: No abnormal prevertebral soft tissue swelling or canal hematoma. Disc levels: The spinal canal is generous. No spinal stenosis. Mild multilevel foraminal narrowing due to uncinate spurring. Upper chest: The lung apices are grossly clear. Other: Permanent left-sided pacemaker noted. Advanced carotid artery  calcifications. IMPRESSION: 1. No acute intracranial findings or skull fracture. 2. No acute facial bone fractures. 3. Cervical spine images are degraded by motion artifact but grossly normal alignment and no obvious acute fracture. Electronically Signed   By: Rudie Meyer M.D.   On: 02/23/2018 13:44     CBC Recent Labs  Lab 02/23/18 1200 02/24/18 0544  WBC 8.1 6.9  HGB 10.3* 8.3*  HCT 30.5* 25.3*  PLT 204 184  MCV 85.0 86.9  MCH 28.7 28.5  MCHC 33.8 32.8  RDW 14.5 13.9  LYMPHSABS 0.9  --   MONOABS 0.6  --   EOSABS 0.0  --   BASOSABS 0.0  --     Chemistries  Recent Labs  Lab 02/23/18 1200 02/24/18 0544  NA 125* 127*  K 3.6 3.8  CL 88* 92*  CO2 28 28  GLUCOSE 166* 148*  BUN 11 10  CREATININE 0.77 0.72  CALCIUM 8.7* 8.3*  AST 41  --   ALT 36  --   ALKPHOS 56  --   BILITOT 1.2  --    ------------------------------------------------------------------------------------------------------------------ No results for input(s): CHOL, HDL,  LDLCALC, TRIG, CHOLHDL, LDLDIRECT in the last 72 hours.  Lab Results  Component Value Date   HGBA1C 6.2 (H) 07/02/2017   ------------------------------------------------------------------------------------------------------------------ No results for input(s): TSH, T4TOTAL, T3FREE, THYROIDAB in the last 72 hours.  Invalid input(s): FREET3 ------------------------------------------------------------------------------------------------------------------ No results for input(s): VITAMINB12, FOLATE, FERRITIN, TIBC, IRON, RETICCTPCT in the last 72 hours.  Coagulation profile No results for input(s): INR, PROTIME in the last 168 hours.  No results for input(s): DDIMER in the last 72 hours.  Cardiac Enzymes Recent Labs  Lab 02/23/18 1743 02/23/18 2345 02/24/18 0544  TROPONINI 0.05* 0.05* 0.04*   ------------------------------------------------------------------------------------------------------------------    Component Value  Date/Time   BNP 4,168.0 (H) 02/23/2018 1200     Shon Haleourage Murna Backer M.D on 02/24/2018 at 6:17 PM  Pager---[phone] Go to [email or web address] - password [email or web address] for contact info  [facility] - Office  [phone]

## 2018-02-24 NOTE — Evaluation (Signed)
Physical Therapy Evaluation Patient Details Name: [name] [name] MRN: [id number] DOB: [birth year] Today's Date: 02/24/2018   History of Present Illness  [name] [name]  is a 84 y.o. female past medical history relevant for hypothyroidism, CAD, status post pacemaker placement for cardiac arrhythmia, diabetes and hypertension who presents after being found down at home she apparently fell around 7 PM last evening she was found by family about 15 hours later........ patient believes she had a mechanical fall and just could not get up, no tongue biting or any concerns about seizures    Clinical Impression  Patient demonstrates slow labored movement for sitting up at, requires assist for sit to stands due to BLE weakness, limited to ambulating to bathroom and back to bedside due to fatigue after sitting on commode to finish a bowel movement, patient incontinent of stool upon standing.  Patient tolerated sitting up in chair after therapy - RN aware.  Patient will benefit from continued physical therapy in hospital and recommended venue below to increase strength, balance, endurance for safe ADLs and gait.     Follow Up Recommendations SNF;Supervision/Assistance - 24 hour;Supervision for mobility/OOB    Equipment Recommendations  None recommended by PT    Recommendations for Other Services       Precautions / Restrictions Precautions Precautions: Fall Restrictions Weight Bearing Restrictions: No      Mobility  Bed Mobility Overal bed mobility: Needs Assistance Bed Mobility: Supine to Sit     Supine to sit: Min assist;Min guard     General bed mobility comments: slow labored movement  Transfers Overall transfer level: Needs assistance Equipment used: Rolling walker (2 wheeled) Transfers: Sit to/from [employer] Sit to Stand: Min assist;Mod assist Stand pivot transfers: Min assist       General transfer comment: labored movement  Ambulation/Gait Ambulation/Gait  assistance: Min assist Gait Distance (Feet): 15 Feet Assistive device: Rolling walker (2 wheeled) Gait Pattern/deviations: Decreased step length - right;Decreased step length - left;Decreased stride length Gait velocity: slow   General Gait Details: slow labored cadence with occasional unsteadiness, limited secondary to c/o fatigue, incontinent of stool upon standing  Stairs            Wheelchair Mobility    Modified Rankin (Stroke Patients Only)       Balance Overall balance assessment: Needs assistance Sitting-balance support: Feet supported;No upper extremity supported Sitting balance-Leahy Scale: Good     Standing balance support: During functional activity;Bilateral upper extremity supported Standing balance-Leahy Scale: Fair                               Pertinent Vitals/Pain Pain Assessment: 0-10 Pain Score: 4  Pain Location: low back Pain Descriptors / Indicators: Aching Pain Intervention(s): Limited activity within patient's tolerance    Home Living Family/patient expects to be discharged to:: Private residence Living Arrangements: Alone Available Help at Discharge: Family(neice lives next door) Type of Home: House Home Access: Ramped entrance     Home Layout: One level Home Equipment: Bedside commode;Shower seat;Walker - 2 wheels;Wheelchair - manual      Prior Function Level of Independence: Needs assistance   Gait / Transfers Assistance Needed: household ambulator with RW  ADL's / Homemaking Assistance Needed: family assist with community ADLs        Hand Dominance        Extremity/Trunk Assessment   Upper Extremity Assessment Upper Extremity Assessment: Generalized weakness    Lower Extremity  Assessment Lower Extremity Assessment: Generalized weakness    Cervical / Trunk Assessment Cervical / Trunk Assessment: Kyphotic  Communication   Communication: No difficulties  Cognition Arousal/Alertness:  Awake/alert Behavior During Therapy: WFL for tasks assessed/performed Overall Cognitive Status: Within Functional Limits for tasks assessed                                        General Comments      Exercises     Assessment/Plan    PT Assessment Patient needs continued PT services  PT Problem List Decreased strength;Decreased activity tolerance;Decreased balance;Decreased mobility       PT Treatment Interventions Gait training;Functional mobility training;Therapeutic activities;Therapeutic exercise;Patient/family education    PT Goals (Current goals can be found in the Care Plan section)  Acute Rehab PT Goals Patient Stated Goal: return home with family to assist PT Goal Formulation: With patient Time For Goal Achievement: 03/10/18 Potential to Achieve Goals: Good    Frequency Min 3X/week   Barriers to discharge        Co-evaluation               AM-PAC PT "6 Clicks" Daily Activity  Outcome Measure Difficulty turning over in bed (including adjusting bedclothes, sheets and blankets)?: None Difficulty moving from lying on back to sitting on the side of the bed? : A Little Difficulty sitting down on and standing up from a chair with arms (e.g., wheelchair, bedside commode, etc,.)?: A Lot Help needed moving to and from a bed to chair (including a wheelchair)?: A Lot Help needed walking in hospital room?: A Lot Help needed climbing 3-5 steps with a railing? : A Lot 6 Click Score: 15    End of Session   Activity Tolerance: Patient tolerated treatment well;Patient limited by fatigue Patient left: in chair;with call bell/phone within reach Nurse Communication: Mobility status;Other (comment)(RN aware patient left up in chair) PT Visit Diagnosis: Unsteadiness on feet (R26.81);Other abnormalities of gait and mobility (R26.89);Muscle weakness (generalized) (M62.81)    Time: 2841-32440929-1005 PT Time Calculation (min) (ACUTE ONLY): 36 min   Charges:   PT  Evaluation $PT Eval Moderate Complexity: 1 Mod PT Treatments $Therapeutic Activity: 23-37 mins        12:30 PM, 02/24/18 Ocie BobJames Cabot Cromartie, MPT Physical Therapist [facility] [phone] office [phone] mobile phone

## 2018-02-24 NOTE — Plan of Care (Signed)
  Problem: Acute Rehab PT Goals(only PT should resolve) Goal: Pt Will Go Supine/Side To Sit Flowsheets (Taken 02/24/2018 1230) Pt will go Supine/Side to Sit: with supervision Goal: Patient Will Transfer Sit To/From Stand Flowsheets (Taken 02/24/2018 1230) Patient will transfer sit to/from stand: with min guard assist Goal: Pt Will Transfer Bed To Chair/Chair To Bed Flowsheets (Taken 02/24/2018 1230) Pt will Transfer Bed to Chair/Chair to Bed: min guard assist Goal: Pt Will Ambulate Flowsheets (Taken 02/24/2018 1230) Pt will Ambulate: with min guard assist; 50 feet; with rolling walker   12:30 PM, 02/24/18 Ocie BobJames Elijahjames Fuelling, MPT Physical Therapist with [facility] 336 [phone] office [phone] mobile phone

## 2018-02-24 NOTE — Progress Notes (Signed)
Patient up to void today.  Bladder scan shows 650 ml.  Dr. Mariea ClontsEmokpae notified via text page.

## 2018-02-24 NOTE — Progress Notes (Signed)
Patient had not voided during the night.  Patient bladder scanned and it showed >  900 cc urine.  Notified MD.  Received order for I&O cath.  Patient had 1000 cc amber urine returned.  Patient tolerated procedure well.

## 2018-02-24 NOTE — Care Management (Signed)
CM made second attempt to contact daughter. HIPPA compliant VM left.

## 2018-02-24 NOTE — Care Management (Signed)
CM has attempted to call daughter and son listed on chart to deliver MOON and discuss DC planning. PT recommends SNF, pt says she is going home with her daughter. Pt confused at times.

## 2018-02-25 DIAGNOSIS — W19XXXA Unspecified fall, initial encounter: Secondary | ICD-10-CM | POA: Diagnosis not present

## 2018-02-25 DIAGNOSIS — Y92009 Unspecified place in unspecified non-institutional (private) residence as the place of occurrence of the external cause: Secondary | ICD-10-CM | POA: Diagnosis not present

## 2018-02-25 DIAGNOSIS — R52 Pain, unspecified: Secondary | ICD-10-CM | POA: Diagnosis not present

## 2018-02-25 DIAGNOSIS — I10 Essential (primary) hypertension: Secondary | ICD-10-CM | POA: Diagnosis not present

## 2018-02-25 LAB — GLUCOSE, CAPILLARY
GLUCOSE-CAPILLARY: 142 mg/dL — AB (ref 70–99)
GLUCOSE-CAPILLARY: 122 mg/dL — AB (ref 70–99)
Glucose-Capillary: 153 mg/dL — ABNORMAL HIGH (ref 70–99)

## 2018-02-25 LAB — CBC
HCT: 22.7 % — ABNORMAL LOW (ref 36.0–46.0)
Hemoglobin: 7.7 g/dL — ABNORMAL LOW (ref 12.0–15.0)
MCH: 29.4 pg (ref 26.0–34.0)
MCHC: 33.9 g/dL (ref 30.0–36.0)
MCV: 86.6 fL (ref 78.0–100.0)
PLATELETS: 155 10*3/uL (ref 150–400)
RBC: 2.62 MIL/uL — ABNORMAL LOW (ref 3.87–5.11)
RDW: 14.7 % (ref 11.5–15.5)
WBC: 7 10*3/uL (ref 4.0–10.5)

## 2018-02-25 LAB — BASIC METABOLIC PANEL
ANION GAP: 8 (ref 5–15)
BUN: 9 mg/dL (ref 8–23)
CALCIUM: 8 mg/dL — AB (ref 8.9–10.3)
CO2: 25 mmol/L (ref 22–32)
CREATININE: 0.7 mg/dL (ref 0.44–1.00)
Chloride: 92 mmol/L — ABNORMAL LOW (ref 98–111)
GFR calc Af Amer: >60 mL/min (ref >60)
GLUCOSE: 138 mg/dL — AB (ref 70–99)
Potassium: 3.2 mmol/L — ABNORMAL LOW (ref 3.5–5.1)
Sodium: 125 mmol/L — ABNORMAL LOW (ref 135–145)

## 2018-02-25 MED ORDER — ACETAMINOPHEN 325 MG PO TABS: 650.0000 mg | ORAL_TABLET | Freq: Four times a day (QID) | ORAL | 0 refills | Status: DC | PRN

## 2018-02-25 MED ORDER — FUROSEMIDE 40 MG PO TABS: 40.0000 mg | ORAL_TABLET | Freq: Every day | ORAL | 11 refills | Status: DC

## 2018-02-25 MED ORDER — TAMSULOSIN HCL 0.4 MG PO CAPS: 0.4000 mg | ORAL_CAPSULE | Freq: Every day | ORAL | 1 refills | Status: AC

## 2018-02-25 NOTE — Progress Notes (Signed)
Bladder scan revealed less than in bladder. Patient is drinking water and states that she does not need to void. Daughter at bedside and was updated about patient maybe needing a to go home with a foley catheter. Patient and verbalizes understanding.

## 2018-02-25 NOTE — Progress Notes (Signed)
Patient resting in bed. Patient states that she does not feel like getting out of the bed at this moment. She states that she would like to "stay in bed and realx for a little while". Patient offers to get out of bed "after while". Patient offers no other complaints at this time. Will continue to monitor.

## 2018-02-25 NOTE — Discharge Instructions (Signed)
°  1)Generalized weakness/recurrent falls--- get up only with assistance and with a walker 2)You Need home physical therapy to help your endurance and gait strengthening  3) if you are unable to urinate by 7 AM on Wednesday, 02/26/2018 please go to your primary care office so that they can place a Foley catheter--- if that happens he will need to follow-up with a urologist for possible urinary retention and bladder problems 4) your blood count is low/you have anemia----repeat complete blood count/CBC with your primary care provider around 02/28/2018, call your primary care provider if any concerns about dark stools or blood in your stool 5) your sodium is low--- avoid excessive free water intake ,may drink other liquids, you need repeat BMP/kidney test with your primary care physician on 03/08/2018

## 2018-02-25 NOTE — Progress Notes (Signed)
61F foley catheter removed. Patient tolerated well. Purewick in place now.

## 2018-02-25 NOTE — Clinical Social Work Note (Signed)
Late Entry for 02/24/18 Clinical Social Work Assessment  Patient Details  Name: [name] [name] MRN: [id number] Date of Birth: [birth year]  Date of referral:  02/25/18               Reason for consult:  Facility Placement                Permission sought to share information with:    Permission granted to share information::     Name::        Agency::     Relationship::     Contact Information:  dtr. [name] [name]  Housing/Transportation Living arrangements for the past 2 months:  Single Family Home Source of Information:  Patient, Adult Children Patient Interpreter Needed:  None Criminal Activity/Legal Involvement Pertinent to Current Situation/Hospitalization:  No - Comment as needed Significant Relationships:  Adult Children Lives with:  Self Do you feel safe going back to the place where you live?  Yes Need for family participation in patient care:  Yes (Comment)  Care giving concerns:  None identified at baseline.   Social Worker assessment / plan:  Patient states that she is going to go home with her daughter [name] [name]. She stated that she spoke with [name] Regalarol this morning in regards to the discharge plan.  She declined SNF.   Employment status:  Retired Database administratornsurance information:  Managed Medicare PT Recommendations:  Radio producer24 Hour Supervision, Skilled Nursing Facility Information / Referral to community resources:  Skilled Nursing Facility  Patient/Family's Response to care:  Patient declined SNF. Patient has made a plan with daughter, [name] [name], to go home with her at discharge.   Patient/Family's Understanding of and Emotional Response to Diagnosis, Current Treatment, and Prognosis:  Patient understands her diagnosis, treatment and prognosis.   Emotional Assessment Appearance:  Appears stated age Attitude/Demeanor/Rapport:    Affect (typically observed):  Accepting, Calm Orientation:  Oriented to Self, Oriented to Place, Oriented to Situation Alcohol / Substance use:  Not Applicable Psych  involvement (Current and /or in the community):  No (Comment)  Discharge Needs  Concerns to be addressed:  Discharge Planning Concerns Readmission within the last 30 days:  Yes Current discharge risk:  None Barriers to Discharge:  No Barriers Identified   Annice NeedySettle, Tate Jerkins D, LCSW 02/25/2018, 9:12 AM

## 2018-02-25 NOTE — Clinical Social Work Note (Signed)
Patient's daughter, Lyla SonCardilia, confirmed the discharge plan of patient going to her daughter, Carol's house at discharge. She stated that patient went to Carol's house last time she discharged from the hospital.   LCSW signing off.     Delaynie Stetzer, Juleen ChinaHeather D, LCSW

## 2018-02-25 NOTE — Care Management Note (Signed)
Case Management Note  Patient Details  Name: [name] [name] MRN: [id number] Date of Birth: 09/08/1932  Subjective/Objective:    Observation for falls. Pt lives alone and is unsafe, she has dementia. She has 2 daughters and plans to DC home with Okey RegalCarol to [address], [place], [place] [address], phone number [phone]. Pt has had HH in the past with [facility]. Daughter would like to use them again, they are aware HH has 48 hrs to make first visit. Okey RegalCarol concerned pt will try and go back home after a few weeks but Okey RegalCarol plans for this to be permanent.                 Action/Plan: DC home today with HH nursing, PT, OT and SW. Pt has no HH or DME needs. [facility]   Expected Discharge Date:     02/25/18             Expected Discharge Plan:  Home w Home Health Services  In-House Referral:  Clinical Social Work  Discharge planning Services  CM Consult  Post Acute Care Choice:  Home Health Choice offered to:  Adult Children  HH Arranged:  RN, PT, OT, Social Work [facility]  Status of Service:  Completed, signed off  Malcolm MetroChildress, Ari Engelbrecht Demske, RN 02/25/2018, 11:23 AM

## 2018-02-25 NOTE — Discharge Summary (Signed)
[name] [name], is a 84 y.o. female  DOB 02/19/1933  MRN [id number].  Admission date:  02/23/2018  Admitting Physician  Adriana Lina Mariea ClontsEmokpae, MD  Discharge Date:  02/25/2018   Primary MD  Junie SpencerHawks, Christy A, FNP  Recommendations for primary care physician for things to follow:   1)Generalized weakness/recurrent falls--- get up only with assistance and with a walker 2)You Need home physical therapy to help your endurance and gait strengthening  3) if you are unable to urinate by 7 AM on Wednesday, 02/26/2018 please go to your primary care office so that they can place a Foley catheter--- if that happens he will need to follow-up with a urologist for possible urinary retention and bladder problems 4) your blood count is low/you have anemia----repeat complete blood count/CBC with your primary care provider around 02/28/2018, call your primary care provider if any concerns about dark stools or blood in your stool  Admission Diagnosis  Fall [W19.XXXA] Fall, initial encounter [id number][W19.XXXA]   Discharge Diagnosis  Fall [W19.XXXA] Fall, initial encounter [id number][W19.XXXA]    Principal Problem:   Fall at home, initial encounter Active Problems:   Hypothyroid   Vitamin B 12 deficiency   GAD (generalized anxiety disorder)   Hypertension associated with diabetes (HCC)   Chronic diastolic heart failure (HCC)   PPM-Medtronic   Elevated troponin   Sleep apnea   Fall      Past Medical History:  Diagnosis Date  . ANXIETY   . Arteriosclerotic cardiovascular disease (ASCVD)    Cath in 9/99-70% mid LAD with diffuse distal disease, 70% T1, 60% mid circumflex, 50% mid RCA, normal ejection fraction; negative stress nuclear in 07/2008  . Carpal tunnel syndrome   . Cerebrovascular disease    carotid bruits; no focal disease in 1999, 2002 and 2006  . Closed left fibular fracture 11/30/11  . COPD   . Diabetes mellitus    Insulin  treatment  . Diastolic CHF, chronic (HCC)    Normal EF  . Gastroesophageal reflux disease    With hiatal hernia; irritable bowel syndrome; H. pylori-treated; Colonoscopy 2008: non-specific colitis, IH, Diverticulosis, Rectal ulcer secondary to ASA  . HOH (hard of hearing)   . Hyperlipidemia   . Hypertension   . Hypothyroidism   . OSTEOARTHRITIS, KNEES, BILATERAL    Bilateral TKA  . Pacemaker   . Peripheral neuropathy   . Sinoatrial node dysfunction (HCC) 2003   Medtronic dual-chamber device  . Sleep apnea    pt could not tolerate  . VITAMIN B12 DEFICIENCY     Past Surgical History:  Procedure Laterality Date  . ABDOMINAL HYSTERECTOMY    . BLADDER REPAIR    . CARDIAC PACEMAKER PLACEMENT  2003   Medtronic, dual-chamber  . CARPAL TUNNEL RELEASE    . CATARACT EXTRACTION W/PHACO Left 08/09/2017   Procedure: CATARACT EXTRACTION PHACO AND INTRAOCULAR LENS PLACEMENT (IOC);  Surgeon: Fabio PierceWrzosek, James, MD;  Location: [facility];  Service: Ophthalmology;  Laterality: Left;  CDE: 6.26  . CHOLECYSTECTOMY    . COLONOSCOPY  2008  . HEMORRHOID SURGERY    . NASAL FRACTURE SURGERY    . THYROIDECTOMY  1980   Goiter  . TOTAL KNEE ARTHROPLASTY     Bilateral       HPI  from the history and physical done on the day of admission:     [name] [name]  is a 84 y.o. female past medical history relevant for hypothyroidism, CAD, status post pacemaker placement for cardiac arrhythmia, diabetes and hypertension who presents after being found down at home she apparently fell around 7 PM last evening she was found by family about 15 hours later........ patient believes she had a mechanical fall and just could not get up, no tongue biting or any concerns about seizures  In the ED imaging studies without acute fractures or other acute findings,  Patient denies chest pains palpitations or dizziness  At this time patient remains very weak unable to perform mobility related activities of daily living, patient will  need physical therapy evaluation, she had no oral intake for over 18 hours now, patient will need gentle hydration with IV fluids,   Hospital Course:   Brief Summary 84 year old female with past medical history relevant for hypothyroidism, CAD, status post pacemaker placement for cardiac dysrhythmia as well as hypertension and diabetic admitted on 02/23/2018 after being found at home on the floor laying there for at least 15 hours after falling.   Plan:- 1)Fall at Home/Generalized Weakness/Debility- physical therapy evaluation appreciated, no acute fractures or serious injuries identified, pt and family declines SNF rehab prior to returning home.  Patient remains very weak unable to perform mobility related activities of daily living, multiple abrasions/ecchymosis/bruises at different stages of healing from head to toe suggesting multiple falls at home  2)H/oarrhythmia/sick sinus syndrome--status post Medtronic dual-chamber pacemaker placement,pacemaker interrogated by Medtronic tech at this time no significant abnormalities noted recently  3)DM2-excellent control, recent A1c 6.2, continue metformin 1 g twice daily with meals,\  4)HTN- stable, c/n metoprolol XL 25 mg daily,,lisinopril 10 mg daily,  5)History of CAD--no ACS type symptoms at this time,troponin is flat and close to patient's usual baseline,continue aspirin,pravastatin and metoprolol  6)HFpEF---patient with history of chronic diastolic dysfunction CHF, no evidence of acute exacerbation at this time, elevated BNP noted, but clinically and auscultativelyno evidence of significant CHF exacerbation, chest x-ray also without significant pulmonary edema or pulmonaryvenous congestion  7) Candida intertrigo----improving, c/n  Mycolog ointment  8) hyponatremia/Anemia--- ??  Hemodilution, hemoglobin down to 7.7, restrict free water, repeat CBC and BMP with PCP around 02/28/2018 , patient is advised to call if any bleeding  concerns   9)Dispo=--patient declines skilled nursing facility placement/rehab , she wants to go home with her daughter up from, daughter promises to provide 24/7 care and supervision  10) urinary retention--Flomax 0.4 mg every afternoon, follow-up with PCP if unable to void post discharge, may need referral to urology, patient and daughter are reluctant to have a Foley catheter placed at this time, they prefer to follow-up if needed with PCP if unable to void  Code Status : full   Disposition Plan  : SNF Rehab  Consults  :  PT/SW  DVT Prophylaxis  :   Heparin   Discharge Condition: stable  Follow UP- PCP on 02/28/18  Diet and Activity recommendation:  As advised  Discharge Instructions    Discharge Instructions    Call MD for:  difficulty breathing, headache or visual disturbances   Complete by:  As directed    Call MD for:  persistant dizziness or light-headedness   Complete by:  As directed    Call MD for:  persistant nausea and vomiting   Complete by:  As directed    Call MD for:  severe uncontrolled pain   Complete by:  As directed    Call MD for:  temperature >100.4   Complete by:  As directed    Diet - low sodium heart healthy   Complete by:  As directed    Discharge instructions   Complete by:  As directed    1)Generalized weakness/recurrent falls--- get up only with assistance and with a walker 2)You Need home physical therapy to help your endurance and gait strengthening  3) if you are unable to urinate by 7 AM on Wednesday, 02/26/2018 please go to your primary care office so that they can place a Foley catheter--- if that happens he will need to follow-up with a urologist for possible urinary retention and bladder problems 4) your blood count is low/you have anemia----repeat complete blood count/CBC with your primary care provider around 02/28/2018, call your primary care provider if any concerns about dark stools or blood in your stool 5) your sodium is low---  avoid excessive free water intake ,may drink other liquids, you need repeat BMP/kidney test with your primary care physician on 03/08/2018   Increase activity slowly   Complete by:  As directed    Get up only with assistance and with a walker you are high risk for falling        Discharge Medications     Allergies as of 02/25/2018      Reactions   Penicillins    Has patient had a PCN reaction causing immediate rash, facial/tongue/throat swelling, SOB or lightheadedness with hypotension: Unknown Has patient had a PCN reaction causing severe rash involving mucus membranes or skin necrosis: Unknown Has patient had a PCN reaction that required hospitalization: Unknown Has patient had a PCN reaction occurring within the last 10 years: No If all of the above answers are "NO", then may proceed with Cephalosporin use.      Medication List    STOP taking these medications   doxycycline 100 MG tablet Commonly known as:  VIBRA-TABS     TAKE these medications   acetaminophen 325 MG tablet Commonly known as:  TYLENOL Take 2 tablets (650 mg total) by mouth every 6 (six) hours as needed for mild pain (or Fever >/= 101).   ALPRAZolam 0.5 MG tablet Commonly known as:  XANAX Take 1 tablet (0.5 mg total) by mouth at bedtime as needed for anxiety.   aspirin EC 81 MG tablet Take 81 mg by mouth daily. Reported on 12/14/2015   budesonide-formoterol 80-4.5 MCG/ACT inhaler Commonly known as:  SYMBICORT 2 PUFFS 2 TIMES A DAY   furosemide 40 MG tablet Commonly known as:  LASIX Take 1 tablet (40 mg total) by mouth daily. What changed:  when to take this   gabapentin 300 MG capsule Commonly known as:  NEURONTIN TAKE (1) CAPSULE THREE TIMES DAILY.   glucose blood test strip Use BID   levothyroxine 200 MCG tablet Commonly known as:  SYNTHROID, LEVOTHROID TAKE (1) TABLET DAILY BE- FORE BREAKFAST. What changed:  Another medication with the same name was removed. Continue taking this medication,  and follow the directions you see here.   lisinopril 10 MG tablet Commonly known as:  PRINIVIL,ZESTRIL TAKE 1 TABLET DAILY   metFORMIN 1000 MG tablet Commonly known as:  GLUCOPHAGE TAKE (1) TABLET TWICE  A DAY WITH MEALS (BREAKFAST AND SUPPER) FOR DIAB ETES   metoprolol succinate 25 MG 24 hr tablet Commonly known as:  TOPROL-XL TAKE (1) TABLET DAILY WITH FOOD.   nitroGLYCERIN 0.4 MG/SPRAY spray Commonly known as:  NITROLINGUAL Place 1 spray under the tongue every 5 (five) minutes as needed.   nystatin-triamcinolone cream Commonly known as:  MYCOLOG II APPLY TO AFFECTED AREA 3 TIMES A DAY   pravastatin 80 MG tablet Commonly known as:  PRAVACHOL TAKE 1 TABLET ONCE DAILY FOR CHOLESTEROL   PROAIR HFA 108 (90 Base) MCG/ACT inhaler Generic drug:  albuterol 2 PUFFS EVERY 6 HOURS AS NEEDED FOR WHEEZING   sertraline 50 MG tablet Commonly known as:  ZOLOFT TAKE 1 TABLET DAILY What changed:  Another medication with the same name was removed. Continue taking this medication, and follow the directions you see here.   tamsulosin 0.4 MG Caps capsule Commonly known as:  FLOMAX Take 1 capsule (0.4 mg total) by mouth daily after supper.       Major procedures and Radiology Reports - PLEASE review detailed and final reports for all details, in brief -   Dg Chest 1 View  Result Date: 02/23/2018 CLINICAL DATA:  Fall EXAM: CHEST  1 VIEW COMPARISON:  10/14/2017 FINDINGS: Small right pleural effusion. Associated right lower lobe opacity, likely atelectasis. Cardiomegaly. No frank interstitial edema. Left subclavian pacemaker. IMPRESSION: Small right pleural effusion. Associated right lower lobe opacity, likely atelectasis. Electronically Signed   By: Charline Bills M.D.   On: 02/23/2018 13:52   Dg Lumbar Spine Complete  Result Date: 02/23/2018 CLINICAL DATA:  Fall EXAM: LUMBAR SPINE - COMPLETE 4+ VIEW COMPARISON:  None. FINDINGS: Five lumbar-type vertebral bodies. Mild straightening of  the lumbar spine. No evidence of fracture or dislocation. Vertebral body heights are maintained. Mild to moderate multilevel degenerative changes. Visualized bony pelvis appears intact. IMPRESSION: No fracture or dislocation is seen. Mild to moderate degenerative changes. Electronically Signed   By: Charline Bills M.D.   On: 02/23/2018 13:50   Ct Head Wo Contrast  Result Date: 02/23/2018 CLINICAL DATA:  Larey Seat.  Hit head. EXAM: CT HEAD WITHOUT CONTRAST CT MAXILLOFACIAL WITHOUT CONTRAST CT CERVICAL SPINE WITHOUT CONTRAST TECHNIQUE: Multidetector CT imaging of the head, cervical spine, and maxillofacial structures were performed using the standard protocol without intravenous contrast. Multiplanar CT image reconstructions of the cervical spine and maxillofacial structures were also generated. COMPARISON:  07/02/2017 FINDINGS: CT HEAD FINDINGS Brain: Stable age related cerebral atrophy, ventriculomegaly and periventricular white matter disease. No extra-axial fluid collections are identified. No CT findings for acute hemispheric infarction or intracranial hemorrhage. No mass lesions. The brainstem and cerebellum are normal. Vascular: No hyperdense vessel or unexpected calcification. Skull: No acute skull fracture. Other: No scalp hematoma or obvious laceration. CT MAXILLOFACIAL FINDINGS Osseous: No acute facial bone fractures. The mandibular condyles are normally located. No mandible fracture. Orbits: No orbital fracture.  The globes are intact. Sinuses: The paranasal sinuses and mastoid air cells are clear. Concha bullosa of the left middle turbinates noted and there is deviation of the bony nasal septum rightward. Soft tissues: No significant soft tissue swelling or hematoma. CT CERVICAL SPINE FINDINGS Alignment: Exam degraded by motion artifact. Grossly normal alignment. Skull base and vertebrae: No obvious fracture. Soft tissues and spinal canal: No abnormal prevertebral soft tissue swelling or canal hematoma.  Disc levels: The spinal canal is generous. No spinal stenosis. Mild multilevel foraminal narrowing due to uncinate spurring. Upper chest: The lung apices are grossly  clear. Other: Permanent left-sided pacemaker noted. Advanced carotid artery calcifications. IMPRESSION: 1. No acute intracranial findings or skull fracture. 2. No acute facial bone fractures. 3. Cervical spine images are degraded by motion artifact but grossly normal alignment and no obvious acute fracture. Electronically Signed   By: Rudie Meyer M.D.   On: 02/23/2018 13:44   Ct Cervical Spine Wo Contrast  Result Date: 02/23/2018 CLINICAL DATA:  Larey Seat.  Hit head. EXAM: CT HEAD WITHOUT CONTRAST CT MAXILLOFACIAL WITHOUT CONTRAST CT CERVICAL SPINE WITHOUT CONTRAST TECHNIQUE: Multidetector CT imaging of the head, cervical spine, and maxillofacial structures were performed using the standard protocol without intravenous contrast. Multiplanar CT image reconstructions of the cervical spine and maxillofacial structures were also generated. COMPARISON:  07/02/2017 FINDINGS: CT HEAD FINDINGS Brain: Stable age related cerebral atrophy, ventriculomegaly and periventricular white matter disease. No extra-axial fluid collections are identified. No CT findings for acute hemispheric infarction or intracranial hemorrhage. No mass lesions. The brainstem and cerebellum are normal. Vascular: No hyperdense vessel or unexpected calcification. Skull: No acute skull fracture. Other: No scalp hematoma or obvious laceration. CT MAXILLOFACIAL FINDINGS Osseous: No acute facial bone fractures. The mandibular condyles are normally located. No mandible fracture. Orbits: No orbital fracture.  The globes are intact. Sinuses: The paranasal sinuses and mastoid air cells are clear. Concha bullosa of the left middle turbinates noted and there is deviation of the bony nasal septum rightward. Soft tissues: No significant soft tissue swelling or hematoma. CT CERVICAL SPINE FINDINGS  Alignment: Exam degraded by motion artifact. Grossly normal alignment. Skull base and vertebrae: No obvious fracture. Soft tissues and spinal canal: No abnormal prevertebral soft tissue swelling or canal hematoma. Disc levels: The spinal canal is generous. No spinal stenosis. Mild multilevel foraminal narrowing due to uncinate spurring. Upper chest: The lung apices are grossly clear. Other: Permanent left-sided pacemaker noted. Advanced carotid artery calcifications. IMPRESSION: 1. No acute intracranial findings or skull fracture. 2. No acute facial bone fractures. 3. Cervical spine images are degraded by motion artifact but grossly normal alignment and no obvious acute fracture. Electronically Signed   By: Rudie Meyer M.D.   On: 02/23/2018 13:44   Dg Hip Unilat W Or Wo Pelvis 2-3 Views Right  Result Date: 02/23/2018 CLINICAL DATA:  Fall EXAM: DG HIP (WITH OR WITHOUT PELVIS) 2-3V RIGHT COMPARISON:  None. FINDINGS: No fracture or dislocation is seen. Bilateral hip joint spaces are preserved. Visualized bony pelvis appears intact. Degenerative changes of the lower lumbar spine. IMPRESSION: Negative. Electronically Signed   By: Charline Bills M.D.   On: 02/23/2018 13:51   Ct Maxillofacial Wo Contrast  Result Date: 02/23/2018 CLINICAL DATA:  Larey Seat.  Hit head. EXAM: CT HEAD WITHOUT CONTRAST CT MAXILLOFACIAL WITHOUT CONTRAST CT CERVICAL SPINE WITHOUT CONTRAST TECHNIQUE: Multidetector CT imaging of the head, cervical spine, and maxillofacial structures were performed using the standard protocol without intravenous contrast. Multiplanar CT image reconstructions of the cervical spine and maxillofacial structures were also generated. COMPARISON:  07/02/2017 FINDINGS: CT HEAD FINDINGS Brain: Stable age related cerebral atrophy, ventriculomegaly and periventricular white matter disease. No extra-axial fluid collections are identified. No CT findings for acute hemispheric infarction or intracranial hemorrhage. No  mass lesions. The brainstem and cerebellum are normal. Vascular: No hyperdense vessel or unexpected calcification. Skull: No acute skull fracture. Other: No scalp hematoma or obvious laceration. CT MAXILLOFACIAL FINDINGS Osseous: No acute facial bone fractures. The mandibular condyles are normally located. No mandible fracture. Orbits: No orbital fracture.  The globes are intact. Sinuses: The  paranasal sinuses and mastoid air cells are clear. Concha bullosa of the left middle turbinates noted and there is deviation of the bony nasal septum rightward. Soft tissues: No significant soft tissue swelling or hematoma. CT CERVICAL SPINE FINDINGS Alignment: Exam degraded by motion artifact. Grossly normal alignment. Skull base and vertebrae: No obvious fracture. Soft tissues and spinal canal: No abnormal prevertebral soft tissue swelling or canal hematoma. Disc levels: The spinal canal is generous. No spinal stenosis. Mild multilevel foraminal narrowing due to uncinate spurring. Upper chest: The lung apices are grossly clear. Other: Permanent left-sided pacemaker noted. Advanced carotid artery calcifications. IMPRESSION: 1. No acute intracranial findings or skull fracture. 2. No acute facial bone fractures. 3. Cervical spine images are degraded by motion artifact but grossly normal alignment and no obvious acute fracture. Electronically Signed   By: Rudie Meyer M.D.   On: 02/23/2018 13:44    Micro Results    Recent Results (from the past 240 hour(s))  MRSA PCR Screening     Status: None   Collection Time: 02/24/18  6:44 AM  Result Value Ref Range Status   MRSA by PCR NEGATIVE NEGATIVE Final    Comment:        The GeneXpert MRSA Assay (FDA approved for NASAL specimens only), is one component of a comprehensive MRSA colonization surveillance program. It is not intended to diagnose MRSA infection nor to guide or monitor treatment for MRSA infections. Performed at [facility], [address],  [place], [place] [address]        Today   Subjective    Jacara Cerutti today has no new concerns, eating and drinking okay, complains of generalized weakness and fatigue          Patient has been seen and examined prior to discharge   Objective   Blood pressure (!) 143/62, pulse 62, temperature 97.7 F (36.5 C), temperature source Oral, resp. rate 20, height 5\' 10"  (1.778 m), weight 83.9 kg, SpO2 98 %.   Intake/Output Summary (Last 24 hours) at 02/25/2018 1725 Last data filed at 02/25/2018 1150 Gross per 24 hour  Intake 552.53 ml  Output 1900 ml  Net -1347.47 ml    Exam   Gen:- Awake Alert,  In no apparent distress  HEENT:- [place].AT, No sclera icterus Neck-Supple Neck,No JVD,.  Lungs-  CTAB , good air movement CV- S1, S2 normal Abd-  +ve B.Sounds, Abd Soft, No tenderness,    Extremity/Skin:-Good pulses, multiple abrasions/ecchymosis/bruises at different stages of healing from head to toe suggesting multiple falls at home Psych-affect is appropriate, oriented x3 Neuro-generalized weakness without new focal deficits, no tremors Skin- candida intertrigo in folds/groin/perineum  Data Review   CBC w Diff:  Lab Results  Component Value Date   WBC 7.0 02/25/2018   HGB 7.7 (L) 02/25/2018   HGB 11.4 10/14/2017   HCT 22.7 (L) 02/25/2018   HCT 37.2 10/14/2017   PLT 155 02/25/2018   PLT 293 10/14/2017   LYMPHOPCT 11 02/23/2018   MONOPCT 7 02/23/2018   EOSPCT 0 02/23/2018   BASOPCT 0 02/23/2018    CMP:  Lab Results  Component Value Date   NA 125 (L) 02/25/2018   NA 138 11/13/2017   K 3.2 (L) 02/25/2018   CL 92 (L) 02/25/2018   CO2 25 02/25/2018   BUN 9 02/25/2018   BUN 19 11/13/2017   CREATININE 0.70 02/25/2018   CREATININE 0.88 12/04/2012   PROT 5.9 (L) 02/23/2018   PROT 6.7 11/13/2017   ALBUMIN 3.4 (  L) 02/23/2018   ALBUMIN 4.1 11/13/2017   BILITOT 1.2 02/23/2018   BILITOT 0.3 11/13/2017   ALKPHOS 56 02/23/2018   AST 41 02/23/2018   ALT 36 02/23/2018   .   Total Discharge time is about 33 minutes  Shon Hale M.D on 02/25/2018 at 5:25 PM  Pager---[phone]  Go to [email or web address] - password [email or web address] for contact info  [facility] - Office  [phone]

## 2018-02-25 NOTE — Care Management Obs Status (Signed)
MEDICARE OBSERVATION STATUS NOTIFICATION   Patient Details  Name: [name] [name] MRN: [id number] Date of Birth: [birth year]   Medicare Observation Status Notification Given:  No(unable to contact family to deliver form - pt not competent to sign )    Malcolm MetroChildress, Eira Alpert Demske, RN 02/25/2018, 9:15 AM

## 2018-02-26 ENCOUNTER — Telehealth: Payer: Self-pay | Admitting: Family

## 2018-02-26 NOTE — Telephone Encounter (Signed)
Daughter states that patient has urinated twice today and has apt with [facility] tomorrow to fill out nursing home.

## 2018-02-26 NOTE — Telephone Encounter (Signed)
Make an appointment for her this afternoon or evening, if she is still not made urine by then we will do a catheterization

## 2018-02-26 NOTE — Telephone Encounter (Signed)
Daughter Lynnea MaizesCardila is calling about her mom.  Mrs. Alberty was admitted to the hospital from 9/15 through 9/17, after being found in the floor at her home after apparently falling.  During her hospital stay she was unable to urinate other than once and has not urinated since she came home.  During her discharge she was told if she had not urinated by 7 am this morning she needed to come in to be seen to be catherized and her PCP should refer her to urologist for urinary retention.  Daughter is wanting to know if she should bring her in.  Please advise.

## 2018-02-27 ENCOUNTER — Inpatient Hospital Stay (HOSPITAL_COMMUNITY)
Admission: EM | Admit: 2018-02-27 | Discharge: 2018-03-06 | DRG: 292 | Disposition: A | Discharge status: Skilled Nursing Facility | Payer: Medicare HMO | Attending: Internal Medicine | Admitting: Internal Medicine

## 2018-02-27 ENCOUNTER — Ambulatory Visit (INDEPENDENT_AMBULATORY_CARE_PROVIDER_SITE_OTHER): Payer: Medicare HMO | Admitting: Family

## 2018-02-27 ENCOUNTER — Encounter: Payer: Self-pay | Admitting: Family

## 2018-02-27 ENCOUNTER — Emergency Department (HOSPITAL_COMMUNITY): Payer: Medicare HMO

## 2018-02-27 ENCOUNTER — Encounter (HOSPITAL_COMMUNITY): Payer: Self-pay | Admitting: *Deleted

## 2018-02-27 ENCOUNTER — Other Ambulatory Visit: Payer: Self-pay

## 2018-02-27 VITALS — BP 169/81 | HR 64 | Temp 98.1°F

## 2018-02-27 DIAGNOSIS — E538 Deficiency of other specified B group vitamins: Secondary | ICD-10-CM | POA: Diagnosis present

## 2018-02-27 DIAGNOSIS — I5042 Chronic combined systolic (congestive) and diastolic (congestive) heart failure: Secondary | ICD-10-CM | POA: Diagnosis not present

## 2018-02-27 DIAGNOSIS — I152 Hypertension secondary to endocrine disorders: Secondary | ICD-10-CM | POA: Diagnosis present

## 2018-02-27 DIAGNOSIS — Z23 Encounter for immunization: Secondary | ICD-10-CM

## 2018-02-27 DIAGNOSIS — Z90710 Acquired absence of both cervix and uterus: Secondary | ICD-10-CM

## 2018-02-27 DIAGNOSIS — I509 Heart failure, unspecified: Secondary | ICD-10-CM

## 2018-02-27 DIAGNOSIS — Z961 Presence of intraocular lens: Secondary | ICD-10-CM | POA: Diagnosis present

## 2018-02-27 DIAGNOSIS — I248 Other forms of acute ischemic heart disease: Secondary | ICD-10-CM | POA: Diagnosis not present

## 2018-02-27 DIAGNOSIS — K589 Irritable bowel syndrome without diarrhea: Secondary | ICD-10-CM | POA: Diagnosis present

## 2018-02-27 DIAGNOSIS — F411 Generalized anxiety disorder: Secondary | ICD-10-CM

## 2018-02-27 DIAGNOSIS — K449 Diaphragmatic hernia without obstruction or gangrene: Secondary | ICD-10-CM | POA: Diagnosis present

## 2018-02-27 DIAGNOSIS — E1143 Type 2 diabetes mellitus with diabetic autonomic (poly)neuropathy: Secondary | ICD-10-CM | POA: Diagnosis not present

## 2018-02-27 DIAGNOSIS — Z825 Family history of asthma and other chronic lower respiratory diseases: Secondary | ICD-10-CM

## 2018-02-27 DIAGNOSIS — J449 Chronic obstructive pulmonary disease, unspecified: Secondary | ICD-10-CM | POA: Diagnosis present

## 2018-02-27 DIAGNOSIS — Z7984 Long term (current) use of oral hypoglycemic drugs: Secondary | ICD-10-CM

## 2018-02-27 DIAGNOSIS — N3000 Acute cystitis without hematuria: Secondary | ICD-10-CM | POA: Diagnosis not present

## 2018-02-27 DIAGNOSIS — Z950 Presence of cardiac pacemaker: Secondary | ICD-10-CM

## 2018-02-27 DIAGNOSIS — G4700 Insomnia, unspecified: Secondary | ICD-10-CM | POA: Diagnosis not present

## 2018-02-27 DIAGNOSIS — Z7982 Long term (current) use of aspirin: Secondary | ICD-10-CM

## 2018-02-27 DIAGNOSIS — R531 Weakness: Secondary | ICD-10-CM

## 2018-02-27 DIAGNOSIS — N390 Urinary tract infection, site not specified: Secondary | ICD-10-CM | POA: Diagnosis not present

## 2018-02-27 DIAGNOSIS — G4730 Sleep apnea, unspecified: Secondary | ICD-10-CM | POA: Diagnosis present

## 2018-02-27 DIAGNOSIS — R748 Abnormal levels of other serum enzymes: Secondary | ICD-10-CM

## 2018-02-27 DIAGNOSIS — Z743 Need for continuous supervision: Secondary | ICD-10-CM | POA: Diagnosis not present

## 2018-02-27 DIAGNOSIS — Z833 Family history of diabetes mellitus: Secondary | ICD-10-CM | POA: Diagnosis not present

## 2018-02-27 DIAGNOSIS — R0602 Shortness of breath: Secondary | ICD-10-CM

## 2018-02-27 DIAGNOSIS — E1142 Type 2 diabetes mellitus with diabetic polyneuropathy: Secondary | ICD-10-CM | POA: Diagnosis present

## 2018-02-27 DIAGNOSIS — I10 Essential (primary) hypertension: Secondary | ICD-10-CM | POA: Diagnosis not present

## 2018-02-27 DIAGNOSIS — R296 Repeated falls: Secondary | ICD-10-CM | POA: Diagnosis present

## 2018-02-27 DIAGNOSIS — I5033 Acute on chronic diastolic (congestive) heart failure: Secondary | ICD-10-CM | POA: Diagnosis not present

## 2018-02-27 DIAGNOSIS — B9620 Unspecified Escherichia coli [E. coli] as the cause of diseases classified elsewhere: Secondary | ICD-10-CM | POA: Diagnosis present

## 2018-02-27 DIAGNOSIS — Z7189 Other specified counseling: Secondary | ICD-10-CM | POA: Diagnosis not present

## 2018-02-27 DIAGNOSIS — E785 Hyperlipidemia, unspecified: Secondary | ICD-10-CM | POA: Diagnosis present

## 2018-02-27 DIAGNOSIS — Z880 Allergy status to penicillin: Secondary | ICD-10-CM

## 2018-02-27 DIAGNOSIS — R778 Other specified abnormalities of plasma proteins: Secondary | ICD-10-CM | POA: Diagnosis present

## 2018-02-27 DIAGNOSIS — Z515 Encounter for palliative care: Secondary | ICD-10-CM

## 2018-02-27 DIAGNOSIS — R5381 Other malaise: Secondary | ICD-10-CM | POA: Diagnosis not present

## 2018-02-27 DIAGNOSIS — E1169 Type 2 diabetes mellitus with other specified complication: Secondary | ICD-10-CM | POA: Diagnosis present

## 2018-02-27 DIAGNOSIS — Z9842 Cataract extraction status, left eye: Secondary | ICD-10-CM

## 2018-02-27 DIAGNOSIS — R69 Illness, unspecified: Secondary | ICD-10-CM | POA: Diagnosis not present

## 2018-02-27 DIAGNOSIS — Z9049 Acquired absence of other specified parts of digestive tract: Secondary | ICD-10-CM

## 2018-02-27 DIAGNOSIS — Z96653 Presence of artificial knee joint, bilateral: Secondary | ICD-10-CM | POA: Diagnosis present

## 2018-02-27 DIAGNOSIS — M6281 Muscle weakness (generalized): Secondary | ICD-10-CM | POA: Diagnosis not present

## 2018-02-27 DIAGNOSIS — I110 Hypertensive heart disease with heart failure: Principal | ICD-10-CM | POA: Diagnosis present

## 2018-02-27 DIAGNOSIS — R7989 Other specified abnormal findings of blood chemistry: Secondary | ICD-10-CM

## 2018-02-27 DIAGNOSIS — I495 Sick sinus syndrome: Secondary | ICD-10-CM | POA: Diagnosis present

## 2018-02-27 DIAGNOSIS — Z8673 Personal history of transient ischemic attack (TIA), and cerebral infarction without residual deficits: Secondary | ICD-10-CM

## 2018-02-27 DIAGNOSIS — H9190 Unspecified hearing loss, unspecified ear: Secondary | ICD-10-CM | POA: Diagnosis present

## 2018-02-27 DIAGNOSIS — E039 Hypothyroidism, unspecified: Secondary | ICD-10-CM | POA: Diagnosis present

## 2018-02-27 DIAGNOSIS — I2510 Atherosclerotic heart disease of native coronary artery without angina pectoris: Secondary | ICD-10-CM | POA: Diagnosis present

## 2018-02-27 DIAGNOSIS — Z8249 Family history of ischemic heart disease and other diseases of the circulatory system: Secondary | ICD-10-CM

## 2018-02-27 DIAGNOSIS — B888 Other specified infestations: Secondary | ICD-10-CM | POA: Diagnosis present

## 2018-02-27 DIAGNOSIS — E871 Hypo-osmolality and hyponatremia: Secondary | ICD-10-CM | POA: Diagnosis not present

## 2018-02-27 DIAGNOSIS — I679 Cerebrovascular disease, unspecified: Secondary | ICD-10-CM | POA: Diagnosis present

## 2018-02-27 DIAGNOSIS — B372 Candidiasis of skin and nail: Secondary | ICD-10-CM | POA: Diagnosis present

## 2018-02-27 DIAGNOSIS — I5043 Acute on chronic combined systolic (congestive) and diastolic (congestive) heart failure: Secondary | ICD-10-CM | POA: Diagnosis present

## 2018-02-27 DIAGNOSIS — K219 Gastro-esophageal reflux disease without esophagitis: Secondary | ICD-10-CM | POA: Diagnosis present

## 2018-02-27 DIAGNOSIS — K5790 Diverticulosis of intestine, part unspecified, without perforation or abscess without bleeding: Secondary | ICD-10-CM | POA: Diagnosis present

## 2018-02-27 DIAGNOSIS — J90 Pleural effusion, not elsewhere classified: Secondary | ICD-10-CM | POA: Diagnosis not present

## 2018-02-27 DIAGNOSIS — Z79890 Hormone replacement therapy (postmenopausal): Secondary | ICD-10-CM

## 2018-02-27 DIAGNOSIS — Z9189 Other specified personal risk factors, not elsewhere classified: Secondary | ICD-10-CM | POA: Diagnosis not present

## 2018-02-27 LAB — URINALYSIS, ROUTINE W REFLEX MICROSCOPIC
BILIRUBIN URINE: NEGATIVE
Glucose, UA: NEGATIVE mg/dL
KETONES UR: NEGATIVE mg/dL
NITRITE: NEGATIVE
PH: 6 (ref 5.0–8.0)
Protein, ur: 100 mg/dL — AB
SPECIFIC GRAVITY, URINE: 1.015 (ref 1.005–1.030)
WBC, UA: >50 WBC/hpf — ABNORMAL HIGH (ref 0–5)

## 2018-02-27 LAB — CBC WITH DIFFERENTIAL/PLATELET
Basophils Absolute: 0 10*3/uL (ref 0.0–0.1)
Basophils Relative: 0 %
EOS PCT: 0 %
Eosinophils Absolute: 0 10*3/uL (ref 0.0–0.7)
HCT: 27 % — ABNORMAL LOW (ref 36.0–46.0)
Hemoglobin: 9.1 g/dL — ABNORMAL LOW (ref 12.0–15.0)
LYMPHS PCT: 17 %
Lymphs Abs: 2 10*3/uL (ref 0.7–4.0)
MCH: 29.1 pg (ref 26.0–34.0)
MCHC: 33.7 g/dL (ref 30.0–36.0)
MCV: 86.3 fL (ref 78.0–100.0)
MONO ABS: 0.8 10*3/uL (ref 0.1–1.0)
Monocytes Relative: 7 %
Neutro Abs: 8.5 10*3/uL — ABNORMAL HIGH (ref 1.7–7.7)
Neutrophils Relative %: 76 %
PLATELETS: 265 10*3/uL (ref 150–400)
RBC: 3.13 MIL/uL — ABNORMAL LOW (ref 3.87–5.11)
RDW: 14.9 % (ref 11.5–15.5)
WBC: 11.3 10*3/uL — ABNORMAL HIGH (ref 4.0–10.5)

## 2018-02-27 LAB — COMPREHENSIVE METABOLIC PANEL
ALT: 25 U/L (ref 0–44)
AST: 34 U/L (ref 15–41)
Albumin: 3.5 g/dL (ref 3.5–5.0)
Alkaline Phosphatase: 68 U/L (ref 38–126)
Anion gap: 9 (ref 5–15)
BILIRUBIN TOTAL: 1.8 mg/dL — AB (ref 0.3–1.2)
BUN: 16 mg/dL (ref 8–23)
CHLORIDE: 89 mmol/L — AB (ref 98–111)
CO2: 24 mmol/L (ref 22–32)
CREATININE: 0.86 mg/dL (ref 0.44–1.00)
Calcium: 8.8 mg/dL — ABNORMAL LOW (ref 8.9–10.3)
GFR calc Af Amer: >60 mL/min (ref >60)
GLUCOSE: 150 mg/dL — AB (ref 70–99)
POTASSIUM: 4.2 mmol/L (ref 3.5–5.1)
Sodium: 122 mmol/L — ABNORMAL LOW (ref 135–145)
Total Protein: 6.4 g/dL — ABNORMAL LOW (ref 6.5–8.1)

## 2018-02-27 LAB — TROPONIN I: Troponin I: 0.04 ng/mL (ref <0.03)

## 2018-02-27 LAB — TYPE AND SCREEN
ABO/RH(D): O POS
Antibody Screen: NEGATIVE

## 2018-02-27 LAB — GLUCOSE, CAPILLARY: GLUCOSE-CAPILLARY: 167 mg/dL — AB (ref 70–99)

## 2018-02-27 LAB — BRAIN NATRIURETIC PEPTIDE: B Natriuretic Peptide: >4500 pg/mL — ABNORMAL HIGH (ref 0.0–100.0)

## 2018-02-27 LAB — LACTIC ACID, PLASMA
LACTIC ACID, VENOUS: 2.7 mmol/L — AB (ref 0.5–1.9)
LACTIC ACID, VENOUS: 2.1 mmol/L — AB (ref 0.5–1.9)

## 2018-02-27 MED ORDER — SODIUM CHLORIDE 0.9 % IV SOLN: 1.0000 g | Freq: Once | INTRAVENOUS | Status: DC

## 2018-02-27 MED ORDER — CIPROFLOXACIN IN D5W 400 MG/200ML IV SOLN
400.0000 mg | Freq: Once | INTRAVENOUS | Status: AC
  Administered 2018-02-27: 400 mg via INTRAVENOUS
  Filled 2018-02-27: qty 200

## 2018-02-27 MED ORDER — METOPROLOL SUCCINATE ER 25 MG PO TB24
25.0000 mg | ORAL_TABLET | Freq: Every day | ORAL | Status: DC
  Administered 2018-02-28 – 2018-03-06 (×7): 25 mg via ORAL
  Filled 2018-02-27 (×7): qty 1

## 2018-02-27 MED ORDER — ENOXAPARIN SODIUM 40 MG/0.4ML ~~LOC~~ SOLN
40.0000 mg | SUBCUTANEOUS | Status: DC
  Administered 2018-02-28 – 2018-03-06 (×7): 40 mg via SUBCUTANEOUS
  Filled 2018-02-27 (×7): qty 0.4

## 2018-02-27 MED ORDER — SERTRALINE HCL 50 MG PO TABS
50.0000 mg | ORAL_TABLET | Freq: Every day | ORAL | Status: DC
  Administered 2018-02-28 – 2018-03-06 (×7): 50 mg via ORAL
  Filled 2018-02-27 (×7): qty 1

## 2018-02-27 MED ORDER — INFLUENZA VAC SPLIT HIGH-DOSE 0.5 ML IM SUSY
0.5000 mL | PREFILLED_SYRINGE | INTRAMUSCULAR | Status: AC
  Administered 2018-02-28: 0.5 mL via INTRAMUSCULAR
  Filled 2018-02-27: qty 0.5

## 2018-02-27 MED ORDER — FUROSEMIDE 10 MG/ML IJ SOLN
40.0000 mg | Freq: Two times a day (BID) | INTRAMUSCULAR | Status: DC
  Administered 2018-02-27 – 2018-02-28 (×2): 40 mg via INTRAVENOUS
  Filled 2018-02-27 (×2): qty 4

## 2018-02-27 MED ORDER — CIPROFLOXACIN IN D5W 400 MG/200ML IV SOLN
400.0000 mg | Freq: Two times a day (BID) | INTRAVENOUS | Status: DC
  Administered 2018-02-28: 400 mg via INTRAVENOUS
  Filled 2018-02-27: qty 200

## 2018-02-27 MED ORDER — SODIUM CHLORIDE 0.9% FLUSH
3.0000 mL | Freq: Two times a day (BID) | INTRAVENOUS | Status: DC
  Administered 2018-02-27 – 2018-03-06 (×13): 3 mL via INTRAVENOUS

## 2018-02-27 MED ORDER — ASPIRIN EC 81 MG PO TBEC
81.0000 mg | DELAYED_RELEASE_TABLET | Freq: Every day | ORAL | Status: DC
  Administered 2018-02-28 – 2018-03-06 (×7): 81 mg via ORAL
  Filled 2018-02-27 (×7): qty 1

## 2018-02-27 MED ORDER — PRAVASTATIN SODIUM 40 MG PO TABS
80.0000 mg | ORAL_TABLET | Freq: Every day | ORAL | Status: DC
  Administered 2018-02-28 – 2018-03-05 (×6): 80 mg via ORAL
  Filled 2018-02-27 (×6): qty 2

## 2018-02-27 MED ORDER — MOMETASONE FURO-FORMOTEROL FUM 100-5 MCG/ACT IN AERO
2.0000 | INHALATION_SPRAY | Freq: Two times a day (BID) | RESPIRATORY_TRACT | Status: DC
  Administered 2018-02-28 – 2018-03-06 (×12): 2 via RESPIRATORY_TRACT
  Filled 2018-02-27 (×2): qty 8.8

## 2018-02-27 MED ORDER — INSULIN ASPART 100 UNIT/ML ~~LOC~~ SOLN
0.0000 [IU] | Freq: Three times a day (TID) | SUBCUTANEOUS | Status: DC
  Administered 2018-02-28: 3 [IU] via SUBCUTANEOUS
  Administered 2018-02-28 – 2018-03-01 (×3): 1 [IU] via SUBCUTANEOUS
  Administered 2018-03-01: 2 [IU] via SUBCUTANEOUS
  Administered 2018-03-01: 3 [IU] via SUBCUTANEOUS
  Administered 2018-03-02: 1 [IU] via SUBCUTANEOUS
  Administered 2018-03-02 (×2): 2 [IU] via SUBCUTANEOUS
  Administered 2018-03-03: 1 [IU] via SUBCUTANEOUS
  Administered 2018-03-03: 2 [IU] via SUBCUTANEOUS
  Administered 2018-03-03 – 2018-03-04 (×2): 1 [IU] via SUBCUTANEOUS
  Administered 2018-03-04: 2 [IU] via SUBCUTANEOUS
  Administered 2018-03-04 – 2018-03-05 (×2): 1 [IU] via SUBCUTANEOUS
  Administered 2018-03-05 (×2): 2 [IU] via SUBCUTANEOUS
  Administered 2018-03-06: 1 [IU] via SUBCUTANEOUS
  Administered 2018-03-06: 2 [IU] via SUBCUTANEOUS

## 2018-02-27 MED ORDER — ACETAMINOPHEN 650 MG RE SUPP: 650.0000 mg | Freq: Four times a day (QID) | RECTAL | Status: DC | PRN

## 2018-02-27 MED ORDER — ACETAMINOPHEN 325 MG PO TABS
650.0000 mg | ORAL_TABLET | Freq: Four times a day (QID) | ORAL | Status: DC | PRN
  Administered 2018-03-05: 650 mg via ORAL
  Filled 2018-02-27 (×2): qty 2

## 2018-02-27 MED ORDER — GABAPENTIN 300 MG PO CAPS
300.0000 mg | ORAL_CAPSULE | Freq: Three times a day (TID) | ORAL | Status: DC
  Administered 2018-02-27 – 2018-03-06 (×19): 300 mg via ORAL
  Filled 2018-02-27 (×19): qty 1

## 2018-02-27 MED ORDER — ALPRAZOLAM 0.25 MG PO TABS
0.2500 mg | ORAL_TABLET | Freq: Every evening | ORAL | Status: DC | PRN
  Administered 2018-02-27 – 2018-03-04 (×4): 0.25 mg via ORAL
  Filled 2018-02-27 (×4): qty 1

## 2018-02-27 MED ORDER — ACETAMINOPHEN 325 MG PO TABS
650.0000 mg | ORAL_TABLET | Freq: Four times a day (QID) | ORAL | Status: DC | PRN
  Administered 2018-02-28 – 2018-03-03 (×5): 650 mg via ORAL
  Filled 2018-02-27 (×4): qty 2

## 2018-02-27 MED ORDER — ONDANSETRON HCL 4 MG/2ML IJ SOLN: 4.0000 mg | Freq: Four times a day (QID) | INTRAMUSCULAR | Status: DC | PRN

## 2018-02-27 MED ORDER — LEVOTHYROXINE SODIUM 100 MCG PO TABS
200.0000 ug | ORAL_TABLET | Freq: Every day | ORAL | Status: DC
  Administered 2018-02-28 – 2018-03-06 (×7): 200 ug via ORAL
  Filled 2018-02-27 (×7): qty 2

## 2018-02-27 MED ORDER — SODIUM CHLORIDE 0.9% FLUSH: 3.0000 mL | INTRAVENOUS | Status: DC | PRN

## 2018-02-27 MED ORDER — SODIUM CHLORIDE 0.9 % IV SOLN: 250.0000 mL | INTRAVENOUS | Status: DC | PRN

## 2018-02-27 MED ORDER — ALBUTEROL SULFATE (2.5 MG/3ML) 0.083% IN NEBU
3.0000 mL | INHALATION_SOLUTION | Freq: Four times a day (QID) | RESPIRATORY_TRACT | Status: DC | PRN
  Administered 2018-03-04: 3 mL via RESPIRATORY_TRACT
  Filled 2018-02-27: qty 3

## 2018-02-27 MED ORDER — LACTATED RINGERS IV BOLUS
1000.0000 mL | Freq: Once | INTRAVENOUS | Status: AC
  Administered 2018-02-27: 1000 mL via INTRAVENOUS

## 2018-02-27 MED ORDER — ONDANSETRON HCL 4 MG PO TABS: 4.0000 mg | ORAL_TABLET | Freq: Four times a day (QID) | ORAL | Status: DC | PRN

## 2018-02-27 NOTE — Patient Instructions (Signed)
Weakness  Weakness is a lack of strength. You may feel weak all over your body (generalized), or you may feel weak in one specific part of your body (focal). Common causes of weakness include:   Infection and immune system disorders.   Physical exhaustion.   Internal bleeding or other blood loss that results in a lack of red blood cells (anemia).   Dehydration.   An imbalance in mineral (electrolyte) levels, such as potassium.   Heart disease, circulation problems, or stroke.    Other causes include:   Some medicines or cancer treatment.   Stress, anxiety, or depression.   Nervous system disorders.   Thyroid disorders.   Loss of muscle strength because of age or inactivity.   Poor sleep quality or sleep disorders.    The cause of your weakness may not be known. Some causes of weakness can be serious, so it is important to see your health care provider.  Follow these instructions at home:   Rest as needed.   Try to get enough sleep. Talk to your health care provider about how much sleep you need each night.   Take over-the-counter and prescription medicines only as told by your health care provider.   Eat a healthy, well-balanced diet. This includes:  ? Proteins to build muscles, such as lean meats and fish.  ? Fresh fruits and vegetables.  ? Carbohydrates to boost energy, such as whole grains.   Drink enough fluid to keep your urine clear or pale yellow.   Do strength exercises, such as arm curls and leg raises, for 30 minutes at least 2 days a week or as told by your health care provider.   Consider working with a physical therapist or trainer who can develop an exercise plan to help you gain muscle strength.   Keep all follow-up visits as told by your health care provider. This is important.  Contact a health care provider if:   Your weakness does not improve or gets worse.   Your weakness affects your ability to think clearly.   Your weakness affects your ability to do your normal daily  activities.  Get help right away if:   You develop sudden weakness.   You have trouble breathing or shortness of breath.   You have problems with your vision.   You have trouble talking or swallowing.   You have trouble standing or walking.   You have chest pain.   You are light-headed or lose consciousness.  This information is not intended to replace advice given to you by your health care provider. Make sure you discuss any questions you have with your health care provider.  Document Released: 05/28/2005 Document Revised: 06/23/2015 Document Reviewed: 03/18/2015  Elsevier Interactive Patient Education  2018 [employer]vier Inc[employer]

## 2018-02-27 NOTE — ED Notes (Signed)
Date and time results received: 02/27/18 1906 (use smartphrase ".now" to insert current time)  Test: Lactic Acid Critical Value: 2.7  Name of Provider Notified: Mesner  Orders Received? Or Actions Taken?:

## 2018-02-27 NOTE — H&P (Signed)
TR[facility] H&P    Patient Demographics:    [name] [name], is a 84 y.o. female  MRN: [id number]  DOB - 1933/01/28  Admit Date - 02/27/2018  Referring MD/NP/PA: Dr. Dayna Barker  Outpatient Primary MD for the patient is Sharion Balloon, FNP  Patient coming from: Home  Chief complaint-shortness of breath   HPI:    [name] [name]  is a 84 y.o. female, with history of CAD, CVA, chronic diastolic and systolic CHF last EF 35 to 40% as per echo from January 2019, status post pacemaker placement, diabetes mellitus type 2, hypertension who was recently discharged from the hospital on 02/25/2018 today was brought to the hospital for worsening shortness of breath on exertion, multiple falls at home. Patient says that she gets short of breath on walking, denies chest pain.  Denies nausea vomiting or diarrhea.  Denies fever or chills.  Complains of dysuria. In the ED, patient was found to have hyponatremia with sodium 122, BNP significantly elevated more than 4500, troponin 0.04.  Chest x-ray shows bilateral pleural effusions.  Also found to have abnormal UA and started on ciprofloxacin for UTI.    Review of systems:    In addition to the HPI above,    All other systems reviewed and are negative.   With Past History of the following :    Past Medical History:  Diagnosis Date  . ANXIETY   . Arteriosclerotic cardiovascular disease (ASCVD)    Cath in 9/99-70% mid LAD with diffuse distal disease, 70% T1, 60% mid circumflex, 50% mid RCA, normal ejection fraction; negative stress nuclear in 07/2008  . Carpal tunnel syndrome   . Cerebrovascular disease    carotid bruits; no focal disease in 1999, 2002 and 2006  . Closed left fibular fracture 11/30/11  . COPD   . Diabetes mellitus    Insulin treatment  . Diastolic CHF, chronic (HCC)    Normal EF  . Gastroesophageal reflux disease    With hiatal hernia; irritable bowel syndrome; H.  pylori-treated; Colonoscopy 2008: non-specific colitis, IH, Diverticulosis, Rectal ulcer secondary to ASA  . HOH (hard of hearing)   . Hyperlipidemia   . Hypertension   . Hypothyroidism   . OSTEOARTHRITIS, KNEES, BILATERAL    Bilateral TKA  . Pacemaker   . Peripheral neuropathy   . Sinoatrial node dysfunction ([place]) 2003   Medtronic dual-chamber device  . Sleep apnea    pt could not tolerate  . VITAMIN B12 DEFICIENCY       Past Surgical History:  Procedure Laterality Date  . ABDOMINAL HYSTERECTOMY    . BLADDER REPAIR    . CARDIAC PACEMAKER PLACEMENT  2003   Medtronic, dual-chamber  . CARPAL TUNNEL RELEASE    . CATARACT EXTRACTION W/PHACO Left 08/09/2017   Procedure: CATARACT EXTRACTION PHACO AND INTRAOCULAR LENS PLACEMENT (IOC);  Surgeon: Baruch Goldmann, MD;  Location: [facility];  Service: Ophthalmology;  Laterality: Left;  CDE: 6.26  . CHOLECYSTECTOMY    . COLONOSCOPY  2008  . HEMORRHOID SURGERY    . NASAL FRACTURE SURGERY    .  THYROIDECTOMY  1980   Goiter  . TOTAL KNEE ARTHROPLASTY     Bilateral      Social History:      Social History   Tobacco Use  . Smoking status: Never Smoker  . Smokeless tobacco: Never Used  Substance Use Topics  . Alcohol use: No    Alcohol/week: 0.0 standard drinks       Family History :     Family History  Problem Relation Age of Onset  . COPD Father   . Heart attack Father 68  . Heart failure Mother   . Cancer Sister        colon  . Cancer Brother        lung  . Cancer Sister        bladder  . Cancer Brother        lung  . Early death Son   . Diabetes Son   . Heart disease Son   . Cancer Son        prostate  . Cancer Sister        kidney cancer  . Diabetes Sister   . Cancer Daughter       Home Medications:   Prior to Admission medications   Medication Sig Start Date End Date Taking? Authorizing Provider  acetaminophen (TYLENOL) 325 MG tablet Take 2 tablets (650 mg total) by mouth every 6 (six) hours as needed  for mild pain (or Fever >/= 101). 02/25/18   Roxan Hockey, MD  ALPRAZolam Duanne Moron) 0.5 MG tablet Take 1 tablet (0.5 mg total) by mouth at bedtime as needed for anxiety. 11/13/17   Evelina Dun A, FNP  aspirin EC 81 MG tablet Take 81 mg by mouth daily. Reported on 12/14/2015    [provider]  budesonide-formoterol (SYMBICORT) 80-4.5 MCG/ACT inhaler 2 PUFFS 2 TIMES A DAY 08/07/17   Hawks, Alyse Low A, FNP  furosemide (LASIX) 40 MG tablet Take 1 tablet (40 mg total) by mouth daily. 02/25/18   Roxan Hockey, MD  gabapentin (NEURONTIN) 300 MG capsule TAKE (1) CAPSULE THREE TIMES DAILY. 01/20/18   Evelina Dun A, FNP  glucose blood (GLUCOSE METER TEST) test strip Use BID 07/11/17   Hawks, Alyse Low A, FNP  levothyroxine (SYNTHROID, LEVOTHROID) 200 MCG tablet TAKE (1) TABLET DAILY BE- FORE BREAKFAST. 03/29/17   Sharion Balloon, FNP  lisinopril (PRINIVIL,ZESTRIL) 10 MG tablet TAKE 1 TABLET DAILY 02/21/18   Evelina Dun A, FNP  metFORMIN (GLUCOPHAGE) 1000 MG tablet TAKE (1) TABLET TWICE A DAY WITH MEALS (BREAKFAST AND SUPPER) FOR DIAB ETES 01/24/18   Evelina Dun A, FNP  metoprolol succinate (TOPROL-XL) 25 MG 24 hr tablet TAKE (1) TABLET DAILY WITH FOOD. 12/13/17   Evelina Dun A, FNP  nitroGLYCERIN (NITROLINGUAL) 0.4 MG/SPRAY spray Place 1 spray under the tongue every 5 (five) minutes as needed. 07/09/17   Strader, Fransisco Hertz, PA-C  nystatin-triamcinolone (MYCOLOG II) cream APPLY TO AFFECTED AREA 3 TIMES A DAY 12/28/17   Evelina Dun A, FNP  pravastatin (PRAVACHOL) 80 MG tablet TAKE 1 TABLET ONCE DAILY FOR CHOLESTEROL 02/21/18   Sharion Balloon, FNP  PROAIR HFA 108 [phone] Base) MCG/ACT inhaler 2 PUFFS EVERY 6 HOURS AS NEEDED FOR WHEEZING 08/07/17   Evelina Dun A, FNP  sertraline (ZOLOFT) 50 MG tablet TAKE 1 TABLET DAILY 02/21/18   Evelina Dun A, FNP  tamsulosin (FLOMAX) 0.4 MG CAPS capsule Take 1 capsule (0.4 mg total) by mouth daily after supper. 02/25/18   Roxan Hockey, MD  Allergies:       Allergies  Allergen Reactions  . Penicillins     Has patient had a PCN reaction causing immediate rash, facial/tongue/throat swelling, SOB or lightheadedness with hypotension: Unknown Has patient had a PCN reaction causing severe rash involving mucus membranes or skin necrosis: Unknown Has patient had a PCN reaction that required hospitalization: Unknown Has patient had a PCN reaction occurring within the last 10 years: No If all of the above answers are "NO", then may proceed with Cephalosporin use.      Physical Exam:   Vitals  Blood pressure (!) 168/73, pulse 64, temperature 97.8 F (36.6 C), temperature source Oral, resp. rate 18, height _0  (1.778 m), weight 83.9 kg, SpO2 98 %.  1.  General: Appears in no acute distress  2. Psychiatric:  Intact judgement and  insight, awake alert, oriented x 3.  3. Neurologic: No focal neurological deficits, all cranial nerves intact.Strength 5/5 all 4 extremities, sensation intact all 4 extremities, plantars down going.  4. Eyes :  anicteric sclerae, moist conjunctivae with no lid lag. PERRLA.  5. ENMT:  Oropharynx clear with moist mucous membranes and good dentition  6. Neck:  supple, no cervical lymphadenopathy appriciated, No thyromegaly  7. Respiratory : Mild tachypnea, good air movement bilaterally,clear to  auscultation bilaterally  8. Cardiovascular : RRR, no gallops, rubs or murmurs, bilateral 1+ pitting edema of the lower extremities  9. Gastrointestinal:  Positive bowel sounds, abdomen soft, non-tender to palpation,no hepatosplenomegaly, no rigidity or guarding       10. Skin:  No cyanosis, normal texture and turgor, no rash, lesions or ulcers  11.Musculoskeletal:  Good muscle tone,  joints appear normal , no effusions,  normal range of motion    Data Review:    CBC Recent Labs  Lab 02/23/18 1200 02/24/18 0544 02/25/18 0540 02/27/18 1755  WBC 8.1 6.9 7.0 11.3*  HGB 10.3* 8.3* 7.7* 9.1*  HCT  30.5* 25.3* 22.7* 27.0*  PLT 204 184 155 265  MCV 85.0 86.9 86.6 86.3  MCH 28.7 28.5 29.4 29.1  MCHC 33.8 32.8 33.9 33.7  RDW 14.5 13.9 14.7 14.9  LYMPHSABS 0.9  --   --  2.0  MONOABS 0.6  --   --  0.8  EOSABS 0.0  --   --  0.0  BASOSABS 0.0  --   --  0.0   ------------------------------------------------------------------------------------------------------------------  Results for orders placed or performed during the hospital encounter of 02/27/18 (from the past 48 hour(s))  Brain natriuretic peptide     Status: Abnormal   Collection Time: 02/27/18  5:54 PM  Result Value Ref Range   B Natriuretic Peptide >4,500.0 (H) 0.0 - 100.0 pg/mL    Comment: Performed at [facility], [address], [place], [place] [address]  Troponin I     Status: Abnormal   Collection Time: 02/27/18  5:54 PM  Result Value Ref Range   Troponin I 0.04 (HH) <0.03 ng/mL    Comment: CRITICAL RESULT CALLED TO, READ BACK BY AND VERIFIED WITH: MYRICK,B ON 02/27/18 AT 1945 BY LOY,C Performed at [facility], [address], [place], [place] [address]   CBC with Differential     Status: Abnormal   Collection Time: 02/27/18  5:55 PM  Result Value Ref Range   WBC 11.3 (H) 4.0 - 10.5 K/uL   RBC 3.13 (L) 3.87 - 5.11 MIL/uL   Hemoglobin 9.1 (L) 12.0 - 15.0 g/dL   HCT 27.0 (L) 36.0 - 46.0 %  MCV 86.3 78.0 - 100.0 fL   MCH 29.1 26.0 - 34.0 pg   MCHC 33.7 30.0 - 36.0 g/dL   RDW 14.9 11.5 - 15.5 %   Platelets 265 150 - 400 K/uL   Neutrophils Relative % 76 %   Neutro Abs 8.5 (H) 1.7 - 7.7 K/uL   Lymphocytes Relative 17 %   Lymphs Abs 2.0 0.7 - 4.0 K/uL   Monocytes Relative 7 %   Monocytes Absolute 0.8 0.1 - 1.0 K/uL   Eosinophils Relative 0 %   Eosinophils Absolute 0.0 0.0 - 0.7 K/uL   Basophils Relative 0 %   Basophils Absolute 0.0 0.0 - 0.1 K/uL    Comment: Performed at [facility], [address], [place], [place] [address]  Comprehensive metabolic panel     Status: Abnormal   Collection Time: 02/27/18   5:55 PM  Result Value Ref Range   Sodium 122 (L) 135 - 145 mmol/L   Potassium 4.2 3.5 - 5.1 mmol/L   Chloride 89 (L) 98 - 111 mmol/L   CO2 24 22 - 32 mmol/L   Glucose, Bld 150 (H) 70 - 99 mg/dL   BUN 16 8 - 23 mg/dL   Creatinine, Ser 0.86 0.44 - 1.00 mg/dL   Calcium 8.8 (L) 8.9 - 10.3 mg/dL   Total Protein 6.4 (L) 6.5 - 8.1 g/dL   Albumin 3.5 3.5 - 5.0 g/dL   AST 34 15 - 41 U/L   ALT 25 0 - 44 U/L   Alkaline Phosphatase 68 38 - 126 U/L   Total Bilirubin 1.8 (H) 0.3 - 1.2 mg/dL   GFR calc non Af Amer >60 >60 mL/min   GFR calc Af Amer >60 >60 mL/min    Comment: (NOTE) The eGFR has been calculated using the CKD EPI equation. This calculation has not been validated in all clinical situations. eGFR's persistently <60 mL/min signify possible Chronic Kidney Disease.    Anion gap 9 5 - 15    Comment: Performed at [facility], [address], [place], [place] [address]  Type and screen     Status: None (Preliminary result)   Collection Time: 02/27/18  5:55 PM  Result Value Ref Range   ABO/RH(D) O POS    Antibody Screen PENDING    Sample Expiration      03/02/2018 Performed at [facility], [address], [place], [place] [address]   Lactic acid, plasma     Status: Abnormal   Collection Time: 02/27/18  5:55 PM  Result Value Ref Range   Lactic Acid, Venous 2.7 (HH) 0.5 - 1.9 mmol/L    Comment: CRITICAL RESULT CALLED TO, READ BACK BY AND VERIFIED WITH: NORMAN,B ON 02/27/18 AT 1900 BY LOY,C Performed at [facility], [address], [place], [place] [address]   Lactic acid, plasma     Status: Abnormal   Collection Time: 02/27/18  7:07 PM  Result Value Ref Range   Lactic Acid, Venous 2.1 (HH) 0.5 - 1.9 mmol/L    Comment: CRITICAL RESULT CALLED TO, READ BACK BY AND VERIFIED WITH: NICHOLS,K ON 02/27/18 AT 2025 BY LOY,C Performed at [facility], [address] [place],  [address]   Urinalysis, Routine w reflex microscopic     Status: Abnormal   Collection Time: 02/27/18  8:32  PM  Result Value Ref Range   Color, Urine AMBER (A) YELLOW    Comment: BIOCHEMICALS MAY BE AFFECTED BY COLOR   APPearance CLOUDY (A) CLEAR   Specific Gravity,  Urine 1.015 1.005 - 1.030   pH 6.0 5.0 - 8.0   Glucose, UA NEGATIVE NEGATIVE mg/dL   Hgb urine dipstick LARGE (A) NEGATIVE   Bilirubin Urine NEGATIVE NEGATIVE   Ketones, ur NEGATIVE NEGATIVE mg/dL   Protein, ur 100 (A) NEGATIVE mg/dL   Nitrite NEGATIVE NEGATIVE   Leukocytes, UA LARGE (A) NEGATIVE   RBC / HPF >50 (H) 0 - 5 RBC/hpf   WBC, UA >50 (H) 0 - 5 WBC/hpf   Bacteria, UA RARE (A) NONE SEEN   Squamous Epithelial / LPF 0-5 0 - 5   WBC Clumps PRESENT     Comment: Performed at [facility], [address], [place], [place] [address]    Chemistries  Recent Labs  Lab 02/23/18 1200 02/24/18 0544 02/25/18 0540 02/27/18 1755  NA 125* 127* 125* 122*  K 3.6 3.8 3.2* 4.2  CL 88* 92* 92* 89*  CO2 _0 GLUCOSE 166* 148* 138* 150*  BUN _1 CREATININE 0.77 0.72 0.70 0.86  CALCIUM 8.7* 8.3* 8.0* 8.8*  AST 41  --   --  34  ALT 36  --   --  25  ALKPHOS 56  --   --  68  BILITOT 1.2  --   --  1.8*   ------------------------------------------------------------------------------------------------------------------  ------------------------------------------------------------------------------------------------------------------ GFR: Estimated Creatinine Clearance: 58.4 mL/min (by C-G formula based on SCr of 0.86 mg/dL). Liver Function Tests: Recent Labs  Lab 02/23/18 1200 02/27/18 1755  AST 41 34  ALT 36 25  ALKPHOS 56 68  BILITOT 1.2 1.8*  PROT 5.9* 6.4*  ALBUMIN 3.4* 3.5   No results for input(s): LIPASE, AMYLASE in the last 168 hours. No results for input(s): AMMONIA in the last 168 hours. Coagulation Profile: No results for input(s): INR, PROTIME in the last 168 hours. Cardiac Enzymes: Recent Labs  Lab 02/23/18 1200 02/23/18 1743 02/23/18 2345 02/24/18 0544 02/27/18 1754  CKTOTAL 61  --    --   --   --   TROPONINI 0.04* 0.05* 0.05* 0.04* 0.04*   BNP (last 3 results) No results for input(s): PROBNP in the last 8760 hours. HbA1C: No results for input(s): HGBA1C in the last 72 hours. CBG: Recent Labs  Lab 02/24/18 1724 02/24/18 2118 02/25/18 0807 02/25/18 1121 02/25/18 1627  GLUCAP 134* 125* 142* 122* 153*    --------------------------------------------------------------------------------------------------------------- Urine analysis:    Component Value Date/Time   COLORURINE AMBER (A) 02/27/2018 2032   APPEARANCEUR CLOUDY (A) 02/27/2018 2032   APPEARANCEUR Clear 01/08/2018 1442   LABSPEC 1.015 02/27/2018 2032   PHURINE 6.0 02/27/2018 2032   GLUCOSEU NEGATIVE 02/27/2018 2032   GLUCOSEU NEGATIVE 12/23/2012 1110   HGBUR LARGE (A) 02/27/2018 2032   HGBUR large 05/16/2010 0746   BILIRUBINUR NEGATIVE 02/27/2018 2032   BILIRUBINUR Negative 01/08/2018 [place] 02/27/2018 2032   PROTEINUR 100 (A) 02/27/2018 2032   UROBILINOGEN negative 07/05/2015 1431   UROBILINOGEN 0.2 12/23/2012 1110   NITRITE NEGATIVE 02/27/2018 2032   LEUKOCYTESUR LARGE (A) 02/27/2018 2032   LEUKOCYTESUR 1+ (A) 01/08/2018 1442      Imaging Results:    Dg Chest 2 View  Result Date: 02/27/2018 CLINICAL DATA:  Multiple recent falls. EXAM: CHEST - 2 VIEW COMPARISON:  Chest radiograph 02/23/2018 FINDINGS: Left chest wall 2 lead pacemaker with unchanged position of leads. There is moderate cardiomegaly with small bilateral pleural effusions and associated atelectasis. No focal airspace consolidation or pulmonary edema. No fracture visualized. IMPRESSION: Moderate  cardiomegaly and small pleural effusions with associated atelectasis. Electronically Signed   By: Ulyses Jarred M.D.   On: 02/27/2018 18:53   Dg Pelvis 1-2 Views  Result Date: 02/27/2018 CLINICAL DATA:  Multiple falls, evaluate for fracture. EXAM: PELVIS - 1-2 VIEW COMPARISON:  10/15/2016 FINDINGS: Moderate-to-marked  degenerative disc disease and endplate spurring of the included lumbar spine from L3 through S1. The bony pelvis appears intact. There is mild osteoarthritic change of the right SI joint with sclerosis. No pelvic diastasis or fracture. Mild enthesopathy off the anterior superior iliac spines, left greater than right. Joint space narrowing of both hips without fracture or suspicious osseous lesions. IMPRESSION: Lumbar spondylosis. No acute pelvic or hip fracture is identified. If the patient has pain out of proportion to radiographic findings, CT may help identify radiographically occult fractures. Electronically Signed   By: Ashley Royalty M.D.   On: 02/27/2018 18:53    My personal review of EKG: Rhythm paced rhythm   Assessment & Plan:    Active Problems:   Sinoatrial node dysfunction (HCC)   Type II diabetes mellitus with peripheral autonomic neuropathy (HCC)   Elevated troponin   Acute on chronic combined systolic and diastolic CHF (congestive heart failure) (HCC)   CHF exacerbation (HCC) Hyponatremia  1. UTI-patient has abnormal UA, urine culture has been obtained.  Started on ciprofloxacin.  Continue ciprofloxacin per pharmacy consultation.  Follow urine culture results.  2. Acute on chronic diastolic and systolic CHF-patient has significantly elevated BNP, bilateral lower extremity edema with dyspnea on exertion.  She takes Lasix 40 mg p.o. daily at home.  Will start Lasix 40 mg IV every 12 hours.  Strict intake and output.  Daily weights.  Follow BMP in am.  Will consult cardiology for further recommendations.  3. Diabetes mellitus type 2-we will start sliding scale insulin with NovoLog.  Hold oral hypoglycemic agents.  4. Hyponatremia-sodium is 122 likely from fluid overload state, CHF.  Patient does have chronic hyponatremia with sodium usually running around 125.  Started on IV Lasix as above.  Follow BMP in am.  5. Elevated troponin-patient has mild elevation of troponin, likely from  demand ischemia from CHF.  Will cycle troponin every 6 hours x3.  Cardiology consultation has been obtained as above.  EKG shows paced rhythm  6. Anxiety/insomnia-continue Xanax as needed.  7. Generalized weakness/falls-likely from underlying CHF exacerbation, UTI.  Will obtain PT evaluation in a.m.  Patient may need to go to rehab.   DVT Prophylaxis-   Lovenox   AM Labs Ordered, also please review Full Orders  Family Communication: Admission, patients condition and plan of care including tests being ordered have been discussed with the patient and her daughter at bedside.GSLPROGRESSNOTE * who indicate understanding and agree with the plan and Code Status.  Code Status: Full code  Admission status: Inpatient  Time spent in minutes : 60 minutes   Oswald Hillock M.D on 02/27/2018 at 9:53 PM  Between 7am to 7pm - Pager - [phone]. After 7pm go to [email or web address] - password [facility]  [facility] - Office  [phone]

## 2018-02-27 NOTE — Progress Notes (Signed)
Pharmacy Antibiotic Note  [name] M FlippiDominic [name] is a 84 y.o. female admitted on 02/27/2018 with UTI.  Pharmacy has been consulted for Cipro dosing.  Initial dose given in ED.  Plan: Cipro 400mg  IV every 12 hours. Dose stable for age, weight, renal function and indication. No pharmacokinetic monitoring needed. Sign off.   Height: 5\' 10"  (177.8 cm) Weight: 184 lb 15.5 oz (83.9 kg) IBW/kg (Calculated) : 68.5  Temp (24hrs), Avg:98 F (36.7 C), Min:97.8 F (36.6 C), Max:98.1 F (36.7 C)  Recent Labs  Lab 02/23/18 1200 02/24/18 0544 02/25/18 0540 02/27/18 1755 02/27/18 1907  WBC 8.1 6.9 7.0 11.3*  --   CREATININE 0.77 0.72 0.70 0.86  --   LATICACIDVEN  --   --   --  2.7* 2.1*    Estimated Creatinine Clearance: 58.4 mL/min (by C-G formula based on SCr of 0.86 mg/dL).    Allergies  Allergen Reactions  . Penicillins     Has patient had a PCN reaction causing immediate rash, facial/tongue/throat swelling, SOB or lightheadedness with hypotension: Unknown Has patient had a PCN reaction causing severe rash involving mucus membranes or skin necrosis: Unknown Has patient had a PCN reaction that required hospitalization: Unknown Has patient had a PCN reaction occurring within the last 10 years: No If all of the above answers are "NO", then may proceed with Cephalosporin use.     Antimicrobials this admission: Cipro 9/19 >>    >>   Dose adjustments this admission: n/a   Microbiology results: 9/19 BCx: pending 9/19 UCx: pending   Sputum:   9/16 MRSA PCR: (-)  Thank you for allowing pharmacy to be a part of this patient's care.  Mady GemmaHayes, Romel Dumond R 02/27/2018 10:10 PM

## 2018-02-27 NOTE — ED Notes (Signed)
Pt placed on purewick 

## 2018-02-27 NOTE — ED Notes (Signed)
Date and time results received: 02/27/18 8:26 PM  (use smartphrase ".now" to insert current time) Test:  Lactic  Critical Value: 2.1  Name of Provider Notified: Mesner  Orders Received? Or Actions Taken?: .

## 2018-02-27 NOTE — ED Notes (Signed)
CRITICAL VALUE ALERT  Critical Value:  Troponin 0.04  Date & Time Notied:  02/27/18 1949  Provider Notified: dr.mesner  Orders Received/Actions taken: md notified

## 2018-02-27 NOTE — ED Triage Notes (Signed)
[facility], community paramedic came with pt and reports pt was discharged recently from the hospital to go live with her daughter.  According to paramedic, pt has had multiple falls and her home has bed bugs.  Reports daughter agreed to take her home with her but daughter has cancer and is scheduled for a procedure Tuesday.  Reports the rest of the family is going on a trip and no one can watch the pt.  Pt unable to stay home alone per community paramedic.  States pt can not walk at this time.

## 2018-02-27 NOTE — Progress Notes (Signed)
   Subjective:    Patient ID: [name] [name], female    DOB: 07-Dec-1932, 84 y.o.   MRN: [id number]  Chief Complaint  Patient presents with  . Hospitalization Follow-up    pt here today for follow up needing to be placed into Nursing Facility     HPI Pt presents to the office today by EMS. Pt fell on 02/13/18 and went to the ED and was admitted and discharged on 02/25/18. Pt could not go home because her house was infested with bedbugs.   She went home to her daughters home, but her daughter can not help with lifting patient. Her daughter works full time and both daughters are going out of town and this would leave the patient home by herself. The daughter, [name] [name], is having surgery next week and is unable to care for her mother at this time. Per hospital note patient refused SNF and daughter had agreed to take care of patient, even though she can not physically able to care for mother.   She can not stand without help and has generalized weakness. She has had multiple falls. Pt is not safe to be left by herself. She is unable to perform daily ADL's without assistance.    Review of Systems  Constitutional: Positive for fatigue.  Skin:       bruises  Neurological: Positive for weakness.       Objective:   Physical Exam  Constitutional: She is oriented to person, place, and time. She appears well-developed and well-nourished. No distress.  HENT:  Head: Normocephalic.  Eyes: Pupils are equal, round, and reactive to light.  Neck: Normal range of motion. Neck supple. No thyromegaly present.  Cardiovascular: Normal rate, regular rhythm, normal heart sounds and intact distal pulses.  No murmur heard. Pulmonary/Chest: Effort normal and breath sounds normal. No respiratory distress. She has no wheezes.  Abdominal: Soft. Bowel sounds are normal. She exhibits no distension. There is no tenderness.  Musculoskeletal: She exhibits edema (2+ in BLE). She exhibits no tenderness.  Generalized  weakness, pt on gurney  Neurological: She is alert and oriented to person, place, and time. She has normal reflexes. No cranial nerve deficit.  Skin: Skin is warm and dry. Ecchymosis noted.     Psychiatric: She has a normal mood and affect. Her behavior is normal. Judgment and thought content normal.  Vitals reviewed.     BP (!) 169/81   Pulse 64   Temp 98.1 F (36.7 C) (Oral)      Assessment & Plan:  [name] [name] comes in today with chief complaint of Hospitalization Follow-up (pt here today for follow up needing to be placed into Nursing Facility )   Diagnosis and orders addressed:  1. Weakness  2. Frequent falls  3. Does not feel safe at home  We attempted to call SNF's to see if we could get patient admitted today. We have sent their records but no definite answer. Since all of her family is going out of town tomorrow, I can not send patient home by herself for safety. She is unable to stand or walk. She has had multiple falls and injuries related to these falls. I will send to ED and hopefully have her discharged to SNF.    Greater than 30 mins spent with patient.    Jannifer Rodneyhristy Blessen Kimbrough, FNP

## 2018-02-27 NOTE — ED Provider Notes (Signed)
Emergency Department Provider Note   I have reviewed the triage vital signs and the nursing notes.   HISTORY  Chief Complaint Weakness   HPI [name] [name] is a 84 y.o. female with multiple past medical problems are presents the emergency department today for continued weakness and shortness of breath that worsened since leaving the hospital.  Is not able to be cared for appropriately at home and a danger to go home.  Patient had multiple falls over the last couple days.  Went to see her primary doctor today to try to get nursing home placement but not able to do so so sent to the emergency room.  Patient's only complaints are intermittent chest pain but not right now.  She has some intermittent dyspnea on exertion and weakness.  I reviewed the records it does appear that she was anemic when she was here last time and they recommended skilled nursing facility placement however the patient refusing at home with her daughter.  No other associated or modifying symptoms.    Past Medical History:  Diagnosis Date  . ANXIETY   . Arteriosclerotic cardiovascular disease (ASCVD)    Cath in 9/99-70% mid LAD with diffuse distal disease, 70% T1, 60% mid circumflex, 50% mid RCA, normal ejection fraction; negative stress nuclear in 07/2008  . Carpal tunnel syndrome   . Cerebrovascular disease    carotid bruits; no focal disease in 1999, 2002 and 2006  . Closed left fibular fracture 11/30/11  . COPD   . Diabetes mellitus    Insulin treatment  . Diastolic CHF, chronic (HCC)    Normal EF  . Gastroesophageal reflux disease    With hiatal hernia; irritable bowel syndrome; H. pylori-treated; Colonoscopy 2008: non-specific colitis, IH, Diverticulosis, Rectal ulcer secondary to ASA  . HOH (hard of hearing)   . Hyperlipidemia   . Hypertension   . Hypothyroidism   . OSTEOARTHRITIS, KNEES, BILATERAL    Bilateral TKA  . Pacemaker   . Peripheral neuropathy   . Sinoatrial node dysfunction (HCC) 2003   Medtronic dual-chamber device  . Sleep apnea    pt could not tolerate  . VITAMIN B12 DEFICIENCY     Patient Active Problem List   Diagnosis Date Noted  . CHF exacerbation (HCC) 02/27/2018  . Fall at home, initial encounter 02/23/2018  . Fall 02/23/2018  . Aortic atherosclerosis (HCC) 10/15/2017  . Chronic combined systolic (congestive) and diastolic (congestive) heart failure (HCC) 07/02/2017  . Pleural effusion   . Palliative care encounter   . Goals of care, counseling/discussion   . Pressure injury of skin 06/29/2017  . Elevated troponin 06/28/2017  . Normocytic anemia 06/28/2017  . Sleep apnea 06/28/2017  . Tinea cruris 06/28/2017  . Hyperlipidemia associated with type 2 diabetes mellitus (HCC) 05/14/2017  . Chronic back pain 01/28/2017  . PVD (peripheral vascular disease) (HCC) 01/27/2016  . Skin moniliasis 05/31/2014  . OAB (overactive bladder) 12/23/2012  . Insomnia 04/06/2011  . Sinoatrial node dysfunction (HCC)   . Gastroesophageal reflux disease   . Type II diabetes mellitus with peripheral autonomic neuropathy (HCC)   . GAD (generalized anxiety disorder) 12/30/2009  . Diabetic neuropathy (HCC) 12/26/2009  . Vitamin B 12 deficiency 10/11/2009  . Morbid obesity (HCC) 09/16/2009  . Arteriosclerotic cardiovascular disease (ASCVD) 09/15/2009  . CEREBROVASCULAR DISEASE 09/15/2009  . Hypertension associated with diabetes (HCC) 08/29/2009  . Osteoarthrosis involving lower leg 08/29/2009  . COLONIC POLYPS, ADENOMATOUS, HX OF 02/22/2009  . Hypothyroid 02/17/2009  . Chronic obstructive  pulmonary disease (HCC) 02/17/2009  . Chronic diastolic heart failure (HCC) 12/15/2008  . PPM-Medtronic 12/15/2008    Past Surgical History:  Procedure Laterality Date  . ABDOMINAL HYSTERECTOMY    . BLADDER REPAIR    . CARDIAC PACEMAKER PLACEMENT  2003   Medtronic, dual-chamber  . CARPAL TUNNEL RELEASE    . CATARACT EXTRACTION W/PHACO Left 08/09/2017   Procedure: CATARACT EXTRACTION  PHACO AND INTRAOCULAR LENS PLACEMENT (IOC);  Surgeon: Fabio Pierce, MD;  Location: [facility];  Service: Ophthalmology;  Laterality: Left;  CDE: 6.26  . CHOLECYSTECTOMY    . COLONOSCOPY  2008  . HEMORRHOID SURGERY    . NASAL FRACTURE SURGERY    . THYROIDECTOMY  1980   Goiter  . TOTAL KNEE ARTHROPLASTY     Bilateral      Allergies Penicillins  Family History  Problem Relation Age of Onset  . COPD Father   . Heart attack Father 30  . Heart failure Mother   . Cancer Sister        colon  . Cancer Brother        lung  . Cancer Sister        bladder  . Cancer Brother        lung  . Early death Son   . Diabetes Son   . Heart disease Son   . Cancer Son        prostate  . Cancer Sister        kidney cancer  . Diabetes Sister   . Cancer Daughter     Social History Social History   Tobacco Use  . Smoking status: Never Smoker  . Smokeless tobacco: Never Used  Substance Use Topics  . Alcohol use: No    Alcohol/week: 0.0 standard drinks  . Drug use: No    Review of Systems  All other systems negative except as documented in the HPI. All pertinent positives and negatives as reviewed in the HPI. ____________________________________________   PHYSICAL EXAM:  VITAL SIGNS: ED Triage Vitals  Enc Vitals Group     BP 02/27/18 1655 (!) 168/73     Pulse Rate 02/27/18 1655 64     Resp 02/27/18 1655 18     Temp 02/27/18 1655 97.8 F (36.6 C)     Temp Source 02/27/18 1655 Oral     SpO2 02/27/18 1655 98 %     Weight 02/27/18 1654 184 lb 15.5 oz (83.9 kg)     Height 02/27/18 1654 5\' 10"  (1.778 m)    Constitutional: Alert and oriented. Well appearing and in no acute distress. Eyes: Conjunctivae are normal. PERRL. EOMI. Head: Atraumatic. Nose: No congestion/rhinnorhea. Mouth/Throat: Mucous membranes are moist.  Oropharynx non-erythematous. Neck: No stridor.  No meningeal signs.   Cardiovascular: Normal rate, regular rhythm. Good peripheral circulation. Grossly normal  heart sounds.   Respiratory: tachypneic respiratory effort.  No retractions. Lungs mild rales in bases. Gastrointestinal: Soft and nontender. No distention.  Musculoskeletal: No lower extremity tenderness nor edema. No gross deformities of extremities. Neurologic:  Normal speech and language. No gross focal neurologic deficits are appreciated.  Skin:  Skin is warm, dry and intact. No rash noted.  ____________________________________________   LABS (all labs ordered are listed, but only abnormal results are displayed)  Labs Reviewed  CBC WITH DIFFERENTIAL/PLATELET - Abnormal; Notable for the following components:      Result Value   WBC 11.3 (*)    RBC 3.13 (*)    Hemoglobin 9.1 (*)  HCT 27.0 (*)    Neutro Abs 8.5 (*)    All other components within normal limits  COMPREHENSIVE METABOLIC PANEL - Abnormal; Notable for the following components:   Sodium 122 (*)    Chloride 89 (*)    Glucose, Bld 150 (*)    Calcium 8.8 (*)    Total Protein 6.4 (*)    Total Bilirubin 1.8 (*)    All other components within normal limits  LACTIC ACID, PLASMA - Abnormal; Notable for the following components:   Lactic Acid, Venous 2.7 (*)    All other components within normal limits  LACTIC ACID, PLASMA - Abnormal; Notable for the following components:   Lactic Acid, Venous 2.1 (*)    All other components within normal limits  URINALYSIS, ROUTINE W REFLEX MICROSCOPIC - Abnormal; Notable for the following components:   Color, Urine AMBER (*)    APPearance CLOUDY (*)    Hgb urine dipstick LARGE (*)    Protein, ur 100 (*)    Leukocytes, UA LARGE (*)    RBC / HPF >50 (*)    WBC, UA >50 (*)    Bacteria, UA RARE (*)    All other components within normal limits  BRAIN NATRIURETIC PEPTIDE - Abnormal; Notable for the following components:   B Natriuretic Peptide >4,500.0 (*)    All other components within normal limits  TROPONIN I - Abnormal; Notable for the following components:   Troponin I 0.04 (*)     All other components within normal limits  BASIC METABOLIC PANEL - Abnormal; Notable for the following components:   Sodium 123 (*)    Potassium 3.2 (*)    Chloride 88 (*)    Glucose, Bld 141 (*)    Calcium 8.5 (*)    All other components within normal limits  HEMOGLOBIN A1C - Abnormal; Notable for the following components:   Hgb A1c MFr Bld 6.7 (*)    All other components within normal limits  TROPONIN I - Abnormal; Notable for the following components:   Troponin I 0.04 (*)    All other components within normal limits  TROPONIN I - Abnormal; Notable for the following components:   Troponin I 0.04 (*)    All other components within normal limits  TROPONIN I - Abnormal; Notable for the following components:   Troponin I 0.03 (*)    All other components within normal limits  CBC - Abnormal; Notable for the following components:   RBC 2.99 (*)    Hemoglobin 8.7 (*)    HCT 25.6 (*)    All other components within normal limits  GLUCOSE, CAPILLARY - Abnormal; Notable for the following components:   Glucose-Capillary 167 (*)    All other components within normal limits  GLUCOSE, CAPILLARY - Abnormal; Notable for the following components:   Glucose-Capillary 148 (*)    All other components within normal limits  GLUCOSE, CAPILLARY - Abnormal; Notable for the following components:   Glucose-Capillary 215 (*)    All other components within normal limits  GLUCOSE, CAPILLARY - Abnormal; Notable for the following components:   Glucose-Capillary 143 (*)    All other components within normal limits  CULTURE, BLOOD (ROUTINE X 2)  CULTURE, BLOOD (ROUTINE X 2)  URINE CULTURE  TYPE AND SCREEN   ____________________________________________  EKG   EKG Interpretation  Date/Time:  Thursday February 27 2018 19:51:37 EDT Ventricular Rate:  65 PR Interval:    QRS Duration: 215 QT Interval:  533 QTC Calculation:  555 R Axis:   -72 Text Interpretation:  Atrial-ventricular dual-paced  rhythm No further analysis attempted due to paced rhythm No significant change since last tracing Confirmed by Marily Memos [phone]) on 02/28/2018 6:07:39 PM      ____________________________________________  RADIOLOGY  Dg Chest 2 View  Result Date: 02/27/2018 CLINICAL DATA:  Multiple recent falls. EXAM: CHEST - 2 VIEW COMPARISON:  Chest radiograph 02/23/2018 FINDINGS: Left chest wall 2 lead pacemaker with unchanged position of leads. There is moderate cardiomegaly with small bilateral pleural effusions and associated atelectasis. No focal airspace consolidation or pulmonary edema. No fracture visualized. IMPRESSION: Moderate cardiomegaly and small pleural effusions with associated atelectasis. Electronically Signed   By: Deatra Robinson M.D.   On: 02/27/2018 18:53   Dg Pelvis 1-2 Views  Result Date: 02/27/2018 CLINICAL DATA:  Multiple falls, evaluate for fracture. EXAM: PELVIS - 1-2 VIEW COMPARISON:  10/15/2016 FINDINGS: Moderate-to-marked degenerative disc disease and endplate spurring of the included lumbar spine from L3 through S1. The bony pelvis appears intact. There is mild osteoarthritic change of the right SI joint with sclerosis. No pelvic diastasis or fracture. Mild enthesopathy off the anterior superior iliac spines, left greater than right. Joint space narrowing of both hips without fracture or suspicious osseous lesions. IMPRESSION: Lumbar spondylosis. No acute pelvic or hip fracture is identified. If the patient has pain out of proportion to radiographic findings, CT may help identify radiographically occult fractures. Electronically Signed   By: Tollie Eth M.D.   On: 02/27/2018 18:53    ____________________________________________   PROCEDURES  Procedure(s) performed:   Procedures   ____________________________________________   INITIAL IMPRESSION / ASSESSMENT AND PLAN / ED COURSE  Will eval medically.  Ensure there is no indication for readmission to the hospital but at  this work-up is unremarkable them case management social work will need to get involved to help get the patient appropriate discharge plan.  On further work-up patient is found to have worsening heart failure and likely urinary tract infection as well.  These are likely contributing to her symptoms.  Her sodium is also a little bit low but is probably more related to the heart failure.  She is Artie gotten a little bit of fluid for concern of possible dehydration along with urinary tract infection so probably need some diuresis from that standpoint.  Lactic acid improved from 2.7-2.1 without fluid.  Low suspicion for septic shock at this time.  Discussed with hospitalist who will admit for same.   Pertinent labs & imaging results that were available during my care of the patient were reviewed by me and considered in my medical decision making (see chart for details).  ____________________________________________  FINAL CLINICAL IMPRESSION(S) / ED DIAGNOSES  Final diagnoses:  Weakness  Acute cystitis without hematuria  Acute on chronic heart failure, unspecified heart failure type (HCC)     MEDICATIONS GIVEN DURING THIS VISIT:  Medications  ALPRAZolam (XANAX) tablet 0.25 mg (0.25 mg Oral Given 02/27/18 2139)  acetaminophen (TYLENOL) tablet 650 mg (has no administration in time range)  aspirin EC tablet 81 mg (81 mg Oral Given 02/28/18 1043)  metoprolol succinate (TOPROL-XL) 24 hr tablet 25 mg (25 mg Oral Given 02/28/18 1042)  sertraline (ZOLOFT) tablet 50 mg (50 mg Oral Given 02/28/18 1042)  pravastatin (PRAVACHOL) tablet 80 mg (80 mg Oral Given 02/28/18 1651)  levothyroxine (SYNTHROID, LEVOTHROID) tablet 200 mcg (200 mcg Oral Given 02/28/18 1041)  gabapentin (NEURONTIN) capsule 300 mg (300 mg Oral Given 02/28/18 1652)  mometasone-formoterol (DULERA)  100-5 MCG/ACT inhaler 2 puff (2 puffs Inhalation Not Given 02/28/18 1338)  albuterol (PROVENTIL) (2.5 MG/3ML) 0.083% nebulizer solution 3 mL (has  no administration in time range)  enoxaparin (LOVENOX) injection 40 mg (40 mg Subcutaneous Given 02/28/18 1040)  sodium chloride flush (NS) 0.9 % injection 3 mL (3 mLs Intravenous Given 02/28/18 1044)  sodium chloride flush (NS) 0.9 % injection 3 mL (has no administration in time range)  0.9 %  sodium chloride infusion (has no administration in time range)  ondansetron (ZOFRAN) tablet 4 mg (has no administration in time range)    Or  ondansetron (ZOFRAN) injection 4 mg (has no administration in time range)  acetaminophen (TYLENOL) tablet 650 mg (650 mg Oral Given 02/28/18 1706)    Or  acetaminophen (TYLENOL) suppository 650 mg ( Rectal See Alternative 02/28/18 1706)  insulin aspart (novoLOG) injection 0-9 Units (1 Units Subcutaneous Given 02/28/18 1651)  furosemide (LASIX) tablet 40 mg (40 mg Oral Given 02/28/18 1652)  lisinopril (PRINIVIL,ZESTRIL) tablet 10 mg (10 mg Oral Given 02/28/18 1652)  tamsulosin (FLOMAX) capsule 0.4 mg (0.4 mg Oral Given 02/28/18 1653)  lactated ringers bolus 1,000 mL (0 mLs Intravenous Stopped 02/27/18 2222)  ciprofloxacin (CIPRO) IVPB 400 mg (400 mg Intravenous New Bag/Given 02/27/18 2145)  Influenza vac split quadrivalent PF (FLUZONE HIGH-DOSE) injection 0.5 mL (0.5 mLs Intramuscular Given 02/28/18 1044)  hydrALAZINE (APRESOLINE) tablet 25 mg (25 mg Oral Given 02/28/18 0617)     NEW OUTPATIENT MEDICATIONS STARTED DURING THIS VISIT:  Current Discharge Medication List      Note:  This note was prepared with assistance of Dragon voice recognition software. Occasional wrong-word or sound-a-like substitutions may have occurred due to the inherent limitations of voice recognition software.  Marily Memos, MD 02/28/18 1807

## 2018-02-28 DIAGNOSIS — E1143 Type 2 diabetes mellitus with diabetic autonomic (poly)neuropathy: Secondary | ICD-10-CM | POA: Diagnosis not present

## 2018-02-28 DIAGNOSIS — Z7189 Other specified counseling: Secondary | ICD-10-CM | POA: Diagnosis not present

## 2018-02-28 DIAGNOSIS — I2510 Atherosclerotic heart disease of native coronary artery without angina pectoris: Secondary | ICD-10-CM | POA: Diagnosis not present

## 2018-02-28 DIAGNOSIS — K589 Irritable bowel syndrome without diarrhea: Secondary | ICD-10-CM | POA: Diagnosis present

## 2018-02-28 DIAGNOSIS — I5042 Chronic combined systolic (congestive) and diastolic (congestive) heart failure: Secondary | ICD-10-CM | POA: Diagnosis not present

## 2018-02-28 DIAGNOSIS — N3000 Acute cystitis without hematuria: Secondary | ICD-10-CM | POA: Diagnosis not present

## 2018-02-28 DIAGNOSIS — K5790 Diverticulosis of intestine, part unspecified, without perforation or abscess without bleeding: Secondary | ICD-10-CM | POA: Diagnosis present

## 2018-02-28 DIAGNOSIS — Z833 Family history of diabetes mellitus: Secondary | ICD-10-CM | POA: Diagnosis not present

## 2018-02-28 DIAGNOSIS — R69 Illness, unspecified: Secondary | ICD-10-CM | POA: Diagnosis not present

## 2018-02-28 DIAGNOSIS — R5381 Other malaise: Secondary | ICD-10-CM | POA: Diagnosis not present

## 2018-02-28 DIAGNOSIS — H9190 Unspecified hearing loss, unspecified ear: Secondary | ICD-10-CM | POA: Diagnosis present

## 2018-02-28 DIAGNOSIS — E871 Hypo-osmolality and hyponatremia: Secondary | ICD-10-CM | POA: Diagnosis not present

## 2018-02-28 DIAGNOSIS — N390 Urinary tract infection, site not specified: Secondary | ICD-10-CM | POA: Diagnosis not present

## 2018-02-28 DIAGNOSIS — I248 Other forms of acute ischemic heart disease: Secondary | ICD-10-CM | POA: Diagnosis not present

## 2018-02-28 DIAGNOSIS — Z950 Presence of cardiac pacemaker: Secondary | ICD-10-CM | POA: Diagnosis not present

## 2018-02-28 DIAGNOSIS — R296 Repeated falls: Secondary | ICD-10-CM | POA: Diagnosis not present

## 2018-02-28 DIAGNOSIS — Z515 Encounter for palliative care: Secondary | ICD-10-CM | POA: Diagnosis not present

## 2018-02-28 DIAGNOSIS — J449 Chronic obstructive pulmonary disease, unspecified: Secondary | ICD-10-CM | POA: Diagnosis not present

## 2018-02-28 DIAGNOSIS — K219 Gastro-esophageal reflux disease without esophagitis: Secondary | ICD-10-CM | POA: Diagnosis present

## 2018-02-28 DIAGNOSIS — I509 Heart failure, unspecified: Secondary | ICD-10-CM | POA: Diagnosis not present

## 2018-02-28 DIAGNOSIS — K449 Diaphragmatic hernia without obstruction or gangrene: Secondary | ICD-10-CM | POA: Diagnosis present

## 2018-02-28 DIAGNOSIS — E538 Deficiency of other specified B group vitamins: Secondary | ICD-10-CM | POA: Diagnosis present

## 2018-02-28 DIAGNOSIS — Z9189 Other specified personal risk factors, not elsewhere classified: Secondary | ICD-10-CM | POA: Diagnosis not present

## 2018-02-28 DIAGNOSIS — Z743 Need for continuous supervision: Secondary | ICD-10-CM | POA: Diagnosis not present

## 2018-02-28 DIAGNOSIS — I5033 Acute on chronic diastolic (congestive) heart failure: Secondary | ICD-10-CM | POA: Diagnosis not present

## 2018-02-28 DIAGNOSIS — E1142 Type 2 diabetes mellitus with diabetic polyneuropathy: Secondary | ICD-10-CM | POA: Diagnosis present

## 2018-02-28 DIAGNOSIS — J90 Pleural effusion, not elsewhere classified: Secondary | ICD-10-CM | POA: Diagnosis not present

## 2018-02-28 DIAGNOSIS — E785 Hyperlipidemia, unspecified: Secondary | ICD-10-CM | POA: Diagnosis present

## 2018-02-28 DIAGNOSIS — Z825 Family history of asthma and other chronic lower respiratory diseases: Secondary | ICD-10-CM | POA: Diagnosis not present

## 2018-02-28 DIAGNOSIS — R748 Abnormal levels of other serum enzymes: Secondary | ICD-10-CM | POA: Diagnosis not present

## 2018-02-28 DIAGNOSIS — I495 Sick sinus syndrome: Secondary | ICD-10-CM | POA: Diagnosis not present

## 2018-02-28 DIAGNOSIS — G4700 Insomnia, unspecified: Secondary | ICD-10-CM | POA: Diagnosis not present

## 2018-02-28 DIAGNOSIS — I10 Essential (primary) hypertension: Secondary | ICD-10-CM | POA: Diagnosis not present

## 2018-02-28 DIAGNOSIS — I110 Hypertensive heart disease with heart failure: Secondary | ICD-10-CM | POA: Diagnosis not present

## 2018-02-28 DIAGNOSIS — R531 Weakness: Secondary | ICD-10-CM | POA: Diagnosis not present

## 2018-02-28 DIAGNOSIS — M6281 Muscle weakness (generalized): Secondary | ICD-10-CM | POA: Diagnosis not present

## 2018-02-28 DIAGNOSIS — Z23 Encounter for immunization: Secondary | ICD-10-CM | POA: Diagnosis not present

## 2018-02-28 DIAGNOSIS — G4730 Sleep apnea, unspecified: Secondary | ICD-10-CM | POA: Diagnosis present

## 2018-02-28 DIAGNOSIS — E1169 Type 2 diabetes mellitus with other specified complication: Secondary | ICD-10-CM | POA: Diagnosis present

## 2018-02-28 DIAGNOSIS — I5043 Acute on chronic combined systolic (congestive) and diastolic (congestive) heart failure: Secondary | ICD-10-CM | POA: Diagnosis not present

## 2018-02-28 DIAGNOSIS — I152 Hypertension secondary to endocrine disorders: Secondary | ICD-10-CM | POA: Diagnosis present

## 2018-02-28 DIAGNOSIS — I679 Cerebrovascular disease, unspecified: Secondary | ICD-10-CM | POA: Diagnosis not present

## 2018-02-28 LAB — GLUCOSE, CAPILLARY
GLUCOSE-CAPILLARY: 148 mg/dL — AB (ref 70–99)
GLUCOSE-CAPILLARY: 215 mg/dL — AB (ref 70–99)
Glucose-Capillary: 143 mg/dL — ABNORMAL HIGH (ref 70–99)
Glucose-Capillary: 113 mg/dL — ABNORMAL HIGH (ref 70–99)

## 2018-02-28 LAB — CBC
HCT: 25.6 % — ABNORMAL LOW (ref 36.0–46.0)
Hemoglobin: 8.7 g/dL — ABNORMAL LOW (ref 12.0–15.0)
MCH: 29.1 pg (ref 26.0–34.0)
MCHC: 34 g/dL (ref 30.0–36.0)
MCV: 85.6 fL (ref 78.0–100.0)
PLATELETS: 234 10*3/uL (ref 150–400)
RBC: 2.99 MIL/uL — ABNORMAL LOW (ref 3.87–5.11)
RDW: 14.9 % (ref 11.5–15.5)
WBC: 9.3 10*3/uL (ref 4.0–10.5)

## 2018-02-28 LAB — HEMOGLOBIN A1C
HEMOGLOBIN A1C: 6.7 % — AB (ref 4.8–5.6)
Mean Plasma Glucose: 145.59 mg/dL

## 2018-02-28 LAB — BASIC METABOLIC PANEL
Anion gap: 9 (ref 5–15)
BUN: 15 mg/dL (ref 8–23)
CHLORIDE: 88 mmol/L — AB (ref 98–111)
CO2: 26 mmol/L (ref 22–32)
Calcium: 8.5 mg/dL — ABNORMAL LOW (ref 8.9–10.3)
Creatinine, Ser: 0.73 mg/dL (ref 0.44–1.00)
GFR calc Af Amer: >60 mL/min (ref >60)
GLUCOSE: 141 mg/dL — AB (ref 70–99)
POTASSIUM: 3.2 mmol/L — AB (ref 3.5–5.1)
Sodium: 123 mmol/L — ABNORMAL LOW (ref 135–145)

## 2018-02-28 LAB — TROPONIN I
TROPONIN I: 0.04 ng/mL — AB (ref <0.03)
TROPONIN I: 0.03 ng/mL — AB (ref <0.03)
Troponin I: 0.04 ng/mL (ref <0.03)

## 2018-02-28 MED ORDER — TAMSULOSIN HCL 0.4 MG PO CAPS
0.4000 mg | ORAL_CAPSULE | Freq: Every day | ORAL | Status: DC
  Administered 2018-02-28 – 2018-03-05 (×6): 0.4 mg via ORAL
  Filled 2018-02-28 (×6): qty 1

## 2018-02-28 MED ORDER — FUROSEMIDE 40 MG PO TABS
40.0000 mg | ORAL_TABLET | Freq: Every day | ORAL | Status: DC
  Administered 2018-02-28 – 2018-03-06 (×7): 40 mg via ORAL
  Filled 2018-02-28 (×7): qty 1

## 2018-02-28 MED ORDER — LISINOPRIL 10 MG PO TABS
10.0000 mg | ORAL_TABLET | Freq: Every day | ORAL | Status: DC
  Administered 2018-02-28 – 2018-03-06 (×7): 10 mg via ORAL
  Filled 2018-02-28 (×7): qty 1

## 2018-02-28 MED ORDER — HYDRALAZINE HCL 25 MG PO TABS
25.0000 mg | ORAL_TABLET | Freq: Once | ORAL | Status: AC
  Administered 2018-02-28: 25 mg via ORAL
  Filled 2018-02-28: qty 1

## 2018-02-28 NOTE — Clinical Social Work Note (Signed)
Annabelle HarmanDana stated that she started [employer].     Munirah Doerner, Juleen ChinaHeather D, LCSW

## 2018-02-28 NOTE — Plan of Care (Signed)
  Problem: Acute Rehab PT Goals(only PT should resolve) Goal: Pt Will Go Supine/Side To Sit Outcome: Progressing Flowsheets (Taken 02/28/2018 1023) Pt will go Supine/Side to Sit: with minimal assist Goal: Patient Will Transfer Sit To/From Stand Outcome: Progressing Flowsheets (Taken 02/28/2018 1023) Patient will transfer sit to/from stand: with minimal assist Goal: Pt Will Transfer Bed To Chair/Chair To Bed Outcome: Progressing Flowsheets (Taken 02/28/2018 1023) Pt will Transfer Bed to Chair/Chair to Bed: with min assist Goal: Pt Will Ambulate Outcome: Progressing Flowsheets (Taken 02/28/2018 1023) Pt will Ambulate: 25 feet; with minimal assist; with standard walker   10:24 AM, 02/28/18 Ocie BobJames Taylyn Brame, MPT Physical Therapist with [facility] [phone] office [phone] mobile phone

## 2018-02-28 NOTE — Clinical Social Work Note (Signed)
Received CSW consult for placement. Pt is known to CSW from admission earlier in the week. CSW assessment from that admission copied below. At the time of that admission, PT had recommended SNF rehab and pt refused. LCSW, Tretha Sciara, spoke with pt's daughters [name] [name] and [name] [name] at that time and they reinforced that pt would go home with [name] [name] and she would take care of pt.   Pt now presents to ED after falling at home and family now saying they cannot take care of her. This LCSW spoke with pt's daughter, [name] [name], by phone today. Per [name] [name], she is leaving today to go out of town for ten days and no other family members can provide care. Per [name] [name] will be the contact for the next ten days.  PT has re-assessed and is recommending SNF rehab again. [name] [name] requests referral to [facility] [place] and [facility]. Referrals started. Pt will need insurance authorization before she can dc to a SNF rehab. CSW will follow.    Clinical Social Work Assessment  Patient Details  Name: [name] [name] MRN: [id number] Date of Birth: 1933/05/21  Date of referral:  02/25/18               Reason for consult:  Facility Placement                 Permission sought to share information with:    Permission granted to share information::                Name::                   Agency::                Relationship::                Contact Information:  dtr. Cardlia  Housing/Transportation Living arrangements for the past 2 months:  Single Family Home Source of Information:  Patient, Adult Children Patient Interpreter Needed:  None Criminal Activity/Legal Involvement Pertinent to Current Situation/Hospitalization:  No - Comment as needed Significant Relationships:  Adult Children Lives with:  Self Do you feel safe going back to the place where you live?  Yes Need for family participation in patient care:  Yes (Comment)  Care giving concerns:  None identified at baseline.   Social Worker  assessment / plan:  Patient states that she is going to go home with her daughter [name] [name]. She stated that she spoke with [name] [name] this morning in regards to the discharge plan.  She declined SNF.   Employment status:  Retired Database administrator PT Recommendations:  Radio producer, Skilled Nursing Facility Information / Referral to community resources:  Skilled Nursing Facility  Patient/Family's Response to care:  Patient declined SNF. Patient has made a plan with daughter, [name] [name], to go home with her at discharge.   Patient/Family's Understanding of and Emotional Response to Diagnosis, Current Treatment, and Prognosis:  Patient understands her diagnosis, treatment and prognosis.   Emotional Assessment Appearance:  Appears stated age Attitude/Demeanor/Rapport:    Affect (typically observed):  Accepting, Calm Orientation:  Oriented to Self, Oriented to Place, Oriented to Situation Alcohol / Substance use:  Not Applicable Psych involvement (Current and /or in the community):  No (Comment)  Discharge Needs  Concerns to be addressed:  Discharge Planning Concerns Readmission within the last 30 days:  Yes Current discharge risk:  None Barriers to Discharge:  No Barriers Identified   Settle, [name] China, LCSW  02/25/2018, 9:12 AM

## 2018-02-28 NOTE — Evaluation (Signed)
Physical Therapy Evaluation Patient Details Name: [name] [name] MRN: [id number] DOB: 10/04/1932 Today's Date: 02/28/2018   History of Present Illness  [name] [name]  is a 84 y.o. female, with history of CAD, CVA, chronic diastolic and systolic CHF last EF 35 to 40% as per echo from January 2019, status post pacemaker placement, diabetes mellitus type 2, hypertension who was recently discharged from the hospital on 02/25/2018 today was brought to the hospital for worsening shortness of breath on exertion, multiple falls at home.    Clinical Impression  Patient demonstrates slow labored movement for sitting up at bedside, very unsteady on feet with near loss of balance, limited to a few steps to transfer to chair secondary to weakness, poor standing balance and c/o fatigue.  Patient tolerated sitting up in chair after therapy.  Patient will benefit from continued physical therapy in hospital and recommended venue below to increase strength, balance, endurance for safe ADLs and gait.    Follow Up Recommendations SNF    Equipment Recommendations  None recommended by PT    Recommendations for Other Services       Precautions / Restrictions Precautions Precautions: Fall Restrictions Weight Bearing Restrictions: No      Mobility  Bed Mobility Overal bed mobility: Needs Assistance Bed Mobility: Supine to Sit     Supine to sit: Mod assist;Min assist     General bed mobility comments: slow labored movement  Transfers Overall transfer level: Needs assistance Equipment used: Rolling walker (2 wheeled) Transfers: Sit to/from [employer] Sit to Stand: Mod assist Stand pivot transfers: Mod assist       General transfer comment: slow labored movement  Ambulation/Gait Ambulation/Gait assistance: Mod assist Gait Distance (Feet): 4 Feet Assistive device: Rolling walker (2 wheeled) Gait Pattern/deviations: Decreased step length - right;Decreased step length -  left;Decreased stride length Gait velocity: slow   General Gait Details: limited to 5-6 slow unsteady labored steps at bedside due to fatigue and generalized weakness  Stairs            Wheelchair Mobility    Modified Rankin (Stroke Patients Only)       Balance Overall balance assessment: Needs assistance Sitting-balance support: Feet supported;No upper extremity supported Sitting balance-Leahy Scale: Fair     Standing balance support: Bilateral upper extremity supported;During functional activity Standing balance-Leahy Scale: Poor Standing balance comment: fair/poor with RW                             Pertinent Vitals/Pain Pain Assessment: 0-10 Pain Score: 5  Pain Location: 5/10 left side of low back Pain Descriptors / Indicators: Aching Pain Intervention(s): Limited activity within patient's tolerance;Monitored during session    Home Living Family/patient expects to be discharged to:: Private residence(Will be staying with her daughter) Living Arrangements: Children Available Help at Discharge: Family Type of Home: House Home Access: Stairs to enter Entrance Stairs-Rails: Doctor, general practice of Steps: 3 Home Layout: Two level;Able to live on main level with bedroom/bathroom Home Equipment: Bedside commode;Shower seat;Walker - 2 wheels;Wheelchair - manual Additional Comments: Patient unable to stay at her home due to bedbug infestation, will be staying with her daughter when discharged from SNF    Prior Function Level of Independence: Needs assistance   Gait / Transfers Assistance Needed: household ambulator with RW  ADL's / Homemaking Assistance Needed: family assist with community ADLs        Hand Dominance  Extremity/Trunk Assessment   Upper Extremity Assessment Upper Extremity Assessment: Generalized weakness    Lower Extremity Assessment Lower Extremity Assessment: Generalized weakness    Cervical / Trunk  Assessment Cervical / Trunk Assessment: Kyphotic  Communication   Communication: No difficulties  Cognition Arousal/Alertness: Awake/alert Behavior During Therapy: WFL for tasks assessed/performed Overall Cognitive Status: Within Functional Limits for tasks assessed                                        General Comments      Exercises     Assessment/Plan    PT Assessment Patient needs continued PT services  PT Problem List Decreased strength;Decreased activity tolerance;Decreased balance;Decreased mobility       PT Treatment Interventions Gait training;Stair training;Functional mobility training;Therapeutic activities;Therapeutic exercise;Patient/family education    PT Goals (Current goals can be found in the Care Plan section)  Acute Rehab PT Goals Patient Stated Goal: return home with family after rehab PT Goal Formulation: With patient Time For Goal Achievement: 03/14/18 Potential to Achieve Goals: Good    Frequency Min 3X/week   Barriers to discharge        Co-evaluation               AM-PAC PT "6 Clicks" Daily Activity  Outcome Measure Difficulty turning over in bed (including adjusting bedclothes, sheets and blankets)?: A Little Difficulty moving from lying on back to sitting on the side of the bed? : A Lot Difficulty sitting down on and standing up from a chair with arms (e.g., wheelchair, bedside commode, etc,.)?: A Lot Help needed moving to and from a bed to chair (including a wheelchair)?: A Lot Help needed walking in hospital room?: A Lot Help needed climbing 3-5 steps with a railing? : A Lot 6 Click Score: 13    End of Session Equipment Utilized During Treatment: Gait belt Activity Tolerance: Patient limited by fatigue Patient left: in chair;with call bell/phone within reach;with chair alarm set Nurse Communication: Mobility status PT Visit Diagnosis: Unsteadiness on feet (R26.81);Other abnormalities of gait and mobility  (R26.89);Muscle weakness (generalized) (M62.81)    Time: [id number] PT Time Calculation (min) (ACUTE ONLY): 26 min   Charges:   PT Evaluation $PT Eval Moderate Complexity: 1 Mod PT Treatments $Therapeutic Activity: 23-37 mins        10:21 AM, 02/28/18 Ocie BobJames Leilynn Pilat, MPT Physical Therapist with [facility] 336 [phone] office [phone] mobile phone

## 2018-02-28 NOTE — Care Management Important Message (Signed)
Important Message  Patient Details  Name: [name] M Fraley MRN: [id number] Date of Birth: [birth year]   Medicare Important Message Given:  Yes    Renie OraHawkins, Renad Jenniges Smith 02/28/2018, 10:33 AM

## 2018-02-28 NOTE — Care Management Obs Status (Signed)
MEDICARE OBSERVATION STATUS NOTIFICATION   Patient Details  Name: [name] [name] MRN: [id number] Date of Birth: 20-Sep-1932   Medicare Observation Status Notification Given:  Yes    Malcolm MetroChildress, Querida Beretta Demske, RN 02/28/2018, 3:09 PM

## 2018-02-28 NOTE — Progress Notes (Addendum)
Patient seen and examined, database reviewed.  She is lying in bed attempting to read a piece of paper, asks me for some reading glasses, she is oriented and does not appear to be in distress.   Vitals:   02/27/18 2317 02/28/18 0535  BP: (!) 166/69 (!) 187/69  Pulse: 60 61  Resp:    Temp: 97.9 F (36.6 C) 98.2 F (36.8 C)  SpO2: 95% 96%     No family members at bedside.  After carefully reviewing patient's presentation and examining patient, I have come to the conclusion that there truly is no medical reason for this admission.  She does not have acute heart failure.  She did suffer a fall at home but no injuries are apparent.  Her UA has some RBCs and WBCs but rare bacteria.  She also does not have symptoms of a UTI.  Will discontinue Cipro.  She was just discharged from this facility on 9/17.  At that point SNF recommendations were given by physical therapy.  Social work offered patient SNF, daughter insisted that they would take her home and they had enough social support for her.  Apparently on day of admission patient went to visit her PCP who then referred her to the hospital for placement issues only.  I am not clear as to why she was admitted to the hospital as she should not have qualified for admission for placement purposes only.  This MD has called patient's daughter [name] via phone.  She has been very argumentative stating that she is now driving to [place] for a preplanned vacation.  She will not be available to discuss her mother's current situation.  She states that there is nobody to stay with her 24/7 at home. She now approves SNF. Have discussed care with case management and social work.  They will attempt SNF placement, however due to weekend approaching she will probably need to stay in the hospital over the weekend.  Peggye PittEstela Hernandez, MD [facility] Pager: [phone]

## 2018-02-28 NOTE — Care Management CC44 (Signed)
Condition Code 44 Documentation Completed  Patient Details  Name: [name] M Caffee MRN: [id number] Date of Birth: 11/24/1932   Condition Code 44 given:  Yes Patient signature on Condition Code 44 notice:  Yes Documentation of 2 MD's agreement:  Yes Code 44 added to claim:  Yes    Malcolm Metrohildress, Skylier Kretschmer Demske, RN 02/28/2018, 3:09 PM

## 2018-02-28 NOTE — Care Management (Signed)
CM received call afternoon on 02/27/18 from ED RN stating pt had been brought to ED for placement. She was accompanied by [facility] Paramed[facility] rep, Elana Walker. Pt was discharged earlier this week, home with daughter despite recommendations for SNF. There was no mention at this time of daughter inability to physically care for patient OR any family going on vacation. SNF is possible from home but may take 2-3 days.  Pt has applied for medicaid in the past but has never followed through per Elana. CSW to see pt today for placement.

## 2018-02-28 NOTE — NC FL2 (Signed)
[place] MEDICAID FL2 LEVEL OF CARE SCREENING TOOL     IDENTIFICATION  Patient Name: [name] [name] Birthdate: Oct 30, 1932 Sex: female Admission Date (Current Location): 02/27/2018  [facility] and [place] Number:  [employer] and Address:  [facility],  [address] [place] [address]      Provider Number: [id number]  Attending Physician Name and Address:  Philip Aspen, Minerva Ends*  Relative Name and Phone Number:  Alphia Kava [phone]    Current Level of Care: Hospital Recommended Level of Care: Skilled Nursing Facility Prior Approval Number:    Date Approved/Denied:   PASRR Number:    Discharge Plan: SNF    Current Diagnoses: Patient Active Problem List   Diagnosis Date Noted  . CHF exacerbation (HCC) 02/27/2018  . Fall at home, initial encounter 02/23/2018  . Fall 02/23/2018  . Aortic atherosclerosis (HCC) 10/15/2017  . Acute on chronic combined systolic and diastolic CHF (congestive heart failure) (HCC) 07/02/2017  . Pleural effusion   . Palliative care encounter   . Goals of care, counseling/discussion   . Pressure injury of skin 06/29/2017  . Elevated troponin 06/28/2017  . Normocytic anemia 06/28/2017  . Sleep apnea 06/28/2017  . Tinea cruris 06/28/2017  . Hyperlipidemia associated with type 2 diabetes mellitus (HCC) 05/14/2017  . Chronic back pain 01/28/2017  . PVD (peripheral vascular disease) (HCC) 01/27/2016  . Skin moniliasis 05/31/2014  . OAB (overactive bladder) 12/23/2012  . Insomnia 04/06/2011  . Sinoatrial node dysfunction (HCC)   . Gastroesophageal reflux disease   . Type II diabetes mellitus with peripheral autonomic neuropathy (HCC)   . GAD (generalized anxiety disorder) 12/30/2009  . Diabetic neuropathy (HCC) 12/26/2009  . Vitamin B 12 deficiency 10/11/2009  . Morbid obesity (HCC) 09/16/2009  . Arteriosclerotic cardiovascular disease (ASCVD) 09/15/2009  . CEREBROVASCULAR DISEASE 09/15/2009  . Hypertension  associated with diabetes (HCC) 08/29/2009  . Osteoarthrosis involving lower leg 08/29/2009  . COLONIC POLYPS, ADENOMATOUS, HX OF 02/22/2009  . Hypothyroid 02/17/2009  . Chronic obstructive pulmonary disease (HCC) 02/17/2009  . Chronic diastolic heart failure (HCC) 12/15/2008  . PPM-[employer] 12/15/2008    Orientation RESPIRATION BLADDER Height & Weight     Self, Situation, Place  Normal Continent Weight: 184 lb 15.5 oz (83.9 kg) Height:  5\' 10"  (177.8 cm)  BEHAVIORAL SYMPTOMS/MOOD NEUROLOGICAL BOWEL NUTRITION STATUS      Continent Diet(see dc summary)  AMBULATORY STATUS COMMUNICATION OF NEEDS Skin   Extensive Assist Verbally Normal                       Personal Care Assistance Level of Assistance  Bathing, Feeding, Dressing Bathing Assistance: Limited assistance Feeding assistance: Independent Dressing Assistance: Limited assistance     Functional Limitations Info  Sight, Hearing, Speech Sight Info: Adequate Hearing Info: Adequate Speech Info: Adequate    SPECIAL CARE FACTORS FREQUENCY  PT (By licensed PT)     PT Frequency: 5 times week              Contractures Contractures Info: Not present    Additional Factors Info  Code Status, Allergies Code Status Info: Full Allergies Info: Penicillins           Current Medications (02/28/2018):  This is the current hospital active medication list Current Facility-Administered Medications  Medication Dose Route Frequency Provider Last Rate Last Dose  . 0.9 %  sodium chloride infusion  250 mL Intravenous PRN Meredeth Ide, MD      .  acetaminophen (TYLENOL) tablet 650 mg  650 mg Oral Q6H PRN Meredeth IdeLama, Gagan S, MD   650 mg at 02/28/18 1056   Or  . acetaminophen (TYLENOL) suppository 650 mg  650 mg Rectal Q6H PRN Meredeth IdeLama, Gagan S, MD      . acetaminophen (TYLENOL) tablet 650 mg  650 mg Oral Q6H PRN Meredeth IdeLama, Gagan S, MD      . albuterol (PROVENTIL) (2.5 MG/3ML) 0.083% nebulizer solution 3 mL  3 mL Inhalation Q6H PRN Meredeth IdeLama,  Gagan S, MD      . ALPRAZolam Prudy Feeler(XANAX) tablet 0.25 mg  0.25 mg Oral QHS PRN Meredeth IdeLama, Gagan S, MD   0.25 mg at 02/27/18 2139  . aspirin EC tablet 81 mg  81 mg Oral Daily Meredeth IdeLama, Gagan S, MD   81 mg at 02/28/18 1043  . ciprofloxacin (CIPRO) IVPB 400 mg  400 mg Intravenous Q12H Meredeth IdeLama, Gagan S, MD 200 mL/hr at 02/28/18 1052 400 mg at 02/28/18 1052  . enoxaparin (LOVENOX) injection 40 mg  40 mg Subcutaneous Q24H Sharl MaLama, Gagan S, MD   40 mg at 02/28/18 1040  . furosemide (LASIX) injection 40 mg  40 mg Intravenous Q12H Meredeth IdeLama, Gagan S, MD   40 mg at 02/27/18 2355  . gabapentin (NEURONTIN) capsule 300 mg  300 mg Oral TID Meredeth IdeLama, Gagan S, MD   300 mg at 02/28/18 1043  . insulin aspart (novoLOG) injection 0-9 Units  0-9 Units Subcutaneous TID WC Meredeth IdeLama, Gagan S, MD   1 Units at 02/28/18 0800  . levothyroxine (SYNTHROID, LEVOTHROID) tablet 200 mcg  200 mcg Oral QAC breakfast Meredeth IdeLama, Gagan S, MD   200 mcg at 02/28/18 1041  . metoprolol succinate (TOPROL-XL) 24 hr tablet 25 mg  25 mg Oral Daily Meredeth IdeLama, Gagan S, MD   25 mg at 02/28/18 1042  . mometasone-formoterol (DULERA) 100-5 MCG/ACT inhaler 2 puff  2 puff Inhalation BID Meredeth IdeLama, Gagan S, MD      . ondansetron [facility](ZOFRAN) tablet 4 mg  4 mg Oral Q6H PRN Meredeth IdeLama, Gagan S, MD       Or  . ondansetron (ZOFRAN) injection 4 mg  4 mg Intravenous Q6H PRN Meredeth IdeLama, Gagan S, MD      . pravastatin (PRAVACHOL) tablet 80 mg  80 mg Oral q1800 Meredeth IdeLama, Gagan S, MD      . sertraline (ZOLOFT) tablet 50 mg  50 mg Oral Daily Meredeth IdeLama, Gagan S, MD   50 mg at 02/28/18 1042  . sodium chloride flush (NS) 0.9 % injection 3 mL  3 mL Intravenous Q12H Meredeth IdeLama, Gagan S, MD   3 mL at 02/28/18 1044  . sodium chloride flush (NS) 0.9 % injection 3 mL  3 mL Intravenous PRN Meredeth IdeLama, Gagan S, MD         Discharge Medications: Please see discharge summary for a list of discharge medications.  Relevant Imaging Results:  Relevant Lab Results:   Additional Information SSN: [id number]  Deanta Mincey, LCSW

## 2018-03-01 LAB — GLUCOSE, CAPILLARY
GLUCOSE-CAPILLARY: 135 mg/dL — AB (ref 70–99)
GLUCOSE-CAPILLARY: 192 mg/dL — AB (ref 70–99)
Glucose-Capillary: 207 mg/dL — ABNORMAL HIGH (ref 70–99)
Glucose-Capillary: 167 mg/dL — ABNORMAL HIGH (ref 70–99)

## 2018-03-01 NOTE — Progress Notes (Signed)
Patient seen and examined, database reviewed.  No family members at bedside.  Vitals:   03/01/18 0528 03/01/18 0733  BP: (!) 155/67   Pulse: (!) 58   Resp: 16   Temp: 97.9 F (36.6 C)   SpO2: 94% 92%   She is lying in bed, has no complaints.  She is waiting for placement, has no active medical issues.  Peggye PittEstela Hernandez, MD [facility] Pager: [phone]

## 2018-03-02 LAB — GLUCOSE, CAPILLARY
GLUCOSE-CAPILLARY: 158 mg/dL — AB (ref 70–99)
GLUCOSE-CAPILLARY: 174 mg/dL — AB (ref 70–99)
GLUCOSE-CAPILLARY: 174 mg/dL — AB (ref 70–99)
Glucose-Capillary: 124 mg/dL — ABNORMAL HIGH (ref 70–99)

## 2018-03-02 LAB — URINE CULTURE: Culture: >=100000 — AB

## 2018-03-02 MED ORDER — ALUM & MAG HYDROXIDE-SIMETH 200-200-20 MG/5ML PO SUSP
30.0000 mL | ORAL | Status: DC | PRN
  Administered 2018-03-02: 30 mL via ORAL
  Filled 2018-03-02: qty 30

## 2018-03-02 MED ORDER — POTASSIUM CHLORIDE CRYS ER 20 MEQ PO TBCR
40.0000 meq | EXTENDED_RELEASE_TABLET | Freq: Once | ORAL | Status: AC
  Administered 2018-03-02: 40 meq via ORAL
  Filled 2018-03-02: qty 2

## 2018-03-02 NOTE — Progress Notes (Addendum)
Patient seen and examined, database reviewed.  No family members at bedside.  Is in no distress.  States she is tired as she did not rest well last night.  Labs drawn on 9/20 with mild hypokalemia with a potassium of 3.2 as well as a hemoglobin of 8.7.  Will replace potassium today, will request labs in the morning to follow.  Vitals:   03/02/18 0642 03/02/18 0751  BP: (!) 146/47   Pulse: 62   Resp:    Temp: 98.3 F (36.8 C)   SpO2: 96% 97%   She is waiting for SNF placement, has no active medical issues.  Peggye PittEstela Hernandez, MD [facility] Pager: [phone]

## 2018-03-03 LAB — BASIC METABOLIC PANEL
ANION GAP: 9 (ref 5–15)
BUN: 16 mg/dL (ref 8–23)
CHLORIDE: 89 mmol/L — AB (ref 98–111)
CO2: 30 mmol/L (ref 22–32)
Calcium: 8.1 mg/dL — ABNORMAL LOW (ref 8.9–10.3)
Creatinine, Ser: 0.63 mg/dL (ref 0.44–1.00)
Glucose, Bld: 138 mg/dL — ABNORMAL HIGH (ref 70–99)
Potassium: 3.4 mmol/L — ABNORMAL LOW (ref 3.5–5.1)
SODIUM: 128 mmol/L — AB (ref 135–145)

## 2018-03-03 LAB — CBC
HCT: 24.3 % — ABNORMAL LOW (ref 36.0–46.0)
HEMOGLOBIN: 8 g/dL — AB (ref 12.0–15.0)
MCH: 28.9 pg (ref 26.0–34.0)
MCHC: 32.9 g/dL (ref 30.0–36.0)
MCV: 87.7 fL (ref 78.0–100.0)
Platelets: 229 10*3/uL (ref 150–400)
RBC: 2.77 MIL/uL — AB (ref 3.87–5.11)
RDW: 15.5 % (ref 11.5–15.5)
WBC: 5.8 10*3/uL (ref 4.0–10.5)

## 2018-03-03 LAB — GLUCOSE, CAPILLARY
GLUCOSE-CAPILLARY: 138 mg/dL — AB (ref 70–99)
GLUCOSE-CAPILLARY: 138 mg/dL — AB (ref 70–99)
Glucose-Capillary: 164 mg/dL — ABNORMAL HIGH (ref 70–99)
Glucose-Capillary: 142 mg/dL — ABNORMAL HIGH (ref 70–99)
Glucose-Capillary: 142 mg/dL — ABNORMAL HIGH (ref 70–99)

## 2018-03-03 MED ORDER — POTASSIUM CHLORIDE CRYS ER 20 MEQ PO TBCR
40.0000 meq | EXTENDED_RELEASE_TABLET | Freq: Once | ORAL | Status: AC
  Administered 2018-03-03: 40 meq via ORAL
  Filled 2018-03-03: qty 2

## 2018-03-03 NOTE — Care Management Important Message (Signed)
Important Message  Patient Details  Name: [name] [name] MRN: [id number] Date of Birth: Nov 25, 1932   Medicare Important Message Given:  Yes    Renie OraHawkins, Metzli Pollick Smith 03/03/2018, 11:25 AM

## 2018-03-03 NOTE — Care Management (Signed)
CM spoke with Elana Dan HumphreysWalker, [facility] program representative, their CSW will see pt to initiate Medicaid application. They are certain pt will qualify.

## 2018-03-03 NOTE — Progress Notes (Signed)
Physical Therapy Treatment Patient Details Name: [name] [name] MRN: [id number] DOB: [birth year] Today's Date: 03/03/2018    History of Present Illness [name] [name]  is a 84 y.o. female, with history of CAD, CVA, chronic diastolic and systolic CHF last EF 35 to 40% as per echo from January 2019, status post pacemaker placement, diabetes mellitus type 2, hypertension who was recently discharged from the hospital on 02/25/2018 today was brought to the hospital for worsening shortness of breath on exertion, multiple falls at home.    PT Comments    Patient presents up in chair (assisted by nursing staff) and agreeable for therapy.  Patient requires frequent rest breaks while completing exercises due to fatigue, had difficulty with sit to stands due to BLE weakness, demonstrated increased endurance/distance for gait training in hallway without loss of balance, and requested to go back to bed after therapy due to c/o fatigue after sitting up for breakfast.  Patient will benefit from continued physical therapy in hospital and recommended venue below to increase strength, balance, endurance for safe ADLs and gait.   Follow Up Recommendations  SNF     Equipment Recommendations  None recommended by PT    Recommendations for Other Services       Precautions / Restrictions Precautions Precautions: Fall Restrictions Weight Bearing Restrictions: No    Mobility  Bed Mobility Overal bed mobility: Needs Assistance Bed Mobility: Sit to Supine       Sit to supine: Min assist   General bed mobility comments: required assistance to move legs onto bed  Transfers Overall transfer level: Needs assistance Equipment used: Rolling walker (2 wheeled) Transfers: Sit to/from [employer];Stand Pivot Transfers Sit to Stand: Min assist;Mod assist Stand pivot transfers: Min assist       General transfer comment: required to attempts before maintaining standing balance during sit to  stands  Ambulation/Gait Ambulation/Gait assistance: Min assist Gait Distance (Feet): 35 Feet Assistive device: Rolling walker (2 wheeled) Gait Pattern/deviations: Decreased step length - right;Decreased step length - left;Decreased stride length Gait velocity: decreased   General Gait Details: increased endurance/distance demonstrating slow labored movement without loss of balance, limited secondary to c/o fatigue   Stairs             Wheelchair Mobility    Modified Rankin (Stroke Patients Only)       Balance Overall balance assessment: Needs assistance Sitting-balance support: Feet supported;No upper extremity supported Sitting balance-Leahy Scale: Fair     Standing balance support: Bilateral upper extremity supported;During functional activity Standing balance-Leahy Scale: Fair Standing balance comment: using RW                            Cognition Arousal/Alertness: Awake/alert Behavior During Therapy: WFL for tasks assessed/performed Overall Cognitive Status: Within Functional Limits for tasks assessed                                        Exercises General Exercises - Lower Extremity Long Arc Quad: Seated;AROM;Strengthening;Both;10 reps Hip Flexion/Marching: Seated;AROM;Strengthening;Both;10 reps Toe Raises: Seated;Strengthening;AROM;Both;10 reps Heel Raises: Seated;AROM;Strengthening;Both;10 reps    General Comments        Pertinent Vitals/Pain Pain Assessment: No/denies pain    Home Living                      Prior Function  PT Goals (current goals can now be found in the care plan section) Acute Rehab PT Goals Patient Stated Goal: return home with family after rehab PT Goal Formulation: With patient Time For Goal Achievement: 03/14/18 Potential to Achieve Goals: Good Progress towards PT goals: Progressing toward goals    Frequency    Min 3X/week      PT Plan Current plan remains  appropriate    Co-evaluation              AM-PAC PT "6 Clicks" Daily Activity  Outcome Measure  Difficulty turning over in bed (including adjusting bedclothes, sheets and blankets)?: A Little Difficulty moving from lying on back to sitting on the side of the bed? : A Lot Difficulty sitting down on and standing up from a chair with arms (e.g., wheelchair, bedside commode, etc,.)?: A Little Help needed moving to and from a bed to chair (including a wheelchair)?: A Little Help needed walking in hospital room?: A Lot Help needed climbing 3-5 steps with a railing? : A Lot 6 Click Score: 15    End of Session Equipment Utilized During Treatment: Gait belt Activity Tolerance: Patient limited by fatigue Patient left: with call bell/phone within reach;with bed alarm set;in bed Nurse Communication: Mobility status PT Visit Diagnosis: Unsteadiness on feet (R26.81);Other abnormalities of gait and mobility (R26.89);Muscle weakness (generalized) (M62.81)     Time: 1610-9604 PT Time Calculation (min) (ACUTE ONLY): 23 min  Charges:  $Therapeutic Activity: 23-37 mins                     11:20 AM, 03/03/18 Ocie Bob, MPT Physical Therapist with [facility] 336 [phone] office [phone] mobile phone

## 2018-03-03 NOTE — Progress Notes (Addendum)
Patient seen and examined, database reviewed.  No family members at bedside.  She is in no acute distress.   Vitals:   03/03/18 0803 03/03/18 1359  BP:  (!) 151/64  Pulse:  69  Resp:  18  Temp:  (!) 97.4 F (36.3 C)  SpO2: 92% 98%     Discussed with daughter Eber JonesCarolyn via phone.  She has some ongoing personal medical issues that prohibit her from being a full-time caregiver for this patient.  Patient today is refusing SNF placement.  Daughter is conflicted because she cannot care for her mother.  Have asked daughter to discuss with her sister and her mother; as a family they need to come up with a plan for care of this patient if she continues to refuse SNF placement.  As discussed on previous notes, patient does not have a medical reason for admission, although she continues to have a skilled PT need as evidenced by PT notes.  Social work is well aware of patient's current situation.  We have also not yet received SNF authorization from insurance. Urine cx with E Coli. This represents asymptomatic bacteriuria and will not be treated.  Peggye PittEstela Hernandez, MD [facility] Pager: [phone]

## 2018-03-03 NOTE — Clinical Social Work Note (Addendum)
Patient's daughter, Eber JonesCarolyn, said that she is unable to care of patient at discharge if patient is unable to walk, incontinent and unable to clean herself. Eber JonesCarolyn stated that she spoke with patient about these issues this morning.  She stated that patient's home is infested with bed bugs and that she is the only person in the family who will allow patient to stay in their home.  LCSW discussed that due to patient being competent, she cannot be forced to go anywhere.  LCSW also discussed that the family would have to speak with patient and assist her in developing a plan that she was agreeable to. Eber JonesCarolyn stated that she would contact Cardila who is still on vacation in [place] to discuss the situation.    Anvay Tennis, Juleen ChinaHeather D, LCSW

## 2018-03-04 LAB — CULTURE, BLOOD (ROUTINE X 2)
CULTURE: NO GROWTH
Culture: NO GROWTH
Special Requests: ADEQUATE
Special Requests: ADEQUATE

## 2018-03-04 LAB — GLUCOSE, CAPILLARY
GLUCOSE-CAPILLARY: 149 mg/dL — AB (ref 70–99)
GLUCOSE-CAPILLARY: 131 mg/dL — AB (ref 70–99)
GLUCOSE-CAPILLARY: 143 mg/dL — AB (ref 70–99)
Glucose-Capillary: 158 mg/dL — ABNORMAL HIGH (ref 70–99)

## 2018-03-04 NOTE — Care Management (Signed)
Per [name] [name], [facility], pt is ACTIVE with medicaid.

## 2018-03-04 NOTE — Clinical Social Work Note (Signed)
CSW following. Per MD, pt is agreeable to SNF rehab. Awaiting [employer] authorization [facility]. Will assist with dc when insurance Berkley Harveyauth is confirmed.

## 2018-03-04 NOTE — Progress Notes (Signed)
Physical Therapy Treatment Patient Details Name: [name] [name] MRN: [id number] DOB: [birth year] Today's Date: 03/04/2018    History of Present Illness [name] [name]  is a 84 y.o. female, with history of CAD, CVA, chronic diastolic and systolic CHF last EF 35 to 40% as per echo from January 2019, status post pacemaker placement, diabetes mellitus type 2, hypertension who was recently discharged from the hospital on 02/25/2018 today was brought to the hospital for worsening shortness of breath on exertion, multiple falls at home.    PT Comments    Pt received in bed and was agreeable to PT treatment. Pt required slightly less assistance for bed mobility but still demo'd increased labored movement due to weakness and increased time to complete task. Pt performed EOB BLE exercises and then stated that she was too fatigued to walk. PT allowed pt a few mins rest break but she still stated that she needed to rest for a while in the chair before attempting to ambulate; gentle encouragement to attempt walking with PT was provided, but she still declined and stated that she would walk later. Pt left in chair, chair alarm on, call bell within reach and RN notified. Continue to recommend venue below due to deficits in functional mobility, endurance, and overall strength.      Follow Up Recommendations  SNF     Equipment Recommendations  None recommended by PT    Recommendations for Other Services       Precautions / Restrictions Precautions Precautions: Fall Restrictions Weight Bearing Restrictions: No    Mobility  Bed Mobility Overal bed mobility: Needs Assistance Bed Mobility: Sit to Supine     Supine to sit: Min assist;Min guard     General bed mobility comments: very slow and labored bed mobility  Transfers Overall transfer level: Needs assistance Equipment used: Rolling walker (2 wheeled) Transfers: Sit to/from [employer]Stand;Stand Pivot Transfers Sit to Stand: Min assist Stand pivot  transfers: Min assist       General transfer comment: x2 attempted to come to full upright position as she stated that "something won't right" during her first attempt so she sat  back down and waited a few minutes before attempting again.  Ambulation/Gait Ambulation/Gait assistance: Min assist Gait Distance (Feet): 4 Feet Assistive device: Rolling walker (2 wheeled) Gait Pattern/deviations: Decreased step length - right;Decreased step length - left;Decreased stride length Gait velocity: decreased   General Gait Details: limited to SPT bed > chair due to subjective c/o fatigue and inability to walk with PT at this time   Stairs             Wheelchair Mobility    Modified Rankin (Stroke Patients Only)       Balance Overall balance assessment: Needs assistance Sitting-balance support: Feet supported;No upper extremity supported Sitting balance-Leahy Scale: Fair     Standing balance support: Bilateral upper extremity supported;During functional activity Standing balance-Leahy Scale: Fair Standing balance comment: using RW                            Cognition Arousal/Alertness: Awake/alert Behavior During Therapy: WFL for tasks assessed/performed Overall Cognitive Status: Within Functional Limits for tasks assessed                                        Exercises General Exercises - Lower Extremity Long Arc Quad: Seated;AROM;Strengthening;Both;10  reps Hip Flexion/Marching: Seated;AROM;Strengthening;Both;10 reps Toe Raises: Seated;Strengthening;AROM;Both;10 reps Heel Raises: Seated;AROM;Strengthening;Both;10 reps    General Comments        Pertinent Vitals/Pain Pain Assessment: No/denies pain    Home Living                      Prior Function            PT Goals (current goals can now be found in the care plan section) Acute Rehab PT Goals Patient Stated Goal: return home with family after rehab PT Goal  Formulation: With patient Time For Goal Achievement: 03/14/18 Potential to Achieve Goals: Good    Frequency    Min 3X/week      PT Plan      Co-evaluation              AM-PAC PT "6 Clicks" Daily Activity  Outcome Measure  Difficulty turning over in bed (including adjusting bedclothes, sheets and blankets)?: A Little Difficulty moving from lying on back to sitting on the side of the bed? : A Lot Difficulty sitting down on and standing up from a chair with arms (e.g., wheelchair, bedside commode, etc,.)?: A Little Help needed moving to and from a bed to chair (including a wheelchair)?: A Little Help needed walking in hospital room?: A Lot Help needed climbing 3-5 steps with a railing? : A Lot 6 Click Score: 15    End of Session Equipment Utilized During Treatment: Gait belt Activity Tolerance: Patient limited by fatigue Patient left: with call bell/phone within reach;with bed alarm set;in bed Nurse Communication: Mobility status PT Visit Diagnosis: Unsteadiness on feet (R26.81);Other abnormalities of gait and mobility (R26.89);Muscle weakness (generalized) (M62.81)     Time: 1610-9604 PT Time Calculation (min) (ACUTE ONLY): 13 min  Charges:  $Therapeutic Activity: 8-22 mins                         Jac Canavan PT, DPT

## 2018-03-04 NOTE — Progress Notes (Signed)
Patient seen and examined, database reviewed.  No family members at bedside.  Patient appears generally well, when evaluated sitting in recliner watching TV.  Vitals:   03/04/18 0558 03/04/18 0800  BP: (!) 167/65   Pulse: 64   Resp: 20   Temp: 98.1 F (36.7 C)   SpO2: 97% 96%   She states she is now ready to proceed with SNF placement.  She will be ready to discharge as soon as SNF authorization has been received.  Peggye PittEstela Hernandez, MD [facility] Pager: [phone]

## 2018-03-05 ENCOUNTER — Inpatient Hospital Stay (HOSPITAL_COMMUNITY): Payer: Medicare HMO

## 2018-03-05 DIAGNOSIS — E871 Hypo-osmolality and hyponatremia: Secondary | ICD-10-CM

## 2018-03-05 DIAGNOSIS — N390 Urinary tract infection, site not specified: Secondary | ICD-10-CM

## 2018-03-05 LAB — GLUCOSE, CAPILLARY
Glucose-Capillary: 183 mg/dL — ABNORMAL HIGH (ref 70–99)
Glucose-Capillary: 133 mg/dL — ABNORMAL HIGH (ref 70–99)
Glucose-Capillary: 177 mg/dL — ABNORMAL HIGH (ref 70–99)
Glucose-Capillary: 162 mg/dL — ABNORMAL HIGH (ref 70–99)

## 2018-03-05 LAB — BASIC METABOLIC PANEL
ANION GAP: 10 (ref 5–15)
BUN: 14 mg/dL (ref 8–23)
CHLORIDE: 89 mmol/L — AB (ref 98–111)
CO2: 28 mmol/L (ref 22–32)
Calcium: 8.5 mg/dL — ABNORMAL LOW (ref 8.9–10.3)
Creatinine, Ser: 0.59 mg/dL (ref 0.44–1.00)
GFR calc non Af Amer: >60 mL/min (ref >60)
Glucose, Bld: 142 mg/dL — ABNORMAL HIGH (ref 70–99)
Potassium: 4.1 mmol/L (ref 3.5–5.1)
SODIUM: 127 mmol/L — AB (ref 135–145)

## 2018-03-05 MED ORDER — CIPROFLOXACIN HCL 250 MG PO TABS
250.0000 mg | ORAL_TABLET | Freq: Two times a day (BID) | ORAL | Status: DC
  Administered 2018-03-05 – 2018-03-06 (×3): 250 mg via ORAL
  Filled 2018-03-05 (×3): qty 1

## 2018-03-05 MED ORDER — FUROSEMIDE 10 MG/ML IJ SOLN
20.0000 mg | Freq: Once | INTRAMUSCULAR | Status: AC
  Administered 2018-03-05: 20 mg via INTRAVENOUS
  Filled 2018-03-05: qty 2

## 2018-03-05 MED ORDER — IPRATROPIUM-ALBUTEROL 0.5-2.5 (3) MG/3ML IN SOLN
3.0000 mL | Freq: Two times a day (BID) | RESPIRATORY_TRACT | Status: DC
  Administered 2018-03-06: 3 mL via RESPIRATORY_TRACT
  Filled 2018-03-05: qty 3

## 2018-03-05 MED ORDER — ORAL CARE MOUTH RINSE
15.0000 mL | Freq: Two times a day (BID) | OROMUCOSAL | Status: DC
  Administered 2018-03-05 – 2018-03-06 (×2): 15 mL via OROMUCOSAL

## 2018-03-05 MED ORDER — IPRATROPIUM-ALBUTEROL 0.5-2.5 (3) MG/3ML IN SOLN
3.0000 mL | Freq: Four times a day (QID) | RESPIRATORY_TRACT | Status: DC
  Administered 2018-03-05 (×2): 3 mL via RESPIRATORY_TRACT
  Filled 2018-03-05 (×2): qty 3

## 2018-03-05 MED ORDER — CEPHALEXIN 500 MG PO CAPS: 500.0000 mg | ORAL_CAPSULE | Freq: Two times a day (BID) | ORAL | Status: DC

## 2018-03-05 NOTE — Progress Notes (Signed)
Pharmacy Antibiotic Note  [name] [name] is a 84 y.o. female admitted on 02/27/2018 with UTI.  Pharmacy has been consulted for cipro dosing.  Plan: cipro 250mg  po BID x 3 days Will sign off for now, please reconsult if warranted  Height: 5\' 10"  (177.8 cm) Weight: 196 lb 6.9 oz (89.1 kg) IBW/kg (Calculated) : 68.5  Temp (24hrs), Avg:98.1 F (36.7 C), Min:97.5 F (36.4 C), Max:98.6 F (37 C)  Recent Labs  Lab 02/27/18 1755 02/27/18 1907 02/28/18 0529 03/03/18 0509 03/05/18 0430  WBC 11.3*  --  9.3 5.8  --   CREATININE 0.86  --  0.73 0.63 0.59  LATICACIDVEN 2.7* 2.1*  --   --   --     Estimated Creatinine Clearance: 64.5 mL/min (by C-G formula based on SCr of 0.59 mg/dL).    Allergies  Allergen Reactions  . Penicillins     Has patient had a PCN reaction causing immediate rash, facial/tongue/throat swelling, SOB or lightheadedness with hypotension: Unknown Has patient had a PCN reaction causing severe rash involving mucus membranes or skin necrosis: Unknown Has patient had a PCN reaction that required hospitalization: Unknown Has patient had a PCN reaction occurring within the last 10 years: No If all of the above answers are "NO", then may proceed with Cephalosporin use.     Antimicrobials this admission: cipro 9/25 >>    Dose adjustments this admission: n/a  Microbiology results:  9/19 UCx: E.Coli >100,000 CFU/ml pan sensitive  Thank you for allowing pharmacy to be a part of this patient's care.  Elder Cyphers, BS Pharm D, BCPS Clinical Pharmacist Pager [phone] 03/05/2018 9:30 AM

## 2018-03-05 NOTE — Clinical Social Work Note (Signed)
LCSW following. Per Thayer Ohm at [facility], they received authorization for pt at 5pm yesterday, 03/04/18. In Progression today, MD indicated that pt is having respiratory difficulty and she does not feel pt can dc today. Updated Thayer Ohm who states that the [facility] authorization is good for 72 hours from the time it was given.   Will follow and assist with dc planning.

## 2018-03-05 NOTE — Progress Notes (Signed)
PROGRESS NOTE  [name] [name] NWG:[id number] DOB: 1933/02/28 DOA: 02/27/2018 PCP: Junie Spencer, FNP  HPI/Recap of past 24 hours: HPI from Dr Dorothe Pea Zawislak  is a 84 y.o. female, with history of CAD, CVA, chronic diastolic and systolic CHF last EF 35 to 40% as per echo from January 2019, status post pacemaker placement, diabetes mellitus type 2, hypertension who was recently discharged from the hospital on 02/25/2018, was brought to the hospital for worsening shortness of breath on exertion, multiple falls at home. In the ED, patient was found to have hyponatremia with sodium 122, BNP significantly elevated more than 4500, troponin 0.04.  Chest x-ray shows bilateral pleural effusions.  Also found to have abnormal UA and started on ciprofloxacin for UTI. Pt admitted for further management.  Today, pt reported worsening SOB, with noted increased WOB. Denies any chest pain, abdominal pain, cough, N/V/D/C, fever/chills.  Assessment/Plan: Principal Problem:   Chronic combined systolic (congestive) and diastolic (congestive) heart failure (HCC) Active Problems:   Sinoatrial node dysfunction (HCC)   Type II diabetes mellitus with peripheral autonomic neuropathy (HCC)   Elevated troponin   CHF exacerbation (HCC)  Acute on chronic combined systolic and diastolic HF Elevated BNP, BLE edema, with ongoing SOB Chest x-ray with pulmonary vascular congestion Echo on 06/2017 showed EF of 35% to 40%, with grade 1 diastolic dysfunction, PA peak pressure of 42 mmhg Continue Lasix Strict I's and O's, daily weights Poor functional status, unable to participate with PT Palliative team consulted  Hyponatremia Likely due to fluid overload state/HF, poor prognosis Continue Lasix Daily BMP  UTI UC grew greater than 100,000 E. Coli Will treat with 3 days of p.o. Ciprofloxacin, allergic to penicillin  Elevated troponin Chest pain-free, likely demand ischemia from HF EKG with paced  rhythm  Diabetes mellitus type 2 SSI, Accu-Cheks Hold home regimen  Hypothyroidism Continue Synthroid  Hypertension Stable Continue lisinopril, metoprolol  Generalized weakness/falls/deconditioned PT recommending SNF    Code Status: Full  Family Communication: None at bedside  Disposition Plan: Plan for SNF on 03/06/2018   Consultants:  Palliative  Procedures:  None  Antimicrobials:  Ciprofloxacin  DVT prophylaxis: Lovenox   Objective: Vitals:   03/05/18 0550 03/05/18 0559 03/05/18 1113 03/05/18 1330  BP:  (!) 177/75  (!) 131/58  Pulse:  73  60  Resp:  (!) 24  17  Temp:  (!) 97.5 F (36.4 C)  97.6 F (36.4 C)  TempSrc:  Oral  Oral  SpO2: 93% 95% 95% 97%  Weight:      Height:        Intake/Output Summary (Last 24 hours) at 03/05/2018 1428 Last data filed at 03/05/2018 1100 Gross per 24 hour  Intake 843 ml  Output 900 ml  Net -57 ml   Filed Weights   03/03/18 0500 03/04/18 0558 03/05/18 0500  Weight: 89.1 kg 87.6 kg 89.1 kg    Exam:   General: Mild distress  Cardiovascular: S1, S2 present  Respiratory: CTA B  Abdomen: Soft, nontender, nondistended, bowel sounds present  Musculoskeletal: Bilateral pedal edema  Skin: Normal  Psychiatry: Normal mood   Data Reviewed: CBC: Recent Labs  Lab 02/27/18 1755 02/28/18 0529 03/03/18 0509  WBC 11.3* 9.3 5.8  NEUTROABS 8.5*  --   --   HGB 9.1* 8.7* 8.0*  HCT 27.0* 25.6* 24.3*  MCV 86.3 85.6 87.7  PLT 265 234 229   Basic Metabolic Panel: Recent Labs  Lab 02/27/18 1755 02/28/18 0529 03/03/18 0509 03/05/18  0430  NA 122* 123* 128* 127*  K 4.2 3.2* 3.4* 4.1  CL 89* 88* 89* 89*  CO2 24 26 30 28   GLUCOSE 150* 141* 138* 142*  BUN 16 15 16 14   CREATININE 0.86 0.73 0.63 0.59  CALCIUM 8.8* 8.5* 8.1* 8.5*   GFR: Estimated Creatinine Clearance: 64.5 mL/min (by C-G formula based on SCr of 0.59 mg/dL). Liver Function Tests: Recent Labs  Lab 02/27/18 1755  AST 34  ALT 25   ALKPHOS 68  BILITOT 1.8*  PROT 6.4*  ALBUMIN 3.5   No results for input(s): LIPASE, AMYLASE in the last 168 hours. No results for input(s): AMMONIA in the last 168 hours. Coagulation Profile: No results for input(s): INR, PROTIME in the last 168 hours. Cardiac Enzymes: Recent Labs  Lab 02/27/18 1754 02/27/18 2112 02/28/18 0529 02/28/18 1048  TROPONINI 0.04* 0.04* 0.04* 0.03*   BNP (last 3 results) No results for input(s): PROBNP in the last 8760 hours. HbA1C: No results for input(s): HGBA1C in the last 72 hours. CBG: Recent Labs  Lab 03/04/18 1138 03/04/18 1614 03/04/18 2145 03/05/18 0829 03/05/18 1121  GLUCAP 149* 131* 143* 183* 133*   Lipid Profile: No results for input(s): CHOL, HDL, LDLCALC, TRIG, CHOLHDL, LDLDIRECT in the last 72 hours. Thyroid Function Tests: No results for input(s): TSH, T4TOTAL, FREET4, T3FREE, THYROIDAB in the last 72 hours. Anemia Panel: No results for input(s): VITAMINB12, FOLATE, FERRITIN, TIBC, IRON, RETICCTPCT in the last 72 hours. Urine analysis:    Component Value Date/Time   COLORURINE AMBER (A) 02/27/2018 2032   APPEARANCEUR CLOUDY (A) 02/27/2018 2032   APPEARANCEUR Clear 01/08/2018 1442   LABSPEC 1.015 02/27/2018 2032   PHURINE 6.0 02/27/2018 2032   GLUCOSEU NEGATIVE 02/27/2018 2032   GLUCOSEU NEGATIVE 12/23/2012 1110   HGBUR LARGE (A) 02/27/2018 2032   HGBUR large 05/16/2010 0746   BILIRUBINUR NEGATIVE 02/27/2018 2032   BILIRUBINUR Negative 01/08/2018 1442   KETONESUR NEGATIVE 02/27/2018 2032   PROTEINUR 100 (A) 02/27/2018 2032   UROBILINOGEN negative 07/05/2015 1431   UROBILINOGEN 0.2 12/23/2012 1110   NITRITE NEGATIVE 02/27/2018 2032   LEUKOCYTESUR LARGE (A) 02/27/2018 2032   LEUKOCYTESUR 1+ (A) 01/08/2018 1442   Sepsis Labs: @LABRCNTIP (procalcitonin:4,lacticidven:4)  ) Recent Results (from the past 240 hour(s))  MRSA PCR Screening     Status: None   Collection Time: 02/24/18  6:44 AM  Result Value Ref Range  Status   MRSA by PCR NEGATIVE NEGATIVE Final    Comment:        The GeneXpert MRSA Assay (FDA approved for NASAL specimens only), is one component of a comprehensive MRSA colonization surveillance program. It is not intended to diagnose MRSA infection nor to guide or monitor treatment for MRSA infections. Performed at [facility], [address] [place], [place] [address]   Urine culture     Status: Abnormal   Collection Time: 02/27/18  8:32 PM  Result Value Ref Range Status   Specimen Description   Final    URINE, RANDOM Performed at [facility], [address], [place], [place] [address]    Special Requests   Final    NONE Performed at [facility], [address], [place], [place] [address]    Culture >=100,000 COLONIES/mL ESCHERICHIA COLI (A)  Final   Report Status 03/02/2018 FINAL  Final   Organism ID, Bacteria ESCHERICHIA COLI (A)  Final      Susceptibility   Escherichia coli - MIC*    AMPICILLIN 8 SENSITIVE Sensitive  CEFAZOLIN <=4 SENSITIVE Sensitive     CEFTRIAXONE <=1 SENSITIVE Sensitive     CIPROFLOXACIN <=0.25 SENSITIVE Sensitive     GENTAMICIN <=1 SENSITIVE Sensitive     IMIPENEM <=0.25 SENSITIVE Sensitive     NITROFURANTOIN <=16 SENSITIVE Sensitive     TRIMETH/SULFA <=20 SENSITIVE Sensitive     AMPICILLIN/SULBACTAM 4 SENSITIVE Sensitive     PIP/TAZO <=4 SENSITIVE Sensitive     Extended ESBL NEGATIVE Sensitive     * >=100,000 COLONIES/mL ESCHERICHIA COLI  Blood culture (routine x 2)     Status: None   Collection Time: 02/27/18  9:12 PM  Result Value Ref Range Status   Specimen Description BLOOD LEFT HAND  Final   Special Requests   Final    BOTTLES DRAWN AEROBIC AND ANAEROBIC Blood Culture adequate volume   Culture   Final    NO GROWTH 5 DAYS Performed at [facility], [address], [place], [place] [address]    Report Status 03/04/2018 FINAL  Final  Blood culture (routine x 2)     Status: None   Collection Time: 02/27/18  9:20 PM  Result Value  Ref Range Status   Specimen Description BLOOD RIGHT HAND  Final   Special Requests   Final    BOTTLES DRAWN AEROBIC AND ANAEROBIC Blood Culture adequate volume   Culture   Final    NO GROWTH 5 DAYS Performed at [facility], [address] [place], [place] [address]    Report Status 03/04/2018 FINAL  Final      Studies: Dg Chest Port 1 View  Result Date: 03/05/2018 CLINICAL DATA:  Shortness of breath EXAM: PORTABLE CHEST 1 VIEW COMPARISON:  02/27/2018 FINDINGS: There is bilateral mild interstitial thickening. There is a small left pleural effusion. There is no pneumothorax. There is stable cardiomegaly. There is a dual lead cardiac pacemaker. The osseous structures are unremarkable. IMPRESSION: Mild CHF. Electronically Signed   By: Elige Ko   On: 03/05/2018 10:11    Scheduled Meds: . aspirin EC  81 mg Oral Daily  . ciprofloxacin  250 mg Oral BID  . enoxaparin (LOVENOX) injection  40 mg Subcutaneous Q24H  . furosemide  40 mg Oral Daily  . gabapentin  300 mg Oral TID  . insulin aspart  0-9 Units Subcutaneous TID WC  . ipratropium-albuterol  3 mL Nebulization Q6H  . levothyroxine  200 mcg Oral QAC breakfast  . lisinopril  10 mg Oral Daily  . mouth rinse  15 mL Mouth Rinse BID  . metoprolol succinate  25 mg Oral Daily  . mometasone-formoterol  2 puff Inhalation BID  . pravastatin  80 mg Oral q1800  . sertraline  50 mg Oral Daily  . sodium chloride flush  3 mL Intravenous Q12H  . tamsulosin  0.4 mg Oral QPC supper    Continuous Infusions: . sodium chloride       LOS: 5 days     Briant Cedar, MD [facility]   If 7PM-7AM, please contact night-coverage [email or web address] 03/05/2018, 2:28 PM

## 2018-03-06 ENCOUNTER — Encounter (HOSPITAL_COMMUNITY): Payer: Self-pay | Admitting: Primary Care

## 2018-03-06 DIAGNOSIS — M6281 Muscle weakness (generalized): Secondary | ICD-10-CM | POA: Diagnosis not present

## 2018-03-06 DIAGNOSIS — E1140 Type 2 diabetes mellitus with diabetic neuropathy, unspecified: Secondary | ICD-10-CM | POA: Diagnosis not present

## 2018-03-06 DIAGNOSIS — I5042 Chronic combined systolic (congestive) and diastolic (congestive) heart failure: Secondary | ICD-10-CM | POA: Diagnosis not present

## 2018-03-06 DIAGNOSIS — I5033 Acute on chronic diastolic (congestive) heart failure: Secondary | ICD-10-CM | POA: Diagnosis not present

## 2018-03-06 DIAGNOSIS — I495 Sick sinus syndrome: Secondary | ICD-10-CM | POA: Diagnosis not present

## 2018-03-06 DIAGNOSIS — I5043 Acute on chronic combined systolic (congestive) and diastolic (congestive) heart failure: Secondary | ICD-10-CM | POA: Diagnosis not present

## 2018-03-06 DIAGNOSIS — Z23 Encounter for immunization: Secondary | ICD-10-CM | POA: Diagnosis not present

## 2018-03-06 DIAGNOSIS — R748 Abnormal levels of other serum enzymes: Secondary | ICD-10-CM | POA: Diagnosis not present

## 2018-03-06 DIAGNOSIS — Z515 Encounter for palliative care: Secondary | ICD-10-CM | POA: Diagnosis not present

## 2018-03-06 DIAGNOSIS — Z7189 Other specified counseling: Secondary | ICD-10-CM | POA: Diagnosis not present

## 2018-03-06 DIAGNOSIS — G4700 Insomnia, unspecified: Secondary | ICD-10-CM | POA: Diagnosis not present

## 2018-03-06 DIAGNOSIS — I10 Essential (primary) hypertension: Secondary | ICD-10-CM | POA: Diagnosis not present

## 2018-03-06 DIAGNOSIS — E1143 Type 2 diabetes mellitus with diabetic autonomic (poly)neuropathy: Secondary | ICD-10-CM | POA: Diagnosis not present

## 2018-03-06 DIAGNOSIS — N390 Urinary tract infection, site not specified: Secondary | ICD-10-CM | POA: Diagnosis not present

## 2018-03-06 DIAGNOSIS — R69 Illness, unspecified: Secondary | ICD-10-CM | POA: Diagnosis not present

## 2018-03-06 DIAGNOSIS — Z743 Need for continuous supervision: Secondary | ICD-10-CM | POA: Diagnosis not present

## 2018-03-06 DIAGNOSIS — E871 Hypo-osmolality and hyponatremia: Secondary | ICD-10-CM | POA: Diagnosis not present

## 2018-03-06 DIAGNOSIS — Z950 Presence of cardiac pacemaker: Secondary | ICD-10-CM | POA: Diagnosis not present

## 2018-03-06 LAB — GLUCOSE, CAPILLARY
GLUCOSE-CAPILLARY: 138 mg/dL — AB (ref 70–99)
GLUCOSE-CAPILLARY: 167 mg/dL — AB (ref 70–99)

## 2018-03-06 LAB — CREATININE, SERUM: Creatinine, Ser: 0.67 mg/dL (ref 0.44–1.00)

## 2018-03-06 MED ORDER — CIPROFLOXACIN HCL 250 MG PO TABS: 250.0000 mg | ORAL_TABLET | Freq: Two times a day (BID) | ORAL | 0 refills | Status: AC

## 2018-03-06 MED ORDER — NYSTATIN 100000 UNIT/GM EX POWD
Freq: Three times a day (TID) | CUTANEOUS | Status: DC
  Administered 2018-03-06: 11:00:00 via TOPICAL
  Filled 2018-03-06: qty 15

## 2018-03-06 MED ORDER — ALPRAZOLAM 0.25 MG PO TABS: 0.2500 mg | ORAL_TABLET | Freq: Every evening | ORAL | Status: DC | PRN

## 2018-03-06 NOTE — Care Management Note (Signed)
Case Management Note  Patient Details  Name: [name] [name] MRN: [id number] Date of Birth: Jul 07, 1932   If discussed at Long Length of Stay Meetings, dates discussed:  03/06/28  Additional Comments: DC to SNF today. [facility] rep aware.   Malcolm Metro, RN 03/06/2018, 11:35 AM

## 2018-03-06 NOTE — Care Management Important Message (Signed)
Important Message  Patient Details  Name: [name] [name] MRN: [id number] Date of Birth: [birth year]   Medicare Important Message Given:  Yes    Malcolm Metro, RN 03/06/2018, 11:35 AM

## 2018-03-06 NOTE — Clinical Social Work Placement (Signed)
   CLINICAL SOCIAL WORK PLACEMENT  NOTE  Date:  03/06/2018  Patient Details  Name: [name] [name] MRN: [id number] Date of Birth: October 03, 1932  Clinical Social Work is seeking post-discharge placement for this patient at the Skilled  Nursing Facility level of care (*CSW will initial, date and re-position this form in  chart as items are completed):  Yes   Patient/family provided with [place] Clinical Social Work Department's list of facilities offering this level of care within the geographic area requested by the patient (or if unable, by the patient's family).  Yes   Patient/family informed of their freedom to choose among providers that offer the needed level of care, that participate in Medicare, Medicaid or managed care program needed by the patient, have an available bed and are willing to accept the patient.  Yes   Patient/family informed of 's ownership interest in [facility] and [facility], as well as of the fact that they are under no obligation to receive care at these facilities.  PASRR submitted to EDS on       PASRR number received on       Existing PASRR number confirmed on 02/28/18     FL2 transmitted to all facilities in geographic area requested by pt/family on 02/28/18     FL2 transmitted to all facilities within larger geographic area on       Patient informed that his/her managed care company has contracts with or will negotiate with certain facilities, including the following:        Yes   Patient/family informed of bed offers received.  Patient chooses bed at [facility]     Physician recommends and patient chooses bed at      Patient to be transferred to [facility] on 03/06/18.  Patient to be transferred to facility by SNF w/C Zenaida Niece     Patient family notified on 03/06/18 of transfer.  Name of family member notified:  [name]     PHYSICIAN       Additional Comment: Pt stable for dc today per MD. Jackson Latino at  [facility]. They can transfer pt in their [facility] today. Awaiting update on time of the transport and will update RN once known. RN aware of pending dc plan and she will call report. HIPPA compliant VMM left for pt's daughter [name] to update. DC clinical sent through [employer]. There are no other CSW needs for dc.   _______________________________________________ Elliot Gault, LCSW 03/06/2018, 12:43 PM

## 2018-03-06 NOTE — Discharge Summary (Signed)
Discharge Summary  [name] [name] WUJ:[id number] DOB: May 20, 1933  PCP: Junie Spencer, FNP  Admit date: 02/27/2018 Discharge date: 03/06/2018  Time spent: 40 mins  Recommendations for Outpatient Follow-up:  1. As per SNF   Discharge Diagnoses:  Active Hospital Problems   Diagnosis Date Noted  . Chronic combined systolic (congestive) and diastolic (congestive) heart failure (HCC) 07/02/2017  . CHF exacerbation (HCC) 02/27/2018  . Elevated troponin 06/28/2017  . Type II diabetes mellitus with peripheral autonomic neuropathy (HCC)   . Sinoatrial node dysfunction [facility])     Resolved Hospital Problems  No resolved problems to display.    Discharge Condition: Stable  Diet recommendation: Heart healthy  Vitals:   03/06/18 0505 03/06/18 0752  BP: (!) 154/55   Pulse: 64   Resp: 20   Temp: 98 F (36.7 C)   SpO2: 97% 98%    History of present illness:  [name],with history of CAD, CVA, chronic diastolic and systolic CHF last EF 35 to 40% as per echo from January 2019, status post pacemaker placement, diabetes mellitus type 2, hypertension who was recently discharged from the hospital on 02/25/2018, was brought to the hospital for worsening shortness of breath on exertion, multiple falls at home. In the ED, patient was found to have hyponatremia with sodium 122, BNP significantly elevated more than 4500, troponin 0.04. Chest x-ray shows bilateral pleural effusions. Also found to have abnormal UA and started on ciprofloxacin for UTI. Pt admitted for further management.  Today, pt reported feeling better, Denies any chest pain, abdominal pain, cough, N/V/D/C, fever/chills. Stable to be discharged to SNF.  Hospital Course:  Principal Problem:   Chronic combined systolic (congestive) and diastolic (congestive) heart failure (HCC) Active Problems:   Sinoatrial node dysfunction (HCC)   Type II diabetes mellitus with peripheral autonomic neuropathy (HCC)  Elevated troponin   CHF exacerbation (HCC)  Acute on chronic combined systolic and diastolic HF Elevated BNP Chest x-ray with mild pulmonary vascular congestion Echo on 06/2017 showed EF of 35% to 40%, with grade 1 diastolic dysfunction, PA peak pressure of 42 mmhg Continue PO Lasix Strict I's and O's, daily weights Poor functional status, unable to participate with PT Palliative team consulted  Hyponatremia Likely due to fluid overload state/HF, poor prognosis Continue Lasix  UTI UC grew greater than 100,000 E. Coli Continue PO Ciprofloxacin till 03/07/18 for 3 days total, allergic to penicillin  Elevated troponin Chest pain-free, likely demand ischemia from HF EKG with paced rhythm  Diabetes mellitus type 2 Continue home regimen  Hypothyroidism Continue Synthroid  Hypertension Stable Continue lisinopril, metoprolol  Generalized weakness/falls/deconditioned PT recommending SNF    Procedures:  None   Consultations:  Palliative  Discharge Exam: BP (!) 154/55   Pulse 64   Temp 98 F (36.7 C) (Oral)   Resp 20   Ht 5\' 10"  (1.778 m)   Wt 89.8 kg   SpO2 98%   BMI 28.41 kg/m   General: NAD Cardiovascular: S1, S2 present Respiratory: Bibasilar crackles noted    Discharge Instructions You were cared for by a hospitalist during your hospital stay. If you have any questions about your discharge medications or the care you received while you were in the hospital after you are discharged, you can call the unit and asked to speak with the hospitalist on call if the hospitalist that took care of you is not available. Once you are discharged, your primary care physician will handle any further medical issues. Please note that NO  REFILLS for any discharge medications will be authorized once you are discharged, as it is imperative that you return to your primary care physician (or establish a relationship with a primary care physician if you do not have one) for  your aftercare needs so that they can reassess your need for medications and monitor your lab values.  Discharge Instructions    Diet - low sodium heart healthy   Complete by:  As directed    Increase activity slowly   Complete by:  As directed      Allergies as of 03/06/2018      Reactions   Penicillins    Has patient had a PCN reaction causing immediate rash, facial/tongue/throat swelling, SOB or lightheadedness with hypotension: Unknown Has patient had a PCN reaction causing severe rash involving mucus membranes or skin necrosis: Unknown Has patient had a PCN reaction that required hospitalization: Unknown Has patient had a PCN reaction occurring within the last 10 years: No If all of the above answers are "NO", then may proceed with Cephalosporin use.      Medication List    TAKE these medications   acetaminophen 325 MG tablet Commonly known as:  TYLENOL Take 2 tablets (650 mg total) by mouth every 6 (six) hours as needed for mild pain (or Fever >/= 101).   ALPRAZolam 0.25 MG tablet Commonly known as:  XANAX Take 1 tablet (0.25 mg total) by mouth at bedtime as needed for anxiety. What changed:    medication strength  how much to take   aspirin EC 81 MG tablet Take 81 mg by mouth daily. Reported on 12/14/2015   budesonide-formoterol 80-4.5 MCG/ACT inhaler Commonly known as:  SYMBICORT 2 PUFFS 2 TIMES A DAY   ciprofloxacin 250 MG tablet Commonly known as:  CIPRO Take 1 tablet (250 mg total) by mouth 2 (two) times daily for 3 doses.   furosemide 40 MG tablet Commonly known as:  LASIX Take 1 tablet (40 mg total) by mouth daily.   gabapentin 300 MG capsule Commonly known as:  NEURONTIN TAKE (1) CAPSULE THREE TIMES DAILY.   glucose blood test strip Use BID   levothyroxine 200 MCG tablet Commonly known as:  SYNTHROID, LEVOTHROID TAKE (1) TABLET DAILY BE- FORE BREAKFAST.   lisinopril 10 MG tablet Commonly known as:  PRINIVIL,ZESTRIL TAKE 1 TABLET DAILY     metFORMIN 1000 MG tablet Commonly known as:  GLUCOPHAGE TAKE (1) TABLET TWICE A DAY WITH MEALS (BREAKFAST AND SUPPER) FOR DIAB ETES   metoprolol succinate 25 MG 24 hr tablet Commonly known as:  TOPROL-XL TAKE (1) TABLET DAILY WITH FOOD.   nitroGLYCERIN 0.4 MG/SPRAY spray Commonly known as:  NITROLINGUAL Place 1 spray under the tongue every 5 (five) minutes as needed.   nystatin-triamcinolone cream Commonly known as:  MYCOLOG II APPLY TO AFFECTED AREA 3 TIMES A DAY   pravastatin 80 MG tablet Commonly known as:  PRAVACHOL TAKE 1 TABLET ONCE DAILY FOR CHOLESTEROL   PROAIR HFA 108 (90 Base) MCG/ACT inhaler Generic drug:  albuterol 2 PUFFS EVERY 6 HOURS AS NEEDED FOR WHEEZING   sertraline 50 MG tablet Commonly known as:  ZOLOFT TAKE 1 TABLET DAILY   tamsulosin 0.4 MG Caps capsule Commonly known as:  FLOMAX Take 1 capsule (0.4 mg total) by mouth daily after supper.      Allergies  Allergen Reactions  . Penicillins     Has patient had a PCN reaction causing immediate rash, facial/tongue/throat swelling, SOB or lightheadedness with hypotension: Unknown  Has patient had a PCN reaction causing severe rash involving mucus membranes or skin necrosis: Unknown Has patient had a PCN reaction that required hospitalization: Unknown Has patient had a PCN reaction occurring within the last 10 years: No If all of the above answers are "NO", then may proceed with Cephalosporin use.    Contact information for after-discharge care    Destination    [facility] Preferred SNF .   Service:  Skilled Nursing Contact information: [address] [place] [address] [phone]               The results of significant diagnostics from this hospitalization (including imaging, microbiology, ancillary and laboratory) are listed below for reference.    Significant Diagnostic Studies: Dg Chest 1 View  Result Date: 02/23/2018 CLINICAL DATA:  Fall EXAM: CHEST  1  VIEW COMPARISON:  10/14/2017 FINDINGS: Small right pleural effusion. Associated right lower lobe opacity, likely atelectasis. Cardiomegaly. No frank interstitial edema. Left subclavian pacemaker. IMPRESSION: Small right pleural effusion. Associated right lower lobe opacity, likely atelectasis. Electronically Signed   By: Charline Bills M.D.   On: 02/23/2018 13:52   Dg Chest 2 View  Result Date: 02/27/2018 CLINICAL DATA:  Multiple recent falls. EXAM: CHEST - 2 VIEW COMPARISON:  Chest radiograph 02/23/2018 FINDINGS: Left chest wall 2 lead pacemaker with unchanged position of leads. There is moderate cardiomegaly with small bilateral pleural effusions and associated atelectasis. No focal airspace consolidation or pulmonary edema. No fracture visualized. IMPRESSION: Moderate cardiomegaly and small pleural effusions with associated atelectasis. Electronically Signed   By: Deatra Robinson M.D.   On: 02/27/2018 18:53   Dg Lumbar Spine Complete  Result Date: 02/23/2018 CLINICAL DATA:  Fall EXAM: LUMBAR SPINE - COMPLETE 4+ VIEW COMPARISON:  None. FINDINGS: Five lumbar-type vertebral bodies. Mild straightening of the lumbar spine. No evidence of fracture or dislocation. Vertebral body heights are maintained. Mild to moderate multilevel degenerative changes. Visualized bony pelvis appears intact. IMPRESSION: No fracture or dislocation is seen. Mild to moderate degenerative changes. Electronically Signed   By: Charline Bills M.D.   On: 02/23/2018 13:50   Dg Pelvis 1-2 Views  Result Date: 02/27/2018 CLINICAL DATA:  Multiple falls, evaluate for fracture. EXAM: PELVIS - 1-2 VIEW COMPARISON:  10/15/2016 FINDINGS: Moderate-to-marked degenerative disc disease and endplate spurring of the included lumbar spine from L3 through S1. The bony pelvis appears intact. There is mild osteoarthritic change of the right SI joint with sclerosis. No pelvic diastasis or fracture. Mild enthesopathy off the anterior superior iliac  spines, left greater than right. Joint space narrowing of both hips without fracture or suspicious osseous lesions. IMPRESSION: Lumbar spondylosis. No acute pelvic or hip fracture is identified. If the patient has pain out of proportion to radiographic findings, CT may help identify radiographically occult fractures. Electronically Signed   By: Tollie Eth M.D.   On: 02/27/2018 18:53   Ct Head Wo Contrast  Result Date: 02/23/2018 CLINICAL DATA:  Larey Seat.  Hit head. EXAM: CT HEAD WITHOUT CONTRAST CT MAXILLOFACIAL WITHOUT CONTRAST CT CERVICAL SPINE WITHOUT CONTRAST TECHNIQUE: Multidetector CT imaging of the head, cervical spine, and maxillofacial structures were performed using the standard protocol without intravenous contrast. Multiplanar CT image reconstructions of the cervical spine and maxillofacial structures were also generated. COMPARISON:  07/02/2017 FINDINGS: CT HEAD FINDINGS Brain: Stable age related cerebral atrophy, ventriculomegaly and periventricular white matter disease. No extra-axial fluid collections are identified. No CT findings for acute hemispheric infarction or intracranial hemorrhage. No mass  lesions. The brainstem and cerebellum are normal. Vascular: No hyperdense vessel or unexpected calcification. Skull: No acute skull fracture. Other: No scalp hematoma or obvious laceration. CT MAXILLOFACIAL FINDINGS Osseous: No acute facial bone fractures. The mandibular condyles are normally located. No mandible fracture. Orbits: No orbital fracture.  The globes are intact. Sinuses: The paranasal sinuses and mastoid air cells are clear. Concha bullosa of the left middle turbinates noted and there is deviation of the bony nasal septum rightward. Soft tissues: No significant soft tissue swelling or hematoma. CT CERVICAL SPINE FINDINGS Alignment: Exam degraded by motion artifact. Grossly normal alignment. Skull base and vertebrae: No obvious fracture. Soft tissues and spinal canal: No abnormal prevertebral  soft tissue swelling or canal hematoma. Disc levels: The spinal canal is generous. No spinal stenosis. Mild multilevel foraminal narrowing due to uncinate spurring. Upper chest: The lung apices are grossly clear. Other: Permanent left-sided pacemaker noted. Advanced carotid artery calcifications. IMPRESSION: 1. No acute intracranial findings or skull fracture. 2. No acute facial bone fractures. 3. Cervical spine images are degraded by motion artifact but grossly normal alignment and no obvious acute fracture. Electronically Signed   By: Rudie Meyer M.D.   On: 02/23/2018 13:44   Ct Cervical Spine Wo Contrast  Result Date: 02/23/2018 CLINICAL DATA:  Larey Seat.  Hit head. EXAM: CT HEAD WITHOUT CONTRAST CT MAXILLOFACIAL WITHOUT CONTRAST CT CERVICAL SPINE WITHOUT CONTRAST TECHNIQUE: Multidetector CT imaging of the head, cervical spine, and maxillofacial structures were performed using the standard protocol without intravenous contrast. Multiplanar CT image reconstructions of the cervical spine and maxillofacial structures were also generated. COMPARISON:  07/02/2017 FINDINGS: CT HEAD FINDINGS Brain: Stable age related cerebral atrophy, ventriculomegaly and periventricular white matter disease. No extra-axial fluid collections are identified. No CT findings for acute hemispheric infarction or intracranial hemorrhage. No mass lesions. The brainstem and cerebellum are normal. Vascular: No hyperdense vessel or unexpected calcification. Skull: No acute skull fracture. Other: No scalp hematoma or obvious laceration. CT MAXILLOFACIAL FINDINGS Osseous: No acute facial bone fractures. The mandibular condyles are normally located. No mandible fracture. Orbits: No orbital fracture.  The globes are intact. Sinuses: The paranasal sinuses and mastoid air cells are clear. Concha bullosa of the left middle turbinates noted and there is deviation of the bony nasal septum rightward. Soft tissues: No significant soft tissue swelling or  hematoma. CT CERVICAL SPINE FINDINGS Alignment: Exam degraded by motion artifact. Grossly normal alignment. Skull base and vertebrae: No obvious fracture. Soft tissues and spinal canal: No abnormal prevertebral soft tissue swelling or canal hematoma. Disc levels: The spinal canal is generous. No spinal stenosis. Mild multilevel foraminal narrowing due to uncinate spurring. Upper chest: The lung apices are grossly clear. Other: Permanent left-sided pacemaker noted. Advanced carotid artery calcifications. IMPRESSION: 1. No acute intracranial findings or skull fracture. 2. No acute facial bone fractures. 3. Cervical spine images are degraded by motion artifact but grossly normal alignment and no obvious acute fracture. Electronically Signed   By: Rudie Meyer M.D.   On: 02/23/2018 13:44   Dg Chest Port 1 View  Result Date: 03/05/2018 CLINICAL DATA:  Shortness of breath EXAM: PORTABLE CHEST 1 VIEW COMPARISON:  02/27/2018 FINDINGS: There is bilateral mild interstitial thickening. There is a small left pleural effusion. There is no pneumothorax. There is stable cardiomegaly. There is a dual lead cardiac pacemaker. The osseous structures are unremarkable. IMPRESSION: Mild CHF. Electronically Signed   By: Elige Ko   On: 03/05/2018 10:11   Dg Hip Unilat W Or  Wo Pelvis 2-3 Views Right  Result Date: 02/23/2018 CLINICAL DATA:  Fall EXAM: DG HIP (WITH OR WITHOUT PELVIS) 2-3V RIGHT COMPARISON:  None. FINDINGS: No fracture or dislocation is seen. Bilateral hip joint spaces are preserved. Visualized bony pelvis appears intact. Degenerative changes of the lower lumbar spine. IMPRESSION: Negative. Electronically Signed   By: Charline Bills M.D.   On: 02/23/2018 13:51   Ct Maxillofacial Wo Contrast  Result Date: 02/23/2018 CLINICAL DATA:  Larey Seat.  Hit head. EXAM: CT HEAD WITHOUT CONTRAST CT MAXILLOFACIAL WITHOUT CONTRAST CT CERVICAL SPINE WITHOUT CONTRAST TECHNIQUE: Multidetector CT imaging of the head, cervical  spine, and maxillofacial structures were performed using the standard protocol without intravenous contrast. Multiplanar CT image reconstructions of the cervical spine and maxillofacial structures were also generated. COMPARISON:  07/02/2017 FINDINGS: CT HEAD FINDINGS Brain: Stable age related cerebral atrophy, ventriculomegaly and periventricular white matter disease. No extra-axial fluid collections are identified. No CT findings for acute hemispheric infarction or intracranial hemorrhage. No mass lesions. The brainstem and cerebellum are normal. Vascular: No hyperdense vessel or unexpected calcification. Skull: No acute skull fracture. Other: No scalp hematoma or obvious laceration. CT MAXILLOFACIAL FINDINGS Osseous: No acute facial bone fractures. The mandibular condyles are normally located. No mandible fracture. Orbits: No orbital fracture.  The globes are intact. Sinuses: The paranasal sinuses and mastoid air cells are clear. Concha bullosa of the left middle turbinates noted and there is deviation of the bony nasal septum rightward. Soft tissues: No significant soft tissue swelling or hematoma. CT CERVICAL SPINE FINDINGS Alignment: Exam degraded by motion artifact. Grossly normal alignment. Skull base and vertebrae: No obvious fracture. Soft tissues and spinal canal: No abnormal prevertebral soft tissue swelling or canal hematoma. Disc levels: The spinal canal is generous. No spinal stenosis. Mild multilevel foraminal narrowing due to uncinate spurring. Upper chest: The lung apices are grossly clear. Other: Permanent left-sided pacemaker noted. Advanced carotid artery calcifications. IMPRESSION: 1. No acute intracranial findings or skull fracture. 2. No acute facial bone fractures. 3. Cervical spine images are degraded by motion artifact but grossly normal alignment and no obvious acute fracture. Electronically Signed   By: Rudie Meyer M.D.   On: 02/23/2018 13:44    Microbiology: Recent Results (from  the past 240 hour(s))  Urine culture     Status: Abnormal   Collection Time: 02/27/18  8:32 PM  Result Value Ref Range Status   Specimen Description   Final    URINE, RANDOM Performed at [facility], [address] [place], [place] [address]    Special Requests   Final    NONE Performed at [facility], [address], [place], [place] [address]    Culture >=100,000 COLONIES/mL ESCHERICHIA COLI (A)  Final   Report Status 03/02/2018 FINAL  Final   Organism ID, Bacteria ESCHERICHIA COLI (A)  Final      Susceptibility   Escherichia coli - MIC*    AMPICILLIN 8 SENSITIVE Sensitive     CEFAZOLIN <=4 SENSITIVE Sensitive     CEFTRIAXONE <=1 SENSITIVE Sensitive     CIPROFLOXACIN <=0.25 SENSITIVE Sensitive     GENTAMICIN <=1 SENSITIVE Sensitive     IMIPENEM <=0.25 SENSITIVE Sensitive     NITROFURANTOIN <=16 SENSITIVE Sensitive     TRIMETH/SULFA <=20 SENSITIVE Sensitive     AMPICILLIN/SULBACTAM 4 SENSITIVE Sensitive     PIP/TAZO <=4 SENSITIVE Sensitive     Extended ESBL NEGATIVE Sensitive     * >=100,000 COLONIES/mL ESCHERICHIA COLI  Blood culture (routine x 2)  Status: None   Collection Time: 02/27/18  9:12 PM  Result Value Ref Range Status   Specimen Description BLOOD LEFT HAND  Final   Special Requests   Final    BOTTLES DRAWN AEROBIC AND ANAEROBIC Blood Culture adequate volume   Culture   Final    NO GROWTH 5 DAYS Performed at [facility], [address] [place], [place] [address]    Report Status 03/04/2018 FINAL  Final  Blood culture (routine x 2)     Status: None   Collection Time: 02/27/18  9:20 PM  Result Value Ref Range Status   Specimen Description BLOOD RIGHT HAND  Final   Special Requests   Final    BOTTLES DRAWN AEROBIC AND ANAEROBIC Blood Culture adequate volume   Culture   Final    NO GROWTH 5 DAYS Performed at [facility], [address], [place], [place] [address]    Report Status 03/04/2018 FINAL  Final     Labs: Basic Metabolic Panel: Recent Labs    Lab 02/27/18 1755 02/28/18 0529 03/03/18 0509 03/05/18 0430 03/06/18 0527  NA 122* 123* 128* 127*  --   K 4.2 3.2* 3.4* 4.1  --   CL 89* 88* 89* 89*  --   CO2 24 26 30 28   --   GLUCOSE 150* 141* 138* 142*  --   BUN 16 15 16 14   --   CREATININE 0.86 0.73 0.63 0.59 0.67  CALCIUM 8.8* 8.5* 8.1* 8.5*  --    Liver Function Tests: Recent Labs  Lab 02/27/18 1755  AST 34  ALT 25  ALKPHOS 68  BILITOT 1.8*  PROT 6.4*  ALBUMIN 3.5   No results for input(s): LIPASE, AMYLASE in the last 168 hours. No results for input(s): AMMONIA in the last 168 hours. CBC: Recent Labs  Lab 02/27/18 1755 02/28/18 0529 03/03/18 0509  WBC 11.3* 9.3 5.8  NEUTROABS 8.5*  --   --   HGB 9.1* 8.7* 8.0*  HCT 27.0* 25.6* 24.3*  MCV 86.3 85.6 87.7  PLT 265 234 229   Cardiac Enzymes: Recent Labs  Lab 02/27/18 1754 02/27/18 2112 02/28/18 0529 02/28/18 1048  TROPONINI 0.04* 0.04* 0.04* 0.03*   BNP: BNP (last 3 results) Recent Labs    07/01/17 2254 02/23/18 1200 02/27/18 1754  BNP 1,641.0* 4,168.0* >4,500.0*    ProBNP (last 3 results) No results for input(s): PROBNP in the last 8760 hours.  CBG: Recent Labs  Lab 03/05/18 0829 03/05/18 1121 03/05/18 1551 03/05/18 2120 03/06/18 0800  GLUCAP 183* 133* 177* 162* 138*       Signed:  Briant Cedar, MD [facility] 03/06/2018, 10:25 AM
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
## 2018-03-06 NOTE — Progress Notes (Signed)
Patient discharged to [facility] [place],report called and given to [facility] LPN. IV discontinued catheter intact. Transported by Facility Zena[facility] Niece to awaiting floor.

## 2018-03-10 DIAGNOSIS — I5033 Acute on chronic diastolic (congestive) heart failure: Secondary | ICD-10-CM | POA: Diagnosis not present

## 2018-03-10 DIAGNOSIS — R69 Illness, unspecified: Secondary | ICD-10-CM | POA: Diagnosis not present

## 2018-03-10 DIAGNOSIS — E1143 Type 2 diabetes mellitus with diabetic autonomic (poly)neuropathy: Secondary | ICD-10-CM | POA: Diagnosis not present

## 2018-03-10 DIAGNOSIS — N390 Urinary tract infection, site not specified: Secondary | ICD-10-CM | POA: Diagnosis not present

## 2018-03-17 DIAGNOSIS — I5042 Chronic combined systolic (congestive) and diastolic (congestive) heart failure: Secondary | ICD-10-CM | POA: Diagnosis not present

## 2018-03-19 DIAGNOSIS — M6281 Muscle weakness (generalized): Secondary | ICD-10-CM | POA: Diagnosis not present

## 2018-03-19 DIAGNOSIS — I5042 Chronic combined systolic (congestive) and diastolic (congestive) heart failure: Secondary | ICD-10-CM | POA: Diagnosis not present

## 2018-03-19 DIAGNOSIS — I10 Essential (primary) hypertension: Secondary | ICD-10-CM | POA: Diagnosis not present

## 2018-03-19 DIAGNOSIS — E1140 Type 2 diabetes mellitus with diabetic neuropathy, unspecified: Secondary | ICD-10-CM | POA: Diagnosis not present

## 2018-03-21 DIAGNOSIS — R69 Illness, unspecified: Secondary | ICD-10-CM | POA: Diagnosis not present

## 2018-03-21 DIAGNOSIS — I2510 Atherosclerotic heart disease of native coronary artery without angina pectoris: Secondary | ICD-10-CM | POA: Diagnosis not present

## 2018-03-21 DIAGNOSIS — I5042 Chronic combined systolic (congestive) and diastolic (congestive) heart failure: Secondary | ICD-10-CM | POA: Diagnosis not present

## 2018-03-21 DIAGNOSIS — J449 Chronic obstructive pulmonary disease, unspecified: Secondary | ICD-10-CM | POA: Diagnosis not present

## 2018-03-21 DIAGNOSIS — I495 Sick sinus syndrome: Secondary | ICD-10-CM | POA: Diagnosis not present

## 2018-03-21 DIAGNOSIS — E1142 Type 2 diabetes mellitus with diabetic polyneuropathy: Secondary | ICD-10-CM | POA: Diagnosis not present

## 2018-03-21 DIAGNOSIS — Z7982 Long term (current) use of aspirin: Secondary | ICD-10-CM | POA: Diagnosis not present

## 2018-03-21 DIAGNOSIS — I110 Hypertensive heart disease with heart failure: Secondary | ICD-10-CM | POA: Diagnosis not present

## 2018-03-21 DIAGNOSIS — Z7984 Long term (current) use of oral hypoglycemic drugs: Secondary | ICD-10-CM | POA: Diagnosis not present

## 2018-03-21 DIAGNOSIS — M170 Bilateral primary osteoarthritis of knee: Secondary | ICD-10-CM | POA: Diagnosis not present

## 2018-03-24 ENCOUNTER — Other Ambulatory Visit: Payer: Self-pay | Admitting: Family

## 2018-03-24 DIAGNOSIS — F411 Generalized anxiety disorder: Secondary | ICD-10-CM

## 2018-03-25 ENCOUNTER — Ambulatory Visit: Payer: Medicare HMO | Admitting: Family

## 2018-03-25 DIAGNOSIS — R69 Illness, unspecified: Secondary | ICD-10-CM | POA: Diagnosis not present

## 2018-03-25 NOTE — Telephone Encounter (Signed)
Last seen 02/27/18

## 2018-04-02 ENCOUNTER — Ambulatory Visit: Payer: Medicare HMO | Admitting: Family

## 2018-04-02 ENCOUNTER — Telehealth: Payer: Self-pay | Admitting: Family

## 2018-04-02 NOTE — Telephone Encounter (Signed)
FYI Patient has a follow up appointment scheduled on Monday.  She has diarrhea and could not come today.  Advised her to bring all medications to appointment for review.

## 2018-04-07 ENCOUNTER — Ambulatory Visit: Payer: Medicare HMO | Admitting: Family

## 2018-04-09 ENCOUNTER — Telehealth: Payer: Self-pay | Admitting: *Deleted

## 2018-04-09 NOTE — Telephone Encounter (Signed)
VM from [facility] PT w/ [facility] Pt seen today, ankles more swollen than usual & SOB, wgt gain was only 1 lb. Pt was only taking 1 tablet of her Torsemide, she was re-instructed to take this 3 times daily I reconciled this medication which was Rx'd on 03/22/18

## 2018-04-10 ENCOUNTER — Ambulatory Visit (INDEPENDENT_AMBULATORY_CARE_PROVIDER_SITE_OTHER): Payer: Medicare HMO

## 2018-04-10 DIAGNOSIS — Z7982 Long term (current) use of aspirin: Secondary | ICD-10-CM | POA: Diagnosis not present

## 2018-04-10 DIAGNOSIS — I495 Sick sinus syndrome: Secondary | ICD-10-CM | POA: Diagnosis not present

## 2018-04-10 DIAGNOSIS — R69 Illness, unspecified: Secondary | ICD-10-CM | POA: Diagnosis not present

## 2018-04-10 DIAGNOSIS — E1142 Type 2 diabetes mellitus with diabetic polyneuropathy: Secondary | ICD-10-CM | POA: Diagnosis not present

## 2018-04-10 DIAGNOSIS — I110 Hypertensive heart disease with heart failure: Secondary | ICD-10-CM

## 2018-04-10 DIAGNOSIS — J449 Chronic obstructive pulmonary disease, unspecified: Secondary | ICD-10-CM

## 2018-04-10 DIAGNOSIS — I5042 Chronic combined systolic (congestive) and diastolic (congestive) heart failure: Secondary | ICD-10-CM

## 2018-04-10 DIAGNOSIS — I2510 Atherosclerotic heart disease of native coronary artery without angina pectoris: Secondary | ICD-10-CM | POA: Diagnosis not present

## 2018-04-10 DIAGNOSIS — Z950 Presence of cardiac pacemaker: Secondary | ICD-10-CM

## 2018-04-10 DIAGNOSIS — Z7984 Long term (current) use of oral hypoglycemic drugs: Secondary | ICD-10-CM

## 2018-04-10 DIAGNOSIS — M170 Bilateral primary osteoarthritis of knee: Secondary | ICD-10-CM | POA: Diagnosis not present

## 2018-04-10 DIAGNOSIS — Z87891 Personal history of nicotine dependence: Secondary | ICD-10-CM

## 2018-04-10 DIAGNOSIS — F411 Generalized anxiety disorder: Secondary | ICD-10-CM

## 2018-04-14 ENCOUNTER — Ambulatory Visit: Payer: Medicare HMO | Admitting: Family

## 2018-04-17 ENCOUNTER — Ambulatory Visit: Payer: Medicare HMO | Admitting: Family

## 2018-04-18 ENCOUNTER — Encounter: Payer: Self-pay | Admitting: Family

## 2018-04-18 ENCOUNTER — Ambulatory Visit (INDEPENDENT_AMBULATORY_CARE_PROVIDER_SITE_OTHER): Payer: Medicare HMO | Admitting: Family

## 2018-04-18 ENCOUNTER — Ambulatory Visit: Payer: Medicare HMO | Admitting: Family Medicine

## 2018-04-18 VITALS — BP 135/69 | HR 67 | Temp 97.2°F | Ht 70.0 in | Wt 184.6 lb

## 2018-04-18 DIAGNOSIS — I5032 Chronic diastolic (congestive) heart failure: Secondary | ICD-10-CM

## 2018-04-18 DIAGNOSIS — I509 Heart failure, unspecified: Secondary | ICD-10-CM

## 2018-04-18 DIAGNOSIS — R609 Edema, unspecified: Secondary | ICD-10-CM

## 2018-04-18 LAB — BMP8+EGFR
BUN / CREAT RATIO: 21 (ref 12–28)
BUN: 24 mg/dL (ref 8–27)
CO2: 23 mmol/L (ref 20–29)
Calcium: 8 mg/dL — ABNORMAL LOW (ref 8.7–10.3)
Chloride: 94 mmol/L — ABNORMAL LOW (ref 96–106)
Creatinine, Ser: 1.15 mg/dL — ABNORMAL HIGH (ref 0.57–1.00)
GFR calc Af Amer: 51 mL/min/{1.73_m2} — ABNORMAL LOW (ref >59)
GFR calc non Af Amer: 44 mL/min/{1.73_m2} — ABNORMAL LOW (ref >59)
GLUCOSE: 111 mg/dL — AB (ref 65–99)
Potassium: 4 mmol/L (ref 3.5–5.2)
SODIUM: 135 mmol/L (ref 134–144)

## 2018-04-18 MED ORDER — NYSTATIN-TRIAMCINOLONE 100000-0.1 UNIT/GM-% EX CREA: TOPICAL_CREAM | CUTANEOUS | 4 refills | Status: DC

## 2018-04-18 MED ORDER — TORSEMIDE 20 MG PO TABS: ORAL_TABLET | ORAL | 1 refills | Status: DC

## 2018-04-18 NOTE — Patient Instructions (Signed)
Heart Failure °Heart failure means your heart has trouble pumping blood. This makes it hard for your body to work well. Heart failure is usually a long-term (chronic) condition. You must take good care of yourself and follow your doctor's treatment plan. °Follow these instructions at home: °· Take your heart medicine as told by your doctor. °? Do not stop taking medicine unless your doctor tells you to. °? Do not skip any dose of medicine. °? Refill your medicines before they run out. °? Take other medicines only as told by your doctor or pharmacist. °· Stay active if told by your doctor. The elderly and people with severe heart failure should talk with a doctor about physical activity. °· Eat heart-healthy foods. Choose foods that are without trans fat and are low in saturated fat, cholesterol, and salt (sodium). This includes fresh or frozen fruits and vegetables, fish, lean meats, fat-free or low-fat dairy foods, whole grains, and high-fiber foods. Lentils and dried peas and beans (legumes) are also good choices. °· Limit salt if told by your doctor. °· Cook in a healthy way. Roast, grill, broil, bake, poach, steam, or stir-fry foods. °· Limit fluids as told by your doctor. °· Weigh yourself every morning. Do this after you pee (urinate) and before you eat breakfast. Write down your weight to give to your doctor. °· Take your blood pressure and write it down if your doctor tells you to. °· Ask your doctor how to check your pulse. Check your pulse as told. °· Lose weight if told by your doctor. °· Stop smoking or chewing tobacco. Do not use gum or patches that help you quit without your doctor's approval. °· Schedule and go to doctor visits as told. °· Nonpregnant women should have no more than 1 drink a day. Men should have no more than 2 drinks a day. Talk to your doctor about drinking alcohol. °· Stop illegal drug use. °· Stay current with shots (immunizations). °· Manage your health conditions as told by your  doctor. °· Learn to manage your stress. °· Rest when you are tired. °· If it is really hot outside: °? Avoid intense activities. °? Use air conditioning or fans, or get in a cooler place. °? Avoid caffeine and alcohol. °? Wear loose-fitting, lightweight, and light-colored clothing. °· If it is really cold outside: °? Avoid intense activities. °? Layer your clothing. °? Wear mittens or gloves, a hat, and a scarf when going outside. °? Avoid alcohol. °· Learn about heart failure and get support as needed. °· Get help to maintain or improve your quality of life and your ability to care for yourself as needed. °Contact a doctor if: °· You gain weight quickly. °· You are more short of breath than usual. °· You cannot do your normal activities. °· You tire easily. °· You cough more than normal, especially with activity. °· You have any or more puffiness (swelling) in areas such as your hands, feet, ankles, or belly (abdomen). °· You cannot sleep because it is hard to breathe. °· You feel like your heart is beating fast (palpitations). °· You get dizzy or light-headed when you stand up. °Get help right away if: °· You have trouble breathing. °· There is a change in mental status, such as becoming less alert or not being able to focus. °· You have chest pain or discomfort. °· You faint. °This information is not intended to replace advice given to you by your health care provider. Make sure you   discuss any questions you have with your health care provider. °Document Released: 03/06/2008 Document Revised: 11/03/2015 Document Reviewed: 07/14/2012 °Elsevier Interactive Patient Education © 2017 [employer] ° °

## 2018-04-18 NOTE — Progress Notes (Signed)
   Subjective:    Patient ID: [name] [name], female    DOB: 12/13/1932, 84 y.o.   MRN: [id number]  No chief complaint on file.  Pt presents to the office today for peripheral edema and SOB. Pt has CHF and has been hospitalized several times with this over the last few months. She has not seen a Cardiologists. She has gained 4 lb in the last 2 two days.   PT is currently living at her home by herself.  Congestive Heart Failure  Presents for follow-up visit. Associated symptoms include edema, fatigue, orthopnea, shortness of breath and unexpected weight change. The symptoms have been worsening.      Review of Systems  Constitutional: Positive for fatigue and unexpected weight change.  Respiratory: Positive for shortness of breath.   All other systems reviewed and are negative.      Objective:   Physical Exam  Constitutional: She is oriented to person, place, and time. She appears well-developed and well-nourished. No distress.  HENT:  Head: Normocephalic.  Eyes: Pupils are equal, round, and reactive to light.  Neck: Normal range of motion. Neck supple. No thyromegaly present.  Cardiovascular: Normal rate, regular rhythm, normal heart sounds and intact distal pulses.  No murmur heard. Pulmonary/Chest: Effort normal and breath sounds normal. No accessory muscle usage. No tachypnea. No respiratory distress. She has no wheezes.  SOB present with exertion   Abdominal: Soft. Bowel sounds are normal. She exhibits no distension. There is no tenderness.  Musculoskeletal: She exhibits edema (4+ BLE ). She exhibits no tenderness.  Generalized weakness, using rolling walker  Neurological: She is alert and oriented to person, place, and time. She has normal reflexes. No cranial nerve deficit.  Skin: Skin is warm and dry.  Psychiatric: She has a normal mood and affect. Her behavior is normal. Judgment and thought content normal.  Vitals reviewed.     BP (!) 153/69   Pulse 70   Temp (!)  97.2 F (36.2 C) (Oral)   Ht '5\' 10"'$  (1.778 m)   Wt 184 lb 9.6 oz (83.7 kg)   BMI 26.49 kg/m      Assessment & Plan:  [name] [name] comes in today with chief complaint of No chief complaint on file.   Diagnosis and orders addressed:  1. Chronic diastolic heart failure (HCC) - torsemide (DEMADEX) 20 MG tablet; 40 mg in AM, 40 mg afternoon, 20 mg early evening  Dispense: 150 tablet; Refill: 1 - BMP8+EGFR - Ambulatory referral to Cardiology  2. Acute on chronic congestive heart failure, unspecified heart failure type (HCC) - torsemide (DEMADEX) 20 MG tablet; 40 mg in AM, 40 mg afternoon, 20 mg early evening  Dispense: 150 tablet; Refill: 1 - BMP8+EGFR - Ambulatory referral to Cardiology  3. Peripheral edema - torsemide (DEMADEX) 20 MG tablet; 40 mg in AM, 40 mg afternoon, 20 mg early evening  Dispense: 150 tablet; Refill: 1 - BMP8+EGFR - Ambulatory referral to Cardiology   We will increase Demadex to 40 mg in AM, 40 mg in the afternoon, and 20 mg in the evening from 20 mg TID. Strict low salt diet Labs pending Continue to weight daily Keep legs elevated Long discussion with patient that if SOB or swelling becomes worse to go to ED. RTO on 04/22/18    Evelina Dun, FNP

## 2018-04-22 ENCOUNTER — Ambulatory Visit: Payer: Medicare HMO | Admitting: Family

## 2018-04-25 ENCOUNTER — Other Ambulatory Visit: Payer: Self-pay | Admitting: Family

## 2018-04-25 DIAGNOSIS — G6289 Other specified polyneuropathies: Secondary | ICD-10-CM

## 2018-05-12 ENCOUNTER — Ambulatory Visit (INDEPENDENT_AMBULATORY_CARE_PROVIDER_SITE_OTHER): Payer: Medicare HMO | Admitting: Family

## 2018-05-12 ENCOUNTER — Encounter: Payer: Self-pay | Admitting: Family

## 2018-05-12 VITALS — BP 172/83 | HR 73 | Temp 96.9°F | Ht 70.0 in | Wt 187.0 lb

## 2018-05-12 DIAGNOSIS — R69 Illness, unspecified: Secondary | ICD-10-CM | POA: Diagnosis not present

## 2018-05-12 DIAGNOSIS — E785 Hyperlipidemia, unspecified: Secondary | ICD-10-CM | POA: Diagnosis not present

## 2018-05-12 DIAGNOSIS — K2100 Gastro-esophageal reflux disease with esophagitis, without bleeding: Secondary | ICD-10-CM

## 2018-05-12 DIAGNOSIS — I5032 Chronic diastolic (congestive) heart failure: Secondary | ICD-10-CM

## 2018-05-12 DIAGNOSIS — E1159 Type 2 diabetes mellitus with other circulatory complications: Secondary | ICD-10-CM | POA: Diagnosis not present

## 2018-05-12 DIAGNOSIS — M159 Polyosteoarthritis, unspecified: Secondary | ICD-10-CM

## 2018-05-12 DIAGNOSIS — E1169 Type 2 diabetes mellitus with other specified complication: Secondary | ICD-10-CM | POA: Diagnosis not present

## 2018-05-12 DIAGNOSIS — I152 Hypertension secondary to endocrine disorders: Secondary | ICD-10-CM

## 2018-05-12 DIAGNOSIS — I10 Essential (primary) hypertension: Secondary | ICD-10-CM | POA: Diagnosis not present

## 2018-05-12 DIAGNOSIS — I5043 Acute on chronic combined systolic (congestive) and diastolic (congestive) heart failure: Secondary | ICD-10-CM

## 2018-05-12 DIAGNOSIS — E1143 Type 2 diabetes mellitus with diabetic autonomic (poly)neuropathy: Secondary | ICD-10-CM | POA: Diagnosis not present

## 2018-05-12 DIAGNOSIS — F411 Generalized anxiety disorder: Secondary | ICD-10-CM

## 2018-05-12 DIAGNOSIS — E039 Hypothyroidism, unspecified: Secondary | ICD-10-CM

## 2018-05-12 DIAGNOSIS — I5042 Chronic combined systolic (congestive) and diastolic (congestive) heart failure: Secondary | ICD-10-CM | POA: Diagnosis not present

## 2018-05-12 LAB — BAYER DCA HB A1C WAIVED: HB A1C (BAYER DCA - WAIVED): 6.5 % (ref <7.0)

## 2018-05-12 MED ORDER — TRAMADOL HCL 50 MG PO TABS: 50.0000 mg | ORAL_TABLET | Freq: Two times a day (BID) | ORAL | 1 refills | Status: DC | PRN

## 2018-05-12 MED ORDER — OMEPRAZOLE 20 MG PO CPDR: 20.0000 mg | DELAYED_RELEASE_CAPSULE | Freq: Every day | ORAL | 3 refills | Status: DC

## 2018-05-12 MED ORDER — LISINOPRIL 20 MG PO TABS: 20.0000 mg | ORAL_TABLET | Freq: Every day | ORAL | 3 refills | Status: DC

## 2018-05-12 NOTE — Progress Notes (Signed)
Subjective:    Patient ID: [name] [name], female    DOB: [birth year], 84 y.o.   MRN: [id number]  Chief Complaint  Patient presents with  . chronic health recheck   Pt presents to the office today for chronic follow up. Pt is unsure of all of her medications she is taking. Her BP is elevated today. Her neighbor is with her today and states she has started helping with her care and medications since her [name]'s husband passed away.   She is has follow up with Cardiologists 05/15/18 CHF Diabetes  She presents for her follow-up diabetic visit. She has type 2 diabetes mellitus. Her disease course has been stable. Hypoglycemia symptoms include nervousness/anxiousness. Associated symptoms include fatigue. Pertinent negatives for diabetes include no blurred vision and no foot paresthesias. There are no hypoglycemic complications. Symptoms are stable. Diabetic complications include heart disease. Risk factors for coronary artery disease include dyslipidemia, diabetes mellitus, hypertension and sedentary lifestyle. She is following a generally healthy diet. Her overall blood glucose range is 110-130 mg/dl.  Hypertension  This is a chronic problem. The current episode started more than 1 year ago. The problem has been waxing and waning since onset. The problem is uncontrolled. Associated symptoms include anxiety, malaise/fatigue and peripheral edema. Pertinent negatives include no blurred vision. Risk factors for coronary artery disease include diabetes mellitus, obesity and sedentary lifestyle. The current treatment provides moderate improvement. Hypertensive end-organ damage includes kidney disease and CAD/MI. Identifiable causes of hypertension include a thyroid problem.  Thyroid Problem  Presents for follow-up visit. Symptoms include anxiety, depressed mood, fatigue and hoarse voice. The symptoms have been stable. Her past medical history is significant for hyperlipidemia.  Hyperlipidemia  This is a  chronic problem. The current episode started more than 1 year ago. The problem is controlled. Current antihyperlipidemic treatment includes statins. The current treatment provides moderate improvement of lipids. Risk factors for coronary artery disease include dyslipidemia, hypertension, a sedentary lifestyle and post-menopausal.  Arthritis  Presents for follow-up visit. She complains of pain and stiffness. The symptoms have been stable. Affected locations include the right knee and left knee (back). Associated symptoms include fatigue.  Anxiety  Presents for follow-up visit. Symptoms include depressed mood, excessive worry, irritability and nervous/anxious behavior. Symptoms occur occasionally. The severity of symptoms is moderate.    Gastroesophageal Reflux  She complains of belching, heartburn and a hoarse voice. This is a chronic problem. The current episode started more than 1 month ago. The problem occurs constantly. The problem has been waxing and waning. The symptoms are aggravated by certain foods. Associated symptoms include fatigue. Risk factors include obesity. She has tried nothing for the symptoms. The treatment provided mild relief.      Review of Systems  Constitutional: Positive for fatigue, irritability and malaise/fatigue.  HENT: Positive for hoarse voice.   Eyes: Negative for blurred vision.  Gastrointestinal: Positive for heartburn.  Musculoskeletal: Positive for arthralgias, arthritis and stiffness.  Psychiatric/Behavioral: The patient is nervous/anxious.   All other systems reviewed and are negative.      Objective:   Physical Exam  Constitutional: She is oriented to person, place, and time. She appears well-developed and well-nourished. No distress.  HENT:  Head: Normocephalic and atraumatic.  Right Ear: External ear normal.  Left Ear: External ear normal.  Mouth/Throat: Oropharynx is clear and moist.  Eyes: Pupils are equal, round, and reactive to light.    Neck: Normal range of motion. Neck supple. No thyromegaly present.  Cardiovascular: Normal rate,  regular rhythm and intact distal pulses.  Murmur heard. Pulmonary/Chest: Effort normal and breath sounds normal. No respiratory distress. She has no wheezes.  Abdominal: Soft. Bowel sounds are normal. She exhibits no distension. There is no tenderness.  Musculoskeletal: Normal range of motion. She exhibits edema (3+ BLE). She exhibits no tenderness.  Neurological: She is alert and oriented to person, place, and time. She has normal reflexes. No cranial nerve deficit.  Skin: Skin is warm and dry.  Psychiatric: She has a normal mood and affect. Her behavior is normal. Judgment and thought content normal.  Vitals reviewed.    BP (!) 172/83   Pulse 73   Temp (!) 96.9 F (36.1 C) (Oral)   Ht _0  (1.778 m)   Wt 187 lb (84.8 kg)   BMI 26.83 kg/m      Assessment & Plan:  [name] [name] comes in today with chief complaint of chronic health recheck   Diagnosis and orders addressed:  1. Hypertension associated with diabetes ([place]) We will increase Lisinopril 20 mg from 10 mg  - CMP14+EGFR - CBC with Differential/Platelet - lisinopril (PRINIVIL,ZESTRIL) 20 MG tablet; Take 1 tablet (20 mg total) by mouth daily.  Dispense: 90 tablet; Refill: 3  2. Chronic diastolic heart failure ([place]) Keep Cardiologists appt!! Weigh daily Continue Demadex - CMP14+EGFR - CBC with Differential/Platelet  3. Acute on chronic combined systolic and diastolic congestive heart failure (HCC) - CMP14+EGFR - CBC with Differential/Platelet  4. Hypothyroidism, unspecified type - CMP14+EGFR - CBC with Differential/Platelet - TSH  5. Hyperlipidemia associated with type 2 diabetes mellitus (HCC) - CMP14+EGFR - CBC with Differential/Platelet - Lipid panel  6. Type II diabetes mellitus with peripheral autonomic neuropathy (HCC) - CMP14+EGFR - CBC with Differential/Platelet - Bayer DCA Hb A1c Waived  7.  Morbid obesity ([place]) - CMP14+EGFR - CBC with Differential/Platelet  8. GAD (generalized anxiety disorder) - CMP14+EGFR - CBC with Differential/Platelet  9. Chronic combined systolic (congestive) and diastolic (congestive) heart failure (HCC) - CMP14+EGFR - CBC with Differential/Platelet  10. Gastroesophageal reflux disease with esophagitis --Diet discussed- Avoid fried, spicy, citrus foods, caffeine and alcohol -Do not eat 2-3 hours before bedtime -Encouraged small frequent meals -Avoid NSAID's - CMP14+EGFR - CBC with Differential/Platelet - omeprazole (PRILOSEC) 20 MG capsule; Take 1 capsule (20 mg total) by mouth daily.  Dispense: 90 capsule; Refill: 3  11. Osteoarthritis of multiple joints, unspecified osteoarthritis type We will start Ultram today. She has not taken xanax regularly and wishes to stop these so she can have medication for pain.  Fall prevention discussed - traMADol (ULTRAM) 50 MG tablet; Take 1-2 tablets (50-100 mg total) by mouth every 12 (twelve) hours as needed.  Dispense: 80 tablet; Refill: 1   Labs pending Health Maintenance reviewed Diet and exercise encouraged  Follow up plan: 1-2 weeks and bring all medications and bottles!!!!!  Evelina Dun, FNP

## 2018-05-12 NOTE — Patient Instructions (Signed)

## 2018-05-13 ENCOUNTER — Other Ambulatory Visit: Payer: Self-pay | Admitting: Family

## 2018-05-13 LAB — CMP14+EGFR
A/G RATIO: 1.6 (ref 1.2–2.2)
ALT: 28 IU/L (ref 0–32)
AST: 40 IU/L (ref 0–40)
Albumin: 3.6 g/dL (ref 3.5–4.7)
Alkaline Phosphatase: 88 IU/L (ref 39–117)
BILIRUBIN TOTAL: 0.4 mg/dL (ref 0.0–1.2)
BUN/Creatinine Ratio: 22 (ref 12–28)
BUN: 22 mg/dL (ref 8–27)
CALCIUM: 9 mg/dL (ref 8.7–10.3)
CHLORIDE: 93 mmol/L — AB (ref 96–106)
CO2: 25 mmol/L (ref 20–29)
Creatinine, Ser: 1.02 mg/dL — ABNORMAL HIGH (ref 0.57–1.00)
GFR, EST AFRICAN AMERICAN: 59 mL/min/{1.73_m2} — AB (ref >59)
GFR, EST NON AFRICAN AMERICAN: 51 mL/min/{1.73_m2} — AB (ref >59)
GLOBULIN, TOTAL: 2.3 g/dL (ref 1.5–4.5)
Glucose: 102 mg/dL — ABNORMAL HIGH (ref 65–99)
Potassium: 4 mmol/L (ref 3.5–5.2)
SODIUM: 137 mmol/L (ref 134–144)
TOTAL PROTEIN: 5.9 g/dL — AB (ref 6.0–8.5)

## 2018-05-13 LAB — CBC WITH DIFFERENTIAL/PLATELET
BASOS ABS: 0 10*3/uL (ref 0.0–0.2)
BASOS: 1 %
EOS (ABSOLUTE): 0.1 10*3/uL (ref 0.0–0.4)
Eos: 2 %
Hematocrit: 31.5 % — ABNORMAL LOW (ref 34.0–46.6)
Hemoglobin: 9.7 g/dL — ABNORMAL LOW (ref 11.1–15.9)
Immature Grans (Abs): 0 10*3/uL (ref 0.0–0.1)
Immature Granulocytes: 0 %
LYMPHS ABS: 1.5 10*3/uL (ref 0.7–3.1)
Lymphs: 22 %
MCH: 26.9 pg (ref 26.6–33.0)
MCHC: 30.8 g/dL — AB (ref 31.5–35.7)
MCV: 87 fL (ref 79–97)
MONOS ABS: 0.6 10*3/uL (ref 0.1–0.9)
Monocytes: 9 %
NEUTROS ABS: 4.4 10*3/uL (ref 1.4–7.0)
Neutrophils: 66 %
PLATELETS: 217 10*3/uL (ref 150–450)
RBC: 3.61 x10E6/uL — ABNORMAL LOW (ref 3.77–5.28)
RDW: 14.7 % (ref 12.3–15.4)
WBC: 6.6 10*3/uL (ref 3.4–10.8)

## 2018-05-13 LAB — LIPID PANEL
CHOL/HDL RATIO: 2.1 ratio (ref 0.0–4.4)
Cholesterol, Total: 124 mg/dL (ref 100–199)
HDL: 58 mg/dL (ref >39)
LDL Calculated: 55 mg/dL (ref 0–99)
TRIGLYCERIDES: 54 mg/dL (ref 0–149)
VLDL Cholesterol Cal: 11 mg/dL (ref 5–40)

## 2018-05-13 LAB — TSH: TSH: 8.58 u[IU]/mL — AB (ref 0.450–4.500)

## 2018-05-13 MED ORDER — LEVOTHYROXINE SODIUM 200 MCG PO TABS: ORAL_TABLET | ORAL | 3 refills | Status: AC

## 2018-05-15 ENCOUNTER — Encounter: Payer: Self-pay | Admitting: Internal Medicine

## 2018-05-15 ENCOUNTER — Ambulatory Visit (INDEPENDENT_AMBULATORY_CARE_PROVIDER_SITE_OTHER): Payer: Medicare HMO | Admitting: Internal Medicine

## 2018-05-15 VITALS — BP 176/98 | HR 82 | Ht 70.0 in | Wt 185.2 lb

## 2018-05-15 DIAGNOSIS — I495 Sick sinus syndrome: Secondary | ICD-10-CM | POA: Diagnosis not present

## 2018-05-15 DIAGNOSIS — I5042 Chronic combined systolic (congestive) and diastolic (congestive) heart failure: Secondary | ICD-10-CM | POA: Diagnosis not present

## 2018-05-15 LAB — CUP PACEART INCLINIC DEVICE CHECK
Brady Statistic AP VS Percent: 0 %
Brady Statistic AS VP Percent: 41 %
Implantable Lead Implant Date: 20030716
Implantable Lead Location: 753859
Implantable Lead Model: 5076
Lead Channel Pacing Threshold Amplitude: 1 V
Lead Channel Pacing Threshold Amplitude: 1 V
Lead Channel Pacing Threshold Amplitude: 1.125 V
Lead Channel Pacing Threshold Pulse Width: 0.4 ms
Lead Channel Pacing Threshold Pulse Width: 0.4 ms
Lead Channel Sensing Intrinsic Amplitude: 0.5 mV
Lead Channel Sensing Intrinsic Amplitude: 8 mV
Lead Channel Setting Pacing Amplitude: 2 V
Lead Channel Setting Pacing Amplitude: 2.5 V
Lead Channel Setting Sensing Sensitivity: 2.8 mV
MDC IDC LEAD IMPLANT DT: 20030716
MDC IDC LEAD LOCATION: 753860
MDC IDC MSMT BATTERY IMPEDANCE: 2168 Ohm
MDC IDC MSMT BATTERY REMAINING LONGEVITY: 26 mo
MDC IDC MSMT BATTERY VOLTAGE: 2.73 V
MDC IDC MSMT LEADCHNL RA IMPEDANCE VALUE: 404 Ohm
MDC IDC MSMT LEADCHNL RA PACING THRESHOLD AMPLITUDE: 0.5 V
MDC IDC MSMT LEADCHNL RA PACING THRESHOLD PULSEWIDTH: 0.4 ms
MDC IDC MSMT LEADCHNL RV IMPEDANCE VALUE: 610 Ohm
MDC IDC MSMT LEADCHNL RV PACING THRESHOLD PULSEWIDTH: 0.4 ms
MDC IDC PG IMPLANT DT: 20120628
MDC IDC SESS DTM: 20191205110428
MDC IDC SET LEADCHNL RV PACING PULSEWIDTH: 0.4 ms
MDC IDC STAT BRADY AP VP PERCENT: 59 %
MDC IDC STAT BRADY AS VS PERCENT: 0 %

## 2018-05-15 MED ORDER — CARVEDILOL 12.5 MG PO TABS: 12.5000 mg | ORAL_TABLET | Freq: Two times a day (BID) | ORAL | 3 refills | Status: DC

## 2018-05-15 NOTE — Progress Notes (Signed)
HPI [name] [name] returns today for followup. She is a pleasant elderly woman, with a h/o symptomatic bradycardia due to CHB, who underwent PPM years ago. Since then, she has done fairly well. She denies chest pain. No edema. She is sleeping well though she gets up a couple of times at night. No hospitaliations. She notes that her bp has been up and her legs more swollen. She denies dietary or medical non-compliance.  Allergies  Allergen Reactions  . Penicillins     Has patient had a PCN reaction causing immediate rash, facial/tongue/throat swelling, SOB or lightheadedness with hypotension: Unknown Has patient had a PCN reaction causing severe rash involving mucus membranes or skin necrosis: Unknown Has patient had a PCN reaction that required hospitalization: Unknown Has patient had a PCN reaction occurring within the last 10 years: No If all of the above answers are "NO", then may proceed with Cephalosporin use.      Current Outpatient Medications  Medication Sig Dispense Refill  . acetaminophen (TYLENOL) 325 MG tablet Take 2 tablets (650 mg total) by mouth every 6 (six) hours as needed for mild pain (or Fever >/= 101). 12 tablet 0  . aspirin EC 81 MG tablet Take 81 mg by mouth daily. Reported on 12/14/2015    . BREO ELLIPTA 100-25 MCG/INH AEPB     . gabapentin (NEURONTIN) 300 MG capsule TAKE (1) CAPSULE THREE TIMES DAILY. 90 capsule 2  . glucose blood (GLUCOSE METER TEST) test strip Use BID 100 each 12  . levothyroxine (SYNTHROID, LEVOTHROID) 200 MCG tablet TAKE (1) TABLET DAILY BE- FORE BREAKFAST. 90 tablet 3  . lisinopril (PRINIVIL,ZESTRIL) 20 MG tablet Take 1 tablet (20 mg total) by mouth daily. 90 tablet 3  . metFORMIN (GLUCOPHAGE) 1000 MG tablet TAKE (1) TABLET TWICE A DAY WITH MEALS (BREAKFAST AND SUPPER) FOR DIAB ETES 180 tablet 0  . metoprolol succinate (TOPROL-XL) 25 MG 24 hr tablet TAKE (1) TABLET DAILY WITH FOOD. 90 tablet 1  . nitroGLYCERIN (NITROLINGUAL) 0.4 MG/SPRAY  spray Place 1 spray under the tongue every 5 (five) minutes as needed. 12 g 2  . nystatin-triamcinolone (MYCOLOG II) cream APPLY TO AFFECTED AREA 3 TIMES A DAY 60 g 4  . omeprazole (PRILOSEC) 20 MG capsule Take 1 capsule (20 mg total) by mouth daily. 90 capsule 3  . pravastatin (PRAVACHOL) 80 MG tablet TAKE 1 TABLET ONCE DAILY FOR CHOLESTEROL 90 tablet 0  . PROAIR HFA 108 (90 Base) MCG/ACT inhaler 2 PUFFS EVERY 6 HOURS AS NEEDED FOR WHEEZING 25.5 g 4  . sertraline (ZOLOFT) 50 MG tablet TAKE 1 TABLET DAILY 90 tablet 0  . tamsulosin (FLOMAX) 0.4 MG CAPS capsule Take 1 capsule (0.4 mg total) by mouth daily after supper. 30 capsule 1  . torsemide (DEMADEX) 20 MG tablet 40 mg in AM, 40 mg afternoon, 20 mg early evening 150 tablet 1  . traMADol (ULTRAM) 50 MG tablet Take 1-2 tablets (50-100 mg total) by mouth every 12 (twelve) hours as needed. 80 tablet 1   No current facility-administered medications for this visit.      Past Medical History:  Diagnosis Date  . ANXIETY   . Arteriosclerotic cardiovascular disease (ASCVD)    Cath in 9/99-70% mid LAD with diffuse distal disease, 70% T1, 60% mid circumflex, 50% mid RCA, normal ejection fraction; negative stress nuclear in 07/2008  . Carpal tunnel syndrome   . Cerebrovascular disease    carotid bruits; no focal disease in 1999, 2002 and 2006  .  Closed left fibular fracture 11/30/11  . COPD   . Diabetes mellitus    Insulin treatment  . Diastolic CHF, chronic (HCC)    Normal EF  . Gastroesophageal reflux disease    With hiatal hernia; irritable bowel syndrome; H. pylori-treated; Colonoscopy 2008: non-specific colitis, IH, Diverticulosis, Rectal ulcer secondary to ASA  . HOH (hard of hearing)   . Hyperlipidemia   . Hypertension   . Hypothyroidism   . OSTEOARTHRITIS, KNEES, BILATERAL    Bilateral TKA  . Pacemaker   . Peripheral neuropathy   . Sinoatrial node dysfunction (HCC) 2003   Medtronic dual-chamber device  . Sleep apnea    pt could  not tolerate  . VITAMIN B12 DEFICIENCY     ROS:   All systems reviewed and negative except as noted in the HPI.   Past Surgical History:  Procedure Laterality Date  . ABDOMINAL HYSTERECTOMY    . BLADDER REPAIR    . CARDIAC PACEMAKER PLACEMENT  2003   Medtronic, dual-chamber  . CARPAL TUNNEL RELEASE    . CATARACT EXTRACTION W/PHACO Left 08/09/2017   Procedure: CATARACT EXTRACTION PHACO AND INTRAOCULAR LENS PLACEMENT (IOC);  Surgeon: Fabio PierceWrzosek, James, MD;  Location: [facility];  Service: Ophthalmology;  Laterality: Left;  CDE: 6.26  . CHOLECYSTECTOMY    . COLONOSCOPY  2008  . HEMORRHOID SURGERY    . NASAL FRACTURE SURGERY    . THYROIDECTOMY  1980   Goiter  . TOTAL KNEE ARTHROPLASTY     Bilateral     Family History  Problem Relation Age of Onset  . COPD Father   . Heart attack Father [id number]  . Heart failure Mother   . Cancer Sister        colon  . Cancer Brother        lung  . Cancer Sister        bladder  . Cancer Brother        lung  . Early death Son   . Diabetes Son   . Heart disease Son   . Cancer Son        prostate  . Cancer Sister        kidney cancer  . Diabetes Sister   . Cancer Daughter      Social History   Socioeconomic History  . Marital status: Widowed    Spouse name: husband has dementia  . Number of children: Not on file  . Years of education: 8  . Highest education level: Not on file  Occupational History  . Occupation: retired    Associate Professormployer: RETIRED  Social Needs  . Financial resource strain: Not on file  . Food insecurity:    Worry: Not on file    Inability: Not on file  . Transportation needs:    Medical: Not on file    Non-medical: Not on file  Tobacco Use  . Smoking status: Never Smoker  . Smokeless tobacco: Never Used  Substance and Sexual Activity  . Alcohol use: No    Alcohol/week: 0.0 standard drinks  . Drug use: No  . Sexual activity: Not Currently  Lifestyle  . Physical activity:    Days per week: Not on file    Minutes  per session: Not on file  . Stress: Not on file  Relationships  . Social connections:    Talks on phone: Not on file    Gets together: Not on file    Attends religious service: Not on file    Active  member of club or organization: Not on file    Attends meetings of clubs or organizations: Not on file    Relationship status: Not on file  . Intimate partner violence:    Fear of current or ex partner: Not on file    Emotionally abused: Not on file    Physically abused: Not on file    Forced sexual activity: Not on file  Other Topics Concern  . Not on file  Social History Narrative  . Not on file     BP (!) 176/98   Pulse 82   Ht 5\' 10"  (1.778 m)   Wt 185 lb 3.2 oz (84 kg)   SpO2 91% Comment: on room air  BMI 26.57 kg/m   Physical Exam:  Well appearing NAD HEENT: Unremarkable Neck:  No JVD, no thyromegally Lymphatics:  No adenopathy Back:  No CVA tenderness Lungs:  Clear with no wheezes HEART:  Regular rate rhythm, no murmurs, no rubs, no clicks Abd:  soft, positive bowel sounds, no organomegally, no rebound, no guarding Ext:  2 plus pulses, no edema, no cyanosis, no clubbing Skin:  No rashes no nodules Neuro:  CN II through XII intact, motor grossly intact  DEVICE  Normal device function.  See PaceArt for details.   Assess/Plan: 1. Venous insufficiency -we will provide a script for support stocking. I have encouraged her to keep her legs elevated and reduce the salt content in her diet. 2. PPM - her medtronic DDD PM is working normally.  3. HTN - her pressures are not controlled. I have asked her to stop the toprol and start coreg 12.5 bid. 4. CAD - she denies anginal symptoms. She will continue her current meds.   Leonia Reeves.D.

## 2018-05-15 NOTE — Patient Instructions (Addendum)
Your physician wants you to follow-up in: 1 year with Dr.Taylor You will receive a reminder letter in the mail two months in advance. If you don't receive a letter, please call our office to schedule the follow-up appointment.    Remote monitoring is used to monitor your Pacemaker of ICD from home. This monitoring reduces the number of office visits required to check your device to one time per year. It allows us to keep an eye on the functioning of your device to ensure it is working properly. You are scheduled for a device check from home on (6 months). You may send your transmission at any time that day. If you have a wireless device, the transmission will be sent automatically. After your physician reviews your transmission, you will receive a postcard with your next transmission date.   STOP Toprol   START Coreg 12.5 mg twice a day   Use support stockings as directed  No labs or test today      Thank you for choosing [facility] !

## 2018-05-21 ENCOUNTER — Other Ambulatory Visit: Payer: Self-pay | Admitting: *Deleted

## 2018-05-21 MED ORDER — GABAPENTIN 300 MG PO CAPS: ORAL_CAPSULE | ORAL | 2 refills | Status: DC

## 2018-05-26 ENCOUNTER — Ambulatory Visit: Payer: Medicare HMO | Admitting: Family

## 2018-05-29 ENCOUNTER — Telehealth: Payer: Self-pay | Admitting: *Deleted

## 2018-05-29 NOTE — Telephone Encounter (Signed)
Several attempts to contact pt to schedule appt for evaluation for confusion. family members contacted office stating pt is having delusional episodes  Left several messages for pt to contact office to schedule

## 2018-05-30 ENCOUNTER — Other Ambulatory Visit: Payer: Self-pay | Admitting: Family

## 2018-05-30 DIAGNOSIS — F411 Generalized anxiety disorder: Secondary | ICD-10-CM

## 2018-06-01 DIAGNOSIS — R52 Pain, unspecified: Secondary | ICD-10-CM | POA: Diagnosis not present

## 2018-06-01 DIAGNOSIS — I5023 Acute on chronic systolic (congestive) heart failure: Secondary | ICD-10-CM | POA: Diagnosis not present

## 2018-06-01 DIAGNOSIS — R609 Edema, unspecified: Secondary | ICD-10-CM | POA: Diagnosis not present

## 2018-06-01 DIAGNOSIS — I5021 Acute systolic (congestive) heart failure: Secondary | ICD-10-CM | POA: Diagnosis not present

## 2018-06-01 DIAGNOSIS — N390 Urinary tract infection, site not specified: Secondary | ICD-10-CM | POA: Diagnosis not present

## 2018-06-01 DIAGNOSIS — R0602 Shortness of breath: Secondary | ICD-10-CM | POA: Diagnosis not present

## 2018-06-01 DIAGNOSIS — R410 Disorientation, unspecified: Secondary | ICD-10-CM | POA: Diagnosis not present

## 2018-06-01 DIAGNOSIS — R0902 Hypoxemia: Secondary | ICD-10-CM | POA: Diagnosis not present

## 2018-06-01 DIAGNOSIS — Z7984 Long term (current) use of oral hypoglycemic drugs: Secondary | ICD-10-CM | POA: Diagnosis not present

## 2018-06-01 DIAGNOSIS — Z23 Encounter for immunization: Secondary | ICD-10-CM | POA: Diagnosis not present

## 2018-06-01 DIAGNOSIS — S82402B Unspecified fracture of shaft of left fibula, initial encounter for open fracture type I or II: Secondary | ICD-10-CM | POA: Diagnosis not present

## 2018-06-01 DIAGNOSIS — E1140 Type 2 diabetes mellitus with diabetic neuropathy, unspecified: Secondary | ICD-10-CM | POA: Diagnosis not present

## 2018-06-01 DIAGNOSIS — E039 Hypothyroidism, unspecified: Secondary | ICD-10-CM | POA: Diagnosis not present

## 2018-06-01 DIAGNOSIS — I872 Venous insufficiency (chronic) (peripheral): Secondary | ICD-10-CM | POA: Diagnosis not present

## 2018-06-01 DIAGNOSIS — I509 Heart failure, unspecified: Secondary | ICD-10-CM | POA: Diagnosis not present

## 2018-06-01 DIAGNOSIS — I10 Essential (primary) hypertension: Secondary | ICD-10-CM | POA: Diagnosis not present

## 2018-06-01 DIAGNOSIS — L03116 Cellulitis of left lower limb: Secondary | ICD-10-CM | POA: Diagnosis not present

## 2018-06-01 DIAGNOSIS — L97921 Non-pressure chronic ulcer of unspecified part of left lower leg limited to breakdown of skin: Secondary | ICD-10-CM | POA: Diagnosis not present

## 2018-06-01 DIAGNOSIS — R319 Hematuria, unspecified: Secondary | ICD-10-CM | POA: Diagnosis not present

## 2018-06-01 DIAGNOSIS — I110 Hypertensive heart disease with heart failure: Secondary | ICD-10-CM | POA: Diagnosis not present

## 2018-06-01 DIAGNOSIS — E785 Hyperlipidemia, unspecified: Secondary | ICD-10-CM | POA: Diagnosis not present

## 2018-06-01 DIAGNOSIS — L97829 Non-pressure chronic ulcer of other part of left lower leg with unspecified severity: Secondary | ICD-10-CM | POA: Diagnosis not present

## 2018-06-01 DIAGNOSIS — E119 Type 2 diabetes mellitus without complications: Secondary | ICD-10-CM | POA: Diagnosis not present

## 2018-06-01 DIAGNOSIS — R079 Chest pain, unspecified: Secondary | ICD-10-CM | POA: Diagnosis not present

## 2018-06-01 DIAGNOSIS — B9620 Unspecified Escherichia coli [E. coli] as the cause of diseases classified elsewhere: Secondary | ICD-10-CM | POA: Diagnosis not present

## 2018-06-01 DIAGNOSIS — I5020 Unspecified systolic (congestive) heart failure: Secondary | ICD-10-CM | POA: Diagnosis not present

## 2018-06-01 DIAGNOSIS — L03115 Cellulitis of right lower limb: Secondary | ICD-10-CM | POA: Diagnosis not present

## 2018-06-09 ENCOUNTER — Telehealth: Payer: Self-pay | Admitting: Family

## 2018-06-09 NOTE — Telephone Encounter (Signed)
Appt made per patients family request. She remains weak. Advised family that if she gets any worse to return to hospital. Family verbalized understanding.

## 2018-06-12 ENCOUNTER — Ambulatory Visit: Payer: Medicare HMO | Admitting: Family Medicine

## 2018-06-13 ENCOUNTER — Encounter: Payer: Self-pay | Admitting: Family

## 2018-06-13 DIAGNOSIS — R5381 Other malaise: Secondary | ICD-10-CM | POA: Diagnosis not present

## 2018-06-16 ENCOUNTER — Other Ambulatory Visit: Payer: Self-pay | Admitting: Family

## 2018-06-16 DIAGNOSIS — F411 Generalized anxiety disorder: Secondary | ICD-10-CM

## 2018-06-21 DIAGNOSIS — I10 Essential (primary) hypertension: Secondary | ICD-10-CM | POA: Diagnosis not present

## 2018-06-21 DIAGNOSIS — R410 Disorientation, unspecified: Secondary | ICD-10-CM | POA: Diagnosis not present

## 2018-06-21 DIAGNOSIS — R404 Transient alteration of awareness: Secondary | ICD-10-CM | POA: Diagnosis not present

## 2018-06-21 DIAGNOSIS — Z0489 Encounter for examination and observation for other specified reasons: Secondary | ICD-10-CM | POA: Diagnosis not present

## 2018-06-21 DIAGNOSIS — E1165 Type 2 diabetes mellitus with hyperglycemia: Secondary | ICD-10-CM | POA: Diagnosis not present

## 2018-06-21 DIAGNOSIS — R69 Illness, unspecified: Secondary | ICD-10-CM | POA: Diagnosis not present

## 2018-06-23 ENCOUNTER — Telehealth: Payer: Self-pay | Admitting: Family

## 2018-06-24 NOTE — Telephone Encounter (Signed)
Cathy, do you know anything about this? 

## 2018-06-24 NOTE — Telephone Encounter (Signed)
Form filled out, placed on providers desk

## 2018-06-25 ENCOUNTER — Encounter: Payer: Self-pay | Admitting: Family

## 2018-06-25 ENCOUNTER — Ambulatory Visit (INDEPENDENT_AMBULATORY_CARE_PROVIDER_SITE_OTHER): Payer: Medicare HMO | Admitting: Family

## 2018-06-25 VITALS — BP 101/45 | HR 65 | Temp 96.5°F | Ht 70.0 in | Wt 186.0 lb

## 2018-06-25 DIAGNOSIS — M159 Polyosteoarthritis, unspecified: Secondary | ICD-10-CM | POA: Diagnosis not present

## 2018-06-25 DIAGNOSIS — E1143 Type 2 diabetes mellitus with diabetic autonomic (poly)neuropathy: Secondary | ICD-10-CM

## 2018-06-25 DIAGNOSIS — R410 Disorientation, unspecified: Secondary | ICD-10-CM

## 2018-06-25 DIAGNOSIS — L03119 Cellulitis of unspecified part of limb: Secondary | ICD-10-CM | POA: Diagnosis not present

## 2018-06-25 DIAGNOSIS — N3001 Acute cystitis with hematuria: Secondary | ICD-10-CM

## 2018-06-25 DIAGNOSIS — R350 Frequency of micturition: Secondary | ICD-10-CM | POA: Diagnosis not present

## 2018-06-25 DIAGNOSIS — R531 Weakness: Secondary | ICD-10-CM

## 2018-06-25 DIAGNOSIS — I83009 Varicose veins of unspecified lower extremity with ulcer of unspecified site: Secondary | ICD-10-CM

## 2018-06-25 DIAGNOSIS — L97909 Non-pressure chronic ulcer of unspecified part of unspecified lower leg with unspecified severity: Secondary | ICD-10-CM | POA: Diagnosis not present

## 2018-06-25 LAB — BAYER DCA HB A1C WAIVED: HB A1C: 7.1 % — AB (ref <7.0)

## 2018-06-25 LAB — URINALYSIS, COMPLETE
BILIRUBIN UA: NEGATIVE
Glucose, UA: NEGATIVE
Ketones, UA: NEGATIVE
Nitrite, UA: NEGATIVE
Protein, UA: NEGATIVE
Specific Gravity, UA: 1.015 (ref 1.005–1.030)
UUROB: 0.2 mg/dL (ref 0.2–1.0)
pH, UA: 6.5 (ref 5.0–7.5)

## 2018-06-25 LAB — MICROSCOPIC EXAMINATION

## 2018-06-25 MED ORDER — SULFAMETHOXAZOLE-TRIMETHOPRIM 800-160 MG PO TABS: 1.0000 | ORAL_TABLET | Freq: Two times a day (BID) | ORAL | 0 refills | Status: DC

## 2018-06-25 MED ORDER — TRAMADOL HCL 50 MG PO TABS: 50.0000 mg | ORAL_TABLET | Freq: Two times a day (BID) | ORAL | 1 refills | Status: AC | PRN

## 2018-06-25 NOTE — Progress Notes (Signed)
Subjective:    Patient ID: [name] [name], female    DOB: 03-19-1933, 85 y.o.   MRN: [id number]  Chief Complaint  Patient presents with  . Medical Management of Chronic Issues    three month recheck  . swollen,red lower leg and feet  . Urinary Incontinence   PT presents to the office today for chronic follow up. She is having confusion and her daughter is present. Pt was calling the police 3-4 times a day and her confusion is worse at night. She is weak and her daughter states she can not care for her at home any more.  Urinary Frequency   This is a recurrent problem. The current episode started in the past 7 days. The problem occurs intermittently. The problem has been waxing and waning. The pain is at a severity of 0/10. The patient is experiencing no pain. Associated symptoms include frequency and urgency.  Diabetes  She presents for her follow-up diabetic visit. She has type 2 diabetes mellitus. Associated symptoms include blurred vision and weakness. Symptoms are worsening.  Cellulites  Pt having redness in bilateral legs and pain. She has wounds on bilateral distal aspects of her lower legs that are draining a clear discharge. Pt was referred to wound clinic, but the daughter was unaware of her appointments and has not been taking.    Review of Systems  Eyes: Positive for blurred vision.  Genitourinary: Positive for frequency and urgency.  Skin: Positive for wound.  Neurological: Positive for weakness.  All other systems reviewed and are negative.      Objective:   Physical Exam Vitals signs reviewed.  Constitutional:      General: She is not in acute distress.    Appearance: She is well-developed. She is ill-appearing.     Comments: Weakness present  Eyes:     Pupils: Pupils are equal, round, and reactive to light.  Neck:     Musculoskeletal: Normal range of motion and neck supple.     Thyroid: No thyromegaly.  Cardiovascular:     Rate and Rhythm: Normal rate and  regular rhythm.     Heart sounds: Normal heart sounds. No murmur.  Pulmonary:     Effort: Pulmonary effort is normal. No respiratory distress.     Breath sounds: Decreased breath sounds present. No wheezing.  Abdominal:     General: Bowel sounds are normal. There is no distension.     Palpations: Abdomen is soft.     Tenderness: There is no abdominal tenderness.  Musculoskeletal: Normal range of motion.        General: No tenderness.  Skin:    General: Skin is warm and dry.     Findings: Erythema present.     Comments: Venous ulcer on left lower leg that is approx 3X2 cm , erythemas of bilateral feet. Tenderness and pain with palpation.   Neurological:     Mental Status: She is oriented to person, place, and time.     Cranial Nerves: No cranial nerve deficit.     Deep Tendon Reflexes: Reflexes are normal and symmetric.  Psychiatric:        Behavior: Behavior normal.        Thought Content: Thought content normal.        Judgment: Judgment normal.         BP (!) 101/45   Pulse 65   Temp (!) 96.5 F (35.8 C) (Oral)   Ht '5\' 10"'  (1.778 m)   Wt 186  lb (84.4 kg)   BMI 26.69 kg/m      Assessment & Plan:  [name] [name] comes in today with chief complaint of Medical Management of Chronic Issues (three month recheck); swollen,red lower leg and feet; and Urinary Incontinence   Diagnosis and orders addressed:  1. Urinary frequency - Urinalysis, Complete - CMP14+EGFR - CBC with Differential/Platelet  2. Confusion - Urinalysis, Complete - sulfamethoxazole-trimethoprim (BACTRIM DS) 800-160 MG tablet; Take 1 tablet by mouth 2 (two) times daily.  Dispense: 20 tablet; Refill: 0 - CMP14+EGFR - CBC with Differential/Platelet  3. Type II diabetes mellitus with peripheral autonomic neuropathy (HCC) - CMP14+EGFR - CBC with Differential/Platelet - Bayer DCA Hb A1c Waived  4. Acute cystitis with hematuria Force fluids AZO over the counter X2 days RTO prn Culture pending -  sulfamethoxazole-trimethoprim (BACTRIM DS) 800-160 MG tablet; Take 1 tablet by mouth 2 (two) times daily.  Dispense: 20 tablet; Refill: 0 - Urine Culture - CMP14+EGFR - CBC with Differential/Platelet  5. Cellulitis of lower extremity, unspecified laterality - sulfamethoxazole-trimethoprim (BACTRIM DS) 800-160 MG tablet; Take 1 tablet by mouth 2 (two) times daily.  Dispense: 20 tablet; Refill: 0 - CMP14+EGFR - CBC with Differential/Platelet  6. Venous ulcer ([place]) - CMP14+EGFR - CBC with Differential/Platelet  7. Weakness - CMP14+EGFR - CBC with Differential/Platelet  8. Morbid obesity ([place])  9. Osteoarthritis of multiple joints, unspecified osteoarthritis type - traMADol (ULTRAM) 50 MG tablet; Take 1-2 tablets (50-100 mg total) by mouth every 12 (twelve) hours as needed.  Dispense: 80 tablet; Refill: 1   Labs pending Health Maintenance reviewed Diet and exercise encouraged  Follow up plan: Hopefully pt will get into [facility] asap. Follow up at wound clinic for leg. RTO in 2-4 weeks to recheck weakness and confusion.   Evelina Dun, FNP

## 2018-06-25 NOTE — Patient Instructions (Signed)
Venous Ulcer    A venous ulcer is a shallow sore on your lower leg that is caused by poor circulation in your veins. This condition used to be called stasis ulcer.  Veins have valves that help return blood to the heart. If these valves do not work properly, it can cause blood to flow backward and to back up into the veins near the skin. When that happens, blood can pool in your lower legs. The blood can then leak out of your veins, which can irritate your skin. This may cause a break in your skin that becomes a venous ulcer.  Venous ulcer is the most common type of lower leg ulcer. You may have venous ulcers on one leg or on both legs. The area where this condition most commonly develops is around the ankles. A venous ulcer may last for a long time (chronic ulcer) or it may return repeatedly (recurrent ulcer).  What are the causes?  Any condition that causes poor circulation to your legs can lead to a venous ulcer.  What increases the risk?  This condition is more likely to develop in:   People who are 65 years of age or older.   People who are overweight.   People who are not active.   People who have had a leg ulcer in the past.   People who have clots in their lower leg veins (deep vein thrombosis).   People who have inflammation of their leg veins (phlebitis).   Women who have given birth.   People who smoke.  What are the signs or symptoms?  The most common symptom of this condition is an open sore near your ankle. Other symptoms may include:   Swelling.   Thickening of the skin.   Fluid leaking from the ulcer.   Bleeding.   Itching.   Pain and swelling that gets worse when you stand up and feels better when you raise your leg.   Blotchy skin.   Darkening of the skin.  How is this diagnosed?  Your health care provider may suspect a venous ulcer based on your medical history and your risk factors. Your health care provider will check the skin on your legs. Other tests may be done to learn more  about the ulcer and to determine the best way to treat it. Tests that may be done include:   Measuring the blood pressure in your arms and legs.   Using sound waves (ultrasound) to measure the blood flow in your leg veins.  How is this treated?  You may need to try several different types of treatment to get your venous ulcer to heal. Healing may take a long time. Treatment may include:   Keeping your leg raised (elevated).   Wearing a type of bandage or stocking to compress the veins of your leg (compression therapy). Venous wounds are not likely to heal or to stay healed without compression.   Taking medicines to improve blood flow.   Taking antibiotic medicines to treat infection.   Cleaning your ulcer and removing any dead tissue from the wound (debridement).   Placing various types of medicated bandage (dressings) or medicated wraps on your ulcer. This helps the ulcer to heal and helps to prevent infection.  Surgery is sometimes needed to close the wound using a piece of skin taken from another area of your body (graft). You may need surgery if other treatments are not working or if your ulcer is very deep.  Follow   these instructions at home:  Wound care   Follow instructions from your health care provider about:  ? How to take care of your wound.  ? When and how you should change your bandage (dressing).  ? When you should remove your dressing. If your dressing is dry and sticks to your leg when you try to remove it, moisten or wet the dressing with saline solution or water so that the dressing can be removed without harming your skin or wound tissue.   Check your wound every day for signs of infection. Have a caregiver do this for you if you are not able to do it yourself. Check for:  ? More redness, swelling, or pain.  ? More fluid or blood.  ? Pus, warmth, or a bad smell.  Medicines   Take over-the-counter and prescription medicines only as told by your health care provider.   If you were  prescribed an antibiotic medicine, take it or apply it as told by your health care provider. Do not stop taking or using the antibiotic even if your condition improves.  Activity   Do not stand or sit in one position for a long period of time. Rest with your legs raised during the day. If possible, keep your legs above your heart for 30 minutes, 3-4 times a day, or as told by your health care provider.   Do not sit with your legs crossed.   Walk often to increase the blood flow in your legs.Ask your health care provider what level of activity is safe for you.   If you are taking a long ride in a car or plane, take a break to walk around at least once every two hours, or as often as your health care provider recommends. Ask your health care provider if you should take aspirin before long trips.  General instructions     Wear elastic stockings, compression stockings, or support hose as told by your health care provider. This is very important.   Raise the foot of your bed as told by your health care provider.   Do not smoke.   Keep all follow-up visits as told by your health care provider. This is important.  Contact a health care provider if:   You have a fever.   Your ulcer is getting larger or is not healing.   Your pain gets worse.   You have more redness or swelling around your ulcer.   You have more fluid, blood, or pus coming from your ulcer after it has been cleaned by you or your health care provider.   You have warmth or a bad smell coming from your ulcer.  This information is not intended to replace advice given to you by your health care provider. Make sure you discuss any questions you have with your health care provider.  Document Released: 02/20/2001 Document Revised: 02/20/2017 Document Reviewed: 10/06/2014  Elsevier Interactive Patient Education  2019 [employer]vier [employer]

## 2018-06-26 DIAGNOSIS — R69 Illness, unspecified: Secondary | ICD-10-CM | POA: Diagnosis not present

## 2018-06-26 DIAGNOSIS — R0789 Other chest pain: Secondary | ICD-10-CM | POA: Diagnosis not present

## 2018-06-26 DIAGNOSIS — R079 Chest pain, unspecified: Secondary | ICD-10-CM | POA: Diagnosis not present

## 2018-06-26 DIAGNOSIS — R0689 Other abnormalities of breathing: Secondary | ICD-10-CM | POA: Diagnosis not present

## 2018-06-26 DIAGNOSIS — I10 Essential (primary) hypertension: Secondary | ICD-10-CM | POA: Diagnosis not present

## 2018-06-26 DIAGNOSIS — I872 Venous insufficiency (chronic) (peripheral): Secondary | ICD-10-CM | POA: Diagnosis not present

## 2018-06-26 DIAGNOSIS — I509 Heart failure, unspecified: Secondary | ICD-10-CM | POA: Diagnosis not present

## 2018-06-26 DIAGNOSIS — L03116 Cellulitis of left lower limb: Secondary | ICD-10-CM | POA: Diagnosis not present

## 2018-06-26 DIAGNOSIS — Z7984 Long term (current) use of oral hypoglycemic drugs: Secondary | ICD-10-CM | POA: Diagnosis not present

## 2018-06-26 DIAGNOSIS — L97819 Non-pressure chronic ulcer of other part of right lower leg with unspecified severity: Secondary | ICD-10-CM | POA: Diagnosis not present

## 2018-06-26 DIAGNOSIS — L97829 Non-pressure chronic ulcer of other part of left lower leg with unspecified severity: Secondary | ICD-10-CM | POA: Diagnosis not present

## 2018-06-26 DIAGNOSIS — L03115 Cellulitis of right lower limb: Secondary | ICD-10-CM | POA: Diagnosis not present

## 2018-06-26 DIAGNOSIS — J90 Pleural effusion, not elsewhere classified: Secondary | ICD-10-CM | POA: Diagnosis not present

## 2018-06-26 DIAGNOSIS — R609 Edema, unspecified: Secondary | ICD-10-CM | POA: Diagnosis not present

## 2018-06-26 DIAGNOSIS — G8929 Other chronic pain: Secondary | ICD-10-CM | POA: Diagnosis not present

## 2018-06-26 DIAGNOSIS — Z7982 Long term (current) use of aspirin: Secondary | ICD-10-CM | POA: Diagnosis not present

## 2018-06-26 LAB — CBC WITH DIFFERENTIAL/PLATELET
BASOS: 0 %
Basophils Absolute: 0 10*3/uL (ref 0.0–0.2)
EOS (ABSOLUTE): 0 10*3/uL (ref 0.0–0.4)
Eos: 1 %
Hematocrit: 30.9 % — ABNORMAL LOW (ref 34.0–46.6)
Hemoglobin: 9.6 g/dL — ABNORMAL LOW (ref 11.1–15.9)
Immature Grans (Abs): 0 10*3/uL (ref 0.0–0.1)
Immature Granulocytes: 0 %
LYMPHS ABS: 1 10*3/uL (ref 0.7–3.1)
Lymphs: 19 %
MCH: 26.1 pg — AB (ref 26.6–33.0)
MCHC: 31.1 g/dL — ABNORMAL LOW (ref 31.5–35.7)
MCV: 84 fL (ref 79–97)
MONOS ABS: 0.4 10*3/uL (ref 0.1–0.9)
Monocytes: 7 %
NEUTROS ABS: 3.9 10*3/uL (ref 1.4–7.0)
Neutrophils: 73 %
Platelets: 208 10*3/uL (ref 150–450)
RBC: 3.68 x10E6/uL — ABNORMAL LOW (ref 3.77–5.28)
RDW: 15.2 % (ref 11.7–15.4)
WBC: 5.3 10*3/uL (ref 3.4–10.8)

## 2018-06-26 LAB — CMP14+EGFR
A/G RATIO: 1.4 (ref 1.2–2.2)
ALBUMIN: 3.3 g/dL — AB (ref 3.5–4.7)
ALT: 24 IU/L (ref 0–32)
AST: 49 IU/L — AB (ref 0–40)
Alkaline Phosphatase: 85 IU/L (ref 39–117)
BUN / CREAT RATIO: 14 (ref 12–28)
BUN: 21 mg/dL (ref 8–27)
Bilirubin Total: <0.2 mg/dL (ref 0.0–1.2)
CALCIUM: 8.7 mg/dL (ref 8.7–10.3)
CO2: 21 mmol/L (ref 20–29)
CREATININE: 1.52 mg/dL — AB (ref 0.57–1.00)
Chloride: 96 mmol/L (ref 96–106)
GFR, EST AFRICAN AMERICAN: 36 mL/min/{1.73_m2} — AB (ref >59)
GFR, EST NON AFRICAN AMERICAN: 31 mL/min/{1.73_m2} — AB (ref >59)
GLOBULIN, TOTAL: 2.3 g/dL (ref 1.5–4.5)
Glucose: 162 mg/dL — ABNORMAL HIGH (ref 65–99)
POTASSIUM: 4.5 mmol/L (ref 3.5–5.2)
SODIUM: 135 mmol/L (ref 134–144)
TOTAL PROTEIN: 5.6 g/dL — AB (ref 6.0–8.5)

## 2018-06-26 LAB — URINE CULTURE

## 2018-06-27 DIAGNOSIS — J90 Pleural effusion, not elsewhere classified: Secondary | ICD-10-CM | POA: Diagnosis not present

## 2018-06-27 DIAGNOSIS — L97829 Non-pressure chronic ulcer of other part of left lower leg with unspecified severity: Secondary | ICD-10-CM | POA: Diagnosis not present

## 2018-06-27 DIAGNOSIS — L03116 Cellulitis of left lower limb: Secondary | ICD-10-CM | POA: Diagnosis not present

## 2018-06-27 DIAGNOSIS — I872 Venous insufficiency (chronic) (peripheral): Secondary | ICD-10-CM | POA: Diagnosis not present

## 2018-06-27 DIAGNOSIS — Z7982 Long term (current) use of aspirin: Secondary | ICD-10-CM | POA: Diagnosis not present

## 2018-06-27 DIAGNOSIS — G8929 Other chronic pain: Secondary | ICD-10-CM | POA: Diagnosis not present

## 2018-06-27 DIAGNOSIS — R69 Illness, unspecified: Secondary | ICD-10-CM | POA: Diagnosis not present

## 2018-06-27 DIAGNOSIS — L03115 Cellulitis of right lower limb: Secondary | ICD-10-CM | POA: Diagnosis not present

## 2018-06-27 DIAGNOSIS — L97819 Non-pressure chronic ulcer of other part of right lower leg with unspecified severity: Secondary | ICD-10-CM | POA: Diagnosis not present

## 2018-06-27 DIAGNOSIS — I509 Heart failure, unspecified: Secondary | ICD-10-CM | POA: Diagnosis not present

## 2018-06-27 DIAGNOSIS — Z7984 Long term (current) use of oral hypoglycemic drugs: Secondary | ICD-10-CM | POA: Diagnosis not present

## 2018-07-01 ENCOUNTER — Telehealth: Payer: Self-pay | Admitting: Family

## 2018-07-01 NOTE — Telephone Encounter (Signed)
Aware.  Another provider prescribed doxycycline and [facility] will deliver medication today.

## 2018-07-02 ENCOUNTER — Ambulatory Visit: Payer: Medicare HMO | Admitting: Family Medicine

## 2018-07-03 ENCOUNTER — Other Ambulatory Visit: Payer: Self-pay | Admitting: *Deleted

## 2018-07-03 MED ORDER — BREO ELLIPTA 100-25 MCG/INH IN AEPB: 1.0000 | INHALATION_SPRAY | RESPIRATORY_TRACT | 2 refills | Status: DC

## 2018-07-04 DIAGNOSIS — R52 Pain, unspecified: Secondary | ICD-10-CM | POA: Diagnosis not present

## 2018-07-04 DIAGNOSIS — L97921 Non-pressure chronic ulcer of unspecified part of left lower leg limited to breakdown of skin: Secondary | ICD-10-CM | POA: Diagnosis not present

## 2018-07-04 DIAGNOSIS — Z7982 Long term (current) use of aspirin: Secondary | ICD-10-CM | POA: Diagnosis not present

## 2018-07-04 DIAGNOSIS — I509 Heart failure, unspecified: Secondary | ICD-10-CM | POA: Diagnosis not present

## 2018-07-04 DIAGNOSIS — L03116 Cellulitis of left lower limb: Secondary | ICD-10-CM | POA: Diagnosis not present

## 2018-07-04 DIAGNOSIS — Z7984 Long term (current) use of oral hypoglycemic drugs: Secondary | ICD-10-CM | POA: Diagnosis not present

## 2018-07-04 DIAGNOSIS — R410 Disorientation, unspecified: Secondary | ICD-10-CM | POA: Diagnosis not present

## 2018-07-04 DIAGNOSIS — I872 Venous insufficiency (chronic) (peripheral): Secondary | ICD-10-CM | POA: Diagnosis not present

## 2018-07-04 DIAGNOSIS — Z79899 Other long term (current) drug therapy: Secondary | ICD-10-CM | POA: Diagnosis not present

## 2018-07-04 DIAGNOSIS — E1165 Type 2 diabetes mellitus with hyperglycemia: Secondary | ICD-10-CM | POA: Diagnosis not present

## 2018-07-07 DIAGNOSIS — R5381 Other malaise: Secondary | ICD-10-CM | POA: Diagnosis not present

## 2018-07-08 ENCOUNTER — Telehealth: Payer: Self-pay | Admitting: *Deleted

## 2018-07-08 ENCOUNTER — Telehealth: Payer: Self-pay | Admitting: Family

## 2018-07-08 DIAGNOSIS — R69 Illness, unspecified: Secondary | ICD-10-CM | POA: Diagnosis not present

## 2018-07-08 NOTE — Telephone Encounter (Signed)
Pt needs to go to ED. 

## 2018-07-08 NOTE — Telephone Encounter (Signed)
[name] with [facility] would like to speak with provider to discuss patient's needs of PCS (Personal Care Services).

## 2018-07-08 NOTE — Telephone Encounter (Signed)
TC from [employer] with [employer] She is at patients house, pt has not been taking her medications She visited patient yesterday she had 15 pills of her antibiotic for the cellulitis of her lower left leg. Today her BP is 178/100, she has increased cellulitis, she has 5 blisters on the right leg. The [employer] yesterday has transferred all of her meds to [facility] pharmacy so they are in pill packs Adult Protective services is scheduled to see her this afternoon She is urged to go to the ED, the community paramedic is contacting patients family member.

## 2018-07-09 DIAGNOSIS — S82402B Unspecified fracture of shaft of left fibula, initial encounter for open fracture type I or II: Secondary | ICD-10-CM | POA: Diagnosis not present

## 2018-07-09 DIAGNOSIS — I5020 Unspecified systolic (congestive) heart failure: Secondary | ICD-10-CM | POA: Diagnosis not present

## 2018-07-09 NOTE — Telephone Encounter (Signed)
Patient says she is considering admitting herself to a nursing facility.  Advised to tell nurse when she visits and we can start process.  She seems in good spirits and says her pill boxes are done and she is taking medication as prescribed.

## 2018-07-16 ENCOUNTER — Telehealth: Payer: Self-pay | Admitting: *Deleted

## 2018-07-16 NOTE — Telephone Encounter (Signed)
Received VM from patient that she needs help looking after herself and hoping someone could help I first call Fredric Mare with [facility] [phone] who is trying to help place her in a facility. Fredric Mare had taken her to visit [facility] in [place], but Ms Vandenberg then declined visit. She will have another Child psychotherapist see Ms Snead tomorrow. I received no answer when I called Ms Janusz

## 2018-07-20 ENCOUNTER — Emergency Department (HOSPITAL_COMMUNITY)
Admission: EM | Admit: 2018-07-20 | Discharge: 2018-07-21 | Disposition: A | Discharge status: Home / Self Care | Payer: Medicare HMO | Attending: Emergency Medicine | Admitting: Emergency Medicine

## 2018-07-20 ENCOUNTER — Emergency Department (HOSPITAL_COMMUNITY): Payer: Medicare HMO

## 2018-07-20 ENCOUNTER — Other Ambulatory Visit: Payer: Self-pay

## 2018-07-20 ENCOUNTER — Encounter (HOSPITAL_COMMUNITY): Payer: Self-pay | Admitting: Emergency Medicine

## 2018-07-20 DIAGNOSIS — Z79899 Other long term (current) drug therapy: Secondary | ICD-10-CM | POA: Insufficient documentation

## 2018-07-20 DIAGNOSIS — R69 Illness, unspecified: Secondary | ICD-10-CM | POA: Diagnosis not present

## 2018-07-20 DIAGNOSIS — R531 Weakness: Secondary | ICD-10-CM | POA: Diagnosis not present

## 2018-07-20 DIAGNOSIS — Z7984 Long term (current) use of oral hypoglycemic drugs: Secondary | ICD-10-CM | POA: Insufficient documentation

## 2018-07-20 DIAGNOSIS — Z950 Presence of cardiac pacemaker: Secondary | ICD-10-CM | POA: Insufficient documentation

## 2018-07-20 DIAGNOSIS — I110 Hypertensive heart disease with heart failure: Secondary | ICD-10-CM | POA: Diagnosis not present

## 2018-07-20 DIAGNOSIS — J449 Chronic obstructive pulmonary disease, unspecified: Secondary | ICD-10-CM | POA: Diagnosis not present

## 2018-07-20 DIAGNOSIS — R410 Disorientation, unspecified: Secondary | ICD-10-CM | POA: Diagnosis not present

## 2018-07-20 DIAGNOSIS — Z96653 Presence of artificial knee joint, bilateral: Secondary | ICD-10-CM | POA: Diagnosis not present

## 2018-07-20 DIAGNOSIS — E039 Hypothyroidism, unspecified: Secondary | ICD-10-CM | POA: Insufficient documentation

## 2018-07-20 DIAGNOSIS — F0390 Unspecified dementia without behavioral disturbance: Secondary | ICD-10-CM | POA: Diagnosis not present

## 2018-07-20 DIAGNOSIS — I10 Essential (primary) hypertension: Secondary | ICD-10-CM | POA: Diagnosis not present

## 2018-07-20 DIAGNOSIS — I5042 Chronic combined systolic (congestive) and diastolic (congestive) heart failure: Secondary | ICD-10-CM | POA: Diagnosis not present

## 2018-07-20 DIAGNOSIS — Z7982 Long term (current) use of aspirin: Secondary | ICD-10-CM | POA: Insufficient documentation

## 2018-07-20 DIAGNOSIS — E119 Type 2 diabetes mellitus without complications: Secondary | ICD-10-CM | POA: Insufficient documentation

## 2018-07-20 HISTORY — DX: Unspecified dementia, unspecified severity, without behavioral disturbance, psychotic disturbance, mood disturbance, and anxiety: F03.90

## 2018-07-20 LAB — BASIC METABOLIC PANEL
Anion gap: 10 (ref 5–15)
BUN: 57 mg/dL — ABNORMAL HIGH (ref 8–23)
CO2: 28 mmol/L (ref 22–32)
Calcium: 8.9 mg/dL (ref 8.9–10.3)
Chloride: 93 mmol/L — ABNORMAL LOW (ref 98–111)
Creatinine, Ser: 1.31 mg/dL — ABNORMAL HIGH (ref 0.44–1.00)
GFR calc Af Amer: 44 mL/min — ABNORMAL LOW (ref >60)
GFR calc non Af Amer: 38 mL/min — ABNORMAL LOW (ref >60)
Glucose, Bld: 139 mg/dL — ABNORMAL HIGH (ref 70–99)
Potassium: 4.9 mmol/L (ref 3.5–5.1)
Sodium: 131 mmol/L — ABNORMAL LOW (ref 135–145)

## 2018-07-20 LAB — CBC
HEMATOCRIT: 34.2 % — AB (ref 36.0–46.0)
Hemoglobin: 10.1 g/dL — ABNORMAL LOW (ref 12.0–15.0)
MCH: 26.9 pg (ref 26.0–34.0)
MCHC: 29.5 g/dL — ABNORMAL LOW (ref 30.0–36.0)
MCV: 91.2 fL (ref 80.0–100.0)
Platelets: 231 10*3/uL (ref 150–400)
RBC: 3.75 MIL/uL — ABNORMAL LOW (ref 3.87–5.11)
RDW: 18.9 % — ABNORMAL HIGH (ref 11.5–15.5)
WBC: 6.3 10*3/uL (ref 4.0–10.5)
nRBC: 0 % (ref 0.0–0.2)

## 2018-07-20 MED ORDER — PRAVASTATIN SODIUM 40 MG PO TABS
80.0000 mg | ORAL_TABLET | Freq: Every day | ORAL | Status: DC
  Administered 2018-07-20: 80 mg via ORAL
  Filled 2018-07-20 (×3): qty 1

## 2018-07-20 MED ORDER — TORSEMIDE 20 MG PO TABS
40.0000 mg | ORAL_TABLET | Freq: Two times a day (BID) | ORAL | Status: DC
  Administered 2018-07-21: 40 mg via ORAL
  Filled 2018-07-20 (×6): qty 2

## 2018-07-20 MED ORDER — ALBUTEROL SULFATE HFA 108 (90 BASE) MCG/ACT IN AERS: 2.0000 | INHALATION_SPRAY | Freq: Four times a day (QID) | RESPIRATORY_TRACT | Status: DC | PRN

## 2018-07-20 MED ORDER — SERTRALINE HCL 50 MG PO TABS
50.0000 mg | ORAL_TABLET | Freq: Every day | ORAL | Status: DC
  Administered 2018-07-21: 50 mg via ORAL
  Filled 2018-07-20: qty 1

## 2018-07-20 MED ORDER — TRAMADOL HCL 50 MG PO TABS
50.0000 mg | ORAL_TABLET | Freq: Two times a day (BID) | ORAL | Status: DC | PRN
  Administered 2018-07-21: 50 mg via ORAL
  Filled 2018-07-20: qty 1

## 2018-07-20 MED ORDER — LEVOTHYROXINE SODIUM 50 MCG PO TABS
200.0000 ug | ORAL_TABLET | Freq: Every day | ORAL | Status: DC
  Administered 2018-07-21: 200 ug via ORAL
  Filled 2018-07-20: qty 4

## 2018-07-20 MED ORDER — LISINOPRIL 10 MG PO TABS
20.0000 mg | ORAL_TABLET | Freq: Every day | ORAL | Status: DC
  Administered 2018-07-21: 20 mg via ORAL
  Filled 2018-07-20: qty 2

## 2018-07-20 MED ORDER — CARVEDILOL 12.5 MG PO TABS
12.5000 mg | ORAL_TABLET | Freq: Two times a day (BID) | ORAL | Status: DC
  Administered 2018-07-21: 12.5 mg via ORAL
  Filled 2018-07-20: qty 1

## 2018-07-20 MED ORDER — METFORMIN HCL 500 MG PO TABS
1000.0000 mg | ORAL_TABLET | Freq: Two times a day (BID) | ORAL | Status: DC
  Administered 2018-07-21: 1000 mg via ORAL
  Filled 2018-07-20: qty 2

## 2018-07-20 MED ORDER — ALPRAZOLAM 0.5 MG PO TABS
0.5000 mg | ORAL_TABLET | Freq: Every day | ORAL | Status: DC
  Administered 2018-07-20: 0.5 mg via ORAL
  Filled 2018-07-20: qty 1

## 2018-07-20 MED ORDER — ACETAMINOPHEN 325 MG PO TABS: 650.0000 mg | ORAL_TABLET | Freq: Four times a day (QID) | ORAL | Status: DC | PRN

## 2018-07-20 MED ORDER — GABAPENTIN 300 MG PO CAPS
300.0000 mg | ORAL_CAPSULE | Freq: Three times a day (TID) | ORAL | Status: DC
  Administered 2018-07-20 – 2018-07-21 (×2): 300 mg via ORAL
  Filled 2018-07-20 (×2): qty 1

## 2018-07-20 MED ORDER — PANTOPRAZOLE SODIUM 40 MG PO TBEC
40.0000 mg | DELAYED_RELEASE_TABLET | Freq: Every day | ORAL | Status: DC
  Administered 2018-07-21: 40 mg via ORAL
  Filled 2018-07-20: qty 1

## 2018-07-20 NOTE — ED Notes (Signed)
On call APS worker paged.

## 2018-07-20 NOTE — ED Notes (Addendum)
Have spoken with Ms. Mayford Knife who is Child psychotherapist on call who states there is nothing that can be done with the patient tonight. Awaiting a return call from her regarding possible emergency custody from APS.  Pt has nobody to come get her due to not having her neighbor's phone number and feel it is unsafe for her to return without supervision as pt cannot state the month or day, does not know how she got here, thinks she may still be cooking at home but is unsure, and does not remember any conversation from earlier this week with social work or her pcp office.

## 2018-07-20 NOTE — ED Triage Notes (Addendum)
Patient brought into ER via EMS from home. Patient alert with confusion. Patient lives by herself, has hx of dementia. Per patient has nursing aid check on her everyday. Patient poor historian. Patient called out for weakness and "generally not feeling well." Patient denies any pain now but states she has had some chest pain. Per patient aching in chest "that felt like indigestion." Per paramedic patient takes tramadol but bottle was empty and patient's pupils were pinpoint. Patient reports increased confusion.

## 2018-07-20 NOTE — ED Notes (Signed)
Spoke with pt's son who states pt's behavior is escalating and they have attempted to get her into a nursing facility without success.  States she calls the sheriff dept for random things, thinks her belongings are being stolen, yet refuses any assistance.  States this has been ongoing for about 9 months since her husband died.

## 2018-07-20 NOTE — ED Notes (Signed)
Home meds not given on previous shift, per report unsure which home meds pt has already taken today, discussed with EDP, ok to hold these meds and assume daily home meds starting with bedtime meds

## 2018-07-20 NOTE — ED Notes (Signed)
[name] [name] states to call DSS at 830 in the morning to be assessed in the ED and she will also call.  2 supervisors from DSS state they cannot do anything with this patient tonight and she will either have to return home or stay in the ED until morning.

## 2018-07-20 NOTE — ED Notes (Signed)
Spoke with pt's daughter who states the patient is unable to care for herself adequately at home and they have been trying to get her to consent to a facility and she will not.  Reports pt often gets confused and is very forgetful.  She is afraid for the pt's safety, but the patient is not bad enough to be declared incompetent by the court.  Pt wants someone to come stay with her in her home 24/7 and the family is unable to do this themselves and cannot afford to pay a private individual.  Adult protective services has been consulted and nothing has been done as of yet.

## 2018-07-20 NOTE — ED Provider Notes (Signed)
[facility] EMERGENCY DEPARTMENT Provider Note   CSN: [id number] Arrival date & time: 07/20/18  1525     History   Chief Complaint Chief Complaint  Patient presents with  . Weakness    HPI [name] [name] is a 85 y.o. female.  Patient brought in by EMS from [place].  Patient has a history of dementia.  When she is gotten here she does not know why she is here she wants to go home.  EMS did bring her in from home.  Apparently she called them herself she lives by herself.  Patient denied any pain.  To EMS she did mention some chest pain.  But here she denied any pain.  Patient apparently takes tramadol paramedics of the bottle is empty.  Supposedly was increased confusion.  Family members were contacted.  They been trying to get placement for her and were working with Adult Management consultant.  Patient has refused nursing home placement in the past.  Apparently patient has not had anything acute going on but she will frequently call EMS or the sheriff.     Past Medical History:  Diagnosis Date  . ANXIETY   . Arteriosclerotic cardiovascular disease (ASCVD)    Cath in 9/99-70% mid LAD with diffuse distal disease, 70% T1, 60% mid circumflex, 50% mid RCA, normal ejection fraction; negative stress nuclear in 07/2008  . Carpal tunnel syndrome   . Cerebrovascular disease    carotid bruits; no focal disease in 1999, 2002 and 2006  . Closed left fibular fracture 11/30/11  . COPD   . Dementia (HCC)   . Diabetes mellitus    Insulin treatment  . Diastolic CHF, chronic (HCC)    Normal EF  . Gastroesophageal reflux disease    With hiatal hernia; irritable bowel syndrome; H. pylori-treated; Colonoscopy 2008: non-specific colitis, IH, Diverticulosis, Rectal ulcer secondary to ASA  . HOH (hard of hearing)   . Hyperlipidemia   . Hypertension   . Hypothyroidism   . OSTEOARTHRITIS, KNEES, BILATERAL    Bilateral TKA  . Pacemaker   . Peripheral neuropathy   . Sinoatrial node  dysfunction (HCC) 2003   Medtronic dual-chamber device  . Sleep apnea    pt could not tolerate  . VITAMIN B12 DEFICIENCY     Patient Active Problem List   Diagnosis Date Noted  . Palliative care by specialist   . DNR (do not resuscitate) discussion   . CHF exacerbation (HCC) 02/27/2018  . Fall at home, initial encounter 02/23/2018  . Fall 02/23/2018  . Aortic atherosclerosis (HCC) 10/15/2017  . Chronic combined systolic (congestive) and diastolic (congestive) heart failure (HCC) 07/02/2017  . Pleural effusion   . Palliative care encounter   . Goals of care, counseling/discussion   . Pressure injury of skin 06/29/2017  . Elevated troponin 06/28/2017  . Normocytic anemia 06/28/2017  . Sleep apnea 06/28/2017  . Tinea cruris 06/28/2017  . Hyperlipidemia associated with type 2 diabetes mellitus (HCC) 05/14/2017  . Chronic back pain 01/28/2017  . PVD (peripheral vascular disease) (HCC) 01/27/2016  . Skin moniliasis 05/31/2014  . OAB (overactive bladder) 12/23/2012  . Insomnia 04/06/2011  . Sinoatrial node dysfunction (HCC)   . Gastroesophageal reflux disease   . Type II diabetes mellitus with peripheral autonomic neuropathy (HCC)   . GAD (generalized anxiety disorder) 12/30/2009  . Diabetic neuropathy (HCC) 12/26/2009  . Vitamin B 12 deficiency 10/11/2009  . Morbid obesity (HCC) 09/16/2009  . Arteriosclerotic cardiovascular disease (ASCVD) 09/15/2009  . CEREBROVASCULAR  DISEASE 09/15/2009  . Hypertension associated with diabetes (HCC) 08/29/2009  . Osteoarthrosis involving lower leg 08/29/2009  . COLONIC POLYPS, ADENOMATOUS, HX OF 02/22/2009  . Hypothyroid 02/17/2009  . Chronic obstructive pulmonary disease (HCC) 02/17/2009  . Chronic diastolic heart failure (HCC) 12/15/2008  . PPM-Medtronic 12/15/2008    Past Surgical History:  Procedure Laterality Date  . ABDOMINAL HYSTERECTOMY    . BLADDER REPAIR    . CARDIAC PACEMAKER PLACEMENT  2003   Medtronic, dual-chamber  .  CARPAL TUNNEL RELEASE    . CATARACT EXTRACTION W/PHACO Left 08/09/2017   Procedure: CATARACT EXTRACTION PHACO AND INTRAOCULAR LENS PLACEMENT (IOC);  Surgeon: Fabio PierceWrzosek, James, MD;  Location: [facility]S;  Service: Ophthalmology;  Laterality: Left;  CDE: 6.26  . CHOLECYSTECTOMY    . COLONOSCOPY  2008  . HEMORRHOID SURGERY    . NASAL FRACTURE SURGERY    . THYROIDECTOMY  1980   Goiter  . TOTAL KNEE ARTHROPLASTY     Bilateral     OB History   No obstetric history on file.      Home Medications    Prior to Admission medications   Medication Sig Start Date End Date Taking? Authorizing Provider  acetaminophen (TYLENOL) 325 MG tablet Take 2 tablets (650 mg total) by mouth every 6 (six) hours as needed for mild pain (or Fever >/= 101). 02/25/18   Shon HaleEmokpae, Courage, MD  ALPRAZolam Prudy Feeler(XANAX) 0.5 MG tablet TAKE 1 TABLET AT BEDTIME AS NEEDED FOR ANXIETY 06/17/18   Jannifer RodneyHawks, Christy A, FNP  aspirin EC 81 MG tablet Take 81 mg by mouth daily. Reported on 12/14/2015    [provider]  BREO ELLIPTA 100-25 MCG/INH AEPB Inhale 1 puff into the lungs every morning. For wheezing or shortness of breath. Rinse mouth after use 07/03/18   Jannifer RodneyHawks, Christy A, FNP  carvedilol (COREG) 12.5 MG tablet Take 1 tablet (12.5 mg total) by mouth 2 (two) times daily. 05/15/18 08/13/18  Marinus Mawaylor, Gregg W, MD  gabapentin (NEURONTIN) 300 MG capsule TAKE (1) CAPSULE THREE TIMES DAILY. 05/21/18   Jannifer RodneyHawks, Christy A, FNP  glucose blood (GLUCOSE METER TEST) test strip Use BID 07/11/17   Hawks, Neysa Bonitohristy A, FNP  levothyroxine (SYNTHROID, LEVOTHROID) 200 MCG tablet TAKE (1) TABLET DAILY BE- FORE BREAKFAST. 05/13/18   Jannifer RodneyHawks, Christy A, FNP  lisinopril (PRINIVIL,ZESTRIL) 20 MG tablet Take 1 tablet (20 mg total) by mouth daily. 05/12/18   Jannifer RodneyHawks, Christy A, FNP  metFORMIN (GLUCOPHAGE) 1000 MG tablet TAKE (1) TABLET TWICE A DAY WITH MEALS (BREAKFAST AND SUPPER) FOR DIAB ETES 01/24/18   Hawks, Neysa Bonitohristy A, FNP  nitroGLYCERIN (NITROLINGUAL) 0.4 MG/SPRAY spray Place  1 spray under the tongue every 5 (five) minutes as needed. 07/09/17   Strader, Lennart PallBrittany M, PA-C  nystatin-triamcinolone (MYCOLOG II) cream APPLY TO AFFECTED AREA 3 TIMES A DAY Patient not taking: Reported on 06/25/2018 04/18/18   Junie SpencerHawks, Christy A, FNP  omeprazole (PRILOSEC) 20 MG capsule Take 1 capsule (20 mg total) by mouth daily. 05/12/18   Junie SpencerHawks, Christy A, FNP  pravastatin (PRAVACHOL) 80 MG tablet TAKE 1 TABLET ONCE DAILY FOR CHOLESTEROL 02/21/18   Junie SpencerHawks, Christy A, FNP  PROAIR HFA 108 [phone](90 Base) MCG/ACT inhaler 2 PUFFS EVERY 6 HOURS AS NEEDED FOR WHEEZING 08/07/17   Jannifer RodneyHawks, Christy A, FNP  sertraline (ZOLOFT) 50 MG tablet TAKE 1 TABLET DAILY 02/21/18   Jannifer RodneyHawks, Christy A, FNP  sulfamethoxazole-trimethoprim (BACTRIM DS) 800-160 MG tablet Take 1 tablet by mouth 2 (two) times daily. 06/25/18   Junie SpencerHawks, Christy A,  FNP  tamsulosin (FLOMAX) 0.4 MG CAPS capsule Take 1 capsule (0.4 mg total) by mouth daily after supper. 02/25/18   Shon HaleEmokpae, Courage, MD  torsemide (DEMADEX) 20 MG tablet 40 mg in AM, 40 mg afternoon, 20 mg early evening 04/18/18   Jannifer RodneyHawks, Christy A, FNP  traMADol (ULTRAM) 50 MG tablet Take 1-2 tablets (50-100 mg total) by mouth every 12 (twelve) hours as needed. 06/25/18   Junie SpencerHawks, Christy A, FNP    Family History Family History  Problem Relation Age of Onset  . COPD Father   . Heart attack Father 464  . Heart failure Mother   . Cancer Sister        colon  . Cancer Brother        lung  . Cancer Sister        bladder  . Cancer Brother        lung  . Early death Son   . Diabetes Son   . Heart disease Son   . Cancer Son        prostate  . Cancer Sister        kidney cancer  . Diabetes Sister   . Cancer Daughter     Social History Social History   Tobacco Use  . Smoking status: Never Smoker  . Smokeless tobacco: Never Used  Substance Use Topics  . Alcohol use: No    Alcohol/week: 0.0 standard drinks  . Drug use: No     Allergies   Penicillins   Review of Systems Review of  Systems  Unable to perform ROS: Dementia     Physical Exam Updated Vital Signs BP (!) 145/79 (BP Location: Left Arm)   Pulse 68   Temp 98 F (36.7 C) (Oral)   Resp 16   Ht 1.778 m (5\' 10" )   Wt 92.1 kg   SpO2 100%   BMI 29.13 kg/m   Physical Exam Vitals signs and nursing note reviewed.  Constitutional:      General: She is not in acute distress.    Appearance: She is well-developed.  HENT:     Head: Normocephalic and atraumatic.     Nose: No congestion.  Eyes:     Extraocular Movements: Extraocular movements intact.     Conjunctiva/sclera: Conjunctivae normal.     Pupils: Pupils are equal, round, and reactive to light.  Neck:     Musculoskeletal: Normal range of motion and neck supple.  Cardiovascular:     Rate and Rhythm: Normal rate and regular rhythm.     Heart sounds: No murmur.  Pulmonary:     Effort: Pulmonary effort is normal. No respiratory distress.     Breath sounds: Normal breath sounds.  Abdominal:     Palpations: Abdomen is soft.     Tenderness: There is no abdominal tenderness.  Musculoskeletal: Normal range of motion.  Skin:    General: Skin is warm and dry.     Capillary Refill: Capillary refill takes less than 2 seconds.  Neurological:     General: No focal deficit present.     Mental Status: She is alert. Mental status is at baseline.      ED Treatments / Results  Labs (all labs ordered are listed, but only abnormal results are displayed) Labs Reviewed  CBC - Abnormal; Notable for the following components:      Result Value   RBC 3.75 (*)    Hemoglobin 10.1 (*)    HCT 34.2 (*)    MCHC  29.5 (*)    RDW 18.9 (*)    All other components within normal limits  BASIC METABOLIC PANEL - Abnormal; Notable for the following components:   Sodium 131 (*)    Chloride 93 (*)    Glucose, Bld 139 (*)    BUN 57 (*)    Creatinine, Ser 1.31 (*)    GFR calc non Af Amer 38 (*)    GFR calc Af Amer 44 (*)    All other components within normal limits      EKG EKG Interpretation  Date/Time:  Sunday July 20 2018 15:35:07 EST Ventricular Rate:  69 PR Interval:    QRS Duration: 201 QT Interval:  505 QTC Calculation: 542 R Axis:   -73 Text Interpretation:  Atrial-ventricular dual-paced rhythm No further analysis attempted due to paced rhythm Baseline wander in lead(s) V3 Confirmed by Vanetta Mulders [phone]) on 07/20/2018 3:39:31 PM   Radiology Ct Head Wo Contrast  Result Date: 07/20/2018 CLINICAL DATA:  85 year old female with history of weakness. EXAM: CT HEAD WITHOUT CONTRAST TECHNIQUE: Contiguous axial images were obtained from the base of the skull through the vertex without intravenous contrast. COMPARISON:  Head CT 02/23/2018. FINDINGS: Brain: Patchy and confluent areas of decreased attenuation are noted throughout the deep and periventricular white matter of the cerebral hemispheres bilaterally, compatible with chronic microvascular ischemic disease. No evidence of acute infarction, hemorrhage, hydrocephalus, extra-axial collection or mass lesion/mass effect. Vascular: No hyperdense vessel or unexpected calcification. Skull: Normal. Negative for fracture or focal lesion. Sinuses/Orbits: No acute finding. Other: None. IMPRESSION: 1. No acute intracranial abnormalities. 2. Chronic microvascular ischemic changes in the cerebral white matter, as above. Electronically Signed   By: Trudie Reed M.D.   On: 07/20/2018 17:18    Procedures Procedures (including critical care time)  Medications Ordered in ED Medications  acetaminophen (TYLENOL) tablet 650 mg (has no administration in time range)  ALPRAZolam (XANAX) tablet 0.5 mg (has no administration in time range)  carvedilol (COREG) tablet 12.5 mg (has no administration in time range)  gabapentin (NEURONTIN) capsule 300 mg (has no administration in time range)  levothyroxine (SYNTHROID, LEVOTHROID) tablet 200 mcg (has no administration in time range)  lisinopril (PRINIVIL,ZESTRIL)  tablet 20 mg (has no administration in time range)  metFORMIN (GLUCOPHAGE) tablet 1,000 mg (has no administration in time range)  pantoprazole (PROTONIX) EC tablet 40 mg (has no administration in time range)  pravastatin (PRAVACHOL) tablet 80 mg (has no administration in time range)  albuterol (PROVENTIL HFA;VENTOLIN HFA) 108 (90 Base) MCG/ACT inhaler 2 puff (has no administration in time range)  sertraline (ZOLOFT) tablet 50 mg (has no administration in time range)  torsemide (DEMADEX) tablet 40 mg (has no administration in time range)  traMADol (ULTRAM) tablet 50 mg (has no administration in time range)     Initial Impression / Assessment and Plan / ED Course  I have reviewed the triage vital signs and the nursing notes.  Pertinent labs & imaging results that were available during my care of the patient were reviewed by me and considered in my medical decision making (see chart for details).     Patient cooperative here.  Basic work-up head CT labs without significant abnormalities.  Has some mild hyponatremia.  Patient apparently has been followed by adult protective services after discussing with family.  They have been trying to get placement for her.  Sounds as if patient is willing to be placed.  Adult Protective Services wants her to stay here and she  is already been accepted in a nursing facility which can be formally arranged for tomorrow.  Patient nontoxic no acute distress here.  Patient's medications and a diabetic diet has been ordered.  Patient will be staying here overnight.  Final Clinical Impressions(s) / ED Diagnoses   Final diagnoses:  Dementia without behavioral disturbance, unspecified dementia type [facility])    ED Discharge Orders    None       Vanetta Mulders, MD 07/20/18 1816

## 2018-07-21 DIAGNOSIS — F0390 Unspecified dementia without behavioral disturbance: Secondary | ICD-10-CM | POA: Diagnosis not present

## 2018-07-21 LAB — URINALYSIS, ROUTINE W REFLEX MICROSCOPIC
Bilirubin Urine: NEGATIVE
GLUCOSE, UA: NEGATIVE mg/dL
Hgb urine dipstick: NEGATIVE
Ketones, ur: NEGATIVE mg/dL
Leukocytes, UA: NEGATIVE
Nitrite: NEGATIVE
PH: 6 (ref 5.0–8.0)
Protein, ur: NEGATIVE mg/dL
Specific Gravity, Urine: <1.005 — ABNORMAL LOW (ref 1.005–1.030)

## 2018-07-21 MED ORDER — LISINOPRIL 20 MG PO TABS: 20.0000 mg | ORAL_TABLET | Freq: Every day | ORAL | 0 refills | Status: AC

## 2018-07-21 MED ORDER — FLUTICASONE FUROATE-VILANTEROL 100-25 MCG/INH IN AEPB: 1.0000 | INHALATION_SPRAY | Freq: Every morning | RESPIRATORY_TRACT | 0 refills | Status: AC

## 2018-07-21 MED ORDER — CARVEDILOL 12.5 MG PO TABS: 12.5000 mg | ORAL_TABLET | Freq: Two times a day (BID) | ORAL | 0 refills | Status: AC

## 2018-07-21 MED ORDER — ACETAMINOPHEN 325 MG PO TABS: 650.0000 mg | ORAL_TABLET | Freq: Four times a day (QID) | ORAL | 0 refills | Status: AC | PRN

## 2018-07-21 MED ORDER — PRAVASTATIN SODIUM 80 MG PO TABS: 80.0000 mg | ORAL_TABLET | Freq: Every day | ORAL | 0 refills | Status: AC

## 2018-07-21 MED ORDER — TORSEMIDE 20 MG PO TABS: 40.0000 mg | ORAL_TABLET | Freq: Two times a day (BID) | ORAL | 0 refills | Status: DC

## 2018-07-21 MED ORDER — OMEPRAZOLE 20 MG PO CPDR: 20.0000 mg | DELAYED_RELEASE_CAPSULE | Freq: Every day | ORAL | 0 refills | Status: AC

## 2018-07-21 MED ORDER — TUBERCULIN PPD 5 UNIT/0.1ML ID SOLN
5.0000 [IU] | Freq: Once | INTRADERMAL | Status: DC
  Administered 2018-07-21: 5 [IU] via INTRADERMAL
  Filled 2018-07-21: qty 0.1

## 2018-07-21 MED ORDER — ALPRAZOLAM 0.5 MG PO TABS: 0.5000 mg | ORAL_TABLET | Freq: Every evening | ORAL | 0 refills | Status: AC | PRN

## 2018-07-21 MED ORDER — METFORMIN HCL 1000 MG PO TABS: 1000.0000 mg | ORAL_TABLET | Freq: Two times a day (BID) | ORAL | 0 refills | Status: AC

## 2018-07-21 MED ORDER — ASPIRIN 81 MG PO CHEW: 81.0000 mg | CHEWABLE_TABLET | Freq: Every day | ORAL | 0 refills | Status: DC

## 2018-07-21 MED ORDER — LEVOTHYROXINE SODIUM 200 MCG PO TABS: 200.0000 ug | ORAL_TABLET | Freq: Every day | ORAL | 0 refills | Status: DC

## 2018-07-21 MED ORDER — GABAPENTIN 300 MG PO CAPS: 300.0000 mg | ORAL_CAPSULE | Freq: Three times a day (TID) | ORAL | 0 refills | Status: AC

## 2018-07-21 MED ORDER — ALBUTEROL SULFATE HFA 108 (90 BASE) MCG/ACT IN AERS: 2.0000 | INHALATION_SPRAY | Freq: Four times a day (QID) | RESPIRATORY_TRACT | 0 refills | Status: AC | PRN

## 2018-07-21 MED ORDER — SERTRALINE HCL 50 MG PO TABS: 50.0000 mg | ORAL_TABLET | Freq: Every day | ORAL | 0 refills | Status: AC

## 2018-07-21 MED ORDER — NITROGLYCERIN 0.4 MG/SPRAY TL SOLN: 1.0000 | 0 refills | Status: DC | PRN

## 2018-07-21 MED ORDER — TRAMADOL HCL 50 MG PO TABS: 50.0000 mg | ORAL_TABLET | Freq: Two times a day (BID) | ORAL | 0 refills | Status: AC | PRN

## 2018-07-21 NOTE — ED Notes (Signed)
Fredric Mare, With APS speaking with pt's son at this time.

## 2018-07-21 NOTE — ED Notes (Signed)
Gave patient meal tray.

## 2018-07-21 NOTE — ED Notes (Signed)
[name] [name] here to get [name] [name].  [name] requesting home health order for nursing and [name] [name] concerning [name] [name]'s wounds to legs.  Dr. Clarene Duke aware and hand written order given.
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
## 2018-07-21 NOTE — ED Notes (Signed)
Patient lying in bed sleeping at this time.

## 2018-07-21 NOTE — ED Provider Notes (Signed)
Pt apparently has been accepted to NH. FL2 form completed. Will d/c to NH stable.    Samuel Jester, DO 07/21/18 1416

## 2018-07-21 NOTE — ED Notes (Signed)
[name] [name], APS guardian, signed d/c instructions.

## 2018-07-21 NOTE — [place] FL2 (Signed)
MEDICAID FL2 LEVEL OF CARE SCREENING TOOL     IDENTIFICATION  Patient Name: [name] [name] Birthdate: [birth year] Sex: female Admission Date (Current Location): 07/20/2018  [place] and [place] Number:  Aaron Edelman [id number] N Facility and Address:         Provider Number: [id number]  Attending Physician Name and Address:  Default, Provider, MD  Relative Name and Phone Number:       Current Level of Care: Hospital Recommended Level of Care: Assisted Living Facility Prior Approval Number:    Date Approved/Denied:   PASRR Number:    Discharge Plan: Domiciliary (Rest home)    Current Diagnoses: Patient Active Problem List   Diagnosis Date Noted  . Palliative care by specialist   . DNR (do not resuscitate) discussion   . CHF exacerbation (HCC) 02/27/2018  . Fall at home, initial encounter 02/23/2018  . Fall 02/23/2018  . Aortic atherosclerosis (HCC) 10/15/2017  . Chronic combined systolic (congestive) and diastolic (congestive) heart failure (HCC) 07/02/2017  . Pleural effusion   . Palliative care encounter   . Goals of care, counseling/discussion   . Pressure injury of skin 06/29/2017  . Elevated troponin 06/28/2017  . Normocytic anemia 06/28/2017  . Sleep apnea 06/28/2017  . Tinea cruris 06/28/2017  . Hyperlipidemia associated with type 2 diabetes mellitus (HCC) 05/14/2017  . Chronic back pain 01/28/2017  . PVD (peripheral vascular disease) (HCC) 01/27/2016  . Skin moniliasis 05/31/2014  . OAB (overactive bladder) 12/23/2012  . Insomnia 04/06/2011  . Sinoatrial node dysfunction (HCC)   . Gastroesophageal reflux disease   . Type II diabetes mellitus with peripheral autonomic neuropathy (HCC)   . GAD (generalized anxiety disorder) 12/30/2009  . Diabetic neuropathy (HCC) 12/26/2009  . Vitamin B 12 deficiency 10/11/2009  . Morbid obesity (HCC) 09/16/2009  . Arteriosclerotic cardiovascular disease (ASCVD) 09/15/2009  . CEREBROVASCULAR DISEASE 09/15/2009   . Hypertension associated with diabetes (HCC) 08/29/2009  . Osteoarthrosis involving lower leg 08/29/2009  . COLONIC POLYPS, ADENOMATOUS, HX OF 02/22/2009  . Hypothyroid 02/17/2009  . Chronic obstructive pulmonary disease (HCC) 02/17/2009  . Chronic diastolic heart failure (HCC) 12/15/2008  . PPM-Medtronic 12/15/2008    Orientation RESPIRATION BLADDER Height & Weight     Self  Normal Continent Weight: 92.1 kg Height:  5\' 10"  (177.8 cm)  BEHAVIORAL SYMPTOMS/MOOD NEUROLOGICAL BOWEL NUTRITION STATUS    (none) Continent Diet(Carb modified)  AMBULATORY STATUS COMMUNICATION OF NEEDS Skin   Limited Assist Verbally Normal                       Personal Care Assistance Level of Assistance  Bathing, Feeding, Dressing Bathing Assistance: Limited assistance Feeding assistance: Independent Dressing Assistance: Limited assistance     Functional Limitations Info  Sight, Hearing, Speech Sight Info: Adequate Hearing Info: Adequate Speech Info: Adequate    SPECIAL CARE FACTORS FREQUENCY                       Contractures Contractures Info: Not present    Additional Factors Info  Code Status Code Status Info: DNR Allergies Info: Penicillins           Current Medications (07/21/2018):    Discharge Medications (07/21/2018):  Current Outpatient Medications  Medication Sig Dispense Refill  . acetaminophen (TYLENOL) 325 MG tablet Take 2 tablets (650 mg total) by mouth every 6 (six) hours as needed for mild pain (or Fever >/= 101). 12 tablet 0  . ALPRAZolam (XANAX) 0.5 MG tablet TAKE  1 TABLET AT BEDTIME AS NEEDED FOR ANXIETY 30 tablet 3  . aspirin EC 81 MG tablet Take 81 mg by mouth daily. Reported on 12/14/2015    . BREO ELLIPTA 100-25 MCG/INH AEPB Inhale 1 puff into the lungs every morning. For wheezing or shortness of breath. Rinse mouth after use 1 each 2  . carvedilol (COREG) 12.5 MG tablet Take 1 tablet (12.5 mg total) by mouth 2 (two) times daily. 180 tablet 3  .  gabapentin (NEURONTIN) 300 MG capsule TAKE (1) CAPSULE THREE TIMES DAILY. 90 capsule 2  . glucose blood (GLUCOSE METER TEST) test strip Use BID 100 each 12  . levothyroxine (SYNTHROID, LEVOTHROID) 200 MCG tablet TAKE (1) TABLET DAILY BE- FORE BREAKFAST. 90 tablet 3  . lisinopril (PRINIVIL,ZESTRIL) 20 MG tablet Take 1 tablet (20 mg total) by mouth daily. 90 tablet 3  . metFORMIN (GLUCOPHAGE) 1000 MG tablet TAKE (1) TABLET TWICE A DAY WITH MEALS (BREAKFAST AND SUPPER) FOR DIAB ETES 180 tablet 0  . nitroGLYCERIN (NITROLINGUAL) 0.4 MG/SPRAY spray Place 1 spray under the tongue every 5 (five) minutes as needed. 12 g 2  . nystatin-triamcinolone (MYCOLOG II) cream APPLY TO AFFECTED AREA 3 TIMES A DAY (Patient not taking: Reported on 06/25/2018) 60 g 4  . omeprazole (PRILOSEC) 20 MG capsule Take 1 capsule (20 mg total) by mouth daily. 90 capsule 3  . pravastatin (PRAVACHOL) 80 MG tablet TAKE 1 TABLET ONCE DAILY FOR CHOLESTEROL 90 tablet 0  . PROAIR HFA 108 (90 Base) MCG/ACT inhaler 2 PUFFS EVERY 6 HOURS AS NEEDED FOR WHEEZING 25.5 g 4  . sertraline (ZOLOFT) 50 MG tablet TAKE 1 TABLET DAILY 90 tablet 0  . sulfamethoxazole-trimethoprim (BACTRIM DS) 800-160 MG tablet Take 1 tablet by mouth 2 (two) times daily. 20 tablet 0  . tamsulosin (FLOMAX) 0.4 MG CAPS capsule Take 1 capsule (0.4 mg total) by mouth daily after supper. 30 capsule 1  . torsemide (DEMADEX) 20 MG tablet 40 mg in AM, 40 mg afternoon, 20 mg early evening 150 tablet 1  . traMADol (ULTRAM) 50 MG tablet Take 1-2 tablets (50-100 mg total) by mouth every 12 (twelve) hours as needed. 80 tablet 1    Relevant Imaging Results:  Relevant Lab Results:   Additional Information  PPD placed 07/21/2018 at 10:15AM.    [name], LCSW

## 2018-07-21 NOTE — Discharge Instructions (Addendum)
Take your usual prescriptions as previously directed.  Call your regular medical doctor today to schedule a follow up appointment within the next 1 to 2 days.  Return to the Emergency Department immediately sooner if worsening.  ° °
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
## 2018-07-21 NOTE — ED Notes (Signed)
Pt assisted to br, 2 staff assist, partial bath, pt stated too cold to continue cleaning up, pt given new gown and assisted back to bed, bed sheets changed, pt hooked back up to monitor

## 2018-07-21 NOTE — ED Notes (Signed)
Patient up to bedside commode.

## 2018-07-22 ENCOUNTER — Telehealth: Payer: Self-pay | Admitting: *Deleted

## 2018-07-22 DIAGNOSIS — I5032 Chronic diastolic (congestive) heart failure: Secondary | ICD-10-CM

## 2018-07-22 DIAGNOSIS — Z515 Encounter for palliative care: Secondary | ICD-10-CM

## 2018-07-22 DIAGNOSIS — G8929 Other chronic pain: Secondary | ICD-10-CM

## 2018-07-22 DIAGNOSIS — M545 Low back pain, unspecified: Secondary | ICD-10-CM

## 2018-07-22 DIAGNOSIS — W19XXXA Unspecified fall, initial encounter: Secondary | ICD-10-CM

## 2018-07-22 DIAGNOSIS — E039 Hypothyroidism, unspecified: Secondary | ICD-10-CM

## 2018-07-22 DIAGNOSIS — I5043 Acute on chronic combined systolic (congestive) and diastolic (congestive) heart failure: Secondary | ICD-10-CM

## 2018-07-22 DIAGNOSIS — I5042 Chronic combined systolic (congestive) and diastolic (congestive) heart failure: Secondary | ICD-10-CM

## 2018-07-22 DIAGNOSIS — E1143 Type 2 diabetes mellitus with diabetic autonomic (poly)neuropathy: Secondary | ICD-10-CM

## 2018-07-22 DIAGNOSIS — E1142 Type 2 diabetes mellitus with diabetic polyneuropathy: Secondary | ICD-10-CM

## 2018-07-22 DIAGNOSIS — R69 Illness, unspecified: Secondary | ICD-10-CM | POA: Diagnosis not present

## 2018-07-22 MED ORDER — ACCU-CHEK SOFTCLIX LANCETS MISC: 3 refills | Status: DC

## 2018-07-22 MED ORDER — ACCU-CHEK AVIVA PLUS W/DEVICE KIT: PACK | 0 refills | Status: DC

## 2018-07-22 MED ORDER — GLUCOSE BLOOD VI STRP: ORAL_STRIP | 3 refills | Status: DC

## 2018-07-22 NOTE — Telephone Encounter (Signed)
TC from [name] [name] w/ [facility] in [place] Pt was admitted yesterday Needing HH orders for pt for nursing & PT asap She has multiple sores on her that need to be addressed Please advise

## 2018-07-22 NOTE — Addendum Note (Signed)
Addended by: Jannifer Rodney A on: 07/22/2018 03:16 PM   Modules accepted: Orders

## 2018-07-22 NOTE — Telephone Encounter (Signed)
Rx & supplies sent to [facility]

## 2018-07-22 NOTE — Telephone Encounter (Signed)
Home health orders placed.

## 2018-07-24 ENCOUNTER — Telehealth: Payer: Self-pay

## 2018-07-24 DIAGNOSIS — G4730 Sleep apnea, unspecified: Secondary | ICD-10-CM | POA: Diagnosis not present

## 2018-07-24 DIAGNOSIS — E1151 Type 2 diabetes mellitus with diabetic peripheral angiopathy without gangrene: Secondary | ICD-10-CM | POA: Diagnosis not present

## 2018-07-24 DIAGNOSIS — M545 Low back pain: Secondary | ICD-10-CM | POA: Diagnosis not present

## 2018-07-24 DIAGNOSIS — I872 Venous insufficiency (chronic) (peripheral): Secondary | ICD-10-CM | POA: Diagnosis not present

## 2018-07-24 DIAGNOSIS — E1143 Type 2 diabetes mellitus with diabetic autonomic (poly)neuropathy: Secondary | ICD-10-CM | POA: Diagnosis not present

## 2018-07-24 DIAGNOSIS — N3281 Overactive bladder: Secondary | ICD-10-CM | POA: Diagnosis not present

## 2018-07-24 DIAGNOSIS — G8929 Other chronic pain: Secondary | ICD-10-CM | POA: Diagnosis not present

## 2018-07-24 DIAGNOSIS — I5043 Acute on chronic combined systolic (congestive) and diastolic (congestive) heart failure: Secondary | ICD-10-CM | POA: Diagnosis not present

## 2018-07-24 DIAGNOSIS — E1142 Type 2 diabetes mellitus with diabetic polyneuropathy: Secondary | ICD-10-CM | POA: Diagnosis not present

## 2018-07-24 DIAGNOSIS — R69 Illness, unspecified: Secondary | ICD-10-CM | POA: Diagnosis not present

## 2018-07-24 NOTE — Telephone Encounter (Signed)
Encounter opened in error

## 2018-07-25 ENCOUNTER — Telehealth: Payer: Self-pay | Admitting: *Deleted

## 2018-07-25 DIAGNOSIS — I5032 Chronic diastolic (congestive) heart failure: Secondary | ICD-10-CM

## 2018-07-25 DIAGNOSIS — I509 Heart failure, unspecified: Secondary | ICD-10-CM

## 2018-07-25 DIAGNOSIS — R609 Edema, unspecified: Secondary | ICD-10-CM

## 2018-07-25 MED ORDER — TORSEMIDE 20 MG PO TABS: ORAL_TABLET | ORAL | 0 refills | Status: DC

## 2018-07-25 NOTE — Telephone Encounter (Signed)
Received Vm from [facility] needing clarification on pt's order for Torsemide 20 mg. Rx for 2 tabs BID for 7 days, needing to know if this should be continued. Per Dr. Louanne Skye pt should continue normal dosage 20 mg 2 QAM, 2 Q afternoon, 1 Q evening, Rx sent to [facility] electronically

## 2018-07-27 DIAGNOSIS — R69 Illness, unspecified: Secondary | ICD-10-CM | POA: Diagnosis not present

## 2018-07-28 DIAGNOSIS — L97918 Non-pressure chronic ulcer of unspecified part of right lower leg with other specified severity: Secondary | ICD-10-CM | POA: Diagnosis not present

## 2018-07-28 DIAGNOSIS — S82402B Unspecified fracture of shaft of left fibula, initial encounter for open fracture type I or II: Secondary | ICD-10-CM | POA: Diagnosis not present

## 2018-07-28 DIAGNOSIS — I5020 Unspecified systolic (congestive) heart failure: Secondary | ICD-10-CM | POA: Diagnosis not present

## 2018-07-29 DIAGNOSIS — L97918 Non-pressure chronic ulcer of unspecified part of right lower leg with other specified severity: Secondary | ICD-10-CM | POA: Diagnosis not present

## 2018-07-29 DIAGNOSIS — S82402B Unspecified fracture of shaft of left fibula, initial encounter for open fracture type I or II: Secondary | ICD-10-CM | POA: Diagnosis not present

## 2018-07-29 DIAGNOSIS — I5020 Unspecified systolic (congestive) heart failure: Secondary | ICD-10-CM | POA: Diagnosis not present

## 2018-07-30 ENCOUNTER — Other Ambulatory Visit: Payer: Self-pay | Admitting: Family

## 2018-07-30 DIAGNOSIS — M159 Polyosteoarthritis, unspecified: Secondary | ICD-10-CM

## 2018-07-30 DIAGNOSIS — I5032 Chronic diastolic (congestive) heart failure: Secondary | ICD-10-CM

## 2018-07-30 DIAGNOSIS — R69 Illness, unspecified: Secondary | ICD-10-CM | POA: Diagnosis not present

## 2018-07-30 DIAGNOSIS — R600 Localized edema: Secondary | ICD-10-CM

## 2018-07-30 DIAGNOSIS — R609 Edema, unspecified: Secondary | ICD-10-CM

## 2018-07-30 DIAGNOSIS — I509 Heart failure, unspecified: Secondary | ICD-10-CM

## 2018-08-01 ENCOUNTER — Telehealth: Payer: Self-pay | Admitting: Family

## 2018-08-06 ENCOUNTER — Telehealth: Payer: Self-pay | Admitting: *Deleted

## 2018-08-06 ENCOUNTER — Ambulatory Visit (INDEPENDENT_AMBULATORY_CARE_PROVIDER_SITE_OTHER): Payer: Medicare HMO

## 2018-08-06 DIAGNOSIS — I5043 Acute on chronic combined systolic (congestive) and diastolic (congestive) heart failure: Secondary | ICD-10-CM

## 2018-08-06 DIAGNOSIS — F0390 Unspecified dementia without behavioral disturbance: Secondary | ICD-10-CM

## 2018-08-06 DIAGNOSIS — N3281 Overactive bladder: Secondary | ICD-10-CM

## 2018-08-06 DIAGNOSIS — R69 Illness, unspecified: Secondary | ICD-10-CM | POA: Diagnosis not present

## 2018-08-06 DIAGNOSIS — E1151 Type 2 diabetes mellitus with diabetic peripheral angiopathy without gangrene: Secondary | ICD-10-CM | POA: Diagnosis not present

## 2018-08-06 DIAGNOSIS — I872 Venous insufficiency (chronic) (peripheral): Secondary | ICD-10-CM

## 2018-08-06 DIAGNOSIS — G4730 Sleep apnea, unspecified: Secondary | ICD-10-CM

## 2018-08-06 DIAGNOSIS — E1142 Type 2 diabetes mellitus with diabetic polyneuropathy: Secondary | ICD-10-CM | POA: Diagnosis not present

## 2018-08-06 DIAGNOSIS — E1143 Type 2 diabetes mellitus with diabetic autonomic (poly)neuropathy: Secondary | ICD-10-CM | POA: Diagnosis not present

## 2018-08-06 DIAGNOSIS — M545 Low back pain: Secondary | ICD-10-CM | POA: Diagnosis not present

## 2018-08-06 DIAGNOSIS — G8929 Other chronic pain: Secondary | ICD-10-CM | POA: Diagnosis not present

## 2018-08-06 DIAGNOSIS — K219 Gastro-esophageal reflux disease without esophagitis: Secondary | ICD-10-CM

## 2018-08-06 DIAGNOSIS — D649 Anemia, unspecified: Secondary | ICD-10-CM

## 2018-08-06 DIAGNOSIS — F411 Generalized anxiety disorder: Secondary | ICD-10-CM

## 2018-08-06 NOTE — Telephone Encounter (Signed)
VM from [facility] nurse w/ [facility] She saw patient this morning at [facility] Pt's has increased swelling in ankles Measurements today are 21 inches right and 22 inches left On Monday when she saw her right ankle was 21 inches and left was 20 inches Weights have been 2/22 168 lbs, 2/23 169 lbs, 2/24 178 lbs, 2/25 180 lbs, 2/26 178 lbs No SOB, lungs are clear Please advise, if any orders please fax to [facility]

## 2018-08-07 NOTE — Addendum Note (Signed)
Addended by: Jannifer Rodney A on: 08/07/2018 03:25 PM   Modules accepted: Orders

## 2018-08-07 NOTE — Telephone Encounter (Signed)
We need to increase her Demadex. Is she currently taking 40 mg AM, 40 mg afternoon, and 20 mg in the evening? When is the last time she saw her Cardiologists? She needs referral to CHF clinic.   Having any SOB?

## 2018-08-07 NOTE — Telephone Encounter (Signed)
Yes ,she is taking medicine as prescribed and has seen cardiologist. Please do referral for CHF clinic.

## 2018-08-08 ENCOUNTER — Encounter (HOSPITAL_COMMUNITY): Payer: Self-pay | Admitting: Emergency Medicine

## 2018-08-08 ENCOUNTER — Emergency Department (HOSPITAL_COMMUNITY)
Admission: EM | Admit: 2018-08-08 | Discharge: 2018-08-08 | Disposition: A | Discharge status: Home / Self Care | Payer: Medicare HMO | Attending: Emergency Medicine | Admitting: Emergency Medicine

## 2018-08-08 ENCOUNTER — Other Ambulatory Visit: Payer: Self-pay

## 2018-08-08 DIAGNOSIS — E039 Hypothyroidism, unspecified: Secondary | ICD-10-CM | POA: Insufficient documentation

## 2018-08-08 DIAGNOSIS — Z950 Presence of cardiac pacemaker: Secondary | ICD-10-CM | POA: Diagnosis not present

## 2018-08-08 DIAGNOSIS — Z79899 Other long term (current) drug therapy: Secondary | ICD-10-CM | POA: Diagnosis not present

## 2018-08-08 DIAGNOSIS — R5381 Other malaise: Secondary | ICD-10-CM | POA: Insufficient documentation

## 2018-08-08 DIAGNOSIS — Z7982 Long term (current) use of aspirin: Secondary | ICD-10-CM | POA: Diagnosis not present

## 2018-08-08 DIAGNOSIS — E119 Type 2 diabetes mellitus without complications: Secondary | ICD-10-CM | POA: Insufficient documentation

## 2018-08-08 DIAGNOSIS — F0390 Unspecified dementia without behavioral disturbance: Secondary | ICD-10-CM | POA: Diagnosis not present

## 2018-08-08 DIAGNOSIS — R55 Syncope and collapse: Secondary | ICD-10-CM | POA: Diagnosis not present

## 2018-08-08 DIAGNOSIS — I110 Hypertensive heart disease with heart failure: Secondary | ICD-10-CM | POA: Insufficient documentation

## 2018-08-08 DIAGNOSIS — Z96653 Presence of artificial knee joint, bilateral: Secondary | ICD-10-CM | POA: Diagnosis not present

## 2018-08-08 DIAGNOSIS — I5042 Chronic combined systolic (congestive) and diastolic (congestive) heart failure: Secondary | ICD-10-CM | POA: Insufficient documentation

## 2018-08-08 DIAGNOSIS — Z7984 Long term (current) use of oral hypoglycemic drugs: Secondary | ICD-10-CM | POA: Diagnosis not present

## 2018-08-08 DIAGNOSIS — R0689 Other abnormalities of breathing: Secondary | ICD-10-CM | POA: Diagnosis not present

## 2018-08-08 DIAGNOSIS — J449 Chronic obstructive pulmonary disease, unspecified: Secondary | ICD-10-CM | POA: Diagnosis not present

## 2018-08-08 DIAGNOSIS — R69 Illness, unspecified: Secondary | ICD-10-CM | POA: Diagnosis not present

## 2018-08-08 NOTE — ED Notes (Signed)
Given crackers and peanut butter

## 2018-08-08 NOTE — Discharge Instructions (Addendum)
Make sure that you eat 3 meals a day, drink plenty of fluids, and get plenty of rest.  Take all of your medications as directed.  Return here if needed for problems.

## 2018-08-08 NOTE — ED Notes (Signed)
Called [facility] to come get patient  

## 2018-08-08 NOTE — Telephone Encounter (Signed)
CHF clinic referral placed

## 2018-08-08 NOTE — ED Notes (Signed)
Pt is awake and alert and states she was trying to take a nap she was just sleepy

## 2018-08-08 NOTE — ED Triage Notes (Addendum)
Pt from [facility] for poss syncopal episode.  Pt reports to ems that she fell asleep in her wheelchair.  Pt is awake and alert with moments of confusion with is pt's baseline.  cbg 160's

## 2018-08-08 NOTE — ED Provider Notes (Signed)
[facility] EMERGENCY DEPARTMENT Provider Note   CSN: [id number] Arrival date & time: 08/08/18  1140    History   Chief Complaint Chief Complaint  Patient presents with  . Loss of Consciousness    HPI [name] [name] is a 85 y.o. female.  Patient's 2 daughters are here with her in the emergency department during the history taking portion of the evaluation.    HPI   Patient presents for evaluation from her nursing home where she was in a wheelchair and thought to have a syncopal episode.  She was transferred by EMS without any interventions.  The patient states that she was "just asleep in the chair because I was awake last night."  Patient feels well.  She states she ate breakfast and is hungry, now that it is lunchtime.  She denies headache, chest pain, shortness of breath, weakness, dizziness, paresthesia.  She is here with family members, 2 daughters.  There are no other known modifying factors.   Past Medical History:  Diagnosis Date  . ANXIETY   . Arteriosclerotic cardiovascular disease (ASCVD)    Cath in 9/99-70% mid LAD with diffuse distal disease, 70% T1, 60% mid circumflex, 50% mid RCA, normal ejection fraction; negative stress nuclear in 07/2008  . Carpal tunnel syndrome   . Cerebrovascular disease    carotid bruits; no focal disease in 1999, 2002 and 2006  . Closed left fibular fracture 11/30/11  . COPD   . Dementia ([place])   . Diabetes mellitus    Insulin treatment  . Diastolic CHF, chronic (HCC)    Normal EF  . Gastroesophageal reflux disease    With hiatal hernia; irritable bowel syndrome; H. pylori-treated; Colonoscopy 2008: non-specific colitis, IH, Diverticulosis, Rectal ulcer secondary to ASA  . HOH (hard of hearing)   . Hyperlipidemia   . Hypertension   . Hypothyroidism   . OSTEOARTHRITIS, KNEES, BILATERAL    Bilateral TKA  . Pacemaker   . Peripheral neuropathy   . Sinoatrial node dysfunction ([place]) 2003   Medtronic dual-chamber device  . Sleep apnea     pt could not tolerate  . VITAMIN B12 DEFICIENCY     Patient Active Problem List   Diagnosis Date Noted  . Palliative care by specialist   . DNR (do not resuscitate) discussion   . CHF exacerbation ([place]) 02/27/2018  . Fall at home, initial encounter 02/23/2018  . Fall 02/23/2018  . Aortic atherosclerosis ([place]) 10/15/2017  . Chronic combined systolic (congestive) and diastolic (congestive) heart failure ([place]) 07/02/2017  . Pleural effusion   . Palliative care encounter   . Goals of care, counseling/discussion   . Pressure injury of skin 06/29/2017  . Elevated troponin 06/28/2017  . Normocytic anemia 06/28/2017  . Sleep apnea 06/28/2017  . Tinea cruris 06/28/2017  . Hyperlipidemia associated with type 2 diabetes mellitus ([place]) 05/14/2017  . Chronic back pain 01/28/2017  . PVD (peripheral vascular disease) ([place]) 01/27/2016  . Skin moniliasis 05/31/2014  . OAB (overactive bladder) 12/23/2012  . Insomnia 04/06/2011  . Sinoatrial node dysfunction (HCC)   . Gastroesophageal reflux disease   . Type II diabetes mellitus with peripheral autonomic neuropathy (HCC)   . GAD (generalized anxiety disorder) 12/30/2009  . Diabetic neuropathy ([place]) 12/26/2009  . Vitamin B 12 deficiency 10/11/2009  . Morbid obesity ([place]) 09/16/2009  . Arteriosclerotic cardiovascular disease (ASCVD) 09/15/2009  . CEREBROVASCULAR DISEASE 09/15/2009  . Hypertension associated with diabetes ([place]) 08/29/2009  . Osteoarthrosis involving lower leg 08/29/2009  . COLONIC POLYPS,  ADENOMATOUS, HX OF 02/22/2009  . Hypothyroid 02/17/2009  . Chronic obstructive pulmonary disease ([place]) 02/17/2009  . Chronic diastolic heart failure ([place]) 12/15/2008  . PPM-Medtronic 12/15/2008    Past Surgical History:  Procedure Laterality Date  . ABDOMINAL HYSTERECTOMY    . BLADDER REPAIR    . CARDIAC PACEMAKER PLACEMENT  2003   Medtronic, dual-chamber  . CARPAL TUNNEL RELEASE    . CATARACT EXTRACTION W/PHACO Left 08/09/2017    Procedure: CATARACT EXTRACTION PHACO AND INTRAOCULAR LENS PLACEMENT (IOC);  Surgeon: Baruch Goldmann, MD;  Location: [facility];  Service: Ophthalmology;  Laterality: Left;  CDE: 6.26  . CHOLECYSTECTOMY    . COLONOSCOPY  2008  . HEMORRHOID SURGERY    . NASAL FRACTURE SURGERY    . THYROIDECTOMY  1980   Goiter  . TOTAL KNEE ARTHROPLASTY     Bilateral     OB History   No obstetric history on file.      Home Medications    Prior to Admission medications   Medication Sig Start Date End Date Taking? Authorizing Provider  ACCU-CHEK SOFTCLIX LANCETS lancets Test sugars twice daily 07/22/18   Evelina Dun A, FNP  acetaminophen (TYLENOL) 325 MG tablet Take 2 tablets (650 mg total) by mouth every 6 (six) hours as needed for mild pain, moderate pain, fever or headache. 07/21/18   Francine Graven, DO  albuterol (PROVENTIL HFA;VENTOLIN HFA) 108 (90 Base) MCG/ACT inhaler Inhale 2 puffs into the lungs every 6 (six) hours as needed for wheezing or shortness of breath. 07/21/18   Francine Graven, DO  ALPRAZolam Duanne Moron) 0.5 MG tablet Take 1 tablet (0.5 mg total) by mouth at bedtime as needed for anxiety. 07/21/18   Francine Graven, DO  aspirin EC 81 MG tablet Take 81 mg by mouth daily. Reported on 12/14/2015    [provider]  Blood Glucose Monitoring Suppl (ACCU-CHEK AVIVA PLUS) w/Device KIT Test sugars twice daily 07/22/18   Evelina Dun A, FNP  carvedilol (COREG) 12.5 MG tablet Take 1 tablet (12.5 mg total) by mouth 2 (two) times daily with a meal. 07/21/18   Francine Graven, DO  fluticasone furoate-vilanterol (BREO ELLIPTA) 100-25 MCG/INH AEPB Inhale 1 puff into the lungs every morning. 07/21/18   Francine Graven, DO  gabapentin (NEURONTIN) 300 MG capsule Take 1 capsule (300 mg total) by mouth 3 (three) times daily for 15 days. 07/21/18 08/05/18  Francine Graven, DO  glucose blood (ACCU-CHEK AVIVA PLUS) test strip Test sugars twice daily 07/22/18   Evelina Dun A, FNP  levothyroxine  (SYNTHROID, LEVOTHROID) 200 MCG tablet TAKE (1) TABLET DAILY BE- FORE BREAKFAST. 05/13/18   Evelina Dun A, FNP  lisinopril (PRINIVIL,ZESTRIL) 20 MG tablet Take 1 tablet (20 mg total) by mouth daily. 07/21/18   Francine Graven, DO  metFORMIN (GLUCOPHAGE) 1000 MG tablet Take 1 tablet (1,000 mg total) by mouth 2 (two) times daily. 07/21/18   Francine Graven, DO  nitroGLYCERIN (NITROLINGUAL) 0.4 MG/SPRAY spray Place 1 spray under the tongue every 5 (five) minutes x 3 doses as needed for chest pain. 07/21/18   Francine Graven, DO  omeprazole (PRILOSEC) 20 MG capsule Take 1 capsule (20 mg total) by mouth daily. 07/21/18   Francine Graven, DO  pravastatin (PRAVACHOL) 80 MG tablet Take 1 tablet (80 mg total) by mouth daily. 07/21/18   Francine Graven, DO  PROAIR HFA 108 [phone] Base) MCG/ACT inhaler 2 PUFFS EVERY 6 HOURS AS NEEDED FOR WHEEZING 08/07/17   Evelina Dun A, FNP  sertraline (ZOLOFT) 50 MG tablet  Take 1 tablet (50 mg total) by mouth daily. 07/21/18   Francine Graven, DO  tamsulosin (FLOMAX) 0.4 MG CAPS capsule Take 1 capsule (0.4 mg total) by mouth daily after supper. 02/25/18   Roxan Hockey, MD  torsemide (DEMADEX) 20 MG tablet 40 mg in AM, 40 mg afternoon, 20 mg early evening 07/25/18   Dettinger, Fransisca Kaufmann, MD  traMADol (ULTRAM) 50 MG tablet Take 1-2 tablets (50-100 mg total) by mouth every 12 (twelve) hours as needed. 06/25/18   Sharion Balloon, FNP    Family History Family History  Problem Relation Age of Onset  . COPD Father   . Heart attack Father 13  . Heart failure Mother   . Cancer Sister        colon  . Cancer Brother        lung  . Cancer Sister        bladder  . Cancer Brother        lung  . Early death Son   . Diabetes Son   . Heart disease Son   . Cancer Son        prostate  . Cancer Sister        kidney cancer  . Diabetes Sister   . Cancer Daughter     Social History Social History   Tobacco Use  . Smoking status: Never Smoker  . Smokeless tobacco:  Never Used  Substance Use Topics  . Alcohol use: No    Alcohol/week: 0.0 standard drinks  . Drug use: No     Allergies   Penicillins   Review of Systems Review of Systems  All other systems reviewed and are negative.    Physical Exam Updated Vital Signs BP (!) 120/51 (BP Location: Left Arm)   Pulse 77   Temp 98 F (36.7 C) (Oral)   Resp 18   Ht _0  (1.778 m)   Wt 92 kg   SpO2 97%   BMI 29.10 kg/m   Physical Exam Vitals signs and nursing note reviewed.  Constitutional:      General: She is not in acute distress.    Appearance: She is well-developed. She is not ill-appearing, toxic-appearing or diaphoretic.     Comments: Friendly, jovial and interactive.  HENT:     Head: Normocephalic and atraumatic.     Right Ear: External ear normal.     Left Ear: External ear normal.     Nose: No congestion or rhinorrhea.     Mouth/Throat:     Mouth: Mucous membranes are moist.  Eyes:     Conjunctiva/sclera: Conjunctivae normal.     Pupils: Pupils are equal, round, and reactive to light.  Neck:     Musculoskeletal: Normal range of motion and neck supple.     Trachea: Phonation normal.  Cardiovascular:     Rate and Rhythm: Normal rate and regular rhythm.     Heart sounds: Normal heart sounds. No friction rub.  Pulmonary:     Effort: Pulmonary effort is normal. No respiratory distress.     Breath sounds: Normal breath sounds.  Abdominal:     Tenderness: There is no abdominal tenderness.  Musculoskeletal: Normal range of motion.     Comments: Normal strength arms and legs bilaterally.  Skin:    General: Skin is warm and dry.  Neurological:     Mental Status: She is alert and oriented to person, place, and time.     Cranial Nerves: No cranial nerve deficit.  Sensory: No sensory deficit.     Motor: No abnormal muscle tone.     Coordination: Coordination normal.     Comments: No dysarthria, aphasia or nystagmus.  Psychiatric:        Behavior: Behavior normal.          Thought Content: Thought content normal.        Judgment: Judgment normal.      ED Treatments / Results  Labs (all labs ordered are listed, but only abnormal results are displayed) Labs Reviewed - No data to display  EKG None  Radiology No results found.  Procedures Procedures (including critical care time)  Medications Ordered in ED Medications - No data to display   Initial Impression / Assessment and Plan / ED Course  I have reviewed the triage vital signs and the nursing notes.  Pertinent labs & imaging results that were available during my care of the patient were reviewed by me and considered in my medical decision making (see chart for details).         Patient Vitals for the past 24 hrs:  BP Temp Temp src Pulse Resp SpO2 Height Weight  08/08/18 1146 (!) 120/51 98 F (36.7 C) Oral 77 18 97 % - -  08/08/18 1144 - - - - - - _0  (1.778 m) 92 kg    12:28 PM Reevaluation with update and discussion. After initial assessment and treatment, an updated evaluation reveals no change in clinical status.  She was able to ambulate easily in the hall, on her own.  Findings discussed with the patient and all questions were answered. Daleen Bo   Medical Decision Making: Patient sleeping and thought to have had a syncopal episode.  There is no evidence that she had a syncopal episode.  Doubt seizure, serious bacterial infection, metabolic instability or impending vascular collapse.  CRITICAL CARE- no Performed by: Daleen Bo   Nursing Notes Reviewed/ Care Coordinated Applicable Imaging Reviewed Interpretation of Laboratory Data incorporated into ED treatment  The patient appears reasonably screened and/or stabilized for discharge and I doubt any other medical condition or other [facility] requiring further screening, evaluation, or treatment in the ED at this time prior to discharge.  Plan: Home Medications-continue usual medications; Home Treatments-usual diet,  push fluids and regular sleep; return here if the recommended treatment, does not improve the symptoms; Recommended follow up-PCP, PRN     Final Clinical Impressions(s) / ED Diagnoses   Final diagnoses:  [facility]    ED Discharge Orders    None       Daleen Bo, MD 08/08/18 1228

## 2018-08-08 NOTE — ED Notes (Signed)
Pt ambulated to restroom and back to bed in the hallway with no problems.  Steady gait.

## 2018-08-08 NOTE — Addendum Note (Signed)
Addended by: Jannifer Rodney A on: 08/08/2018 10:08 AM   Modules accepted: Orders

## 2018-08-09 DIAGNOSIS — S82402B Unspecified fracture of shaft of left fibula, initial encounter for open fracture type I or II: Secondary | ICD-10-CM | POA: Diagnosis not present

## 2018-08-09 DIAGNOSIS — I5020 Unspecified systolic (congestive) heart failure: Secondary | ICD-10-CM | POA: Diagnosis not present

## 2018-08-11 ENCOUNTER — Encounter: Payer: Self-pay | Admitting: Family

## 2018-08-11 ENCOUNTER — Ambulatory Visit (INDEPENDENT_AMBULATORY_CARE_PROVIDER_SITE_OTHER): Payer: Medicare HMO | Admitting: Family

## 2018-08-11 VITALS — BP 139/72 | HR 60 | Temp 96.8°F | Ht 70.0 in | Wt 172.0 lb

## 2018-08-11 DIAGNOSIS — E1169 Type 2 diabetes mellitus with other specified complication: Secondary | ICD-10-CM | POA: Diagnosis not present

## 2018-08-11 DIAGNOSIS — R197 Diarrhea, unspecified: Secondary | ICD-10-CM

## 2018-08-11 DIAGNOSIS — Z111 Encounter for screening for respiratory tuberculosis: Secondary | ICD-10-CM | POA: Diagnosis not present

## 2018-08-11 DIAGNOSIS — E1142 Type 2 diabetes mellitus with diabetic polyneuropathy: Secondary | ICD-10-CM | POA: Diagnosis not present

## 2018-08-11 DIAGNOSIS — I5042 Chronic combined systolic (congestive) and diastolic (congestive) heart failure: Secondary | ICD-10-CM

## 2018-08-11 DIAGNOSIS — K2100 Gastro-esophageal reflux disease with esophagitis, without bleeding: Secondary | ICD-10-CM

## 2018-08-11 DIAGNOSIS — E039 Hypothyroidism, unspecified: Secondary | ICD-10-CM

## 2018-08-11 DIAGNOSIS — E1143 Type 2 diabetes mellitus with diabetic autonomic (poly)neuropathy: Secondary | ICD-10-CM | POA: Diagnosis not present

## 2018-08-11 DIAGNOSIS — I5043 Acute on chronic combined systolic (congestive) and diastolic (congestive) heart failure: Secondary | ICD-10-CM | POA: Diagnosis not present

## 2018-08-11 DIAGNOSIS — N3281 Overactive bladder: Secondary | ICD-10-CM | POA: Diagnosis not present

## 2018-08-11 DIAGNOSIS — I152 Hypertension secondary to endocrine disorders: Secondary | ICD-10-CM

## 2018-08-11 DIAGNOSIS — E1151 Type 2 diabetes mellitus with diabetic peripheral angiopathy without gangrene: Secondary | ICD-10-CM | POA: Diagnosis not present

## 2018-08-11 DIAGNOSIS — F411 Generalized anxiety disorder: Secondary | ICD-10-CM

## 2018-08-11 DIAGNOSIS — I872 Venous insufficiency (chronic) (peripheral): Secondary | ICD-10-CM | POA: Diagnosis not present

## 2018-08-11 DIAGNOSIS — E1159 Type 2 diabetes mellitus with other circulatory complications: Secondary | ICD-10-CM | POA: Diagnosis not present

## 2018-08-11 DIAGNOSIS — G4730 Sleep apnea, unspecified: Secondary | ICD-10-CM | POA: Diagnosis not present

## 2018-08-11 DIAGNOSIS — G8929 Other chronic pain: Secondary | ICD-10-CM | POA: Diagnosis not present

## 2018-08-11 DIAGNOSIS — M545 Low back pain: Secondary | ICD-10-CM | POA: Diagnosis not present

## 2018-08-11 DIAGNOSIS — R69 Illness, unspecified: Secondary | ICD-10-CM | POA: Diagnosis not present

## 2018-08-11 DIAGNOSIS — I10 Essential (primary) hypertension: Secondary | ICD-10-CM | POA: Diagnosis not present

## 2018-08-11 DIAGNOSIS — E785 Hyperlipidemia, unspecified: Secondary | ICD-10-CM | POA: Diagnosis not present

## 2018-08-11 MED ORDER — LOPERAMIDE HCL 2 MG PO TABS: 2.0000 mg | ORAL_TABLET | Freq: Four times a day (QID) | ORAL | 3 refills | Status: AC | PRN

## 2018-08-11 NOTE — Patient Instructions (Signed)
Heart Failure °Heart failure is a condition in which the heart has trouble pumping blood because it has become weak or stiff. This means that the heart does not pump blood efficiently for the body to work well. For some people with heart failure, fluid may back up into the lungs and there may be swelling (edema) in the lower legs. Heart failure is usually a long-term (chronic) condition. It is important for you to take good care of yourself and follow the treatment plan from your health care provider. °What are the causes? °This condition is caused by some health problems, including: °· High blood pressure (hypertension). Hypertension causes the heart muscle to work harder than normal. High blood pressure eventually causes the heart to become stiff and weak. °· Coronary artery disease (CAD). CAD is the buildup of cholesterol and fat (plaques) in the arteries of the heart. °· Heart attack (myocardial infarction). Injured tissue, which is caused by the heart attack, does not contract as well and the heart's ability to pump blood is weakened. °· Abnormal heart valves. When the heart valves do not open and close properly, the heart muscle must pump harder to keep the blood flowing. °· Heart muscle disease (cardiomyopathy or myocarditis). Heart muscle disease is damage to the heart muscle from a variety of causes, such as drug or alcohol abuse, infections, or unknown causes. These can increase the risk of heart failure. °· Lung disease. When the lungs do not work properly, the heart must work harder. °What increases the risk? °Risk of heart failure increases as a person ages. This condition is also more likely to develop in people who: °· Are overweight. °· Are female. °· Smoke or chew tobacco. °· Abuse alcohol or illegal drugs. °· Have taken medicines that can damage the heart, such as chemotherapy drugs. °· Have diabetes. °? High blood sugar (glucose) is associated with high fat (lipid) levels in the blood. °? Diabetes  can also damage tiny blood vessels that carry nutrients to the heart muscle. °· Have abnormal heart rhythms. °· Have thyroid problems. °· Have low blood counts (anemia). °What are the signs or symptoms? °Symptoms of this condition include: °· Shortness of breath with activity, such as when climbing stairs. °· Persistent cough. °· Swelling of the feet, ankles, legs, or abdomen. °· Unexplained weight gain. °· Difficulty breathing when lying flat (orthopnea). °· Waking from sleep because of the need to sit up and get more air. °· Rapid heartbeat. °· Fatigue and loss of energy. °· Feeling light-headed, dizzy, or close to fainting. °· Loss of appetite. °· Nausea. °· Increased urination during the night (nocturia). °· Confusion. °How is this diagnosed? °This condition is diagnosed based on: °· Medical history, symptoms, and a physical exam. °· Diagnostic tests, which may include: °? Echocardiogram. °? Electrocardiogram (ECG). °? Chest X-ray. °? Blood tests. °? Exercise stress test. °? Radionuclide scans. °? Cardiac catheterization and angiogram. °How is this treated? °Treatment for this condition is aimed at managing the symptoms of heart failure. Medicines, behavioral changes, or other treatments may be necessary to treat heart failure. °Medicines °These may include: °· Angiotensin-converting enzyme (ACE) inhibitors. This type of medicine blocks the effects of a blood protein called angiotensin-converting enzyme. ACE inhibitors relax (dilate) the blood vessels and help to lower blood pressure. °· Angiotensin receptor blockers (ARBs). This type of medicine blocks the actions of a blood protein called angiotensin. ARBs dilate the blood vessels and help to lower blood pressure. °· Water pills (diuretics). Diuretics cause   the kidneys to remove salt and water from the blood. The extra fluid is removed through urination, leaving a lower volume of blood that the heart has to pump. °· Beta blockers. These improve heart muscle  strength and they prevent the heart from beating too quickly. °· Digoxin. This increases the force of the heartbeat. °Healthy behavior changes °These may include: °· Reaching and maintaining a healthy weight. °· Stopping smoking or chewing tobacco. °· Eating heart-healthy foods. °· Limiting or avoiding alcohol. °· Stopping use of street drugs (illegal drugs). °· Physical activity. °Other treatments °These may include: °· Surgery to open blocked coronary arteries or repair damaged heart valves. °· Placement of a biventricular pacemaker to improve heart muscle function (cardiac resynchronization therapy). This device paces both the right ventricle and left ventricle. °· Placement of a device to treat serious abnormal heart rhythms (implantable cardioverter defibrillator, or ICD). °· Placement of a device to improve the pumping ability of the heart (left ventricular assist device, or LVAD). °· Heart transplant. This can cure heart failure, and it is considered for certain patients who do not improve with other therapies. °Follow these instructions at home: °Medicines °· Take over-the-counter and prescription medicines only as told by your health care provider. Medicines are important in reducing the workload of your heart, slowing the progression of heart failure, and improving your symptoms. °? Do not stop taking your medicine unless your health care provider told you to do that. °? Do not skip any dose of medicine. °? Refill your prescriptions before you run out of medicine. You need your medicines every day. °Eating and drinking ° °· Eat heart-healthy foods. Talk with a dietitian to make an eating plan that is right for you. °? Choose foods that contain no trans fat and are low in saturated fat and cholesterol. Healthy choices include fresh or frozen fruits and vegetables, fish, lean meats, legumes, fat-free or low-fat dairy products, and whole-grain or high-fiber foods. °? Limit salt (sodium) if directed by your  health care provider. Sodium restriction may reduce symptoms of heart failure. Ask a dietitian to recommend heart-healthy seasonings. °? Use healthy cooking methods instead of frying. Healthy methods include roasting, grilling, broiling, baking, poaching, steaming, and stir-frying. °· Limit your fluid intake if directed by your health care provider. Fluid restriction may reduce symptoms of heart failure. °Lifestyle ° °· Stop smoking or using chewing tobacco. Nicotine and tobacco can damage your heart and your blood vessels. Do not use nicotine gum or patches before talking to your health care provider. °· Limit alcohol intake to no more than 1 drink per day for non-pregnant women and 2 drinks per day for men. One drink equals 12 oz of beer, 5 oz of wine, or 1½ oz of hard liquor. °? Drinking more than that is harmful to your heart. Tell your health care provider if you drink alcohol several times a week. °? Talk with your health care provider about whether any level of alcohol use is safe for you. °? If your heart has already been damaged by alcohol or you have severe heart failure, drinking alcohol should be stopped completely. °· Stop use of illegal drugs. °· Lose weight if directed by your health care provider. Weight loss may reduce symptoms of heart failure. °· Do moderate physical activity if directed by your health care provider. People who are elderly and people with severe heart failure should consult with a health care provider for physical activity recommendations. °Monitor important information ° °· Weigh   yourself every day. Keeping track of your weight daily helps you to notice excess fluid sooner. °? Weigh yourself every morning after you urinate and before you eat breakfast. °? Wear the same amount of clothing each time you weigh yourself. °? Record your daily weight. Provide your health care provider with your weight record. °· Monitor and record your blood pressure as told by your health care  provider. °· Check your pulse as told by your health care provider. °Dealing with extreme temperatures °· If the weather is extremely hot: °? Avoid vigorous physical activity. °? Use air conditioning or fans or seek a cooler location. °? Avoid caffeine and alcohol. °? Wear loose-fitting, lightweight, and light-colored clothing. °· If the weather is extremely cold: °? Avoid vigorous physical activity. °? Layer your clothes. °? Wear mittens or gloves, a hat, and a scarf when you go outside. °? Avoid alcohol. °General instructions °· Manage other health conditions such as hypertension, diabetes, thyroid disease, or abnormal heart rhythms as told by your health care provider. °· Learn to manage stress. If you need help to do this, ask your health care provider. °· Plan rest periods when fatigued. °· Get ongoing education and support as needed. °· Participate in or seek rehabilitation as needed to maintain or improve independence and quality of life. °· Stay up to date with immunizations. Keeping current on pneumococcal and influenza immunizations is especially important to prevent respiratory infections. °· Keep all follow-up visits as told by your health care provider. This is important. °Contact a health care provider if: °· You have a rapid weight gain. °· You have increasing shortness of breath that is unusual for you. °· You are unable to participate in your usual physical activities. °· You tire easily. °· You cough more than normal, especially with physical activity. °· You have any swelling or more swelling in areas such as your hands, feet, ankles, or abdomen. °· You are unable to sleep because it is hard to breathe. °· You feel like your heart is beating quickly (palpitations). °· You become dizzy or light-headed when you stand up. °Get help right away if: °· You have difficulty breathing. °· You notice or your family notices a change in your awareness, such as having trouble staying awake or having difficulty  with concentration. °· You have pain or discomfort in your chest. °· You have an episode of fainting (syncope). °This information is not intended to replace advice given to you by your health care provider. Make sure you discuss any questions you have with your health care provider. °Document Released: 05/28/2005 Document Revised: 04/26/2017 Document Reviewed: 12/21/2015 °[employer] Interactive Patient Education © 2019 [employer] ° °

## 2018-08-11 NOTE — Progress Notes (Signed)
Subjective:    Patient ID: [name] [name], female    DOB: Jan 23, 1933, 85 y.o.   MRN: [id number]  Chief Complaint  Patient presents with  . paperwork from assisted living    needs script for immodium  . PPD Reading   Pt presents to the office today for paperwork to be completed for SNF. She is currently living at [facility] since end of 06/2018.  Diabetes  She presents for her follow-up diabetic visit. She has type 2 diabetes mellitus. Hypoglycemia symptoms include nervousness/anxiousness. Associated symptoms include fatigue. Pertinent negatives for diabetes include no blurred vision, no foot paresthesias and no visual change. Symptoms are stable. Diabetic complications include heart disease. Risk factors for coronary artery disease include diabetes mellitus, dyslipidemia, hypertension and sedentary lifestyle. She is following a diabetic diet.  Congestive Heart Failure  Presents for follow-up visit. Associated symptoms include edema, fatigue, shortness of breath and unexpected weight change. The symptoms have been stable.  Hypertension  This is a chronic problem. The current episode started more than 1 year ago. The problem has been resolved since onset. The problem is controlled. Associated symptoms include anxiety, malaise/fatigue, peripheral edema and shortness of breath. Pertinent negatives include no blurred vision. Risk factors for coronary artery disease include dyslipidemia, obesity and sedentary lifestyle. Hypertensive end-organ damage includes CAD/MI and heart failure. Identifiable causes of hypertension include a thyroid problem.  Hyperlipidemia  This is a chronic problem. The current episode started more than 1 year ago. The problem is controlled. Recent lipid tests were reviewed and are normal. Exacerbating diseases include obesity. Associated symptoms include shortness of breath. Current antihyperlipidemic treatment includes statins. The current treatment provides moderate  improvement of lipids. Risk factors for coronary artery disease include diabetes mellitus, dyslipidemia, female sex and a sedentary lifestyle.  Thyroid Problem  Presents for follow-up visit. Symptoms include anxiety, diarrhea and fatigue. Patient reports no depressed mood or visual change. The symptoms have been stable. Her past medical history is significant for heart failure and hyperlipidemia.  Anxiety  Presents for follow-up visit. Symptoms include excessive worry, irritability, nervous/anxious behavior and shortness of breath. Patient reports no depressed mood. Symptoms occur occasionally. The severity of symptoms is moderate.    Arthritis  Presents for follow-up visit. She complains of pain and stiffness. The symptoms have been stable. Affected location: back. Her pain is at a severity of 0/10. Associated symptoms include diarrhea and fatigue.  Diarrhea   This is a recurrent problem. The current episode started more than 1 month ago. The problem occurs 2 to 4 times per day. The problem has been waxing and waning. The stool consistency is described as watery.      Review of Systems  Constitutional: Positive for fatigue, irritability, malaise/fatigue and unexpected weight change.  Eyes: Negative for blurred vision.  Respiratory: Positive for shortness of breath.   Gastrointestinal: Positive for diarrhea.  Musculoskeletal: Positive for arthritis and stiffness.  Psychiatric/Behavioral: The patient is nervous/anxious.   All other systems reviewed and are negative.      Objective:   Physical Exam Vitals signs reviewed.  Constitutional:      General: She is not in acute distress.    Appearance: She is well-developed.  HENT:     Head: Normocephalic and atraumatic.     Right Ear: External ear normal.  Eyes:     Pupils: Pupils are equal, round, and reactive to light.  Neck:     Musculoskeletal: Normal range of motion and neck supple.  Thyroid: No thyromegaly.  Cardiovascular:      Rate and Rhythm: Normal rate and regular rhythm.     Heart sounds: Murmur present.  Pulmonary:     Effort: Pulmonary effort is normal. No respiratory distress.     Breath sounds: Normal breath sounds. No wheezing.  Abdominal:     General: Bowel sounds are normal. There is no distension.     Palpations: Abdomen is soft.     Tenderness: There is no abdominal tenderness.  Musculoskeletal:        General: No tenderness.     Right lower leg: Edema (2+) present.     Left lower leg: Edema (2+) present.     Comments: Generalized weakness, in wheelchair   Skin:    General: Skin is warm and dry.  Neurological:     Mental Status: She is alert and oriented to person, place, and time.     Cranial Nerves: No cranial nerve deficit.     Deep Tendon Reflexes: Reflexes are normal and symmetric.  Psychiatric:        Behavior: Behavior normal.        Thought Content: Thought content normal.        Judgment: Judgment normal.       BP 139/72   Pulse 60   Temp (!) 96.8 F (36 C) (Oral)   Ht '5\' 10"'  (1.778 m)   Wt 172 lb (78 kg)   BMI 24.68 kg/m      Assessment & Plan:  [name] comes in today with chief complaint of paperwork from assisted living (needs script for immodium) and PPD Reading   Diagnosis and orders addressed:  1. Acute on chronic combined systolic and diastolic congestive heart failure (HCC) - CMP14+EGFR - CBC with Differential/Platelet  2. Screening for tuberculosis - TB Skin Test - CMP14+EGFR - CBC with Differential/Platelet  3. Chronic combined systolic (congestive) and diastolic (congestive) heart failure (HCC) - CMP14+EGFR - CBC with Differential/Platelet  4. Hypertension associated with diabetes ([place]) - CMP14+EGFR - CBC with Differential/Platelet  5. Gastroesophageal reflux disease with esophagitis - CMP14+EGFR - CBC with Differential/Platelet  6. Hypothyroidism, unspecified type - CMP14+EGFR - CBC with Differential/Platelet - TSH  7.  Hyperlipidemia associated with type 2 diabetes mellitus (HCC) - CMP14+EGFR - CBC with Differential/Platelet - Lipid panel  8. GAD (generalized anxiety disorder) - CMP14+EGFR - CBC with Differential/Platelet  9. Diarrhea, unspecified type - loperamide (IMODIUM A-D) 2 MG tablet; Take 1 tablet (2 mg total) by mouth 4 (four) times daily as needed for diarrhea or loose stools.  Dispense: 60 tablet; Refill: 3 - CMP14+EGFR - CBC with Differential/Platelet   Labs pending Health Maintenance reviewed Diet and exercise encouraged  Follow up plan: 6 months    Evelina Dun, FNP

## 2018-08-12 LAB — CBC WITH DIFFERENTIAL/PLATELET
Basophils Absolute: 0 10*3/uL (ref 0.0–0.2)
Basos: 1 %
EOS (ABSOLUTE): 0.1 10*3/uL (ref 0.0–0.4)
Eos: 2 %
Hematocrit: 31.7 % — ABNORMAL LOW (ref 34.0–46.6)
Hemoglobin: 10 g/dL — ABNORMAL LOW (ref 11.1–15.9)
Immature Grans (Abs): 0 10*3/uL (ref 0.0–0.1)
Immature Granulocytes: 0 %
Lymphocytes Absolute: 1.9 10*3/uL (ref 0.7–3.1)
Lymphs: 32 %
MCH: 27.5 pg (ref 26.6–33.0)
MCHC: 31.5 g/dL (ref 31.5–35.7)
MCV: 87 fL (ref 79–97)
Monocytes Absolute: 0.5 10*3/uL (ref 0.1–0.9)
Monocytes: 9 %
Neutrophils Absolute: 3.2 10*3/uL (ref 1.4–7.0)
Neutrophils: 56 %
Platelets: 258 10*3/uL (ref 150–450)
RBC: 3.63 x10E6/uL — ABNORMAL LOW (ref 3.77–5.28)
RDW: 16.7 % — ABNORMAL HIGH (ref 11.7–15.4)
WBC: 5.7 10*3/uL (ref 3.4–10.8)

## 2018-08-12 LAB — LIPID PANEL
CHOL/HDL RATIO: 2.2 ratio (ref 0.0–4.4)
Cholesterol, Total: 163 mg/dL (ref 100–199)
HDL: 73 mg/dL (ref >39)
LDL Calculated: 69 mg/dL (ref 0–99)
Triglycerides: 104 mg/dL (ref 0–149)
VLDL Cholesterol Cal: 21 mg/dL (ref 5–40)

## 2018-08-12 LAB — CMP14+EGFR
ALK PHOS: 93 IU/L (ref 39–117)
ALT: 15 IU/L (ref 0–32)
AST: 25 IU/L (ref 0–40)
Albumin/Globulin Ratio: 1.6 (ref 1.2–2.2)
Albumin: 4.1 g/dL (ref 3.6–4.6)
BUN / CREAT RATIO: 39 — AB (ref 12–28)
BUN: 53 mg/dL — ABNORMAL HIGH (ref 8–27)
Bilirubin Total: 0.3 mg/dL (ref 0.0–1.2)
CO2: 27 mmol/L (ref 20–29)
Calcium: 9.4 mg/dL (ref 8.7–10.3)
Chloride: 94 mmol/L — ABNORMAL LOW (ref 96–106)
Creatinine, Ser: 1.37 mg/dL — ABNORMAL HIGH (ref 0.57–1.00)
GFR calc Af Amer: 41 mL/min/{1.73_m2} — ABNORMAL LOW (ref >59)
GFR calc non Af Amer: 36 mL/min/{1.73_m2} — ABNORMAL LOW (ref >59)
Globulin, Total: 2.6 g/dL (ref 1.5–4.5)
Glucose: 88 mg/dL (ref 65–99)
Potassium: 4.1 mmol/L (ref 3.5–5.2)
SODIUM: 140 mmol/L (ref 134–144)
Total Protein: 6.7 g/dL (ref 6.0–8.5)

## 2018-08-12 LAB — TSH: TSH: 4.14 u[IU]/mL (ref 0.450–4.500)

## 2018-08-14 DIAGNOSIS — R69 Illness, unspecified: Secondary | ICD-10-CM | POA: Diagnosis not present

## 2018-08-14 DIAGNOSIS — I872 Venous insufficiency (chronic) (peripheral): Secondary | ICD-10-CM | POA: Diagnosis not present

## 2018-08-14 DIAGNOSIS — E1151 Type 2 diabetes mellitus with diabetic peripheral angiopathy without gangrene: Secondary | ICD-10-CM | POA: Diagnosis not present

## 2018-08-14 DIAGNOSIS — E1142 Type 2 diabetes mellitus with diabetic polyneuropathy: Secondary | ICD-10-CM | POA: Diagnosis not present

## 2018-08-14 DIAGNOSIS — G8929 Other chronic pain: Secondary | ICD-10-CM | POA: Diagnosis not present

## 2018-08-14 DIAGNOSIS — E1143 Type 2 diabetes mellitus with diabetic autonomic (poly)neuropathy: Secondary | ICD-10-CM | POA: Diagnosis not present

## 2018-08-14 DIAGNOSIS — I5043 Acute on chronic combined systolic (congestive) and diastolic (congestive) heart failure: Secondary | ICD-10-CM | POA: Diagnosis not present

## 2018-08-14 DIAGNOSIS — G4730 Sleep apnea, unspecified: Secondary | ICD-10-CM | POA: Diagnosis not present

## 2018-08-14 DIAGNOSIS — M545 Low back pain: Secondary | ICD-10-CM | POA: Diagnosis not present

## 2018-08-14 DIAGNOSIS — N3281 Overactive bladder: Secondary | ICD-10-CM | POA: Diagnosis not present

## 2018-08-22 ENCOUNTER — Other Ambulatory Visit: Payer: Self-pay

## 2018-08-22 DIAGNOSIS — R609 Edema, unspecified: Secondary | ICD-10-CM

## 2018-08-22 DIAGNOSIS — I5032 Chronic diastolic (congestive) heart failure: Secondary | ICD-10-CM

## 2018-08-22 DIAGNOSIS — I509 Heart failure, unspecified: Secondary | ICD-10-CM

## 2018-08-22 MED ORDER — TORSEMIDE 20 MG PO TABS: ORAL_TABLET | ORAL | 1 refills | Status: DC

## 2018-08-26 DIAGNOSIS — G8929 Other chronic pain: Secondary | ICD-10-CM | POA: Diagnosis not present

## 2018-08-26 DIAGNOSIS — E1151 Type 2 diabetes mellitus with diabetic peripheral angiopathy without gangrene: Secondary | ICD-10-CM | POA: Diagnosis not present

## 2018-08-26 DIAGNOSIS — E1143 Type 2 diabetes mellitus with diabetic autonomic (poly)neuropathy: Secondary | ICD-10-CM | POA: Diagnosis not present

## 2018-08-26 DIAGNOSIS — E1142 Type 2 diabetes mellitus with diabetic polyneuropathy: Secondary | ICD-10-CM | POA: Diagnosis not present

## 2018-08-26 DIAGNOSIS — G4730 Sleep apnea, unspecified: Secondary | ICD-10-CM | POA: Diagnosis not present

## 2018-08-26 DIAGNOSIS — I872 Venous insufficiency (chronic) (peripheral): Secondary | ICD-10-CM | POA: Diagnosis not present

## 2018-08-26 DIAGNOSIS — N3281 Overactive bladder: Secondary | ICD-10-CM | POA: Diagnosis not present

## 2018-08-26 DIAGNOSIS — M545 Low back pain: Secondary | ICD-10-CM | POA: Diagnosis not present

## 2018-08-26 DIAGNOSIS — I5043 Acute on chronic combined systolic (congestive) and diastolic (congestive) heart failure: Secondary | ICD-10-CM | POA: Diagnosis not present

## 2018-08-26 DIAGNOSIS — R69 Illness, unspecified: Secondary | ICD-10-CM | POA: Diagnosis not present

## 2018-08-27 DIAGNOSIS — R69 Illness, unspecified: Secondary | ICD-10-CM | POA: Diagnosis not present

## 2018-09-02 DIAGNOSIS — M6281 Muscle weakness (generalized): Secondary | ICD-10-CM | POA: Diagnosis not present

## 2018-09-02 DIAGNOSIS — R69 Illness, unspecified: Secondary | ICD-10-CM | POA: Diagnosis not present

## 2018-09-04 DIAGNOSIS — M6281 Muscle weakness (generalized): Secondary | ICD-10-CM | POA: Diagnosis not present

## 2018-09-04 DIAGNOSIS — R69 Illness, unspecified: Secondary | ICD-10-CM | POA: Diagnosis not present

## 2018-09-07 DIAGNOSIS — I5020 Unspecified systolic (congestive) heart failure: Secondary | ICD-10-CM | POA: Diagnosis not present

## 2018-09-07 DIAGNOSIS — S82402B Unspecified fracture of shaft of left fibula, initial encounter for open fracture type I or II: Secondary | ICD-10-CM | POA: Diagnosis not present

## 2018-09-08 ENCOUNTER — Emergency Department (HOSPITAL_COMMUNITY): Payer: Medicare HMO

## 2018-09-08 ENCOUNTER — Other Ambulatory Visit: Payer: Self-pay | Admitting: Family

## 2018-09-08 ENCOUNTER — Emergency Department (HOSPITAL_COMMUNITY)
Admission: EM | Admit: 2018-09-08 | Discharge: 2018-09-08 | Disposition: A | Discharge status: Home / Self Care | Payer: Medicare HMO | Attending: Emergency Medicine | Admitting: Emergency Medicine

## 2018-09-08 ENCOUNTER — Other Ambulatory Visit: Payer: Self-pay

## 2018-09-08 ENCOUNTER — Encounter (HOSPITAL_COMMUNITY): Payer: Self-pay | Admitting: Emergency Medicine

## 2018-09-08 DIAGNOSIS — I110 Hypertensive heart disease with heart failure: Secondary | ICD-10-CM | POA: Diagnosis not present

## 2018-09-08 DIAGNOSIS — Z7984 Long term (current) use of oral hypoglycemic drugs: Secondary | ICD-10-CM | POA: Diagnosis not present

## 2018-09-08 DIAGNOSIS — R05 Cough: Secondary | ICD-10-CM | POA: Diagnosis not present

## 2018-09-08 DIAGNOSIS — J449 Chronic obstructive pulmonary disease, unspecified: Secondary | ICD-10-CM | POA: Diagnosis not present

## 2018-09-08 DIAGNOSIS — E1140 Type 2 diabetes mellitus with diabetic neuropathy, unspecified: Secondary | ICD-10-CM | POA: Insufficient documentation

## 2018-09-08 DIAGNOSIS — F0390 Unspecified dementia without behavioral disturbance: Secondary | ICD-10-CM | POA: Insufficient documentation

## 2018-09-08 DIAGNOSIS — R4182 Altered mental status, unspecified: Secondary | ICD-10-CM | POA: Diagnosis present

## 2018-09-08 DIAGNOSIS — Z96653 Presence of artificial knee joint, bilateral: Secondary | ICD-10-CM | POA: Insufficient documentation

## 2018-09-08 DIAGNOSIS — M159 Polyosteoarthritis, unspecified: Secondary | ICD-10-CM

## 2018-09-08 DIAGNOSIS — Z7982 Long term (current) use of aspirin: Secondary | ICD-10-CM | POA: Diagnosis not present

## 2018-09-08 DIAGNOSIS — R0689 Other abnormalities of breathing: Secondary | ICD-10-CM | POA: Diagnosis not present

## 2018-09-08 DIAGNOSIS — E039 Hypothyroidism, unspecified: Secondary | ICD-10-CM | POA: Diagnosis not present

## 2018-09-08 DIAGNOSIS — I5042 Chronic combined systolic (congestive) and diastolic (congestive) heart failure: Secondary | ICD-10-CM | POA: Insufficient documentation

## 2018-09-08 DIAGNOSIS — Z950 Presence of cardiac pacemaker: Secondary | ICD-10-CM | POA: Insufficient documentation

## 2018-09-08 DIAGNOSIS — Z79899 Other long term (current) drug therapy: Secondary | ICD-10-CM | POA: Insufficient documentation

## 2018-09-08 DIAGNOSIS — R0989 Other specified symptoms and signs involving the circulatory and respiratory systems: Secondary | ICD-10-CM | POA: Diagnosis not present

## 2018-09-08 DIAGNOSIS — R69 Illness, unspecified: Secondary | ICD-10-CM | POA: Diagnosis not present

## 2018-09-08 DIAGNOSIS — R404 Transient alteration of awareness: Secondary | ICD-10-CM | POA: Diagnosis not present

## 2018-09-08 LAB — LACTIC ACID, PLASMA
LACTIC ACID, VENOUS: 6 mmol/L — AB (ref 0.5–1.9)
Lactic Acid, Venous: 4.9 mmol/L (ref 0.5–1.9)

## 2018-09-08 LAB — BLOOD GAS, VENOUS
Acid-Base Excess: 1.8 mmol/L (ref 0.0–2.0)
Bicarbonate: 24.7 mmol/L (ref 20.0–28.0)
FIO2: 21
O2 Saturation: 53.4 %
Patient temperature: 36.4
pCO2, Ven: 45.9 mmHg (ref 44.0–60.0)
pH, Ven: 7.379 (ref 7.250–7.430)
pO2, Ven: 34.1 mmHg (ref 32.0–45.0)

## 2018-09-08 LAB — CBC WITH DIFFERENTIAL/PLATELET
ABS IMMATURE GRANULOCYTES: 0.04 10*3/uL (ref 0.00–0.07)
Basophils Absolute: 0 10*3/uL (ref 0.0–0.1)
Basophils Relative: 0 %
Eosinophils Absolute: 0 10*3/uL (ref 0.0–0.5)
Eosinophils Relative: 0 %
HCT: 32 % — ABNORMAL LOW (ref 36.0–46.0)
Hemoglobin: 9.7 g/dL — ABNORMAL LOW (ref 12.0–15.0)
Immature Granulocytes: 1 %
Lymphocytes Relative: 11 %
Lymphs Abs: 1 10*3/uL (ref 0.7–4.0)
MCH: 27.2 pg (ref 26.0–34.0)
MCHC: 30.3 g/dL (ref 30.0–36.0)
MCV: 89.9 fL (ref 80.0–100.0)
Monocytes Absolute: 0.6 10*3/uL (ref 0.1–1.0)
Monocytes Relative: 8 %
NEUTROS ABS: 6.8 10*3/uL (ref 1.7–7.7)
Neutrophils Relative %: 80 %
Platelets: 198 10*3/uL (ref 150–400)
RBC: 3.56 MIL/uL — ABNORMAL LOW (ref 3.87–5.11)
RDW: 15 % (ref 11.5–15.5)
WBC: 8.5 10*3/uL (ref 4.0–10.5)
nRBC: 0 % (ref 0.0–0.2)

## 2018-09-08 LAB — BASIC METABOLIC PANEL
Anion gap: 15 (ref 5–15)
BUN: 60 mg/dL — ABNORMAL HIGH (ref 8–23)
CHLORIDE: 94 mmol/L — AB (ref 98–111)
CO2: 23 mmol/L (ref 22–32)
Calcium: 8.8 mg/dL — ABNORMAL LOW (ref 8.9–10.3)
Creatinine, Ser: 1.27 mg/dL — ABNORMAL HIGH (ref 0.44–1.00)
GFR calc Af Amer: 45 mL/min — ABNORMAL LOW (ref >60)
GFR calc non Af Amer: 39 mL/min — ABNORMAL LOW (ref >60)
Glucose, Bld: 169 mg/dL — ABNORMAL HIGH (ref 70–99)
Potassium: 3.4 mmol/L — ABNORMAL LOW (ref 3.5–5.1)
Sodium: 132 mmol/L — ABNORMAL LOW (ref 135–145)

## 2018-09-08 LAB — URINALYSIS, ROUTINE W REFLEX MICROSCOPIC
Bilirubin Urine: NEGATIVE
Glucose, UA: NEGATIVE mg/dL
Hgb urine dipstick: NEGATIVE
Ketones, ur: NEGATIVE mg/dL
Leukocytes,Ua: NEGATIVE
Nitrite: NEGATIVE
Protein, ur: NEGATIVE mg/dL
Specific Gravity, Urine: 1.009 (ref 1.005–1.030)
pH: 6 (ref 5.0–8.0)

## 2018-09-08 MED ORDER — SODIUM CHLORIDE 0.9 % IV BOLUS
500.0000 mL | Freq: Once | INTRAVENOUS | Status: AC
  Administered 2018-09-08: 500 mL via INTRAVENOUS

## 2018-09-08 MED ORDER — SODIUM CHLORIDE 0.9 % IV BOLUS
2000.0000 mL | Freq: Once | INTRAVENOUS | Status: AC
  Administered 2018-09-08: 2000 mL via INTRAVENOUS

## 2018-09-08 NOTE — ED Triage Notes (Signed)
Pt sent from [facility] for ams they said that she would not respond to the staff. She has just been dx with dementia

## 2018-09-08 NOTE — Discharge Instructions (Addendum)
Take all of your medicines as directed.  You may be a little dehydrated.  Try to drink 3-4 more glasses of water each day.  See your doctor for checkup in 1 week.

## 2018-09-08 NOTE — ED Notes (Signed)
Date and time results received: 09/08/18 1153 (use smartphrase ".now" to insert current time)  Test: lac acid Critical Value: 4.9  Name of Provider Notified: wentz   Orders Received? Or Actions Taken?: see chart

## 2018-09-08 NOTE — ED Provider Notes (Signed)
[facility] EMERGENCY DEPARTMENT Provider Note   CSN: [id number] Arrival date & time: 09/08/18  4503    History   Chief Complaint Chief Complaint  Patient presents with  . Altered Mental Status    HPI [name] [name] is a 85 y.o. female.     HPI   She was reportedly sent here by her caregivers at her nursing care facility because "she would not respond."  The patient was apparently recently diagnosed with dementia.  She is unable to give much history.  The patient denies pain.  She is unsure where she lives.  Level 5 caveat-dementia  Past Medical History:  Diagnosis Date  . ANXIETY   . Arteriosclerotic cardiovascular disease (ASCVD)    Cath in 9/99-70% mid LAD with diffuse distal disease, 70% T1, 60% mid circumflex, 50% mid RCA, normal ejection fraction; negative stress nuclear in 07/2008  . Carpal tunnel syndrome   . Cerebrovascular disease    carotid bruits; no focal disease in 1999, 2002 and 2006  . Closed left fibular fracture 11/30/11  . COPD   . Dementia (HCC)   . Diabetes mellitus    Insulin treatment  . Diastolic CHF, chronic (HCC)    Normal EF  . Gastroesophageal reflux disease    With hiatal hernia; irritable bowel syndrome; H. pylori-treated; Colonoscopy 2008: non-specific colitis, IH, Diverticulosis, Rectal ulcer secondary to ASA  . HOH (hard of hearing)   . Hyperlipidemia   . Hypertension   . Hypothyroidism   . OSTEOARTHRITIS, KNEES, BILATERAL    Bilateral TKA  . Pacemaker   . Peripheral neuropathy   . Sinoatrial node dysfunction (HCC) 2003   Medtronic dual-chamber device  . Sleep apnea    pt could not tolerate  . VITAMIN B12 DEFICIENCY     Patient Active Problem List   Diagnosis Date Noted  . Palliative care by specialist   . DNR (do not resuscitate) discussion   . CHF exacerbation (HCC) 02/27/2018  . Fall at home, initial encounter 02/23/2018  . Fall 02/23/2018  . Aortic atherosclerosis (HCC) 10/15/2017  . Chronic combined systolic  (congestive) and diastolic (congestive) heart failure (HCC) 07/02/2017  . Pleural effusion   . Palliative care encounter   . Goals of care, counseling/discussion   . Pressure injury of skin 06/29/2017  . Elevated troponin 06/28/2017  . Normocytic anemia 06/28/2017  . Sleep apnea 06/28/2017  . Tinea cruris 06/28/2017  . Hyperlipidemia associated with type 2 diabetes mellitus (HCC) 05/14/2017  . Chronic back pain 01/28/2017  . PVD (peripheral vascular disease) (HCC) 01/27/2016  . Skin moniliasis 05/31/2014  . OAB (overactive bladder) 12/23/2012  . Insomnia 04/06/2011  . Sinoatrial node dysfunction (HCC)   . Gastroesophageal reflux disease   . Type II diabetes mellitus with peripheral autonomic neuropathy (HCC)   . GAD (generalized anxiety disorder) 12/30/2009  . Diabetic neuropathy (HCC) 12/26/2009  . Vitamin B 12 deficiency 10/11/2009  . Arteriosclerotic cardiovascular disease (ASCVD) 09/15/2009  . CEREBROVASCULAR DISEASE 09/15/2009  . Hypertension associated with diabetes (HCC) 08/29/2009  . Osteoarthrosis involving lower leg 08/29/2009  . COLONIC POLYPS, ADENOMATOUS, HX OF 02/22/2009  . Hypothyroid 02/17/2009  . Chronic obstructive pulmonary disease (HCC) 02/17/2009  . Chronic diastolic heart failure (HCC) 12/15/2008  . PPM-Medtronic 12/15/2008    Past Surgical History:  Procedure Laterality Date  . ABDOMINAL HYSTERECTOMY    . BLADDER REPAIR    . CARDIAC PACEMAKER PLACEMENT  2003   Medtronic, dual-chamber  . CARPAL TUNNEL RELEASE    .  CATARACT EXTRACTION W/PHACO Left 08/09/2017   Procedure: CATARACT EXTRACTION PHACO AND INTRAOCULAR LENS PLACEMENT (IOC);  Surgeon: Fabio Pierce, MD;  Location: [facility];  Service: Ophthalmology;  Laterality: Left;  CDE: 6.26  . CHOLECYSTECTOMY    . COLONOSCOPY  2008  . HEMORRHOID SURGERY    . NASAL FRACTURE SURGERY    . THYROIDECTOMY  1980   Goiter  . TOTAL KNEE ARTHROPLASTY     Bilateral     OB History   No obstetric history on file.       Home Medications    Prior to Admission medications   Medication Sig Start Date End Date Taking? Authorizing Provider  acetaminophen (TYLENOL) 325 MG tablet Take 2 tablets (650 mg total) by mouth every 6 (six) hours as needed for mild pain, moderate pain, fever or headache. 07/21/18  Yes Samuel Jester, DO  albuterol (PROVENTIL HFA;VENTOLIN HFA) 108 (90 Base) MCG/ACT inhaler Inhale 2 puffs into the lungs every 6 (six) hours as needed for wheezing or shortness of breath. 07/21/18  Yes Samuel Jester, DO  ALPRAZolam Prudy Feeler) 0.5 MG tablet Take 1 tablet (0.5 mg total) by mouth at bedtime as needed for anxiety. 07/21/18  Yes Samuel Jester, DO  aspirin EC 81 MG tablet Take 81 mg by mouth daily. Reported on 12/14/2015   Yes [provider]  carvedilol (COREG) 12.5 MG tablet Take 1 tablet (12.5 mg total) by mouth 2 (two) times daily with a meal. 07/21/18  Yes Samuel Jester, DO  fluticasone furoate-vilanterol (BREO ELLIPTA) 100-25 MCG/INH AEPB Inhale 1 puff into the lungs every morning. 07/21/18  Yes Samuel Jester, DO  gabapentin (NEURONTIN) 300 MG capsule Take 1 capsule (300 mg total) by mouth 3 (three) times daily for 15 days. 07/21/18 09/08/18 Yes Samuel Jester, DO  levothyroxine (SYNTHROID, LEVOTHROID) 200 MCG tablet TAKE (1) TABLET DAILY BE- FORE BREAKFAST. Patient taking differently: Take 200 mcg by mouth daily before breakfast. TAKE (1) TABLET DAILY BE- FORE BREAKFAST. 05/13/18  Yes Hawks, Christy A, FNP  lisinopril (PRINIVIL,ZESTRIL) 20 MG tablet Take 1 tablet (20 mg total) by mouth daily. 07/21/18  Yes Samuel Jester, DO  loperamide (IMODIUM A-D) 2 MG tablet Take 1 tablet (2 mg total) by mouth 4 (four) times daily as needed for diarrhea or loose stools. 08/11/18  Yes Hawks, Christy A, FNP  metFORMIN (GLUCOPHAGE) 1000 MG tablet Take 1 tablet (1,000 mg total) by mouth 2 (two) times daily. 07/21/18  Yes Samuel Jester, DO  nitroGLYCERIN (NITROLINGUAL) 0.4 MG/SPRAY spray  Place 1 spray under the tongue every 5 (five) minutes x 3 doses as needed for chest pain. 07/21/18  Yes Samuel Jester, DO  nitroGLYCERIN (NITROSTAT) 0.3 MG SL tablet Place 0.3 mg under the tongue every 5 (five) minutes as needed for chest pain.   Yes [provider]  nystatin-triamcinolone (MYCOLOG II) cream Apply 1 application topically 3 (three) times daily.   Yes [provider]  omeprazole (PRILOSEC) 20 MG capsule Take 1 capsule (20 mg total) by mouth daily. 07/21/18  Yes Samuel Jester, DO  pravastatin (PRAVACHOL) 80 MG tablet Take 1 tablet (80 mg total) by mouth daily. 07/21/18  Yes Samuel Jester, DO  sertraline (ZOLOFT) 50 MG tablet Take 1 tablet (50 mg total) by mouth daily. 07/21/18  Yes Samuel Jester, DO  tamsulosin (FLOMAX) 0.4 MG CAPS capsule Take 1 capsule (0.4 mg total) by mouth daily after supper. 02/25/18  Yes Emokpae, Courage, MD  torsemide (DEMADEX) 20 MG tablet 40 mg in AM, 40 mg afternoon,  20 mg early evening Patient taking differently: Take 20-40 mg by mouth See admin instructions. Take 40 mg daily at 8 am and 2 pm, then take 20 mg daily at 8 pm. 08/22/18  Yes Hawks, Neysa Bonito A, FNP  traMADol (ULTRAM) 50 MG tablet Take 1-2 tablets (50-100 mg total) by mouth every 12 (twelve) hours as needed. Patient taking differently: Take 50 mg by mouth every 12 (twelve) hours as needed for severe pain.  06/25/18  Yes Junie Spencer, FNP  ACCU-CHEK SOFTCLIX LANCETS lancets Test sugars twice daily Patient not taking: Reported on 09/08/2018 07/22/18   Junie Spencer, FNP    Family History Family History  Problem Relation Age of Onset  . COPD Father   . Heart attack Father 45  . Heart failure Mother   . Cancer Sister        colon  . Cancer Brother        lung  . Cancer Sister        bladder  . Cancer Brother        lung  . Early death Son   . Diabetes Son   . Heart disease Son   . Cancer Son        prostate  . Cancer Sister        kidney cancer  .  Diabetes Sister   . Cancer Daughter     Social History Social History   Tobacco Use  . Smoking status: Never Smoker  . Smokeless tobacco: Never Used  Substance Use Topics  . Alcohol use: No    Alcohol/week: 0.0 standard drinks  . Drug use: No     Allergies   Penicillins   Review of Systems Review of Systems  All other systems reviewed and are negative.    Physical Exam Updated Vital Signs BP (!) 188/57   Pulse 61   Temp 99.2 F (37.3 C) (Rectal)   Resp (!) 27   Ht 5\' 10"  (1.778 m)   Wt 78 kg   SpO2 93%   BMI 24.67 kg/m   Physical Exam Vitals signs and nursing note reviewed.  Constitutional:      General: She is not in acute distress.    Appearance: She is well-developed. She is not ill-appearing, toxic-appearing or diaphoretic.     Comments: Elderly, frail  HENT:     Head: Normocephalic and atraumatic.     Right Ear: External ear normal.     Left Ear: External ear normal.     Nose: Nose normal.     Mouth/Throat:     Mouth: Mucous membranes are dry.     Pharynx: No oropharyngeal exudate or posterior oropharyngeal erythema.  Eyes:     Conjunctiva/sclera: Conjunctivae normal.     Pupils: Pupils are equal, round, and reactive to light.  Neck:     Musculoskeletal: Normal range of motion and neck supple.     Trachea: Phonation normal.  Cardiovascular:     Rate and Rhythm: Normal rate and regular rhythm.     Heart sounds: Normal heart sounds.  Pulmonary:     Effort: Pulmonary effort is normal.     Breath sounds: Normal breath sounds.  Abdominal:     Palpations: Abdomen is soft.     Tenderness: There is no abdominal tenderness.  Musculoskeletal: Normal range of motion.        General: No tenderness or signs of injury.     Comments: Trace edema lower legs, bilaterally  Skin:  General: Skin is warm and dry.  Neurological:     Mental Status: She is alert.     Cranial Nerves: No cranial nerve deficit.     Sensory: No sensory deficit.     Motor: No  abnormal muscle tone.     Coordination: Coordination normal.     Comments: Alert and responsive.  Answers questions, is confused.  No dysarthria or aphasia.  Psychiatric:        Mood and Affect: Mood normal.        Behavior: Behavior normal.      ED Treatments / Results  Labs (all labs ordered are listed, but only abnormal results are displayed) Labs Reviewed  BASIC METABOLIC PANEL - Abnormal; Notable for the following components:      Result Value   Sodium 132 (*)    Potassium 3.4 (*)    Chloride 94 (*)    Glucose, Bld 169 (*)    BUN 60 (*)    Creatinine, Ser 1.27 (*)    Calcium 8.8 (*)    GFR calc non Af Amer 39 (*)    GFR calc Af Amer 45 (*)    All other components within normal limits  CBC WITH DIFFERENTIAL/PLATELET - Abnormal; Notable for the following components:   RBC 3.56 (*)    Hemoglobin 9.7 (*)    HCT 32.0 (*)    All other components within normal limits  LACTIC ACID, PLASMA - Abnormal; Notable for the following components:   Lactic Acid, Venous 4.9 (*)    All other components within normal limits  LACTIC ACID, PLASMA - Abnormal; Notable for the following components:   Lactic Acid, Venous 6.0 (*)    All other components within normal limits  BLOOD GAS, VENOUS  URINALYSIS, ROUTINE W REFLEX MICROSCOPIC    EKG None  Radiology Dg Chest Port 1 View  Result Date: 09/08/2018 CLINICAL DATA:  85 year old female with productive cough and chest congestion for several days EXAM: PORTABLE CHEST 1 VIEW COMPARISON:  06/27/2018, 06/01/2018 FINDINGS: Cardiomediastinal silhouette unchanged in size and contour. Unchanged position of cardiac pacing device with 2 leads. Calcifications of the aortic arch. No evidence of central vascular congestion or interlobular septal thickening. Similar appearance of coarsened interstitial markings of the bilateral lungs with no new confluent airspace disease. No pleural effusion or pneumothorax. Degenerative changes of the left shoulder  IMPRESSION: Chronic lung changes without evidence of acute cardiopulmonary disease. Unchanged cardiac pacing device Electronically Signed   By: Gilmer Mor D.O.   On: 09/08/2018 11:12    Procedures Procedures (including critical care time)  Medications Ordered in ED Medications  sodium chloride 0.9 % bolus 500 mL (0 mLs Intravenous Stopped 09/08/18 1545)  sodium chloride 0.9 % bolus 2,000 mL (2,000 mLs Intravenous New Bag/Given 09/08/18 1313)     Initial Impression / Assessment and Plan / ED Course  I have reviewed the triage vital signs and the nursing notes.  Pertinent labs & imaging results that were available during my care of the patient were reviewed by me and considered in my medical decision making (see chart for details).  Clinical Course as of Sep 07 1616  Mon Sep 08, 2018  1410 Abnormal, rising  Lactic acid, plasma(!!) [EW]  1410 Abnormal-decreased sodium, decreased potassium, decreased chloride, increased glucose, increased BUN, increased creatinine, low calcium, low GFR  Basic metabolic panel(!) [EW]  1410 Normal  Blood gas, venous [EW]  1410 Normal  Urinalysis, Routine w reflex microscopic [EW]  1411 Normal  except hemoglobin low  CBC with Differential(!) [EW]  1605 No infiltrate or CHF, images reviewed by me  [facility] [[facility]  [phone] I attempted to call the patient's daughter at the number listed on the chart.  There was no answer and the mailbox was "full."  Unable to leave a message.   [EW]    Clinical Course User Index [EW] Mancel BaleWentz, Niemah Schwebke, MD        Patient Vitals for the past 24 hrs:  BP Temp Temp src Pulse Resp SpO2 Height Weight  09/08/18 1515 - - - - (!) 27 - - -  09/08/18 1500 (!) 188/57 - - - (!) 25 - - -  09/08/18 1445 - - - - 14 - - -  09/08/18 1430 (!) 184/57 - - - (!) 26 - - -  09/08/18 1415 - - - - 17 - - -  09/08/18 1400 (!) 174/54 - - - 18 - - -  09/08/18 1345 - - - - (!) 22 - - -  09/08/18 1330 (!) 164/77 - - - (!) 25 - - -   09/08/18 1313 - 99.2 F (37.3 C) Rectal - - - - -  09/08/18 1300 (!) 158/58 - - - (!) 21 - - -  09/08/18 1245 - - - - (!) 23 - - -  09/08/18 1230 (!) 150/52 - - - (!) 22 - - -  09/08/18 1215 - - - - (!) 24 - - -  09/08/18 1200 (!) 158/52 - - - (!) 22 - - -  09/08/18 1145 - - - - (!) 21 - - -  09/08/18 1130 (!) 134/48 - - - (!) 25 - - -  09/08/18 1115 - - - - 16 - - -  09/08/18 1100 (!) 137/46 - - - (!) 23 - - -  09/08/18 1045 - - - - (!) 22 - - -  09/08/18 1030 (!) 131/47 - - 61 (!) 24 93 % - -  09/08/18 1006 (!) 144/46 97.6 F (36.4 C) Oral 63 (!) 22 99 % - -  09/08/18 1002 - - - - - - 5\' 10"  (1.778 m) 78 kg    4:06 PM Reevaluation with update and discussion. After initial assessment and treatment, an updated evaluation reveals no overall change in clinical status.Mancel Bale. Tyreese Thain   Medical Decision Making: Patient was sent here for unresponsiveness.  She has been responsive the entire time.  Appears to have dementia.  Screening testing is reassuring.  She does have some metabolic abnormalities which are mild, and stable, compared with prior testing done 4 weeks ago.  There is no indication for hospitalization at this time.  Patient may benefit from increased oral fluid intake, and close observation by her PCP, at her nursing care facility.  CRITICAL CARE-no Performed by: Mancel BaleElliott Kaymon Denomme  Nursing Notes Reviewed/ Care Coordinated Applicable Imaging Reviewed Interpretation of Laboratory Data incorporated into ED treatment  The patient appears reasonably screened and/or stabilized for discharge and I doubt any other medical condition or other [facility] requiring further screening, evaluation, or treatment in the ED at this time prior to discharge.  Plan: Home Medications-continue usual; Home Treatments-increase oral fluids by 1/2 to 1 L each day; return here if the recommended treatment, does not improve the symptoms; Recommended follow up-PCP follow-up 1 week for checkup and repeat blood  testing.   Final Clinical Impressions(s) / ED Diagnoses   Final diagnoses:  Dementia without behavioral disturbance, unspecified  dementia type [facility])    ED Discharge Orders    None       Mancel Bale, MD 09/08/18 [phone]

## 2018-09-08 NOTE — ED Notes (Signed)
Date and time results received: 09/08/18 1:42 PM   Test: 6 Critical Value: lactic  Name of Provider Notified: Effie Shy   Orders Received? Or Actions Taken?:

## 2018-09-10 DIAGNOSIS — R69 Illness, unspecified: Secondary | ICD-10-CM | POA: Diagnosis not present

## 2018-09-10 DIAGNOSIS — M6281 Muscle weakness (generalized): Secondary | ICD-10-CM | POA: Diagnosis not present

## 2018-09-11 ENCOUNTER — Other Ambulatory Visit: Payer: Self-pay | Admitting: Family

## 2018-09-11 DIAGNOSIS — R609 Edema, unspecified: Secondary | ICD-10-CM

## 2018-09-11 DIAGNOSIS — R69 Illness, unspecified: Secondary | ICD-10-CM | POA: Diagnosis not present

## 2018-09-11 DIAGNOSIS — I509 Heart failure, unspecified: Secondary | ICD-10-CM

## 2018-09-11 DIAGNOSIS — I5032 Chronic diastolic (congestive) heart failure: Secondary | ICD-10-CM

## 2018-09-11 DIAGNOSIS — M6281 Muscle weakness (generalized): Secondary | ICD-10-CM | POA: Diagnosis not present

## 2018-09-12 ENCOUNTER — Encounter: Payer: Self-pay | Admitting: Family

## 2018-09-12 ENCOUNTER — Telehealth: Payer: Self-pay | Admitting: Family

## 2018-09-12 ENCOUNTER — Ambulatory Visit (INDEPENDENT_AMBULATORY_CARE_PROVIDER_SITE_OTHER): Payer: Medicare HMO | Admitting: Family

## 2018-09-12 ENCOUNTER — Other Ambulatory Visit: Payer: Self-pay

## 2018-09-12 DIAGNOSIS — R69 Illness, unspecified: Secondary | ICD-10-CM | POA: Diagnosis not present

## 2018-09-12 DIAGNOSIS — I509 Heart failure, unspecified: Secondary | ICD-10-CM

## 2018-09-12 DIAGNOSIS — Z09 Encounter for follow-up examination after completed treatment for conditions other than malignant neoplasm: Secondary | ICD-10-CM

## 2018-09-12 DIAGNOSIS — I5032 Chronic diastolic (congestive) heart failure: Secondary | ICD-10-CM

## 2018-09-12 DIAGNOSIS — R609 Edema, unspecified: Secondary | ICD-10-CM | POA: Diagnosis not present

## 2018-09-12 DIAGNOSIS — Z8659 Personal history of other mental and behavioral disorders: Secondary | ICD-10-CM

## 2018-09-12 MED ORDER — TORSEMIDE 20 MG PO TABS: ORAL_TABLET | ORAL | 0 refills | Status: AC

## 2018-09-12 NOTE — Telephone Encounter (Signed)
Will refax.

## 2018-09-12 NOTE — Progress Notes (Signed)
Virtual Visit via telephone Note  I connected with [name] [name] caretakeer Tresa Moore  on 09/12/18 at 11:53 AM by telephone and verified that I am speaking with the correct person using two identifiers. [name] [name] is currently located at [facility] and no one is currently with her during visit. The provider, Jannifer Rodney, FNP is located in their office at time of visit.  I discussed the limitations, risks, security and privacy concerns of performing an evaluation and management service by telephone and the availability of in person appointments. I also discussed with the patient that there may be a patient responsible charge related to this service. The patient expressed understanding and agreed to proceed.   History and Present Illness:    HPI Pt called today for hospital follow up. She is a resident at [facility] She was taken to the ED on 09/08/18 with mental status change. She had a negative urine,  Stable WBC.   Nurse manager states on the 09/08/18 pt was not "acting right" and would not response to them or eat or drink. Since being discharge patient has returned to her baseline and is up walking, eating normally, going to the restroom by herself, asking questions, and talking on phone.   They have increased her fluid intake.  She is currently taking her Demadex 40 mg at 8 am, 40 mg 2pm, and 20 mg at 8pm for CHF. Denies any SOB or swelling at this time.     Review of Systems  Constitutional: Positive for fatigue.  Respiratory: Negative for cough and shortness of breath.   Musculoskeletal: Positive for arthralgias.  All other systems reviewed and are negative.       Assessment and Plan: 1. Chronic diastolic heart failure (HCC) - torsemide (DEMADEX) 20 MG tablet; TAKE 2 TABLETS BY MOUTH AT 8am & 2pm  Dispense: 150 tablet; Refill: 0  2. Acute on chronic congestive heart failure, unspecified heart failure type (HCC) - torsemide (DEMADEX) 20 MG tablet; TAKE  2 TABLETS BY MOUTH AT 8am & 2pm  Dispense: 150 tablet; Refill: 0  3. Peripheral edema - torsemide (DEMADEX) 20 MG tablet; TAKE 2 TABLETS BY MOUTH AT 8am & 2pm  Dispense: 150 tablet; Refill: 0  4. Hospital discharge follow-up  5. Mental status change resolved  Will decrease Demadex to 40 mg BID  Continue to weigh daily and report any increase of greater than 3 lbs in one day or 5 pounds in one week Check BMP next week Hospital notes reviewed PT mental status greatly improved Orders faxed to SNF RTO in 3 months      I discussed the assessment and treatment plan with the patient. The patient was provided an opportunity to ask questions and all were answered. The patient agreed with the plan and demonstrated an understanding of the instructions.   The patient was advised to call back or seek an in-person evaluation if the symptoms worsen or if the condition fails to improve as anticipated.  The above assessment and management plan was discussed with the patient. The patient verbalized understanding of and has agreed to the management plan. Patient is aware to call the clinic if symptoms persist or worsen. Patient is aware when to return to the clinic for a follow-up visit. Patient educated on when it is appropriate to go to the emergency department.    Call ended 12:16 pm, I provided 23 minutes of non-face-to-face time during this encounter.    Jannifer Rodney,  FNP  

## 2018-09-15 DIAGNOSIS — M6281 Muscle weakness (generalized): Secondary | ICD-10-CM | POA: Diagnosis not present

## 2018-09-15 DIAGNOSIS — R69 Illness, unspecified: Secondary | ICD-10-CM | POA: Diagnosis not present

## 2018-09-17 DIAGNOSIS — R69 Illness, unspecified: Secondary | ICD-10-CM | POA: Diagnosis not present

## 2018-09-17 DIAGNOSIS — M6281 Muscle weakness (generalized): Secondary | ICD-10-CM | POA: Diagnosis not present

## 2018-09-23 DIAGNOSIS — R69 Illness, unspecified: Secondary | ICD-10-CM | POA: Diagnosis not present

## 2018-09-23 DIAGNOSIS — M6281 Muscle weakness (generalized): Secondary | ICD-10-CM | POA: Diagnosis not present

## 2018-09-25 DIAGNOSIS — R69 Illness, unspecified: Secondary | ICD-10-CM | POA: Diagnosis not present

## 2018-09-25 DIAGNOSIS — M6281 Muscle weakness (generalized): Secondary | ICD-10-CM | POA: Diagnosis not present

## 2018-09-29 ENCOUNTER — Emergency Department (HOSPITAL_COMMUNITY): Payer: Medicare HMO

## 2018-09-29 ENCOUNTER — Emergency Department (HOSPITAL_COMMUNITY)
Admission: EM | Admit: 2018-09-29 | Discharge: 2018-09-29 | Disposition: A | Discharge status: Home / Self Care | Payer: Medicare HMO | Attending: Emergency Medicine | Admitting: Emergency Medicine

## 2018-09-29 ENCOUNTER — Other Ambulatory Visit: Payer: Self-pay

## 2018-09-29 ENCOUNTER — Encounter (HOSPITAL_COMMUNITY): Payer: Self-pay | Admitting: Emergency Medicine

## 2018-09-29 DIAGNOSIS — R69 Illness, unspecified: Secondary | ICD-10-CM | POA: Diagnosis not present

## 2018-09-29 DIAGNOSIS — M25552 Pain in left hip: Secondary | ICD-10-CM | POA: Diagnosis not present

## 2018-09-29 DIAGNOSIS — E119 Type 2 diabetes mellitus without complications: Secondary | ICD-10-CM | POA: Diagnosis not present

## 2018-09-29 DIAGNOSIS — W16212A Fall in (into) filled bathtub causing other injury, initial encounter: Secondary | ICD-10-CM | POA: Insufficient documentation

## 2018-09-29 DIAGNOSIS — J449 Chronic obstructive pulmonary disease, unspecified: Secondary | ICD-10-CM | POA: Diagnosis not present

## 2018-09-29 DIAGNOSIS — Y92002 Bathroom of unspecified non-institutional (private) residence single-family (private) house as the place of occurrence of the external cause: Secondary | ICD-10-CM | POA: Diagnosis not present

## 2018-09-29 DIAGNOSIS — R52 Pain, unspecified: Secondary | ICD-10-CM | POA: Diagnosis not present

## 2018-09-29 DIAGNOSIS — S7012XA Contusion of left thigh, initial encounter: Secondary | ICD-10-CM

## 2018-09-29 DIAGNOSIS — Y998 Other external cause status: Secondary | ICD-10-CM | POA: Diagnosis not present

## 2018-09-29 DIAGNOSIS — S79912A Unspecified injury of left hip, initial encounter: Secondary | ICD-10-CM | POA: Diagnosis not present

## 2018-09-29 DIAGNOSIS — Y93E1 Activity, personal bathing and showering: Secondary | ICD-10-CM | POA: Diagnosis not present

## 2018-09-29 DIAGNOSIS — W19XXXA Unspecified fall, initial encounter: Secondary | ICD-10-CM

## 2018-09-29 DIAGNOSIS — M6281 Muscle weakness (generalized): Secondary | ICD-10-CM | POA: Diagnosis not present

## 2018-09-29 DIAGNOSIS — Z79899 Other long term (current) drug therapy: Secondary | ICD-10-CM | POA: Diagnosis not present

## 2018-09-29 DIAGNOSIS — R5381 Other malaise: Secondary | ICD-10-CM | POA: Diagnosis not present

## 2018-09-29 DIAGNOSIS — I10 Essential (primary) hypertension: Secondary | ICD-10-CM | POA: Insufficient documentation

## 2018-09-29 DIAGNOSIS — S79922A Unspecified injury of left thigh, initial encounter: Secondary | ICD-10-CM | POA: Diagnosis present

## 2018-09-29 MED ORDER — ACETAMINOPHEN 325 MG PO TABS
650.0000 mg | ORAL_TABLET | Freq: Once | ORAL | Status: AC
  Administered 2018-09-29: 650 mg via ORAL
  Filled 2018-09-29: qty 2

## 2018-09-29 MED ORDER — IBUPROFEN 400 MG PO TABS
400.0000 mg | ORAL_TABLET | Freq: Once | ORAL | Status: AC
  Administered 2018-09-29: 400 mg via ORAL
  Filled 2018-09-29: qty 1

## 2018-09-29 NOTE — ED Notes (Signed)
Called [facility] and gave report to Miller. States they will send someone to pick up patient.

## 2018-09-29 NOTE — ED Triage Notes (Signed)
Pt from [facility], tripped and fell this morning. Now c/o of tailbone pain.  "knot" noted to the left of her buttocks

## 2018-09-29 NOTE — ED Notes (Signed)
Pt ambulated well. Pt just kept saying that the bump on her bottom was hurting.

## 2018-09-30 NOTE — ED Provider Notes (Signed)
[facility] EMERGENCY DEPARTMENT Provider Note   CSN: [id number] Arrival date & time: 09/29/18  1525    History   Chief Complaint Chief Complaint  Patient presents with  . Fall    HPI [name] [name] is a 85 y.o. female.     HPI   85yF presenting after a fall. Says she fell in the tub. Lost balance. Denies preceding symptoms. She is having pain in L hip/thigh. Denies acute pain elsewhere. No HA, neck or back pain. No blood thinners.   Past Medical History:  Diagnosis Date  . ANXIETY   . Arteriosclerotic cardiovascular disease (ASCVD)    Cath in 9/99-70% mid LAD with diffuse distal disease, 70% T1, 60% mid circumflex, 50% mid RCA, normal ejection fraction; negative stress nuclear in 07/2008  . Carpal tunnel syndrome   . Cerebrovascular disease    carotid bruits; no focal disease in 1999, 2002 and 2006  . Closed left fibular fracture 11/30/11  . COPD   . Dementia (HCC)   . Diabetes mellitus    Insulin treatment  . Diastolic CHF, chronic (HCC)    Normal EF  . Gastroesophageal reflux disease    With hiatal hernia; irritable bowel syndrome; H. pylori-treated; Colonoscopy 2008: non-specific colitis, IH, Diverticulosis, Rectal ulcer secondary to ASA  . HOH (hard of hearing)   . Hyperlipidemia   . Hypertension   . Hypothyroidism   . OSTEOARTHRITIS, KNEES, BILATERAL    Bilateral TKA  . Pacemaker   . Peripheral neuropathy   . Sinoatrial node dysfunction (HCC) 2003   Medtronic dual-chamber device  . Sleep apnea    pt could not tolerate  . VITAMIN B12 DEFICIENCY     Patient Active Problem List   Diagnosis Date Noted  . Palliative care by specialist   . DNR (do not resuscitate) discussion   . CHF exacerbation (HCC) 02/27/2018  . Fall at home, initial encounter 02/23/2018  . Fall 02/23/2018  . Aortic atherosclerosis (HCC) 10/15/2017  . Chronic combined systolic (congestive) and diastolic (congestive) heart failure (HCC) 07/02/2017  . Pleural effusion   . Palliative  care encounter   . Goals of care, counseling/discussion   . Pressure injury of skin 06/29/2017  . Elevated troponin 06/28/2017  . Normocytic anemia 06/28/2017  . Sleep apnea 06/28/2017  . Tinea cruris 06/28/2017  . Hyperlipidemia associated with type 2 diabetes mellitus (HCC) 05/14/2017  . Chronic back pain 01/28/2017  . PVD (peripheral vascular disease) (HCC) 01/27/2016  . Skin moniliasis 05/31/2014  . OAB (overactive bladder) 12/23/2012  . Insomnia 04/06/2011  . Sinoatrial node dysfunction (HCC)   . Gastroesophageal reflux disease   . Type II diabetes mellitus with peripheral autonomic neuropathy (HCC)   . GAD (generalized anxiety disorder) 12/30/2009  . Diabetic neuropathy (HCC) 12/26/2009  . Vitamin B 12 deficiency 10/11/2009  . Arteriosclerotic cardiovascular disease (ASCVD) 09/15/2009  . CEREBROVASCULAR DISEASE 09/15/2009  . Hypertension associated with diabetes (HCC) 08/29/2009  . Osteoarthrosis involving lower leg 08/29/2009  . COLONIC POLYPS, ADENOMATOUS, HX OF 02/22/2009  . Hypothyroid 02/17/2009  . Chronic obstructive pulmonary disease (HCC) 02/17/2009  . Chronic diastolic heart failure (HCC) 12/15/2008  . PPM-Medtronic 12/15/2008    Past Surgical History:  Procedure Laterality Date  . ABDOMINAL HYSTERECTOMY    . BLADDER REPAIR    . CARDIAC PACEMAKER PLACEMENT  2003   Medtronic, dual-chamber  . CARPAL TUNNEL RELEASE    . CATARACT EXTRACTION W/PHACO Left 08/09/2017   Procedure: CATARACT EXTRACTION PHACO AND INTRAOCULAR LENS PLACEMENT (IOC);  Surgeon: Fabio PierceWrzosek, James, MD;  Location: [facility];  Service: Ophthalmology;  Laterality: Left;  CDE: 6.26  . CHOLECYSTECTOMY    . COLONOSCOPY  2008  . HEMORRHOID SURGERY    . NASAL FRACTURE SURGERY    . THYROIDECTOMY  1980   Goiter  . TOTAL KNEE ARTHROPLASTY     Bilateral     OB History   No obstetric history on file.      Home Medications    Prior to Admission medications   Medication Sig Start Date End Date Taking?  Authorizing Provider  acetaminophen (TYLENOL) 325 MG tablet Take 2 tablets (650 mg total) by mouth every 6 (six) hours as needed for mild pain, moderate pain, fever or headache. 07/21/18  Yes Samuel JesterMcManus, Kathleen, DO  albuterol (PROVENTIL HFA;VENTOLIN HFA) 108 (90 Base) MCG/ACT inhaler Inhale 2 puffs into the lungs every 6 (six) hours as needed for wheezing or shortness of breath. 07/21/18  Yes Samuel JesterMcManus, Kathleen, DO  ALPRAZolam Prudy Feeler(XANAX) 0.5 MG tablet Take 1 tablet (0.5 mg total) by mouth at bedtime as needed for anxiety. 07/21/18  Yes Samuel JesterMcManus, Kathleen, DO  aspirin EC 81 MG tablet Take 81 mg by mouth daily. Reported on 12/14/2015   Yes [provider]  carvedilol (COREG) 12.5 MG tablet Take 1 tablet (12.5 mg total) by mouth 2 (two) times daily with a meal. 07/21/18  Yes Samuel JesterMcManus, Kathleen, DO  fluticasone furoate-vilanterol (BREO ELLIPTA) 100-25 MCG/INH AEPB Inhale 1 puff into the lungs every morning. 07/21/18  Yes Samuel JesterMcManus, Kathleen, DO  gabapentin (NEURONTIN) 300 MG capsule Take 1 capsule (300 mg total) by mouth 3 (three) times daily for 15 days. 07/21/18 09/29/18 Yes Samuel JesterMcManus, Kathleen, DO  levothyroxine (SYNTHROID, LEVOTHROID) 200 MCG tablet TAKE (1) TABLET DAILY BE- FORE BREAKFAST. Patient taking differently: Take 200 mcg by mouth daily before breakfast. TAKE (1) TABLET DAILY BE- FORE BREAKFAST. 05/13/18  Yes Hawks, Christy A, FNP  lisinopril (PRINIVIL,ZESTRIL) 20 MG tablet Take 1 tablet (20 mg total) by mouth daily. 07/21/18  Yes Samuel JesterMcManus, Kathleen, DO  loperamide (IMODIUM A-D) 2 MG tablet Take 1 tablet (2 mg total) by mouth 4 (four) times daily as needed for diarrhea or loose stools. 08/11/18  Yes Hawks, Christy A, FNP  metFORMIN (GLUCOPHAGE) 1000 MG tablet Take 1 tablet (1,000 mg total) by mouth 2 (two) times daily. 07/21/18  Yes Samuel JesterMcManus, Kathleen, DO  nitroGLYCERIN (NITROLINGUAL) 0.4 MG/SPRAY spray Place 1 spray under the tongue every 5 (five) minutes x 3 doses as needed for chest pain. 07/21/18  Yes  Samuel JesterMcManus, Kathleen, DO  nitroGLYCERIN (NITROSTAT) 0.3 MG SL tablet Place 0.3 mg under the tongue every 5 (five) minutes as needed for chest pain.   Yes [provider]  nystatin-triamcinolone (MYCOLOG II) cream Apply 1 application topically 3 (three) times daily.   Yes [provider]  omeprazole (PRILOSEC) 20 MG capsule Take 1 capsule (20 mg total) by mouth daily. 07/21/18  Yes Samuel JesterMcManus, Kathleen, DO  pravastatin (PRAVACHOL) 80 MG tablet Take 1 tablet (80 mg total) by mouth daily. 07/21/18  Yes Samuel JesterMcManus, Kathleen, DO  sertraline (ZOLOFT) 50 MG tablet Take 1 tablet (50 mg total) by mouth daily. 07/21/18  Yes Samuel JesterMcManus, Kathleen, DO  tamsulosin (FLOMAX) 0.4 MG CAPS capsule Take 1 capsule (0.4 mg total) by mouth daily after supper. 02/25/18  Yes Emokpae, Courage, MD  torsemide (DEMADEX) 20 MG tablet TAKE 2 TABLETS BY MOUTH AT 8am & 2pm Patient taking differently: Take 40 mg by mouth 2 (two) times daily. TAKE 2 TABLETS  BY MOUTH AT 8am & 2pm 09/12/18  Yes Hawks, Christy A, FNP  traMADol (ULTRAM) 50 MG tablet Take 1-2 tablets (50-100 mg total) by mouth every 12 (twelve) hours as needed. Patient taking differently: Take 50 mg by mouth every 12 (twelve) hours as needed for severe pain.  06/25/18  Yes Hawks, Edilia Bo, FNP    Family History Family History  Problem Relation Age of Onset  . COPD Father   . Heart attack Father 22  . Heart failure Mother   . Cancer Sister        colon  . Cancer Brother        lung  . Cancer Sister        bladder  . Cancer Brother        lung  . Early death Son   . Diabetes Son   . Heart disease Son   . Cancer Son        prostate  . Cancer Sister        kidney cancer  . Diabetes Sister   . Cancer Daughter     Social History Social History   Tobacco Use  . Smoking status: Never Smoker  . Smokeless tobacco: Never Used  Substance Use Topics  . Alcohol use: No    Alcohol/week: 0.0 standard drinks  . Drug use: No     Allergies   Penicillins    Review of Systems Review of Systems  All systems reviewed and negative, other than as noted in HPI.  Physical Exam Updated Vital Signs BP (!) 196/65   Pulse 61   Resp 20   Ht  (1.778 m)   Wt 77.6 kg   SpO2 96%   BMI 24.54 kg/m   Physical Exam Vitals signs and nursing note reviewed.  Constitutional:      General: She is not in acute distress.    Appearance: She is well-developed.  HENT:     Head: Normocephalic and atraumatic.  Eyes:     General:        Right eye: No discharge.        Left eye: No discharge.     Conjunctiva/sclera: Conjunctivae normal.  Neck:     Musculoskeletal: Neck supple.  Cardiovascular:     Rate and Rhythm: Normal rate and regular rhythm.     Heart sounds: Normal heart sounds. No murmur. No friction rub. No gallop.   Pulmonary:     Effort: Pulmonary effort is normal. No respiratory distress.     Breath sounds: Normal breath sounds.  Abdominal:     General: There is no distension.     Palpations: Abdomen is soft.     Tenderness: There is no abdominal tenderness.  Musculoskeletal:        General: Tenderness present.       Legs:     Comments: TTP in pictured area. Can actively range hip. No overlying skin changes. NVI. No midline spinal tenderness.   Skin:    General: Skin is warm and dry.  Neurological:     Mental Status: She is alert.  Psychiatric:        Behavior: Behavior normal.        Thought Content: Thought content normal.      ED Treatments / Results  Labs (all labs ordered are listed, but only abnormal results are displayed) Labs Reviewed - No data to display  EKG None  Radiology Dg Hip Unilat W Or Wo Pelvis 2-3 Views Left  Result Date: 09/29/2018 CLINICAL DATA:  Left hip pain after fall. EXAM: DG HIP (WITH OR WITHOUT PELVIS) 2-3V LEFT COMPARISON:  None. FINDINGS: There is no evidence of hip fracture or dislocation. There is no evidence of arthropathy or other focal bone abnormality. IMPRESSION: Negative.  Electronically Signed   By: Lupita Raider M.D.   On: 09/29/2018 16:54    Procedures Procedures (including critical care time)  Medications Ordered in ED Medications  ibuprofen (ADVIL) tablet 400 mg (400 mg Oral Given 09/29/18 1643)  acetaminophen (TYLENOL) tablet 650 mg (650 mg Oral Given 09/29/18 1643)     Initial Impression / Assessment and Plan / ED Course  I have reviewed the triage vital signs and the nursing notes.  Pertinent labs & imaging results that were available during my care of the patient were reviewed by me and considered in my medical decision making (see chart for details).        85yF with likely contusion of thigh. Negative imaging. Can ambulate on it. NVI. Denies preceding symptoms.   Final Clinical Impressions(s) / ED Diagnoses   Final diagnoses:  Fall, initial encounter  Contusion of left thigh, initial encounter    ED Discharge Orders    None       Raeford Razor, MD 09/30/18 1708

## 2018-10-01 DIAGNOSIS — M6281 Muscle weakness (generalized): Secondary | ICD-10-CM | POA: Diagnosis not present

## 2018-10-01 DIAGNOSIS — R69 Illness, unspecified: Secondary | ICD-10-CM | POA: Diagnosis not present

## 2018-10-02 ENCOUNTER — Telehealth: Payer: Self-pay | Admitting: *Deleted

## 2018-10-02 NOTE — Telephone Encounter (Signed)
done

## 2018-10-06 DIAGNOSIS — M6281 Muscle weakness (generalized): Secondary | ICD-10-CM | POA: Diagnosis not present

## 2018-10-06 DIAGNOSIS — R69 Illness, unspecified: Secondary | ICD-10-CM | POA: Diagnosis not present

## 2018-10-07 ENCOUNTER — Ambulatory Visit (INDEPENDENT_AMBULATORY_CARE_PROVIDER_SITE_OTHER): Payer: Medicare HMO | Admitting: Family

## 2018-10-07 ENCOUNTER — Other Ambulatory Visit: Payer: Self-pay

## 2018-10-07 ENCOUNTER — Encounter: Payer: Self-pay | Admitting: Family

## 2018-10-07 DIAGNOSIS — Z9181 History of falling: Secondary | ICD-10-CM | POA: Diagnosis not present

## 2018-10-07 DIAGNOSIS — S7011XD Contusion of right thigh, subsequent encounter: Secondary | ICD-10-CM | POA: Diagnosis not present

## 2018-10-07 DIAGNOSIS — Z09 Encounter for follow-up examination after completed treatment for conditions other than malignant neoplasm: Secondary | ICD-10-CM | POA: Diagnosis not present

## 2018-10-07 NOTE — Progress Notes (Signed)
   Virtual Visit via telephone Note  I connected with [name] [name] on 10/07/18 at 1:27 pm by telephone and verified that I am speaking with the correct person using two identifiers. [name] [name] is currently located at nursing home and caregiver  is currently with her during visit. The provider, Jannifer Rodney, FNP is located in their office at time of visit.  I discussed the limitations, risks, security and privacy concerns of performing an evaluation and management service by telephone and the availability of in person appointments. I also discussed with the patient that there may be a patient responsible charge related to this service. The patient expressed understanding and agreed to proceed.   History and Present Illness:     HPI Pt calls the office today for hospital follow up. She fell on 09/29/18 while in the bathroom and landed on her left buttocks and thigh. She had negative x-rays and was discharged back to the nursing home.   She states she feels fine, but continues to have mild soreness in her left buttocks.    Review of Systems  Observations/Objective: No SOB or distress   Assessment and Plan: [name] [name] comes in today with chief complaint of No chief complaint on file.   Diagnosis and orders addressed:  1. Hospital discharge follow-up   2. At moderate risk for fall   3. Contusion of right thigh, subsequent encounter    Hospital notes reviewed Fall preventions discussed Continue current medications  Call if you needed anything      I discussed the assessment and treatment plan with the patient. The patient was provided an opportunity to ask questions and all were answered. The patient agreed with the plan and demonstrated an understanding of the instructions.   The patient was advised to call back or seek an in-person evaluation if the symptoms worsen or if the condition fails to improve as anticipated.  The above assessment and management plan was  discussed with the patient. The patient verbalized understanding of and has agreed to the management plan. Patient is aware to call the clinic if symptoms persist or worsen. Patient is aware when to return to the clinic for a follow-up visit. Patient educated on when it is appropriate to go to the emergency department.    Call ended 1:43 pm, I provided 16 minutes of non-face-to-face time during this encounter.    Jannifer Rodney, FNP

## 2018-10-08 DIAGNOSIS — I5020 Unspecified systolic (congestive) heart failure: Secondary | ICD-10-CM | POA: Diagnosis not present

## 2018-10-08 DIAGNOSIS — S82402B Unspecified fracture of shaft of left fibula, initial encounter for open fracture type I or II: Secondary | ICD-10-CM | POA: Diagnosis not present

## 2018-10-10 ENCOUNTER — Telehealth: Payer: Self-pay | Admitting: Family

## 2018-10-10 NOTE — Telephone Encounter (Signed)
They did not receive forms that were faxed back on 10/03/18, will resend

## 2018-10-13 DIAGNOSIS — R69 Illness, unspecified: Secondary | ICD-10-CM | POA: Diagnosis not present

## 2018-10-13 DIAGNOSIS — M6281 Muscle weakness (generalized): Secondary | ICD-10-CM | POA: Diagnosis not present

## 2018-10-15 DIAGNOSIS — M6281 Muscle weakness (generalized): Secondary | ICD-10-CM | POA: Diagnosis not present

## 2018-10-15 DIAGNOSIS — R69 Illness, unspecified: Secondary | ICD-10-CM | POA: Diagnosis not present

## 2018-10-16 DIAGNOSIS — R69 Illness, unspecified: Secondary | ICD-10-CM | POA: Diagnosis not present

## 2018-10-20 DIAGNOSIS — R69 Illness, unspecified: Secondary | ICD-10-CM | POA: Diagnosis not present

## 2018-10-20 DIAGNOSIS — M6281 Muscle weakness (generalized): Secondary | ICD-10-CM | POA: Diagnosis not present

## 2018-10-24 DIAGNOSIS — R69 Illness, unspecified: Secondary | ICD-10-CM | POA: Diagnosis not present

## 2018-10-24 DIAGNOSIS — M6281 Muscle weakness (generalized): Secondary | ICD-10-CM | POA: Diagnosis not present

## 2018-10-27 DIAGNOSIS — M6281 Muscle weakness (generalized): Secondary | ICD-10-CM | POA: Diagnosis not present

## 2018-10-27 DIAGNOSIS — R69 Illness, unspecified: Secondary | ICD-10-CM | POA: Diagnosis not present

## 2018-11-07 DIAGNOSIS — S82402B Unspecified fracture of shaft of left fibula, initial encounter for open fracture type I or II: Secondary | ICD-10-CM | POA: Diagnosis not present

## 2018-11-07 DIAGNOSIS — I5020 Unspecified systolic (congestive) heart failure: Secondary | ICD-10-CM | POA: Diagnosis not present

## 2018-11-20 ENCOUNTER — Telehealth: Payer: Self-pay | Admitting: Family

## 2018-11-20 NOTE — Telephone Encounter (Signed)
Medication review with [employer]

## 2018-11-26 ENCOUNTER — Other Ambulatory Visit: Payer: Self-pay

## 2018-11-26 ENCOUNTER — Telehealth (INDEPENDENT_AMBULATORY_CARE_PROVIDER_SITE_OTHER): Payer: Medicare HMO | Admitting: Internal Medicine

## 2018-11-26 VITALS — BP 168/53 | HR 59 | Ht 66.0 in | Wt 160.0 lb

## 2018-11-26 DIAGNOSIS — I10 Essential (primary) hypertension: Secondary | ICD-10-CM

## 2018-11-26 DIAGNOSIS — I495 Sick sinus syndrome: Secondary | ICD-10-CM | POA: Diagnosis not present

## 2018-11-26 NOTE — Progress Notes (Signed)
Electrophysiology TeleHealth Note   Due to national recommendations of social distancing due to COVID 19, an audio/video telehealth visit is felt to be most appropriate for this patient at this time.  See MyChart message from today for the patient's consent to telehealth for [facility].   Date:  11/26/2018   ID:  [name] [name], DOB March 27, 1933, MRN [id number]  Location: patient's home  Provider location: [address] [place]  Evaluation Performed: Follow-up visit  PCP:  Sharion Balloon, FNP  Cardiologist:  No primary care provider on file.  Electrophysiologist:  Dr Lovena Le  Chief Complaint:  "I'm doing ok. "  History of Present Illness:    [name] [name] is a 85 y.o. female who presents via audio/video conferencing for a telehealth visit today.  Since last being seen in our clinic, the patient reports doing very well.  Today, she denies symptoms of palpitations, chest pain, shortness of breath,  lower extremity edema, dizziness, presyncope, or syncope.  The patient is otherwise without complaint today.  The patient denies symptoms of fevers, chills, cough, or new SOB worrisome for COVID 19.  Past Medical History:  Diagnosis Date  . ANXIETY   . Arteriosclerotic cardiovascular disease (ASCVD)    Cath in 9/99-70% mid LAD with diffuse distal disease, 70% T1, 60% mid circumflex, 50% mid RCA, normal ejection fraction; negative stress nuclear in 07/2008  . Carpal tunnel syndrome   . Cerebrovascular disease    carotid bruits; no focal disease in 1999, 2002 and 2006  . Closed left fibular fracture 11/30/11  . COPD   . Dementia ([place])   . Diabetes mellitus    Insulin treatment  . Diastolic CHF, chronic (HCC)    Normal EF  . Gastroesophageal reflux disease    With hiatal hernia; irritable bowel syndrome; H. pylori-treated; Colonoscopy 2008: non-specific colitis, IH, Diverticulosis, Rectal ulcer secondary to ASA  . HOH (hard of hearing)   . Hyperlipidemia   .  Hypertension   . Hypothyroidism   . OSTEOARTHRITIS, KNEES, BILATERAL    Bilateral TKA  . Pacemaker   . Peripheral neuropathy   . Sinoatrial node dysfunction ([place]) 2003   Medtronic dual-chamber device  . Sleep apnea    pt could not tolerate  . VITAMIN B12 DEFICIENCY     Past Surgical History:  Procedure Laterality Date  . ABDOMINAL HYSTERECTOMY    . BLADDER REPAIR    . CARDIAC PACEMAKER PLACEMENT  2003   Medtronic, dual-chamber  . CARPAL TUNNEL RELEASE    . CATARACT EXTRACTION W/PHACO Left 08/09/2017   Procedure: CATARACT EXTRACTION PHACO AND INTRAOCULAR LENS PLACEMENT (IOC);  Surgeon: Baruch Goldmann, MD;  Location: [facility];  Service: Ophthalmology;  Laterality: Left;  CDE: 6.26  . CHOLECYSTECTOMY    . COLONOSCOPY  2008  . HEMORRHOID SURGERY    . NASAL FRACTURE SURGERY    . THYROIDECTOMY  1980   Goiter  . TOTAL KNEE ARTHROPLASTY     Bilateral    Current Outpatient Medications  Medication Sig Dispense Refill  . acetaminophen (TYLENOL) 325 MG tablet Take 2 tablets (650 mg total) by mouth every 6 (six) hours as needed for mild pain, moderate pain, fever or headache. 15 tablet 0  . albuterol (PROVENTIL HFA;VENTOLIN HFA) 108 (90 Base) MCG/ACT inhaler Inhale 2 puffs into the lungs every 6 (six) hours as needed for wheezing or shortness of breath. 1 Inhaler 0  . ALPRAZolam (XANAX) 0.5 MG tablet Take 1 tablet (0.5 mg  total) by mouth at bedtime as needed for anxiety. 3 tablet 0  . aspirin EC 81 MG tablet Take 81 mg by mouth daily. Reported on 12/14/2015    . carvedilol (COREG) 12.5 MG tablet Take 1 tablet (12.5 mg total) by mouth 2 (two) times daily with a meal. 30 tablet 0  . fluticasone furoate-vilanterol (BREO ELLIPTA) 100-25 MCG/INH AEPB Inhale 1 puff into the lungs every morning. 28 each 0  . gabapentin (NEURONTIN) 300 MG capsule Take 1 capsule (300 mg total) by mouth 3 (three) times daily for 15 days. 45 capsule 0  . levothyroxine (SYNTHROID, LEVOTHROID) 200 MCG tablet TAKE (1) TABLET  DAILY BE- FORE BREAKFAST. (Patient taking differently: Take 200 mcg by mouth daily before breakfast. TAKE (1) TABLET DAILY BE- FORE BREAKFAST.) 90 tablet 3  . lisinopril (PRINIVIL,ZESTRIL) 20 MG tablet Take 1 tablet (20 mg total) by mouth daily. 15 tablet 0  . loperamide (IMODIUM A-D) 2 MG tablet Take 1 tablet (2 mg total) by mouth 4 (four) times daily as needed for diarrhea or loose stools. 60 tablet 3  . metFORMIN (GLUCOPHAGE) 1000 MG tablet Take 1 tablet (1,000 mg total) by mouth 2 (two) times daily. 30 tablet 0  . nitroGLYCERIN (NITROSTAT) 0.3 MG SL tablet Place 0.3 mg under the tongue every 5 (five) minutes as needed for chest pain.    Marland Kitchen. omeprazole (PRILOSEC) 20 MG capsule Take 1 capsule (20 mg total) by mouth daily. 15 capsule 0  . pravastatin (PRAVACHOL) 80 MG tablet Take 1 tablet (80 mg total) by mouth daily. 15 tablet 0  . sertraline (ZOLOFT) 50 MG tablet Take 1 tablet (50 mg total) by mouth daily. 15 tablet 0  . tamsulosin (FLOMAX) 0.4 MG CAPS capsule Take 1 capsule (0.4 mg total) by mouth daily after supper. 30 capsule 1  . torsemide (DEMADEX) 20 MG tablet TAKE 2 TABLETS BY MOUTH AT 8am & 2pm (Patient taking differently: Take 40 mg by mouth 2 (two) times daily. TAKE 2 TABLETS BY MOUTH AT 8am & 2pm) 150 tablet 0  . traMADol (ULTRAM) 50 MG tablet Take 1-2 tablets (50-100 mg total) by mouth every 12 (twelve) hours as needed. (Patient taking differently: Take 50 mg by mouth every 12 (twelve) hours as needed for severe pain. ) 80 tablet 1   No current facility-administered medications for this visit.     Allergies:   Penicillins   Social History:  The patient  reports that she has never smoked. She has never used smokeless tobacco. She reports that she does not drink alcohol or use drugs.   Family History:  The patient's  family history includes COPD in her father; Cancer in her brother, brother, daughter, sister, sister, sister, and son; Diabetes in her sister and son; Early death in her  son; Heart attack (age of onset: 1664) in her father; Heart disease in her son; Heart failure in her mother.   ROS:  Please see the history of present illness.   All other systems are personally reviewed and negative.    Exam:    Vital Signs:  BP (!) 168/53   Pulse (!) 59   Ht 5\' 6"  (1.676 m)   Wt 160 lb (72.6 kg)   BMI 25.82 kg/m    Labs/Other Tests and Data Reviewed:    Recent Labs: 02/27/2018: B Natriuretic Peptide >4,500.0 08/11/2018: ALT 15; TSH 4.140 09/08/2018: BUN 60; Creatinine, Ser 1.27; Hemoglobin 9.7; Platelets 198; Potassium 3.4; Sodium 132   Wt Readings from Last  3 Encounters:  11/26/18 160 lb (72.6 kg)  09/29/18 171 lb (77.6 kg)  09/08/18 171 lb 15.3 oz (78 kg)     Other studies personally reviewed:  Last device in office is reviewed from [employer] PDF dated 05/2018 which reveals normal device function, no arrhythmias    ASSESSMENT & PLAN:    1.  CHB - she is asymptomatic, s/p PPM insertion. 2. PPM - her medtronic DDD PM is working [employer].  3. HTN - her SBP is up a bit. With her h/o falls, I am not inclined to try and drive the pressure down.  4. COVID 19 screen The patient denies symptoms of COVID 19 at this time.  The importance of social distancing was discussed today.  Follow-up:  4 months Next remote: n/a  Current medicines are reviewed at length with the patient today.   The patient does not have concerns regarding her medicines.  The following changes were made today:  none  Labs/ tests ordered today include: none No orders of the defined types were placed in this encounter.    Patient Risk:  after full review of this patients clinical status, I feel that they are at moderate risk at this time.  Today, I have spent 15 minutes with the patient with telehealth technology discussing all of the above .    Signed, Lewayne BuntingGregg Taylor, MD  11/26/2018 9:36 AM     [facility] [address] [place] [address] [phone]  (office) [phone] (fax)

## 2018-11-26 NOTE — Patient Instructions (Signed)
Medication Instructions:  Your physician recommends that you continue on your current medications as directed. Please refer to the Current Medication list given to you today.  If you need a refill on your cardiac medications before your next appointment, please call your pharmacy.   Lab work: NONE  If you have labs (blood work) drawn today and your tests are completely normal, you will receive your results only by: Marland Kitchen MyChart Message (if you have MyChart) OR . A paper copy in the mail If you have any lab test that is abnormal or we need to change your treatment, we will call you to review the results.  Testing/Procedures: NONE   Follow-Up: At [facility], you and your health needs are our priority.  As part of our continuing mission to provide you with exceptional heart care, we have created designated Provider Care Teams.  These Care Teams include your primary Cardiologist (physician) and Advanced Practice Providers (APPs -  Physician Assistants and Nurse Practitioners) who all work together to provide you with the care you need, when you need it. You will need a follow up appointment in 4 months.  Please call our office 2 months in advance to schedule this appointment.  You may see Cristopher Peru, MD or one of the following Advanced Practice Providers on your designated Care Team:   Chanetta Marshall, NP . Tommye Standard, PA-C  Any Other Special Instructions Will Be Listed Below (If Applicable). Thank you for choosing [facility]!

## 2018-12-01 DIAGNOSIS — R69 Illness, unspecified: Secondary | ICD-10-CM | POA: Diagnosis not present

## 2018-12-08 DIAGNOSIS — S82402B Unspecified fracture of shaft of left fibula, initial encounter for open fracture type I or II: Secondary | ICD-10-CM | POA: Diagnosis not present

## 2018-12-08 DIAGNOSIS — I5020 Unspecified systolic (congestive) heart failure: Secondary | ICD-10-CM | POA: Diagnosis not present

## 2018-12-26 DIAGNOSIS — H524 Presbyopia: Secondary | ICD-10-CM | POA: Diagnosis not present

## 2019-01-03 ENCOUNTER — Inpatient Hospital Stay (HOSPITAL_COMMUNITY): Payer: Medicare HMO

## 2019-01-03 ENCOUNTER — Emergency Department (HOSPITAL_COMMUNITY): Payer: Medicare HMO

## 2019-01-03 ENCOUNTER — Other Ambulatory Visit: Payer: Self-pay

## 2019-01-03 ENCOUNTER — Encounter (HOSPITAL_COMMUNITY): Payer: Self-pay | Admitting: *Deleted

## 2019-01-03 ENCOUNTER — Inpatient Hospital Stay (HOSPITAL_COMMUNITY)
Admission: EM | Admit: 2019-01-03 | Discharge: 2019-01-10 | DRG: 064 | Disposition: E | Payer: Medicare HMO | Source: Skilled Nursing Facility | Attending: Emergency Medicine | Admitting: Emergency Medicine

## 2019-01-03 DIAGNOSIS — T17908A Unspecified foreign body in respiratory tract, part unspecified causing other injury, initial encounter: Secondary | ICD-10-CM

## 2019-01-03 DIAGNOSIS — J9690 Respiratory failure, unspecified, unspecified whether with hypoxia or hypercapnia: Secondary | ICD-10-CM

## 2019-01-03 DIAGNOSIS — Z66 Do not resuscitate: Secondary | ICD-10-CM | POA: Diagnosis not present

## 2019-01-03 DIAGNOSIS — G8191 Hemiplegia, unspecified affecting right dominant side: Secondary | ICD-10-CM | POA: Diagnosis present

## 2019-01-03 DIAGNOSIS — E1143 Type 2 diabetes mellitus with diabetic autonomic (poly)neuropathy: Secondary | ICD-10-CM | POA: Diagnosis present

## 2019-01-03 DIAGNOSIS — J9601 Acute respiratory failure with hypoxia: Secondary | ICD-10-CM | POA: Diagnosis present

## 2019-01-03 DIAGNOSIS — Z8051 Family history of malignant neoplasm of kidney: Secondary | ICD-10-CM

## 2019-01-03 DIAGNOSIS — Z20828 Contact with and (suspected) exposure to other viral communicable diseases: Secondary | ICD-10-CM | POA: Diagnosis present

## 2019-01-03 DIAGNOSIS — Z8042 Family history of malignant neoplasm of prostate: Secondary | ICD-10-CM

## 2019-01-03 DIAGNOSIS — H5213 Myopia, bilateral: Secondary | ICD-10-CM | POA: Diagnosis not present

## 2019-01-03 DIAGNOSIS — R29716 NIHSS score 16: Secondary | ICD-10-CM | POA: Diagnosis present

## 2019-01-03 DIAGNOSIS — I10 Essential (primary) hypertension: Secondary | ICD-10-CM

## 2019-01-03 DIAGNOSIS — G935 Compression of brain: Secondary | ICD-10-CM | POA: Diagnosis not present

## 2019-01-03 DIAGNOSIS — R0689 Other abnormalities of breathing: Secondary | ICD-10-CM | POA: Diagnosis not present

## 2019-01-03 DIAGNOSIS — I63413 Cerebral infarction due to embolism of bilateral middle cerebral arteries: Secondary | ICD-10-CM | POA: Diagnosis not present

## 2019-01-03 DIAGNOSIS — D638 Anemia in other chronic diseases classified elsewhere: Secondary | ICD-10-CM | POA: Diagnosis present

## 2019-01-03 DIAGNOSIS — I63512 Cerebral infarction due to unspecified occlusion or stenosis of left middle cerebral artery: Secondary | ICD-10-CM | POA: Diagnosis not present

## 2019-01-03 DIAGNOSIS — Z800 Family history of malignant neoplasm of digestive organs: Secondary | ICD-10-CM

## 2019-01-03 DIAGNOSIS — E1169 Type 2 diabetes mellitus with other specified complication: Secondary | ICD-10-CM | POA: Diagnosis present

## 2019-01-03 DIAGNOSIS — E039 Hypothyroidism, unspecified: Secondary | ICD-10-CM | POA: Diagnosis present

## 2019-01-03 DIAGNOSIS — I2510 Atherosclerotic heart disease of native coronary artery without angina pectoris: Secondary | ICD-10-CM | POA: Diagnosis present

## 2019-01-03 DIAGNOSIS — G4733 Obstructive sleep apnea (adult) (pediatric): Secondary | ICD-10-CM | POA: Diagnosis present

## 2019-01-03 DIAGNOSIS — E1151 Type 2 diabetes mellitus with diabetic peripheral angiopathy without gangrene: Secondary | ICD-10-CM | POA: Diagnosis not present

## 2019-01-03 DIAGNOSIS — I639 Cerebral infarction, unspecified: Secondary | ICD-10-CM | POA: Diagnosis not present

## 2019-01-03 DIAGNOSIS — Z9911 Dependence on respirator [ventilator] status: Secondary | ICD-10-CM | POA: Diagnosis not present

## 2019-01-03 DIAGNOSIS — Z9049 Acquired absence of other specified parts of digestive tract: Secondary | ICD-10-CM

## 2019-01-03 DIAGNOSIS — Z01818 Encounter for other preprocedural examination: Secondary | ICD-10-CM

## 2019-01-03 DIAGNOSIS — Z801 Family history of malignant neoplasm of trachea, bronchus and lung: Secondary | ICD-10-CM

## 2019-01-03 DIAGNOSIS — F0390 Unspecified dementia without behavioral disturbance: Secondary | ICD-10-CM | POA: Diagnosis present

## 2019-01-03 DIAGNOSIS — J690 Pneumonitis due to inhalation of food and vomit: Secondary | ICD-10-CM | POA: Diagnosis present

## 2019-01-03 DIAGNOSIS — Z7984 Long term (current) use of oral hypoglycemic drugs: Secondary | ICD-10-CM

## 2019-01-03 DIAGNOSIS — Z430 Encounter for attention to tracheostomy: Secondary | ICD-10-CM | POA: Diagnosis not present

## 2019-01-03 DIAGNOSIS — Z833 Family history of diabetes mellitus: Secondary | ICD-10-CM

## 2019-01-03 DIAGNOSIS — J449 Chronic obstructive pulmonary disease, unspecified: Secondary | ICD-10-CM | POA: Diagnosis not present

## 2019-01-03 DIAGNOSIS — T502X5A Adverse effect of carbonic-anhydrase inhibitors, benzothiadiazides and other diuretics, initial encounter: Secondary | ICD-10-CM | POA: Diagnosis present

## 2019-01-03 DIAGNOSIS — E1159 Type 2 diabetes mellitus with other circulatory complications: Secondary | ICD-10-CM | POA: Diagnosis not present

## 2019-01-03 DIAGNOSIS — G9340 Encephalopathy, unspecified: Secondary | ICD-10-CM | POA: Diagnosis not present

## 2019-01-03 DIAGNOSIS — Z8052 Family history of malignant neoplasm of bladder: Secondary | ICD-10-CM

## 2019-01-03 DIAGNOSIS — I152 Hypertension secondary to endocrine disorders: Secondary | ICD-10-CM | POA: Diagnosis present

## 2019-01-03 DIAGNOSIS — Z950 Presence of cardiac pacemaker: Secondary | ICD-10-CM

## 2019-01-03 DIAGNOSIS — Z515 Encounter for palliative care: Secondary | ICD-10-CM | POA: Diagnosis not present

## 2019-01-03 DIAGNOSIS — Z7189 Other specified counseling: Secondary | ICD-10-CM | POA: Diagnosis not present

## 2019-01-03 DIAGNOSIS — Z7982 Long term (current) use of aspirin: Secondary | ICD-10-CM

## 2019-01-03 DIAGNOSIS — Z978 Presence of other specified devices: Secondary | ICD-10-CM

## 2019-01-03 DIAGNOSIS — I63412 Cerebral infarction due to embolism of left middle cerebral artery: Secondary | ICD-10-CM | POA: Diagnosis not present

## 2019-01-03 DIAGNOSIS — I5042 Chronic combined systolic (congestive) and diastolic (congestive) heart failure: Secondary | ICD-10-CM | POA: Diagnosis not present

## 2019-01-03 DIAGNOSIS — R414 Neurologic neglect syndrome: Secondary | ICD-10-CM | POA: Diagnosis present

## 2019-01-03 DIAGNOSIS — H5702 Anisocoria: Secondary | ICD-10-CM | POA: Diagnosis present

## 2019-01-03 DIAGNOSIS — H9190 Unspecified hearing loss, unspecified ear: Secondary | ICD-10-CM | POA: Diagnosis present

## 2019-01-03 DIAGNOSIS — F419 Anxiety disorder, unspecified: Secondary | ICD-10-CM | POA: Diagnosis present

## 2019-01-03 DIAGNOSIS — I6523 Occlusion and stenosis of bilateral carotid arteries: Secondary | ICD-10-CM | POA: Diagnosis present

## 2019-01-03 DIAGNOSIS — R4701 Aphasia: Secondary | ICD-10-CM | POA: Diagnosis present

## 2019-01-03 DIAGNOSIS — E876 Hypokalemia: Secondary | ICD-10-CM | POA: Diagnosis not present

## 2019-01-03 DIAGNOSIS — Z880 Allergy status to penicillin: Secondary | ICD-10-CM

## 2019-01-03 DIAGNOSIS — R404 Transient alteration of awareness: Secondary | ICD-10-CM | POA: Diagnosis not present

## 2019-01-03 DIAGNOSIS — Z4682 Encounter for fitting and adjustment of non-vascular catheter: Secondary | ICD-10-CM | POA: Diagnosis not present

## 2019-01-03 DIAGNOSIS — R531 Weakness: Secondary | ICD-10-CM | POA: Diagnosis not present

## 2019-01-03 DIAGNOSIS — Z79899 Other long term (current) drug therapy: Secondary | ICD-10-CM

## 2019-01-03 DIAGNOSIS — R000 Tachycardia, unspecified: Secondary | ICD-10-CM | POA: Diagnosis not present

## 2019-01-03 DIAGNOSIS — E1142 Type 2 diabetes mellitus with diabetic polyneuropathy: Secondary | ICD-10-CM | POA: Diagnosis not present

## 2019-01-03 DIAGNOSIS — T17908D Unspecified foreign body in respiratory tract, part unspecified causing other injury, subsequent encounter: Secondary | ICD-10-CM | POA: Diagnosis not present

## 2019-01-03 DIAGNOSIS — E1165 Type 2 diabetes mellitus with hyperglycemia: Secondary | ICD-10-CM | POA: Diagnosis present

## 2019-01-03 DIAGNOSIS — Z90710 Acquired absence of both cervix and uterus: Secondary | ICD-10-CM

## 2019-01-03 DIAGNOSIS — Z825 Family history of asthma and other chronic lower respiratory diseases: Secondary | ICD-10-CM

## 2019-01-03 DIAGNOSIS — I517 Cardiomegaly: Secondary | ICD-10-CM | POA: Diagnosis not present

## 2019-01-03 DIAGNOSIS — K219 Gastro-esophageal reflux disease without esophagitis: Secondary | ICD-10-CM | POA: Diagnosis present

## 2019-01-03 DIAGNOSIS — Z79890 Hormone replacement therapy (postmenopausal): Secondary | ICD-10-CM

## 2019-01-03 DIAGNOSIS — Z8249 Family history of ischemic heart disease and other diseases of the circulatory system: Secondary | ICD-10-CM

## 2019-01-03 DIAGNOSIS — Z209 Contact with and (suspected) exposure to unspecified communicable disease: Secondary | ICD-10-CM | POA: Diagnosis not present

## 2019-01-03 DIAGNOSIS — Z96653 Presence of artificial knee joint, bilateral: Secondary | ICD-10-CM | POA: Diagnosis present

## 2019-01-03 DIAGNOSIS — Z79891 Long term (current) use of opiate analgesic: Secondary | ICD-10-CM

## 2019-01-03 DIAGNOSIS — G936 Cerebral edema: Secondary | ICD-10-CM | POA: Diagnosis present

## 2019-01-03 DIAGNOSIS — E785 Hyperlipidemia, unspecified: Secondary | ICD-10-CM | POA: Diagnosis present

## 2019-01-03 DIAGNOSIS — I340 Nonrheumatic mitral (valve) insufficiency: Secondary | ICD-10-CM | POA: Diagnosis not present

## 2019-01-03 LAB — DIFFERENTIAL
Abs Immature Granulocytes: 0.03 10*3/uL (ref 0.00–0.07)
Basophils Absolute: 0 10*3/uL (ref 0.0–0.1)
Basophils Relative: 0 %
Eosinophils Absolute: 0 10*3/uL (ref 0.0–0.5)
Eosinophils Relative: 0 %
Immature Granulocytes: 0 %
Lymphocytes Relative: 20 %
Lymphs Abs: 1.5 10*3/uL (ref 0.7–4.0)
Monocytes Absolute: 0.4 10*3/uL (ref 0.1–1.0)
Monocytes Relative: 6 %
Neutro Abs: 5.6 10*3/uL (ref 1.7–7.7)
Neutrophils Relative %: 74 %

## 2019-01-03 LAB — BLOOD GAS, ARTERIAL
Acid-Base Excess: 4.3 mmol/L — ABNORMAL HIGH (ref 0.0–2.0)
Bicarbonate: 27.8 mmol/L (ref 20.0–28.0)
Drawn by: 331001
FIO2: 100
O2 Saturation: 99.5 %
Patient temperature: 36.9
pCO2 arterial: 48.5 mmHg — ABNORMAL HIGH (ref 32.0–48.0)
pH, Arterial: 7.393 (ref 7.350–7.450)
pO2, Arterial: 410 mmHg — ABNORMAL HIGH (ref 83.0–108.0)

## 2019-01-03 LAB — CBC
HCT: 35.7 % — ABNORMAL LOW (ref 36.0–46.0)
Hemoglobin: 11.2 g/dL — ABNORMAL LOW (ref 12.0–15.0)
MCH: 25.6 pg — ABNORMAL LOW (ref 26.0–34.0)
MCHC: 31.4 g/dL (ref 30.0–36.0)
MCV: 81.5 fL (ref 80.0–100.0)
Platelets: 220 10*3/uL (ref 150–400)
RBC: 4.38 MIL/uL (ref 3.87–5.11)
RDW: 14.9 % (ref 11.5–15.5)
WBC: 7.5 10*3/uL (ref 4.0–10.5)
nRBC: 0 % (ref 0.0–0.2)

## 2019-01-03 LAB — COMPREHENSIVE METABOLIC PANEL
ALT: 12 U/L (ref 0–44)
AST: 20 U/L (ref 15–41)
Albumin: 3.7 g/dL (ref 3.5–5.0)
Alkaline Phosphatase: 99 U/L (ref 38–126)
Anion gap: 11 (ref 5–15)
BUN: 73 mg/dL — ABNORMAL HIGH (ref 8–23)
CO2: 29 mmol/L (ref 22–32)
Calcium: 9.6 mg/dL (ref 8.9–10.3)
Chloride: 94 mmol/L — ABNORMAL LOW (ref 98–111)
Creatinine, Ser: 1.16 mg/dL — ABNORMAL HIGH (ref 0.44–1.00)
GFR calc Af Amer: 50 mL/min — ABNORMAL LOW (ref >60)
GFR calc non Af Amer: 43 mL/min — ABNORMAL LOW (ref >60)
Glucose, Bld: 254 mg/dL — ABNORMAL HIGH (ref 70–99)
Potassium: 3.7 mmol/L (ref 3.5–5.1)
Sodium: 134 mmol/L — ABNORMAL LOW (ref 135–145)
Total Bilirubin: 0.6 mg/dL (ref 0.3–1.2)
Total Protein: 7.9 g/dL (ref 6.5–8.1)

## 2019-01-03 LAB — PROTIME-INR
INR: 1.1 (ref 0.8–1.2)
Prothrombin Time: 13.7 seconds (ref 11.4–15.2)

## 2019-01-03 LAB — APTT: aPTT: 30 seconds (ref 24–36)

## 2019-01-03 LAB — I-STAT CREATININE, ED: Creatinine, Ser: 1.2 mg/dL — ABNORMAL HIGH (ref 0.44–1.00)

## 2019-01-03 LAB — SARS CORONAVIRUS 2 BY RT PCR (HOSPITAL ORDER, PERFORMED IN ~~LOC~~ HOSPITAL LAB): SARS Coronavirus 2: NEGATIVE

## 2019-01-03 MED ORDER — PROPOFOL 1000 MG/100ML IV EMUL
INTRAVENOUS | Status: AC
  Administered 2019-01-03: 10 ug/kg/min via INTRAVENOUS
  Filled 2019-01-03: qty 100

## 2019-01-03 MED ORDER — ACETAMINOPHEN 325 MG PO TABS: 650.0000 mg | ORAL_TABLET | ORAL | Status: DC | PRN

## 2019-01-03 MED ORDER — ETOMIDATE 2 MG/ML IV SOLN
20.0000 mg | Freq: Once | INTRAVENOUS | Status: AC
  Administered 2019-01-03: 20 mg via INTRAVENOUS

## 2019-01-03 MED ORDER — IOPAMIDOL (ISOVUE-370) INJECTION 76%
115.0000 mL | Freq: Once | INTRAVENOUS | Status: AC | PRN
  Administered 2019-01-03: 19:00:00 via INTRAVENOUS

## 2019-01-03 MED ORDER — CLINDAMYCIN PHOSPHATE 600 MG/50ML IV SOLN
600.0000 mg | Freq: Three times a day (TID) | INTRAVENOUS | Status: DC
  Administered 2019-01-03: 600 mg via INTRAVENOUS
  Filled 2019-01-03 (×2): qty 50

## 2019-01-03 MED ORDER — HEPARIN SODIUM (PORCINE) 5000 UNIT/ML IJ SOLN
5000.0000 [IU] | Freq: Three times a day (TID) | INTRAMUSCULAR | Status: DC
  Filled 2019-01-03: qty 1

## 2019-01-03 MED ORDER — ACETAMINOPHEN 650 MG RE SUPP: 650.0000 mg | RECTAL | Status: DC | PRN

## 2019-01-03 MED ORDER — SODIUM CHLORIDE 0.9 % IV SOLN
INTRAVENOUS | Status: DC
  Administered 2019-01-04 – 2019-01-06 (×2): via INTRAVENOUS

## 2019-01-03 MED ORDER — ASPIRIN 325 MG PO TABS
325.0000 mg | ORAL_TABLET | Freq: Every day | ORAL | Status: DC
  Administered 2019-01-04 – 2019-01-06 (×2): 325 mg via ORAL
  Filled 2019-01-03 (×3): qty 1

## 2019-01-03 MED ORDER — FLUTICASONE FUROATE-VILANTEROL 100-25 MCG/INH IN AEPB
1.0000 | INHALATION_SPRAY | Freq: Every morning | RESPIRATORY_TRACT | Status: DC
  Filled 2019-01-03: qty 28

## 2019-01-03 MED ORDER — VANCOMYCIN HCL 1.25 G IV SOLR
1250.0000 mg | INTRAVENOUS | Status: DC
  Filled 2019-01-03: qty 1250

## 2019-01-03 MED ORDER — VANCOMYCIN HCL 1.5 G IV SOLR
1500.0000 mg | Freq: Once | INTRAVENOUS | Status: DC
  Filled 2019-01-03: qty 1500

## 2019-01-03 MED ORDER — ASPIRIN 300 MG RE SUPP
300.0000 mg | Freq: Every day | RECTAL | Status: DC
  Administered 2019-01-05: 300 mg via RECTAL
  Filled 2019-01-03 (×2): qty 1

## 2019-01-03 MED ORDER — HYDRALAZINE HCL 20 MG/ML IJ SOLN
5.0000 mg | Freq: Once | INTRAMUSCULAR | Status: AC
  Administered 2019-01-03: 5 mg via INTRAVENOUS
  Filled 2019-01-03: qty 1

## 2019-01-03 MED ORDER — ROCURONIUM BROMIDE 50 MG/5ML IV SOLN
100.0000 mg | Freq: Once | INTRAVENOUS | Status: AC
  Administered 2019-01-03: 100 mg via INTRAVENOUS

## 2019-01-03 MED ORDER — STROKE: EARLY STAGES OF RECOVERY BOOK
Freq: Once | Status: AC
  Administered 2019-01-04
  Filled 2019-01-03: qty 1

## 2019-01-03 MED ORDER — ACETAMINOPHEN 160 MG/5ML PO SOLN: 650.0000 mg | ORAL | Status: DC | PRN

## 2019-01-03 MED ORDER — SODIUM CHLORIDE 0.9% FLUSH: 3.0000 mL | Freq: Once | INTRAVENOUS | Status: DC

## 2019-01-03 MED ORDER — PROPOFOL 1000 MG/100ML IV EMUL
5.0000 ug/kg/min | INTRAVENOUS | Status: DC
  Administered 2019-01-03: 10 ug/kg/min via INTRAVENOUS
  Administered 2019-01-04: 5 ug/kg/min via INTRAVENOUS
  Administered 2019-01-04: 20 ug/kg/min via INTRAVENOUS
  Administered 2019-01-05: 5 ug/kg/min via INTRAVENOUS
  Filled 2019-01-03 (×5): qty 100

## 2019-01-03 MED ORDER — HYDRALAZINE HCL 20 MG/ML IJ SOLN
5.0000 mg | INTRAMUSCULAR | Status: DC | PRN
  Administered 2019-01-04 – 2019-01-05 (×3): 5 mg via INTRAVENOUS
  Filled 2019-01-03 (×3): qty 1

## 2019-01-03 MED ORDER — INSULIN ASPART 100 UNIT/ML ~~LOC~~ SOLN: 0.0000 [IU] | SUBCUTANEOUS | Status: DC

## 2019-01-03 NOTE — ED Notes (Signed)
Spoke with nurse at [facility] and they state pt is a ward of the state and DSS is her guardian. Spoke with her daughter who confirmed this.

## 2019-01-03 NOTE — ED Notes (Signed)
Pt transported to CT ?

## 2019-01-03 NOTE — ED Notes (Signed)
This RN informed of pt oxygen saturation of 65%. Pt having difficulty breathing and lips were becoming blue in color. Oxygen placed on pt via NRB. Dr. Sabra Heck informed.

## 2019-01-03 NOTE — ED Provider Notes (Addendum)
[facility] EMERGENCY DEPARTMENT Provider Note   CSN: [id number] Arrival date & time: Jan 29, 2019  4098  An emergency department physician performed an initial assessment on this suspected stroke patient at 0740.  History   Chief Complaint Chief Complaint  Patient presents with  . Cerebrovascular Accident    HPI [name] [name] is a 85 y.o. female.     Level 5 caveat for obtundation.  Uncertain last known normal.  Staff noted a change in her behavior at 0400 today while passing meds.  Glucose 247.  EMS reports left sided gaze and right arm and leg weakness.     Past Medical History:  Diagnosis Date  . ANXIETY   . Arteriosclerotic cardiovascular disease (ASCVD)    Cath in 9/99-70% mid LAD with diffuse distal disease, 70% T1, 60% mid circumflex, 50% mid RCA, normal ejection fraction; negative stress nuclear in 07/2008  . Carpal tunnel syndrome   . Cerebrovascular disease    carotid bruits; no focal disease in 1999, 2002 and 2006  . Closed left fibular fracture 11/30/11  . COPD   . Dementia (HCC)   . Diabetes mellitus    Insulin treatment  . Diastolic CHF, chronic (HCC)    Normal EF  . Gastroesophageal reflux disease    With hiatal hernia; irritable bowel syndrome; H. pylori-treated; Colonoscopy 2008: non-specific colitis, IH, Diverticulosis, Rectal ulcer secondary to ASA  . HOH (hard of hearing)   . Hyperlipidemia   . Hypertension   . Hypothyroidism   . OSTEOARTHRITIS, KNEES, BILATERAL    Bilateral TKA  . Pacemaker   . Peripheral neuropathy   . Sinoatrial node dysfunction ([facility]) 2003   Medtronic dual-chamber device  . Sleep apnea    pt could not tolerate  . VITAMIN B12 DEFICIENCY     Patient Active Problem List   Diagnosis Date Noted  . Palliative care by specialist   . DNR (do not resuscitate) discussion   . CHF exacerbation (HCC) 02/27/2018  . Fall at home, initial encounter 02/23/2018  . Fall 02/23/2018  . Aortic atherosclerosis (HCC) 10/15/2017  . Chronic  combined systolic (congestive) and diastolic (congestive) heart failure (HCC) 07/02/2017  . Pleural effusion   . Palliative care encounter   . Goals of care, counseling/discussion   . Pressure injury of skin 06/29/2017  . Elevated troponin 06/28/2017  . Normocytic anemia 06/28/2017  . Sleep apnea 06/28/2017  . Tinea cruris 06/28/2017  . Hyperlipidemia associated with type 2 diabetes mellitus (HCC) 05/14/2017  . Chronic back pain 01/28/2017  . PVD (peripheral vascular disease) (HCC) 01/27/2016  . Skin moniliasis 05/31/2014  . OAB (overactive bladder) 12/23/2012  . Insomnia 04/06/2011  . Sinoatrial node dysfunction (HCC)   . Gastroesophageal reflux disease   . Type II diabetes mellitus with peripheral autonomic neuropathy (HCC)   . GAD (generalized anxiety disorder) 12/30/2009  . Diabetic neuropathy (HCC) 12/26/2009  . Vitamin B 12 deficiency 10/11/2009  . Arteriosclerotic cardiovascular disease (ASCVD) 09/15/2009  . CEREBROVASCULAR DISEASE 09/15/2009  . Hypertension associated with diabetes (HCC) 08/29/2009  . Osteoarthrosis involving lower leg 08/29/2009  . COLONIC POLYPS, ADENOMATOUS, HX OF 02/22/2009  . Hypothyroid 02/17/2009  . Chronic obstructive pulmonary disease (HCC) 02/17/2009  . Chronic diastolic heart failure (HCC) 12/15/2008  . PPM-Medtronic 12/15/2008    Past Surgical History:  Procedure Laterality Date  . ABDOMINAL HYSTERECTOMY    . BLADDER REPAIR    . CARDIAC PACEMAKER PLACEMENT  2003   Medtronic, dual-chamber  . CARPAL TUNNEL RELEASE    .  CATARACT EXTRACTION W/PHACO Left 08/09/2017   Procedure: CATARACT EXTRACTION PHACO AND INTRAOCULAR LENS PLACEMENT (IOC);  Surgeon: Fabio PierceWrzosek, James, MD;  Location: [facility];  Service: Ophthalmology;  Laterality: Left;  CDE: 6.26  . CHOLECYSTECTOMY    . COLONOSCOPY  2008  . HEMORRHOID SURGERY    . NASAL FRACTURE SURGERY    . THYROIDECTOMY  1980   Goiter  . TOTAL KNEE ARTHROPLASTY     Bilateral     OB History   No obstetric  history on file.      Home Medications    Prior to Admission medications   Medication Sig Start Date End Date Taking? Authorizing Provider  acetaminophen (TYLENOL) 325 MG tablet Take 2 tablets (650 mg total) by mouth every 6 (six) hours as needed for mild pain, moderate pain, fever or headache. 07/21/18  Yes Samuel JesterMcManus, Kathleen, DO  albuterol (PROVENTIL HFA;VENTOLIN HFA) 108 (90 Base) MCG/ACT inhaler Inhale 2 puffs into the lungs every 6 (six) hours as needed for wheezing or shortness of breath. 07/21/18  Yes Samuel JesterMcManus, Kathleen, DO  ALPRAZolam Prudy Feeler(XANAX) 0.5 MG tablet Take 1 tablet (0.5 mg total) by mouth at bedtime as needed for anxiety. 07/21/18  Yes Samuel JesterMcManus, Kathleen, DO  aspirin EC 81 MG tablet Take 81 mg by mouth daily. Reported on 12/14/2015   Yes [provider]  carvedilol (COREG) 12.5 MG tablet Take 1 tablet (12.5 mg total) by mouth 2 (two) times daily with a meal. 07/21/18  Yes Samuel JesterMcManus, Kathleen, DO  fluticasone furoate-vilanterol (BREO ELLIPTA) 100-25 MCG/INH AEPB Inhale 1 puff into the lungs every morning. 07/21/18  Yes Samuel JesterMcManus, Kathleen, DO  gabapentin (NEURONTIN) 300 MG capsule Take 1 capsule (300 mg total) by mouth 3 (three) times daily for 15 days. 07/21/18 12/12/2018 Yes Samuel JesterMcManus, Kathleen, DO  levothyroxine (SYNTHROID, LEVOTHROID) 200 MCG tablet TAKE (1) TABLET DAILY BE- FORE BREAKFAST. Patient taking differently: Take 200 mcg by mouth daily before breakfast. TAKE (1) TABLET DAILY BE- FORE BREAKFAST. 05/13/18  Yes Hawks, Christy A, FNP  lisinopril (PRINIVIL,ZESTRIL) 20 MG tablet Take 1 tablet (20 mg total) by mouth daily. 07/21/18  Yes Samuel JesterMcManus, Kathleen, DO  loperamide (IMODIUM A-D) 2 MG tablet Take 1 tablet (2 mg total) by mouth 4 (four) times daily as needed for diarrhea or loose stools. 08/11/18  Yes Hawks, Christy A, FNP  metFORMIN (GLUCOPHAGE) 500 MG tablet Take 500 mg by mouth 2 (two) times daily with a meal.   Yes [provider]  nitroGLYCERIN (NITROSTAT) 0.3 MG SL tablet  Place 0.3 mg under the tongue every 5 (five) minutes as needed for chest pain.   Yes [provider]  nystatin-triamcinolone (MYCOLOG II) cream Apply 1 application topically 3 (three) times daily.   Yes [provider]  omeprazole (PRILOSEC) 20 MG capsule Take 1 capsule (20 mg total) by mouth daily. 07/21/18  Yes Samuel JesterMcManus, Kathleen, DO  pravastatin (PRAVACHOL) 80 MG tablet Take 1 tablet (80 mg total) by mouth daily. 07/21/18  Yes Samuel JesterMcManus, Kathleen, DO  sertraline (ZOLOFT) 50 MG tablet Take 1 tablet (50 mg total) by mouth daily. 07/21/18  Yes Samuel JesterMcManus, Kathleen, DO  tamsulosin (FLOMAX) 0.4 MG CAPS capsule Take 1 capsule (0.4 mg total) by mouth daily after supper. 02/25/18  Yes Emokpae, Courage, MD  torsemide (DEMADEX) 20 MG tablet TAKE 2 TABLETS BY MOUTH AT 8am & 2pm Patient taking differently: Take 40 mg by mouth 2 (two) times daily. TAKE 2 TABLETS BY MOUTH AT 8am & 2pm 09/12/18  Yes Junie SpencerHawks, Christy A, FNP  traMADol (ULTRAM) 50 MG tablet Take 1-2 tablets (50-100 mg total) by mouth every 12 (twelve) hours as needed. Patient taking differently: Take 50 mg by mouth every 12 (twelve) hours as needed for severe pain.  06/25/18  Yes Hawks, Christy A, FNP  metFORMIN (GLUCOPHAGE) 1000 MG tablet Take 1 tablet (1,000 mg total) by mouth 2 (two) times daily. Patient not taking: Reported on 01/09/2019 07/21/18   Francine Graven, DO    Family History Family History  Problem Relation Age of Onset  . COPD Father   . Heart attack Father 29  . Heart failure Mother   . Cancer Sister        colon  . Cancer Brother        lung  . Cancer Sister        bladder  . Cancer Brother        lung  . Early death Son   . Diabetes Son   . Heart disease Son   . Cancer Son        prostate  . Cancer Sister        kidney cancer  . Diabetes Sister   . Cancer Daughter     Social History Social History   Tobacco Use  . Smoking status: Never Smoker  . Smokeless tobacco: Never Used  Substance Use Topics   . Alcohol use: No    Alcohol/week: 0.0 standard drinks  . Drug use: No     Allergies   Penicillins   Review of Systems Review of Systems  Unable to perform ROS: Acuity of condition     Physical Exam Updated Vital Signs BP (!) 192/62   Pulse 60   Temp 97.8 F (36.6 C) (Oral)   Resp 17   Ht 5\' 8"  (1.727 m)   Wt 86.1 kg   SpO2 100%   BMI 28.86 kg/m   Physical Exam Vitals signs and nursing note reviewed.  Constitutional:      Appearance: She is well-developed.     Comments: Obtunded; aphasic  HENT:     Head: Normocephalic and atraumatic.  Eyes:     Conjunctiva/sclera: Conjunctivae normal.  Neck:     Musculoskeletal: Neck supple.  Cardiovascular:     Rate and Rhythm: Normal rate and regular rhythm.  Pulmonary:     Effort: Pulmonary effort is normal.     Breath sounds: Normal breath sounds.  Abdominal:     General: Bowel sounds are normal.     Palpations: Abdomen is soft.  Musculoskeletal:     Comments: unable  Skin:    General: Skin is warm and dry.  Neurological:     Comments: Left-sided gaze; flaccid right arm and leg  Psychiatric:     Comments: Unable      ED Treatments / Results  Labs (all labs ordered are listed, but only abnormal results are displayed) Labs Reviewed  CBC - Abnormal; Notable for the following components:      Result Value   Hemoglobin 11.2 (*)    HCT 35.7 (*)    MCH 25.6 (*)    All other components within normal limits  COMPREHENSIVE METABOLIC PANEL - Abnormal; Notable for the following components:   Sodium 134 (*)    Chloride 94 (*)    Glucose, Bld 254 (*)    BUN 73 (*)    Creatinine, Ser 1.16 (*)    GFR calc non Af Amer 43 (*)    GFR calc Af Amer 50 (*)  All other components within normal limits  I-STAT CREATININE, ED - Abnormal; Notable for the following components:   Creatinine, Ser 1.20 (*)    All other components within normal limits  PROTIME-INR  APTT  DIFFERENTIAL  I-STAT CHEM 8, ED  CBG MONITORING, ED     EKG EKG Interpretation  Date/Time:  Saturday January 03 2019 07:18:29 EDT Ventricular Rate:  76 PR Interval:    QRS Duration: 221 QT Interval:  589 QTC Calculation: 663 R Axis:   -76 Text Interpretation:  Atrial-ventricular dual-paced rhythm No further analysis attempted due to paced rhythm Confirmed by Donnetta Hutchingook, Delyla Sandeen ([id number]) on 12/29/2018 9:33:33 AM   Radiology Ct Head Wo Contrast  Result Date: 12/21/2018 CLINICAL DATA:  Stroke suspected.  Left MCA syndrome EXAM: CT HEAD WITHOUT CONTRAST TECHNIQUE: Contiguous axial images were obtained from the base of the skull through the vertex without intravenous contrast. COMPARISON:  07/20/2018 FINDINGS: Brain: Cytotoxic edema throughout the cortex of the left MCA territory, sparing the basal ganglia and internal capsule. No hemorrhage, hydrocephalus, or collection. Pre-existing lacune in the left centrum semiovale. Vascular: Hyperdense left M1 and A1 segments. No ACA territory ischemia seen. Skull: Negative Sinuses/Orbits: No acute finding.  Left cataract resection. Other: These results were called by telephone at the time of interpretation on 12/21/2018 at 8:00 am to Dr. Donnetta HutchingBRIAN Jaydyn Bozzo , who verbally acknowledged these results. Patient is in renal failure, acute indeterminate, and given the extent of infarct will wait on neurology input on timing of CTA and perfusion imaging which has been ordered as well. IMPRESSION: 1. Large acute left MCA territory infarct.ASPECTS is 3. 2. Hyperdense left M1 and A1 segments.  No evidence of ACA ischemia. 3. No hemorrhagic transformation. Electronically Signed   By: Marnee SpringJonathon  Watts M.D.   On: 01/02/2019 08:05   Dg Chest Port 1 View  Result Date: 12/27/2018 CLINICAL DATA:  Possible aspiration EXAM: PORTABLE CHEST 1 VIEW COMPARISON:  09/08/2018 FINDINGS: Cardiomegaly and LEFT-sided pacemaker again noted. This is a mildly low volume film. There is no evidence of focal airspace disease, pulmonary edema, suspicious pulmonary  nodule/mass, pleural effusion, or pneumothorax. No acute bony abnormalities are identified. IMPRESSION: Cardiomegaly without evidence of acute cardiopulmonary disease. Electronically Signed   By: Harmon PierJeffrey  Hu M.D.   On: 12/31/2018 11:14    Procedures Procedures (including critical care time)  Medications Ordered in ED Medications  sodium chloride flush (NS) 0.9 % injection 3 mL (3 mLs Intravenous Not Given 12/24/2018 0737)     Initial Impression / Assessment and Plan / ED Course  I have reviewed the triage vital signs and the nursing notes.  Pertinent labs & imaging results that were available during my care of the patient were reviewed by me and considered in my medical decision making (see chart for details).        History and physical consistent with left brain stroke with right-sided involvement.  Last normal uncertain.  Will obtain CT head and tele-neurology consult.  1030: Patient rechecked multiple times.  Head CT reveals a large acute MCA infarct.  Tele-neurology consultation obtained.  No aggressive intervention recommended.  Will admit to general medicine.  CRITICAL CARE Performed by: Donnetta HutchingBrian Harsh Trulock Total critical care time: 35 minutes Critical care time was exclusive of separately billable procedures and treating other patients. Critical care was necessary to treat or prevent imminent or life-threatening deterioration. Critical care was time spent personally by me on the following activities: development of treatment plan with patient and/or surrogate as well as  nursing, discussions with consultants, evaluation of patient's response to treatment, examination of patient, obtaining history from patient or surrogate, ordering and performing treatments and interventions, ordering and review of laboratory studies, ordering and review of radiographic studies, pulse oximetry and re-evaluation of patient's condition. Final Clinical Impressions(s) / ED Diagnoses   Final diagnoses:   Cerebrovascular accident (CVA), unspecified mechanism [facility](HCC)    ED Discharge Orders    None       Donnetta Hutchingook, Audi Conover, MD 08-Oct-2018 [id number]    Donnetta Hutchingook, Paisyn Guercio, MD 08-Oct-[phone]    Donnetta Hutchingook, Deshawnda Acrey, MD 08-Oct-2018 [id number]

## 2019-01-03 NOTE — ED Notes (Signed)
Attempt family contact, daughter Tawni Millers.

## 2019-01-03 NOTE — ED Notes (Signed)
Spoke with on call DSS worker who will page APS supervisor to return my call regarding decision making for this patient.

## 2019-01-03 NOTE — ED Provider Notes (Addendum)
I was called to the bedside to see the patient upon return from CT scan.  She had vomited and had aspirated, she was hypoxic and cyanotic in the fingertips.  Due to the patient's full CODE STATUS and her inability to communicate with [place] I was required to oxygenate with bag-valve-mask and assist with intubation.  The patient's intubation went very smoothly, see procedure note below.  The patient is critically ill, we continue to resuscitate oxygenate and add antibiotics to cover for aspiration pneumonia.  I discussed her care with the hospitalist at around 7:25 PM, he will come to see.  The patient is going to the intensive care unit.  We will page critical care to discuss.  Additionally for sedation the patient was started on propofol.  She is very hypertensive at this time measuring 222/91.  May need to give some antihypertensives  Discussed with Dr. Prudencio Burly n of the intensivist service for transfer at 8:45 PM - has accepted transfer to [facility] ICU - temp orders placed.  .Critical Care Performed by: Noemi Chapel, MD Authorized by: Noemi Chapel, MD   Critical care provider statement:    Critical care time (minutes):  45   Critical care was necessary to treat or prevent imminent or life-threatening deterioration of the following conditions:  CNS failure or compromise and respiratory failure   Critical care was time spent personally by me on the following activities:  Discussions with consultants, evaluation of patient's response to treatment, examination of patient, ordering and performing treatments and interventions, ordering and review of laboratory studies, ordering and review of radiographic studies, pulse oximetry, re-evaluation of patient's condition, obtaining history from patient or surrogate and review of old charts Comments:        Procedure Name: Intubation Date/Time: 01/02/2019 7:29 PM Performed by: Noemi Chapel, MD Pre-anesthesia Checklist: Patient identified, Patient being  monitored, Emergency Drugs available, Timeout performed and Suction available Oxygen Delivery Method: Non-rebreather mask Preoxygenation: Pre-oxygenation with 100% oxygen Induction Type: Rapid sequence Ventilation: Mask ventilation without difficulty Laryngoscope Size: Mac and 3 Grade View: Grade II Tube size: 7.5 mm Number of attempts: 1 Airway Equipment and Method: Stylet Placement Confirmation: ETT inserted through vocal cords under direct vision,  CO2 detector and Breath sounds checked- equal and bilateral Secured at: 21 cm Tube secured with: ETT holder Dental Injury: Teeth and Oropharynx as per pre-operative assessment  Difficulty Due To: Difficulty was unanticipated Comments:        Final diagnoses:  Cerebrovascular accident (CVA), unspecified mechanism ([place])       Noemi Chapel, MD 12/17/2018 Hoy Register    Noemi Chapel, MD 12/26/2018 2059

## 2019-01-03 NOTE — ED Notes (Signed)
Pt daughter Donnamarie Rossetti [phone]

## 2019-01-03 NOTE — H&P (Signed)
NAME:  ZYLEE MARCHIANO, MRN:  [id number], DOB:  1933/01/27, LOS: 0 ADMISSION DATE:  12/29/2018, CONSULTATION DATE:  01/04/19 CHIEF COMPLAINT:  CVA   Brief History   This is a 85 yo female with history of chronic CHF, CAD, COPD, Dementia, HTN, and DM2. Patient apparently lives at assisted living facility. Here for further management of CVA and hypoxic respiratory failure.   History of present illness   This is a 85 yo female with history of chronic CHF, CAD, COPD, Dementia, HTN, and DM2. Patient apparently lives at assited living facility. She can ambulate at baseline per daughter. Staff noted patient to be more lethargic and flacid right arm. Brought into ED for stroke. Got CT head that confirmed Right MCA infarct. Got CT angiogram as well. Apparently while on way to CT patient aspirated. Had respiratory failure with sats around 65% and got intubated  Past Medical History  CHF HTN CAD COPD DM2 Dementia  Significant Hospital Events   Patient intubated on 12/17/2018  Consults:  Neurology  Procedures:  Intubation  Significant Diagnostic Tests:  CT on 7/25 as noted below:  IMPRESSION: 1. Large left MCA territory infarct.  Core infarct volume is 92 mL. 2. Partially occlusive thrombus in the proximal left M1 and A1 segments. This may represent some reperfusion from earlier occlusion. 3. Atherosclerotic changes within the cavernous internal carotid arteries bilaterally. 4. Left ICA stenosis is less than 50%. 5. Higher grade right proximal ICA stenosis measuring 75-80% 6. High-grade stenosis at the origin of the dominant left vertebral artery. 7. Moderate stenoses of the right P2 segment of the posterior cerebral artery. 8. Atrophy of the submandibular glands bilaterally.   Micro Data:  COVID 19 negative  Antimicrobials:  CTX  Interim history/subjective:  NA  Objective   Blood pressure (!) 141/51, pulse 67, temperature 98.6 F (37 C), resp. rate 16, height 5\' 8"  (1.727 m),  weight 86.1 kg, SpO2 100 %.    Vent Mode: PRVC FiO2 (%):  [40 %-100 %] 40 % Set Rate:  [16 bmp] 16 bmp Vt Set:  [510 mL] 510 mL PEEP:  [5 cmH20] 5 cmH20 Plateau Pressure:  [12 CWC37-62 cmH20] 12 cmH20   Intake/Output Summary (Last 24 hours) at 01/05/2019 2356 Last data filed at 12/20/2018 2127 Gross per 24 hour  Intake -  Output 1950 ml  Net -1950 ml   Filed Weights   12/23/2018 0720  Weight: 86.1 kg    Examination: General: Patient intermittently with purposeful movements HENT: Patient with moist mucous membranes Lungs: Diminished breath sounds bilaterally Cardiovascular: Regular rate. Noted to be paced on monitor Abdomen: Soft non tender non distended Extremities: No gross deformities noted Neuro: Patient able to move left arm and leg to pain. Moves right to pain as well. Minimal movement of right arm to pain. Does not appear to consistently follow commands.  GU: Patient noted to have foley  Resolved Hospital Problem list   NA  Assessment & Plan:  This is a 85 yo female with history as noted above who presents for CVA. Not a candidate for any interventions at this time.    CVA-Noted to be to have right MCA infarct on CT. CTA as noted above. Not a candidate for decompression or surgical interventions in future per neurology. -This will likely be life limiting and thus palliative care should be involved earlier  Hypoxic respiratory failure 2/2 aspiration- -PRVC. Wean vent as tolerated -CTX for possible aspiration  DM2-Patient on metformin at home -ISS for  now  HTN-Holding BP meds for now. Allowing for permissive HTN PRN for BP greater than 220/110 -Hyrdalazine prn  Hypothyroidism- Continue home synthroid per tube  COPD?-Home meds include BREO-Will be difficult to administer for now. No PFT's in system. Patient with no signs of exacerbation at this time.  -Albuterol prn  HFrEF-Patient with EF of 35-40% and diffuse hypokinesis -Will restart BB and lisinopril once  patient out of permissive HTN window  H/o arrhythmia/sick sinus syndrome--status post Medtronic dual-chamber pacemaker placement -Noted   History of ACS -Continue home statin and aspirin  Dispo. Patient is at [facility]. Her care is dictated by APS. No one is quitte sure of why that is. APS contact that I was given is Dickey GaveBailee Sivley at [phone]. Of note patient was full code at her facility.    Best practice:  Diet: NPO for now Pain/Anxiety/Delirium protocol (if indicated):  VAP protocol (if indicated): HOB at 30 degrees DVT prophylaxis: Lovenox for now GI prophylaxis: Pepcid 20 mg BID for now. Glucose control: Q 4 hr and ISS Mobility: NA Code Status: Full for now Family Communication:  Disposition: TBD  Labs   CBC: Recent Labs  Lab 12/18/2018 0740  WBC 7.5  NEUTROABS 5.6  HGB 11.2*  HCT 35.7*  MCV 81.5  PLT 220    Basic Metabolic Panel: Recent Labs  Lab 12/23/2018 0740 12/21/2018 0741  NA 134*  --   K 3.7  --   CL 94*  --   CO2 29  --   GLUCOSE 254*  --   BUN 73*  --   CREATININE 1.16* 1.20*  CALCIUM 9.6  --    GFR: Estimated Creatinine Clearance: 40.1 mL/min (A) (by C-G formula based on SCr of 1.2 mg/dL (H)). Recent Labs  Lab 12/25/2018 0740  WBC 7.5    Liver Function Tests: Recent Labs  Lab 12/22/2018 0740  AST 20  ALT 12  ALKPHOS 99  BILITOT 0.6  PROT 7.9  ALBUMIN 3.7   No results for input(s): LIPASE, AMYLASE in the last 168 hours. No results for input(s): AMMONIA in the last 168 hours.  ABG    Component Value Date/Time   PHART 7.393 12/19/2018 1945   PCO2ART 48.5 (H) 12/25/2018 1945   PO2ART 410 (H) 12/23/2018 1945   HCO3 27.8 12/21/2018 1945   O2SAT 99.5 12/31/2018 1945     Coagulation Profile: Recent Labs  Lab 12/12/2018 0740  INR 1.1    Cardiac Enzymes: No results for input(s): CKTOTAL, CKMB, CKMBINDEX, TROPONINI in the last 168 hours.  HbA1C: HB A1C (BAYER DCA - WAIVED)  Date/Time Value Ref Range  Status  06/25/2018 11:48 AM 7.1 (H) <7.0 % Final    Comment:                                          Diabetic Adult            <7.0                                       Healthy Adult        4.3 - 5.7                                                           (  DCCT/NGSP) American Diabetes Association's Summary of Glycemic Recommendations for Adults with Diabetes: Hemoglobin A1c <7.0%. More stringent glycemic goals (A1c <6.0%) may further reduce complications at the cost of increased risk of hypoglycemia.   05/12/2018 12:48 PM 6.5 <7.0 % Final    Comment:                                          Diabetic Adult            <7.0                                       Healthy Adult        4.3 - 5.7                                                           (DCCT/NGSP) American Diabetes Association's Summary of Glycemic Recommendations for Adults with Diabetes: Hemoglobin A1c <7.0%. More stringent glycemic goals (A1c <6.0%) may further reduce complications at the cost of increased risk of hypoglycemia.     CBG: No results for input(s): GLUCAP in the last 168 hours.  Review of Systems:   Pertinent positives and negative per hpi  Past Medical History  She,  has a past medical history of ANXIETY, Arteriosclerotic cardiovascular disease (ASCVD), Carpal tunnel syndrome, Cerebrovascular disease, Closed left fibular fracture (11/30/11), COPD, Dementia (HCC), Diabetes mellitus, Diastolic CHF, chronic (HCC), Gastroesophageal reflux disease, HOH (hard of hearing), Hyperlipidemia, Hypertension, Hypothyroidism, OSTEOARTHRITIS, KNEES, BILATERAL, Pacemaker, Peripheral neuropathy, Sinoatrial node dysfunction (HCC) (2003), Sleep apnea, and VITAMIN B12 DEFICIENCY.   Surgical History    Past Surgical History:  Procedure Laterality Date  . ABDOMINAL HYSTERECTOMY    . BLADDER REPAIR    . CARDIAC PACEMAKER PLACEMENT  2003   Medtronic, dual-chamber  . CARPAL TUNNEL RELEASE    . CATARACT EXTRACTION W/PHACO  Left 08/09/2017   Procedure: CATARACT EXTRACTION PHACO AND INTRAOCULAR LENS PLACEMENT (IOC);  Surgeon: Fabio PierceWrzosek, James, MD;  Location: [facility];  Service: Ophthalmology;  Laterality: Left;  CDE: 6.26  . CHOLECYSTECTOMY    . COLONOSCOPY  2008  . HEMORRHOID SURGERY    . NASAL FRACTURE SURGERY    . THYROIDECTOMY  1980   Goiter  . TOTAL KNEE ARTHROPLASTY     Bilateral     Social History   reports that she has never smoked. She has never used smokeless tobacco. She reports that she does not drink alcohol or use drugs.   Family History   Her family history includes COPD in her father; Cancer in her brother, brother, daughter, sister, sister, sister, and son; Diabetes in her sister and son; Early death in her son; Heart attack (age of onset: 7064) in her father; Heart disease in her son; Heart failure in her mother.   Allergies Allergies  Allergen Reactions  . Penicillins     Has patient had a PCN reaction causing immediate rash, facial/tongue/throat swelling, SOB or lightheadedness with hypotension: Unknown Has patient had a PCN reaction causing severe rash involving mucus membranes or skin necrosis: Unknown Has patient had a PCN reaction that required hospitalization: Unknown Has patient  had a PCN reaction occurring within the last 10 years: No If all of the above answers are "NO", then may proceed with Cephalosporin use.      Home Medications  Prior to Admission medications   Medication Sig Start Date End Date Taking? Authorizing Provider  acetaminophen (TYLENOL) 325 MG tablet Take 2 tablets (650 mg total) by mouth every 6 (six) hours as needed for mild pain, moderate pain, fever or headache. 07/21/18  Yes Samuel JesterMcManus, Kathleen, DO  albuterol (PROVENTIL HFA;VENTOLIN HFA) 108 (90 Base) MCG/ACT inhaler Inhale 2 puffs into the lungs every 6 (six) hours as needed for wheezing or shortness of breath. 07/21/18  Yes Samuel JesterMcManus, Kathleen, DO  ALPRAZolam Prudy Feeler(XANAX) 0.5 MG tablet Take 1 tablet (0.5 mg total) by  mouth at bedtime as needed for anxiety. 07/21/18  Yes Samuel JesterMcManus, Kathleen, DO  aspirin EC 81 MG tablet Take 81 mg by mouth daily. Reported on 12/14/2015   Yes [provider]  carvedilol (COREG) 12.5 MG tablet Take 1 tablet (12.5 mg total) by mouth 2 (two) times daily with a meal. 07/21/18  Yes Samuel JesterMcManus, Kathleen, DO  fluticasone furoate-vilanterol (BREO ELLIPTA) 100-25 MCG/INH AEPB Inhale 1 puff into the lungs every morning. 07/21/18  Yes Samuel JesterMcManus, Kathleen, DO  gabapentin (NEURONTIN) 300 MG capsule Take 1 capsule (300 mg total) by mouth 3 (three) times daily for 15 days. 07/21/18 01/09/2019 Yes Samuel JesterMcManus, Kathleen, DO  levothyroxine (SYNTHROID, LEVOTHROID) 200 MCG tablet TAKE (1) TABLET DAILY BE- FORE BREAKFAST. Patient taking differently: Take 200 mcg by mouth daily before breakfast. TAKE (1) TABLET DAILY BE- FORE BREAKFAST. 05/13/18  Yes Hawks, Christy A, FNP  lisinopril (PRINIVIL,ZESTRIL) 20 MG tablet Take 1 tablet (20 mg total) by mouth daily. 07/21/18  Yes Samuel JesterMcManus, Kathleen, DO  loperamide (IMODIUM A-D) 2 MG tablet Take 1 tablet (2 mg total) by mouth 4 (four) times daily as needed for diarrhea or loose stools. 08/11/18  Yes Hawks, Christy A, FNP  metFORMIN (GLUCOPHAGE) 500 MG tablet Take 500 mg by mouth 2 (two) times daily with a meal.   Yes [provider]  nitroGLYCERIN (NITROSTAT) 0.3 MG SL tablet Place 0.3 mg under the tongue every 5 (five) minutes as needed for chest pain.   Yes [provider]  nystatin-triamcinolone (MYCOLOG II) cream Apply 1 application topically 3 (three) times daily.   Yes [provider]  omeprazole (PRILOSEC) 20 MG capsule Take 1 capsule (20 mg total) by mouth daily. 07/21/18  Yes Samuel JesterMcManus, Kathleen, DO  pravastatin (PRAVACHOL) 80 MG tablet Take 1 tablet (80 mg total) by mouth daily. 07/21/18  Yes Samuel JesterMcManus, Kathleen, DO  sertraline (ZOLOFT) 50 MG tablet Take 1 tablet (50 mg total) by mouth daily. 07/21/18  Yes Samuel JesterMcManus, Kathleen, DO  tamsulosin (FLOMAX)  0.4 MG CAPS capsule Take 1 capsule (0.4 mg total) by mouth daily after supper. 02/25/18  Yes Emokpae, Courage, MD  torsemide (DEMADEX) 20 MG tablet TAKE 2 TABLETS BY MOUTH AT 8am & 2pm Patient taking differently: Take 40 mg by mouth 2 (two) times daily. TAKE 2 TABLETS BY MOUTH AT 8am & 2pm 09/12/18  Yes Hawks, Christy A, FNP  traMADol (ULTRAM) 50 MG tablet Take 1-2 tablets (50-100 mg total) by mouth every 12 (twelve) hours as needed. Patient taking differently: Take 50 mg by mouth every 12 (twelve) hours as needed for severe pain.  06/25/18  Yes Hawks, Christy A, FNP  metFORMIN (GLUCOPHAGE) 1000 MG tablet Take 1 tablet (1,000 mg total) by mouth 2 (two) times daily. Patient not  taking: Reported on January 13, 2019 07/21/18   Samuel Jester, DO     Critical care time: 60 minutes

## 2019-01-03 NOTE — ED Notes (Signed)
Family at bedside. One visitor at a time rotating out.

## 2019-01-03 NOTE — ED Triage Notes (Signed)
Patient brought in by [facility] from [facility]. Last known well last night, staff noticed status change at 4 am this morning during med pass.  Patient appeared to be "gasping for breath".  Patient is lethargic with left gaze deviation, flaccid right arm.  CBG 247 per EMS.

## 2019-01-03 NOTE — Progress Notes (Signed)
[facility] Physician-Brief Progress Note Patient Name: CAILIE BOSSHART DOB: 04-Nov-1932 MRN: [id number]   Date of Service  12/21/2018  HPI/Events of Note  40 F with hx of HTN, Chronic diastlic CHF, SA dysfunction, PVD, OSA, COPD, DM, Hypothyroidism  accepted from [facility] for  Large left Acute MCA territory CVA, out of window for tPA. On the way from [facility]- aspirated and now on Ventilator. Hypertensive on propofol sedation  getting better.  Data: 7.39/48/410./27. CxR dual chamber PM, ET in position. NG going below diaphragm. 2019: EF 35 to 40%. LVH.PASP 42. CT on 7/25 as noted below:  IMPRESSION: 1. Large left MCA territory infarct. Core infarct volume is 92 mL. 2. Partially occlusive thrombus in the proximal left M1 and A1 segments. This may represent some reperfusion from earlier occlusion. 3. Atherosclerotic changes within the cavernous internal carotid arteries bilaterally. 4. Left ICA stenosis is less than 50%. 5. Higher grade right proximal ICA stenosis measuring 75-80% 6. High-grade stenosis at the origin of the dominant left vertebral artery. 7. Moderate stenoses of the right P2 segment of the posterior cerebral artery. 8. Atrophy of the submandibular glands bilaterally.  Video: In synchorny with vent. 510/10/25/38%. VS stable. Propofol sedation.   eICU Interventions  1. Large left MCA acute CVA, no tpa or candidate for mechanical thrombectomy as per neurology dr Ronalee Red note . - received asa/stattin. - CBG goal < 180 - avoid hypotension. Allow permissive HTN.  2. Aspirated /on Ventilator: received in [facility]  Vanc/clinda . Now on rocephin. - lung protective ventilation. Wean Vt as tolerated.  - VAP bundle. - follow CxR, ABG.   3. CHF/HTN - stable.  4. COPD. Seems stable. Not in exacerbation.  5. Hypothyroidism - resume meds .   On VTE prophylaxis.      Intervention Category Major Interventions: Respiratory failure - evaluation and management Intermediate Interventions:  Hypertension - evaluation and management Evaluation Type: New Patient Evaluation  Elmer Sow 01/06/2019, 11:50 PM

## 2019-01-03 NOTE — Progress Notes (Addendum)
85 year old female who was earlier admitted by Dr. Roderic Palau for large left MCA infarct with right hemiplegia/neglect.  Patient was supposed to get CT angiogram of head and neck and CT perfusion study, when patient returned from CT, she had vomited and aspirated became hypoxic and cyanotic in the fingertips.  Patient was intubated and started on antibiotics for aspiration pneumonia.  Initially plan was to go to [facility] 5 W. Discussed with Dr. Sabra Heck, who called intensivist at [facility].  Patient has been accepted under intensivist care and will be transferred tonight.  IV propofol for sedation has been ordered, patient became hypotensive in the ED with systolic blood pressure 702.  Hopefully with propofol blood pressure should improve.  Will treat for permissive hypertension for BP greater than 220/110 Start hydralazine 5 mg every 4 hours as needed.  We will start empiric vancomycin and clindamycin for acute hypoxic respiratory failure due to aspiration pneumonia.  Patient has allergy to penicillin.  Discussed with family members.  Critical care time spent 40 minutes.

## 2019-01-03 NOTE — ED Notes (Signed)
Dr Darrick Meigs has spoken with family members in family room regarding pt status and plan of care.

## 2019-01-03 NOTE — ED Notes (Signed)
Patient daughter Ms. Mancel Bale notified of status change.

## 2019-01-03 NOTE — ED Notes (Addendum)
Pt was able to squeeze my hand with her left hand when asked this time. Pt attempting to reach for tube with her left hand, mit applied to left hand to prevent pt from grabbing tube.

## 2019-01-03 NOTE — ED Notes (Addendum)
Pt came back from CT. 

## 2019-01-03 NOTE — Consult Note (Signed)
   [facility] Tele[facility]urology Consult Services  Stat Consult  Date of Service:   12/20/2018 08:28:05  Impression:     .  L MCA stroke  Comments/Sign-Out: Patient with a large left hemispheric stroke. HCT showed acute nature with ASPECT score of 3. No tpA was offered due to being outside of treatment window. No NIR intervention due to patient high comorbidity of mrS4 and large core infarct seen on HCT. WOuld recommend ASA and monitor.  CT HEAD: Reviewed Acute L MCA stroke with clot  Metrics: [facility] Notification Time: 12/20/2018 08:26:35 Stamp Time: 12/30/2018 08:28:05 Callback Response Time: 12/24/2018 09:26:38 Video Start Time: 12/15/2018 09:33:33 Video End Time: 12/13/2018 09:56:31  Our recommendations are outlined below.  Recommendations:     .  Initiate Aspirin 325 MG Daily   Imaging Studies:     .  MRI Head     .  MRA Head and Neck Without Contrast When Available - Stroke Protocol     .  Echocardiogram - Transthoracic Echocardiogram  Therapies:     .  Physical Therapy, Occupational Therapy, Speech Therapy Assessment When Applicable  Disposition: Neurology Follow Up Recommended  Sign Out:     .  Discussed with Emergency Department Provider  ----------------------------------------------------------------------------------------------------  Chief Complaint: Aphasia  History of Present Illness: Patient is a 85 year old Female.  PMH of HTN, PVD, CHF, COPD, GERD, Hypothyroidism presented with aphasia gaze and right sided weakness. Difficult to ascertain last known well, however around 0400 this morning per report by EMS she wasn't acting like herself and gasping for air. Per staff last known normal was 2100. Check her HR in 60s they started a few minutes of CPR but no known loss of pulse.   Examination: 1A: Level of Consciousness - Requires repeated stimulation to arouse + 2 1B: Ask Month and Age - Both Questions Right + 0 1C: Blink Eyes & Squeeze  Hands - Performs Both Tasks + 0 2: Test Horizontal Extraocular Movements - Forced Gaze Palsy: Cannot Be Overcome + 2 3: Test Visual Fields - Complete Hemianopia + 2 4: Test Facial Palsy (Use Grimace if Obtunded) - Normal symmetry + 0 5A: Test Left Arm Motor Drift - No Drift for 10 Seconds + 0 5B: Test Right Arm Motor Drift - No Effort Against Gravity + 3 6A: Test Left Leg Motor Drift - No Effort Against Gravity + 3 6B: Test Right Leg Motor Drift - No Movement + 4 7: Test Limb Ataxia (FNF/Heel-Shin) - No Ataxia + 0 8: Test Sensation - Normal; No sensory loss + 0 9: Test Language/Aphasia - Normal; No aphasia + 0 10: Test Dysarthria - Normal + 0 11: Test Extinction/Inattention - No abnormality + 0  NIHSS Score: 16     Dr Hinda Lenis Tabithia Stroder   [facility] [phone]  Case [id number]

## 2019-01-03 NOTE — ED Notes (Addendum)
Spoke with Glennie Hawk who is APS supervisor today and can be reached at [phone] with any questions regarding decision making for this patient. Raford Pitcher is this patient's social worker who cannot be reached until Monday. Fax number for DSS is [phone].

## 2019-01-03 NOTE — ED Notes (Signed)
[facility] at bedside loading up pt for transport.

## 2019-01-03 NOTE — H&P (Signed)
History and Physical    Allean Foundda M Cronin ONG:[id number] DOB: 1932/11/14 DOA: 25-Dec-2018  PCP: Junie SpencerHawks, Christy A, FNP  Patient coming from: [facility]  I have personally briefly reviewed patient's [facility]  Chief Complaint: Right-sided weakness  HPI: [name] [name] is a 85 y.o. female with medical history significant of chronic combined CHF, coronary artery disease, COPD, dementia, hypertension, diabetes who is a resident of an assisted living facility.  At baseline, per her daughter she is able to ambulate with a walker.  She was last known normal yesterday evening.  This morning, while passing her medications, staff noted that she was lethargic and had left-sided gaze.  She had flaccid right arm.  She was brought to the hospital for evaluation.  Patient is unable to provide any history at this time due to mental status.  ED Course: In the emergency room, she was noted to have a right-sided weakness and left-sided gaze.  CT head confirms H left MCA infarct.  Seen by tele-neurology who recommended admission for further work-up including CT angiogram of the head and neck, CT perfusion study and neurology consultation.  Review of Systems: Unable to assess due to mental status.    Past Medical History:  Diagnosis Date  . ANXIETY   . Arteriosclerotic cardiovascular disease (ASCVD)    Cath in 9/99-70% mid LAD with diffuse distal disease, 70% T1, 60% mid circumflex, 50% mid RCA, normal ejection fraction; negative stress nuclear in 07/2008  . Carpal tunnel syndrome   . Cerebrovascular disease    carotid bruits; no focal disease in 1999, 2002 and 2006  . Closed left fibular fracture 11/30/11  . COPD   . Dementia (HCC)   . Diabetes mellitus    Insulin treatment  . Diastolic CHF, chronic (HCC)    Normal EF  . Gastroesophageal reflux disease    With hiatal hernia; irritable bowel syndrome; H. pylori-treated; Colonoscopy 2008: non-specific  colitis, IH, Diverticulosis, Rectal ulcer secondary to ASA  . HOH (hard of hearing)   . Hyperlipidemia   . Hypertension   . Hypothyroidism   . OSTEOARTHRITIS, KNEES, BILATERAL    Bilateral TKA  . Pacemaker   . Peripheral neuropathy   . Sinoatrial node dysfunction (HCC) 2003   Medtronic dual-chamber device  . Sleep apnea    pt could not tolerate  . VITAMIN B12 DEFICIENCY     Past Surgical History:  Procedure Laterality Date  . ABDOMINAL HYSTERECTOMY    . BLADDER REPAIR    . CARDIAC PACEMAKER PLACEMENT  2003   Medtronic, dual-chamber  . CARPAL TUNNEL RELEASE    . CATARACT EXTRACTION W/PHACO Left 08/09/2017   Procedure: CATARACT EXTRACTION PHACO AND INTRAOCULAR LENS PLACEMENT (IOC);  Surgeon: Fabio PierceWrzosek, James, MD;  Location: [facility];  Service: Ophthalmology;  Laterality: Left;  CDE: 6.26  . CHOLECYSTECTOMY    . COLONOSCOPY  2008  . HEMORRHOID SURGERY    . NASAL FRACTURE SURGERY    . THYROIDECTOMY  1980   Goiter  . TOTAL KNEE ARTHROPLASTY     Bilateral    Social History:  reports that she has never smoked. She has never used smokeless tobacco. She reports that she does not drink alcohol or use drugs.  Allergies  Allergen Reactions  . Penicillins     Has patient had a PCN reaction causing immediate rash, facial/tongue/throat swelling, SOB or lightheadedness with hypotension: Unknown Has patient had a PCN reaction causing severe rash involving mucus membranes  or skin necrosis: Unknown Has patient had a PCN reaction that required hospitalization: Unknown Has patient had a PCN reaction occurring within the last 10 years: No If all of the above answers are "NO", then may proceed with Cephalosporin use.     Family History  Problem Relation Age of Onset  . COPD Father   . Heart attack Father 64  . Heart failure Mother   . Cancer Sister        colon  . Cancer Brother        lung  . Cancer Sister        bladder  . Cancer Brother        lung  . Early death Son   . Diabetes  Son   . Heart disease Son   . Cancer Son        prostate  . Cancer Sister        kidney cancer  . Diabetes Sister   . Cancer Daughter      Prior to Admission medications   Medication Sig Start Date End Date Taking? Authorizing Provider  acetaminophen (TYLENOL) 325 MG tablet Take 2 tablets (650 mg total) by mouth every 6 (six) hours as needed for mild pain, moderate pain, fever or headache. 07/21/18  Yes Francine Graven, DO  albuterol (PROVENTIL HFA;VENTOLIN HFA) 108 (90 Base) MCG/ACT inhaler Inhale 2 puffs into the lungs every 6 (six) hours as needed for wheezing or shortness of breath. 07/21/18  Yes Francine Graven, DO  ALPRAZolam Duanne Moron) 0.5 MG tablet Take 1 tablet (0.5 mg total) by mouth at bedtime as needed for anxiety. 07/21/18  Yes Francine Graven, DO  aspirin EC 81 MG tablet Take 81 mg by mouth daily. Reported on 12/14/2015   Yes [provider]  carvedilol (COREG) 12.5 MG tablet Take 1 tablet (12.5 mg total) by mouth 2 (two) times daily with a meal. 07/21/18  Yes Francine Graven, DO  fluticasone furoate-vilanterol (BREO ELLIPTA) 100-25 MCG/INH AEPB Inhale 1 puff into the lungs every morning. 07/21/18  Yes Francine Graven, DO  gabapentin (NEURONTIN) 300 MG capsule Take 1 capsule (300 mg total) by mouth 3 (three) times daily for 15 days. 07/21/18 2019/01/26 Yes Francine Graven, DO  levothyroxine (SYNTHROID, LEVOTHROID) 200 MCG tablet TAKE (1) TABLET DAILY BE- FORE BREAKFAST. Patient taking differently: Take 200 mcg by mouth daily before breakfast. TAKE (1) TABLET DAILY BE- FORE BREAKFAST. 05/13/18  Yes Hawks, Christy A, FNP  lisinopril (PRINIVIL,ZESTRIL) 20 MG tablet Take 1 tablet (20 mg total) by mouth daily. 07/21/18  Yes Francine Graven, DO  loperamide (IMODIUM A-D) 2 MG tablet Take 1 tablet (2 mg total) by mouth 4 (four) times daily as needed for diarrhea or loose stools. 08/11/18  Yes Hawks, Christy A, FNP  metFORMIN (GLUCOPHAGE) 500 MG tablet Take 500 mg by mouth 2 (two)  times daily with a meal.   Yes [provider]  nitroGLYCERIN (NITROSTAT) 0.3 MG SL tablet Place 0.3 mg under the tongue every 5 (five) minutes as needed for chest pain.   Yes [provider]  nystatin-triamcinolone (MYCOLOG II) cream Apply 1 application topically 3 (three) times daily.   Yes [provider]  omeprazole (PRILOSEC) 20 MG capsule Take 1 capsule (20 mg total) by mouth daily. 07/21/18  Yes Francine Graven, DO  pravastatin (PRAVACHOL) 80 MG tablet Take 1 tablet (80 mg total) by mouth daily. 07/21/18  Yes Francine Graven, DO  sertraline (ZOLOFT) 50 MG tablet Take 1 tablet (50 mg total)  by mouth daily. 07/21/18  Yes Samuel JesterMcManus, Kathleen, DO  tamsulosin (FLOMAX) 0.4 MG CAPS capsule Take 1 capsule (0.4 mg total) by mouth daily after supper. 02/25/18  Yes Emokpae, Courage, MD  torsemide (DEMADEX) 20 MG tablet TAKE 2 TABLETS BY MOUTH AT 8am & 2pm Patient taking differently: Take 40 mg by mouth 2 (two) times daily. TAKE 2 TABLETS BY MOUTH AT 8am & 2pm 09/12/18  Yes Hawks, Christy A, FNP  traMADol (ULTRAM) 50 MG tablet Take 1-2 tablets (50-100 mg total) by mouth every 12 (twelve) hours as needed. Patient taking differently: Take 50 mg by mouth every 12 (twelve) hours as needed for severe pain.  06/25/18  Yes Hawks, Christy A, FNP  metFORMIN (GLUCOPHAGE) 1000 MG tablet Take 1 tablet (1,000 mg total) by mouth 2 (two) times daily. Patient not taking: Reported on 10/09/18 07/21/18   Samuel JesterMcManus, Kathleen, DO    Physical Exam: Vitals:   06/09/2019 1230 06/09/2019 1245 06/09/2019 1300 06/09/2019 1330  BP: (!) 188/80  (!) 190/69 (!) 182/67  Pulse: 60 61 60 60  Resp: 16 15 15 16   Temp:      TempSrc:      SpO2: 100% 99% 99% 97%  Weight:      Height:        Constitutional: NAD, calm, comfortable Eyes: PERRL, lids and conjunctivae normal ENMT: Mucous membranes are moist. Posterior pharynx clear of any exudate or lesions.Normal dentition.  Neck: normal, supple, no masses, no  thyromegaly Respiratory: clear to auscultation bilaterally, no wheezing, no crackles. Normal respiratory effort. No accessory muscle use.  Cardiovascular: Regular rate and rhythm, no murmurs / rubs / gallops. No extremity edema. 2+ pedal pulses. No carotid bruits.  Abdomen: no tenderness, no masses palpated. No hepatosplenomegaly. Bowel sounds positive.  Musculoskeletal: no clubbing / cyanosis. No joint deformity upper and lower extremities. Good ROM, no contractures. Normal muscle tone.  Skin: no rashes, lesions, ulcers. No induration Neurologic: Dense right-sided hemiplegia, leftward gaze, right-sided facial droop, moving left upper and lower extremity spontaneously, but does not follow commands Psychiatric: Nonverbal   Labs on Admission: I have personally reviewed following labs and imaging studies  CBC: Recent Labs  Lab 06/09/2019 0740  WBC 7.5  NEUTROABS 5.6  HGB 11.2*  HCT 35.7*  MCV 81.5  PLT 220   Basic Metabolic Panel: Recent Labs  Lab 06/09/2019 0740 06/09/2019 0741  NA 134*  --   K 3.7  --   CL 94*  --   CO2 29  --   GLUCOSE 254*  --   BUN 73*  --   CREATININE 1.16* 1.20*  CALCIUM 9.6  --    GFR: Estimated Creatinine Clearance: 40.1 mL/min (A) (by C-G formula based on SCr of 1.2 mg/dL (H)). Liver Function Tests: Recent Labs  Lab 06/09/2019 0740  AST 20  ALT 12  ALKPHOS 99  BILITOT 0.6  PROT 7.9  ALBUMIN 3.7   No results for input(s): LIPASE, AMYLASE in the last 168 hours. No results for input(s): AMMONIA in the last 168 hours. Coagulation Profile: Recent Labs  Lab 06/09/2019 0740  INR 1.1   Cardiac Enzymes: No results for input(s): CKTOTAL, CKMB, CKMBINDEX, TROPONINI in the last 168 hours. BNP (last 3 results) No results for input(s): PROBNP in the last 8760 hours. HbA1C: No results for input(s): HGBA1C in the last 72 hours. CBG: No results for input(s): GLUCAP in the last 168 hours. Lipid Profile: No results for input(s): CHOL, HDL, LDLCALC, TRIG,  CHOLHDL, LDLDIRECT in  the last 72 hours. Thyroid Function Tests: No results for input(s): TSH, T4TOTAL, FREET4, T3FREE, THYROIDAB in the last 72 hours. Anemia Panel: No results for input(s): VITAMINB12, FOLATE, FERRITIN, TIBC, IRON, RETICCTPCT in the last 72 hours. Urine analysis:    Component Value Date/Time   COLORURINE YELLOW 09/08/2018 1221   APPEARANCEUR CLEAR 09/08/2018 1221   APPEARANCEUR Clear 06/25/2018 1111   LABSPEC 1.009 09/08/2018 1221   PHURINE 6.0 09/08/2018 1221   GLUCOSEU NEGATIVE 09/08/2018 1221   GLUCOSEU NEGATIVE 12/23/2012 1110   HGBUR NEGATIVE 09/08/2018 1221   HGBUR large 05/16/2010 0746   BILIRUBINUR NEGATIVE 09/08/2018 1221   BILIRUBINUR Negative 06/25/2018 1111   KETONESUR NEGATIVE 09/08/2018 1221   PROTEINUR NEGATIVE 09/08/2018 1221   UROBILINOGEN negative 07/05/2015 1431   UROBILINOGEN 0.2 12/23/2012 1110   NITRITE NEGATIVE 09/08/2018 1221   LEUKOCYTESUR NEGATIVE 09/08/2018 1221    Radiological Exams on Admission: Ct Head Wo Contrast  Result Date: 01/29/19 CLINICAL DATA:  Stroke suspected.  Left MCA syndrome EXAM: CT HEAD WITHOUT CONTRAST TECHNIQUE: Contiguous axial images were obtained from the base of the skull through the vertex without intravenous contrast. COMPARISON:  07/20/2018 FINDINGS: Brain: Cytotoxic edema throughout the cortex of the left MCA territory, sparing the basal ganglia and internal capsule. No hemorrhage, hydrocephalus, or collection. Pre-existing lacune in the left centrum semiovale. Vascular: Hyperdense left M1 and A1 segments. No ACA territory ischemia seen. Skull: Negative Sinuses/Orbits: No acute finding.  Left cataract resection. Other: These results were called by telephone at the time of interpretation on 01-29-2019 at 8:00 am to Dr. Donnetta Hutching , who verbally acknowledged these results. Patient is in renal failure, acute indeterminate, and given the extent of infarct will wait on neurology input on timing of CTA and perfusion  imaging which has been ordered as well. IMPRESSION: 1. Large acute left MCA territory infarct.ASPECTS is 3. 2. Hyperdense left M1 and A1 segments.  No evidence of ACA ischemia. 3. No hemorrhagic transformation. Electronically Signed   By: Marnee Spring M.D.   On: 01/29/2019 08:05   Dg Chest Port 1 View  Result Date: 01-29-19 CLINICAL DATA:  Possible aspiration EXAM: PORTABLE CHEST 1 VIEW COMPARISON:  09/08/2018 FINDINGS: Cardiomegaly and LEFT-sided pacemaker again noted. This is a mildly low volume film. There is no evidence of focal airspace disease, pulmonary edema, suspicious pulmonary nodule/mass, pleural effusion, or pneumothorax. No acute bony abnormalities are identified. IMPRESSION: Cardiomegaly without evidence of acute cardiopulmonary disease. Electronically Signed   By: Harmon Pier M.D.   On: 2019-01-29 11:14    EKG: Independently reviewed. Paced rhythm  Assessment/Plan Active Problems:   Hypothyroid   Hypertension associated with diabetes (HCC)   Chronic obstructive pulmonary disease (HCC)   PPM-Medtronic   Type II diabetes mellitus with peripheral autonomic neuropathy (HCC)   Chronic combined systolic (congestive) and diastolic (congestive) heart failure (HCC)   Acute ischemic left MCA stroke (HCC)   Hemi-neglect of right side     1. Large left MCA infarct with right hemiplegia/neglect.  Patient is chronically on baby aspirin.  This will be increased to full dose aspirin.  She will be kept n.p.o. until swallowing can be assessed.  CT angiogram of the head and neck and CT perfusion study have been ordered.  Echocardiogram has also been recommended.  She is not a candidate for MRI due to presence of pacemaker.  Will request stroke service to follow for further recommendations and prognosis.  Physical therapy, Occupational Therapy consultations.  Check A1c and lipid panel.  2. Chronic combined CHF.  Ejection fraction of 35 to 40%.  Appears compensated at this time.  Hold further  diuretics. 3. Increased BUN.  Likely related to diuretics.  No signs of GI bleeding.  Holding diuretics at this time provide gentle hydration. 4. Hypertension.  Will hold antihypertensive medications also have permissive hypertension for the next 24 to 48 hours. 5. Diabetes.  Hold metformin.  Keep n.p.o.  Start on sliding scale insulin. 6. Hypothyroidism.  Restart Synthroid once able to take p.o. 7. COPD.  Continue inhalers.  No shortness of breath or wheezing at this time.  DVT prophylaxis:  heparin Code Status: full code  Family Communication: discussed with daughter at bedside.  Informed [name] [name] with Adult Protective Services who is patient's gaurdian [facility]  Consults called:   Admission status: inpatient, telemetry   Erick BlinksJehanzeb Kamela Blansett MD [facility]   If 7PM-7AM, please contact night-coverage [email or web address]   12/18/2018, 2:04 PM

## 2019-01-03 NOTE — Progress Notes (Signed)
Pharmacy Antibiotic Note  [name] [name] is a 85 y.o. female admitted on 01/02/2019 with pneumonia.  Pharmacy has been consulted for Vancomycin dosing.  Plan: Vancomycin 1500mg  loading dose, then 1250mg   IV every 24 hours.  Goal trough 15-20 mcg/mL.  Also on Clindamycin 600mg  IV q8h F/U cxs and clinical progress Monitor V/S, labs, and levels as indicated  Height: 5\' 8"  (172.7 cm) Weight: 189 lb 13.1 oz (86.1 kg) IBW/kg (Calculated) : 63.9  Temp (24hrs), Avg:98.3 F (36.8 C), Min:97.8 F (36.6 C), Max:98.6 F (37 C)  Recent Labs  Lab 01/06/2019 0740 12/15/2018 0741  WBC 7.5  --   CREATININE 1.16* 1.20*    Estimated Creatinine Clearance: 40.1 mL/min (A) (by C-G formula based on SCr of 1.2 mg/dL (H)).    Allergies  Allergen Reactions  . Penicillins     Has patient had a PCN reaction causing immediate rash, facial/tongue/throat swelling, SOB or lightheadedness with hypotension: Unknown Has patient had a PCN reaction causing severe rash involving mucus membranes or skin necrosis: Unknown Has patient had a PCN reaction that required hospitalization: Unknown Has patient had a PCN reaction occurring within the last 10 years: No If all of the above answers are "NO", then may proceed with Cephalosporin use.     Antimicrobials this admission: Vancomycin 7/25 >>  Clindamycin 7/25>>   Dose adjustments this admission: n/a  Microbiology results:  BCx:   MRSA PCR:   Thank you for allowing pharmacy to be a part of this patient's care.  Isac Sarna, BS [place], [place] Clinical Pharmacist Pager [phone] 01/02/2019 9:55 PM

## 2019-01-04 ENCOUNTER — Inpatient Hospital Stay (HOSPITAL_COMMUNITY): Payer: Medicare HMO

## 2019-01-04 DIAGNOSIS — Z9911 Dependence on respirator [ventilator] status: Secondary | ICD-10-CM

## 2019-01-04 DIAGNOSIS — I63512 Cerebral infarction due to unspecified occlusion or stenosis of left middle cerebral artery: Secondary | ICD-10-CM

## 2019-01-04 DIAGNOSIS — I340 Nonrheumatic mitral (valve) insufficiency: Secondary | ICD-10-CM

## 2019-01-04 DIAGNOSIS — I5042 Chronic combined systolic (congestive) and diastolic (congestive) heart failure: Secondary | ICD-10-CM

## 2019-01-04 DIAGNOSIS — I639 Cerebral infarction, unspecified: Secondary | ICD-10-CM | POA: Diagnosis present

## 2019-01-04 DIAGNOSIS — E1143 Type 2 diabetes mellitus with diabetic autonomic (poly)neuropathy: Secondary | ICD-10-CM

## 2019-01-04 DIAGNOSIS — I63412 Cerebral infarction due to embolism of left middle cerebral artery: Secondary | ICD-10-CM

## 2019-01-04 LAB — SODIUM
Sodium: 144 mmol/L (ref 135–145)
Sodium: 148 mmol/L — ABNORMAL HIGH (ref 135–145)
Sodium: 153 mmol/L — ABNORMAL HIGH (ref 135–145)

## 2019-01-04 LAB — BASIC METABOLIC PANEL
Anion gap: 17 — ABNORMAL HIGH (ref 5–15)
BUN: 63 mg/dL — ABNORMAL HIGH (ref 8–23)
CO2: 24 mmol/L (ref 22–32)
Calcium: 9.7 mg/dL (ref 8.9–10.3)
Chloride: 99 mmol/L (ref 98–111)
Creatinine, Ser: 1.13 mg/dL — ABNORMAL HIGH (ref 0.44–1.00)
GFR calc Af Amer: 52 mL/min — ABNORMAL LOW (ref >60)
GFR calc non Af Amer: 45 mL/min — ABNORMAL LOW (ref >60)
Glucose, Bld: 276 mg/dL — ABNORMAL HIGH (ref 70–99)
Potassium: 3.3 mmol/L — ABNORMAL LOW (ref 3.5–5.1)
Sodium: 140 mmol/L (ref 135–145)

## 2019-01-04 LAB — GLUCOSE, CAPILLARY
Glucose-Capillary: 121 mg/dL — ABNORMAL HIGH (ref 70–99)
Glucose-Capillary: 154 mg/dL — ABNORMAL HIGH (ref 70–99)
Glucose-Capillary: 156 mg/dL — ABNORMAL HIGH (ref 70–99)
Glucose-Capillary: 162 mg/dL — ABNORMAL HIGH (ref 70–99)
Glucose-Capillary: 216 mg/dL — ABNORMAL HIGH (ref 70–99)
Glucose-Capillary: 237 mg/dL — ABNORMAL HIGH (ref 70–99)
Glucose-Capillary: 277 mg/dL — ABNORMAL HIGH (ref 70–99)

## 2019-01-04 LAB — PHOSPHORUS: Phosphorus: 4.7 mg/dL — ABNORMAL HIGH (ref 2.5–4.6)

## 2019-01-04 LAB — CBC
HCT: 34.7 % — ABNORMAL LOW (ref 36.0–46.0)
Hemoglobin: 10.8 g/dL — ABNORMAL LOW (ref 12.0–15.0)
MCH: 25.1 pg — ABNORMAL LOW (ref 26.0–34.0)
MCHC: 31.1 g/dL (ref 30.0–36.0)
MCV: 80.7 fL (ref 80.0–100.0)
Platelets: 237 10*3/uL (ref 150–400)
RBC: 4.3 MIL/uL (ref 3.87–5.11)
RDW: 15.1 % (ref 11.5–15.5)
WBC: 9.2 10*3/uL (ref 4.0–10.5)
nRBC: 0 % (ref 0.0–0.2)

## 2019-01-04 LAB — LIPID PANEL
Cholesterol: 144 mg/dL (ref 0–200)
HDL: 63 mg/dL (ref >40)
LDL Cholesterol: 67 mg/dL (ref 0–99)
Total CHOL/HDL Ratio: 2.3 RATIO
Triglycerides: 69 mg/dL (ref <150)
VLDL: 14 mg/dL (ref 0–40)

## 2019-01-04 LAB — HEMOGLOBIN A1C
Hgb A1c MFr Bld: 8.3 % — ABNORMAL HIGH (ref 4.8–5.6)
Mean Plasma Glucose: 191.51 mg/dL

## 2019-01-04 LAB — MRSA PCR SCREENING: MRSA by PCR: POSITIVE — AB

## 2019-01-04 LAB — MAGNESIUM: Magnesium: 1.9 mg/dL (ref 1.7–2.4)

## 2019-01-04 MED ORDER — CHLORHEXIDINE GLUCONATE CLOTH 2 % EX PADS
6.0000 | MEDICATED_PAD | Freq: Every day | CUTANEOUS | Status: DC
  Administered 2019-01-04 – 2019-01-06 (×3): 6 via TOPICAL

## 2019-01-04 MED ORDER — SODIUM CHLORIDE 3 % IV SOLN
INTRAVENOUS | Status: DC
  Administered 2019-01-04 (×2): 75 mL/h via INTRAVENOUS
  Filled 2019-01-04 (×5): qty 500

## 2019-01-04 MED ORDER — ONDANSETRON HCL 4 MG/2ML IJ SOLN: 4.0000 mg | Freq: Four times a day (QID) | INTRAMUSCULAR | Status: DC | PRN

## 2019-01-04 MED ORDER — PRAVASTATIN SODIUM 40 MG PO TABS
80.0000 mg | ORAL_TABLET | Freq: Every day | ORAL | Status: DC
  Administered 2019-01-04: 80 mg via ORAL
  Filled 2019-01-04: qty 2

## 2019-01-04 MED ORDER — PANTOPRAZOLE SODIUM 40 MG IV SOLR
40.0000 mg | Freq: Every day | INTRAVENOUS | Status: DC
  Administered 2019-01-04 – 2019-01-05 (×3): 40 mg via INTRAVENOUS
  Filled 2019-01-04 (×3): qty 40

## 2019-01-04 MED ORDER — LACTATED RINGERS IV SOLN: INTRAVENOUS | Status: DC

## 2019-01-04 MED ORDER — FENTANYL CITRATE (PF) 100 MCG/2ML IJ SOLN: 25.0000 ug | INTRAMUSCULAR | Status: DC | PRN

## 2019-01-04 MED ORDER — LEVOTHYROXINE SODIUM 100 MCG PO TABS
200.0000 ug | ORAL_TABLET | Freq: Every day | ORAL | Status: DC
  Administered 2019-01-04 – 2019-01-05 (×2): 200 ug
  Filled 2019-01-04 (×2): qty 2

## 2019-01-04 MED ORDER — ORAL CARE MOUTH RINSE
15.0000 mL | OROMUCOSAL | Status: DC
  Administered 2019-01-04 – 2019-01-06 (×27): 15 mL via OROMUCOSAL

## 2019-01-04 MED ORDER — LEVOTHYROXINE SODIUM 100 MCG PO TABS: 200.0000 ug | ORAL_TABLET | Freq: Every day | ORAL | Status: DC

## 2019-01-04 MED ORDER — MUPIROCIN 2 % EX OINT
1.0000 "application " | TOPICAL_OINTMENT | Freq: Two times a day (BID) | CUTANEOUS | Status: DC
  Administered 2019-01-04 – 2019-01-06 (×5): 1 via NASAL
  Filled 2019-01-04 (×2): qty 22

## 2019-01-04 MED ORDER — CHLORHEXIDINE GLUCONATE 0.12% ORAL RINSE (MEDLINE KIT)
15.0000 mL | Freq: Two times a day (BID) | OROMUCOSAL | Status: DC
  Administered 2019-01-04 – 2019-01-06 (×7): 15 mL via OROMUCOSAL

## 2019-01-04 MED ORDER — ACETAMINOPHEN 325 MG PO TABS: 650.0000 mg | ORAL_TABLET | ORAL | Status: DC | PRN

## 2019-01-04 MED ORDER — FAMOTIDINE IN NACL 20-0.9 MG/50ML-% IV SOLN
20.0000 mg | INTRAVENOUS | Status: DC
  Administered 2019-01-04 – 2019-01-05 (×2): 20 mg via INTRAVENOUS
  Filled 2019-01-04 (×2): qty 50

## 2019-01-04 MED ORDER — SODIUM CHLORIDE 0.9 % IV SOLN
1.0000 g | INTRAVENOUS | Status: DC
  Administered 2019-01-04 – 2019-01-06 (×3): 1 g via INTRAVENOUS
  Filled 2019-01-04 (×3): qty 1

## 2019-01-04 MED ORDER — PERFLUTREN LIPID MICROSPHERE
INTRAVENOUS | Status: AC
  Administered 2019-01-04: 3 mL
  Filled 2019-01-04: qty 10

## 2019-01-04 MED ORDER — FENTANYL CITRATE (PF) 100 MCG/2ML IJ SOLN
25.0000 ug | INTRAMUSCULAR | Status: DC | PRN
  Administered 2019-01-05: 100 ug via INTRAVENOUS
  Filled 2019-01-04: qty 2

## 2019-01-04 MED ORDER — POTASSIUM CHLORIDE 10 MEQ/100ML IV SOLN
10.0000 meq | INTRAVENOUS | Status: AC
  Administered 2019-01-04 (×2): 10 meq via INTRAVENOUS
  Filled 2019-01-04 (×2): qty 100

## 2019-01-04 MED ORDER — PERFLUTREN LIPID MICROSPHERE
1.0000 mL | INTRAVENOUS | Status: AC | PRN
  Filled 2019-01-04: qty 10

## 2019-01-04 MED ORDER — INSULIN ASPART 100 UNIT/ML ~~LOC~~ SOLN
0.0000 [IU] | SUBCUTANEOUS | Status: DC
  Administered 2019-01-04 (×3): 3 [IU] via SUBCUTANEOUS
  Administered 2019-01-04 (×2): 5 [IU] via SUBCUTANEOUS
  Administered 2019-01-04: 8 [IU] via SUBCUTANEOUS
  Administered 2019-01-04 – 2019-01-05 (×2): 2 [IU] via SUBCUTANEOUS
  Administered 2019-01-05: 5 [IU] via SUBCUTANEOUS
  Administered 2019-01-05 (×2): 3 [IU] via SUBCUTANEOUS
  Administered 2019-01-05: 5 [IU] via SUBCUTANEOUS
  Administered 2019-01-06 (×3): 3 [IU] via SUBCUTANEOUS

## 2019-01-04 MED ORDER — ALBUTEROL SULFATE (2.5 MG/3ML) 0.083% IN NEBU: 2.5000 mg | INHALATION_SOLUTION | Freq: Four times a day (QID) | RESPIRATORY_TRACT | Status: DC | PRN

## 2019-01-04 MED ORDER — ENOXAPARIN SODIUM 40 MG/0.4ML ~~LOC~~ SOLN
40.0000 mg | SUBCUTANEOUS | Status: DC
  Administered 2019-01-04 – 2019-01-06 (×3): 40 mg via SUBCUTANEOUS
  Filled 2019-01-04 (×3): qty 0.4

## 2019-01-04 NOTE — Progress Notes (Addendum)
NAME:  [name] [name], MRN:  [id number], DOB:  [birth year], LOS: 1 ADMISSION DATE:  12/25/2018, CONSULTATION DATE:  01/04/19 CHIEF COMPLAINT:  CVA   Brief History   This is a 84 yo female with history of chronic CHF, CAD, COPD, Dementia, HTN, and DM2. Patient apparently lives at assisted living facility. Here for further management of CVA and hypoxic respiratory failure.   History of present illness   This is a 85 yo female with history of chronic CHF, CAD, COPD, Dementia, HTN, and DM2. Patient apparently lives at assited living facility. She can ambulate at baseline per daughter. Staff noted patient to be more lethargic and flacid right arm. Brought into ED for stroke. Got CT head that confirmed Right MCA infarct. Got CT angiogram as well. Apparently while on way to CT patient aspirated. Had respiratory failure with sats around 65% and got intubated  Past Medical History  CHF HTN CAD COPD DM2 Dementia  Significant Hospital Events   Patient intubated on 01/04/2019  Consults:  Neurology  Procedures:  Intubation  Significant Diagnostic Tests:  CT on 7/25 as noted below:  IMPRESSION: 1. Large left MCA territory infarct.  Core infarct volume is 92 mL. 2. Partially occlusive thrombus in the proximal left M1 and A1 segments. This may represent some reperfusion from earlier occlusion. 3. Atherosclerotic changes within the cavernous internal carotid arteries bilaterally. 4. Left ICA stenosis is less than 50%. 5. Higher grade right proximal ICA stenosis measuring 75-80% 6. High-grade stenosis at the origin of the dominant left vertebral artery. 7. Moderate stenoses of the right P2 segment of the posterior cerebral artery. 8. Atrophy of the submandibular glands bilaterally.   Micro Data:  COVID 19 negative  Antimicrobials:  CTX  Interim history/subjective:  No change overnight.  Permissive HTN with PRN hydralazine orders - have not needed as of yet. Comfortable on vent.  Off  sedation since this am.   Objective   Blood pressure (!) 208/63, pulse 60, temperature 99.3 F (37.4 C), resp. rate 14, height 5\' 8"  (1.727 m), weight 82.8 kg, SpO2 100 %.    Vent Mode: PRVC FiO2 (%):  [40 %-100 %] 40 % Set Rate:  [16 bmp] 16 bmp Vt Set:  [510 mL] 510 mL PEEP:  [5 cmH20] 5 cmH20 Plateau Pressure:  [12 cmH20-20 cmH20] 15 cmH20   Intake/Output Summary (Last 24 hours) at 01/04/2019 1224 Last data filed at 01/04/2019 1100 Gross per 24 hour  Intake 793.62 ml  Output 2450 ml  Net -1656.38 ml   Filed Weights   12/13/2018 0720 01/04/19 0000 01/04/19 0343  Weight: 86.1 kg 82.8 kg 82.8 kg    Examination: General: chronically ill appearing female, NAD on vent  HENT: mm moist, no JVD  Lungs: resps even non labored on vent, essentially clear  Cardiovascular: Regular rate. Noted to be paced on monitor Abdomen: Soft non tender non distended Extremities: No gross deformities noted Neuro: withdraws to pain L side, R sided weakness. Not following commands  GU: Patient noted to have foley  Resolved Hospital Problem list   NA  Assessment & Plan:   Left MCA CVA- Not a candidate for decompression or surgical interventions in future per neurology. PLAN -  Neuro following  ASA  Echo pending  Poor prognosis overall  Need palliative care involvement  Difficult family situation -- APS listed as decision maker but multiple family members have called to check on her - awaiting APS to open 7/27 for discussions and further clarification  Hypoxic respiratory failure 2/2 aspiration- PLAN -  Vent support - 8cc/kg  F/u CXR  F/u ABG Daily SBT  No extubation unless mental status improves   DM2-Patient on metformin at home SSI   HTN-Holding BP meds for now. Allowing for permissive HTN PRN for BP greater than 220/110 PRN hydralazine   Hypothyroidism- Continue home synthroid per tube  COPD?-Home meds include BREO-Will be difficult to administer for now. No PFT's in system.  Patient with no signs of exacerbation at this time.  -Albuterol prn  HFrEF-Patient with EF of 35-40% and diffuse hypokinesis -Will restart BB and lisinopril once patient out of permissive HTN window  H/o arrhythmia/sick sinus syndrome--status post Medtronic dual-chamber pacemaker placement -Noted   History of ACS -Continue home statin and aspirin  Dispo. Patient is at [facility]. Her care is dictated by APS. No one is quitte sure of why that is. APS contact that I was given is Dickey GaveBailee Sivley at [phone] ext [phone]. Of note patient was full code at her facility.    Best practice:  Diet: NPO for now Pain/Anxiety/Delirium protocol (if indicated):  VAP protocol (if indicated): HOB at 30 degrees DVT prophylaxis: Lovenox for now GI prophylaxis: Pepcid 20 mg BID for now. Glucose control: Q 4 hr and ISS Mobility: NA Code Status: Full for now Family Communication:  Disposition: TBD  Labs   CBC: Recent Labs  Lab 01/09/2019 0740 01/04/19 0210  WBC 7.5 9.2  NEUTROABS 5.6  --   HGB 11.2* 10.8*  HCT 35.7* 34.7*  MCV 81.5 80.7  PLT 220 237    Basic Metabolic Panel: Recent Labs  Lab 12/24/2018 0740 01/05/2019 0741 01/04/19 0210 01/04/19 0808  NA 134*  --  140 144  K 3.7  --  3.3*  --   CL 94*  --  99  --   CO2 29  --  24  --   GLUCOSE 254*  --  276*  --   BUN 73*  --  63*  --   CREATININE 1.16* 1.20* 1.13*  --   CALCIUM 9.6  --  9.7  --   MG  --   --  1.9  --   PHOS  --   --  4.7*  --    GFR: Estimated Creatinine Clearance: 41.8 mL/min (A) (by C-G formula based on SCr of 1.13 mg/dL (H)). Recent Labs  Lab 12/16/2018 0740 01/04/19 0210  WBC 7.5 9.2    Liver Function Tests: Recent Labs  Lab 01/02/2019 0740  AST 20  ALT 12  ALKPHOS 99  BILITOT 0.6  PROT 7.9  ALBUMIN 3.7   No results for input(s): LIPASE, AMYLASE in the last 168 hours. No results for input(s): AMMONIA in the last 168 hours.  ABG    Component Value Date/Time   PHART 7.393  12/30/2018 1945   PCO2ART 48.5 (H) 12/30/2018 1945   PO2ART 410 (H) 12/24/2018 1945   HCO3 27.8 01/01/2019 1945   O2SAT 99.5 12/13/2018 1945     Coagulation Profile: Recent Labs  Lab 12/12/2018 0740  INR 1.1    Cardiac Enzymes: No results for input(s): CKTOTAL, CKMB, CKMBINDEX, TROPONINI in the last 168 hours.  HbA1C: HB A1C (BAYER DCA - WAIVED)  Date/Time Value Ref Range Status  06/25/2018 11:48 AM 7.1 (H) <7.0 % Final    Comment:  Diabetic Adult            <7.0                                       Healthy Adult        4.3 - 5.7                                                           (DCCT/NGSP) American Diabetes Association's Summary of Glycemic Recommendations for Adults with Diabetes: Hemoglobin A1c <7.0%. More stringent glycemic goals (A1c <6.0%) may further reduce complications at the cost of increased risk of hypoglycemia.   05/12/2018 12:48 PM 6.5 <7.0 % Final    Comment:                                          Diabetic Adult            <7.0                                       Healthy Adult        4.3 - 5.7                                                           (DCCT/NGSP) American Diabetes Association's Summary of Glycemic Recommendations for Adults with Diabetes: Hemoglobin A1c <7.0%. More stringent glycemic goals (A1c <6.0%) may further reduce complications at the cost of increased risk of hypoglycemia.    Hgb A1c MFr Bld  Date/Time Value Ref Range Status  01/04/2019 02:10 AM 8.3 (H) 4.8 - 5.6 % Final    Comment:    (NOTE) Pre diabetes:          5.7%-6.4% Diabetes:              >6.4% Glycemic control for   <7.0% adults with diabetes     CBG: Recent Labs  Lab 01/04/19 0025 01/04/19 0331 01/04/19 0823 01/04/19 1150  GLUCAP 277* 237* 121* 162*     Critical care time: 32 minutes    Dirk DressKaty Whiteheart, NP 01/04/2019  12:24 PM Pager: [phone] or [phone]    PCCM:  This is an  85 year old female admitted status post left MCA CVA not a not a candidate for intervention per neurology.  Patient was intubated placed on mechanical life support secondary inability protect airway.  Likely has aspiration event.  Due to inability protect airway.  Patient remains intubated on mechanical life support in intensive care unit.  BP (!) 191/65   Pulse 62   Temp 99.5 F (37.5 C)   Resp 15   Ht 5\' 8"  (1.727 m)   Wt 82.8 kg   SpO2 100%   BMI 27.76 kg/m   General: Elderly female, intubated on S port Heart: Regular rhythm, S1-S2 Lungs: Bilateral vented breath sounds  Neck: Trachea midline Abdomen: Soft nontender nondistended Neuro: Right-sided hemiplegia, left movement to spontaneous movements of the upper extremity lower extremity.,  Will withdraw to pain on the left side not withdrawal in the right  Labs reviewed Imaging reviewed Head CT with increase cytotoxic edema over the left MCA distribution  Assessment: Left MCA CVA, cytotoxic edema Acute hypoxemic respiratory failure requiring intubation mechanical ventilation Hypertension Diabetes Chronic systolic heart failure, ejection fraction 35 to 40%  Plan: Remains on full mechanical life support Admitted to the intensive care unit for close hemodynamic respiratory support. PRVC is 8 cc/kg Daily SBT as tolerated. Mental status precludes extubation at this time. PRN hydralazine progressive hypertension goal blood pressure per neurology SSI Restart beta-blocker and lisinopril once able to for heart failure regimen. Agree with plans that have already been set forth for palliative care discussions. Night team has already consulted palliative care.   This patient is critically ill with multiple organ system failure; which, requires frequent high complexity decision making, assessment, support, evaluation, and titration of therapies. This was completed through the application of advanced monitoring technologies and  extensive interpretation of multiple databases. During this encounter critical care time was devoted to patient care services described in this note for 32 minutes.   Josephine IgoBradley L Lyriq Jarchow, DO [facility] 01/04/2019 2:46 PM  Personal pager: [phone] If unanswered, please page CCM On-call: #[phone]

## 2019-01-04 NOTE — Progress Notes (Signed)
SLP Cancellation Note  Patient Details Name: [name] [name] MRN: [id number] DOB: 23-May-1933   Cancelled treatment:       Reason Eval/Treat Not Completed: Medical issues which prohibited therapy. Vented and sedated. Will follow.    Houston Siren 01/04/2019, 9:30 AM  Orbie Pyo Colvin Caroli.Ed Risk analyst [phone]; weekend [phone] Office [phone]

## 2019-01-04 NOTE — Progress Notes (Signed)
  Echocardiogram 2D Echocardiogram has been performed.  [name] [name] 01/04/2019, 5:14 PM

## 2019-01-04 NOTE — Progress Notes (Signed)
Pt transported from 4N 25 to CT and back without any complications. Pt respiratory status stable at this time on vent. RT will continue to monitor.

## 2019-01-04 NOTE — Progress Notes (Addendum)
STROKE TEAM PROGRESS NOTE   SUBJECTIVE (INTERVAL HISTORY) Her RN is at the bedside.  Pt not following commands, or make eye contact. Still intubated with left gaze preference, right side flaccid. Contacted APS, will do DNR in am. Will have palliative care on board.   OBJECTIVE Vitals:   01/04/19 0345 01/04/19 0400 01/04/19 0500 01/04/19 0600  BP: (!) 158/62 (!) 156/59 (!) 173/64 129/61  Pulse: 60 (!) 59 60 (!) 59  Resp: 16 17 14 12   Temp:  98.8 F (37.1 C) 98.6 F (37 C) 99.1 F (37.3 C)  TempSrc:      SpO2: 100% 100% 100% 100%  Weight:      Height:        CBC:  Recent Labs  Lab 11-Apr-2019 0740 01/04/19 0210  WBC 7.5 9.2  NEUTROABS 5.6  --   HGB 11.2* 10.8*  HCT 35.7* 34.7*  MCV 81.5 80.7  PLT 220 237    Basic Metabolic Panel:  Recent Labs  Lab 11-Apr-2019 0740 11-Apr-2019 0741 01/04/19 0210  NA 134*  --  140  K 3.7  --  3.3*  CL 94*  --  99  CO2 29  --  24  GLUCOSE 254*  --  276*  BUN 73*  --  63*  CREATININE 1.16* 1.20* 1.13*  CALCIUM 9.6  --  9.7  MG  --   --  1.9  PHOS  --   --  4.7*    Lipid Panel:     Component Value Date/Time   CHOL 144 01/04/2019 0210   CHOL 163 08/11/2018 1332   TRIG 69 01/04/2019 0210   TRIG 120 02/20/2013 1310   TRIG 101 12/11/2009   HDL 63 01/04/2019 0210   HDL 73 08/11/2018 1332   HDL 65 02/20/2013 1310   CHOLHDL 2.3 01/04/2019 0210   VLDL 14 01/04/2019 0210   LDLCALC 67 01/04/2019 0210   LDLCALC 69 08/11/2018 1332   LDLCALC 69 02/20/2013 1310   HgbA1c:  Lab Results  Component Value Date   HGBA1C 8.3 (H) 01/04/2019   Urine Drug Screen: No results found for: LABOPIA, COCAINSCRNUR, LABBENZ, AMPHETMU, THCU, LABBARB  Alcohol Level No results found for: ETH  IMAGING  Ct Angio Head W Or Wo Contrast Ct Angio Neck W And/or Wo Contrast Ct Cerebral Perfusion W Contrast  2018-10-01 IMPRESSION:  1. Large left MCA territory infarct. Core infarct volume is 92 mL.  2. Partially occlusive thrombus in the proximal left M1 and A1  segments. This may represent some reperfusion from earlier occlusion.  3. Atherosclerotic changes within the cavernous internal carotid arteries bilaterally.  4. Left ICA stenosis is less than 50%.  5. Higher grade right proximal ICA stenosis measuring 75-80%  6. High-grade stenosis at the origin of the dominant left vertebral artery.  7. Moderate stenoses of the right P2 segment of the posterior cerebral artery.  8. Atrophy of the submandibular glands bilaterally.   Ct Head Wo Contrast 01/04/2019 IMPRESSION:  Slightly increased cytotoxic edema within the left MCA distribution. No hemorrhagic conversion.   Ct Head Wo Contrast 2018-10-01 \\IMPRESSION :  1. Large acute left MCA territory infarct. ASPECTS is 3.  2. Hyperdense left M1 and A1 segments.  No evidence of ACA ischemia. 3. No hemorrhagic transformation.   Transthoracic Echocardiogram  00/00/2020 Pending  ECG - paced rate 76 BPM   PHYSICAL EXAM  Temp:  [98.2 F (36.8 C)-99.3 F (37.4 C)] 99.3 F (37.4 C) (07/26 1100) Pulse Rate:  [59-85] 60 (  07/26 1100) Resp:  [12-31] 14 (07/26 1100) BP: (129-232)/(51-91) 208/63 (07/26 1100) SpO2:  [64 %-100 %] 100 % (07/26 1100) FiO2 (%):  [40 %-100 %] 40 % (07/26 1155) Weight:  [82.8 kg] 82.8 kg (07/26 0343)  General - Well nourished, well developed, intubated not on sedation.  Ophthalmologic - fundi not visualized due to noncooperation.  Cardiovascular - Regular rate and rhythm.  Neuro - intubated not on sedation, eyes closed by able to briefly open with repetitive stimulation, not following commands. With forced eye opening, eyes left gaze preference, spontaneous rolling eyes, able to cross midline, not blinking to visual threat bilaterally, not tracking, PERRL. Corneal reflex absent on the right, present on the left, gag and cough present. Breathing over the vent.  Facial symmetry not able to test due to ET tube.  Tongue protrusion not cooperative. LUE spontaneous movement, on  pain stimulation, no movement of RUE, slight withdraw BLEs. DTR 1+ and bilateral positive babinski. Sensation, coordination and gait not tested.   ASSESSMENT/PLAN [name] is a 85 y.o. female with history of CHF, CAD, COPD, dementia, hypothyroidism,diabetes, hypertension who was found with right-sided weakness and was brought to the emergency department at [facility]. Unfortunately, she aspirated and became hypoxemic necessitating intubation. She did not receive IV t-PA due to late presentation (>4.5 hours from time of onset). Not felt to be a thrombectomy candidate due to low aspect score and large volume of infarct on CT perfusion   Stroke: Large acute left MCA infarct - embolic - source unclear.  Resultant  Left gaze, right hemiplegia, not following commands  CT head - large infarct at left MCA distribution.  MRI head  - not ordered - pacemaker  CTA H&N - Partially occlusive thrombus in the proximal left M1 and A1 segments. High-grade right proximal ICA stenosis 75-80%. High-grade stenosis at the origin of the dominant left VA.   CTP - Large left MCA territory infarct. Core infarct volume is 92 mL.   CT repeat - Slightly increased cytotoxic edema within the left MCA distribution  2D Echo - pending  Hold off pacer interrogation as it will not change management   [employer] Virus 2 - negative  LDL - 67  HgbA1c - 8.3  VTE prophylaxis - Lovenox  Diet - NPO  aspirin 81 mg daily prior to admission, now on aspirin 325 mg daily.  Patient will be counseled to be compliant with her antithrombotic medications  Ongoing aggressive stroke risk factor management  Therapy recommendations:  Pending  Disposition:  Pending  Cerebral edema  CT repeat showed increased cerebral edema  Now on 3% saline - needs PICC if aggressive care  Na 148  Na Q6h  Na goal 150-155  Respiratory failure  Aspiration pneumonia  Respiratory decline likely due to  aspiration  Intubated on vent  CCM on board  On Rocephin  Hypertension  Stable . Permissive hypertension (OK if < 180/105) but gradually normalize in 5-7 days . Long-term BP goal normotensive  Hyperlipidemia  Lipid lowering medication PTA: Pravachol 80 mg daily  LDL 67, goal < 70  Current lipid lowering medication: Pravastatin 80 mg daily   Continue statin at discharge  Diabetes  HgbA1c 8.3, goal < 7.0  Uncontrolled  SSI  CBG monitoring  Social issue  Pt has 3 children but now taking care by APS  Discussed with APS direction today over the phone, will do DNR in am  Her SW in APS is Ms. Harrington Challenger [phone]  x [phone]  APS director personal line [phone]  Palliative care consult placed  Other Stroke Risk Factors  Advanced age  Coronary artery disease  CHF  Obstructive sleep apnea   Other Active Problems  Pacemaker (Medtronic)   Hypokalemia - 3.3 - supplemented  Creatinine - 1.13  Anemia of chronic disease - Hb - 10.8  Hospital day # 1  This patient is critically ill due to large left MCA infarct, respiratory failure, aspiration and at significant risk of neurological worsening, death form cerebral edema, brain herniation, fever, sepsis, seizure. This patient's care requires constant monitoring of vital signs, hemodynamics, respiratory and cardiac monitoring, review of multiple databases, neurological assessment, discussion with family, other specialists and medical decision making of high complexity. I spent 40 minutes of neurocritical care time in the care of this patient. I had long discussion with APS services over the phone. I also discussed with Dr. Tonia BroomsIcard.   Marvel PlanJindong Raynetta Osterloh, MD PhD Stroke Neurology 01/04/2019 6:16 PM   To contact Stroke Continuity provider, please refer to [email or web address]. After hours, contact General Neurology

## 2019-01-04 NOTE — Progress Notes (Signed)
PT Cancellation Note  Patient Details Name: [name] [name] MRN: [id number] DOB: 1933/05/08   Cancelled Treatment:    Reason Eval/Treat Not Completed: Active bedrest order. Pt intubated and sedated.    Lorriane Shire 01/04/2019, 8:01 AM   Lorrin Goodell, PT  Office # [phone] Pager [phone]

## 2019-01-04 NOTE — Progress Notes (Signed)
eLink Physician-Brief Progress Note Patient Name: KAISEY HUSEBY DOB: 09/12/1932 MRN: [id number]   Date of Service  01/04/2019  HPI/Events of Note  K 3.3, cr 1.15.  CTH Slightly increased cytotoxic edema within the left MCA distribution. No hemorrhagic conversion.   eICU Interventions  - Kcl 10 meq q1 hr x 2 doses. Continue neuro checks, consider neurology  Follow  up.      Intervention Category Intermediate Interventions: Electrolyte abnormality - evaluation and management  Elmer Sow 01/04/2019, 6:04 AM

## 2019-01-04 NOTE — Progress Notes (Signed)
Pt admitted overnight with R MCA stroke. Intubated for airway protection r/t poor mental status. No stroke intervention performed. Stable on vent.  Permissive HTN. PRN hydralazine ordered. Neuro following.  Labs, xrays ordered for 7/27. Discussed with RN at bedside.   Nickolas Madrid, NP 01/04/2019  12:16 PM Pager: [phone] or [phone]

## 2019-01-04 NOTE — Consult Note (Addendum)
Neurology Consultation Reason for Consult: Left MCA stroke Referring Physician: Vertis KelchIzquierdo, Manuel  CC: Left MCA stroke  History is obtained from: Chart review  HPI: [name] [name] is a 85 y.o. female with a history of CHF, CAD, COPD, dementia, diabetes, hypertension who was found with right-sided weakness and was brought to the emergency department at [facility].  She was evaluated by tele-neurology there, and was noted to have an aspect of 3 with 92 cc of infarct on CT perfusion and 196 mL of total ischemic area.  Due to the large infarct, she was unfortunately not a interventional candidate and the plan was to admit her to the floor for stroke management and potential palliative consultation.  Unfortunately, she aspirated and became hypoxemic and CT scan, necessitating intubation.   LKW: Unclear from notes, but for seen abnormal at 4 AM, so suspect 7/24 tpa given?: no, outside of window Thrombectomy?  No, low aspect score, large volume of infarct on CT perfusion  ROS: Unable to obtain due to altered mental status.  Past Medical History:  Diagnosis Date  . ANXIETY   . Arteriosclerotic cardiovascular disease (ASCVD)    Cath in 9/99-70% mid LAD with diffuse distal disease, 70% T1, 60% mid circumflex, 50% mid RCA, normal ejection fraction; negative stress nuclear in 07/2008  . Carpal tunnel syndrome   . Cerebrovascular disease    carotid bruits; no focal disease in 1999, 2002 and 2006  . Closed left fibular fracture 11/30/11  . COPD   . Dementia (HCC)   . Diabetes mellitus    Insulin treatment  . Diastolic CHF, chronic (HCC)    Normal EF  . Gastroesophageal reflux disease    With hiatal hernia; irritable bowel syndrome; H. pylori-treated; Colonoscopy 2008: non-specific colitis, IH, Diverticulosis, Rectal ulcer secondary to ASA  . HOH (hard of hearing)   . Hyperlipidemia   . Hypertension   . Hypothyroidism   . OSTEOARTHRITIS, KNEES, BILATERAL    Bilateral TKA  . Pacemaker    . Peripheral neuropathy   . Sinoatrial node dysfunction (HCC) 2003   Medtronic dual-chamber device  . Sleep apnea    pt could not tolerate  . VITAMIN B12 DEFICIENCY      Family History  Problem Relation Age of Onset  . COPD Father   . Heart attack Father [id number]  . Heart failure Mother   . Cancer Sister        colon  . Cancer Brother        lung  . Cancer Sister        bladder  . Cancer Brother        lung  . Early death Son   . Diabetes Son   . Heart disease Son   . Cancer Son        prostate  . Cancer Sister        kidney cancer  . Diabetes Sister   . Cancer Daughter      Social History:  reports that she has never smoked. She has never used smokeless tobacco. She reports that she does not drink alcohol or use drugs.   Exam: Current vital signs: BP (!) 163/61   Pulse (!) 59   Temp 98.2 F (36.8 C)   Resp 16   Ht 5\' 8"  (1.727 m)   Wt 82.8 kg   SpO2 100%   BMI 27.76 kg/m  Vital signs in last 24 hours: Temp:  [97.8 F (36.6 C)-98.6 F (37 C)] 98.2  F (36.8 C) (07/26 0000) Pulse Rate:  [59-85] 59 (07/26 0000) Resp:  [12-31] 16 (07/26 0000) BP: (141-232)/(51-91) 163/61 (07/26 0000) SpO2:  [64 %-100 %] 100 % (07/26 0000) FiO2 (%):  [40 %-100 %] 40 % (07/25 2334) Weight:  [82.8 kg-86.1 kg] 82.8 kg (07/26 0000)   Physical Exam  Constitutional: Appears well-developed and well-nourished.  Psych: Intubated, but seems mildly agitated Eyes: No scleral injection HENT: ET tube in place Head: Normocephalic.  Cardiovascular: Normal rate and regular rhythm.  Respiratory: Ventilated GI: Soft.  No distension. There is no tenderness.  Skin: WDI  Neuro: Mental Status: Patient awakens to mild stimuli, does not follow commands  cranial Nerves: II: She does not blink to threat from either direction, right pupil is slightly larger than left, both are briskly reactive III,IV, VI: Left gaze preference, but oculocephalic overcomes to the right V: VII: Appears to have  some right facial weakness Motor: She has 2/5 withdrawal with the right arm and leg, moves left side purposefully Sensory: She does not respond to noxious stimulation is much on the right is left Cerebellar: She does not perform   I have reviewed labs in epic and the results pertinent to this consultation are: Glucose 254  I have reviewed the images obtained: CT head-large left MCA infarct, CT perfusion agrees with this  Impression: 85 year old female with large left MCA infarct.  She is aware the state, therefore though I discussed with family that she likely does have a large stroke and may get worse over the next few days, will need to discuss with the empowered decision-makers in the morning as far as aggressiveness of care.  She has been intubated due to hypoxemia.  I think she is likely to develop severe edema, I will repeat a scan in a few hours and if that seems to be developing I will likely start 3% normal saline.  Recommendations: - HgbA1c, fasting lipid panel - Frequent neuro checks - Echocardiogram - Prophylactic therapy-Antiplatelet med: Aspirin - dose 325mg  PO or 300mg  PR - Risk factor modification - Telemetry monitoring - PT consult, OT consult, Speech consult - Stroke team to follow   This patient is critically ill and at significant risk of neurological worsening, death and care requires constant monitoring of vital signs, hemodynamics,respiratory and cardiac monitoring, neurological assessment, discussion with family, other specialists and medical decision making of high complexity. I spent 35 minutes of neurocritical care time  in the care of  this patient. This was time spent independent of any time provided by nurse practitioner or PA.  Roland Rack, MD [facility] [phone]  If 7pm- 7am, please page neurology on call as listed in [place]. 01/04/2019  1:33 AM

## 2019-01-05 ENCOUNTER — Inpatient Hospital Stay (HOSPITAL_COMMUNITY): Payer: Medicare HMO

## 2019-01-05 DIAGNOSIS — Z515 Encounter for palliative care: Secondary | ICD-10-CM

## 2019-01-05 DIAGNOSIS — G936 Cerebral edema: Secondary | ICD-10-CM

## 2019-01-05 DIAGNOSIS — Z7189 Other specified counseling: Secondary | ICD-10-CM

## 2019-01-05 DIAGNOSIS — T17908A Unspecified foreign body in respiratory tract, part unspecified causing other injury, initial encounter: Secondary | ICD-10-CM | POA: Diagnosis present

## 2019-01-05 LAB — GLUCOSE, CAPILLARY
Glucose-Capillary: 124 mg/dL — ABNORMAL HIGH (ref 70–99)
Glucose-Capillary: 226 mg/dL — ABNORMAL HIGH (ref 70–99)
Glucose-Capillary: 237 mg/dL — ABNORMAL HIGH (ref 70–99)
Glucose-Capillary: 179 mg/dL — ABNORMAL HIGH (ref 70–99)
Glucose-Capillary: 164 mg/dL — ABNORMAL HIGH (ref 70–99)
Glucose-Capillary: 168 mg/dL — ABNORMAL HIGH (ref 70–99)

## 2019-01-05 LAB — POCT I-STAT 7, (LYTES, BLD GAS, ICA,H+H)
Acid-Base Excess: 2 mmol/L (ref 0.0–2.0)
Bicarbonate: 25.3 mmol/L (ref 20.0–28.0)
Calcium, Ion: 1.35 mmol/L (ref 1.15–1.40)
HCT: 37 % (ref 36.0–46.0)
Hemoglobin: 12.6 g/dL (ref 12.0–15.0)
O2 Saturation: 99 %
Patient temperature: 37.4
Potassium: 3.1 mmol/L — ABNORMAL LOW (ref 3.5–5.1)
Sodium: 159 mmol/L — ABNORMAL HIGH (ref 135–145)
TCO2: 26 mmol/L (ref 22–32)
pCO2 arterial: 35.1 mmHg (ref 32.0–48.0)
pH, Arterial: 7.468 — ABNORMAL HIGH (ref 7.350–7.450)
pO2, Arterial: 122 mmHg — ABNORMAL HIGH (ref 83.0–108.0)

## 2019-01-05 LAB — CBC
HCT: 41.5 % (ref 36.0–46.0)
Hemoglobin: 12.5 g/dL (ref 12.0–15.0)
MCH: 25.3 pg — ABNORMAL LOW (ref 26.0–34.0)
MCHC: 30.1 g/dL (ref 30.0–36.0)
MCV: 83.8 fL (ref 80.0–100.0)
Platelets: 210 10*3/uL (ref 150–400)
RBC: 4.95 MIL/uL (ref 3.87–5.11)
RDW: 15.9 % — ABNORMAL HIGH (ref 11.5–15.5)
WBC: 12.2 10*3/uL — ABNORMAL HIGH (ref 4.0–10.5)
nRBC: 0 % (ref 0.0–0.2)

## 2019-01-05 LAB — BASIC METABOLIC PANEL
Anion gap: 14 (ref 5–15)
BUN: 46 mg/dL — ABNORMAL HIGH (ref 8–23)
CO2: 21 mmol/L — ABNORMAL LOW (ref 22–32)
Calcium: 9.7 mg/dL (ref 8.9–10.3)
Chloride: 120 mmol/L — ABNORMAL HIGH (ref 98–111)
Creatinine, Ser: 1.14 mg/dL — ABNORMAL HIGH (ref 0.44–1.00)
GFR calc Af Amer: 51 mL/min — ABNORMAL LOW (ref >60)
GFR calc non Af Amer: 44 mL/min — ABNORMAL LOW (ref >60)
Glucose, Bld: 226 mg/dL — ABNORMAL HIGH (ref 70–99)
Potassium: 3.2 mmol/L — ABNORMAL LOW (ref 3.5–5.1)
Sodium: 155 mmol/L — ABNORMAL HIGH (ref 135–145)

## 2019-01-05 LAB — PHOSPHORUS
Phosphorus: 2.6 mg/dL (ref 2.5–4.6)
Phosphorus: 2.4 mg/dL — ABNORMAL LOW (ref 2.5–4.6)

## 2019-01-05 LAB — MAGNESIUM
Magnesium: 2.2 mg/dL (ref 1.7–2.4)
Magnesium: 2.3 mg/dL (ref 1.7–2.4)

## 2019-01-05 LAB — SODIUM
Sodium: 155 mmol/L — ABNORMAL HIGH (ref 135–145)
Sodium: 157 mmol/L — ABNORMAL HIGH (ref 135–145)
Sodium: 158 mmol/L — ABNORMAL HIGH (ref 135–145)

## 2019-01-05 MED ORDER — LISINOPRIL 20 MG PO TABS
20.0000 mg | ORAL_TABLET | Freq: Every day | ORAL | Status: DC
  Filled 2019-01-05: qty 1

## 2019-01-05 MED ORDER — CARVEDILOL 12.5 MG PO TABS: 12.5000 mg | ORAL_TABLET | Freq: Two times a day (BID) | ORAL | Status: DC

## 2019-01-05 MED ORDER — POTASSIUM CHLORIDE 20 MEQ/15ML (10%) PO SOLN
40.0000 meq | Freq: Once | ORAL | Status: AC
  Administered 2019-01-05: 40 meq
  Filled 2019-01-05: qty 30

## 2019-01-05 MED ORDER — LISINOPRIL 20 MG PO TABS
20.0000 mg | ORAL_TABLET | Freq: Every day | ORAL | Status: DC
  Administered 2019-01-05 – 2019-01-06 (×2): 20 mg
  Filled 2019-01-05: qty 1

## 2019-01-05 MED ORDER — CHLORHEXIDINE GLUCONATE 0.12 % MT SOLN
OROMUCOSAL | Status: AC
  Filled 2019-01-05: qty 15

## 2019-01-05 MED ORDER — VITAL HIGH PROTEIN PO LIQD: 1000.0000 mL | ORAL | Status: DC

## 2019-01-05 MED ORDER — PRO-STAT SUGAR FREE PO LIQD: 30.0000 mL | Freq: Two times a day (BID) | ORAL | Status: DC

## 2019-01-05 MED ORDER — PRAVASTATIN SODIUM 40 MG PO TABS
80.0000 mg | ORAL_TABLET | Freq: Every day | ORAL | Status: DC
  Administered 2019-01-05: 80 mg
  Filled 2019-01-05: qty 2

## 2019-01-05 MED ORDER — CARVEDILOL 12.5 MG PO TABS
12.5000 mg | ORAL_TABLET | Freq: Two times a day (BID) | ORAL | Status: DC
  Administered 2019-01-05 – 2019-01-06 (×2): 12.5 mg
  Filled 2019-01-05 (×2): qty 1

## 2019-01-05 NOTE — Progress Notes (Signed)
SLP Cancellation Note  Patient Details Name: LATOY LABRIOLA MRN: [id number] DOB: Feb 02, 1933   Cancelled treatment:        Remains on vent. Will follow.    Houston Siren 01/05/2019, 7:57 AM  Orbie Pyo Colvin Caroli.Ed Risk analyst [phone] Office [phone]

## 2019-01-05 NOTE — Progress Notes (Addendum)
NAME:  [name] [name], MRN:  [id number], DOB:  November 18, 1932, LOS: 2 ADMISSION DATE:  2018-09-05, CONSULTATION DATE:  01/04/19 CHIEF COMPLAINT:  CVA   Brief History   This is a 85 yo female with history of chronic CHF, CAD, COPD, Dementia, HTN, and DM2. Patient apparently lives at assisted living facility. Here for further management of CVA and hypoxic respiratory failure.   History of present illness   This is a 85 yo female with history of chronic CHF, CAD, COPD, Dementia, HTN, and DM2. Patient apparently lives at assited living facility. She can ambulate at baseline per daughter. Staff noted patient to be more lethargic and flacid right arm. Brought into ED for stroke. Got CT head that confirmed Right MCA infarct. Got CT angiogram as well. Apparently while on way to CT patient aspirated. Had respiratory failure with sats around 65% and got intubated.  Past Medical History  CHF HTN CAD COPD DM2 Dementia  Significant Hospital Events   7/25 admitted/ intubated  7/26 3% saline  Consults:  Neurology  Procedures:  7/25 ETT >>  Significant Diagnostic Tests:  CT on 7/25 as noted below:  IMPRESSION: 1. Large left MCA territory infarct.  Core infarct volume is 92 mL. 2. Partially occlusive thrombus in the proximal left M1 and A1 segments. This may represent some reperfusion from earlier occlusion. 3. Atherosclerotic changes within the cavernous internal carotid arteries bilaterally. 4. Left ICA stenosis is less than 50%. 5. Higher grade right proximal ICA stenosis measuring 75-80% 6. High-grade stenosis at the origin of the dominant left vertebral artery. 7. Moderate stenoses of the right P2 segment of the posterior cerebral artery. 8. Atrophy of the submandibular glands bilaterally.  7/26 CTH >> Slightly increased cytotoxic edema within the left MCA distribution. No hemorrhagic conversion.  7/26 TTE >>  Micro Data:  COVID 19 negative  Antimicrobials:  CTX  Interim  history/subjective:  Apneic yesterday on several PSV trials More agitated, requiring low dose propofol Still with purposeful movement on left Right gaze today Ongoing permissive hypertension Weaning PSV 8/5 Na 155, 3% held currently   Objective   Blood pressure (!) 195/85, pulse 69, temperature 99.1 F (37.3 C), resp. rate 18, height 5\' 8"  (1.727 m), weight 79.9 kg, SpO2 100 %.    Vent Mode: PRVC FiO2 (%):  [30 %-40 %] 30 % Set Rate:  [16 bmp] 16 bmp Vt Set:  [510 mL] 510 mL PEEP:  [5 cmH20] 5 cmH20 Plateau Pressure:  [8 cmH20-17 cmH20] 14 cmH20   Intake/Output Summary (Last 24 hours) at 01/05/2019 0945 Last data filed at 01/05/2019 0800 Gross per 24 hour  Intake 810.05 ml  Output 2475 ml  Net -1664.95 ml   Filed Weights   01/04/19 0000 01/04/19 0343 01/05/19 0500  Weight: 82.8 kg 82.8 kg 79.9 kg    Examination: General:  Chronically ill elderly female in NAD on MV HEENT: MM pink/moist, 7 ett at 22 cm, OGT, pupils 3/minimally reactive with right gaze Neuro: does not f/c, open eyes, purposeful movement on left, withdrawals to noxious on RLE, RUE flaccid CV: rr, paced on monitor, pacer in left upper chest PULM: even/non-labored, lungs bilaterally clear diminished bases WU:JWJXGI:soft, non-tender, bs active, foley present Extremities: warm/dry, no edema  Skin: no rashes    Resolved Hospital Problem list   NA  Assessment & Plan:   Left MCA CVA with cytotoxic edema- Not a candidate for decompression or surgical interventions in future per neurology. PLAN -  Neuro following ASA  Hypertonic saline currently held, goal Na 150-155 Continue Na q 6hrs Echo pending   EEG today to r/o seizure with right gaze PMT consulted, ?likely to transition to comfort care given poor prognosis.  APS is the decision maker, family is still allowed to call and receive information.  APS to fax paperwork today (7/27) to start paperwork change to DNR status.   Hypoxic respiratory failure 2/2  aspiration- PLAN -  Vent support - 8cc/kg, decrease rate to 15  Trend CXR/ ABG - CXR reviewed today with stable ETT/ lines, no focal infiltrate  Daily SBT, mental status will likely be barrier against extubation - continue ceftriaxone   DM2-Patient on metformin at home SSI moderate  HTN- Allowing for permissive HTN PRN for BP greater than 220/110 Restarting home coreg and lisinopril, holding home torsemide  Prn apresoline   Hypothyroidism Continue home synthroid per tube  COPD?-Home meds include BREO-Will be difficult to administer for now. No PFT's in system. Patient with no signs of exacerbation at this time.  -Albuterol prn  HFrEF-Patient with EF of 35-40% and diffuse hypokinesis -TTE pending, restarting coreg and lisinopril as above  - dc NS  H/o arrhythmia/sick sinus syndrome--status post Medtronic dual-chamber pacemaker placement -Noted  History of ACS -Continue home statin and aspirin  Global: Patient is at [facility]. Her care is dictated by APS.  APS is not disclosing reason.  Family is still allowed updates.  APS contact - Janann August at [phone] ext [phone]. Of note patient was full code at her facility.  APS faxing paperwork 7/27 to change code status to DNR.  PMT following.  ?likely transition to comfort care/ one-way extubation when able given poor neurological prognosis.  Best practice:  Diet: delay TF given likely transition to comfort care soon  Pain/Anxiety/Delirium protocol (if indicated):  VAP protocol (if indicated): HOB at 30 degrees DVT prophylaxis: Lovenox for now GI prophylaxis: Pepcid  Glucose control: Q 4 hr and ISS Mobility: NA Code Status: Full for now.  APS in process of approving DNR paperwork filled by Dr. Erlinda Hong and Dr. Halford Chessman  Family Communication: pending. Disposition: ICU  Labs   CBC: Recent Labs  Lab January 14, 2019 0740 01/04/19 0210 01/05/19 0449 01/05/19 0634  WBC 7.5 9.2  --  12.2*  NEUTROABS 5.6  --   --   --   HGB  11.2* 10.8* 12.6 12.5  HCT 35.7* 34.7* 37.0 41.5  MCV 81.5 80.7  --  83.8  PLT 220 237  --  932    Basic Metabolic Panel: Recent Labs  Lab Jan 14, 2019 0740 01/14/2019 0741 01/04/19 0210  01/04/19 1318 01/04/19 1834 01/05/19 0103 01/05/19 0449 01/05/19 0634  NA 134*  --  140   < > 148* 153* 155* 159* 155*  K 3.7  --  3.3*  --   --   --   --  3.1* 3.2*  CL 94*  --  99  --   --   --   --   --  120*  CO2 29  --  24  --   --   --   --   --  21*  GLUCOSE 254*  --  276*  --   --   --   --   --  226*  BUN 73*  --  63*  --   --   --   --   --  46*  CREATININE 1.16* 1.20* 1.13*  --   --   --   --   --  1.14*  CALCIUM 9.6  --  9.7  --   --   --   --   --  9.7  MG  --   --  1.9  --   --   --   --   --  2.2  PHOS  --   --  4.7*  --   --   --   --   --  2.6   < > = values in this interval not displayed.   GFR: Estimated Creatinine Clearance: 40.8 mL/min (A) (by C-G formula based on SCr of 1.14 mg/dL (H)). Recent Labs  Lab July 05, 2018 0740 01/04/19 0210 01/05/19 0634  WBC 7.5 9.2 12.2*    Liver Function Tests: Recent Labs  Lab July 05, 2018 0740  AST 20  ALT 12  ALKPHOS 99  BILITOT 0.6  PROT 7.9  ALBUMIN 3.7   No results for input(s): LIPASE, AMYLASE in the last 168 hours. No results for input(s): AMMONIA in the last 168 hours.  ABG    Component Value Date/Time   PHART 7.468 (H) 01/05/2019 0449   PCO2ART 35.1 01/05/2019 0449   PO2ART 122.0 (H) 01/05/2019 0449   HCO3 25.3 01/05/2019 0449   TCO2 26 01/05/2019 0449   O2SAT 99.0 01/05/2019 0449     Coagulation Profile: Recent Labs  Lab July 05, 2018 0740  INR 1.1    Cardiac Enzymes: No results for input(s): CKTOTAL, CKMB, CKMBINDEX, TROPONINI in the last 168 hours.  HbA1C: HB A1C (BAYER DCA - WAIVED)  Date/Time Value Ref Range Status  06/25/2018 11:48 AM 7.1 (H) <7.0 % Final    Comment:                                          Diabetic Adult            <7.0                                       Healthy Adult        4.3 -  5.7                                                           (DCCT/NGSP) American Diabetes Association's Summary of Glycemic Recommendations for Adults with Diabetes: Hemoglobin A1c <7.0%. More stringent glycemic goals (A1c <6.0%) may further reduce complications at the cost of increased risk of hypoglycemia.   05/12/2018 12:48 PM 6.5 <7.0 % Final    Comment:                                          Diabetic Adult            <7.0                                       Healthy Adult        4.3 - 5.7                                                           (  DCCT/NGSP) American Diabetes Association's Summary of Glycemic Recommendations for Adults with Diabetes: Hemoglobin A1c <7.0%. More stringent glycemic goals (A1c <6.0%) may further reduce complications at the cost of increased risk of hypoglycemia.    Hgb A1c MFr Bld  Date/Time Value Ref Range Status  01/04/2019 02:10 AM 8.3 (H) 4.8 - 5.6 % Final    Comment:    (NOTE) Pre diabetes:          5.7%-6.4% Diabetes:              >6.4% Glycemic control for   <7.0% adults with diabetes     CBG: Recent Labs  Lab 01/04/19 1544 01/04/19 1949 01/04/19 2337 01/05/19 0341 01/05/19 0758  GLUCAP 216* 156* 154* 124* 226*     Critical care time: 40 mins     Posey BoyerBrooke Kolson Chovanec, MSN, AGACNP-BC [facility] Pgr: [phone] or if no answer [phone] 01/05/2019, 9:48 AM

## 2019-01-05 NOTE — Consult Note (Signed)
Consultation Note Date: 01/05/2019   Patient Name: [name] [name]  DOB: September 23, 1932  MRN: [id number]  Age / Sex: 85 y.o., female  PCP: Sharion Balloon, FNP Referring Physician: Tyna Jaksch, MD  Reason for Consultation: Establishing goals of care and Psychosocial/spiritual support  HPI/Patient Profile: 85 y.o. female  with past medical history of cerebral vascular disease, COPD, pacemaker placement, diabetes with peripheral neuropathy, hearing loss, dementia, coronary artery disease and diastolic heart failure who was admitted on 12/27/2018 with right arm weakness, and altered mental status.  She was found to have a large left MCA cerebrovascular accident.  Unfortunately she aspirated on the way to the CT scan and was intubated for respiratory failure.    The most recent CT scan (7/26) shows slightly increased cytotoxic edema in a large left MCA territory infarct.  There is a mass-effect on the lateral ventricles that has slightly increased.  She has a midline shift of 3 mm.  EEG done today shows no seizures, however the study is suggestive of potential epileptogenicity in the right frontotemporal region as well as cortical dysfunction in the same region.  There is also evidence of moderate to severe diffuse encephalopathy.  The patient remains sedated to prevent seizures.  Ms. Birchmeier has an APS guardian named Marinda Elk who is easily accessible on [phone], extension [phone].  Clinical Assessment and Goals of Care:  I have reviewed medical records including EPIC notes, labs and imaging, received report from Dr.Xu and CCM, assessed the patient and then spoke on the phone with Marinda Elk, [place] guardian social worker and separately Aleen Campi the patient's daughter to discuss diagnosis prognosis, [place], EOL wishes, disposition and options.  I introduced Palliative Medicine as specialized medical care for people  living with serious illness. It focuses on providing relief from the symptoms and stress of a serious illness.   Ms. Harrington Challenger and I discussed the patient's current health status.  She is minimally responsive to sternal rub.  She does not open her eyes, but when her lids are lifted her pupils are fixed, nonreactive to light and she gazes right.  She is intubated but has been able to wean for several hours.  Ms. Harrington Challenger explained that CCM and neurology are in the process of completing paperwork for DNR.  I committed to follow-up with them.  In speaking with Dr. Erlinda Hong of neurology he felt that the patient has minimal if any chance for functional recovery and recommended comfort care.  Critical care is also on board with comfort measures.  I spoke with the patient's daughter Aleen Campi on the phone and asked about her mother's values and what type of life she would like to live.  I explained that it may be possible for her mother to live through this stroke but she would most likely live a life being fully dependent, bedbound, and being fed through tube.  Hoyle Sauer replied "absolutely not.  I have already talked to my siblings about this we do not want my mother to live that  way."  Hoyle Sauer went on to say that she has a living will which specifies that she would never want to live that way.  I supported her feelings.    Hoyle Sauer made it clear that the family wishes for their mother to be comfortable from this point on.  We discussed stopping lab draws and decreasing the frequency of vital signs while allowing the court to review paperwork requesting DNR and comfort measures.  Hoyle Sauer asked if she and her 3 siblings could be present for the extubation.  I explained our visitation policy will allow for people in the hospital and two at a time at bedside.  Hoyle Sauer stated she would call her siblings and explained that in the next day or so their mother would shift to full comfort and her prognosis will be short.  Questions  and concerns were addressed.  The family was encouraged to call with questions or concerns.    Primary Decision Maker:  [place] APS guardianship social worker    SUMMARY OF RECOMMENDATIONS    1.  Primary team is submitting paperwork to APS for DNR.   2.  Primary team, neurology, palliative medicine, patient's family are in support of extubation and comfort measures once the court gives their approval. 3.  The patient's 4 children would like to be present for extubation. 4.  If the patient stabilizes after extubation she would be an excellent candidate for [place] care. 5.  I believe it is appropriate at this time to decrease the frequency of vital signs and discontinue lab draws in order to begin to make Ms. Glad more comfortable.  Code Status/Advance Care Planning:  Full code   Symptom Management:   Agree with fentanyl for comfort.  Would consider continuing antiseizure medication given EEG results-defer to neurology.  Palliative Prophylaxis:   Oral Care and Turn Reposition  Prognosis: Hours to days once the ETT tube has been removed and she has been completely shifted to comfort measures.    Discharge Planning: Anticipated Hospital Death versus possible discharge to hospice facility if she stabilizes.      Primary Diagnoses: Present on Admission: . Acute ischemic left MCA stroke ([place]) . Hemi-neglect of right side . Hypothyroid . PPM-Medtronic . Type II diabetes mellitus with peripheral autonomic neuropathy (HCC) . Chronic combined systolic (congestive) and diastolic (congestive) heart failure ([place]) . Chronic obstructive pulmonary disease ([place]) . Hypertension associated with diabetes ([place]) . Acute ischemic stroke ([place]) . CVA (cerebral vascular accident) ([place])   I have reviewed the medical record, interviewed the patient and family, and examined the patient. The following aspects are pertinent.  Past Medical History:  Diagnosis Date   . ANXIETY   . Arteriosclerotic cardiovascular disease (ASCVD)    Cath in 9/99-70% mid LAD with diffuse distal disease, 70% T1, 60% mid circumflex, 50% mid RCA, normal ejection fraction; negative stress nuclear in 07/2008  . Carpal tunnel syndrome   . Cerebrovascular disease    carotid bruits; no focal disease in 1999, 2002 and 2006  . Closed left fibular fracture 11/30/11  . COPD   . Dementia ([place])   . Diabetes mellitus    Insulin treatment  . Diastolic CHF, chronic (HCC)    Normal EF  . Gastroesophageal reflux disease    With hiatal hernia; irritable bowel syndrome; H. pylori-treated; Colonoscopy 2008: non-specific colitis, IH, Diverticulosis, Rectal ulcer secondary to ASA  . HOH (hard of hearing)   . Hyperlipidemia   . Hypertension   . Hypothyroidism   .  OSTEOARTHRITIS, KNEES, BILATERAL    Bilateral TKA  . Pacemaker   . Peripheral neuropathy   . Sinoatrial node dysfunction ([place]) 2003   Medtronic dual-chamber device  . Sleep apnea    pt could not tolerate  . VITAMIN B12 DEFICIENCY    Social History   Socioeconomic History  . Marital status: Widowed    Spouse name: husband has dementia  . Number of children: Not on file  . Years of education: 8  . Highest education level: Not on file  Occupational History  . Occupation: retired    Fish farm manager: RETIRED  Social Needs  . Financial resource strain: Not on file  . Food insecurity    Worry: Not on file    Inability: Not on file  . Transportation needs    Medical: Not on file    Non-medical: Not on file  Tobacco Use  . Smoking status: Never Smoker  . Smokeless tobacco: Never Used  Substance and Sexual Activity  . Alcohol use: No    Alcohol/week: 0.0 standard drinks  . Drug use: No  . Sexual activity: Not Currently  Lifestyle  . Physical activity    Days per week: Not on file    Minutes per session: Not on file  . Stress: Not on file  Relationships  . Social Herbalist on phone: Not on file    Gets  together: Not on file    Attends religious service: Not on file    Active member of club or organization: Not on file    Attends meetings of clubs or organizations: Not on file    Relationship status: Not on file  Other Topics Concern  . Not on file  Social History Narrative  . Not on file   Family History  Problem Relation Age of Onset  . COPD Father   . Heart attack Father 74  . Heart failure Mother   . Cancer Sister        colon  . Cancer Brother        lung  . Cancer Sister        bladder  . Cancer Brother        lung  . Early death Son   . Diabetes Son   . Heart disease Son   . Cancer Son        prostate  . Cancer Sister        kidney cancer  . Diabetes Sister   . Cancer Daughter    Scheduled Meds: . aspirin  300 mg Rectal Daily   Or  . aspirin  325 mg Oral Daily  . carvedilol  12.5 mg Per Tube BID WC  . chlorhexidine gluconate (MEDLINE KIT)  15 mL Mouth Rinse BID  . Chlorhexidine Gluconate Cloth  6 each Topical Q0600  . enoxaparin (LOVENOX) injection  40 mg Subcutaneous Q24H  . insulin aspart  0-15 Units Subcutaneous Q4H  . levothyroxine  200 mcg Per Tube Q0600  . [START ON 01/06/2019] lisinopril  20 mg Per Tube Daily  . mouth rinse  15 mL Mouth Rinse 10 times per day  . mupirocin ointment  1 application Nasal BID  . pantoprazole (PROTONIX) IV  40 mg Intravenous QHS  . potassium chloride  40 mEq Per Tube Once  . pravastatin  80 mg Per Tube q1800  . sodium chloride flush  3 mL Intravenous Once   Continuous Infusions: . sodium chloride Stopped (01/04/19 0722)  . cefTRIAXone (ROCEPHIN)  IV Stopped (01/05/19 0155)  . propofol (DIPRIVAN) infusion 5 mcg/kg/min (01/05/19 1100)   PRN Meds:.[DISCONTINUED] acetaminophen **OR** [DISCONTINUED] acetaminophen (TYLENOL) oral liquid 160 mg/5 mL **OR** acetaminophen, acetaminophen, albuterol, fentaNYL (SUBLIMAZE) injection, hydrALAZINE Allergies  Allergen Reactions  . Penicillins     Has patient had a PCN reaction  causing immediate rash, facial/tongue/throat swelling, SOB or lightheadedness with hypotension: Unknown Has patient had a PCN reaction causing severe rash involving mucus membranes or skin necrosis: Unknown Has patient had a PCN reaction that required hospitalization: Unknown Has patient had a PCN reaction occurring within the last 10 years: No If all of the above answers are "NO", then may proceed with Cephalosporin use.    Review of Systems patient unable to respond  Physical Exam  Well-developed chronically ill-appearing elderly female, intubated, EEG probes on her head Does not respond to voice or light touch.  She begins to reflexively chew the ETT tube when I sternal rub her CV regular rate and rhythm Respirations no distress Abdomen soft, nontender, nondistended Left wrist is restrained to the bed rail. Pupils are fixed, and nonresponsive to light.  Gaze is to the right.  Unable to follow any commands  Vital Signs: BP (!) 206/175   Pulse 74   Temp 99.1 F (37.3 C)   Resp 18   Ht '5\' 8"'  (1.727 m)   Wt 79.9 kg   SpO2 100%   BMI 26.78 kg/m  Pain Scale: CPOT   Pain Score: 0-No pain   SpO2: SpO2: 100 % O2 Device:SpO2: 100 % O2 Flow Rate: .   IO: Intake/output summary:   Intake/Output Summary (Last 24 hours) at 01/05/2019 1326 Last data filed at 01/05/2019 1100 Gross per 24 hour  Intake 744.17 ml  Output 2700 ml  Net -1955.83 ml    LBM:   Baseline Weight: Weight: 86.1 kg Most recent weight: Weight: 79.9 kg     Palliative Assessment/Data: 10%     Time In: 12:15 pm Time Out: 1: 56 pm Time Total: 100 minutes Visit consisted of counseling and education dealing with the complex and emotionally intense issues surrounding the need for palliative care and symptom management in the setting of serious and potentially life-threatening illness. Greater than 50%  of this time was spent counseling and coordinating care related to the above assessment and plan.  Signed by:  Florentina Jenny, PA-C Palliative Medicine Pager: [phone]  Please contact Palliative Medicine Team phone at [phone] for questions and concerns.  For individual provider: See Shea Evans

## 2019-01-05 NOTE — Progress Notes (Signed)
Nutrition Consut  Received MD Consult for TF initiation and management.  Chart reviewed. Discussed with RN, who stated plans to hold off on TF initiation today because patient will likely be transitioned to comfort care very soon.   Neurology and CCM physicians are recommending comfort care. Palliative Care team has talked with patient's daughter who wants patient to be transitioned to comfort care. Awaiting APS guardian decision for goals of care.  No nutrition interventions indicated at this time. Please re-consult if plans to begin nutrition support.    Molli Barrows, RD, LDN, [place] Pager [phone] After Hours Pager [phone]

## 2019-01-05 NOTE — Progress Notes (Signed)
Bedside EEG completed, results pending. 

## 2019-01-05 NOTE — Progress Notes (Signed)
PT Cancellation Note  Patient Details Name: [name] [name] MRN: [id number] DOB: May 20, 1933   Cancelled Treatment:    Reason Eval/Treat Not Completed: Other (comment) Pt remains on vent with no command following and R dense hemiplegia. Per Dr. Erlinda Hong pt now DNR and palliative care has been consulted for goals of care. Pt has 3 children but medical decisions are being made by APS, unsure of what the situation is. Acute PT to wait on palliative care consult and recommendations prior to completing PT Eval.  Kittie Plater, PT, DPT Acute Rehabilitation Services Pager #: [phone] Office #: [phone]    [place] 01/05/2019, 11:10 AM

## 2019-01-05 NOTE — Procedures (Signed)
Patient Name: [name] [name]  MRN: [id number]  Epilepsy Attending: Lora Havens  Referring Physician/Provider: Dr Rosalin Hawking Date:01/05/19  Duration: 23.33 mins  Patient history: 85yo F with large Left MCA stroke. Had eyes deviated to right this am. EEG to rule out seizure.   Level of alertness: sedated  AEDs during EEG study: propofol  Technical aspects: This EEG study was done with scalp electrodes positioned according to the 10-20 International system of electrode placement. Electrical activity was reviewed with a high frequency filter of 70Hz  and a low frequency filter of 1Hz . EEG data were recorded continuously and digitally stored.   BACKGROUND ACTIVITY:  Slowing: Continuous generalized, maximum right frontotemporal 2-5 hz theta-delta slowing, sometimes with triphasic morphology   EPILEPTIFORM ACTIVITY: Interictal epileptiform activity: Sharp waves seen in right frontotemporal region, sometimes in runs  ACTIVATION PROCEDURES:  Hyperventilation and photic stimulation were not performed.  IMPRESSION: This study is suggestive of potential epileptogenicity in right frontotemporal region as well as cortical dysfunction in the same region. There is also evidence of moderate to severe diffuse encephalopathy.  No seizures were seen throughout the recording.  Quinn Bartling Barbra Sarks

## 2019-01-05 NOTE — Progress Notes (Signed)
STROKE TEAM PROGRESS NOTE   SUBJECTIVE (INTERVAL HISTORY) Her RN is at the bedside. Pt still intubated not following commands. Still has right arm flaccid and right leg less movement than left. However, eyes gaze to right this am. Will do EEG to rule out seizure. Completed DNR form from APS. Will have palliative care on board. Na 159, off 3% saline and now on NS.   OBJECTIVE Vitals:   01/05/19 0500 01/05/19 0600 01/05/19 0700 01/05/19 0728  BP: (!) 214/82 (!) 191/66 (!) 195/85 (!) 195/85  Pulse: 69 78 69 69  Resp: 16 20 17 18   Temp: 99.3 F (37.4 C) 99.5 F (37.5 C) 99.1 F (37.3 C) 99.1 F (37.3 C)  TempSrc:      SpO2: 100% 100% 100% 100%  Weight: 79.9 kg     Height:        CBC:  Recent Labs  Lab 12/24/18 0740 01/04/19 0210 01/05/19 0449 01/05/19 0634  WBC 7.5 9.2  --  12.2*  NEUTROABS 5.6  --   --   --   HGB 11.2* 10.8* 12.6 12.5  HCT 35.7* 34.7* 37.0 41.5  MCV 81.5 80.7  --  83.8  PLT 220 237  --  210    Basic Metabolic Panel:  Recent Labs  Lab 01/04/19 0210  01/05/19 0449 01/05/19 0634  NA 140   < > 159* 155*  K 3.3*  --  3.1* 3.2*  CL 99  --   --  120*  CO2 24  --   --  21*  GLUCOSE 276*  --   --  226*  BUN 63*  --   --  46*  CREATININE 1.13*  --   --  1.14*  CALCIUM 9.7  --   --  9.7  MG 1.9  --   --  2.2  PHOS 4.7*  --   --  2.6   < > = values in this interval not displayed.    Lipid Panel:     Component Value Date/Time   CHOL 144 01/04/2019 0210   CHOL 163 08/11/2018 1332   TRIG 69 01/04/2019 0210   TRIG 120 02/20/2013 1310   TRIG 101 12/11/2009   HDL 63 01/04/2019 0210   HDL 73 08/11/2018 1332   HDL 65 02/20/2013 1310   CHOLHDL 2.3 01/04/2019 0210   VLDL 14 01/04/2019 0210   LDLCALC 67 01/04/2019 0210   LDLCALC 69 08/11/2018 1332   LDLCALC 69 02/20/2013 1310   HgbA1c:  Lab Results  Component Value Date   HGBA1C 8.3 (H) 01/04/2019   Urine Drug Screen: No results found for: LABOPIA, COCAINSCRNUR, LABBENZ, AMPHETMU, THCU, LABBARB   Alcohol Level No results found for: ETH  IMAGING  Ct Angio Head W Or Wo Contrast Ct Angio Neck W And/or Wo Contrast Ct Cerebral Perfusion W Contrast  06/18/18 IMPRESSION:  1. Large left MCA territory infarct. Core infarct volume is 92 mL.  2. Partially occlusive thrombus in the proximal left M1 and A1 segments. This may represent some reperfusion from earlier occlusion.  3. Atherosclerotic changes within the cavernous internal carotid arteries bilaterally.  4. Left ICA stenosis is less than 50%.  5. Higher grade right proximal ICA stenosis measuring 75-80%  6. High-grade stenosis at the origin of the dominant left vertebral artery.  7. Moderate stenoses of the right P2 segment of the posterior cerebral artery.  8. Atrophy of the submandibular glands bilaterally.   Ct Head Wo Contrast 01/04/2019 IMPRESSION:  Slightly  increased cytotoxic edema within the left MCA distribution. No hemorrhagic conversion.   Ct Head Wo Contrast 12/12/2018 \\IMPRESSION :  1. Large acute left MCA territory infarct. ASPECTS is 3.  2. Hyperdense left M1 and A1 segments.  No evidence of ACA ischemia. 3. No hemorrhagic transformation.   Transthoracic Echocardiogram  Pending  ECG - paced rate 76 BPM   PHYSICAL EXAM  Temp:  [99.1 F (37.3 C)-99.9 F (37.7 C)] 99.1 F (37.3 C) (07/27 0728) Pulse Rate:  [59-78] 69 (07/27 0728) Resp:  [14-24] 18 (07/27 0728) BP: (113-224)/(46-85) 195/85 (07/27 0728) SpO2:  [98 %-100 %] 100 % (07/27 0728) FiO2 (%):  [30 %-40 %] 30 % (07/27 0728) Weight:  [79.9 kg] 79.9 kg (07/27 0500)  General - Well nourished, well developed, intubated on low dose sedation.  Ophthalmologic - fundi not visualized due to noncooperation.  Cardiovascular - Regular rate and rhythm.  Neuro - intubated on low dose propofol, eyes closed, not following commands. With forced eye opening, eyes right gaze preference, limited spontaneous rolling eyes, barely able to cross midline, not  blinking to visual threat bilaterally, not tracking, PERRL. Corneal reflex absent on the right, present on the left, gag and cough present. Breathing over the vent.  Facial symmetry not able to test due to ET tube.  Tongue protrusion not cooperative. LUE spontaneous movement, on pain stimulation, no movement of RUE, mild withdraw LLE and slight withdraw RLE. DTR 1+ and bilateral positive babinski. Sensation, coordination and gait not tested.   ASSESSMENT/PLAN Ms. Allean Foundda M Demeter is a 85 y.o. female with history of CHF, CAD, COPD, dementia, hypothyroidism,diabetes, hypertension who was found with right-sided weakness and was brought to [facility] emergency department at [facility]. Unfortunately, she aspirated and became hypoxemic necessitating intubation. She did not receive IV t-PA due to late presentation (>4.5 hours from time of onset). Not felt to be a thrombectomy candidate due to low aspect score and large volume of infarct on CT perfusion   Stroke: Large acute left MCA infarct - embolic - source unclear.  Resultant  Left gaze, right hemiplegia, not following commands  CT head - large infarct at left MCA distribution.  MRI head  - not ordered - pacemaker  CTA H&N - Partially occlusive thrombus in the proximal left M1 and A1 segments. High-grade right proximal ICA stenosis 75-80%. High-grade stenosis at the origin of the dominant left VA.   CTP - Large left MCA territory infarct. Core infarct volume is 92 mL.   CT repeat - Slightly increased cytotoxic edema within the left MCA distribution  2D Echo - pending  Hold off pacer interrogation as it will not change management   [employer] Corona Virus 2 - negative  LDL - 67  HgbA1c - 8.3  VTE prophylaxis - Lovenox  Diet - NPO  aspirin 81 mg daily prior to admission, now on aspirin 325 mg daily.  Patient will be counseled to be compliant with her antithrombotic medications  Ongoing aggressive stroke risk factor management  Therapy  recommendations:  Pending  Disposition:  Pending  Prognosis is poor, unlikely to have meaningful recover, palliative care on board  Cerebral edema  CT repeat showed increased cerebral edema  Now off 3% saline  On NS @ 50  Na 148->155->159->155  Na Q6h  Na goal 150-155  Respiratory failure  Aspiration pneumonia  Respiratory decline likely due to aspiration  Intubated on vent  CCM on board  On Rocephin  Afebrile  Leukocytosis 9.2->12.2  Hypertension  Stable on the high end  Put back on home meds with coreg and lisinopril . Gradually normalize in 5-7 days . Long-term BP goal normotensive  Hyperlipidemia  Lipid lowering medication PTA: Pravachol 80 mg daily  LDL 67, goal < 70  Current lipid lowering medication: Pravastatin 80 mg daily   Continue statin at discharge  Diabetes  HgbA1c 8.3, goal < 7.0  Uncontrolled  SSI  hyperglycemia  CBG monitoring  CCM on board  Social issue  Pt has 3 children but now taking care by APS  Discussed with APS direction today over the phone, DNR form filled in am  Her SW in APS is Ms. Harrington Challenger [phone]  APS director personal line [phone]  Palliative care consult placed  Other Stroke Risk Factors  Advanced age  Coronary artery disease  CHF  Obstructive sleep apnea   Other Active Problems  Pacemaker (Medtronic)   Hypokalemia - 3.3 ->3.2 supplemented  Creatinine - 1.13->1.14  Anemia of chronic disease - Hb - 10.8->12.5  Hospital day # 2  This patient is critically ill due to large left MCA infarct, respiratory failure, aspiration and at significant risk of neurological worsening, death form cerebral edema, brain herniation, fever, sepsis, seizure. This patient's care requires constant monitoring of vital signs, hemodynamics, respiratory and cardiac monitoring, review of multiple databases, neurological assessment, discussion with family, other specialists and medical decision making  of high complexity. I spent 35 minutes of neurocritical care time in the care of this patient. I also discussed with CCM NP and filled the DNR forms from APS.   Rosalin Hawking, MD PhD Stroke Neurology 01/05/2019 10:25 AM   To contact Stroke Continuity provider, please refer to [email or web address] After hours, contact General Neurology

## 2019-01-05 NOTE — Progress Notes (Signed)
OT Cancellation Note  Patient Details Name: [name] [name] MRN: [id number] DOB: April 16, 1933   Cancelled Treatment:    Reason Eval/Treat Not Completed: Other (comment); pt remains on vent with no command following and R dense hemiplegia. Per Dr. Erlinda Hong pt now DNR and palliative care has been consulted for goals of care. Will follow up for OT eval as appropriate.  Lou Cal, OT Supplemental Rehabilitation Services Pager [phone] Office [phone]   Raymondo Band 01/05/2019, 11:19 AM

## 2019-01-05 NOTE — Significant Event (Signed)
PCCM Interval Note  Received a phone call from [place] worker, Marinda Elk (contact info [phone] at ext [phone] or cell [phone]) who states that paperwork is completed and patient is now legally a DNR/ DNI.    Paperwork is still in process for permission from APS to transition patient to comfort care based on Neurology/ PCCM recommendations and from patient's family who stated to PMT that patient would not want any advanced or prolonged life support.  She anticipates that court documents would be ready tomorrow morning.  Will need then to coordinate with family for time to transition to comfort care as family has requested to be present.    Code status changed to DNR/ DNI to reflect discussion and RN updated.    Kennieth Rad, MSN, AGACNP-BC [facility] Pgr: [phone] or if no answer [phone] 01/05/2019, 4:55 PM

## 2019-01-06 DIAGNOSIS — G935 Compression of brain: Secondary | ICD-10-CM

## 2019-01-06 LAB — RENAL FUNCTION PANEL
Albumin: 3.2 g/dL — ABNORMAL LOW (ref 3.5–5.0)
Anion gap: 11 (ref 5–15)
BUN: 52 mg/dL — ABNORMAL HIGH (ref 8–23)
CO2: 23 mmol/L (ref 22–32)
Calcium: 10.1 mg/dL (ref 8.9–10.3)
Chloride: 126 mmol/L — ABNORMAL HIGH (ref 98–111)
Creatinine, Ser: 1.35 mg/dL — ABNORMAL HIGH (ref 0.44–1.00)
GFR calc Af Amer: 42 mL/min — ABNORMAL LOW (ref >60)
GFR calc non Af Amer: 36 mL/min — ABNORMAL LOW (ref >60)
Glucose, Bld: 201 mg/dL — ABNORMAL HIGH (ref 70–99)
Phosphorus: 2.1 mg/dL — ABNORMAL LOW (ref 2.5–4.6)
Potassium: 3.7 mmol/L (ref 3.5–5.1)
Sodium: 160 mmol/L — ABNORMAL HIGH (ref 135–145)

## 2019-01-06 LAB — CBC
HCT: 44.4 % (ref 36.0–46.0)
Hemoglobin: 13 g/dL (ref 12.0–15.0)
MCH: 25.1 pg — ABNORMAL LOW (ref 26.0–34.0)
MCHC: 29.3 g/dL — ABNORMAL LOW (ref 30.0–36.0)
MCV: 85.7 fL (ref 80.0–100.0)
Platelets: 196 10*3/uL (ref 150–400)
RBC: 5.18 MIL/uL — ABNORMAL HIGH (ref 3.87–5.11)
RDW: 16.2 % — ABNORMAL HIGH (ref 11.5–15.5)
WBC: 11.8 10*3/uL — ABNORMAL HIGH (ref 4.0–10.5)
nRBC: 0 % (ref 0.0–0.2)

## 2019-01-06 LAB — SODIUM
Sodium: 160 mmol/L — ABNORMAL HIGH (ref 135–145)
Sodium: 163 mmol/L (ref 135–145)

## 2019-01-06 LAB — GLUCOSE, CAPILLARY
Glucose-Capillary: 184 mg/dL — ABNORMAL HIGH (ref 70–99)
Glucose-Capillary: 169 mg/dL — ABNORMAL HIGH (ref 70–99)

## 2019-01-06 LAB — ECHOCARDIOGRAM COMPLETE
Height: 68 in
Weight: 2920.65 oz

## 2019-01-06 MED ORDER — FREE WATER: 200.0000 mL | Status: DC

## 2019-01-06 MED ORDER — MIDAZOLAM HCL 2 MG/2ML IJ SOLN: 2.0000 mg | INTRAMUSCULAR | Status: DC | PRN

## 2019-01-06 MED ORDER — ACETAMINOPHEN 325 MG PO TABS: 650.0000 mg | ORAL_TABLET | Freq: Four times a day (QID) | ORAL | Status: DC | PRN

## 2019-01-06 MED ORDER — GLYCOPYRROLATE 0.2 MG/ML IJ SOLN: 0.2000 mg | INTRAMUSCULAR | Status: DC | PRN

## 2019-01-06 MED ORDER — ACETAMINOPHEN 650 MG RE SUPP: 650.0000 mg | Freq: Four times a day (QID) | RECTAL | Status: DC | PRN

## 2019-01-06 MED ORDER — FREE WATER
300.0000 mL | Status: DC
  Administered 2019-01-06: 300 mL

## 2019-01-06 MED ORDER — GLYCOPYRROLATE 1 MG PO TABS: 1.0000 mg | ORAL_TABLET | ORAL | Status: DC | PRN

## 2019-01-06 MED ORDER — MORPHINE SULFATE (PF) 2 MG/ML IV SOLN: 2.0000 mg | INTRAVENOUS | Status: DC | PRN

## 2019-01-06 MED ORDER — MORPHINE 100MG IN NS 100ML (1MG/ML) PREMIX INFUSION
0.0000 mg/h | INTRAVENOUS | Status: DC
  Administered 2019-01-06: 10 mg/h via INTRAVENOUS
  Administered 2019-01-06: 5 mg/h via INTRAVENOUS
  Administered 2019-01-07: 10 mg/h via INTRAVENOUS
  Filled 2019-01-06 (×3): qty 100

## 2019-01-06 MED ORDER — POLYVINYL ALCOHOL 1.4 % OP SOLN
1.0000 [drp] | Freq: Four times a day (QID) | OPHTHALMIC | Status: DC | PRN
  Filled 2019-01-06: qty 15

## 2019-01-06 MED ORDER — MORPHINE BOLUS VIA INFUSION
5.0000 mg | INTRAVENOUS | Status: DC | PRN
  Filled 2019-01-06: qty 5

## 2019-01-06 NOTE — Progress Notes (Signed)
SLP Cancellation Note  Patient Details Name: MONIA TIMMERS MRN: [id number] DOB: 1933-04-15   Cancelled treatment:       Reason Eval/Treat Not Completed: Medical issues which prohibited therapy(Pt's family has decided on comfort care. SLP will sign off at this time.)  Aydyn Testerman I. Hardin Negus, [place], [place] Office number [phone] Pager [place] 01/06/2019, 7:51 AM

## 2019-01-06 NOTE — Progress Notes (Signed)
PT Cancellation Note  Patient Details Name: [name] [name] MRN: [id number] DOB: Dec 25, 1932   Cancelled Treatment:    Reason Eval/Treat Not Completed: Medical issues which prohibited therapy. Pt now comfort care. Pt with no further acute PT needs at this time.   Kittie Plater, PT, DPT Acute Rehabilitation Services Pager #: [phone] Office #: [phone]    Berline Lopes 01/06/2019, 6:36 AM

## 2019-01-06 NOTE — Progress Notes (Signed)
Chaplain met family in waiting area. Two sons were there.  Chaplain consulted with nurse, who said she was ready for family to come back.  Two daughters arrived and the four went back to be bedside with mother, led by chaplain.  Chaplain offered brief words of comfort, scripture and prayer.  Daughter said, "We are ready now. Please tell the nurse." Chaplain will continue to be available if needed. Rev. Tamsen Snider Pager [phone]

## 2019-01-06 NOTE — Procedures (Signed)
Extubation Procedure Note  Pt extubated per MD order.  Family & RN at bedside.  Ciro Backer 01/06/2019, 12:07 PM

## 2019-01-06 NOTE — Progress Notes (Addendum)
OT DIscharge Note  Patient Details Name: [name] [name] MRN: [id number] DOB: Aug 29, 1932   Cancelled Treatment:    Reason Eval/Treat Not Completed: Medical issues which prohibited therapy.  Noted, pt now for comfort care and withdrawal of vent  Will sign off.  Lucille Passy, OTR/L Acute Rehabilitation Services Pager [phone] Office [phone]   Lucille Passy M 01/06/2019, 6:11 AM

## 2019-01-06 NOTE — Progress Notes (Addendum)
STROKE TEAM PROGRESS NOTE   SUBJECTIVE (INTERVAL HISTORY) Her RN is at the bedside. Pt still intubated not following commands. Neuro worsened today with less responsive and movement on the left arm. Pupil bigger on the left. Current code status DNR. Likely comfort care today.   OBJECTIVE Vitals:   01/06/19 0755 01/06/19 0800 01/06/19 0801 01/06/19 0900  BP:  (!) 218/69  (!) 142/58  Pulse: 63 62 69 (!) 59  Resp:  17 (!) 23 16  Temp:  99.7 F (37.6 C)  99.9 F (37.7 C)  TempSrc:      SpO2:  100% 100% 100%  Weight:      Height:        CBC:  Recent Labs  Lab 01-31-2019 0740  01/05/19 0634 01/06/19 0710  WBC 7.5   < > 12.2* 11.8*  NEUTROABS 5.6  --   --   --   HGB 11.2*   < > 12.5 13.0  HCT 35.7*   < > 41.5 44.4  MCV 81.5   < > 83.8 85.7  PLT 220   < > 210 196   < > = values in this interval not displayed.    Basic Metabolic Panel:  Recent Labs  Lab 01/05/19 0634 01/05/19 1109  01/06/19 0102 01/06/19 0710  NA 155* 157*   < > 160*  160* 163*  K 3.2*  --   --  3.7  --   CL 120*  --   --  126*  --   CO2 21*  --   --  23  --   GLUCOSE 226*  --   --  201*  --   BUN 46*  --   --  52*  --   CREATININE 1.14*  --   --  1.35*  --   CALCIUM 9.7  --   --  10.1  --   MG 2.2 2.3  --   --   --   PHOS 2.6 2.4*  --  2.1*  --    < > = values in this interval not displayed.    Lipid Panel:     Component Value Date/Time   CHOL 144 01/04/2019 0210   CHOL 163 08/11/2018 1332   TRIG 69 01/04/2019 0210   TRIG 120 02/20/2013 1310   TRIG 101 12/11/2009   HDL 63 01/04/2019 0210   HDL 73 08/11/2018 1332   HDL 65 02/20/2013 1310   CHOLHDL 2.3 01/04/2019 0210   VLDL 14 01/04/2019 0210   LDLCALC 67 01/04/2019 0210   LDLCALC 69 08/11/2018 1332   LDLCALC 69 02/20/2013 1310   HgbA1c:  Lab Results  Component Value Date   HGBA1C 8.3 (H) 01/04/2019   Urine Drug Screen: No results found for: LABOPIA, COCAINSCRNUR, LABBENZ, AMPHETMU, THCU, LABBARB  Alcohol Level No results found for:  ETH  IMAGING  Ct Angio Head W Or Wo Contrast Ct Angio Neck W And/or Wo Contrast Ct Cerebral Perfusion W Contrast  January 31, 2019 IMPRESSION:  1. Large left MCA territory infarct. Core infarct volume is 92 mL.  2. Partially occlusive thrombus in the proximal left M1 and A1 segments. This may represent some reperfusion from earlier occlusion.  3. Atherosclerotic changes within the cavernous internal carotid arteries bilaterally.  4. Left ICA stenosis is less than 50%.  5. Higher grade right proximal ICA stenosis measuring 75-80%  6. High-grade stenosis at the origin of the dominant left vertebral artery.  7. Moderate stenoses of the right P2 segment  of the posterior cerebral artery.  8. Atrophy of the submandibular glands bilaterally.   Ct Head Wo Contrast 01/04/2019 IMPRESSION:  Slightly increased cytotoxic edema within the left MCA distribution. No hemorrhagic conversion.   Ct Head Wo Contrast 12/11/2018 \\IMPRESSION :  1. Large acute left MCA territory infarct. ASPECTS is 3.  2. Hyperdense left M1 and A1 segments.  No evidence of ACA ischemia. 3. No hemorrhagic transformation.   Transthoracic Echocardiogram  Pending  ECG - paced rate 76 BPM   PHYSICAL EXAM  Temp:  [98.8 F (37.1 C)-100.4 F (38 C)] 99.9 F (37.7 C) (07/28 0900) Pulse Rate:  [59-88] 59 (07/28 0900) Resp:  [15-23] 16 (07/28 0900) BP: (142-221)/(54-175) 142/58 (07/28 0900) SpO2:  [100 %] 100 % (07/28 0900) FiO2 (%):  [30 %] 30 % (07/28 0801)  General - Well nourished, well developed, intubated off sedation one hour ago.  Ophthalmologic - fundi not visualized due to noncooperation.  Cardiovascular - Regular rate and rhythm.  Neuro - intubated just off propofol, eyes closed, not following commands. With forced eye opening, eyes right gaze preference, no doll's eyes, not blinking to visual threat bilaterally, not tracking, left pupil 3mm and left pupil 2mm, not reactive to light. Corneal reflex absent on the  right, very weak on the left, gag and cough weakly present. Breathing over the vent.  Facial symmetry not able to test due to ET tube.  Tongue protrusion not cooperative. LUE no spontaneous movement today, slight withdraw with pain, no movement of RUE, slight withdraw LLE and no movement RLE on pain. DTR 1+ and bilateral positive babinski. Sensation, coordination and gait not tested.   ASSESSMENT/PLAN [name] is a 85 y.o. female with history of CHF, CAD, COPD, dementia, hypothyroidism,diabetes, hypertension who was found with right-sided weakness and was brought to the emergency department [facility]. Unfortunately, she aspirated and became hypoxemic necessitating intubation. She did not receive IV t-PA due to late presentation (>4.5 hours from time of onset). Not felt to be a thrombectomy candidate due to low aspect score and large volume of infarct on CT perfusion  Stroke: Large acute left MCA infarct - embolic - source unclear.  Resultant  Left gaze, right hemiplegia, not following commands  CT head - large infarct at left MCA distribution.  MRI head  - not ordered - pacemaker  CTA H&N - Partially occlusive thrombus in the proximal left M1 and A1 segments. High-grade right proximal ICA stenosis 75-80%. High-grade stenosis at the origin of the dominant left VA.   CTP - Large left MCA territory infarct. Core infarct volume is 92 mL.   CT repeat - Slightly increased cytotoxic edema within the left MCA distribution  2D Echo - pending  Hold off pacer interrogation as it will not change management   EEG - potential epileptogenicity in right frontotemporal region as well as cortical dysfunction in the same region. There is also evidence of moderate to severe diffuse encephalopathy. No seizures were seen throughout the recording.  Sars Corona Virus 2 - negative  LDL - 67  HgbA1c - 8.3  VTE prophylaxis - Lovenox  Diet - NPO  aspirin 81 mg daily prior to admission,  now on aspirin 325 mg daily.  Patient will be counseled to be compliant with her antithrombotic medications  Ongoing aggressive stroke risk factor management  Code status - DNR  Prognosis is poor, unlikely to have meaningful recover, palliative care on board, pending decision on comfort care measures.  Cerebral edema Uncal herniation  CT repeat showed increased cerebral edema  Now off 3% saline  Off NS @ 50  Now put on free water 300ml Q4h  Na 148->155->159->155->160->163  Na Q6h  Na goal 150-155  Currently anisocoria likely early uncal herniation  Prognosis is poor, agree with comfort care discussion with APS   Respiratory failure  Aspiration pneumonia  Respiratory decline likely due to aspiration  Intubated on vent  CCM on board  On Rocephin  Afebrile->100.4  Leukocytosis 9.2->12.2->11.8  Hypertension  Stable on the high end  Could be associated with increased ICP  Put back on home meds with coreg and lisinopril . Gradually normalize in 3-5 days . Long-term BP goal normotensive  Hyperlipidemia  Lipid lowering medication PTA: Pravachol 80 mg daily  LDL 67, goal < 70  Current lipid lowering medication: Pravastatin 80 mg daily   Continue statin at discharge  Diabetes  HgbA1c 8.3, goal < 7.0  Uncontrolled  SSI  Hyperglycemia improved  CBG monitoring  CCM on board  Social issue  Pt has 3 children but now taking care by APS  Discussed with APS direction Sunday over the phone  Now DNR status  Her SW in APS is Ms. Tenny Crawoss [phone]  APS director personal line [phone]  Palliative care on board, agree with comfort care discussion  Other Stroke Risk Factors  Advanced age  Coronary artery disease  CHF  Obstructive sleep apnea   Other Active Problems  Pacemaker (Medtronic)   Hypokalemia - 3.3 ->3.2 supplemented  Creatinine - 1.13->1.14->1.35 - on free water 300 Q4  Anemia of chronic disease - Hb -  10.8->12.5->13.0  Hospital day # 3  This patient is critically ill due to large left MCA infarct, respiratory failure, aspiration and at significant risk of neurological worsening, death form cerebral edema, brain herniation, fever, sepsis, seizure. This patient's care requires constant monitoring of vital signs, hemodynamics, respiratory and cardiac monitoring, review of multiple databases, neurological assessment, discussion with family, other specialists and medical decision making of high complexity. I spent 35 minutes of neurocritical care time in the care of this patient. I also discussed with CCM NP.   [name] PlanJindong Kaliope Quinonez, MD PhD Stroke Neurology 01/06/2019 9:31 AM   To contact Stroke Continuity provider, please refer to [email or web address]. After hours, contact General Neurology

## 2019-01-06 NOTE — Progress Notes (Signed)
NAME:  [name] [name], MRN:  [id number], DOB:  21-Jul-1932, LOS: 3 ADMISSION DATE:  12/15/2018, CONSULTATION DATE:  01/04/19 CHIEF COMPLAINT:  CVA   Brief History   This is a 85 yo female with history of chronic CHF, CAD, COPD, Dementia, HTN, and DM2. Patient apparently lives at assisted living facility. Here for further management of CVA and hypoxic respiratory failure.   History of present illness   This is a 85 yo female with history of chronic CHF, CAD, COPD, Dementia, HTN, and DM2. Patient apparently lives at assited living facility. She can ambulate at baseline per daughter. Staff noted patient to be more lethargic and flacid right arm. Brought into ED for stroke. Got CT head that confirmed Right MCA infarct. Got CT angiogram as well. Apparently while on way to CT patient aspirated. Had respiratory failure with sats around 65% and got intubated.  Past Medical History  CHF HTN CAD COPD DM2 Dementia  Significant Hospital Events   7/25 admitted/ intubated  7/26 3% saline 7/27 DNR per APS  Consults:  Neurology  Procedures:  7/25 ETT >>  Significant Diagnostic Tests:  CT on 7/25 as noted below:  IMPRESSION: 1. Large left MCA territory infarct.  Core infarct volume is 92 mL. 2. Partially occlusive thrombus in the proximal left M1 and A1 segments. This may represent some reperfusion from earlier occlusion. 3. Atherosclerotic changes within the cavernous internal carotid arteries bilaterally. 4. Left ICA stenosis is less than 50%. 5. Higher grade right proximal ICA stenosis measuring 75-80% 6. High-grade stenosis at the origin of the dominant left vertebral artery. 7. Moderate stenoses of the right P2 segment of the posterior cerebral artery. 8. Atrophy of the submandibular glands bilaterally.  7/26 CTH >> Slightly increased cytotoxic edema within the left MCA distribution. No hemorrhagic conversion.  7/26 TTE >>  Micro Data:  COVID 19 negative  Antimicrobials:   7/25 ceftriaxone >>  Interim history/subjective:  Changed to DNR status per approved APS paperwork.  Since, APS has sent paperwork this morning with court documents which approve the  withdrawal of life support as family support decision and state patient would not want this.     Awaiting for family members to arrive to transition to comfort care/ one-way extubation   Objective   Blood pressure (!) 193/73, pulse (!) 59, temperature 99.9 F (37.7 C), resp. rate 16, height 5\' 8"  (1.727 m), weight 79.9 kg, SpO2 100 %.    Vent Mode: PRVC FiO2 (%):  [30 %] 30 % Set Rate:  [16 bmp] 16 bmp Vt Set:  [510 mL] 510 mL PEEP:  [5 cmH20] 5 cmH20 Pressure Support:  [8 cmH20] 8 cmH20 Plateau Pressure:  [14 cmH20-15 cmH20] 15 cmH20   Intake/Output Summary (Last 24 hours) at 01/06/2019 1033 Last data filed at 01/06/2019 1009 Gross per 24 hour  Intake 1373.01 ml  Output 1365 ml  Net 8.01 ml   Filed Weights   01/04/19 0000 01/04/19 0343 01/05/19 0500  Weight: 82.8 kg 82.8 kg 79.9 kg    Examination:  General:  Chronically ill appearing elderly female in NAD, not currently on sedation HEENT: MM pink/moist, pupils 4/reactive, ETT/ OGT Neuro: unresponsive to noxious stimuli CV: paced, pacer left upper chest PULM:  Clear anteriorly diminished in bases GI: obese, soft, bs active  Extremities: warm/dry, no edema  Skin: no rashes   Resolved Hospital Problem list   NA  Assessment & Plan:   Left MCA CVA with cytotoxic edema Hypoxic respiratory failure  2/2 aspiration DM2 HTN Hypothyroidism COPD HFrEF-Patient with EF of 35-40% and diffuse hypokinesis H/o arrhythmia/sick sinus syndrome--status post Medtronic dual-chamber pacemaker placement History of ACS P:  Appreciate assistance by palliative care medicine DNR/ DNI - received court approved papers by APS this morning approving withdrawal of life support as patient is ward of the state.  Family has been called in, spoke with daughter  Hoyle Sauer.  Withdrawal of life support orders in - will start morphine gtt for comfort, prn versed and robinul and allow family time to visit and then proceede with one-way extubation.   ++ will need to notify patient's case manager, Marinda Elk at [phone], at the time of extubation/ withdrawal of care.     Best practice:  Diet: npo  Pain/Anxiety/Delirium protocol (if indicated): adding comfort orders VAP protocol (if indicated): yes DVT prophylaxis: d/c lovenox GI prophylaxis: d/c Pepcid  Glucose control: d/c  Mobility: NA Code Status: DNR Family Communication: daughter, Hoyle Sauer called, family called in, arriving soon Disposition: ICU  Labs   CBC: Recent Labs  Lab 12/11/2018 0740 01/04/19 0210 01/05/19 0449 01/05/19 0634 01/06/19 0710  WBC 7.5 9.2  --  12.2* 11.8*  NEUTROABS 5.6  --   --   --   --   HGB 11.2* 10.8* 12.6 12.5 13.0  HCT 35.7* 34.7* 37.0 41.5 44.4  MCV 81.5 80.7  --  83.8 85.7  PLT 220 237  --  210 643    Basic Metabolic Panel: Recent Labs  Lab 12/24/2018 0740 12/19/2018 0741 01/04/19 0210  01/05/19 0449 01/05/19 0634 01/05/19 1109 01/05/19 1857 01/06/19 0102 01/06/19 0710  NA 134*  --  140   < > 159* 155* 157* 158* 160*  160* 163*  K 3.7  --  3.3*  --  3.1* 3.2*  --   --  3.7  --   CL 94*  --  99  --   --  120*  --   --  126*  --   CO2 29  --  24  --   --  21*  --   --  23  --   GLUCOSE 254*  --  276*  --   --  226*  --   --  201*  --   BUN 73*  --  63*  --   --  46*  --   --  52*  --   CREATININE 1.16* 1.20* 1.13*  --   --  1.14*  --   --  1.35*  --   CALCIUM 9.6  --  9.7  --   --  9.7  --   --  10.1  --   MG  --   --  1.9  --   --  2.2 2.3  --   --   --   PHOS  --   --  4.7*  --   --  2.6 2.4*  --  2.1*  --    < > = values in this interval not displayed.   GFR: Estimated Creatinine Clearance: 34.4 mL/min (A) (by C-G formula based on SCr of 1.35 mg/dL (H)). Recent Labs  Lab 01/02/2019 0740 01/04/19 0210 01/05/19 0634 01/06/19 0710  WBC 7.5  9.2 12.2* 11.8*    Liver Function Tests: Recent Labs  Lab 12/10/2018 0740 01/06/19 0102  AST 20  --   ALT 12  --   ALKPHOS 99  --   BILITOT 0.6  --   PROT 7.9  --  ALBUMIN 3.7 3.2*   No results for input(s): LIPASE, AMYLASE in the last 168 hours. No results for input(s): AMMONIA in the last 168 hours.  ABG    Component Value Date/Time   PHART 7.468 (H) 01/05/2019 0449   PCO2ART 35.1 01/05/2019 0449   PO2ART 122.0 (H) 01/05/2019 0449   HCO3 25.3 01/05/2019 0449   TCO2 26 01/05/2019 0449   O2SAT 99.0 01/05/2019 0449     Coagulation Profile: Recent Labs  Lab 10/09/2018 0740  INR 1.1    Cardiac Enzymes: No results for input(s): CKTOTAL, CKMB, CKMBINDEX, TROPONINI in the last 168 hours.  HbA1C: HB A1C (BAYER DCA - WAIVED)  Date/Time Value Ref Range Status  06/25/2018 11:48 AM 7.1 (H) <7.0 % Final    Comment:                                          Diabetic Adult            <7.0                                       Healthy Adult        4.3 - 5.7                                                           (DCCT/NGSP) American Diabetes Association's Summary of Glycemic Recommendations for Adults with Diabetes: Hemoglobin A1c <7.0%. More stringent glycemic goals (A1c <6.0%) may further reduce complications at the cost of increased risk of hypoglycemia.   05/12/2018 12:48 PM 6.5 <7.0 % Final    Comment:                                          Diabetic Adult            <7.0                                       Healthy Adult        4.3 - 5.7                                                           (DCCT/NGSP) American Diabetes Association's Summary of Glycemic Recommendations for Adults with Diabetes: Hemoglobin A1c <7.0%. More stringent glycemic goals (A1c <6.0%) may further reduce complications at the cost of increased risk of hypoglycemia.    Hgb A1c MFr Bld  Date/Time Value Ref Range Status  01/04/2019 02:10 AM 8.3 (H) 4.8 - 5.6 % Final    Comment:    (NOTE)  Pre diabetes:          5.7%-6.4% Diabetes:              >6.4% Glycemic control for   <  7.0% adults with diabetes     CBG: Recent Labs  Lab 01/05/19 1554 01/05/19 1957 01/05/19 2304 01/06/19 0322 01/06/19 0800  GLUCAP 179* 164* 168* 184* 169*     Critical care time: 30 mins     Posey BoyerBrooke Simpson, MSN, AGACNP-BC [place] Pgr: [phone] or if no answer [phone] 01/06/2019, 10:33 AM

## 2019-01-07 ENCOUNTER — Telehealth: Payer: Self-pay | Admitting: *Deleted

## 2019-01-10 NOTE — Telephone Encounter (Signed)
Received original D/C from [facility] , Notified funeral home for pick up along with faxing a copy.

## 2019-01-10 NOTE — Telephone Encounter (Signed)
Original D/C Mailed to funeral home as requested

## 2019-01-10 NOTE — Progress Notes (Signed)
0725: 100 cc of morphine wasted with Susie RN in sink. Lianne Bushy RN BSN

## 2019-01-10 DEATH — deceased

## 2019-01-12 ENCOUNTER — Ambulatory Visit: Payer: Medicare HMO | Admitting: Family

## 2019-03-12 NOTE — Death Summary Note (Signed)
  DEATH SUMMARY   Patient Details  Name: CALLAHAN PEDDIE MRN: [id number] DOB: May 22, 1933  Admission/Discharge Information   Admit Date:  01-04-2019  Date of Death: Date of Death: 2019-01-08  Time of Death: Time of Death: 0619  Length of Stay: 4  Referring Physician: Sharion Balloon, FNP   Reason(s) for Hospitalization  Lethargy and right hemiparesis  Diagnoses  Preliminary cause of death:  Secondary Diagnoses (including complications and co-morbidities):  Principal Problem:   Acute ischemic left MCA stroke [facility]) Active Problems:   Hypothyroid   Hypertension associated with diabetes ([place])   Chronic obstructive pulmonary disease ([place])   PPM-Medtronic   Type II diabetes mellitus with peripheral autonomic neuropathy ([facility])   Chronic combined systolic (congestive) and diastolic (congestive) heart failure ([place])   Hemi-neglect of right side   CVA (cerebral vascular accident) ([place])   Aspiration into airway Dementia Coronary artery disease  Brief Hospital Course (including significant findings, care, treatment, and services provided and events leading to death)  NYELAH EMMERICH is a 85 y.o. year old female with history of dementia, coronary artery disease, hypertension, chronic systolic and diastolic CHF, diabetes, COPD.  She was admitted on 2019-01-04 with an acute left MCA stroke, encephalopathy.  Course complicated by aspiration and resultant aspiration pneumonia, acute respiratory failure requiring intubation and mechanical ventilation.  She received all aggressive support including hypertonic saline, unfortunately continued to show signs of significant neurological injury with associated cerebral edema and uncal herniation.  She did not have any significant neurologic improvement after serial exams.  The patient was a ward of the state and discussions were undertaken both with Adult Protective Services and also her family regarding failure to improve, possible transition to comfort care.  All  agreed.  Clearance was given by APS and compassionate extubation was performed on 01/06/2019.  She expired on 2019-01-08.   Rose Fillers Brady Schiller 02/18/2019, 3:51 PM

## 2019-10-16 IMAGING — DX DG CHEST 2V
2 series · 2 of 2 positions shown · non-contrast
Comparison: 06/27/2017

CLINICAL DATA: Dyspnea and cough today.

EXAM:
CHEST  2 VIEW

[chest lat]
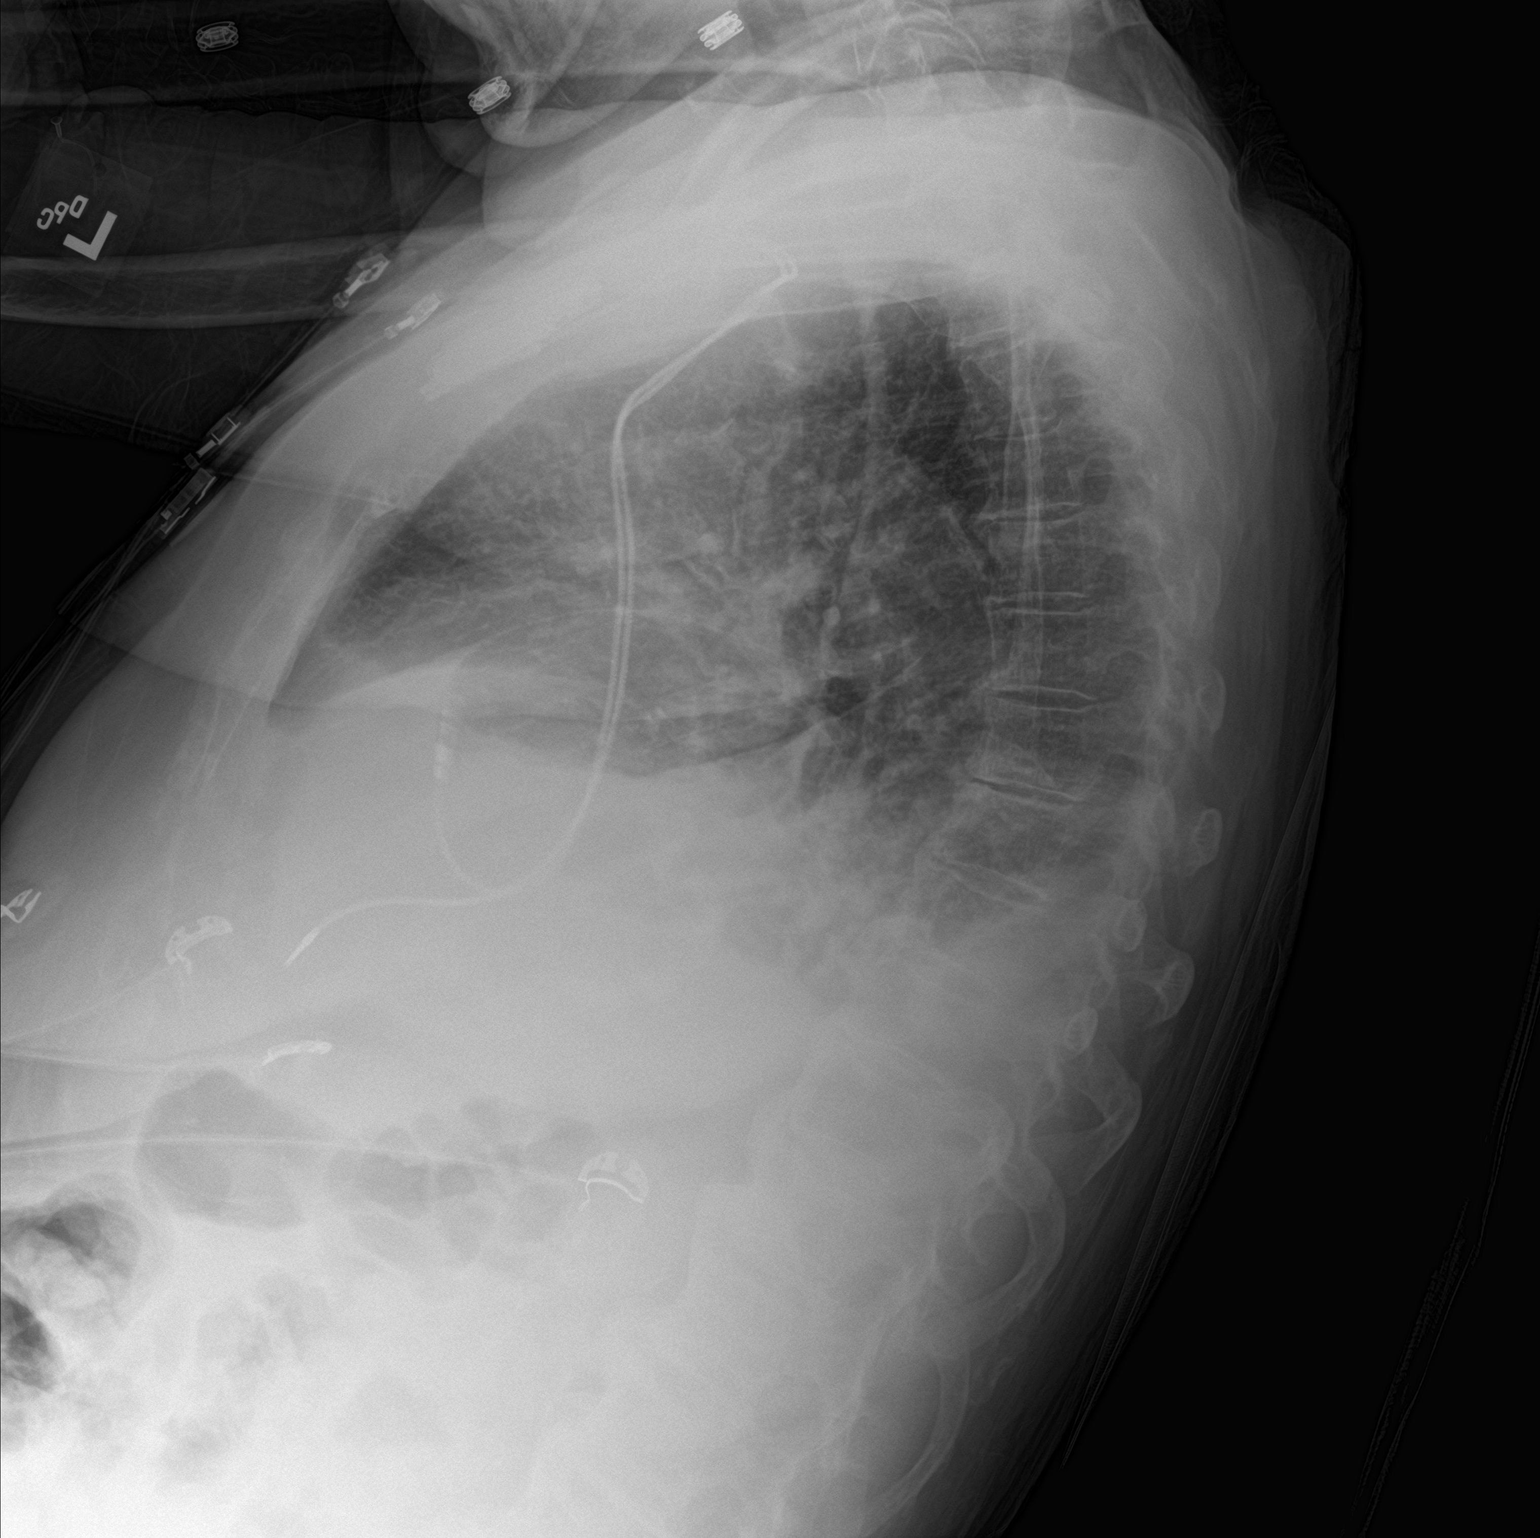

[chest ap]
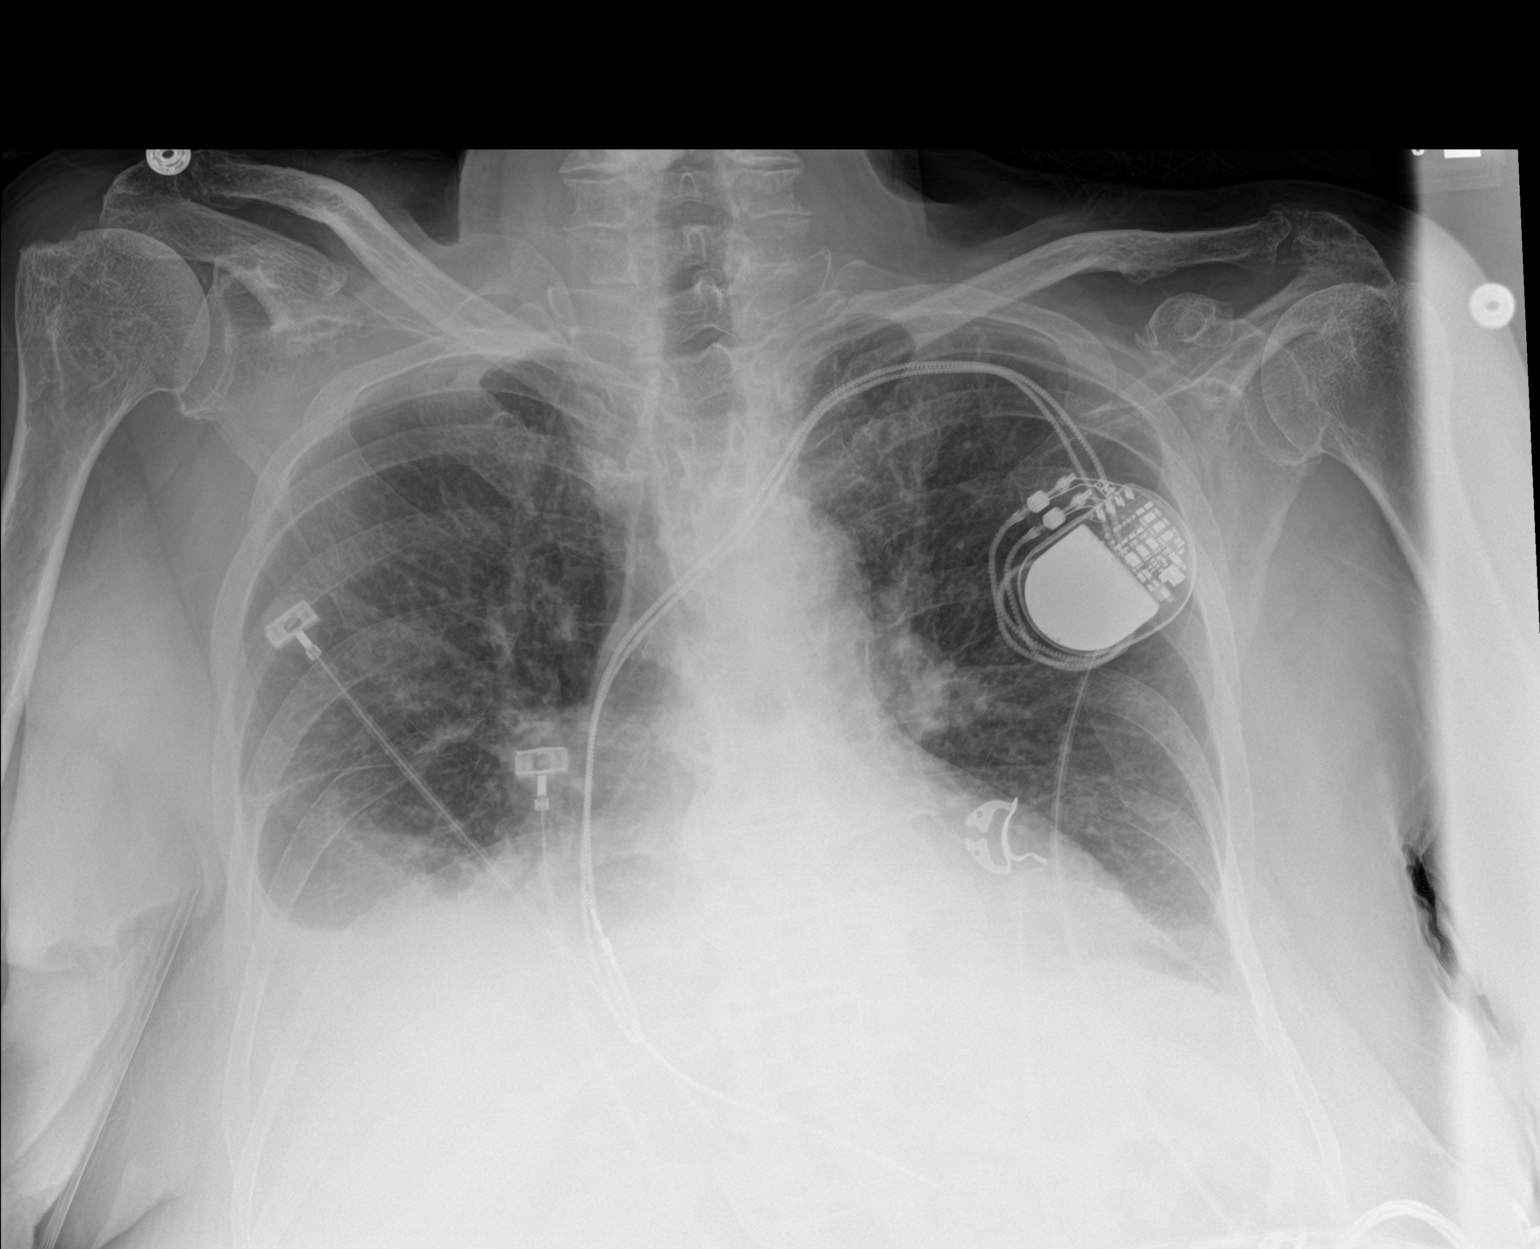

[2 of 2 positions shown; findings below may reference images not displayed]

FINDINGS: Heart and mediastinal contours are stable with aortic
atherosclerosis. No aneurysm. Moderate right and trace left
effusions are stable. Right atrial and right ventricular leads are
noted with left-sided pacemaker apparatus projecting over the left
upper lobe. Mild interstitial edema is stable. No acute osseous
abnormality. Osteoarthritis of the AC and glenohumeral joints
bilaterally.
IMPRESSION: 1. Chronic stable mild interstitial edema with moderate right and
trace left pleural effusions.
2. Aortic atherosclerosis.

## 2019-10-17 IMAGING — CT CT HEAD W/O CM
3 series · 15 of 47 positions shown, 18 images · non-contrast
Comparison: Prior CT from 12/10/2009.

CLINICAL DATA: Initial evaluation for acute altered mental status,
difficulty waking of.

EXAM:
CT HEAD WITHOUT CONTRAST
TECHNIQUE: Contiguous axial images were obtained from the base of the skull
through the vertex without intravenous contrast.

[Series 2: head trauma wo · axial · 0.45mm/px · z∈[+25,+160]mm · 9 of 33 slices shown, 12 images]
[im 3/33  brain]
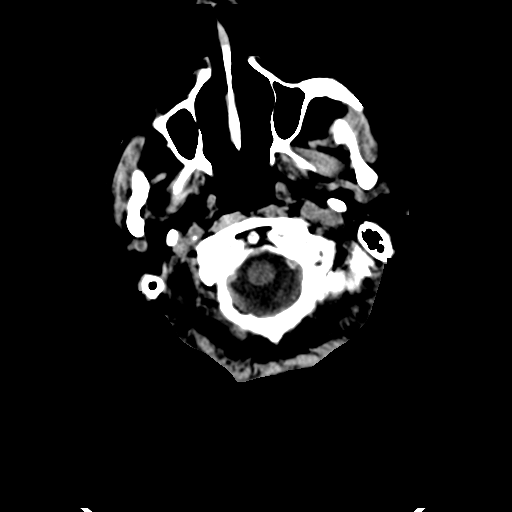
[im 3/33  bone]
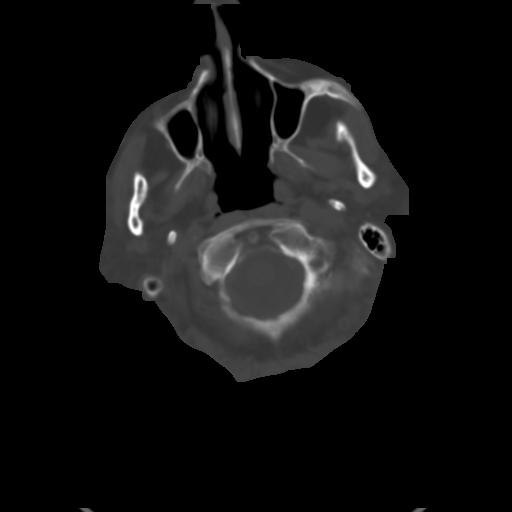
[im 6/33  brain]
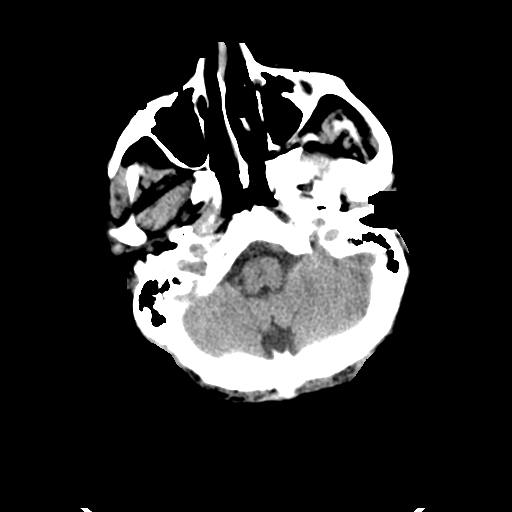
[im 9/33  brain]
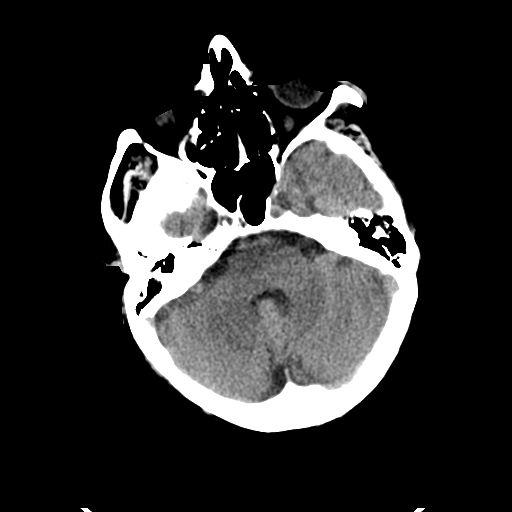
[im 13/33  brain]
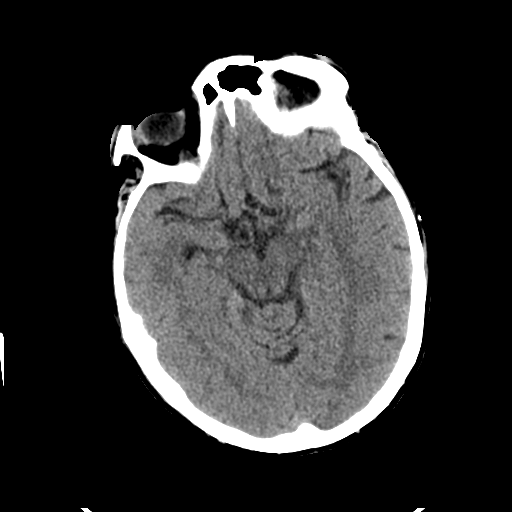
[im 17/33  brain]
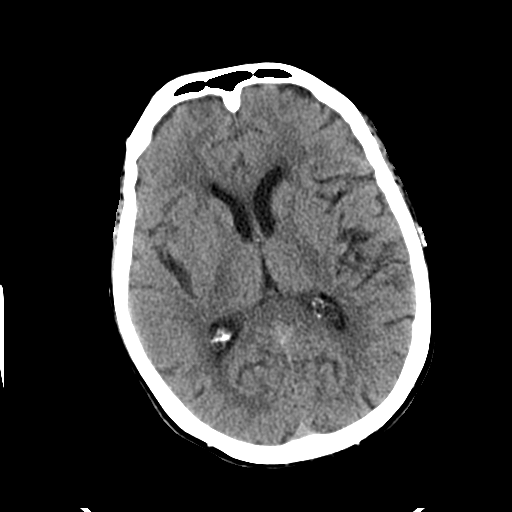
[im 17/33  bone]
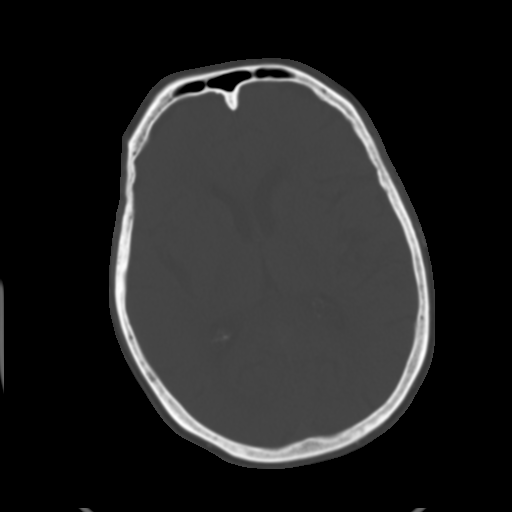
[im 20/33  brain]
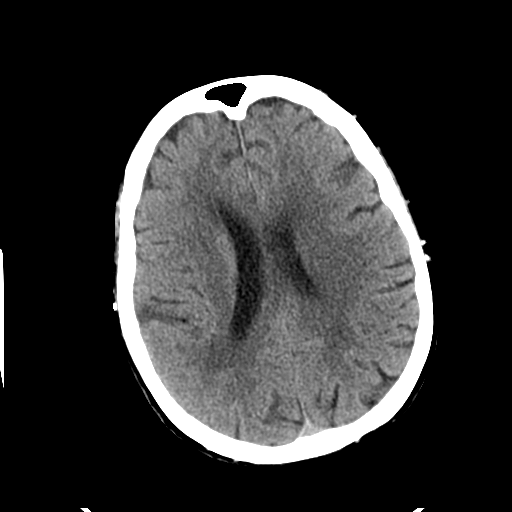
[im 24/33  brain]
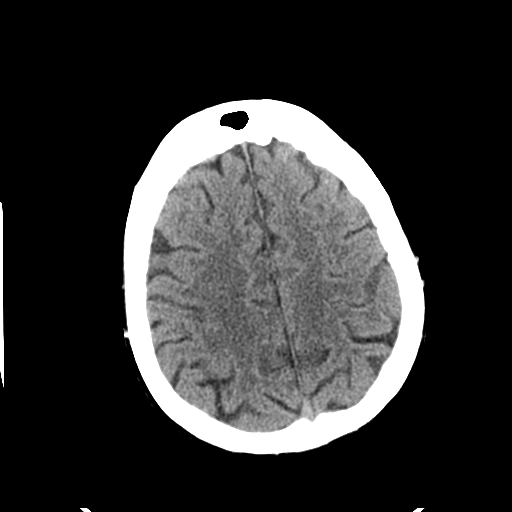
[im 27/33  brain]
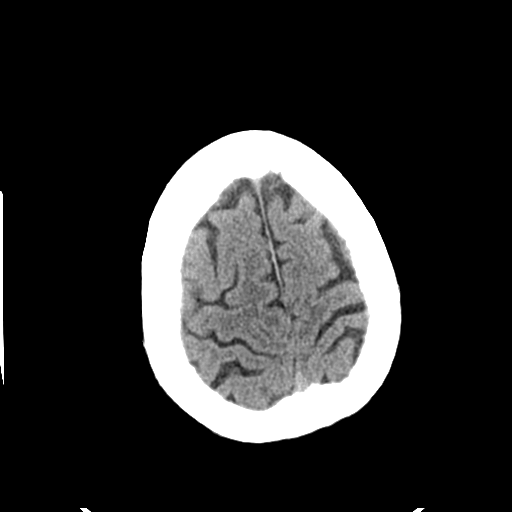
[im 30/33  brain]
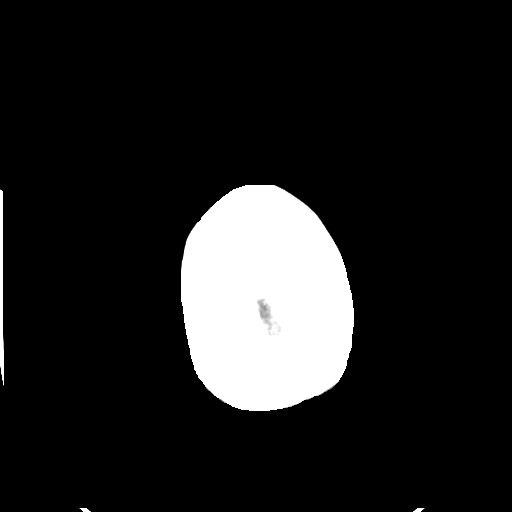
[im 30/33  bone]
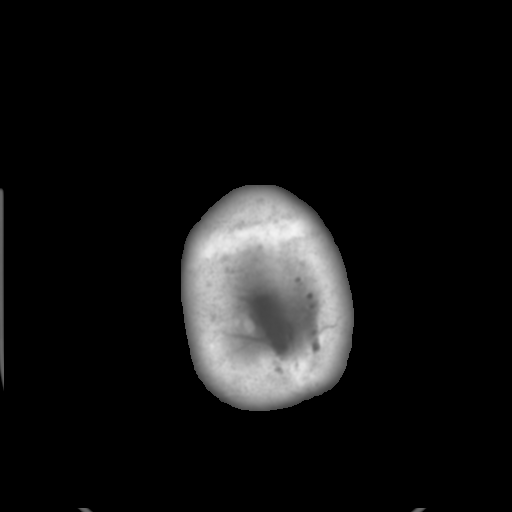

[Series 4: coronal soft tissue · coronal · 0.33mm/px · 3 of 69 slices shown]
[im 23/69  brain]
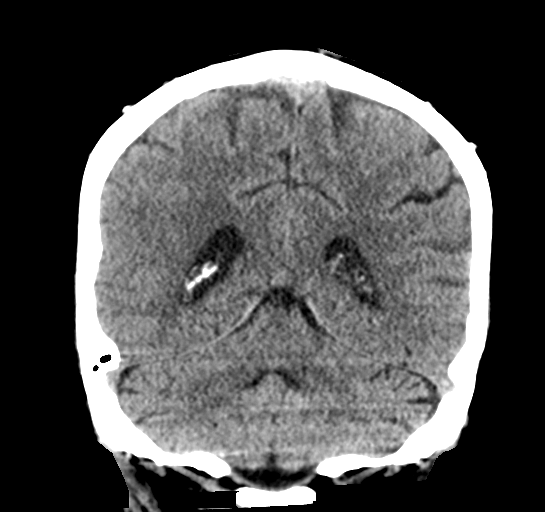
[im 31/69  brain]
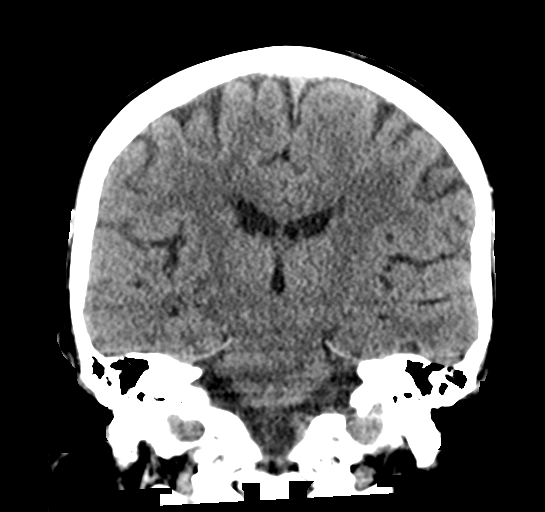
[im 38/69  brain]
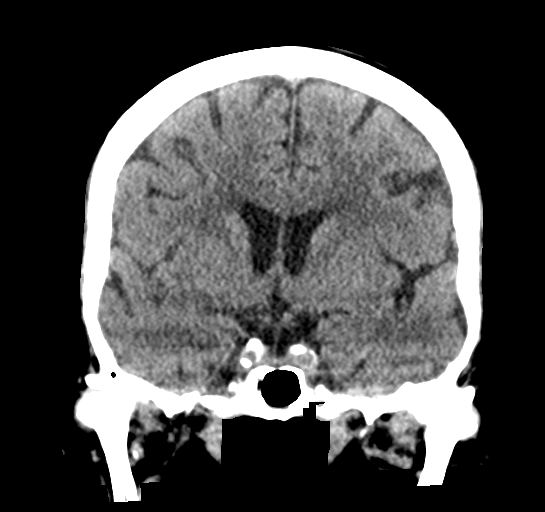

[Series 5: sagittal soft tissue · sagittal · 0.32mm/px · 3 of 53 slices shown]
[im 18/53  brain]
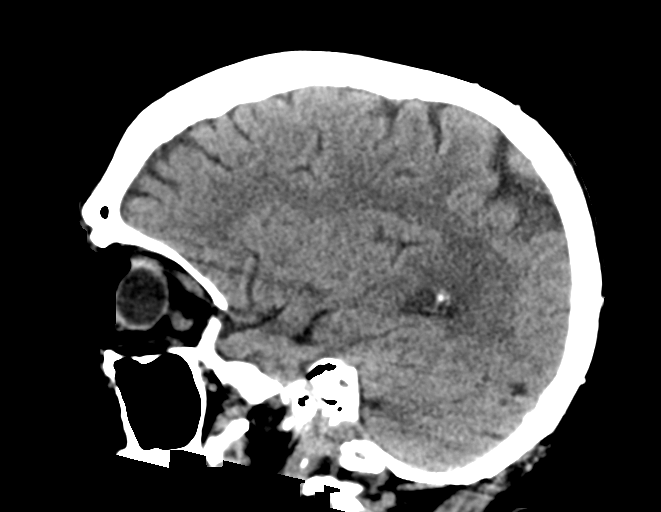
[im 27/53  brain]
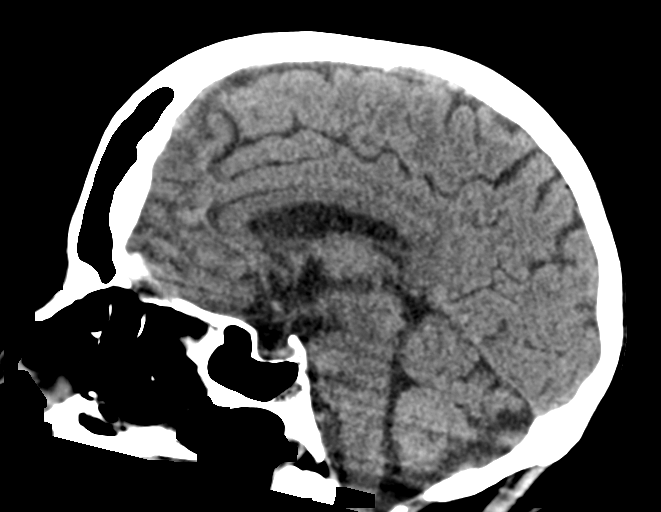
[im 35/53  brain]
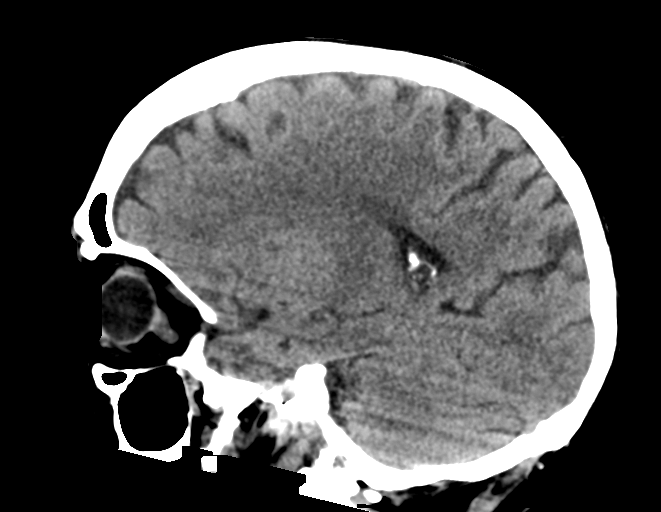

[15 of 47 positions shown; findings below may reference images not displayed]

FINDINGS: Brain: Generalized age appropriate cerebral atrophy. Mild chronic
microvascular ischemic changes present within the supratentorial
cerebral white matter. No acute intracranial hemorrhage. No evidence
for acute large vessel territory infarct. No mass lesion, midline
shift or mass effect. No hydrocephalus. No extra-axial fluid
collection.

Vascular: No hyperdense vessel. Scattered vascular calcifications
noted within the carotid siphons.

Skull: Scalp soft tissues and calvarium within normal limits.

Sinuses/Orbits: Globes and orbital soft tissues normal. Paranasal
sinuses and mastoid air cells are clear.

Other: None.
IMPRESSION: 1. No acute intracranial abnormality.
2. Mild chronic microvascular ischemic disease.

## 2020-05-17 ENCOUNTER — Other Ambulatory Visit: Payer: Self-pay | Admitting: Family

## 2020-08-16 ENCOUNTER — Telehealth: Payer: Self-pay

## 2020-08-16 NOTE — Telephone Encounter (Signed)
error 

## 2021-11-23 ENCOUNTER — Encounter: Payer: Self-pay | Admitting: Internal Medicine

## 2022-07-27 ENCOUNTER — Other Ambulatory Visit (HOSPITAL_COMMUNITY): Payer: Self-pay
